# Patient Record
Sex: Female | Born: 1955 | Race: Black or African American | Hispanic: No | Smoking: Former smoker
Health system: Southern US, Community
[De-identification: ages and names within clinical notes are randomized; demographics above are authoritative.]

## PROBLEM LIST (undated history)

## (undated) DIAGNOSIS — R011 Cardiac murmur, unspecified: Secondary | ICD-10-CM

## (undated) DIAGNOSIS — E785 Hyperlipidemia, unspecified: Secondary | ICD-10-CM

## (undated) DIAGNOSIS — Z923 Personal history of irradiation: Secondary | ICD-10-CM

## (undated) DIAGNOSIS — Z86018 Personal history of other benign neoplasm: Secondary | ICD-10-CM

## (undated) DIAGNOSIS — Z9289 Personal history of other medical treatment: Secondary | ICD-10-CM

## (undated) DIAGNOSIS — N950 Postmenopausal bleeding: Secondary | ICD-10-CM

## (undated) DIAGNOSIS — R0602 Shortness of breath: Secondary | ICD-10-CM

## (undated) DIAGNOSIS — I10 Essential (primary) hypertension: Secondary | ICD-10-CM

## (undated) DIAGNOSIS — R5383 Other fatigue: Secondary | ICD-10-CM

## (undated) DIAGNOSIS — D649 Anemia, unspecified: Secondary | ICD-10-CM

## (undated) DIAGNOSIS — R002 Palpitations: Secondary | ICD-10-CM

## (undated) DIAGNOSIS — G4733 Obstructive sleep apnea (adult) (pediatric): Secondary | ICD-10-CM

## (undated) DIAGNOSIS — Z973 Presence of spectacles and contact lenses: Secondary | ICD-10-CM

## (undated) DIAGNOSIS — N840 Polyp of corpus uteri: Secondary | ICD-10-CM

## (undated) DIAGNOSIS — C801 Malignant (primary) neoplasm, unspecified: Secondary | ICD-10-CM

## (undated) DIAGNOSIS — M109 Gout, unspecified: Secondary | ICD-10-CM

## (undated) DIAGNOSIS — M2550 Pain in unspecified joint: Secondary | ICD-10-CM

## (undated) DIAGNOSIS — K589 Irritable bowel syndrome without diarrhea: Secondary | ICD-10-CM

## (undated) DIAGNOSIS — Z972 Presence of dental prosthetic device (complete) (partial): Secondary | ICD-10-CM

## (undated) DIAGNOSIS — C541 Malignant neoplasm of endometrium: Secondary | ICD-10-CM

## (undated) DIAGNOSIS — M1990 Unspecified osteoarthritis, unspecified site: Secondary | ICD-10-CM

## (undated) DIAGNOSIS — Z9221 Personal history of antineoplastic chemotherapy: Secondary | ICD-10-CM

## (undated) DIAGNOSIS — K219 Gastro-esophageal reflux disease without esophagitis: Secondary | ICD-10-CM

## (undated) DIAGNOSIS — E039 Hypothyroidism, unspecified: Secondary | ICD-10-CM

## (undated) DIAGNOSIS — E0789 Other specified disorders of thyroid: Secondary | ICD-10-CM

## (undated) DIAGNOSIS — M549 Dorsalgia, unspecified: Secondary | ICD-10-CM

## (undated) HISTORY — DX: Malignant neoplasm of endometrium: C54.1

## (undated) HISTORY — DX: Other specified disorders of thyroid: E07.89

## (undated) HISTORY — DX: Hyperlipidemia, unspecified: E78.5

## (undated) HISTORY — DX: Pain in unspecified joint: M25.50

## (undated) HISTORY — DX: Shortness of breath: R06.02

## (undated) HISTORY — PX: TUBAL LIGATION: SHX77

## (undated) HISTORY — DX: Anemia, unspecified: D64.9

## (undated) HISTORY — PX: DILATATION & CURRETTAGE/HYSTEROSCOPY WITH RESECTOCOPE: SHX5572

## (undated) HISTORY — PX: BREAST EXCISIONAL BIOPSY: SUR124

## (undated) HISTORY — DX: Essential (primary) hypertension: I10

## (undated) HISTORY — DX: Other fatigue: R53.83

## (undated) HISTORY — DX: Irritable bowel syndrome, unspecified: K58.9

## (undated) HISTORY — DX: Unspecified osteoarthritis, unspecified site: M19.90

## (undated) HISTORY — DX: Dorsalgia, unspecified: M54.9

## (undated) HISTORY — DX: Personal history of irradiation: Z92.3

## (undated) HISTORY — PX: ABDOMINAL HYSTERECTOMY: SHX81

## (undated) HISTORY — DX: Hypothyroidism, unspecified: E03.9

## (undated) HISTORY — PX: KNEE ARTHROSCOPY W/ MENISCECTOMY: SHX1879

## (undated) HISTORY — PX: CHOLECYSTECTOMY: SHX55

## (undated) HISTORY — PX: COLONOSCOPY: SHX174

## (undated) HISTORY — DX: Personal history of other medical treatment: Z92.89

## (undated) HISTORY — PX: OTHER SURGICAL HISTORY: SHX169

## (undated) HISTORY — PX: ESOPHAGOGASTRODUODENOSCOPY: SHX1529

---

## 1997-04-19 ENCOUNTER — Encounter: Admission: RE | Admit: 1997-04-19 | Discharge: 1997-04-19 | Payer: Self-pay | Admitting: Obstetrics

## 1997-05-01 ENCOUNTER — Encounter: Admission: RE | Admit: 1997-05-01 | Discharge: 1997-05-01 | Payer: Self-pay | Admitting: Obstetrics & Gynecology

## 1997-06-01 ENCOUNTER — Ambulatory Visit: Admission: RE | Admit: 1997-06-01 | Discharge: 1997-06-01 | Payer: Self-pay | Admitting: Obstetrics & Gynecology

## 1997-06-03 ENCOUNTER — Emergency Department (HOSPITAL_COMMUNITY): Admission: EM | Admit: 1997-06-03 | Discharge: 1997-06-03 | Payer: Self-pay | Admitting: Emergency Medicine

## 1998-05-17 ENCOUNTER — Emergency Department (HOSPITAL_COMMUNITY): Admission: EM | Admit: 1998-05-17 | Discharge: 1998-05-17 | Payer: Self-pay | Admitting: Emergency Medicine

## 1998-10-10 ENCOUNTER — Encounter: Admission: RE | Admit: 1998-10-10 | Discharge: 1998-10-10 | Payer: Self-pay | Admitting: Internal Medicine

## 2000-11-11 ENCOUNTER — Encounter: Payer: Self-pay | Admitting: *Deleted

## 2000-11-11 ENCOUNTER — Encounter: Admission: RE | Admit: 2000-11-11 | Discharge: 2000-11-11 | Payer: Self-pay | Admitting: *Deleted

## 2001-01-11 ENCOUNTER — Ambulatory Visit (HOSPITAL_COMMUNITY): Admission: RE | Admit: 2001-01-11 | Discharge: 2001-01-11 | Payer: Self-pay | Admitting: *Deleted

## 2001-06-14 ENCOUNTER — Emergency Department (HOSPITAL_COMMUNITY): Admission: EM | Admit: 2001-06-14 | Discharge: 2001-06-14 | Payer: Self-pay | Admitting: Emergency Medicine

## 2001-06-14 ENCOUNTER — Encounter: Payer: Self-pay | Admitting: Emergency Medicine

## 2002-04-06 ENCOUNTER — Encounter (HOSPITAL_COMMUNITY): Admission: RE | Admit: 2002-04-06 | Discharge: 2002-04-06 | Payer: Self-pay | Admitting: Orthopedic Surgery

## 2002-04-28 ENCOUNTER — Encounter: Payer: Self-pay | Admitting: Cardiology

## 2002-04-28 ENCOUNTER — Ambulatory Visit (HOSPITAL_COMMUNITY): Admission: RE | Admit: 2002-04-28 | Discharge: 2002-04-28 | Payer: Self-pay | Admitting: Family Medicine

## 2002-05-12 ENCOUNTER — Encounter: Payer: Self-pay | Admitting: Family Medicine

## 2002-05-12 ENCOUNTER — Ambulatory Visit (HOSPITAL_COMMUNITY): Admission: RE | Admit: 2002-05-12 | Discharge: 2002-05-12 | Payer: Self-pay | Admitting: Family Medicine

## 2002-12-13 ENCOUNTER — Ambulatory Visit (HOSPITAL_COMMUNITY): Admission: RE | Admit: 2002-12-13 | Discharge: 2002-12-13 | Payer: Self-pay | Admitting: Obstetrics and Gynecology

## 2002-12-13 ENCOUNTER — Encounter (INDEPENDENT_AMBULATORY_CARE_PROVIDER_SITE_OTHER): Payer: Self-pay

## 2003-03-02 ENCOUNTER — Encounter: Admission: RE | Admit: 2003-03-02 | Discharge: 2003-03-02 | Payer: Self-pay | Admitting: Internal Medicine

## 2003-08-10 ENCOUNTER — Encounter: Admission: RE | Admit: 2003-08-10 | Discharge: 2003-08-10 | Payer: Self-pay | Admitting: Internal Medicine

## 2003-10-26 ENCOUNTER — Ambulatory Visit: Payer: Self-pay | Admitting: Internal Medicine

## 2004-02-01 ENCOUNTER — Ambulatory Visit: Payer: Self-pay | Admitting: Internal Medicine

## 2004-02-27 ENCOUNTER — Emergency Department (HOSPITAL_COMMUNITY): Admission: EM | Admit: 2004-02-27 | Discharge: 2004-02-27 | Payer: Self-pay | Admitting: Emergency Medicine

## 2004-03-19 ENCOUNTER — Ambulatory Visit: Payer: Self-pay | Admitting: Internal Medicine

## 2004-06-11 ENCOUNTER — Other Ambulatory Visit: Admission: RE | Admit: 2004-06-11 | Discharge: 2004-06-11 | Payer: Self-pay | Admitting: Obstetrics and Gynecology

## 2004-09-26 ENCOUNTER — Encounter (INDEPENDENT_AMBULATORY_CARE_PROVIDER_SITE_OTHER): Payer: Self-pay | Admitting: Specialist

## 2004-09-26 ENCOUNTER — Ambulatory Visit (HOSPITAL_COMMUNITY): Admission: RE | Admit: 2004-09-26 | Discharge: 2004-09-26 | Payer: Self-pay | Admitting: Obstetrics and Gynecology

## 2004-10-17 ENCOUNTER — Emergency Department (HOSPITAL_COMMUNITY): Admission: EM | Admit: 2004-10-17 | Discharge: 2004-10-17 | Payer: Self-pay | Admitting: Emergency Medicine

## 2005-04-03 ENCOUNTER — Ambulatory Visit: Payer: Self-pay | Admitting: Internal Medicine

## 2005-04-07 ENCOUNTER — Ambulatory Visit: Payer: Self-pay | Admitting: Internal Medicine

## 2005-05-14 ENCOUNTER — Ambulatory Visit: Payer: Self-pay | Admitting: Internal Medicine

## 2005-05-19 ENCOUNTER — Ambulatory Visit: Payer: Self-pay | Admitting: Internal Medicine

## 2005-05-23 ENCOUNTER — Emergency Department (HOSPITAL_COMMUNITY): Admission: EM | Admit: 2005-05-23 | Discharge: 2005-05-24 | Payer: Self-pay | Admitting: Emergency Medicine

## 2005-05-26 ENCOUNTER — Ambulatory Visit: Payer: Self-pay | Admitting: Internal Medicine

## 2005-06-02 ENCOUNTER — Ambulatory Visit (HOSPITAL_COMMUNITY): Admission: RE | Admit: 2005-06-02 | Discharge: 2005-06-02 | Payer: Self-pay | Admitting: Internal Medicine

## 2005-06-20 ENCOUNTER — Ambulatory Visit (HOSPITAL_BASED_OUTPATIENT_CLINIC_OR_DEPARTMENT_OTHER): Admission: RE | Admit: 2005-06-20 | Discharge: 2005-06-20 | Payer: Self-pay | Admitting: Internal Medicine

## 2005-06-21 ENCOUNTER — Ambulatory Visit: Payer: Self-pay | Admitting: Internal Medicine

## 2005-06-21 DIAGNOSIS — G4733 Obstructive sleep apnea (adult) (pediatric): Secondary | ICD-10-CM | POA: Insufficient documentation

## 2005-07-09 ENCOUNTER — Ambulatory Visit: Payer: Self-pay | Admitting: Internal Medicine

## 2005-10-09 ENCOUNTER — Ambulatory Visit: Payer: Self-pay | Admitting: Internal Medicine

## 2005-10-23 ENCOUNTER — Ambulatory Visit: Payer: Self-pay | Admitting: Internal Medicine

## 2005-10-23 ENCOUNTER — Encounter (INDEPENDENT_AMBULATORY_CARE_PROVIDER_SITE_OTHER): Payer: Self-pay | Admitting: Internal Medicine

## 2005-10-23 ENCOUNTER — Ambulatory Visit (HOSPITAL_COMMUNITY): Admission: RE | Admit: 2005-10-23 | Discharge: 2005-10-23 | Payer: Self-pay | Admitting: Internal Medicine

## 2005-10-23 LAB — CONVERTED CEMR LAB: Hep B S Ab: NEGATIVE

## 2005-11-13 ENCOUNTER — Encounter: Admission: RE | Admit: 2005-11-13 | Discharge: 2006-02-11 | Payer: Self-pay | Admitting: Internal Medicine

## 2005-12-28 ENCOUNTER — Ambulatory Visit: Payer: Self-pay | Admitting: Internal Medicine

## 2006-01-01 ENCOUNTER — Ambulatory Visit: Payer: Self-pay | Admitting: Internal Medicine

## 2006-01-01 LAB — CONVERTED CEMR LAB
BUN: 18 mg/dL (ref 6–23)
CO2: 28 meq/L (ref 19–32)
Chloride: 100 meq/L (ref 96–112)
Creatinine, Ser: 0.9 mg/dL (ref 0.40–1.20)

## 2006-02-17 ENCOUNTER — Emergency Department (HOSPITAL_COMMUNITY): Admission: EM | Admit: 2006-02-17 | Discharge: 2006-02-17 | Payer: Self-pay | Admitting: Emergency Medicine

## 2006-02-17 DIAGNOSIS — I10 Essential (primary) hypertension: Secondary | ICD-10-CM | POA: Insufficient documentation

## 2006-02-17 DIAGNOSIS — E785 Hyperlipidemia, unspecified: Secondary | ICD-10-CM | POA: Insufficient documentation

## 2006-02-17 DIAGNOSIS — E039 Hypothyroidism, unspecified: Secondary | ICD-10-CM | POA: Insufficient documentation

## 2006-02-19 ENCOUNTER — Ambulatory Visit: Payer: Self-pay | Admitting: Internal Medicine

## 2006-02-19 DIAGNOSIS — K219 Gastro-esophageal reflux disease without esophagitis: Secondary | ICD-10-CM | POA: Insufficient documentation

## 2006-02-26 ENCOUNTER — Telehealth: Payer: Self-pay | Admitting: *Deleted

## 2006-03-05 ENCOUNTER — Ambulatory Visit: Payer: Self-pay | Admitting: Internal Medicine

## 2006-03-05 ENCOUNTER — Encounter (INDEPENDENT_AMBULATORY_CARE_PROVIDER_SITE_OTHER): Payer: Self-pay | Admitting: Unknown Physician Specialty

## 2006-03-05 LAB — CONVERTED CEMR LAB
BUN: 13 mg/dL (ref 6–23)
Creatinine, Ser: 0.75 mg/dL (ref 0.40–1.20)
Potassium: 3.2 meq/L — ABNORMAL LOW (ref 3.5–5.3)

## 2006-03-06 ENCOUNTER — Encounter (INDEPENDENT_AMBULATORY_CARE_PROVIDER_SITE_OTHER): Payer: Self-pay | Admitting: Unknown Physician Specialty

## 2006-03-06 ENCOUNTER — Ambulatory Visit (HOSPITAL_COMMUNITY): Admission: RE | Admit: 2006-03-06 | Discharge: 2006-03-06 | Payer: Self-pay | Admitting: Internal Medicine

## 2006-03-09 ENCOUNTER — Ambulatory Visit: Payer: Self-pay | Admitting: Internal Medicine

## 2006-03-09 DIAGNOSIS — J301 Allergic rhinitis due to pollen: Secondary | ICD-10-CM | POA: Insufficient documentation

## 2006-03-11 ENCOUNTER — Encounter (INDEPENDENT_AMBULATORY_CARE_PROVIDER_SITE_OTHER): Payer: Self-pay | Admitting: *Deleted

## 2006-04-12 ENCOUNTER — Ambulatory Visit (HOSPITAL_BASED_OUTPATIENT_CLINIC_OR_DEPARTMENT_OTHER): Admission: RE | Admit: 2006-04-12 | Discharge: 2006-04-12 | Payer: Self-pay | Admitting: Orthopedic Surgery

## 2006-04-14 ENCOUNTER — Telehealth: Payer: Self-pay | Admitting: *Deleted

## 2006-05-04 ENCOUNTER — Telehealth: Payer: Self-pay | Admitting: *Deleted

## 2006-05-12 ENCOUNTER — Telehealth (INDEPENDENT_AMBULATORY_CARE_PROVIDER_SITE_OTHER): Payer: Self-pay | Admitting: *Deleted

## 2006-05-12 ENCOUNTER — Encounter (INDEPENDENT_AMBULATORY_CARE_PROVIDER_SITE_OTHER): Payer: Self-pay | Admitting: *Deleted

## 2006-05-12 ENCOUNTER — Ambulatory Visit: Payer: Self-pay | Admitting: *Deleted

## 2006-05-13 ENCOUNTER — Telehealth (INDEPENDENT_AMBULATORY_CARE_PROVIDER_SITE_OTHER): Payer: Self-pay | Admitting: *Deleted

## 2006-06-08 LAB — CONVERTED CEMR LAB
AST: 21 units/L (ref 0–37)
Albumin: 4.3 g/dL (ref 3.5–5.2)
Alkaline Phosphatase: 94 units/L (ref 39–117)
LDL Cholesterol: 152 mg/dL — ABNORMAL HIGH (ref 0–99)
Potassium: 3.2 meq/L — ABNORMAL LOW (ref 3.5–5.3)
Sodium: 140 meq/L (ref 135–145)
TSH: 2.591 microintl units/mL (ref 0.350–5.50)
Total Protein: 7.7 g/dL (ref 6.0–8.3)

## 2006-06-14 ENCOUNTER — Telehealth: Payer: Self-pay | Admitting: *Deleted

## 2006-07-14 ENCOUNTER — Telehealth: Payer: Self-pay | Admitting: *Deleted

## 2006-07-26 ENCOUNTER — Telehealth (INDEPENDENT_AMBULATORY_CARE_PROVIDER_SITE_OTHER): Payer: Self-pay | Admitting: *Deleted

## 2006-08-06 ENCOUNTER — Ambulatory Visit: Payer: Self-pay | Admitting: Internal Medicine

## 2006-08-06 ENCOUNTER — Encounter (INDEPENDENT_AMBULATORY_CARE_PROVIDER_SITE_OTHER): Payer: Self-pay | Admitting: *Deleted

## 2006-08-06 LAB — CONVERTED CEMR LAB
Chloride: 98 meq/L (ref 96–112)
Potassium: 3.4 meq/L — ABNORMAL LOW (ref 3.5–5.3)

## 2006-08-13 ENCOUNTER — Ambulatory Visit: Payer: Self-pay | Admitting: *Deleted

## 2006-09-14 ENCOUNTER — Telehealth (INDEPENDENT_AMBULATORY_CARE_PROVIDER_SITE_OTHER): Payer: Self-pay | Admitting: *Deleted

## 2006-09-21 ENCOUNTER — Ambulatory Visit: Payer: Self-pay | Admitting: Internal Medicine

## 2006-09-21 ENCOUNTER — Encounter (INDEPENDENT_AMBULATORY_CARE_PROVIDER_SITE_OTHER): Payer: Self-pay | Admitting: Internal Medicine

## 2006-09-23 ENCOUNTER — Encounter: Payer: Self-pay | Admitting: *Deleted

## 2006-09-23 ENCOUNTER — Telehealth: Payer: Self-pay | Admitting: *Deleted

## 2006-09-23 LAB — CONVERTED CEMR LAB
BUN: 18 mg/dL (ref 6–23)
Glucose, Bld: 107 mg/dL — ABNORMAL HIGH (ref 70–99)
Hemoglobin: 13.4 g/dL (ref 12.0–15.0)
MCHC: 32.3 g/dL (ref 30.0–36.0)
MCV: 86.6 fL (ref 78.0–100.0)
Potassium: 3.3 meq/L — ABNORMAL LOW (ref 3.5–5.3)
RBC: 4.79 M/uL (ref 3.87–5.11)

## 2006-09-24 ENCOUNTER — Ambulatory Visit (HOSPITAL_COMMUNITY): Admission: RE | Admit: 2006-09-24 | Discharge: 2006-09-24 | Payer: Self-pay | Admitting: Internal Medicine

## 2006-09-24 ENCOUNTER — Telehealth: Payer: Self-pay | Admitting: *Deleted

## 2006-09-24 ENCOUNTER — Encounter (INDEPENDENT_AMBULATORY_CARE_PROVIDER_SITE_OTHER): Payer: Self-pay | Admitting: *Deleted

## 2006-10-01 ENCOUNTER — Encounter (INDEPENDENT_AMBULATORY_CARE_PROVIDER_SITE_OTHER): Payer: Self-pay | Admitting: Internal Medicine

## 2006-10-01 ENCOUNTER — Ambulatory Visit: Payer: Self-pay | Admitting: Hospitalist

## 2006-10-01 LAB — CONVERTED CEMR LAB
Calcium: 9.8 mg/dL (ref 8.4–10.5)
Glucose, Bld: 113 mg/dL — ABNORMAL HIGH (ref 70–99)
Sodium: 139 meq/L (ref 135–145)

## 2006-10-12 ENCOUNTER — Emergency Department (HOSPITAL_COMMUNITY): Admission: EM | Admit: 2006-10-12 | Discharge: 2006-10-13 | Payer: Self-pay | Admitting: *Deleted

## 2006-10-22 ENCOUNTER — Ambulatory Visit (HOSPITAL_COMMUNITY): Admission: RE | Admit: 2006-10-22 | Discharge: 2006-10-22 | Payer: Self-pay | Admitting: *Deleted

## 2006-10-22 ENCOUNTER — Encounter (INDEPENDENT_AMBULATORY_CARE_PROVIDER_SITE_OTHER): Payer: Self-pay | Admitting: *Deleted

## 2006-12-29 ENCOUNTER — Ambulatory Visit (HOSPITAL_BASED_OUTPATIENT_CLINIC_OR_DEPARTMENT_OTHER): Admission: RE | Admit: 2006-12-29 | Discharge: 2006-12-29 | Payer: Self-pay | Admitting: Orthopedic Surgery

## 2007-01-25 ENCOUNTER — Telehealth (INDEPENDENT_AMBULATORY_CARE_PROVIDER_SITE_OTHER): Payer: Self-pay | Admitting: *Deleted

## 2007-02-21 ENCOUNTER — Telehealth (INDEPENDENT_AMBULATORY_CARE_PROVIDER_SITE_OTHER): Payer: Self-pay | Admitting: *Deleted

## 2007-03-18 ENCOUNTER — Ambulatory Visit: Payer: Self-pay | Admitting: Hospitalist

## 2007-04-06 ENCOUNTER — Ambulatory Visit: Payer: Self-pay | Admitting: Hospitalist

## 2007-04-29 ENCOUNTER — Encounter: Admission: RE | Admit: 2007-04-29 | Discharge: 2007-04-29 | Payer: Self-pay | Admitting: *Deleted

## 2007-04-29 ENCOUNTER — Encounter (INDEPENDENT_AMBULATORY_CARE_PROVIDER_SITE_OTHER): Payer: Self-pay | Admitting: Family Medicine

## 2007-05-02 ENCOUNTER — Encounter (INDEPENDENT_AMBULATORY_CARE_PROVIDER_SITE_OTHER): Payer: Self-pay | Admitting: *Deleted

## 2007-05-02 ENCOUNTER — Ambulatory Visit: Payer: Self-pay | Admitting: Hospitalist

## 2007-05-22 LAB — CONVERTED CEMR LAB
ALT: 27 units/L (ref 0–35)
AST: 19 units/L (ref 0–37)
Alkaline Phosphatase: 109 units/L (ref 39–117)
CO2: 24 meq/L (ref 19–32)
Cholesterol: 268 mg/dL — ABNORMAL HIGH (ref 0–200)
LDL Cholesterol: 173 mg/dL — ABNORMAL HIGH (ref 0–99)
Sodium: 142 meq/L (ref 135–145)
Total Bilirubin: 0.6 mg/dL (ref 0.3–1.2)
Total Protein: 7.5 g/dL (ref 6.0–8.3)
VLDL: 19 mg/dL (ref 0–40)

## 2007-05-31 ENCOUNTER — Telehealth (INDEPENDENT_AMBULATORY_CARE_PROVIDER_SITE_OTHER): Payer: Self-pay | Admitting: *Deleted

## 2007-06-21 ENCOUNTER — Telehealth (INDEPENDENT_AMBULATORY_CARE_PROVIDER_SITE_OTHER): Payer: Self-pay | Admitting: *Deleted

## 2007-06-23 ENCOUNTER — Encounter (INDEPENDENT_AMBULATORY_CARE_PROVIDER_SITE_OTHER): Payer: Self-pay | Admitting: *Deleted

## 2007-06-24 ENCOUNTER — Encounter (INDEPENDENT_AMBULATORY_CARE_PROVIDER_SITE_OTHER): Payer: Self-pay | Admitting: *Deleted

## 2007-06-24 ENCOUNTER — Ambulatory Visit: Payer: Self-pay | Admitting: Internal Medicine

## 2007-06-29 ENCOUNTER — Telehealth: Payer: Self-pay | Admitting: *Deleted

## 2007-06-29 LAB — CONVERTED CEMR LAB
Ferritin: 52 ng/mL (ref 10–291)
Iron: 81 ug/dL (ref 42–145)
MCHC: 32 g/dL (ref 30.0–36.0)
Platelets: 249 10*3/uL (ref 150–400)
RDW: 14.6 % (ref 11.5–15.5)
TIBC: 362 ug/dL (ref 250–470)
UIBC: 281 ug/dL

## 2007-07-08 ENCOUNTER — Encounter: Admission: RE | Admit: 2007-07-08 | Discharge: 2007-10-04 | Payer: Self-pay | Admitting: Orthopedic Surgery

## 2007-07-12 ENCOUNTER — Telehealth (INDEPENDENT_AMBULATORY_CARE_PROVIDER_SITE_OTHER): Payer: Self-pay | Admitting: *Deleted

## 2007-08-08 ENCOUNTER — Encounter (INDEPENDENT_AMBULATORY_CARE_PROVIDER_SITE_OTHER): Payer: Self-pay | Admitting: *Deleted

## 2007-08-08 ENCOUNTER — Ambulatory Visit: Payer: Self-pay | Admitting: Internal Medicine

## 2007-08-09 LAB — CONVERTED CEMR LAB: TSH: 3.642 microintl units/mL (ref 0.350–4.50)

## 2007-11-03 ENCOUNTER — Encounter (INDEPENDENT_AMBULATORY_CARE_PROVIDER_SITE_OTHER): Payer: Self-pay | Admitting: *Deleted

## 2007-11-03 ENCOUNTER — Ambulatory Visit (HOSPITAL_COMMUNITY): Admission: RE | Admit: 2007-11-03 | Discharge: 2007-11-03 | Payer: Self-pay | Admitting: *Deleted

## 2007-11-24 ENCOUNTER — Encounter (INDEPENDENT_AMBULATORY_CARE_PROVIDER_SITE_OTHER): Payer: Self-pay | Admitting: *Deleted

## 2007-11-24 ENCOUNTER — Ambulatory Visit: Payer: Self-pay | Admitting: *Deleted

## 2007-11-24 DIAGNOSIS — N959 Unspecified menopausal and perimenopausal disorder: Secondary | ICD-10-CM | POA: Insufficient documentation

## 2007-11-24 DIAGNOSIS — M109 Gout, unspecified: Secondary | ICD-10-CM | POA: Insufficient documentation

## 2007-11-25 LAB — CONVERTED CEMR LAB
AST: 19 units/L (ref 0–37)
Alkaline Phosphatase: 111 units/L (ref 39–117)
BUN: 15 mg/dL (ref 6–23)
Creatinine, Ser: 0.71 mg/dL (ref 0.40–1.20)
Glucose, Bld: 110 mg/dL — ABNORMAL HIGH (ref 70–99)
HDL: 85 mg/dL (ref >39)
LDL Cholesterol: 170 mg/dL — ABNORMAL HIGH (ref 0–99)
TSH: 4.479 microintl units/mL (ref 0.350–4.50)
Total CHOL/HDL Ratio: 3.2
Triglycerides: 103 mg/dL (ref <150)

## 2008-01-21 ENCOUNTER — Encounter: Admission: RE | Admit: 2008-01-21 | Discharge: 2008-01-21 | Payer: Self-pay | Admitting: Orthopedic Surgery

## 2008-02-08 ENCOUNTER — Encounter (INDEPENDENT_AMBULATORY_CARE_PROVIDER_SITE_OTHER): Payer: Self-pay | Admitting: *Deleted

## 2008-02-08 ENCOUNTER — Encounter: Payer: Self-pay | Admitting: Internal Medicine

## 2008-02-08 ENCOUNTER — Ambulatory Visit: Payer: Self-pay | Admitting: Internal Medicine

## 2008-02-15 ENCOUNTER — Encounter: Payer: Self-pay | Admitting: Internal Medicine

## 2008-02-16 ENCOUNTER — Encounter (INDEPENDENT_AMBULATORY_CARE_PROVIDER_SITE_OTHER): Payer: Self-pay | Admitting: *Deleted

## 2008-02-16 LAB — CONVERTED CEMR LAB
ALT: 35 units/L (ref 0–35)
AST: 30 units/L (ref 0–37)
Albumin: 4.7 g/dL (ref 3.5–5.2)
Alkaline Phosphatase: 99 units/L (ref 39–117)
BUN: 17 mg/dL (ref 6–23)
CO2: 25 meq/L (ref 19–32)
Calcium: 9.8 mg/dL (ref 8.4–10.5)
Chloride: 103 meq/L (ref 96–112)
Cholesterol: 244 mg/dL — ABNORMAL HIGH (ref 0–200)
Creatinine, Ser: 0.64 mg/dL (ref 0.40–1.20)
Free T4: 1.14 ng/dL (ref 0.89–1.80)
Glucose, Bld: 118 mg/dL — ABNORMAL HIGH (ref 70–99)
HCT: 41.8 % (ref 36.0–46.0)
HDL: 72 mg/dL (ref >39)
Hemoglobin: 13.6 g/dL (ref 12.0–15.0)
LDL Cholesterol: 146 mg/dL — ABNORMAL HIGH (ref 0–99)
MCHC: 32.5 g/dL (ref 30.0–36.0)
MCV: 86.7 fL (ref 78.0–100.0)
Platelets: 247 10*3/uL (ref 150–400)
Potassium: 4 meq/L (ref 3.5–5.3)
RBC: 4.82 M/uL (ref 3.87–5.11)
RDW: 14.5 % (ref 11.5–15.5)
Sodium: 143 meq/L (ref 135–145)
TSH: 3.731 microintl units/mL (ref 0.350–4.50)
Total Bilirubin: 0.6 mg/dL (ref 0.3–1.2)
Total CHOL/HDL Ratio: 3.4
Total Protein: 7.8 g/dL (ref 6.0–8.3)
Triglycerides: 128 mg/dL (ref <150)
VLDL: 26 mg/dL (ref 0–40)
WBC: 6.8 10*3/uL (ref 4.0–10.5)

## 2008-02-27 DIAGNOSIS — M545 Low back pain, unspecified: Secondary | ICD-10-CM | POA: Insufficient documentation

## 2008-04-06 ENCOUNTER — Encounter (INDEPENDENT_AMBULATORY_CARE_PROVIDER_SITE_OTHER): Payer: Self-pay | Admitting: *Deleted

## 2008-04-13 ENCOUNTER — Ambulatory Visit (HOSPITAL_BASED_OUTPATIENT_CLINIC_OR_DEPARTMENT_OTHER): Admission: RE | Admit: 2008-04-13 | Discharge: 2008-04-13 | Payer: Self-pay | Admitting: *Deleted

## 2008-04-13 ENCOUNTER — Encounter (INDEPENDENT_AMBULATORY_CARE_PROVIDER_SITE_OTHER): Payer: Self-pay | Admitting: *Deleted

## 2008-04-19 ENCOUNTER — Telehealth (INDEPENDENT_AMBULATORY_CARE_PROVIDER_SITE_OTHER): Payer: Self-pay | Admitting: *Deleted

## 2008-04-21 ENCOUNTER — Ambulatory Visit: Payer: Self-pay | Admitting: Internal Medicine

## 2008-06-18 ENCOUNTER — Telehealth (INDEPENDENT_AMBULATORY_CARE_PROVIDER_SITE_OTHER): Payer: Self-pay | Admitting: *Deleted

## 2008-07-20 ENCOUNTER — Emergency Department (HOSPITAL_COMMUNITY): Admission: EM | Admit: 2008-07-20 | Discharge: 2008-07-20 | Payer: Self-pay | Admitting: Emergency Medicine

## 2008-08-06 ENCOUNTER — Encounter (INDEPENDENT_AMBULATORY_CARE_PROVIDER_SITE_OTHER): Payer: Self-pay | Admitting: Internal Medicine

## 2008-09-24 ENCOUNTER — Telehealth: Payer: Self-pay | Admitting: Infectious Disease

## 2009-02-11 ENCOUNTER — Telehealth (INDEPENDENT_AMBULATORY_CARE_PROVIDER_SITE_OTHER): Payer: Self-pay | Admitting: *Deleted

## 2009-02-19 ENCOUNTER — Ambulatory Visit: Payer: Self-pay | Admitting: Internal Medicine

## 2009-02-19 DIAGNOSIS — K047 Periapical abscess without sinus: Secondary | ICD-10-CM | POA: Insufficient documentation

## 2009-02-19 LAB — CONVERTED CEMR LAB
ALT: 22 units/L (ref 0–35)
AST: 19 units/L (ref 0–37)
Alkaline Phosphatase: 106 units/L (ref 39–117)
BUN: 14 mg/dL (ref 6–23)
Calcium: 10.4 mg/dL (ref 8.4–10.5)
Chloride: 103 meq/L (ref 96–112)
Creatinine, Ser: 0.65 mg/dL (ref 0.40–1.20)
HDL: 66 mg/dL (ref >39)
LDL Cholesterol: 151 mg/dL — ABNORMAL HIGH (ref 0–99)
TSH: 2.56 microintl units/mL (ref 0.350–4.5)
Total Bilirubin: 0.6 mg/dL (ref 0.3–1.2)
Total CHOL/HDL Ratio: 3.5
Uric Acid, Serum: 7 mg/dL (ref 2.4–7.0)
VLDL: 15 mg/dL (ref 0–40)

## 2009-03-14 ENCOUNTER — Telehealth: Payer: Self-pay | Admitting: Internal Medicine

## 2009-03-18 ENCOUNTER — Encounter (INDEPENDENT_AMBULATORY_CARE_PROVIDER_SITE_OTHER): Payer: Self-pay | Admitting: Internal Medicine

## 2009-03-18 ENCOUNTER — Encounter: Payer: Self-pay | Admitting: Internal Medicine

## 2009-03-25 ENCOUNTER — Ambulatory Visit: Payer: Self-pay | Admitting: Infectious Diseases

## 2009-03-25 LAB — CONVERTED CEMR LAB
BUN: 15 mg/dL (ref 6–23)
Calcium: 10.1 mg/dL (ref 8.4–10.5)
Chloride: 100 meq/L (ref 96–112)
Creatinine, Ser: 0.76 mg/dL (ref 0.40–1.20)

## 2009-03-26 ENCOUNTER — Telehealth: Payer: Self-pay | Admitting: *Deleted

## 2009-04-11 ENCOUNTER — Telehealth: Payer: Self-pay | Admitting: *Deleted

## 2009-04-25 ENCOUNTER — Ambulatory Visit: Payer: Self-pay | Admitting: Internal Medicine

## 2009-04-25 DIAGNOSIS — R42 Dizziness and giddiness: Secondary | ICD-10-CM | POA: Insufficient documentation

## 2009-04-26 ENCOUNTER — Encounter: Payer: Self-pay | Admitting: Internal Medicine

## 2009-04-26 LAB — CONVERTED CEMR LAB
ALT: 30 units/L (ref 0–35)
AST: 28 units/L (ref 0–37)
Albumin: 4.6 g/dL (ref 3.5–5.2)
Alkaline Phosphatase: 110 units/L (ref 39–117)
BUN: 14 mg/dL (ref 6–23)
Chloride: 101 meq/L (ref 96–112)
HDL: 64 mg/dL (ref >39)
LDL Cholesterol: 112 mg/dL — ABNORMAL HIGH (ref 0–99)
Potassium: 3.5 meq/L (ref 3.5–5.3)
Total CHOL/HDL Ratio: 3

## 2009-05-06 ENCOUNTER — Telehealth (INDEPENDENT_AMBULATORY_CARE_PROVIDER_SITE_OTHER): Payer: Self-pay | Admitting: Internal Medicine

## 2009-06-28 ENCOUNTER — Ambulatory Visit: Payer: Self-pay | Admitting: Internal Medicine

## 2009-07-12 ENCOUNTER — Ambulatory Visit: Payer: Self-pay | Admitting: Family Medicine

## 2009-11-28 ENCOUNTER — Telehealth: Payer: Self-pay | Admitting: Internal Medicine

## 2010-01-13 ENCOUNTER — Telehealth: Payer: Self-pay | Admitting: Internal Medicine

## 2010-02-06 ENCOUNTER — Encounter
Admission: RE | Admit: 2010-02-06 | Discharge: 2010-02-11 | Payer: Self-pay | Source: Home / Self Care | Attending: Orthopedic Surgery | Admitting: Orthopedic Surgery

## 2010-02-10 ENCOUNTER — Ambulatory Visit: Admit: 2010-02-10 | Payer: Self-pay

## 2010-02-12 ENCOUNTER — Ambulatory Visit: Payer: BC Managed Care – PPO | Attending: Orthopedic Surgery

## 2010-02-12 DIAGNOSIS — R262 Difficulty in walking, not elsewhere classified: Secondary | ICD-10-CM | POA: Insufficient documentation

## 2010-02-12 DIAGNOSIS — M25569 Pain in unspecified knee: Secondary | ICD-10-CM | POA: Insufficient documentation

## 2010-02-12 DIAGNOSIS — M25669 Stiffness of unspecified knee, not elsewhere classified: Secondary | ICD-10-CM | POA: Insufficient documentation

## 2010-02-12 DIAGNOSIS — IMO0001 Reserved for inherently not codable concepts without codable children: Secondary | ICD-10-CM | POA: Insufficient documentation

## 2010-02-13 NOTE — Progress Notes (Signed)
Summary: Refill/gh  Phone Note Refill Request Message from:  Fax from Pharmacy on January 13, 2010 2:14 PM  Refills Requested: Medication #1:  POTASSIUM CHLORIDE 20 MEQ PACK take 1 pack daily   Last Refilled: 12/02/2009 Last labs were 04/26/2009.   Last office visit was 06/28/2009.   Method Requested: Electronic Initial call taken by: Angelina Ok RN,  January 13, 2010 2:14 PM  Follow-up for Phone Call        Has 1/30 appt. Will refll Follow-up by: Blanch Media MD,  January 13, 2010 2:31 PM    Prescriptions: POTASSIUM CHLORIDE 20 MEQ PACK (POTASSIUM CHLORIDE) take 1 pack daily  #30 x 3   Entered and Authorized by:   Blanch Media MD   Signed by:   Blanch Media MD on 01/13/2010   Method used:   [facility] to        [id number]       [address]       [place], [place]  [address]       Ph: [phone] or [phone]       Fax: [phone]   RxID:   [id number]

## 2010-02-13 NOTE — Progress Notes (Signed)
Summary: Refill/gh  Phone Note Refill Request Message from:  Lydia Walsh  Refills Requested: Medication #1:  ATENOLOL 100 MG  TABS Take 1 tablet by mouth once a day  Medication #2:  AMLODIPINE BESYLATE 10 MG  TABS Take 1 tablet by mouth once a day Pt had last labs and visit on 04/25/2009.    Method Requested: Electronic Initial call taken by: Angelina Ok RN,  May 06, 2009 9:36 AM  Follow-up for Phone Call        send to PCP. Follow-up by: Ulyess Mort MD,  May 06, 2009 11:32 AM  Additional Follow-up for Phone Call Additional follow up Details #1::       Additional Follow-up by: Silvestre Gunner MD,  May 07, 2009 2:12 PM    Prescriptions: AMLODIPINE BESYLATE 10 MG  TABS (AMLODIPINE BESYLATE) Take 1 tablet by mouth once a day  #30 x 11   Entered and Authorized by:   Silvestre Gunner MD   Signed by:   Silvestre Gunner MD on 05/07/2009   Method used:   Electronically to        [facility] (retail)       [address]       [place], [place]  [address]       Ph: [phone]       Fax: [phone]   RxID:   [id number] ATENOLOL 100 MG  TABS (ATENOLOL) Take 1 tablet by mouth once a day  #30 x 11   Entered and Authorized by:   Silvestre Gunner MD   Signed by:   Silvestre Gunner MD on 05/07/2009   Method used:   Electronically to        [facility] (retail)       [address]       [place], [place]  [address]       Ph: [phone]       Fax: [phone]   RxID:   [id number]

## 2010-02-13 NOTE — Assessment & Plan Note (Signed)
Summary: NP,L KNEE PAIN ,MC   Vital Signs:  Patient profile:   55 year old female Height:      60 inches Weight:      180 pounds BP sitting:   120 / 77  Vitals Entered By: Lillia Pauls CMA (July 12, 2009 10:29 AM)  Primary Care Provider:  Olene Craven MD   History of Present Illness: 55 yo female with chronic left knee pain.  Pain present for multiple years.  Seen by Dr. Luiz Blare in 2005 and had arthroscopic knee surgery in the left knee.  Last seen by ortho in 2010 and recieved a shot in her knee.  Not returned to ortho since 2010.   Pain starts in the AM and gets better thruout the day.  Pain worsens when hse has been  sedentary and then starts moving.  Pain with going up stairs. pain 5-6/10 no locking or giving way denies swelling or warmth  Takes ibuprofen with some relief.  Allergies: 1)  ! Ace Inhibitors  Review of Systems  The patient denies weight gain.         Please see HPI for additional ROS.   Physical Exam  General:  alert and well-developed.   Msk:  FROM in extension and flexion B knees. Mild medial joint line tenderness. Negative McMurray. Ligamentously intact. no effusion, no warmth. calf is soft. distally neurovasculary intact   Impression & Recommendations:  Problem # 1:  KNEE PAIN (ICD-719.46)  Her updated medication list for this problem includes:    Ibuprofen 600 Mg Tabs (Ibuprofen) .Marland Kitchen... Take 600 mg every 8 hours for the next 5-7 days we discussd options. I reviewed her MRI of the other knee which was done in 2008--it showed significant tricompartmental arthritis--so I have to assume this knee is probably similar. She does not want to try another steroid injection as the last one (mant months ago) did not help significantly. She does want to try PT and we will set that up. For further options such as supartz, another arthroscope etc I woudl refer her back to orthopedics.  Complete Medication List: 1)  Crestor 20 Mg Tabs (Rosuvastatin calcium)  .... Take 1 tablet daily 2)  Atenolol 100 Mg Tabs (Atenolol) .... Take 1 tablet by mouth once a day 3)  Levothyroxine Sodium 100 Mcg Tabs (Levothyroxine sodium) .... Take 1 tablet by mouth once a day 4)  Amlodipine Besylate 10 Mg Tabs (Amlodipine besylate) .... Take 1 tablet by mouth once a day 5)  Voltaren 1 % Gel (Diclofenac sodium) .... Apply three times a day over your knees. 6)  Prevacid Solutab 30 Mg Tbdp (Lansoprazole) .... Take 1 tablet by mouth two times a day 7)  Furosemide 40 Mg Tabs (Furosemide) .... Take 1 tablet by mouth once a day 8)  Allopurinol 100 Mg Tabs (Allopurinol) .... Take 1 tablet by mouth once a day 9)  Potassium Chloride 20 Meq Pack (Potassium chloride) .... Take 1 pack daily 10)  Meclizine Hcl 25 Mg Tabs (Meclizine hcl) .... Take one tab up to 4 times per day as needed for dizziness 11)  Ibuprofen 600 Mg Tabs (Ibuprofen) .... Take 600 mg every 8 hours for the next 5-7 days  Patient Instructions: 1)  I will set you up for physical therapy. You should hear from them early next week 2)  If that does not make a significant difference in your knee pain, I would recommend you get back to see the orthopedist for a discussion of  supartz.

## 2010-02-13 NOTE — Letter (Signed)
Summary: [facility] referral form  [facility] referral form   Imported By: Marily Memos 07/23/2009 11:54:24  _____________________________________________________________________  External Attachment:    Type:   Image     Comment:   External Document

## 2010-02-13 NOTE — Medication Information (Signed)
Summary: [facility]: RX  [facility]: RX   Imported By: Florinda Marker 03/20/2009 11:00:38  _____________________________________________________________________  External Attachment:    Type:   Image     Comment:   External Document

## 2010-02-13 NOTE — Progress Notes (Signed)
Summary: refill/gg  Phone Note Refill Request  on November 28, 2009 11:29 AM  Refills Requested: Medication #1:  LEVOTHYROXINE SODIUM 100 MCG TABS Take 1 tablet by mouth once a day  Medication #2:  FUROSEMIDE 40 MG TABS Take 1 tablet by mouth once a day  Medication #3:  CRESTOR 20 MG TABS take 1 tablet daily last TSH 02/19/09   Method Requested: Electronic Initial call taken by: Merrie Roof RN,  November 28, 2009 11:30 AM  Follow-up for Phone Call        Was seen June and 3 month F/U requested but not sch. I sent a flag to Ms Lissa Hoard to schedule an appt. Will refill to get her by until appt Follow-up by: Blanch Media MD,  November 28, 2009 11:55 AM    Prescriptions: FUROSEMIDE 40 MG TABS (FUROSEMIDE) Take 1 tablet by mouth once a day  #31 x 2   Entered and Authorized by:   Blanch Media MD   Signed by:   Blanch Media MD on 11/28/2009   Method used:   Electronically to        [facility] (retail)       [address]       [place], [place]  [address]       Ph: [phone]       Fax: [phone]   RxID:   [id number] LEVOTHYROXINE SODIUM 100 MCG TABS (LEVOTHYROXINE SODIUM) Take 1 tablet by mouth once a day  #30 x 2   Entered and Authorized by:   Blanch Media MD   Signed by:   Blanch Media MD on 11/28/2009   Method used:   Electronically to        [facility] (retail)       [address]       [place], [place]  [address]       Ph: [phone]       Fax: [phone]   RxID:   [id number] CRESTOR 20 MG TABS (ROSUVASTATIN CALCIUM) take 1 tablet daily  #30 x 2   Entered and Authorized by:   Blanch Media MD   Signed by:   Blanch Media MD on 11/28/2009   Method used:   Electronically to        [facility] (retail)       [address]       [place], [place]  [address]       Ph: [phone]       Fax: [phone]   RxID:    [id number]

## 2010-02-13 NOTE — Assessment & Plan Note (Signed)
Summary: acute-per glayds antibiotic for abcess in mouth(riofrio)/cfb   Vital Signs:  Patient profile:   55 year old female Height:      60.75 inches (154.31 cm) Weight:      200.1 pounds (90.95 kg) BMI:     38.26 Temp:     97.3 degrees F (36.28 degrees C) oral Pulse rate:   78 / minute BP sitting:   159 / 85  (right arm)  Vitals Entered By: Stanton Kidney Ditzler RN (February 19, 2009 9:09 AM) Is Patient Diabetic? No Pain Assessment Patient in pain? yes     Location: both knees Intensity: 7 Type: aching Onset of pain  past year Nutritional Status BMI of > 30 = obese Nutritional Status Detail appetite good  Have you ever been in a relationship where you felt threatened, hurt or afraid?denies   Does patient need assistance? Functional Status Self care Ambulation Normal Comments Heartburn past 3-4 months. Discuss med for gout left big toe. Took antibiotics recently for dental - chest pains - stopped meds 02/18/09. Needs antibiotics for dental.   Primary Care Provider:  Olene Craven MD   History of Present Illness: Lydia Walsh is a 55 y/o woman with HTN, Gout and HLD who presents to the opc complaining of increased indigestion and burning sensation. Patient has been started on antibiotics for a tooth abscess and as soon she start taking the abx's her symptoms get even worse. She noticing more disconfort after big meals and also when lying down.   Patient has not been taking PPI as instructed anymore and is not following lifestyle modification instructions either. She reports to be compliant with the rest of her medications,but endorses not been compliant with her low sodium diet as indicated.  BP continue to be elevated, even is mildly better.  Patient will need another medication to help controlling her gout, since the insurance will no pay for colchicine anymore.  Pt denies CP, SOB, nausea, Vomiting, hematemesis, melena, Ha's, blurred vision, fever, chills or any other  complaints.  Depression History:      The patient denies a depressed mood most of the day and a diminished interest in her usual daily activities.        The patient denies that she feels like life is not worth living, denies that she wishes that she were dead, and denies that she has thought about ending her life.         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) 8)  Allergic Rhinitis, Seasonal  (ICD-477.0) 9)  G E R D  (ICD-530.81) 10)  Osteoarthritis  (ICD-715.90) 11)  Dyslipidemia  (ICD-272.4) 12)  Obstructive Sleep Apnea  (ICD-327.23) 13)  Hypothyroidism  (ICD-244.9) 14)  Hypertension  (ICD-401.9)  Current Problems (verified): 1)  Special Screening For Malignant Neoplasms Colon  (ICD-V76.51) 2)  Low Back Pain, Chronic  (ICD-724.2) 3)  Perimenopausal Syndrome  (ICD-627.9)  4)  Gout  (ICD-274.9) 5)  Urge Incontinence  (ICD-788.31) 6)  Loss, Sensorineural Hear, 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 Take 1 Tablet By Mouth Once A Day 2)  Atenolol 100 Mg  Tabs (Atenolol) .... Take 1 Tablet By Mouth Once A Day 3)  Levothyroxine Sodium 100  Mcg Tabs (Levothyroxine Sodium) .... Take 1 Tablet By Mouth Once A Day 4)  Amlodipine Besylate 10 Mg  Tabs (Amlodipine Besylate) .... Take 1 Tablet By Mouth Once A Day 5)  Colchicine 0.6 Mg Tabs (Colchicine) .... Take 1 [place]et By Mouth Every 4 H As Needed For Pain. 6)  Voltaren 1 % Gel (Diclofenac Sodium) .... Apply Three Times A Day Over Your Knees. 7)  Prevacid Solutab 30 Mg Tbdp (Lansoprazole) .... Take 1 Tablet By Mouth Two Times A Day  Current Medications (verified): 1)  Pravachol 40 Mg Tabs (Pravastatin Sodium) .... Take 1 Tablet By Mouth Once A Day 2)  Atenolol 100 Mg  Tabs (Atenolol) .... Take 1 Tablet By Mouth Once A Day 3)  Levothyroxine Sodium 100 Mcg Tabs (Levothyroxine Sodium) .... Take 1 Tablet By Mouth Once A Day 4)  Amlodipine Besylate 10 Mg  Tabs (Amlodipine Besylate) .... Take 1 Tablet By Mouth Once A Day 5)  Voltaren 1 % Gel (Diclofenac Sodium) .... Apply Three Times A Day Over Your Knees. 6)  Prevacid Solutab 30 Mg Tbdp (Lansoprazole) .... Take 1 Tablet By Mouth Two Times A Day  Allergies: 1)  ! Ace Inhibitors  Past History:  Past Medical History: Last updated: 11/24/2007 HYPERTENSION    - ECG:    - microalbuminuria: HYPOTHYROIDISM   - well controlled Anemia-iron deficiency, resolved Systolic murmur: 2D echo 02/2003: EF 55-65%, LA mildly dilated, mild MR Sleep Apnea, moderate, psg by Dr. 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 (per pt report)  Past Surgical History: Last updated: 02/17/2006 Uterine myomectomy  Social History: Last updated: 11/24/2007 Works at [employer] at [employer]. Lives with her son. Perimenopausal.  Risk Factors: Exercise: yes  (02/19/2009)  Risk Factors: Smoking Status: quit (02/19/2009) Passive Smoke Exposure: no (02/19/2009)  Review of Systems       As per HPI.  Physical Exam  General:  alert, well-developed, well-nourished, and well-hydrated.   Mouth:  poor dentition and teeth missing. Erythema in her gum around her teeth. Lungs:  normal respiratory effort, no intercostal retractions, no accessory muscle use, and normal breath sounds.   Heart:  normal rate and regular rhythm.   Abdomen:  soft, non-tender, normal bowel sounds, no distention, no masses, no guarding, and no rigidity.   Msk:  normal ROM, no joint swelling, no joint warmth, no joint deformities, no joint instability, and no crepitation.   Extremities:  1+ left pedal edema and 1+ right pedal edema.   Neurologic:  alert & oriented X3, cranial nerves  II-XII intact, strength normal in all extremities, and gait normal.     Impression & Recommendations:  Problem # 1:  ABSCESS, TOOTH (ICD-522.5) Patient already following with a dentist for this problem. She willcontinue the antibiotics as indicated by her dentist but was instructed to take it with food to avoid side effects and also to be compliant with GERD medications and lifestyle changes. Patient endorses understanding and will continue instructions as directed.  Problem # 2:  GOUT (ICD-274.9) Colchicine will no be pay by her insurance. Will start allopurinol and will check uric acid level. She received information regarding gout prevention and was advised to call the clinic if she experienced any flare while using allopurinol. Renal function was checked today, will follow on her results.   The following medications were removed from the medication list:    Colchicine 0.6 Mg Tabs (Colchicine) .Marland Kitchen... Take 1 talet by mouth every 4 h as needed for pain. Her updated medication list for this problem includes:    Allopurinol 100 Mg Tabs (Allopurinol) .Marland Kitchen... Take 1 tablet by mouth once a  day  Orders: T-Uric Acid (Blood) ([id number])  Problem # 3:  G E R D (ICD-530.81) Patient has not been compliant with her prevacid since last year, she reports responding well to that medication. Will restart prevacid 30mg  two times a day and will provide information regarding lifestyle modification changes.  Her updated medication list for this problem includes:    Prevacid Solutab 30 Mg Tbdp (Lansoprazole) .Marland Kitchen... Take 1 tablet by mouth two times a day  Problem # 4:  HYPOTHYROIDISM (ICD-244.9) Will check TSH levelsand will continue synthroid. Dose to be adjusted if needed upon results. No symptoms of hypothyroidism reported.  Her updated medication list for this problem includes:    Levothyroxine Sodium 100 Mcg Tabs (Levothyroxine sodium) .Marland Kitchen... Take 1 tablet by mouth once a day  Orders: T-TSH ([id number])  Problem # 5:  HYPERTENSION (ICD-401.9) Patient BP still not a goal. Will start lasix for better controlled and will encouraged patient to follow a low sodium diet. Will check renal function and electrolytes today.  Her updated medication list for this problem includes:    Atenolol 100 Mg Tabs (Atenolol) .Marland Kitchen... Take 1 tablet by mouth once a day    Amlodipine Besylate 10 Mg Tabs (Amlodipine besylate) .Marland Kitchen... Take 1 tablet by mouth once a day    Furosemide 40 Mg Tabs (Furosemide) .Marland Kitchen... Take 1 tablet by mouth once a day  Complete Medication List: 1)  Pravachol 40 Mg Tabs (Pravastatin sodium) .... Take 1 tablet by mouth once a day 2)  Atenolol 100 Mg Tabs (Atenolol) .... Take 1 tablet by mouth once a day 3)  Levothyroxine Sodium 100 Mcg Tabs (Levothyroxine sodium) .... Take 1 tablet by mouth once a day 4)  Amlodipine Besylate 10 Mg Tabs (Amlodipine besylate) .... Take 1 tablet by mouth once a day 5)  Voltaren 1 % Gel (Diclofenac sodium) .... Apply three times a day over your knees. 6)  Prevacid Solutab 30 Mg Tbdp (Lansoprazole) .... Take 1 tablet by mouth two times a day 7)  Furosemide  40 Mg Tabs (Furosemide) .... Take 1 tablet by mouth once a day 8)  Allopurinol 100 Mg Tabs (Allopurinol) .... Take 1 tablet by mouth once a day 9)  Amoxicillin 500 Mg Caps (Amoxicillin) .Marland Kitchen.. 1 tab by mouth every 8 hours.  Other Orders: Gastroenterology Referral (GI) T-Lipid Profile [phone]) T-Comprehensive Metabolic Panel [id number])  Patient Instructions: 1)  Please schedule a  follow-up appointment in 1 month for your BP. 2)  Take your medications as prescribed. 3)  Follow a low sodium diet. 4)  Take your antibiotics with food. 5)  Avoid foods high in acid (tomatoes, citrus juices, spicy foods). Avoid eating within two hours of lying down or before exercising. Do not over eat; try smaller more frequent meals. Elevate head of bed twelve inches when sleeping. 6)  To prevent gout attacks,avoid purine rich foods, such as beer, beans & peas, and meat gravies. 7)  You will be called with any abnormalities in the tests scheduled or performed today.  If you don't hear from [place] within a week from when the test was performed, you can assume that your test was normal. Prescriptions: PREVACID SOLUTAB 30 MG TBDP (LANSOPRAZOLE) Take 1 tablet by mouth two times a day  #62 x 6   Entered and Authorized by:   Vassie Loll MD   Signed by:   Vassie Loll MD on 02/19/2009   Method used:   [facility]        [id number]       [address]       [place], [place]  [address]       Ph: [phone] or [phone]       Fax: [phone]   RxID:   [id number] ALLOPURINOL 100 MG TABS (ALLOPURINOL) Take 1 tablet by mouth once a day  #31 x 3   Entered and Authorized by:   Vassie Loll MD   Signed by:   Vassie Loll MD on 02/19/2009   Method used:   [facility]        [id number]       [address]       [place], [place]  [address]       Ph: [phone] or [phone]       Fax: [phone]   RxID:    [id number] FUROSEMIDE 40 MG TABS (FUROSEMIDE) Take 1 tablet by mouth once a day  #31 x 3   Entered and Authorized by:   Vassie Loll MD   Signed by:   Vassie Loll MD on 02/19/2009   Method used:   [facility]        [id number]       [address]       [place], [place]  [address]       Ph: [phone] or [phone]       Fax: [phone]   RxID:   [id number]    Prevention & Chronic Care Immunizations   Influenza vaccine: Not documented   Influenza vaccine deferral: Deferred  (02/19/2009)    Tetanus booster: Not documented   Td booster deferral: Deferred  (02/19/2009)    Pneumococcal vaccine: Not documented   Pneumococcal vaccine deferral: Deferred  (02/19/2009)  Colorectal Screening   Hemoccult: Not documented   Hemoccult action/deferral: Deferred  (02/19/2009)    Colonoscopy: Not documented   Colonoscopy action/deferral: GI referral  (02/19/2009)  Other Screening   Pap smear: Not documented   Pap smear action/deferral: Deferred  (02/19/2009)    Mammogram: Not documented   Mammogram action/deferral: Deferred  (02/19/2009)   Smoking status: quit  (02/19/2009)  Lipids   Total Cholesterol: 244  (02/08/2008)   Lipid panel action/deferral: Lipid Panel ordered  LDL: 146  (02/08/2008)   LDL Direct: Not documented   HDL: 72  (02/08/2008)   Triglycerides: 128  (02/08/2008)    SGOT (AST): 30  (02/08/2008)   BMP action: Ordered   SGPT (ALT): 35  (02/08/2008) CMP ordered    Alkaline phosphatase: 99  (02/08/2008)   Total bilirubin: 0.6  (02/08/2008)    Lipid flowsheet reviewed?: Yes   Progress toward LDL goal: Improved  Hypertension   Last Blood Pressure: 159 / 85  (02/19/2009)   Serum creatinine: 0.64  (02/08/2008)   BMP action: Ordered   Serum potassium 4.0  (02/08/2008) CMP ordered     Hypertension flowsheet reviewed?: Yes   Progress toward BP goal: Improved  Self-Management Support :    Personal Goals (by the next clinic visit) :      Personal blood pressure goal: 140/90  (02/19/2009)     Personal LDL goal: 130  (02/19/2009)    Patient will work on the following items until the next clinic visit to reach self-care goals:     Medications and monitoring: take my medicines every day, bring all of my medications to every visit  (02/19/2009)     Eating: drink diet soda or water instead of juice or soda, eat more vegetables, use fresh or frozen vegetables, eat foods that are low in salt, eat baked foods instead of fried foods, eat fruit for snacks and desserts, limit or avoid alcohol  (02/19/2009)     Activity: take a 30 minute walk every day, take the stairs instead of the elevator, park at the far end of the parking lot  (02/19/2009)    Hypertension self-management support: Written self-care plan  (02/19/2009)   Hypertension self-care plan printed.    Lipid self-management support: Written self-care plan  (02/19/2009)   Lipid self-care plan printed.   Nursing Instructions: GI referral for screening colonoscopy (see order)   Process Orders Check Orders Results:     [facility]: ABN not required for this insurance Tests Sent for requisitioning (February 19, 2009 12:06 PM):     02/19/2009: [facility] -- T-Lipid Profile [phone] (signed)     02/19/2009: [facility] -- T-Comprehensive Metabolic Panel [[id number]] (signed)     02/19/2009: [facility] -- T-TSH [phone] (signed)     02/19/2009: [facility] -- T-Uric Acid (Blood) [[id number]] (signed)

## 2010-02-13 NOTE — Assessment & Plan Note (Signed)
Summary: RA/RECK BP/VS   Vital Signs:  Patient profile:   55 year old female Height:      60.75 inches (154.31 cm) Weight:      195.1 pounds (88.68 kg) BMI:     37.30 Temp:     98.5 degrees F (36.94 degrees C) oral Pulse rate:   66 / minute BP sitting:   141 / 75  (right arm)  Vitals Entered By: Chinita Pester RN (March 25, 2009 9:19 AM) CC: BP re-check. Is Patient Diabetic? No Pain Assessment Patient in pain? yes     Location: bilateral knees Intensity: 7 Type: aching Onset of pain  Intermittent Nutritional Status BMI of > 30 = obese  Have you ever been in a relationship where you felt threatened, hurt or afraid?No   Does patient need assistance? Functional Status Self care Ambulation Normal   Primary Care Provider:  Olene Craven MD  CC:  BP re-check..  History of Present Illness: Lydia Walsh is a 55 yo f with HTN and HLD who presents for 1 month follow-up of her blood pressure. Her BP was 159/85 last month and was started on Lasix 40 mg daily. Her BP is now 141/75. She has done well on this medication, reporting that "it makes me pee" but otherwise has no complaints other than occasional minor cramps. She would like a refill on the voltaren gel for her knee pain.   Depression History:      The patient denies a depressed mood most of the day and a diminished interest in her usual daily activities.         Preventive Screening-Counseling & Management  Alcohol-Tobacco     Alcohol drinks/day: 0     Smoking Status: quit     Year Quit: quit 42yrs ago     Passive Smoke Exposure: no  Caffeine-Diet-Exercise     Does Patient Exercise: yes     Type of exercise: WALKING     Times/week:   6-7  Current Medications (verified): 1)  Crestor 20 Mg Tabs (Rosuvastatin Calcium) .... Take 1 Tablet Daily 2)  Atenolol 100 Mg  Tabs (Atenolol) .... Take 1 Tablet By Mouth Once A Day 3)  Levothyroxine Sodium 100 Mcg Tabs (Levothyroxine Sodium) .... Take 1 Tablet By Mouth Once A  Day 4)  Amlodipine Besylate 10 Mg  Tabs (Amlodipine Besylate) .... Take 1 Tablet By Mouth Once A Day 5)  Voltaren 1 % Gel (Diclofenac Sodium) .... Apply Three Times A Day Over Your Knees. 6)  Prevacid Solutab 30 Mg Tbdp (Lansoprazole) .... Take 1 Tablet By Mouth Two Times A Day 7)  Furosemide 40 Mg Tabs (Furosemide) .... Take 1 Tablet By Mouth Once A Day 8)  Allopurinol 100 Mg Tabs (Allopurinol) .... Take 1 Tablet By Mouth Once A Day 9)  Potassium Chloride 20 Meq Pack (Potassium Chloride) .... Take 1 Pack Daily  Allergies (verified): 1)  ! Ace Inhibitors  Physical Exam  General:  Well-developed,well-nourished,in no acute distress; alert,appropriate and cooperative throughout examination Head:  Normocephalic and atraumatic without obvious abnormalities. No apparent alopecia or balding. Neurologic:  alert & oriented X3.   Psych:  Cognition and judgment appear intact. Alert and cooperative with normal attention span and concentration. No apparent delusions, illusions, hallucinations   Impression & Recommendations:  Problem # 1:  HYPERTENSION (ICD-401.9)  Her BP is essentially at goal given that she does not have diabetes. She has previously taken Maxzide but had a gout flare on it, so HCTZ is  not a good option for her. Will check Bmet and continue with Lasix and start KCl (pt did report mild cramps).   Her updated medication list for this problem includes:    Atenolol 100 Mg Tabs (Atenolol) .Marland Kitchen... Take 1 tablet by mouth once a day    Amlodipine Besylate 10 Mg Tabs (Amlodipine besylate) .Marland Kitchen... Take 1 tablet by mouth once a day    Furosemide 40 Mg Tabs (Furosemide) .Marland Kitchen... Take 1 tablet by mouth once a day  Orders: T-Basic Metabolic Panel [id number])  Problem # 2:  DYSLIPIDEMIA (ICD-272.4) LDL improving but still elevated at 151. Given that she has insurance, will switch to Crestor 20 mg daily as it is more powerful. Will have patient return in 1 month for LFTs.  Her updated medication  list for this problem includes:    Crestor 20 Mg Tabs (Rosuvastatin calcium) .Marland Kitchen... Take 1 tablet daily  Problem # 3:  ABSCESS, TOOTH (ICD-522.5) Had teeth removed last week. Pt said she is adjusting to the dentures and is doing much better.  Problem # 4:  OSTEOARTHRITIS (ICD-715.90) She would like a refill for voltaren gel, as this helped considerably. Will refill.  Complete Medication List: 1)  Crestor 20 Mg Tabs (Rosuvastatin calcium) .... Take 1 tablet daily 2)  Atenolol 100 Mg Tabs (Atenolol) .... Take 1 tablet by mouth once a day 3)  Levothyroxine Sodium 100 Mcg Tabs (Levothyroxine sodium) .... Take 1 tablet by mouth once a day 4)  Amlodipine Besylate 10 Mg Tabs (Amlodipine besylate) .... Take 1 tablet by mouth once a day 5)  Voltaren 1 % Gel (Diclofenac sodium) .... Apply three times a day over your knees. 6)  Prevacid Solutab 30 Mg Tbdp (Lansoprazole) .... Take 1 tablet by mouth two times a day 7)  Furosemide 40 Mg Tabs (Furosemide) .... Take 1 tablet by mouth once a day 8)  Allopurinol 100 Mg Tabs (Allopurinol) .... Take 1 tablet by mouth once a day 9)  Potassium Chloride 20 Meq Pack (Potassium chloride) .... Take 1 pack daily  Patient Instructions: 1)  Please schedule a follow-up appointment with Dr. Tobie Lords in 1 month. 2)  I have changed your cholesterol medication to Crestor. Please take this instead of the Pravachol (pravastatin). 3)  I have also written you for potassium supplements. Please take one daily. Prescriptions: POTASSIUM CHLORIDE 20 MEQ PACK (POTASSIUM CHLORIDE) take 1 pack daily  #30 x 6   Entered and Authorized by:   Silvestre Gunner MD   Signed by:   Silvestre Gunner MD on 03/25/2009   Method used:   Electronically to        [facility] (retail)       [address]       [place], [place]  [address]       Ph: [phone]       Fax: [phone]   RxID:   [id number] FUROSEMIDE 40 MG TABS (FUROSEMIDE) Take 1 tablet by mouth  once a day  #31 x 6   Entered and Authorized by:   Silvestre Gunner MD   Signed by:   Silvestre Gunner MD on 03/25/2009   Method used:   Electronically to        [facility] (retail)       [address]       [place], [place]  [address]  Ph: [phone]       Fax: [phone]   RxID:   [id number] VOLTAREN 1 % GEL (DICLOFENAC SODIUM) Apply three times a day over your knees.  #1 x 6   Entered and Authorized by:   Silvestre Gunner MD   Signed by:   Silvestre Gunner MD on 03/25/2009   Method used:   Electronically to        [facility] (retail)       [address]       [place], [place]  [address]       Ph: [phone]       Fax: [phone]   RxID:   [id number] CRESTOR 20 MG TABS (ROSUVASTATIN CALCIUM) take 1 tablet daily  #30 x 6   Entered and Authorized by:   Silvestre Gunner MD   Signed by:   Silvestre Gunner MD on 03/25/2009   Method used:   Electronically to        [facility] (retail)       [address]       [place], [place]  [address]       Ph: [phone]       Fax: [phone]   RxID:   [id number]  Process Orders Check Orders Results:     [facility]: ABN not required for this insurance Tests Sent for requisitioning (March 25, 2009 10:21 AM):     03/25/2009: [facility] -- T-Basic Metabolic Panel [id number] (signed)    Prevention & Chronic Care Immunizations   Influenza vaccine: Not documented   Influenza vaccine deferral: Deferred  (02/19/2009)    Tetanus booster: Not documented   Td booster deferral: Deferred  (02/19/2009)    Pneumococcal vaccine: Not documented   Pneumococcal vaccine deferral: Deferred  (02/19/2009)  Colorectal Screening   Hemoccult: Not documented   Hemoccult action/deferral: Deferred  (02/19/2009)    Colonoscopy: Not documented   Colonoscopy action/deferral: GI referral  (02/19/2009)  Other  Screening   Pap smear: Not documented   Pap smear action/deferral: Deferred  (02/19/2009)    Mammogram: Not documented   Mammogram action/deferral: Deferred  (02/19/2009)   Smoking status: quit  (03/25/2009)  Lipids   Total Cholesterol: 232  (02/19/2009)   Lipid panel action/deferral: Lipid Panel ordered   LDL: 151  (02/19/2009)   LDL Direct: Not documented   HDL: 66  (02/19/2009)   Triglycerides: 77  (02/19/2009)    SGOT (AST): 19  (02/19/2009)   BMP action: Ordered   SGPT (ALT): 22  (02/19/2009)   Alkaline phosphatase: 106  (02/19/2009)   Total bilirubin: 0.6  (02/19/2009)  Hypertension   Last Blood Pressure: 141 / 75  (03/25/2009)   Serum creatinine: 0.65  (02/19/2009)   BMP action: Ordered   Serum potassium 4.1  (02/19/2009)  Self-Management Support :   Personal Goals (by the next clinic visit) :      Personal blood pressure goal: 140/90  (02/19/2009)     Personal LDL goal: 100  (03/25/2009)    Patient will work on the following items until the next clinic visit to reach self-care goals:     Medications and monitoring: take my medicines every day, bring all of my medications to every visit  (03/25/2009)     Eating: eat more vegetables, use fresh or frozen vegetables, eat fruit for snacks and desserts  (03/25/2009)  Activity: take a 30 minute walk every day, take the stairs instead of the elevator, park at the far end of the parking lot  (02/19/2009)    Hypertension self-management support: Education handout, Resources for patients handout, Written self-care plan  (03/25/2009)   Hypertension self-care plan printed.   Hypertension education handout printed    Lipid self-management support: Education handout, Resources for patients handout, Written self-care plan  (03/25/2009)   Lipid self-care plan printed.   Lipid education handout printed      Resource handout printed.

## 2010-02-13 NOTE — Progress Notes (Signed)
Summary: medication/gp  Phone Note Call from Patient   Summary of Call: Voltaren gel has been denied by her insurance company.  Dr. Tobie Lords wants pt. to take Aleve  and to call if pain not relieved.  I talked to the pt. and informed her to try Aleve. And she said she would and to let us know if it does not help. Initial call taken by: Chinita Pester RN,  April 11, 2009 3:01 PM

## 2010-02-13 NOTE — Assessment & Plan Note (Signed)
Summary: F/U/RIOFRIO/VS   Vital Signs:  Patient profile:   55 year old female Height:      60.75 inches Weight:      194.5 pounds BMI:     37.19 Temp:     97.5 degrees F oral Pulse rate:   68 / minute Pulse (ortho):   70 / minute BP sitting:   140 / 73  (right arm)  Vitals Entered By: Filomena Jungling NT II (April 25, 2009 9:50 AM)  Serial Vital Signs/Assessments:  Time      Position  BP       Pulse  Resp  Temp     By 10:05 AM  Lying RA  110/60   70                    Mamie Hague NT II 10:05 AM  Sitting   120/70   70                    Mamie Hague NT II 10:05 AM  Standing  120/70   70                    Mamie Hague NT II  CC: DIZZINESS  X 1 WEEK, CRAMPS IN LEGS, ? ABOUT RHUEMOTOLOGIST Is Patient Diabetic? No Pain Assessment Patient in pain? yes     Location: knees Intensity: 8 Type: aching Onset of pain  Chronic Nutritional Status BMI of > 30 = obese  Does patient need assistance? Functional Status Self care Ambulation Normal   Primary Care Provider:  Olene Craven MD  CC:  DIZZINESS  X 1 WEEK, CRAMPS IN LEGS, and ? ABOUT RHUEMOTOLOGIST.  History of Present Illness: sports medicine antiinflam crestor meclizine cmet flp  Ms. Sheaffer is a 55 yo f with PMH outlined in the EMR who present for 1 month follow-up of her blood pressure.   1.HTN: Pt is doing well on current regimen and taking all medicines regularly.  2: Knee pain: pt complains of persisting knee pain for weeks-months. She denies any redness, swelling, trauma, difficulty walking and problem with range of motion.  SHe has not tried any OTC meds to help with pain.  She thinks the pain may be arthritis, however she is concerned because it continues to hurt without periods of significant relief.  Admits to joint stiffness after periods of inactivity.  3. Dizziness: pt reports episodes of dizziness that occur intermittently throughout the day. Denies any syncopal event and heart palpitations.   Sometimes, her symptoms occur when standing from a sitting position, other times she becomes dizzy while sitting.  Admits to occasional episode while having a bowel movement.  Does not feel symptoms are precipitated by changes in positioning/head rotation.  Admits to intermittent ringing in her ears present for "awhile."  Denies nausea,vomiting, visual changes, and H/A.    4. HLD: Pt is taking Crestor regularly and tolerating the medicine well.    Preventive Screening-Counseling & Management  Alcohol-Tobacco     Alcohol drinks/day: 0     Smoking Status: quit     Year Quit: quit 39yrs ago     Passive Smoke Exposure: no  Caffeine-Diet-Exercise     Does Patient Exercise: yes     Type of exercise: WALKING     Times/week:   6-7  Allergies: 1)  ! Ace Inhibitors   Impression & Recommendations:  Problem # 1:  HYPERTENSION (ICD-401.9) Stable.  WIll continue with current regimen  anc check CMET today.  Her updated medication list for this problem includes:    Atenolol 100 Mg Tabs (Atenolol) .Marland Kitchen... Take 1 tablet by mouth once a day    Amlodipine Besylate 10 Mg Tabs (Amlodipine besylate) .Marland Kitchen... Take 1 tablet by mouth once a day    Furosemide 40 Mg Tabs (Furosemide) .Marland Kitchen... Take 1 tablet by mouth once a day  Orders: T-Comprehensive Metabolic Panel ([id number])  BP today: 140/73 Prior BP: 141/75 (03/25/2009)  Labs Reviewed: K+: 3.9 (03/25/2009) Creat: : 0.76 (03/25/2009)   Chol: 232 (02/19/2009)   HDL: 66 (02/19/2009)   LDL: 151 (02/19/2009)   TG: 77 (02/19/2009)  Problem # 2:  DIZZINESS (ICD-780.4) Orthostatics checked and negative.  Her symptoms are not entirely consistent with orthostasis, nor vertigo; however she does report tinnitus.  I feel her symptoms are most consistent with mild vertigo or are possible vaso-vagal in nature.  Will try a course of meclizine and f/u at next visit.  Her updated medication list for this problem includes:    Meclizine Hcl 25 Mg Tabs (Meclizine hcl)  .Marland Kitchen... Take one tab up to 4 times per day as needed for dizziness  Problem # 3:  DYSLIPIDEMIA (ICD-272.4) Pt tolerating statin well.  WIll check LFTs and FLP today.  Her updated medication list for this problem includes:    Crestor 20 Mg Tabs (Rosuvastatin calcium) .Marland Kitchen... Take 1 tablet daily  Orders: T-Lipid Profile ([id number])  Problem # 4:  KNEE PAIN (ICD-719.46) Pt symptoms and hx are most consitent with OA.  Will start her on NSAIDs for pain relief.  Will refer her to sports medicine for further evaluation per plan reached after discussion with patient.  Her updated medication list for this problem includes:    Meloxicam 7.5 Mg Tabs (Meloxicam) .Marland Kitchen... Take one tab by mouth once daily with food  Complete Medication List: 1)  Crestor 20 Mg Tabs (Rosuvastatin calcium) .... Take 1 tablet daily 2)  Atenolol 100 Mg Tabs (Atenolol) .... Take 1 tablet by mouth once a day 3)  Levothyroxine Sodium 100 Mcg Tabs (Levothyroxine sodium) .... Take 1 tablet by mouth once a day 4)  Amlodipine Besylate 10 Mg Tabs (Amlodipine besylate) .... Take 1 tablet by mouth once a day 5)  Voltaren 1 % Gel (Diclofenac sodium) .... Apply three times a day over your knees. 6)  Prevacid Solutab 30 Mg Tbdp (Lansoprazole) .... Take 1 tablet by mouth two times a day 7)  Furosemide 40 Mg Tabs (Furosemide) .... Take 1 tablet by mouth once a day 8)  Allopurinol 100 Mg Tabs (Allopurinol) .... Take 1 tablet by mouth once a day 9)  Potassium Chloride 20 Meq Pack (Potassium chloride) .... Take 1 pack daily 10)  Meclizine Hcl 25 Mg Tabs (Meclizine hcl) .... Take one tab up to 4 times per day as needed for dizziness 11)  Meloxicam 7.5 Mg Tabs (Meloxicam) .... Take one tab by mouth once daily with food  Patient Instructions: 1)  Please schedule a follow-up appointment in 2 months or sooner if needed. 2)  Well will refer you to a sports medicine doctor to evaluate your knee. 3)  YOu have been given two new prescriptions 4)   Meloxicam is an anti-inflammatory drug to help your knee.  Take it with food as directed. 5)  Meclizine is to help with dizziness. 6)  We will call you if you lab work is abnormal. Prescriptions: MELOXICAM 7.5 MG TABS (MELOXICAM) Take one tab by mouth once daily with  food  #31 x 3   Entered and Authorized by:   Nelda Bucks DO   Signed by:   Nelda Bucks DO on 04/25/2009   Method used:   Electronically to        [facility] (retail)       [address]       [place], [place]  [address]       Ph: [phone]       Fax: [phone]   RxID:   [id number] MECLIZINE HCL 25 MG TABS (MECLIZINE HCL) Take one tab up to 4 times per day as needed for dizziness  #60 x 1   Entered and Authorized by:   Nelda Bucks DO   Signed by:   Nelda Bucks DO on 04/25/2009   Method used:   Electronically to        [facility] (retail)       [address]       [place], [place]  [address]       Ph: [phone]       Fax: [phone]   RxID:   [id number]  Process Orders Check Orders Results:     [facility]: ABN not required for this insurance Tests Sent for requisitioning (May 01, 2009 10:30 AM):     04/25/2009: [facility] -- T-Comprehensive Metabolic Panel [[id number]] (signed)     04/25/2009: [facility] -- T-Lipid Profile [phone] (signed)

## 2010-02-13 NOTE — Progress Notes (Signed)
Summary: REfill/gh  Phone Note Refill Request Message from:  Patient on February 11, 2009 4:42 PM  Refills Requested: Medication #1:  COLCHICINE 0.6 MG TABS Take 1 talet by mouth every 4 h as needed for pain. Wants something as Colchicine is not available anymore.  Last visit and labs 02/07/2009.   Method Requested: Electronic Initial call taken by: Angelina Ok RN,  February 11, 2009 4:43 PM  Follow-up for Phone Call        Diagnosis of gout not well established.  Ms. Cragg has not been seen in clinic in over 1 year.  Please schedule her for the next available non-overbook clinic appointment with Dr. Tobie Lords to review.  Fortunately it does not look like she has needed it much as she was only given 2 months at the last visit and this has lasted greater than a year.  In the interim she can take as needed NSAIDs over the counter rather than the colchicine which can have significant side effects.  Please call her with this advice and follow-up appointment time.  Thank You. Follow-up by: Doneen Poisson MD,  February 11, 2009 5:41 PM  Additional Follow-up for Phone Call Additional follow up Details #1::        Spoke with pt said that she is awarethat she has not been in for an appointment.  Pt now needs an antibiotic for upcoming Dental Surgery.  Pt was given an appointment for 02/19/2009 to discuss theantibiotic and was informed thta she will need to have an appointment t discuss her need for the Gout medication.  Suggestion to use over the counter Nsaids if needed.  Ex. Naprosyn OTC.   Additional Follow-up by: Angelina Ok RN,  February 18, 2009 11:08 AM

## 2010-02-13 NOTE — Progress Notes (Signed)
Summary: Prior Authorization Process for Voltaren  Phone Note Outgoing Call   Call placed by: Angelina Ok RN,  March 26, 2009 11:41 AM Call placed to: Insurer Summary of Call: Call to [employer] for Prior Authorization on Voltaren 1% Gel.  Form to be faxed and completed for authorization. Angelina Ok RN  March 26, 2009 11:42 AM  Initial call taken by: Angelina Ok RN,  March 26, 2009 11:42 AM  Follow-up for Phone Call        PA form completed by Dr.Riofrio and faxed. Follow-up by: Chinita Pester RN,  April 08, 2009 4:46 PM     Appended Document: Prior Authorization Process for Voltaren [place] for Voltren Gel has been denied b/c  pt.has to try  at least 3 oral NASAIDs  and when the pt. is not using this med. in combination w/oral NSAIDs or a COX II inhibitor. MD  made awared.

## 2010-02-13 NOTE — Assessment & Plan Note (Signed)
Summary: EST-2 MONTH CHECKUP/CH   Vital Signs:  Patient profile:   55 year old female Height:      59.75 inches (151.77 cm) Weight:      189 pounds (85.91 kg) BMI:     37.36 Temp:     98.6 degrees F Pulse rate:   63 / minute BP sitting:   128 / 78  (right arm) Cuff size:   regular  Vitals Entered By: Dorie Rank RN (June 28, 2009 9:50 AM) CC: check up Is Patient Diabetic? No Pain Assessment Patient in pain? yes     Location: both legs Intensity: 8 Type: aching Onset of pain  Chronic Nutritional Status BMI of > 30 = obese  Have you ever been in a relationship where you felt threatened, hurt or afraid?No   Does patient need assistance? Functional Status Self care Ambulation Normal   Primary Care Provider:  Olene Craven MD  CC:  check up.  History of Present Illness: Lydia Walsh is a 55 yo F with PMH of HTN and knee pain who presents for 2 mo follow-up. Her BP has been well-controlled and the dizziness she c/o at her last visit has resolved with meclezine; this was likely vertigo. She still c/o knee pain bilaterally, L>R, though she was unable to keep her sports medicine appt and did not take a course of NSAIDs as suggested by Dr. Arvilla Market in 4/11. She has no other concerns.  Preventive Screening-Counseling & Management  Alcohol-Tobacco     Alcohol drinks/day: 0     Smoking Status: quit     Year Quit: quit 9yrs ago     Passive Smoke Exposure: no  Caffeine-Diet-Exercise     Does Patient Exercise: yes     Type of exercise: WALKING     Times/week:   6-7  Current Medications (verified): 1)  Crestor 20 Mg Tabs (Rosuvastatin Calcium) .... Take 1 Tablet Daily 2)  Atenolol 100 Mg  Tabs (Atenolol) .... Take 1 Tablet By Mouth Once A Day 3)  Levothyroxine Sodium 100 Mcg Tabs (Levothyroxine Sodium) .... Take 1 Tablet By Mouth Once A Day 4)  Amlodipine Besylate 10 Mg  Tabs (Amlodipine Besylate) .... Take 1 Tablet By Mouth Once A Day 5)  Voltaren 1 % Gel (Diclofenac Sodium)  .... Apply Three Times A Day Over Your Knees. 6)  Prevacid Solutab 30 Mg Tbdp (Lansoprazole) .... Take 1 Tablet By Mouth Two Times A Day 7)  Furosemide 40 Mg Tabs (Furosemide) .... Take 1 Tablet By Mouth Once A Day 8)  Allopurinol 100 Mg Tabs (Allopurinol) .... Take 1 Tablet By Mouth Once A Day 9)  Potassium Chloride 20 Meq Pack (Potassium Chloride) .... Take 1 Pack Daily 10)  Meclizine Hcl 25 Mg Tabs (Meclizine Hcl) .... Take One Tab Up To 4 Times Per Day As Needed For Dizziness 11)  Meloxicam 7.5 Mg Tabs (Meloxicam) .... Take One Tab By Mouth Once Daily With Food  Allergies (verified): 1)  ! Ace Inhibitors  Past History:  Past Medical History: Last updated: 11/24/2007 HYPERTENSION    - ECG:    - microalbuminuria: HYPOTHYROIDISM   - well controlled Anemia-iron deficiency, resolved Systolic murmur: 2D echo 02/2003: EF 55-65%, LA mildly dilated, mild MR Sleep Apnea, moderate, psg by Dr. 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 (per pt report)  Past Surgical History: Last updated: 02/17/2006 Uterine myomectomy  Social History: Last updated: 11/24/2007 Works at [employer] at [employer]. Lives with her son. Perimenopausal.  Risk Factors: Alcohol Use: 0 (06/28/2009) Exercise: yes (06/28/2009)  Risk Factors: Smoking Status: quit (06/28/2009) Passive Smoke Exposure: no (06/28/2009)  Physical Exam  General:  Well-developed,well-nourished,in no acute distress; alert,appropriate and cooperative throughout examination Head:  Normocephalic and atraumatic without obvious abnormalities. No apparent alopecia or balding. Msk:  swelling of both knees and below knees (L>R), mild  tenderness of the knee joint, no pain on palpation of posterior aspect of joint   Impression & Recommendations:  Problem # 1:  KNEE PAIN (ICD-719.46) Her pain continues and her knees and the anterior aspect below the knees are still swollen (but both are stable per patient). She has not triend the course of NSAIDs as suggested by Dr. Arvilla Market in 4/11. I told her to try a short 5-7 course of scheduled NSAIDs (along with her lansoprazole) and will re-refer to sports medicine clinic as she was unable to make her last 2 appts with them. Etiology is likely OA.  The following medications were removed from the medication list:    Meloxicam 7.5 Mg Tabs (Meloxicam) .Marland Kitchen... Take one tab by mouth once daily with food Her updated medication list for this problem includes:    Ibuprofen 600 Mg Tabs (Ibuprofen) .Marland Kitchen... Take 600 mg every 8 hours for the next 5-7 days  Orders: Sports Medicine (Sports Med)  Problem # 2:  DIZZINESS (ICD-780.4) Resolved. Continue meclicine as needed.  Her updated medication list for this problem includes:    Meclizine Hcl 25 Mg Tabs (Meclizine hcl) .Marland Kitchen... Take one tab up to 4 times per day as needed for dizziness  Problem # 3:  HYPERTENSION (ICD-401.9) Well-controlled. Will continue current med regimen.  Her updated medication list for this problem includes:    Atenolol 100 Mg Tabs (Atenolol) .Marland Kitchen... Take 1 tablet by mouth once a day    Amlodipine Besylate 10 Mg Tabs (Amlodipine besylate) .Marland Kitchen... Take 1 tablet by mouth once a day    Furosemide 40 Mg Tabs (Furosemide) .Marland Kitchen... Take 1 tablet by mouth once a day  Complete Medication List: 1)  Crestor 20 Mg Tabs (Rosuvastatin calcium) .... Take 1 tablet daily 2)  Atenolol 100 Mg Tabs (Atenolol) .... Take 1 tablet by mouth once a day 3)  Levothyroxine Sodium 100 Mcg Tabs (Levothyroxine sodium) .... Take 1 tablet by mouth once a day 4)  Amlodipine Besylate 10 Mg Tabs (Amlodipine besylate) .... Take 1 tablet by mouth once a day 5)   Voltaren 1 % Gel (Diclofenac sodium) .... Apply three times a day over your knees. 6)  Prevacid Solutab 30 Mg Tbdp (Lansoprazole) .... Take 1 tablet by mouth two times a day 7)  Furosemide 40 Mg Tabs (Furosemide) .... Take 1 tablet by mouth once a day 8)  Allopurinol 100 Mg Tabs (Allopurinol) .... Take 1 tablet by mouth once a day 9)  Potassium Chloride 20 Meq Pack (Potassium chloride) .... Take 1 pack daily 10)  Meclizine Hcl 25 Mg Tabs (Meclizine hcl) .... Take one tab up to 4 times per day as needed for dizziness 11)  Ibuprofen 600 Mg Tabs (Ibuprofen) .... Take 600 mg every 8 hours for the next 5-7 days  Patient Instructions: 1)  You have an appointment with Dr. Jennette Kettle at the Sports Medicine clinic on July 1 @ 10:45 am. Please arrive by 10:30. 2)  Please schedule an appointment with the Outpatient Clinic in 3 months or sooner if needed. 3)  For the next 5-7 days, take Ibuprofen 600 mg every 8 hours. Continue to take your lansoprazole along with it. If you feel sick to your stomach, you can stop taking the Ibuprofen.   Prevention & Chronic Care Immunizations   Influenza vaccine: Not documented   Influenza vaccine deferral: Deferred  (02/19/2009)    Tetanus booster: Not documented   Td booster deferral: Deferred  (02/19/2009)    Pneumococcal vaccine: Not documented   Pneumococcal vaccine deferral: Deferred  (02/19/2009)  Colorectal Screening   Hemoccult: Not documented   Hemoccult action/deferral: Deferred  (02/19/2009)    Colonoscopy: Not documented   Colonoscopy action/deferral: GI referral  (02/19/2009)  Other Screening   Pap smear: Not documented   Pap smear action/deferral: Deferred  (02/19/2009)    Mammogram: Not documented   Mammogram action/deferral: Deferred  (02/19/2009)   Smoking status: quit  (06/28/2009)  Lipids   Total Cholesterol: 194  (04/26/2009)   Lipid panel action/deferral: Lipid Panel ordered   LDL: 112  (04/26/2009)   LDL Direct: Not documented    HDL: 64  (04/26/2009)   Triglycerides: 90  (04/26/2009)    SGOT (AST): 28  (04/26/2009)   BMP action: Ordered   SGPT (ALT): 30  (04/26/2009)   Alkaline phosphatase: 110  (04/26/2009)   Total bilirubin: 0.6  (04/26/2009)  Hypertension   Last Blood Pressure: 128 / 78  (06/28/2009)   Serum creatinine: 0.81  (04/26/2009)   BMP action: Ordered   Serum potassium 3.5  (04/26/2009)  Self-Management Support :   Personal Goals (by the next clinic visit) :      Personal blood pressure goal: 140/90  (02/19/2009)     Personal LDL goal: 100  (03/25/2009)    Patient will work on the following items until the next clinic visit to reach self-care goals:     Medications and monitoring: take my medicines every day, bring all of my medications to every visit  (06/28/2009)     Eating: drink diet soda or water instead of juice or soda, eat more vegetables, eat foods that are low in salt, eat fruit for snacks and desserts  (06/28/2009)     Activity: take a 30 minute walk every day  (06/28/2009)     Other: walks the dog  (06/28/2009)    Hypertension self-management support: Education handout, Written self-care plan, Resources for patients handout  (06/28/2009)   Hypertension self-care plan printed.   Hypertension education handout printed    Lipid self-management support: Education handout, Written self-care plan, Resources for patients handout  (06/28/2009)   Lipid self-care plan printed.   Lipid education handout printed      Resource handout printed.   Appended Document: EST-2 MONTH CHECKUP/CH    Clinical Lists Changes  Problems: Assessed HYPOTHYROIDISM as comment only - Pt's TSH has been stable since 1/11. No new symptoms. Will refill her meds. She will need repeat TSH in 1/12.  Her updated medication list for this problem includes:    Levothyroxine Sodium 100 Mcg Tabs (Levothyroxine sodium) .Marland Kitchen... Take 1 tablet by mouth once a day  Medications: Rx of LEVOTHYROXINE SODIUM 100 MCG TABS  (LEVOTHYROXINE SODIUM) Take 1 tablet by mouth once a day;  #30 x 3;  Signed;  Entered by: Silvestre Gunner MD;  Authorized by: Silvestre Gunner MD;  Method used: Electronically to [facility] [address], [place], [place], [place]  [address], Ph: [phone], Fax: [phone]    Prescriptions: LEVOTHYROXINE SODIUM 100 MCG TABS (LEVOTHYROXINE SODIUM) Take 1 tablet by mouth once a day  #30 x 3   Entered and Authorized by:   Silvestre Gunner MD   Signed by:   Silvestre Gunner MD on 06/28/2009   Method used:   Electronically to        [facility] (retail)       [address]       [place], [place]  [address]       Ph: [phone]       Fax: [phone]   RxID:   [id number]     Impression & Recommendations:  Problem # 1:  HYPOTHYROIDISM (ICD-244.9) Pt's TSH has been stable since 1/11. No new symptoms. Will refill her meds. She will need repeat TSH in 1/12.  Her updated medication list for this problem includes:    Levothyroxine Sodium 100 Mcg Tabs (Levothyroxine sodium) .Marland Kitchen... Take 1 tablet by mouth once a day  Complete Medication List: 1)  Crestor 20 Mg Tabs (Rosuvastatin calcium) .... Take 1 tablet daily 2)  Atenolol 100 Mg Tabs (Atenolol) .... Take 1 tablet by mouth once a day 3)  Levothyroxine Sodium 100 Mcg Tabs (Levothyroxine sodium) .... Take 1 tablet by mouth once a day 4)  Amlodipine Besylate 10 Mg Tabs (Amlodipine besylate) .... Take 1 tablet by mouth once a day 5)  Voltaren 1 % Gel (Diclofenac sodium) .... Apply three times a day over your knees. 6)  Prevacid Solutab 30 Mg Tbdp (Lansoprazole) .... Take 1 tablet by mouth two times a day 7)  Furosemide 40 Mg Tabs (Furosemide) .... Take 1 tablet by mouth once a day 8)  Allopurinol 100 Mg Tabs (Allopurinol) .... Take 1 tablet by mouth once a day 9)  Potassium Chloride 20 Meq Pack (Potassium chloride) .... Take 1 pack daily 10)  Meclizine Hcl 25 Mg Tabs (Meclizine hcl) .... Take one  tab up to 4 times per day as needed for dizziness 11)  Ibuprofen 600 Mg Tabs (Ibuprofen) .... Take 600 mg every 8 hours for the next 5-7 days

## 2010-02-13 NOTE — Letter (Signed)
Summary: [facility]  [facility]   Imported By: Florinda Marker 03/20/2009 14:13:48  _____________________________________________________________________  External Attachment:    Type:   Image     Comment:   External Document

## 2010-02-13 NOTE — Progress Notes (Signed)
Summary: Prior Authorization  Phone Note Outgoing Call   Call placed by: Angelina Ok RN,  March 14, 2009 11:15 AM Call placed to: Specialist Summary of Call: Call to [employer] for Prior Approval of Prevacid.  Prevacid generic form is on preferred list.  Pt will need authorization for dosing intervals.  Form to be faxed with 10 day period for return. Angelina Ok RN  March 14, 2009 11:17 AM  Initial call taken by: Angelina Ok RN,  March 14, 2009 11:17 AM  Follow-up for Phone Call        Just let me know when the form arrived for me to sign it, or let me know which want they will like to pay for....?????

## 2010-02-18 ENCOUNTER — Ambulatory Visit: Payer: BC Managed Care – PPO

## 2010-02-21 ENCOUNTER — Telehealth: Payer: Self-pay | Admitting: *Deleted

## 2010-02-21 ENCOUNTER — Ambulatory Visit: Payer: BC Managed Care – PPO

## 2010-02-21 NOTE — Telephone Encounter (Signed)
Need something to replace prevacid 30 solutabs, it is no longer available

## 2010-02-24 ENCOUNTER — Other Ambulatory Visit: Payer: Self-pay | Admitting: *Deleted

## 2010-02-24 MED ORDER — ESOMEPRAZOLE MAGNESIUM 20 MG PO CPDR: 20.0000 mg | DELAYED_RELEASE_CAPSULE | Freq: Every day | ORAL | Status: DC

## 2010-02-24 NOTE — Telephone Encounter (Signed)
Last refill 02/12/10 Are you sure you want the dissolvable type?

## 2010-02-25 ENCOUNTER — Ambulatory Visit: Payer: BC Managed Care – PPO

## 2010-02-25 MED ORDER — LANSOPRAZOLE 30 MG PO TBDP: 30.0000 mg | ORAL_TABLET | Freq: Two times a day (BID) | ORAL | Status: DC

## 2010-02-25 NOTE — Telephone Encounter (Signed)
I called pt and she does use the dissolvable tabs. And she uses [facility]. Pharmacy changed.

## 2010-02-27 ENCOUNTER — Ambulatory Visit: Payer: BC Managed Care – PPO | Admitting: Physical Therapy

## 2010-03-04 ENCOUNTER — Ambulatory Visit: Payer: BC Managed Care – PPO | Admitting: Physical Therapy

## 2010-03-06 ENCOUNTER — Ambulatory Visit: Payer: BC Managed Care – PPO | Admitting: Physical Therapy

## 2010-03-12 ENCOUNTER — Encounter: Payer: BC Managed Care – PPO | Admitting: Physical Therapy

## 2010-03-17 ENCOUNTER — Ambulatory Visit
Payer: BC Managed Care – PPO | Attending: Orthopedic Surgery | Admitting: Rehabilitative and Restorative Service Providers"

## 2010-03-17 DIAGNOSIS — M25669 Stiffness of unspecified knee, not elsewhere classified: Secondary | ICD-10-CM | POA: Insufficient documentation

## 2010-03-17 DIAGNOSIS — M25569 Pain in unspecified knee: Secondary | ICD-10-CM | POA: Insufficient documentation

## 2010-03-17 DIAGNOSIS — R262 Difficulty in walking, not elsewhere classified: Secondary | ICD-10-CM | POA: Insufficient documentation

## 2010-03-17 DIAGNOSIS — IMO0001 Reserved for inherently not codable concepts without codable children: Secondary | ICD-10-CM | POA: Insufficient documentation

## 2010-03-18 ENCOUNTER — Ambulatory Visit (INDEPENDENT_AMBULATORY_CARE_PROVIDER_SITE_OTHER): Payer: BC Managed Care – PPO | Admitting: Internal Medicine

## 2010-03-18 VITALS — BP 156/79 | HR 82 | Temp 97.2°F | Ht 60.0 in | Wt 194.0 lb

## 2010-03-18 DIAGNOSIS — R5383 Other fatigue: Secondary | ICD-10-CM

## 2010-03-18 DIAGNOSIS — E785 Hyperlipidemia, unspecified: Secondary | ICD-10-CM

## 2010-03-18 DIAGNOSIS — R5381 Other malaise: Secondary | ICD-10-CM

## 2010-03-18 DIAGNOSIS — R011 Cardiac murmur, unspecified: Secondary | ICD-10-CM

## 2010-03-18 DIAGNOSIS — I10 Essential (primary) hypertension: Secondary | ICD-10-CM

## 2010-03-18 DIAGNOSIS — R002 Palpitations: Secondary | ICD-10-CM

## 2010-03-18 DIAGNOSIS — E039 Hypothyroidism, unspecified: Secondary | ICD-10-CM

## 2010-03-18 DIAGNOSIS — I38 Endocarditis, valve unspecified: Secondary | ICD-10-CM

## 2010-03-18 LAB — CBC WITH DIFFERENTIAL/PLATELET
Basophils Absolute: 0 10*3/uL (ref 0.0–0.1)
Basophils Relative: 0 % (ref 0–1)
Eosinophils Absolute: 0 10*3/uL (ref 0.0–0.7)
Eosinophils Relative: 1 % (ref 0–5)
HCT: 42.6 % (ref 36.0–46.0)
MCH: 27.7 pg (ref 26.0–34.0)
MCHC: 32.6 g/dL (ref 30.0–36.0)
MCV: 84.9 fL (ref 78.0–100.0)
Monocytes Absolute: 0.2 10*3/uL (ref 0.1–1.0)
RDW: 14.1 % (ref 11.5–15.5)

## 2010-03-18 LAB — COMPREHENSIVE METABOLIC PANEL
ALT: 26 U/L (ref 0–35)
AST: 23 U/L (ref 0–37)
Chloride: 102 mEq/L (ref 96–112)
Creat: 0.68 mg/dL (ref 0.40–1.20)
Sodium: 140 mEq/L (ref 135–145)
Total Bilirubin: 0.5 mg/dL (ref 0.3–1.2)
Total Protein: 7.8 g/dL (ref 6.0–8.3)

## 2010-03-18 LAB — TSH: TSH: 2.295 u[IU]/mL (ref 0.350–4.500)

## 2010-03-18 NOTE — Progress Notes (Signed)
  Subjective:    Patient ID: Lydia Walsh, female    DOB: [birth year], 55 y.o.   MRN: [id number]  HPI Ms Pullium is here with complaint of fatigue and malaise of 3 week duration.  Her chart and problem list reviewed. 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 She does note occasional palpitations. No orthastasis, presyncope    Review of Systems  As above.    Objective:   Physical Exam    Pleasant woman NAD HEENT normal except for murmer radiating to carotids bilateraly She has a cresch-decrechendo murmer at R/LUSB with ? Trace diastolic decrechendo murmer. PMI not siplaced, no gallop Abd NE Ext no edema    Assessment & Plan:   Problems:  1. Murmur with concerns for possible AS, mild AI 2. Fatigue and Malaise (with prior history of the same)             3. Obesity and Deconditioning (history of knee pain and osteoarthritis of knees) 4. Overdue Prevention Measures (mammography, ?colonoscopy) 5. HTN 6. Dyslipidemia 7. Hypothyroidism 8. Borderline potassium values, most recently 4/11 9. History of Sleep Study (April 2010) with CPAP titration (see scanned report) although patient reports she is not on CPAP  Plan: 2-D echo Lab panel Refill Meds only after labs back Have patient RTC after echo and she will need review of her CPAP needs.   Will need routine health surveillance addressed by her primary MD

## 2010-03-21 ENCOUNTER — Ambulatory Visit: Payer: BC Managed Care – PPO

## 2010-03-24 ENCOUNTER — Ambulatory Visit: Payer: BC Managed Care – PPO

## 2010-03-25 ENCOUNTER — Ambulatory Visit (HOSPITAL_COMMUNITY): Payer: BC Managed Care – PPO

## 2010-03-25 ENCOUNTER — Other Ambulatory Visit: Payer: Self-pay | Admitting: Internal Medicine

## 2010-03-27 ENCOUNTER — Encounter: Payer: BC Managed Care – PPO | Admitting: Physical Therapy

## 2010-04-02 ENCOUNTER — Ambulatory Visit (HOSPITAL_COMMUNITY)
Admission: RE | Admit: 2010-04-02 | Discharge: 2010-04-02 | Disposition: A | Discharge status: Home / Self Care | Payer: BC Managed Care – PPO | Source: Ambulatory Visit | Attending: Internal Medicine | Admitting: Internal Medicine

## 2010-04-02 ENCOUNTER — Other Ambulatory Visit: Payer: Self-pay | Admitting: Internal Medicine

## 2010-04-02 DIAGNOSIS — E785 Hyperlipidemia, unspecified: Secondary | ICD-10-CM | POA: Insufficient documentation

## 2010-04-02 DIAGNOSIS — R011 Cardiac murmur, unspecified: Secondary | ICD-10-CM | POA: Insufficient documentation

## 2010-04-02 DIAGNOSIS — I10 Essential (primary) hypertension: Secondary | ICD-10-CM | POA: Insufficient documentation

## 2010-04-02 HISTORY — PX: TRANSTHORACIC ECHOCARDIOGRAM: SHX275

## 2010-04-03 ENCOUNTER — Ambulatory Visit: Payer: BC Managed Care – PPO

## 2010-04-04 ENCOUNTER — Telehealth: Payer: Self-pay | Admitting: *Deleted

## 2010-04-04 NOTE — Telephone Encounter (Signed)
Solutabs currently not available.  Pharmacy would like to fill Lansoprazole delayed release capsules 30 mg.

## 2010-04-06 MED ORDER — LANSOPRAZOLE 30 MG PO TBDP: 30.0000 mg | ORAL_TABLET | Freq: Two times a day (BID) | ORAL | Status: DC

## 2010-04-08 ENCOUNTER — Telehealth: Payer: Self-pay | Admitting: *Deleted

## 2010-04-08 NOTE — Progress Notes (Signed)
Pt notified of echo results

## 2010-04-08 NOTE — Telephone Encounter (Signed)
Message copied by Kingsley Spittle on Tue Apr 08, 2010  8:56 AM ------      Message from: Reche Dixon, DAVID      Created: Fri Apr 04, 2010  2:58 PM       Please notify patient: Echo is completely normal

## 2010-04-08 NOTE — Telephone Encounter (Signed)
Both prevacid and nexuim on med list. Nexium just filled 02/24/10. Why can't pt just use nexium?

## 2010-04-08 NOTE — Telephone Encounter (Signed)
Pt contacted with echo results. Phone call complete.

## 2010-04-09 ENCOUNTER — Ambulatory Visit: Payer: BC Managed Care – PPO

## 2010-04-20 LAB — BASIC METABOLIC PANEL WITH GFR
BUN: 15 mg/dL (ref 6–23)
CO2: 25 meq/L (ref 19–32)
Calcium: 9.5 mg/dL (ref 8.4–10.5)
Chloride: 106 meq/L (ref 96–112)
Creatinine, Ser: 0.85 mg/dL (ref 0.4–1.2)
GFR calc Af Amer: >60 mL/min (ref >60)
GFR calc non Af Amer: >60 mL/min (ref >60)
Glucose, Bld: 113 mg/dL — ABNORMAL HIGH (ref 70–99)
Potassium: 3.7 meq/L (ref 3.5–5.1)
Sodium: 140 meq/L (ref 135–145)

## 2010-04-20 LAB — DIFFERENTIAL
Basophils Absolute: 0 10*3/uL (ref 0.0–0.1)
Eosinophils Relative: 2 % (ref 0–5)
Lymphocytes Relative: 24 % (ref 12–46)
Neutrophils Relative %: 72 % (ref 43–77)

## 2010-04-20 LAB — CBC
HCT: 41 % (ref 36.0–46.0)
Platelets: 187 10*3/uL (ref 150–400)
RDW: 15.1 % (ref 11.5–15.5)

## 2010-04-20 LAB — POCT CARDIAC MARKERS
CKMB, poc: <1 ng/mL — ABNORMAL LOW (ref 1.0–8.0)
Myoglobin, poc: 45.7 ng/mL (ref 12–200)
Troponin i, poc: <0.05 ng/mL (ref 0.00–0.09)

## 2010-05-09 ENCOUNTER — Encounter: Payer: Self-pay | Admitting: Ophthalmology

## 2010-05-09 ENCOUNTER — Ambulatory Visit (INDEPENDENT_AMBULATORY_CARE_PROVIDER_SITE_OTHER): Payer: BC Managed Care – PPO | Admitting: Ophthalmology

## 2010-05-09 ENCOUNTER — Ambulatory Visit: Payer: BC Managed Care – PPO | Admitting: Ophthalmology

## 2010-05-09 DIAGNOSIS — I10 Essential (primary) hypertension: Secondary | ICD-10-CM

## 2010-05-09 DIAGNOSIS — G4733 Obstructive sleep apnea (adult) (pediatric): Secondary | ICD-10-CM

## 2010-05-09 DIAGNOSIS — E669 Obesity, unspecified: Secondary | ICD-10-CM

## 2010-05-09 DIAGNOSIS — K219 Gastro-esophageal reflux disease without esophagitis: Secondary | ICD-10-CM

## 2010-05-09 MED ORDER — LANSOPRAZOLE 30 MG PO TBDP: 30.0000 mg | ORAL_TABLET | Freq: Two times a day (BID) | ORAL | Status: DC

## 2010-05-09 MED ORDER — AMLODIPINE BESYLATE 10 MG PO TABS: 10.0000 mg | ORAL_TABLET | Freq: Every day | ORAL | Status: DC

## 2010-05-09 MED ORDER — FUROSEMIDE 40 MG PO TABS: 40.0000 mg | ORAL_TABLET | Freq: Every day | ORAL | Status: DC

## 2010-05-09 MED ORDER — ATENOLOL 100 MG PO TABS: 100.0000 mg | ORAL_TABLET | Freq: Every day | ORAL | Status: DC

## 2010-05-09 MED ORDER — LEVOTHYROXINE SODIUM 100 MCG PO TABS: 100.0000 ug | ORAL_TABLET | Freq: Every day | ORAL | Status: DC

## 2010-05-09 MED ORDER — ROSUVASTATIN CALCIUM 20 MG PO TABS: 20.0000 mg | ORAL_TABLET | Freq: Every day | ORAL | Status: DC

## 2010-05-09 NOTE — Assessment & Plan Note (Signed)
The patient is very interested in weight loss, and I spent approximately 10 minutes counseling the patient regarding this today. The patient states that she plans to initiate a diet, as well as increase her exercise beginning today. I recommended the patient followup in one month to evaluate how things are going. I will refer the patient for nutritional counseling at this time, as the patient was interested in this, however is unclear whether or not the patient's insurance will cover this, and she plans to call the company and then set up an appointment if they're willing to pay.

## 2010-05-09 NOTE — Progress Notes (Signed)
Subjective:    Patient ID: Lydia Walsh, female    DOB: 02-Nov-1955, 55 y.o.   MRN: [id number]  HPI  This is a 55 year old female with past medical history significant for hypertension who presents for followup for her test results. The patient was last seen by Dr. Reche Dixon on 3/6 because of increased fatigue. Basic labs as well as a TSH were checked and were all normal. Because of a systolic murmur, the patient also had evaluation with 2-D echo, which was also normal. The patient continues to complain of fatigue, but her primary concern today was a desire to lose weight. The patient is not currently on a diet, and does not get any significant exercise during the day.  With regards to her fatigue, and the differential diagnosis given the patient's daytime somnolence and, also includes OSA, the patient was previously evaluated for sleep apnea, and had an apnea-hypopnea index of 18 and CPAP was titrated to 13. The patient has not ever use CPAP as it was never initiated by her provider.  Review of Systems  Constitutional: Negative for fever and chills.  Respiratory: Negative for cough and shortness of breath.   Cardiovascular: Negative for chest pain.  Gastrointestinal: Negative for vomiting, diarrhea and constipation.       Objective:   Physical Exam  Constitutional: She appears well-developed and well-nourished.  HENT:  Head: Normocephalic and atraumatic.  Eyes: Pupils are equal, round, and reactive to light.  Cardiovascular: Normal rate, regular rhythm and intact distal pulses.  Exam reveals no gallop and no friction rub.   Murmur heard.      There is a 2/6 systolic murmur  Pulmonary/Chest: Effort normal and breath sounds normal. She has no wheezes. She has no rales.  Abdominal: Soft. Bowel sounds are normal. She exhibits no distension. There is no tenderness.  Musculoskeletal: Normal range of motion.  Neurological: She is alert. No cranial nerve deficit.  Skin: No rash noted.          Current Outpatient Prescriptions on File Prior to Visit  Medication Sig Dispense Refill  . allopurinol (ZYLOPRIM) 100 MG tablet Take 100 mg by mouth daily.        . diclofenac sodium (VOLTAREN) 1 % GEL Apply topically 3 (three) times daily. Over your knees       . EPIKLOR 20 MEQ packet Dissolve one package in 4 ounces of water or other beverage every day      . esomeprazole (NEXIUM) 20 MG capsule Take 1 capsule (20 mg total) by mouth every morning before breakfast.  30 capsule  11  . meclizine (ANTIVERT) 25 MG tablet Take 25 mg by mouth. Up to 4 times per day as needed for dizziness       . DISCONTD: amLODipine (NORVASC) 10 MG tablet       . DISCONTD: atenolol (TENORMIN) 100 MG tablet       . DISCONTD: CRESTOR 20 MG tablet TAKE ONE TABLET BY MOUTH EVERY DAY  30 each  6  . DISCONTD: furosemide (LASIX) 40 MG tablet       . DISCONTD: lansoprazole (PREVACID SOLUTAB) 30 MG disintegrating tablet Take 1 tablet (30 mg total) by mouth 2 (two) times daily.  60 tablet  11  . DISCONTD: levothyroxine (SYNTHROID, LEVOTHROID) 100 MCG tablet TAKE ONE TABLET BY MOUTH EVERY DAY  30 tablet  6    Patient Active Problem List  Diagnoses  . HYPOTHYROIDISM  . DYSLIPIDEMIA  . GOUT  . OBSTRUCTIVE SLEEP APNEA  .  LOSS, SENSORINEURAL HEAR, ASYMMETRICAL  . HYPERTENSION  . ALLERGIC RHINITIS, SEASONAL  . G E R D  . OSTEOARTHRITIS  . KNEE PAIN  . LOW BACK PAIN, CHRONIC  . Urge incontinence    Assessment & Plan:

## 2010-05-09 NOTE — Assessment & Plan Note (Signed)
The patients blood pressure was within reasonable control today (BP: 128/80 mmHg ) and I will not make any adjustments to the patients anti-hypertensive regimen. I will continue to monitor and titrate the patients medications as needed at future visits.

## 2010-05-09 NOTE — Patient Instructions (Signed)
Please contact her insurance company and call the clinic at [phone], if they cover a nutrition visit. Please continue to try to diet and exercise as we discussed. I would like you to followup in one month to discuss your progress. Home health will come set up your CPAP for sleep apnea.

## 2010-05-09 NOTE — Assessment & Plan Note (Signed)
The patient feels that this is deteriorating, the patient states that she was previously on Prevacid Solutab, but [facility] no longer carries it. The patient felt that this worked much better than her Nexium. I have refilled the patient's prescription today in printed form so that she can locate a pharmacy that carries the medication.

## 2010-05-09 NOTE — Assessment & Plan Note (Signed)
The patient has moderate sleep apnea, and she had titration of her CPAP in March of 2010. The patient was titrated to a pressure of 13. Given the patient's increased fatigue, and daytime somnolence, I feel is appropriate to initiate treatment with CPAP. I have referred the patient for home health care to set up the service. This is one likely possibility for the patient's increased fatigue, and I recommended that she return in one month after initiation of her treatment for further evaluation.

## 2010-05-21 ENCOUNTER — Telehealth: Payer: Self-pay | Admitting: Dietician

## 2010-05-21 NOTE — Telephone Encounter (Signed)
Patient called to tell us she verified that her insurance does cover 4 visits a year for Medical Nutrition Therapy. If outpatient, then a certain percentage will be covered vs if office visit them 35.00 copay. Her year goes from 07/12/09 to 07/12/10. Will route this to front office to schedule visit per patient request.

## 2010-05-27 NOTE — Op Note (Signed)
Lydia Walsh, Lydia Walsh                ACCOUNT NO.:  [id number]   MEDICAL RECORD NO.:  [id number]          PATIENT TYPE:  AMB   LOCATION:  DSC                          FACILITY:  [facility]   PHYSICIAN:  Harvie Junior, M.D.   DATE OF BIRTH:  Aug 05, 1955   DATE OF PROCEDURE:  12/29/2006  DATE OF DISCHARGE:  12/29/2006                               OPERATIVE REPORT   PREOPERATIVE DIAGNOSIS:  Medial meniscal tear.   POSTOPERATIVE DIAGNOSES:  1. Medial meniscal tear.  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  3. Chondromalacia patella.   OPERATION PERFORMED:  1. Partial posterior horn medial meniscectomy with corresponding      debridement of medial femoral compartment.  2. Debridement of anterior horn lateral meniscoid tear with      debridement of meniscoid remnant back to a C shape and      corresponding debridement in lateral compartment.  3. Chondroplasty of the patellofemoral compartment.   SURGEON:  Harvie Junior, M.D.   ASSISTANT:  Marshia Ly, P.A.   ANESTHESIA:  General.   INDICATIONS FOR PROCEDURE:  [name] is a 55 year old female with a  long history of having had significant left knee problems.  She had a  right knee arthroscopy which showed that she had a posterior horn medial  meniscal tear and a Discoid lateral meniscus.  We debrided those  structures and she did wonderfully.  Because of continue complaints of  pain with the left knee talked about treatment options, ultimately felt  that arthroscopic intervention is most appropriate course of action.  She is brought to the operating room for this procedure.   DESCRIPTION OF PROCEDURE:  The patient was brought to the operating room  and after adequate anesthesia obtained with general anesthetic, patient  is placed supine on the operating table.  The right leg was prepped and  draped in the usual sterile fashion.  Following this, routine  arthroscopic examination revealed that there was an obvious  Discoid  lateral meniscus.  This was debrided from its anterior horn tear back to  its smooth and stable rim.  Once that was completed, attention was then  turned towards the medial side where there was a classic posterior horn  medial meniscus with significant chondromalacia on the medial side.  This was debrided back to a smooth and stable rim.  There was  significant chondromalacia on the medial femoral condyle which was  debrided.  Patellofemoral compartment showed some grade 2  chondromalacia.  This was debrided back to stable rim in the  patellofemoral joint by way of chondroplasty.  At this point the knee  was copiously and thoroughly irrigated and suctioned dry.  The arthroscopic portals were then closed with a bandage.  A sterile  compressive dressing was applied and she was taken to recovery where she  was noted to be in satisfactory condition.   ESTIMATED BLOOD LOSS:  None.      Harvie Junior, M.D.  Electronically Signed     JLG/MEDQ  D:  12/29/2006  T:  12/29/2006  Job:  [id number]

## 2010-05-27 NOTE — Procedures (Signed)
[name], [name]                ACCOUNT NO.:  [id number]   MEDICAL RECORD NO.:  [id number]          PATIENT TYPE:  OUT   LOCATION:  SLEEP CENTER                 FACILITY:  [facility]   PHYSICIAN:  Clinton D. Maple Hudson, MD, FCCP, FACPDATE OF BIRTH:  January 28, 1955   DATE OF STUDY:  04/13/2008                            NOCTURNAL POLYSOMNOGRAM   REFERRING PHYSICIAN:  Olene Craven, M.D.   INDICATIONS FOR STUDY:  Hypersomnia with sleep apnea.   EPWORTH SLEEPINESS SCORE:  Epworth sleepiness score 4/24.  BMI 35.3.  Weight 181 pounds.  Height 60 inches.  Neck 14 inches.   HOME MEDICATIONS:  Not listed.   A previous diagnostic NPSG on June 20, 2005, had recorded an a AHI of  26.6 per hour.  CPAP titration is now requested.   SLEEP ARCHITECTURE:  Total sleep time 208 minutes with sleep efficiency  55.4%.  Stage I was 17.8%, stage II 55.3%, stage III 10.8%, and REM  16.1% of total sleep time.  Sleep latency 14 minutes, REM latency 297  minutes, awake after sleep onset 152 minutes, and arousal index sleep  architecture was marked by very frequent brief waking throughout the  night.  Sustained sleep was not obtained until 3 a.m.  No bedtime  medication was reported.   RESPIRATORY DATA:  CPAP titration protocol.  CPAP was titrated to 13  CWP, AHI 1.8 per hour.  She wore a medium ResMed full-face Quattro mask  with heated humidifier.   OXYGEN DATA:  Moderate snoring before CPAP with oxygen desaturation to a  nadir of 90%.  With CPAP control, snoring was prevented, and mean oxygen  saturation held 95.3% on room air.   CARDIAC DATA:  Sinus rhythm.   MOVEMENT/PARASOMNIA:  No significant movement disturbance.  Bathroom x1.   IMPRESSION/RECOMMENDATIONS:  1. Successful CPAP titration to 13 CWP, AHI 1.8 per hour.  She wore a      medium ResMed full-face Quattro mask      with heated humidifier.  2. Baseline diagnostic nocturnal polysomnogram on June 20, 2005, had      recorded an AHI of 26.6 per  hour.      Clinton D. Maple Hudson, MD, [facility], FACP  Diplomate, Biomedical engineer of Sleep Medicine  Electronically Signed     CDY/MEDQ  D:  04/21/2008 10:24:55  T:  04/21/2008 22:21:57  Job:  [id number]

## 2010-05-28 NOTE — Telephone Encounter (Signed)
Appointment made

## 2010-05-30 NOTE — Procedures (Signed)
NAMEVINCENZA, DAIL                ACCOUNT NO.:  [id number]   MEDICAL RECORD NO.:  [id number]            PATIENT TYPE:   LOCATION:  SLEEP CENTER                   FACILITY:   PHYSICIAN:  Clinton D. Young, M.D.      DATE OF BIRTH:   DATE OF STUDY:                              NOCTURNAL POLYSOMNOGRAM   REFERRING PHYSICIAN:  Dr. Mont Dutton.   INDICATIONS FOR STUDY:  Hypersomnia with sleep apnea.  Epworth sleepiness  score 6/24, BMI 37.8.  Weight 193 pounds.   HOME MEDICATIONS:  Atenolol, levothyroxine, triamterene/HCTZ, Ferrex,  Lipitor.  A diagnostic NPSG protocol was ordered.   SLEEP ARCHITECTURE:  Short total sleep time 182 minutes with sleep  efficiency 44%.  Stage I was 27%, stage II 73%, stages III, IV and REM were  absent.  Sleep latency 48 minutes, awake after sleep onset 185 minutes with  very frequent awakenings throughout the night.  Arousal index 75.9,  indicating increased fragmentation.  Technician indicates the patient had  very hard time sleeping due to back pain and the patient indicated sleep  quality was worse than usual.  No bedtime medication was reported.   RESPIRATORY DATA:  Apnea/hypopnea index (AHI, RDI) 26.6 obstructive events  per hour indicating moderate obstructive sleep apnea/hypopnea syndrome.  There were 6 obstructive apneas and 75 hypopneas.  Most sleep and most  events were recorded while supine.  NPSG diagnostic protocol was followed.   OXYGEN DATA:  Mild snoring with oxygen desaturation to a nadir of 87%.  Mean  oxygen saturation through the study was 93% on room air.   CARDIAC DATA:  Normal sinus rhythm.   MOVEMENT/PARASOMNIA:  A total of 38 limb jerks were recorded of which 12  were associated with arousal or awakening for periodic limb movement with  arousal index of 3.9, which is mildly increased.  Bathroom x1.   IMPRESSION/RECOMMENDATIONS:  1.  Moderate obstructive sleep apnea/hypopnea syndrome, API 26.6 per hour      with most events  and most sleep recorded while supine.  Mild snoring      with oxygen desaturation to a nadir of 87%.  2.  Consider return for continuous positive airway pressure titration or      evaluate for alternative therapies as appropriate.  3.  Significantly fragmented and shorten sleep time with many awakenings.      Much of this may be associated with respiratory events but there was      apparently substantial discomfort related to back pain and this should      be      considered in her sleep management.  If she returns for continuous      positive airway pressure titration, suggest bringing a sleep medication      and possibly also an appropriate analgesic.      Clinton D. Maple Hudson, M.D.  Diplomate, Biomedical engineer of Sleep Medicine  Electronically Signed     CDY/MEDQ  D:  06/21/2005 10:34:19  T:  06/22/2005 08:28:51  Job:  [id number]   cc:   Mont Dutton, MD  Fax: [phone]

## 2010-05-30 NOTE — Op Note (Signed)
NAMEHUGH, Lydia Walsh                          ACCOUNT NO.:  [id number]   MEDICAL RECORD NO.:  [id number]                   PATIENT TYPE:  AMB   LOCATION:  SDC                                  FACILITY:  [facility]   PHYSICIAN:  Juluis Mire, M.D.                DATE OF BIRTH:  1955-09-23   DATE OF PROCEDURE:  12/13/2002  DATE OF DISCHARGE:                                 OPERATIVE REPORT   PREOPERATIVE DIAGNOSIS:  Abnormal uterine bleeding with uterine fibroids,  possible endometrial polyp versus submucosal fibroid.   POSTOPERATIVE DIAGNOSIS:  Abnormal uterine bleeding with uterine fibroids,  possible endometrial polyp versus submucosal fibroid, with pathology  pending.   OPERATIVE PROCEDURE:  1. Hysteroscopy, resection of intrauterine polyps and/or pedunculated     intrauterine fibroids.  2. Endometrial curettings.   SURGEON:  Juluis Mire, M.D.   ANESTHESIA:  General.   ESTIMATED BLOOD LOSS:  Minimal.   PACKS AND DRAINS:  None.   INTRAOPERATIVE BLOOD REPLACED:  None.   COMPLICATIONS:  None.   INDICATIONS:  As dictated in the history and physical.   DESCRIPTION OF PROCEDURE:  The patient was taken to the OR and placed in the  supine position.  After a satisfactory level of general anesthesia, the  patient was placed in the dorsal lithotomy position using the Allen  stirrups.  The perineum and vagina were prepped out with Betadine.  A  speculum was placed in the vaginal vault.  The cervix was grasped with a  single-tooth tenaculum.  The uterus sounded to approximately 10 cm.  The  cervix was serially dilated to a size 35 Pratt dilator.  Operative  hysteroscope was then introduced and the intrauterine cavity was distended  using sorbitol.  Several large clots were noted in the intrauterine cavity  and removed.  On the posterior wall there were two apparent submucosal  fibroids extending into the intrauterine cavity.  Both of these were  resected in total and sent for  pathologic review.  No other intrauterine  pathology was noted.  The endometrium was very atrophic and thin.  Endometrial curettings were obtained.  There was no sign of complications or  perforations.  At this point in time the hysteroscope and single-tooth  tenaculum and speculum were removed.  The patient was taken out of the  dorsal lithotomy position, once alert and extubated transferred to the  recovery room in good condition.  The sponge, instrument, and needle count  were reported as correct by the circulating nurse.                                               Juluis Mire, M.D.    JSM/MEDQ  D:  12/13/2002  T:  12/13/2002  Job:  [id number]

## 2010-05-30 NOTE — H&P (Signed)
NAME:  Lydia Walsh, Lydia Walsh                          ACCOUNT NO.:  [id number]   MEDICAL RECORD NO.:  [id number]                   PATIENT TYPE:  AMB   LOCATION:  SDC                                  FACILITY:  [facility]   PHYSICIAN:  Juluis Mire, M.D.                DATE OF BIRTH:  1955-09-02   DATE OF ADMISSION:  DATE OF DISCHARGE:                                HISTORY & PHYSICAL   HISTORY OF PRESENT ILLNESS:  A 55 year old gravida 3 para 3 female referred  to [facility] for evaluation of abnormal uterine bleeding.   The patient has had a history of abnormal uterine bleeding.  She is also  known to have uterine fibroids.  She did have a saline infusion ultrasound.  Multiple fibroids were noted.  She did have two large masses within the  endometrial cavity consistent either with submucosal fibroids or endometrial  polyp.  Ovaries appeared to be normal except for some small functional  cysts.  She now presents for hysteroscopic resection of endometrial polyps  and/or fibroids.   ALLERGIES:  No known drug allergies.   MEDICATIONS:  Atenolol.  She is on triamterene combined with  hydrochlorothiazide, amitriptyline.   PAST MEDICAL HISTORY:  She does have a history of hypertension under active  management at [facility].  She has no other specific medical issues.   SURGICAL HISTORY:  She has had a benign breast mass removed from the right  breast and had a previous bilateral tubal ligation.   OBSTETRICAL HISTORY:  Three vaginal deliveries.   SOCIAL HISTORY:  Does reveal past history of tobacco use - she has quit.  No  alcohol use.   FAMILY HISTORY:  Mother with a history of hypertension as well as congestive  heart failure.   REVIEW OF SYSTEMS:  Noncontributory.   PHYSICAL EXAMINATION:  VITAL SIGNS:  The patient is afebrile with stable  vital signs.  HEENT:  Normocephalic.  Pupils equal, round, and reactive to light and  accommodation.  Extraocular movements were intact.  Sclerae  and conjunctivae  clear, oropharynx clear.  NECK:  Without thyromegaly.  BREASTS:  Not examined.  LUNGS:  Clear.  CARDIOVASCULAR:  Regular rhythm and rate with a grade 2/6 systolic ejection  murmur.  No clicks or gallops.  ABDOMEN:  Benign.  No mass, organomegaly, or tenderness.  PELVIC:  Normal external genitalia, vaginal mucosa clear.  Cervix  unremarkable.  Uterus is upper limits of normal size, irregular, consistent  with known uterine fibroids.  Adnexa unremarkable.  EXTREMITIES:  Trace edema.  NEUROLOGIC:  Grossly within normal limits.   IMPRESSION:  1. Abnormal uterine bleeding with endometrial polyps and/or fibroids.  2. Hypertension.  3. Systolic heart murmur.   PLAN:  1. SBE prophylaxis.  2. Proceed with hysteroscopic evaluation and resection of polyps.   Risks of surgery have been discussed including anesthetic concerns, the risk  of infection, the risk of  hemorrhage that could necessitate transfusion with  the risk of AIDS or hepatitis.  Also, the potential risk for hysterectomy  should bleeding be excessive, the risk of injury to adjacent organs through  uterine perforation that could require laparoscopy with possible exploratory  laparotomy, risk of deep venous thrombosis and pulmonary embolus.  The  patient professed an understanding of the indications and risks.                                               Juluis Mire, M.D.    JSM/MEDQ  D:  12/13/2002  T:  12/13/2002  Job:  [id number]

## 2010-05-30 NOTE — H&P (Signed)
NAMEHONESTI, Lydia Walsh                ACCOUNT NO.:  [id number]   MEDICAL RECORD NO.:  [id number]          PATIENT TYPE:  AMB   LOCATION:  SDC                           FACILITY:  [facility]   PHYSICIAN:  Juluis Mire, M.D.   DATE OF BIRTH:  May 25, 1955   DATE OF ADMISSION:  DATE OF DISCHARGE:                                HISTORY & PHYSICAL   REASON FOR ADMISSION:  The patient is a 54 year old gravida 2, para 2,  female presents for hysteroscopy.   HISTORY OF PRESENT ILLNESS:  The patient was seen in the office for abnormal  bleeding.  She has a history of endometrial polyps.  She had a saline  infusion ultrasound which revealed multiple fibroids, the largest measuring  7.5 cm.  There was also a lot of intracavity masses with multiple polypoid  shapes.  This was consistent with recurrent polyps and the patient now  presents for hysteroscopic evaluation.   ALLERGIES:  In terms of allergies, the patient has no known drug allergies.   MEDICATIONS:  1.  Atenolol.  2.  Amitriptyline.  3.  She is also on a diuretic.   PAST MEDICAL HISTORY:  Significant for history of hypertension and past  history of anemia.   PAST SURGICAL HISTORY:  She has had a breast mass removed from the right  side. Had a previous bilateral tubal ligation.  She has had a previous  hysteroscopy in 2004.   OBSTETRICAL HISTORY:  She has had __________ vaginal deliveries.   FAMILY HISTORY:  Mother with history of congestive heart failure and  hypertension.   SOCIAL HISTORY:  Past history of tobacco use.  No alcohol use.   REVIEW OF SYSTEMS:  Noncontributory.   PHYSICAL EXAMINATION:  VITAL SIGNS:  Patient is afebrile and vital signs are  stable.  HEENT:  Patient is normocephalic.  Pupils equal, round, reactive to light  and accommodation. Extraocular movements were intact. Sclerae and  conjunctivae are clear. Oropharynx clear.  NECK:  Without thyromegaly.  BREAST:  No discrete masses.  LUNGS:  Clear.  CARDIOVASCULAR:  Regular rate and rhythm without murmurs or gallops.  ABDOMEN: Examination is benign.  No mass, organomegaly or tenderness.  PELVIC:  Normal external genitalia.  Vaginal cuff clear.  Cervix  unremarkable.  Uterus upper limits normal in size, irregular, consistent  with multiple fibroids.  Adnexa unremarkable.  EXTREMITIES:  Trace edema.  NEUROLOGICAL:  Exam grossly within normal limits.   IMPRESSION:  1.  Menorrhagia.  Abnormal bleeding with associated endometrial polyps.  2.  Uterine fibroids.  3.  Hypertension.   PLAN:  The patient will undergo hysteroscopic evaluation with resection of  the endometrial excrescences.  The risks of surgery have been discussed  including the risk of infection, the risk of hemorrhage, could require  transfusion, risk of AIDS or hepatitis.  Risks of injury to adjacent organs  could require further exploratory surgery.  Risk of deep venous thrombosis  and pulmonary embolus.  Patient expressed understanding of indications and  risks.      Juluis Mire, M.D.  Electronically Signed  JSM/MEDQ  D:  09/26/2004  T:  09/26/2004  Job:  [id number]

## 2010-05-30 NOTE — Op Note (Signed)
[name], Lydia Walsh                ACCOUNT NO.:  [id number]   MEDICAL RECORD NO.:  [id number]          PATIENT TYPE:  AMB   LOCATION:  SDC                           FACILITY:  [facility]   PHYSICIAN:  Juluis Mire, M.D.   DATE OF BIRTH:  1956-01-06   DATE OF PROCEDURE:  09/26/2004  DATE OF DISCHARGE:                                 OPERATIVE REPORT   PREOPERATIVE DIAGNOSES:  Endometrial polyps.   POSTOPERATIVE DIAGNOSES:  Probable intrauterine submucosal fibroids.   OPERATIVE PROCEDURE:  1.  Cervical dilation.  2.  Hysteroscopy resection of either endometrial polyps or fibroids.  3.  Endometrial curettings.   SURGEON:  Dr. Arelia Sneddon   ANESTHESIA:  General.   ESTIMATED BLOOD LOSS:  Minimal.   PACKS AND DRAINS:  None.   INTRAOPERATIVE BLOOD PLACED:  None.   COMPLICATIONS:  None.   INDICATIONS:  Dictated in the history and physical.   PROCEDURE:  Patient taken to the OR and placed in supine position.  After a  satisfactory level of general anesthesia was obtained patient was placed in  the dorsal lithotomy position using the Allen stirrups.  At this point in  time perineum and vagina were prepped out with Betadine and draped in a  sterile field.  A speculum was placed in the vaginal vault, the cervix  grasped with a single tooth tenaculum.  The uterus sounded to approximately  9-10 cm.  Cervix was serially dilated to size 35 Pratt dilator.  Operative  hysteroscope was introduced into intrauterine cavity and distended using  sorbitol.  Visualization revealed multiple outgrowths from the uterine wall.  On the posterior wall to the left side she had a definite intramural  submucosal fibroid.  The rest of these were more superficial and small.  We  went ahead and resected all these areas and sent for pathologic review.  We  also resected into the larger fibroid and sent that for pathologic review.  At the end of the procedure we felt like we got all the abnormalities out of  the  endometrial cavity and again I think these are fibroids, not true  polyps.  Endometrial curettings were then  obtained.  The single tooth tenaculum and speculum were then removed,  patient taken out of the dorsal lithotomy position.  Once alert and  extubated, transferred to recovery room in good condition.  Sponge,  instrument, needle count reported as correct by circulating nurse.      Juluis Mire, M.D.  Electronically Signed     JSM/MEDQ  D:  09/26/2004  T:  09/26/2004  Job:  [id number]

## 2010-05-30 NOTE — Op Note (Signed)
[name], Lydia Walsh                ACCOUNT NO.:  [id number]   MEDICAL RECORD NO.:  [id number]          PATIENT TYPE:  AMB   LOCATION:  DSC                          FACILITY:  [facility]   PHYSICIAN:  Harvie Junior, M.D.   DATE OF BIRTH:  06-08-1955   DATE OF PROCEDURE:  04/12/2006  DATE OF DISCHARGE:                               OPERATIVE REPORT   PREOPERATIVE DIAGNOSES:  1. Medial meniscal tear.  2. Discoid lateral meniscus.   POSTOPERATIVE DIAGNOSES:  1. Medial meniscal tear.  2. Discoid lateral meniscus.  3. Chondromalacia of patella.   PRINCIPAL PROCEDURE:  1. Partial posterior horn medial meniscectomy with corresponding      debridement of medial compartment.  2. Partial lateral meniscectomy with debridement of discoid lateral      meniscus.  3. Debridement of chondromalacia of patella.   SURGEON:  Harvie Junior, M.D.   ASSISTANT:  Marshia Ly, P.A.   ANESTHESIA:  General.   BRIEF HISTORY:  The patient is a 55 year old female with a long history  of having had significant medial side pain.  She was ultimately treated  conservatively.  MRI was obtained which showed a questionable medial  meniscal tear.  In my mind it was not questionable.  At any rate, she  failed conservative care and because of continued complaints of pain and  catching and locking, she was taken to the operating room for operative  intervention.  She was brought to the operating room for fixation of  this problem.   DESCRIPTION OF PROCEDURE:  The patient was taken to the operating room  and after adequate anesthesia was obtained with general anesthetic, the  patient was placed on the operating room table.  The right leg was  prepped and draped in the usual sterile fashion.  Following this routine  arthroscopic examination of the knee revealed that there was an obvious  posterior horn medial meniscal tear.  This was debrided back to a smooth  and stable rim and contoured down with a suction  shaver.  Attention was  then turned to the medial femoral condyle which had some grade 2 changes  which were debrided.  ACL was normal.  Attention was turned laterally  with an obvious discoid lateral meniscus.  There was a complex portion  of a tear in this in the mid body and we began dissecting the D into a C  shape.  This gave way to the complex portion of the meniscus tear.  At  this point, once this was completed, the meniscus was in a nice C shape,  the complex portion had been debrided and the lateral femoral condyle  had no significant chondromalacia.  A probe was used to make sure the  meniscus was good and stable peripherally.   Attention was turned back to the patellofemoral joint where some grade 2  changes were debrided with the suction shaver.  At this point the knee  was copiously and thoroughly irrigated and suctioned dry.  The  arthroscopic portals  were closed and a sterile compressive dressing was applied.  The patient  was taken to the recovery room.  She was noted to be in satisfactory  condition.   ESTIMATED BLOOD LOSS:  __________      Harvie Junior, M.D.  Electronically Signed     JLG/MEDQ  D:  04/12/2006  T:  04/13/2006  Job:  [id number]

## 2010-06-03 ENCOUNTER — Ambulatory Visit (INDEPENDENT_AMBULATORY_CARE_PROVIDER_SITE_OTHER): Payer: BC Managed Care – PPO | Admitting: Dietician

## 2010-06-03 ENCOUNTER — Ambulatory Visit: Payer: BC Managed Care – PPO | Admitting: Dietician

## 2010-06-03 DIAGNOSIS — E785 Hyperlipidemia, unspecified: Secondary | ICD-10-CM

## 2010-06-03 DIAGNOSIS — E669 Obesity, unspecified: Secondary | ICD-10-CM

## 2010-06-03 NOTE — Progress Notes (Signed)
Medical Nutrition Therapy:  Appt start time: 0830 end time:  0930.  Assessment:  Primary concerns today: Weight management and Meal planning Usual eating pattern includes Meal 2 and 2+ snacks per day. Estimated calorie intake 2000 + calories a day mostly from liquids and refined carbohydrates.  Fat from cream in coffee and sweets. Avoided foods include: fried foods, eats few fruist and vegetables, little whole grains and dairy.   Usual physical activity includes walking dog 20 minutes a day 5 days per week. 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   Diagnosis and Intervention:  Progress Towards Goal(s):  In progress   Nutritional Diagnosis:   NB 1.1 Food and nutrition related knowledge deficit as related to lack of prior exposure to information about how to read labels and  lack of self efficacy to make changes as evidenced by her statements of needing guidance in regards to weight loss knowledge.   Interventions:  1- Education about small steps, big rewards program for weight loss 2- Education about label reading and goals for calorie and fat gram  3- Education about risks and benefits of weight loss, healthy lifestyle. 4- Provided with Small steps,big rewards prevention of type 2 diabetes booklets and trained on keeping daily weights and food records.   Monitoring/Evaluation:  Dietary intake in 2 week(s) in June. Plan is for 3rd visit in 8-10 weeks in August/sepetmeber and 4th visit November 2012.

## 2010-06-03 NOTE — Patient Instructions (Signed)
Make a follow up in 2 weeks.  Bring your food record books.  Weigh your self daily and record in books also.  starting weight today 196 here in office.  We talked about eating fruit instead of sweets- especially at bedtime- must put on list to buy it before you can eat it.  Also getting a smaller coffee and asking for cream and sugar and adding it yourself.

## 2010-06-05 ENCOUNTER — Encounter: Payer: Self-pay | Admitting: Internal Medicine

## 2010-06-20 ENCOUNTER — Ambulatory Visit: Payer: BC Managed Care – PPO | Admitting: Dietician

## 2010-06-27 ENCOUNTER — Ambulatory Visit (INDEPENDENT_AMBULATORY_CARE_PROVIDER_SITE_OTHER): Payer: BC Managed Care – PPO | Admitting: Dietician

## 2010-06-27 VITALS — Ht 60.0 in | Wt 192.2 lb

## 2010-06-27 DIAGNOSIS — E669 Obesity, unspecified: Secondary | ICD-10-CM

## 2010-06-27 NOTE — Progress Notes (Signed)
Medical Nutrition Therapy:  Appt start time: 1030 end time:  1115.  Assessment:  Primary concerns today: Weight management and Blood sugar control Usual eating pattern includes Meal 2 and 2+ snacks per day.   Rood recall is eating morning meal, but doesn;t count this as she eats to take meds. Has decreased her coffee size to a small and stopped using cream and sugar. Using artifical sugar in her unsweet tea.  Avoided foods include: cookies and candy, soda and ice cream and desserts.   Usual physical activity includes walking 20 minutes a day 7 days per week  Diagnosis and Intervention:  Progress Towards Goal(s):  In progress   Nutritional Diagnosis:   NB-1.1 Food and nutrition-related knowledge deficit improving as evidenced by patient making healthier food choices, interest in changing her behavior .    Monitoring/Evaluation:  Dietary intake in 6 week(s)- 8 weeks

## 2010-06-27 NOTE — Patient Instructions (Signed)
Make a follow- up appointment for 6-8 weeks from today.  Continue to write down what you eat and drink, AND how many minutes each day you walk  Could walk dog 3 times a day for 20 minute walks or 2 times a day for 30 minutes a day.   Good Job with the WEIGHT LOSS of 3.8 pounds and eating breakfast!!!!  To lower your blood pressure:  Drain fluid of of cans of corn you have now if they are not LOW SODIUM Next time you buy them can buy either NO SALT ADDED corn or frozen  Garlic powder and onion powder are better than garlic salt and onion salt.

## 2010-07-07 ENCOUNTER — Inpatient Hospital Stay (INDEPENDENT_AMBULATORY_CARE_PROVIDER_SITE_OTHER)
Admission: RE | Admit: 2010-07-07 | Discharge: 2010-07-07 | Disposition: A | Discharge status: Home / Self Care | Payer: BC Managed Care – PPO | Source: Ambulatory Visit | Attending: Emergency Medicine | Admitting: Emergency Medicine

## 2010-07-07 DIAGNOSIS — IMO0002 Reserved for concepts with insufficient information to code with codable children: Secondary | ICD-10-CM

## 2010-07-26 ENCOUNTER — Encounter: Payer: Self-pay | Admitting: Internal Medicine

## 2010-07-26 DIAGNOSIS — M1990 Unspecified osteoarthritis, unspecified site: Secondary | ICD-10-CM | POA: Insufficient documentation

## 2010-08-29 ENCOUNTER — Other Ambulatory Visit: Payer: Self-pay | Admitting: Internal Medicine

## 2010-09-02 ENCOUNTER — Telehealth: Payer: Self-pay | Admitting: Dietician

## 2010-09-17 NOTE — Telephone Encounter (Signed)
Have not heard back from patient. Will provide support as appropriate to encourage patient readiness for behavior change

## 2010-10-10 ENCOUNTER — Telehealth: Payer: Self-pay | Admitting: *Deleted

## 2010-10-10 NOTE — Telephone Encounter (Signed)
Pt calls c/o cramping of feet, ankles and legs x 2-3 weeks, she states she takes K+ as prescribed, eats bananas and takes a spoon of mustard when she has cramps and the mustard seems to help a little. Desires an appt, given 10/3 at 0845, instructed to go to ED or urg care if becomes worse, she is agreeable

## 2010-10-15 ENCOUNTER — Ambulatory Visit (INDEPENDENT_AMBULATORY_CARE_PROVIDER_SITE_OTHER): Payer: BC Managed Care – PPO | Admitting: Internal Medicine

## 2010-10-15 ENCOUNTER — Encounter: Payer: BC Managed Care – PPO | Admitting: Internal Medicine

## 2010-10-15 DIAGNOSIS — M545 Low back pain, unspecified: Secondary | ICD-10-CM

## 2010-10-15 DIAGNOSIS — E785 Hyperlipidemia, unspecified: Secondary | ICD-10-CM

## 2010-10-15 DIAGNOSIS — G4733 Obstructive sleep apnea (adult) (pediatric): Secondary | ICD-10-CM

## 2010-10-15 DIAGNOSIS — M109 Gout, unspecified: Secondary | ICD-10-CM

## 2010-10-15 DIAGNOSIS — R252 Cramp and spasm: Secondary | ICD-10-CM

## 2010-10-15 DIAGNOSIS — I10 Essential (primary) hypertension: Secondary | ICD-10-CM

## 2010-10-15 DIAGNOSIS — K219 Gastro-esophageal reflux disease without esophagitis: Secondary | ICD-10-CM

## 2010-10-15 LAB — BASIC METABOLIC PANEL
CO2: 26 mEq/L (ref 19–32)
Chloride: 105 mEq/L (ref 96–112)
Glucose, Bld: 107 mg/dL — ABNORMAL HIGH (ref 70–99)
Potassium: 3.5 mEq/L (ref 3.5–5.3)
Sodium: 141 mEq/L (ref 135–145)

## 2010-10-15 MED ORDER — POTASSIUM CHLORIDE 20 MEQ PO PACK: 20.0000 meq | PACK | Freq: Every day | ORAL | Status: DC

## 2010-10-15 MED ORDER — ESOMEPRAZOLE MAGNESIUM 20 MG PO CPDR: 40.0000 mg | DELAYED_RELEASE_CAPSULE | Freq: Every day | ORAL | Status: DC

## 2010-10-15 MED ORDER — DICLOFENAC SODIUM 1 % TD GEL: 1.0000 "application " | Freq: Three times a day (TID) | TRANSDERMAL | Status: DC

## 2010-10-15 NOTE — Assessment & Plan Note (Signed)
She has tried Nexium 20 mg in the past but did not like it. It seems the insurance company does not pay for prevacid solu tab. She does not have a history of Peptic ulcer disease or H.pylori infection. I will try her again Nexium but 40 mg daily and see if it is working. If not we have to switch to something else.

## 2010-10-15 NOTE — Assessment & Plan Note (Addendum)
Will continue current regimen. She likes the medication although it is expensive.  Will check liver function test today.

## 2010-10-15 NOTE — Assessment & Plan Note (Signed)
Will check electrolytes and change management accordingly.

## 2010-10-15 NOTE — Progress Notes (Signed)
Subjective:   Patient ID: Lydia Walsh female   DOB: June 23, 1955 55 y.o.   MRN: [id number]  HPI: Lydia Walsh is a 55 y.o. female with PMH as outlined below who presented to the clinic with multiple complains. 1. Heart racing on multiple occasion but only while awaking up from sleep. She noted that sometimes she had the feeling that she was gasping for breath. At the same time she would also note headache, mostly in the back of her head. 3. Patient was noted to have sleep apnea in 2007. A repeat study was performed in 04/2008. Never used a CPAP. Patient noted that she thought it was not important.  Then during the office visit in 04/2010 patient was referred to Home health for CPAP machine but patient noted that no one contacted her.  2. Cramps in her legs: she noted that it is happening on a regular basis but when she drinks water the cramps improve. No swelling noted. 4. GERD: has been using prevacid solutab and she noted that it was working well but she was not able to get it .   Past Medical History  Diagnosis Date  . Hyperlipidemia   . Hypertension   . Thyroid disease     hypothyroidism, well controlled  . Anemia     iron deficiency anemia  . Osteoarthritis   . Sleep apnea    Current Outpatient Prescriptions  Medication Sig Dispense Refill  . allopurinol (ZYLOPRIM) 100 MG tablet Take 100 mg by mouth daily.        Marland Kitchen amLODipine (NORVASC) 10 MG tablet Take 1 tablet (10 mg total) by mouth daily.  30 tablet  11  . atenolol (TENORMIN) 100 MG tablet Take 1 tablet (100 mg total) by mouth daily.  30 tablet  11  . diclofenac sodium (VOLTAREN) 1 % GEL Apply topically 3 (three) times daily. Over your knees       . EPIKLOR 20 MEQ packet DISSOLVE ONE PACKET IN 4 OUNCES OF WATER OR OTHER BEVERAGE EVERY DAY  30 each  3  . esomeprazole (NEXIUM) 20 MG capsule Take 1 capsule (20 mg total) by mouth every morning before breakfast.  30 capsule  11  . furosemide (LASIX) 40 MG tablet Take 1 tablet (40  mg total) by mouth daily.  30 tablet  11  . HYDROcodone-acetaminophen (VICODIN) 5-500 MG per tablet       . lansoprazole (PREVACID SOLUTAB) 30 MG disintegrating tablet Take 1 tablet (30 mg total) by mouth 2 (two) times daily.  60 tablet  11  . levothyroxine (SYNTHROID, LEVOTHROID) 100 MCG tablet Take 1 tablet (100 mcg total) by mouth daily.  30 tablet  6  . meclizine (ANTIVERT) 25 MG tablet Take 25 mg by mouth. Up to 4 times per day as needed for dizziness       . rosuvastatin (CRESTOR) 20 MG tablet Take 1 tablet (20 mg total) by mouth daily.  30 tablet  11   No family history on file. History   Social History  . Marital Status: Divorced    Spouse Name: N/A    Number of Children: N/A  . Years of Education: N/A   Social History Main Topics  . Smoking status: Former Games developer  . Smokeless tobacco: Not on file   Comment: suit x 40yrs.  . Alcohol Use: No  . Drug Use: No  . Sexually Active: Not on file   Other Topics Concern  . Not on file   Social  History Narrative   Lives with her son.  Perimenopausal.   Review of Systems: Constitutional: Denies fever, chills, diaphoresis, appetite change and fatigue.  HEENT: Denies photophobia, eye pain, redness, hearing loss, ear pain, congestion, sore throat, rhinorrhea, sneezing, mouth sores, trouble swallowing, neck pain, neck stiffness and tinnitus.   Respiratory: Denies SOB, DOE, cough, chest tightness,  and wheezing.   Cardiovascular: Noted occasionally chest pain, palpitations and leg swelling.  Gastrointestinal: Denies nausea, vomiting, abdominal pain, diarrhea, constipation, blood in stool and abdominal distention.  Genitourinary: Denies dysuria, urgency, frequency, hematuria, flank pain and difficulty urinating.  Skin: Denies pallor, rash and wound.  Neurological: Denies dizziness, seizures, syncope, weakness, light-headedness, numbness but  headaches.    Objective:  Physical Exam: Filed Vitals:   10/15/10 0823  BP: 162/78  Pulse:  74  Temp: 97.5 F (36.4 C)  TempSrc: Oral  Height: 5' (1.524 m)  Weight: 195 lb (88.451 kg)   Constitutional: Vital signs reviewed.  Patient is a well-developed and well-nourished  in no acute distress and cooperative with exam. Alert and oriented x3.  Head: Normocephalic and atraumatic Ear: TM normal bilaterally Mouth: no erythema or exudates, MMM Eyes: PERRL, EOMI, conjunctivae normal, No scleral icterus.  Neck: Supple, Trachea midline normal ROM, No JVD, mass Cardiovascular: RRR, S1 normal, S2 normal, 2/6 systolic murmur, pulses symmetric and intact bilaterally Pulmonary/Chest: CTAB, no wheezes, rales, or rhonchi Abdominal: Soft. Non-tender, non-distended, bowel sounds are normal, no masses, organomegaly, or guarding present.  GU: no CVA tenderness Musculoskeletal: No joint deformities, erythema, or stiffness, ROM full and no nontender Hematology: no cervical, inginal, or axillary adenopathy.  Neurological: A&O x3, Strenght is normal and symmetric bilaterally, cranial nerve II-XII are grossly intact, no focal motor deficit, sensory intact to light touch bilaterally.  Skin: Warm, dry and intact. No rash, cyanosis, or clubbing.

## 2010-10-15 NOTE — Assessment & Plan Note (Addendum)
Patient was noted to have Moderate OSA/hypopnea eval. by psg in 10/2005 (Dr. Maple Hudson).Then another sleep study done in 04/2008. Patient never used a CPAP . April 2012 she was seen back in the clinic and was Home Health referral was placed for CPAP. Patient informed me that she was never contact. After calling the agency we were informed that they contacted her once and she never  Returned a call back therefore no CPAP was provided.  I think the palpitation, headache and the gasping for breath is related to OSA which may be worsen compared to 2007. Therefore I will refer the patient for a sleep study since it is too long ago. I informed her about the importance of the CPAP and she seems to be understanding.

## 2010-10-15 NOTE — Progress Notes (Deleted)
  Subjective:    Patient ID: Lydia Walsh, female    DOB: 1955/01/20, 55 y.o.   MRN: [id number]  HPI    Review of Systems     Objective:   Physical Exam        Assessment & Plan:

## 2010-10-15 NOTE — Assessment & Plan Note (Signed)
Patient noted that she has not been taking Allopurinol but she noted occasional pain in the joint. Will check uric acid today. Patient may need to be placed back on Allopurinol.

## 2010-10-15 NOTE — Assessment & Plan Note (Addendum)
BP not well controlled. Patient noted that she is did not take her medication today since she gets dizzy if she has to drive if she did take the medication. I am not sure what too make out of it. She also significant risk that the BP is not controlled due to untreated OSA. I informed the patient to check her blood pressure on a regular basis and especially when she is feeling dizzy. I will have her come back in 2 weeks to reevaluate her. She may benefit from a change in management. We can not use HCTZ due to history of gout.

## 2010-10-17 ENCOUNTER — Other Ambulatory Visit: Payer: Self-pay | Admitting: Internal Medicine

## 2010-10-17 LAB — BASIC METABOLIC PANEL
BUN: 10
Chloride: 99
Creatinine, Ser: 0.87
Glucose, Bld: 109 — ABNORMAL HIGH
Potassium: 3.7

## 2010-10-17 LAB — MAGNESIUM: Magnesium: 2.1 mg/dL (ref 1.5–2.5)

## 2010-10-21 NOTE — Progress Notes (Signed)
Addended by: Kingsley Spittle C on: 10/21/2010 09:00 AM   Modules accepted: Orders

## 2010-10-23 LAB — URINALYSIS, ROUTINE W REFLEX MICROSCOPIC
Bilirubin Urine: NEGATIVE
Glucose, UA: NEGATIVE
Hgb urine dipstick: NEGATIVE
Specific Gravity, Urine: 1.007

## 2010-10-23 LAB — URINE MICROSCOPIC-ADD ON

## 2010-10-23 LAB — URINE CULTURE: Colony Count: 50000

## 2010-10-29 ENCOUNTER — Encounter: Payer: BC Managed Care – PPO | Admitting: Internal Medicine

## 2010-11-03 ENCOUNTER — Encounter (HOSPITAL_BASED_OUTPATIENT_CLINIC_OR_DEPARTMENT_OTHER): Payer: BC Managed Care – PPO

## 2010-11-05 ENCOUNTER — Telehealth: Payer: Self-pay | Admitting: *Deleted

## 2010-11-05 NOTE — Telephone Encounter (Signed)
Prior authorization signed by Dr Loistine Chance - Pt is currently only on Nexium. Faxed to [phone]. Stanton Kidney Manasvini Whatley RN 11/05/10 1:15PM

## 2010-11-11 ENCOUNTER — Encounter: Payer: BC Managed Care – PPO | Admitting: Internal Medicine

## 2010-11-12 ENCOUNTER — Encounter: Payer: BC Managed Care – PPO | Admitting: Internal Medicine

## 2010-11-14 ENCOUNTER — Ambulatory Visit (HOSPITAL_BASED_OUTPATIENT_CLINIC_OR_DEPARTMENT_OTHER): Payer: BC Managed Care – PPO | Attending: Internal Medicine

## 2010-11-14 ENCOUNTER — Encounter: Payer: BC Managed Care – PPO | Admitting: Internal Medicine

## 2010-11-14 DIAGNOSIS — G4733 Obstructive sleep apnea (adult) (pediatric): Secondary | ICD-10-CM

## 2010-11-14 DIAGNOSIS — G4710 Hypersomnia, unspecified: Secondary | ICD-10-CM | POA: Insufficient documentation

## 2010-11-15 DIAGNOSIS — G4730 Sleep apnea, unspecified: Secondary | ICD-10-CM

## 2010-11-15 DIAGNOSIS — G4710 Hypersomnia, unspecified: Secondary | ICD-10-CM

## 2010-11-15 DIAGNOSIS — G4761 Periodic limb movement disorder: Secondary | ICD-10-CM

## 2010-11-17 NOTE — Procedures (Signed)
Lydia Walsh, Lydia Walsh                ACCOUNT NO.:  [id number]  MEDICAL RECORD NO.:  [id number]          PATIENT TYPE:  OUT  LOCATION:  SLEEP CENTER                 FACILITY:  [facility]  PHYSICIAN:  Clinton D. Maple Hudson, MD, FCCP, FACPDATE OF BIRTH:  09-30-1955  DATE OF STUDY:  11/15/2010                           NOCTURNAL POLYSOMNOGRAM  REFERRING PHYSICIAN:  Almyra Deforest, MD  REFERRING DOCTOR:  Almyra Deforest, MD  INDICATION FOR STUDY:  Hypersomnia with sleep apnea.  EPWORTH SLEEPINESS SCORE:  8/24.  BMI 38.1, weight 195 pounds, height 60 inches, neck 15 inches.  MEDICATIONS:  Home medications are charted and reviewed.  A baseline diagnostic NPSG on June 20, 2005, had recorded an AHI of 26.6 per hour.  Weight was 193 pounds.  The CPAP titration study on April 13, 2008, titrated her to CPAP 18 CWP, body weight was 193 pounds.  CPAP titration is now requested.  SLEEP ARCHITECTURE:  Total sleep time 344 minutes with sleep efficiency 83.6%.  Stage I was 7%, stage II 75.4%, stage III 4.5%, REM 13.1% of total sleep time.  Sleep latency 36.5 minutes, REM latency 239 minutes, awake after sleep onset 32.5 minutes.  Arousal index 28.4.  BEDTIME MEDICATION:  None.  RESPIRATORY DATA:  CPAP titration protocol.  CPAP was titrated to 9 CWP, AHI 0 per hour.  She wore a small ResMed Quattro FX fullface mask with heated humidifier.  OXYGEN DATA:  Snoring was prevented by CPAP and mean oxygen saturation held 94.8% on room air.  CARDIAC DATA:  Normal sinus rhythm.  MOVEMENT-PARASOMNIA:  A total of 36 limb jerks were counted, of which 13 were associated with arousal or awakening for periodic limb movement with arousal index of 2.3 per hour.  This was during the CPAP titration study.  No bathroom trips.  IMPRESSIONS-RECOMMENDATIONS: 1. Successful continuous positive airway pressure titration to 9 CWP,     AHI 0 per hour.  She wore a small ResMed Quattro FX fullface mask     with heated humidifier.   Snoring was prevented and mean oxygen     saturation held 94.8% on room air. 2. An original diagnostic NPSG on July 10, 2005 recorded an AHI of     26.6 per hour.  Body weight was 193 pounds.  A continuous positive     airway pressure titration study on April 13, 2008, titrated to 18     CWP, body weight was 193 pounds. 3. She commented that she was able to sleep much better at the Sleep     Center.  She states that she works from 3:30 p.m. until midnight     and then sleeps from 1 a.m. until 5 a.m.  On days off, she sleeps     from 10 p.m., waking up at 2 a.m.  This suggests significant     disturbance of sleep schedule may contribute to sleep complaint in     the home environment.     Clinton D. Maple Hudson, MD, [place], FACP Diplomate, Biomedical engineer of Sleep Medicine Electronically Signed    CDY/MEDQ  D:  11/15/2010 10:32:27  T:  11/15/2010 23:23:58  Job:  [id number]

## 2010-11-19 ENCOUNTER — Encounter: Payer: BC Managed Care – PPO | Admitting: Internal Medicine

## 2010-12-01 ENCOUNTER — Telehealth: Payer: Self-pay | Admitting: *Deleted

## 2010-12-01 NOTE — Telephone Encounter (Signed)
Called [phone] - denied Prevacid - approved Omeprazole and Nexium till 11/25/11. Would be approved if pt was intolence to all meds of that classifacation. Flag sent to Dr Loistine Chance. Left message on pt phone ID recording. Stanton Kidney Arnecia Ector RN 12/01/10 9:30AM

## 2011-03-05 ENCOUNTER — Encounter (HOSPITAL_COMMUNITY): Payer: Self-pay | Admitting: Emergency Medicine

## 2011-03-05 ENCOUNTER — Emergency Department (HOSPITAL_COMMUNITY)
Admission: EM | Admit: 2011-03-05 | Discharge: 2011-03-05 | Disposition: A | Discharge status: Home / Self Care | Payer: BC Managed Care – PPO | Source: Home / Self Care | Attending: Family Medicine | Admitting: Family Medicine

## 2011-03-05 DIAGNOSIS — B86 Scabies: Secondary | ICD-10-CM

## 2011-03-05 MED ORDER — PERMETHRIN 5 % EX CREA: TOPICAL_CREAM | CUTANEOUS | Status: AC

## 2011-03-05 NOTE — ED Notes (Signed)
Patient has red bumps on thighs, back.  Onset 1 1/2 weeks ago.  Reports someone else did laundry, not sure if this is related.  Rash itches very badly.  Denies any recent uri, or fever, or unusual aches.

## 2011-03-05 NOTE — ED Notes (Signed)
Called [facility] pharmacy [phone].  Notified [facility] staff of script for patient

## 2011-03-05 NOTE — ED Provider Notes (Signed)
History     CSN: [id number]  Arrival date & time 03/05/11  0818   First MD Initiated Contact with Patient 03/05/11 [phone]      Chief Complaint  Patient presents with  . Rash    (Consider location/radiation/quality/duration/timing/severity/associated sxs/prior treatment) HPI Comments: Patient reports having an itchy rash on hand,arm, abd,back, and thighs x 1.5 wks. Denies other contacts with rash. Thought it possibly form a different detergent. No other symptoms.    The history is provided by the patient.    Past Medical History  Diagnosis Date  . Hyperlipidemia   . Hypertension   . Thyroid disease     hypothyroidism, well controlled  . Anemia     iron deficiency anemia  . Osteoarthritis   . Sleep apnea     Past Surgical History  Procedure Date  . Myomectomy     History reviewed. No pertinent family history.  History  Substance Use Topics  . Smoking status: Former Games developer  . Smokeless tobacco: Not on file   Comment: suit x 53yrs.  . Alcohol Use: No    OB History    Grav Para Term Preterm Abortions TAB SAB Ect Mult Living                  Review of Systems  Constitutional: Negative.   HENT: Negative.   Respiratory: Negative.   Cardiovascular: Negative.   Genitourinary: Negative.   Musculoskeletal: Negative.     Allergies  Ace inhibitors  Home Medications   Current Outpatient Rx  Name Route Sig Dispense Refill  . AMLODIPINE BESYLATE 10 MG PO TABS Oral Take 1 tablet (10 mg total) by mouth daily. 30 tablet 11  . ATENOLOL 100 MG PO TABS Oral Take 1 tablet (100 mg total) by mouth daily. 30 tablet 11  . DICLOFENAC SODIUM 1 % TD GEL Topical Apply 1 application topically 3 (three) times daily. Over your knees 1 Tube 2  . ESOMEPRAZOLE MAGNESIUM 20 MG PO CPDR Oral Take 2 capsules (40 mg total) by mouth daily before breakfast. 30 capsule 3  . FUROSEMIDE 40 MG PO TABS Oral Take 1 tablet (40 mg total) by mouth daily. 30 tablet 11  . HYDROCODONE-ACETAMINOPHEN  5-500 MG PO TABS        Dispenced by Dr. Luiz Blare.   Marland Kitchen KLOR-CON M20 20 MEQ PO TBCR      . LEVOTHYROXINE SODIUM 100 MCG PO TABS Oral Take 1 tablet (100 mcg total) by mouth daily. 30 tablet 6  . PERMETHRIN 5 % EX CREA  Apply to affected area once 60 g 0  . POTASSIUM CHLORIDE 20 MEQ PO PACK Oral Take 20 mEq by mouth daily. 30 packet 3  . ROSUVASTATIN CALCIUM 20 MG PO TABS Oral Take 1 tablet (20 mg total) by mouth daily. 30 tablet 11    BP 134/80  Pulse 66  Temp(Src) 98 F (36.7 C) (Oral)  Resp 24  SpO2 99%  Physical Exam  Nursing note and vitals reviewed. Constitutional: She appears well-developed and well-nourished. No distress.  HENT:  Head: Normocephalic.  Mouth/Throat: Oropharynx is clear and moist. No oropharyngeal exudate.  Neck: Normal range of motion. Neck supple. No thyromegaly present.  Cardiovascular: Normal rate and regular rhythm.   Pulmonary/Chest: Effort normal and breath sounds normal.  Lymphadenopathy:    She has no cervical adenopathy.  Skin: Skin is warm and dry. Rash noted.       Red, discrete, raised, on hands , web spaced, forearms, abd, back  and proximal thighs    ED Course  Procedures (including critical care time)  Labs Reviewed - No data to display No results found.   1. Scabies       MDM          Randa Spike, MD 03/05/11 [phone]

## 2011-03-05 NOTE — Discharge Instructions (Signed)
Apply cream as directed. Wash bedding and any worn clothing as directedScabies Scabies are small bugs (mites) that burrow under the skin and cause red bumps and severe itching. These bugs can only be seen with a microscope. Scabies are highly contagious. They can spread easily from person to person by direct contact. They are also spread through sharing clothing or linens that have the scabies mites living in them. It is not unusual for an entire family to become infected through shared towels, clothing, or bedding.  HOME CARE INSTRUCTIONS   Your caregiver may prescribe a cream or lotion to kill the mites. If this cream is prescribed; massage the cream into the entire area of the body from the neck to the bottom of both feet. Also massage the cream into the scalp and face if your child is less than 67 year old. Avoid the eyes and mouth.   Leave the cream on for 8 to12 hours. Do not wash your hands after application. Your child should bathe or shower after the 8 to 12 hour application period. Sometimes it is helpful to apply the cream to your child at right before bedtime.   One treatment is usually effective and will eliminate approximately 95% of infestations. For severe cases, your caregiver may decide to repeat the treatment in 1 week. Everyone in your household should be treated with one application of the cream.   New rashes or burrows should not appear after successful treatment within 24 to 48 hours; however the itching and rash may last for 2 to 4 weeks after successful treatment. If your symptoms persist longer than this, see your caregiver.   Your caregiver also may prescribe a medication to help with the itching or to help the rash go away more quickly.   Scabies can live on clothing or linens for up to 3 days. Your entire child's recently used clothing, towels, stuffed toys, and bed linens should be washed in hot water and then dried in a dryer for at least 20 minutes on high heat. Items  that cannot be washed should be enclosed in a plastic bag for at least 3 days.   To help relieve itching, bathe your child in a cool bath or apply cool washcloths to the affected areas.   Your child may return to school after treatment with the prescribed cream.  SEEK MEDICAL CARE IF:   The itching persists longer than 4 weeks after treatment.   The rash spreads or becomes infected (the area has red blisters or yellow-tan crust).  Document Released: 12/29/2004 Document Revised: 09/10/2010 Document Reviewed: 05/09/2008 [facility] Patient Information 2012 [place], [place].

## 2011-03-18 ENCOUNTER — Other Ambulatory Visit: Payer: Self-pay | Admitting: Internal Medicine

## 2011-03-19 NOTE — Telephone Encounter (Signed)
Patient has 2 forms of potassium chloride on her med list; please contact her and find out which she is taking.

## 2011-03-20 ENCOUNTER — Other Ambulatory Visit: Payer: Self-pay | Admitting: *Deleted

## 2011-03-20 DIAGNOSIS — I10 Essential (primary) hypertension: Secondary | ICD-10-CM

## 2011-03-20 MED ORDER — POTASSIUM CHLORIDE 20 MEQ PO PACK: PACK | ORAL | Status: DC

## 2011-03-20 NOTE — Telephone Encounter (Signed)
Please schedule a follow-up appointment with patient's PCP. 

## 2011-03-20 NOTE — Telephone Encounter (Signed)
Talked with pt - prefers only to take potassium packet and not Klor-con tab. Needs Rx refill on potassium packet [facility].

## 2011-03-20 NOTE — Telephone Encounter (Signed)
Talked with pt - pt prefers to use potassium packets and [facility] has packets. Pt prefers not to take tablet in potassium.

## 2011-03-24 NOTE — Telephone Encounter (Signed)
Left message on home ID phone recording - needs FU with PCP- to call clinic and sch appt per Dr Meredith Pel.

## 2011-03-31 ENCOUNTER — Emergency Department (HOSPITAL_COMMUNITY)
Admission: EM | Admit: 2011-03-31 | Discharge: 2011-04-01 | Disposition: A | Discharge status: Home / Self Care | Payer: BC Managed Care – PPO | Attending: Emergency Medicine | Admitting: Emergency Medicine

## 2011-03-31 ENCOUNTER — Encounter (HOSPITAL_COMMUNITY): Payer: Self-pay | Admitting: *Deleted

## 2011-03-31 DIAGNOSIS — T7840XA Allergy, unspecified, initial encounter: Secondary | ICD-10-CM | POA: Insufficient documentation

## 2011-03-31 DIAGNOSIS — E039 Hypothyroidism, unspecified: Secondary | ICD-10-CM | POA: Insufficient documentation

## 2011-03-31 DIAGNOSIS — M1990 Unspecified osteoarthritis, unspecified site: Secondary | ICD-10-CM | POA: Insufficient documentation

## 2011-03-31 DIAGNOSIS — Z79899 Other long term (current) drug therapy: Secondary | ICD-10-CM | POA: Insufficient documentation

## 2011-03-31 DIAGNOSIS — R220 Localized swelling, mass and lump, head: Secondary | ICD-10-CM | POA: Insufficient documentation

## 2011-03-31 DIAGNOSIS — E785 Hyperlipidemia, unspecified: Secondary | ICD-10-CM | POA: Insufficient documentation

## 2011-03-31 DIAGNOSIS — I10 Essential (primary) hypertension: Secondary | ICD-10-CM | POA: Insufficient documentation

## 2011-03-31 MED ORDER — METHYLPREDNISOLONE SODIUM SUCC 125 MG IJ SOLR
125.0000 mg | Freq: Once | INTRAMUSCULAR | Status: AC
  Administered 2011-03-31: 125 mg via INTRAVENOUS
  Filled 2011-03-31: qty 2

## 2011-03-31 NOTE — ED Provider Notes (Signed)
History     CSN: [id number]  Arrival date & time 03/31/11  2217   First MD Initiated Contact with Patient 03/31/11 2224      Chief Complaint  Patient presents with  . Allergic Reaction    (Consider location/radiation/quality/duration/timing/severity/associated sxs/prior treatment) The history is provided by the patient and the EMS personnel.   patient reports she ate a week crack her and developed significant swelling of her tongue.  She is not on lisinopril.  EMS was called and the patient received Benadryl and Zantac in route.  Patient and the EMS reports significant improvement in her tongue swelling by the time they arrived the ER.  This time the patient reports mild swelling of her tongue but reports that significantly improved.  She has no difficulty breathing or swallowing.  She has no history of asthma or eczema.  She's never had allergic reactions before in the past.  She reports she feels good at this time.  Nothing worsens her symptoms.  Her symptoms have been improved thus far with Zantac and Benadryl.  She did not receive steroids in route.  Her vital signs been stable  Past Medical History  Diagnosis Date  . Hyperlipidemia   . Hypertension   . Thyroid disease     hypothyroidism, well controlled  . Anemia     iron deficiency anemia  . Osteoarthritis   . Sleep apnea     Past Surgical History  Procedure Date  . Myomectomy     History reviewed. No pertinent family history.  History  Substance Use Topics  . Smoking status: Former Games developer  . Smokeless tobacco: Not on file   Comment: suit x 55yrs.  . Alcohol Use: No    OB History    Grav Para Term Preterm Abortions TAB SAB Ect Mult Living                  Review of Systems  All other systems reviewed and are negative.    Allergies  Ace inhibitors  Home Medications   Current Outpatient Rx  Name Route Sig Dispense Refill  . AMLODIPINE BESYLATE 10 MG PO TABS Oral Take 1 tablet (10 mg total) by mouth  daily. 30 tablet 11  . ATENOLOL 100 MG PO TABS Oral Take 1 tablet (100 mg total) by mouth daily. 30 tablet 11  . DICLOFENAC SODIUM 1 % TD GEL Topical Apply 1 application topically 3 (three) times daily. Over your knees 1 Tube 2  . ESOMEPRAZOLE MAGNESIUM 20 MG PO CPDR Oral Take 2 capsules (40 mg total) by mouth daily before breakfast. 30 capsule 3  . FUROSEMIDE 40 MG PO TABS Oral Take 1 tablet (40 mg total) by mouth daily. 30 tablet 11  . HYDROCODONE-ACETAMINOPHEN 5-500 MG PO TABS        Dispenced by Dr. Luiz Blare.   Marland Kitchen KLOR-CON M20 20 MEQ PO TBCR      . LEVOTHYROXINE SODIUM 100 MCG PO TABS Oral Take 1 tablet (100 mcg total) by mouth daily. 30 tablet 6  . POTASSIUM CHLORIDE 20 MEQ PO PACK  Dissolve one packet in 4 ounces of water or other beverage and take once a day. 30 packet 0  . ROSUVASTATIN CALCIUM 20 MG PO TABS Oral Take 1 tablet (20 mg total) by mouth daily. 30 tablet 11    BP 169/75  Pulse 97  Temp(Src) 97.7 F (36.5 C) (Oral)  Resp 20  SpO2 99%  Physical Exam  Nursing note and vitals reviewed. Constitutional:  She is oriented to person, place, and time. She appears well-developed and well-nourished. No distress.  HENT:  Head: Normocephalic and atraumatic.       Mild swelling of tongue.  Her airway is patent.  She's tolerating secretions.  She's speaking in full sentences.  She has no swelling of her labs.  His no swelling of her anterior neck.  Eyes: EOM are normal.  Neck: Normal range of motion.  Cardiovascular: Normal rate, regular rhythm and normal heart sounds.   Pulmonary/Chest: Effort normal and breath sounds normal.  Abdominal: Soft. She exhibits no distension. There is no tenderness.  Musculoskeletal: Normal range of motion.  Neurological: She is alert and oriented to person, place, and time.  Skin: Skin is warm and dry.  Psychiatric: She has a normal mood and affect. Judgment normal.    ED Course  Procedures (including critical care time)  Labs Reviewed - No data  to display No results found.   1. Allergic reaction       MDM  Both patient arrived the ER her tongue was artery significantly reduced per EMS.  She was monitored in the ER for approximately 2 hours.  She's continued to have significant improvement and reports no swelling of her tongue at this time.  She's given a dose of steroids in the ER.  She is sent home on steroids Pepcid and Benadryl.  She understands to return to the ER for new or worsening symptoms        Lyanne Co, MD 04/01/11 [phone]

## 2011-03-31 NOTE — ED Notes (Signed)
ZOX:WRUE<AV> Expected date:<BR> Expected time:10:22 PM<BR> Means of arrival:Ambulance<BR> Comments:<BR> M50 -- Allergic Reaction

## 2011-03-31 NOTE — ED Notes (Signed)
Patient arrives by EMS from work with c/o enlarge tongue after ingesting a piece of a wheat cracker approx. 30-45 minutes ago.  Patient denies ShOB; maintaining secretions; respirations even and unlabored on room air. Ambulating to restroom with minimal assistance. Denies pain.

## 2011-03-31 NOTE — ED Notes (Signed)
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 Patient 97% on RA. Given Benadryl 50mg  IVP & Zantac 50mg  IVP en route. Arrives respirations even and unlabored. Per EMS, tongue is less swollen than arrival to scene. Denies ShOB at this time.

## 2011-03-31 NOTE — ED Notes (Signed)
Dr. Patria Mane to bedside

## 2011-04-01 MED ORDER — PREDNISONE 10 MG PO TABS: 60.0000 mg | ORAL_TABLET | Freq: Every day | ORAL | Status: DC

## 2011-04-01 MED ORDER — DIPHENHYDRAMINE HCL 25 MG PO CAPS: 25.0000 mg | ORAL_CAPSULE | Freq: Four times a day (QID) | ORAL | Status: DC

## 2011-04-01 MED ORDER — FAMOTIDINE 20 MG PO TABS: 20.0000 mg | ORAL_TABLET | Freq: Two times a day (BID) | ORAL | Status: DC

## 2011-04-01 NOTE — Discharge Instructions (Signed)

## 2011-04-08 ENCOUNTER — Encounter (HOSPITAL_COMMUNITY): Payer: Self-pay | Admitting: *Deleted

## 2011-04-08 ENCOUNTER — Encounter: Payer: Self-pay | Admitting: *Deleted

## 2011-04-08 ENCOUNTER — Ambulatory Visit (INDEPENDENT_AMBULATORY_CARE_PROVIDER_SITE_OTHER): Payer: BC Managed Care – PPO | Admitting: Internal Medicine

## 2011-04-08 ENCOUNTER — Emergency Department (HOSPITAL_COMMUNITY)
Admission: EM | Admit: 2011-04-08 | Discharge: 2011-04-08 | Disposition: A | Discharge status: Home / Self Care | Payer: BC Managed Care – PPO | Attending: Emergency Medicine | Admitting: Emergency Medicine

## 2011-04-08 ENCOUNTER — Encounter: Payer: Self-pay | Admitting: Internal Medicine

## 2011-04-08 VITALS — BP 132/75 | HR 75 | Temp 97.5°F | Ht 60.0 in | Wt 195.6 lb

## 2011-04-08 DIAGNOSIS — Z87891 Personal history of nicotine dependence: Secondary | ICD-10-CM | POA: Insufficient documentation

## 2011-04-08 DIAGNOSIS — E039 Hypothyroidism, unspecified: Secondary | ICD-10-CM | POA: Insufficient documentation

## 2011-04-08 DIAGNOSIS — T7840XA Allergy, unspecified, initial encounter: Secondary | ICD-10-CM

## 2011-04-08 DIAGNOSIS — I10 Essential (primary) hypertension: Secondary | ICD-10-CM

## 2011-04-08 DIAGNOSIS — R609 Edema, unspecified: Secondary | ICD-10-CM

## 2011-04-08 DIAGNOSIS — G4730 Sleep apnea, unspecified: Secondary | ICD-10-CM | POA: Insufficient documentation

## 2011-04-08 DIAGNOSIS — D509 Iron deficiency anemia, unspecified: Secondary | ICD-10-CM | POA: Insufficient documentation

## 2011-04-08 DIAGNOSIS — E785 Hyperlipidemia, unspecified: Secondary | ICD-10-CM | POA: Insufficient documentation

## 2011-04-08 DIAGNOSIS — M1990 Unspecified osteoarthritis, unspecified site: Secondary | ICD-10-CM | POA: Insufficient documentation

## 2011-04-08 DIAGNOSIS — T781XXA Other adverse food reactions, not elsewhere classified, initial encounter: Secondary | ICD-10-CM | POA: Insufficient documentation

## 2011-04-08 MED ORDER — FAMOTIDINE 20 MG PO TABS: 20.0000 mg | ORAL_TABLET | Freq: Two times a day (BID) | ORAL | Status: DC

## 2011-04-08 MED ORDER — PREDNISONE 20 MG PO TABS
60.0000 mg | ORAL_TABLET | Freq: Once | ORAL | Status: AC
  Administered 2011-04-08: 60 mg via ORAL
  Filled 2011-04-08: qty 3

## 2011-04-08 MED ORDER — PREDNISONE 10 MG PO TABS: ORAL_TABLET | ORAL | Status: DC

## 2011-04-08 MED ORDER — DIPHENHYDRAMINE HCL 25 MG PO TABS: 50.0000 mg | ORAL_TABLET | ORAL | Status: DC | PRN

## 2011-04-08 MED ORDER — DIPHENHYDRAMINE HCL 25 MG PO CAPS
25.0000 mg | ORAL_CAPSULE | Freq: Once | ORAL | Status: AC
  Administered 2011-04-08: 25 mg via ORAL
  Filled 2011-04-08: qty 1

## 2011-04-08 MED ORDER — METHYLPREDNISOLONE SODIUM SUCC 40 MG IJ SOLR
40.0000 mg | Freq: Once | INTRAMUSCULAR | Status: AC
  Administered 2011-04-08: 40 mg via INTRAMUSCULAR

## 2011-04-08 NOTE — ED Notes (Addendum)
Here for facial swelling, "not getting better", has been seen for same on 3/19 & today in [facility] clinic, r/t "crackers that he ate", was given 3 Rx on 3/19 that she did not fill or take. Seen in [facility] today and given injection, "was waiting to take the benadryl tonight". Did not understand medications. meds explained. Wanting meds called into pharmacy. Lip swelling, jaw and face swelling noted.  Alert, NAD, calm, interactive, LS CTA, throat unremarkable. Has Rx for prednisone, pepcid & benadryl in hand.

## 2011-04-08 NOTE — Progress Notes (Unsigned)
Pt presents c/o allergic reaction, her top L lip is very swollen, denies resp distress, none noted, no other complaints. She states she ate multigrain crackers on 3/19 at work and her tongue began to swell, EMS was notified and she was taken to ED, diag allergic reaction, she was given a script to be filled but did not have the money to have it filled. Today she was having lunch, had spaghetti and a cracker, afterwards her top lip began to swell. She is scheduled w/ dr Anselm Jungling at 1515 today and returns to lobby to wait, she is agreeable with this.

## 2011-04-08 NOTE — ED Provider Notes (Signed)
History     CSN: [id number]  Arrival date & time 04/08/11  1925   First MD Initiated Contact with Patient 04/08/11 2051      Chief Complaint  Patient presents with  . Facial Swelling     HPI  History provided by the patient and family. Patient is 56 yo female who presents with concerns for persistent right facial swelling that began earlier today. Patient states the she has had similar episodes of swelling to the face and lips but is unsure of the cause. Patient was seen and initially for these kinds of symptoms on March 19. Patient states that she was eating some crackers at that time. She was given prescriptions for medications to help with allergic reaction but she never filled the prescriptions. Today patient was eating crackers again and had similar reaction of swelling to the lip right face. Patient went to urgent care clinic and was given a shot in arm. Patient states swelling of lips and seemed improved but she continues to have swelling of the cheek. Patient presents with concerns for the swelling is not getting better. She denies any swallowing or breathing difficulty. She denies any rash of the skin or pruritus. She denies any other aggravating or alleviating factors.    Past Medical History  Diagnosis Date  . Hyperlipidemia   . Hypertension   . Thyroid disease     hypothyroidism, well controlled  . Anemia     iron deficiency anemia  . Osteoarthritis   . Sleep apnea     Past Surgical History  Procedure Date  . Myomectomy     No family history on file.  History  Substance Use Topics  . Smoking status: Former Games developer  . Smokeless tobacco: Not on file   Comment: suit x 41yrs.  . Alcohol Use: No    OB History    Grav Para Term Preterm Abortions TAB SAB Ect Mult Living                  Review of Systems  HENT: Negative for congestion, sore throat, rhinorrhea, drooling and trouble swallowing.   Eyes: Negative for itching.  Respiratory: Negative for cough and  shortness of breath.   Gastrointestinal: Negative for nausea and vomiting.  Skin: Negative for rash.    Allergies  Ace inhibitors  Home Medications   Current Outpatient Rx  Name Route Sig Dispense Refill  . AMLODIPINE BESYLATE 10 MG PO TABS Oral Take 1 tablet (10 mg total) by mouth daily. 30 tablet 11  . ATENOLOL 100 MG PO TABS Oral Take 1 tablet (100 mg total) by mouth daily. 30 tablet 11  . FUROSEMIDE 40 MG PO TABS Oral Take 1 tablet (40 mg total) by mouth daily. 30 tablet 11  . LEVOTHYROXINE SODIUM 100 MCG PO TABS Oral Take 1 tablet (100 mcg total) by mouth daily. 30 tablet 6  . POTASSIUM CHLORIDE 20 MEQ PO PACK  Dissolve one packet in 4 ounces of water or other beverage and take once a day. 30 packet 0  . ROSUVASTATIN CALCIUM 20 MG PO TABS Oral Take 1 tablet (20 mg total) by mouth daily. 30 tablet 11    BP 186/90  Temp(Src) 98.3 F (36.8 C) (Oral)  Resp 20  SpO2 97%  Physical Exam  Nursing note and vitals reviewed. Constitutional: She is oriented to person, place, and time. She appears well-developed and well-nourished. No distress.  HENT:  Head: Normocephalic and atraumatic.  Mouth/Throat: Oropharynx is clear and  moist.       Mild swelling to right cheek and normal mucosa. No nodule or mass. Normal gums, lips and tongue. Normal oropharynx.  Cardiovascular: Normal rate and regular rhythm.   Pulmonary/Chest: Effort normal and breath sounds normal. No stridor. No respiratory distress. She has no wheezes. She has no rales.  Abdominal: Soft.  Neurological: She is alert and oriented to person, place, and time.  Skin: Skin is warm and dry. No rash noted.  Psychiatric: She has a normal mood and affect. Her behavior is normal.    ED Course  Procedures      1. Allergic reaction       MDM  9:38 PM patient seen and evaluated. Patient no acute distress. Patient with normal respirations. No stridor.        Angus Seller, [place] 04/09/11 [phone]

## 2011-04-08 NOTE — Progress Notes (Signed)
History of present illness: Lydia Walsh is a 56 year old woman with past medical history of hypertension, hyperlipidemia, hypothyroidism anemia, osteoarthritis, gout, GERD, chronic low back pain presents today for hospital followup. She was recently seen in the emergency room for allergic reaction after eating wheat cracker.  She was sent home with 5 day course of steroids, Benadryl and Pepcid; however, she has not filled her medications because she will not get paid until tomorrow.  She states that her swelling decreased but then about 2 hours, she ate a different kind of cracker which made her lip swell.  She denies any difficulty breathing today, SOB, or chest pain.  Review of system: As per history of present illness  Physical examination: General: alert, well-developed, and cooperative to examination.  Mouth:upper lip swelling but no oral/pharyngeal/tongue swelling/erythema, airway patent.  Can speak in complete sentences. Lungs: normal respiratory effort, no accessory muscle use, normal breath sounds, no crackles, and no wheezes. Heart: normal rate, regular rhythm, +2/6 systolic murmur, no gallop, and no rub.  Abdomen: soft, non-tender, normal bowel sounds, no distention, no guarding, no rebound tenderness Neurologic: alert & oriented X3, cranial nerves II-XII intact, strength normal in all extremities, sensation intact to light touch, and gait normal.

## 2011-04-08 NOTE — Patient Instructions (Signed)
Please take the Prednisone taper course as prescribed Take Benadryl as prescribed Take Pepcid as prescribed Will make a referral to Allergist for further testing Follow up with your primary care physician as needed

## 2011-04-08 NOTE — Discharge Instructions (Signed)
Please followup with the primary care provider. Return to emergency room if you have any worsening swelling, difficulty swallowing or difficulty breathing.   Allergic Reaction, Mild to Moderate Allergies may happen from anything your body is sensitive to. This may be food, medications, pollens, chemicals, and nearly anything around you in everyday life that produces allergens. An allergen is anything that causes an allergy producing substance. Allergens cause your body to release allergic antibodies. Through a chain of events, they cause a release of histamine into the blood stream. Histamines are meant to protect you, but they also cause your discomfort. This is why antihistamines are often used for allergies. Heredity is often a factor in causing allergic reactions. This means you may have some of the same allergies as your parents. Allergies happen in all age groups. You may have some idea of what caused your reaction. There are many allergens around [place]. It may be difficult to know what caused your reaction. If this is a first time event, it may never happen again. Allergies cannot be cured but can be controlled with medications. SYMPTOMS  You may get some or all of the following problems from allergies.  Swelling and itching in and around the mouth.   Tearing, itchy eyes.   Nasal congestion and runny nose.   Sneezing and coughing.   An itchy red rash or hives.   Vomiting or diarrhea.   Difficulty breathing.  Seasonal allergies occur in all age groups. They are seasonal because they usually occur during the same season every year. They may be a reaction to molds, grass pollens, or tree pollens. Other causes of allergies are house dust mite allergens, pet dander and mold spores. These are just a common few of the thousands of allergens around [place]. All of the symptoms listed above happen when you come in contact with pollens and other allergens. Seasonal allergies are usually not life  threatening. They are generally more of a nuisance that can often be handled using medications. Hay fever is a combination of all or some of the above listed allergy problems. It may often be treated with simple over-the-counter medications such as diphenhydramine. Take medication as directed. Check with your caregiver or package insert for child dosages. TREATMENT AND HOME CARE INSTRUCTIONS If hives or rash are present:  Take medications as directed.   You may use an over-the-counter antihistamine (diphenhydramine) for hives and itching as needed. Do not drive or drink alcohol until medications used to treat the reaction have worn off. Antihistamines tend to make people sleepy.   Apply cold cloths (compresses) to the skin or take baths in cool water. This will help itching. Avoid hot baths or showers. Heat will make a rash and itching worse.   If your allergies persist and become more severe, and over the counter medications are not effective, there are many new medications your caretaker can prescribe. Immunotherapy or desensitizing injections can be used if all else fails. Follow up with your caregiver if problems continue.  SEEK MEDICAL CARE IF:   Your allergies are becoming progressively more troublesome.   You suspect a food allergy. Symptoms generally happen within 30 minutes of eating a food.   Your symptoms have not gone away within 2 days or are getting worse.   You develop new symptoms.   You want to retest yourself or your child with a food or drink you think causes an allergic reaction. Never test yourself or your child of a suspected allergy without being  under the watchful eye of your caregivers. A second exposure to an allergen may be life-threatening.  SEEK IMMEDIATE MEDICAL CARE IF:  You develop difficulty breathing or wheezing, or have a tight feeling in your chest or throat.   You develop a swollen mouth, hives, swelling, or itching all over your body.  A severe  reaction with any of the above problems should be considered life-threatening. If you suddenly develop difficulty breathing call for local emergency medical help. THIS IS AN EMERGENCY. MAKE SURE YOU:   Understand these instructions.   Will watch your condition.   Will get help right away if you are not doing well or get worse.  Document Released: 10/26/2006 Document Revised: 12/18/2010 Document Reviewed: 10/26/2006 [facility] Patient Information 2012 [place], [place].   RESOURCE GUIDE  Dental Problems  Patients with Medicaid: [facility] [phone]                                           [phone] [address] [employer] Phone:  [phone]                                                  Phone:  [phone]  If unable to pay or uninsured, contact:  [facility] or [facility]. to become qualified for the adult dental clinic.  Chronic Pain Problems Contact [facility]  [phone] Patients need to be referred by their primary care doctor.  Insufficient Money for Medicine Contact [employer] Way:  call "211" or [facility] 919-640-2865.  No Primary Care Doctor Call [facility]  531-812-1883 Other agencies that provide inexpensive medical care    [facility]  [phone]    [facility]  [phone]    [facility]  708-734-6068    [facility]  [phone]    [facility]anned Parenthood  3056937388    [facility]  [phone]  Psychological Services [facility]  [phone] [facility] Services  [phone] [facility]   7738826204 (emergency services 619-039-2863)  Substance Abuse Resources [facility]  ([phone] [facility] 604-732-2647 [facility] [place] [phone] Floydene Flock ([phone] Residential & Outpatient Substance Abuse Program  678-533-9083  Abuse/Neglect [facility] Child Abuse Hotline 726-316-3517 [facility] Child Abuse Hotline 929-265-7437 (After Hours)  Emergency [facility]ter [facility] (407) [phone]  Maternity Homes Room at the [place] [facility] [place] 607 003 4351 [place] Alert Services ([phone]  MRSA Hotline #:   507 408 6681    [facility] Resources  [facility] [place]     United [employer]                          [facility] Dept. [address] [place]                       [address]      [address]  [address]  [place]ela Hua Phone:  [phone]                                   Phone:  [phone]                 Phone:  [place] Phone:  [place] [phone] (After Hours)

## 2011-04-08 NOTE — Assessment & Plan Note (Addendum)
Allergic rx to wheat crackers or some kind of ingredients in crackers.  This is the second time she gets allergic reaction to crackers since 04/01/11.  Last time she went to the ED for swollen tongue and lip and throat and was sent home with steroid, pepcid, and benadryl.  She states that the swelling resolved until she ate another cracker today about 2 hours ago.  On exam, upper lip swelling but no tongue, oral pharynx swellling or erythema.  Airway patent and can speak in complete sentences. She states that she will be able to get her Rx  medications tomorrow. -Gave 1 dose of Solumedrol 40mg  IM in the office.  Gave 1 tablet of Benadryl 25mg  so she can take it once she gets home since she cannot get her meds filled until tomorrow. -Will treat with Benadryl & Pepcid -Steroid 60mg  po qd x 5days -Will refer to allergist for further allergy testing -Patient was instructed to go to the ED should she experience SOB or increase swelling

## 2011-04-08 NOTE — Assessment & Plan Note (Signed)
Well controlled, will continue current regimen.

## 2011-04-14 ENCOUNTER — Other Ambulatory Visit: Payer: Self-pay | Admitting: *Deleted

## 2011-04-14 DIAGNOSIS — K219 Gastro-esophageal reflux disease without esophagitis: Secondary | ICD-10-CM

## 2011-04-14 MED ORDER — FAMOTIDINE 20 MG PO TABS: 20.0000 mg | ORAL_TABLET | Freq: Two times a day (BID) | ORAL | Status: DC

## 2011-04-15 NOTE — ED Provider Notes (Signed)
Medical screening examination/treatment/procedure(s) were performed by non-physician practitioner and as supervising physician I was immediately available for consultation/collaboration.  Raeford Razor, MD 04/15/11 2245

## 2011-05-11 ENCOUNTER — Other Ambulatory Visit: Payer: Self-pay | Admitting: Ophthalmology

## 2011-05-11 DIAGNOSIS — E785 Hyperlipidemia, unspecified: Secondary | ICD-10-CM

## 2011-05-11 DIAGNOSIS — I10 Essential (primary) hypertension: Secondary | ICD-10-CM

## 2011-05-11 NOTE — Telephone Encounter (Signed)
Refill request from [facility] for Potassium Chloride ER tab (not packet) - Dissolve 1 tab in 4 oz of water or other beverage and take Once a day   Qty#30   Last filled - 03/20/11.

## 2011-05-12 ENCOUNTER — Other Ambulatory Visit: Payer: Self-pay | Admitting: *Deleted

## 2011-05-12 DIAGNOSIS — I10 Essential (primary) hypertension: Secondary | ICD-10-CM

## 2011-05-12 MED ORDER — POTASSIUM CHLORIDE 20 MEQ PO PACK: PACK | ORAL | Status: DC

## 2011-05-12 MED ORDER — LEVOTHYROXINE SODIUM 100 MCG PO TABS: 100.0000 ug | ORAL_TABLET | Freq: Every day | ORAL | Status: DC

## 2011-05-12 NOTE — Telephone Encounter (Signed)
Pt is out of meds, will you refill?

## 2011-05-21 ENCOUNTER — Other Ambulatory Visit: Payer: Self-pay | Admitting: *Deleted

## 2011-05-21 DIAGNOSIS — K219 Gastro-esophageal reflux disease without esophagitis: Secondary | ICD-10-CM

## 2011-05-21 MED ORDER — FAMOTIDINE 20 MG PO TABS: 20.0000 mg | ORAL_TABLET | Freq: Two times a day (BID) | ORAL | Status: DC

## 2011-06-01 ENCOUNTER — Encounter: Payer: Self-pay | Admitting: Internal Medicine

## 2011-06-03 ENCOUNTER — Other Ambulatory Visit: Payer: Self-pay | Admitting: Ophthalmology

## 2011-06-03 DIAGNOSIS — I10 Essential (primary) hypertension: Secondary | ICD-10-CM

## 2011-06-22 ENCOUNTER — Encounter: Payer: BC Managed Care – PPO | Admitting: Internal Medicine

## 2011-07-20 ENCOUNTER — Other Ambulatory Visit: Payer: Self-pay | Admitting: *Deleted

## 2011-07-20 DIAGNOSIS — I10 Essential (primary) hypertension: Secondary | ICD-10-CM

## 2011-07-20 MED ORDER — FUROSEMIDE 40 MG PO TABS: 40.0000 mg | ORAL_TABLET | Freq: Every day | ORAL | Status: DC

## 2011-08-21 ENCOUNTER — Other Ambulatory Visit: Payer: Self-pay | Admitting: *Deleted

## 2011-08-21 DIAGNOSIS — I10 Essential (primary) hypertension: Secondary | ICD-10-CM

## 2011-08-21 MED ORDER — ATENOLOL 100 MG PO TABS: 100.0000 mg | ORAL_TABLET | Freq: Every day | ORAL | Status: DC

## 2011-09-07 ENCOUNTER — Encounter: Payer: BC Managed Care – PPO | Admitting: Internal Medicine

## 2011-09-21 ENCOUNTER — Other Ambulatory Visit: Payer: Self-pay | Admitting: *Deleted

## 2011-09-21 DIAGNOSIS — K219 Gastro-esophageal reflux disease without esophagitis: Secondary | ICD-10-CM

## 2011-09-22 MED ORDER — FAMOTIDINE 20 MG PO TABS: 20.0000 mg | ORAL_TABLET | Freq: Two times a day (BID) | ORAL | Status: DC

## 2011-09-29 ENCOUNTER — Other Ambulatory Visit: Payer: Self-pay | Admitting: *Deleted

## 2011-09-29 DIAGNOSIS — I10 Essential (primary) hypertension: Secondary | ICD-10-CM

## 2011-09-29 MED ORDER — AMLODIPINE BESYLATE 10 MG PO TABS: 10.0000 mg | ORAL_TABLET | Freq: Every day | ORAL | Status: DC

## 2011-09-29 MED ORDER — FUROSEMIDE 40 MG PO TABS: 40.0000 mg | ORAL_TABLET | Freq: Every day | ORAL | Status: DC

## 2011-10-27 ENCOUNTER — Other Ambulatory Visit: Payer: Self-pay | Admitting: *Deleted

## 2011-10-27 DIAGNOSIS — E039 Hypothyroidism, unspecified: Secondary | ICD-10-CM

## 2011-10-27 MED ORDER — LEVOTHYROXINE SODIUM 100 MCG PO TABS: 100.0000 ug | ORAL_TABLET | Freq: Every day | ORAL | Status: DC

## 2011-11-02 ENCOUNTER — Ambulatory Visit (INDEPENDENT_AMBULATORY_CARE_PROVIDER_SITE_OTHER): Payer: Self-pay | Admitting: Internal Medicine

## 2011-11-02 ENCOUNTER — Encounter: Payer: Self-pay | Admitting: Internal Medicine

## 2011-11-02 ENCOUNTER — Ambulatory Visit (HOSPITAL_COMMUNITY)
Admission: RE | Admit: 2011-11-02 | Discharge: 2011-11-02 | Disposition: A | Discharge status: Home / Self Care | Payer: Self-pay | Source: Ambulatory Visit | Attending: Internal Medicine | Admitting: Internal Medicine

## 2011-11-02 VITALS — BP 142/75 | HR 73 | Temp 97.3°F | Ht 60.0 in | Wt 189.8 lb

## 2011-11-02 DIAGNOSIS — M25569 Pain in unspecified knee: Secondary | ICD-10-CM | POA: Insufficient documentation

## 2011-11-02 DIAGNOSIS — M1990 Unspecified osteoarthritis, unspecified site: Secondary | ICD-10-CM

## 2011-11-02 DIAGNOSIS — M109 Gout, unspecified: Secondary | ICD-10-CM

## 2011-11-02 DIAGNOSIS — E876 Hypokalemia: Secondary | ICD-10-CM

## 2011-11-02 DIAGNOSIS — IMO0002 Reserved for concepts with insufficient information to code with codable children: Secondary | ICD-10-CM | POA: Insufficient documentation

## 2011-11-02 DIAGNOSIS — G4733 Obstructive sleep apnea (adult) (pediatric): Secondary | ICD-10-CM

## 2011-11-02 DIAGNOSIS — M1710 Unilateral primary osteoarthritis, unspecified knee: Secondary | ICD-10-CM | POA: Insufficient documentation

## 2011-11-02 DIAGNOSIS — I10 Essential (primary) hypertension: Secondary | ICD-10-CM

## 2011-11-02 DIAGNOSIS — Z79899 Other long term (current) drug therapy: Secondary | ICD-10-CM

## 2011-11-02 DIAGNOSIS — E039 Hypothyroidism, unspecified: Secondary | ICD-10-CM

## 2011-11-02 DIAGNOSIS — E785 Hyperlipidemia, unspecified: Secondary | ICD-10-CM

## 2011-11-02 DIAGNOSIS — B372 Candidiasis of skin and nail: Secondary | ICD-10-CM

## 2011-11-02 DIAGNOSIS — R21 Rash and other nonspecific skin eruption: Secondary | ICD-10-CM

## 2011-11-02 LAB — CBC
MCH: 27.1 pg (ref 26.0–34.0)
MCV: 79.9 fL (ref 78.0–100.0)
Platelets: 270 10*3/uL (ref 150–400)
RBC: 5.17 MIL/uL — ABNORMAL HIGH (ref 3.87–5.11)

## 2011-11-02 MED ORDER — NYSTATIN 100000 UNIT/GM EX CREA: TOPICAL_CREAM | Freq: Two times a day (BID) | CUTANEOUS | Status: DC

## 2011-11-02 MED ORDER — ACETAMINOPHEN 325 MG PO TABS: 650.0000 mg | ORAL_TABLET | Freq: Four times a day (QID) | ORAL | Status: DC | PRN

## 2011-11-02 NOTE — Progress Notes (Signed)
Subjective:   Patient ID: Lydia Walsh female   DOB: 01-03-1956 56 y.o.   MRN: [id number]  HPI: [name] is a 56 y.o.  female with past medical history significant as outlined below who presented to the clinic for medication refill. Patient was last seen in October of 2012. Since couple last clinic visit with me patient was evaluated in the emergency room for acute allergic reaction to some kind of ingredients and crackers. She had a swollen tongue and lip as well as throat and was evaluated in the emergency room.  She noted she did not had any other surgeries or emergency visit.  The patient reports about severe bilateral knee pain. She had episodes when her knees would give out on her and the feeling of locked in. She denies any rashes but occasionally swelling of the joints.  Patient reports that about area of redness and itching under her knees the breast bilaterally.  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  Past Medical History  Diagnosis Date  . Hyperlipidemia   . Hypertension   . Thyroid disease     hypothyroidism, well controlled  . Anemia     iron deficiency anemia  . Osteoarthritis   . Sleep apnea    Current Outpatient Prescriptions  Medication Sig Dispense Refill  . amLODipine (NORVASC) 10 MG tablet Take 1 tablet (10 mg total) by mouth daily.  90 tablet  0  . atenolol (TENORMIN) 100 MG tablet Take 1 tablet (100 mg total) by mouth daily.  30 tablet  2  . CRESTOR 20 MG tablet TAKE ONE TABLET BY MOUTH EVERY DAY  30 each  2  . famotidine (PEPCID) 20 MG tablet Take 1 tablet (20 mg total) by mouth 2 (two) times daily.  60 tablet  1  . furosemide (LASIX) 40 MG tablet Take 1 tablet (40 mg total) by mouth daily.  90 tablet  0  . levothyroxine (SYNTHROID, LEVOTHROID) 100 MCG tablet Take 1 tablet (100 mcg total) by mouth daily.  30 tablet  0  . potassium chloride (EPIKLOR) 20 MEQ packet Dissolve one packet in 4 ounces of  water or other beverage and take once a day.  30 packet  3  . predniSONE (DELTASONE) 10 MG tablet Take 6 tabs on day one, take 5 tablets on day 2, take 4 tabs on day 3, take 3 tabs on day 4, take 2 tabs on day 5, take 1 tablet on day 6.  21 tablet  0  . DISCONTD: esomeprazole (NEXIUM) 20 MG capsule Take 2 capsules (40 mg total) by mouth daily before breakfast.  30 capsule  3  . DISCONTD: KLOR-CON M20 20 MEQ tablet        No family history on file. History   Social History  . Marital Status: Divorced    Spouse Name: N/A    Number of Children: N/A  . Years of Education: N/A   Social History Main Topics  . Smoking status: Former Games developer  . Smokeless tobacco: None   Comment: suit x 59yrs.  . Alcohol Use: No  . Drug Use: No  . Sexually Active: None   Other Topics Concern  . None   Social History Narrative   Lives with her son.  Perimenopausal.   Review of Systems: Bold if positive  Constitutional:  fever, chills, diaphoresis, appetite change and fatigue.  Respiratory:  SOB, DOE, cough, chest tightness,  and wheezing.  Cardiovascular:  chest pain, palpitations and leg swelling.  Gastrointestinal:nausea, vomiting, abdominal pain, diarrhea, constipation, blood in stool and abdominal distention.  Genitourinary:dysuria, urgency, frequency, hematuria, flank pain and difficulty urinating. .  Neurological:dizziness, syncope, weakness, light-headedness, numbness and headaches.    Objective:  Physical Exam: Filed Vitals:   11/02/11 1514  BP: 142/75  Pulse: 73  Temp: 97.3 F (36.3 C)  TempSrc: Oral  Height: 5' (1.524 m)  Weight: 189 lb 12.8 oz (86.093 kg)  SpO2: 97%   Constitutional: Vital signs reviewed.  Patient is a well-developed and well-nourished female in no acute distress and cooperative with exam. Alert and oriented x3.   Neck: Supple,  Cardiovascular: RRR, S1 normal, S2 normal, no MRG, pulses symmetric and intact bilaterally Breast: Erythematous changes noted in the  knees the breasts bilaterally Pulmonary/Chest: CTAB, no wheezes, rales, or rhonchi Abdominal: Soft. Non-tender, non-distended, bowel sounds are normal, GU: no CVA tenderness MSK: Tenderness to palpation of both knees. Decreased range of motion due to pain. Mild swelling noted. No erythema.  Neurological: A&O x3,  no focal motor deficit, sensory intact to light touch bilaterally.  Skin: Warm, dry and intact. No rash, cyanosis, or clubbing.

## 2011-11-03 LAB — LIPID PANEL
Cholesterol: 338 mg/dL — ABNORMAL HIGH (ref 0–200)
Total CHOL/HDL Ratio: 5.6 Ratio
Triglycerides: 98 mg/dL (ref <150)
VLDL: 20 mg/dL (ref 0–40)

## 2011-11-03 LAB — COMPREHENSIVE METABOLIC PANEL
Albumin: 4.3 g/dL (ref 3.5–5.2)
Alkaline Phosphatase: 101 U/L (ref 39–117)
BUN: 8 mg/dL (ref 6–23)
Glucose, Bld: 92 mg/dL (ref 70–99)
Total Bilirubin: 0.9 mg/dL (ref 0.3–1.2)

## 2011-11-09 DIAGNOSIS — E876 Hypokalemia: Secondary | ICD-10-CM | POA: Insufficient documentation

## 2011-11-09 DIAGNOSIS — B372 Candidiasis of skin and nail: Secondary | ICD-10-CM | POA: Insufficient documentation

## 2011-11-09 NOTE — Assessment & Plan Note (Signed)
Patient is currently not using any sleep apnea. She was supposed to followup with Dr. Maple Hudson which she did not. Patient currently has no insurance but will try to obtain orange card.

## 2011-11-09 NOTE — Assessment & Plan Note (Signed)
Patient had history of gout in the past but is currently not taking any medication. I'll obtain uric acid.  Update: Uric acid level is 9.3. The patient should be started on colchicine and allopurinol.

## 2011-11-09 NOTE — Assessment & Plan Note (Signed)
Patient reports that she has been taking on and not her Synthroid. I will obtain TSH today  Update; TSH is 24.07. I have been trying to contact the patient to discuss the lab results and obtain a more detailed information about if patient has been taking her Synthroid on a daily basis. Unfortunately I have been unsuccessful. Will send a letter to the patient to inform her that she needs to call the clinic and provide a phone number.

## 2011-11-09 NOTE — Assessment & Plan Note (Signed)
Patient was found to have hypokalemia with a K of 3. Again I was not able to contact the patient to inform her about the lab results.

## 2011-11-09 NOTE — Assessment & Plan Note (Addendum)
Blood pressure elevated. Will continue Norvasc 10 mg daily for now. Will obtain basic metabolic function for possible changes in management. Atenolol had to be continued since patient noted swelling of her joints whenever she would take atenolol.  BP: 142/75 mmHg   Update; renal function is within normal limits. Consider hydrochlorothiazide for blood pressure control along with potassium

## 2011-11-09 NOTE — Assessment & Plan Note (Addendum)
Bilateral sever knee pain. Will obtain x-rays.  Update:  Knee xray 11/02/11: Left Moderate tricompartmental degenerative changes, progressed from 2007. Right: Moderate tricompartmental degenerative changes. Suspected small suprapatellar knee joint effusion. Patient should be referred to orthopedic or sports medicine but unfortunate patient has currently no insurance. She will try to obtain orange card. Until then we have to treat patient symptomatically. Recommend Tylenol.

## 2011-11-09 NOTE — Assessment & Plan Note (Signed)
Nystatin cream when necessary

## 2011-11-09 NOTE — Assessment & Plan Note (Signed)
The patient reports she has not been able to take Crestor due to financial restraints. I will obtain lipid panel today  Update;  Lab Results  Component Value Date   CHOL 338* 11/02/2011   HDL 60 11/02/2011   LDLCALC 258* 11/02/2011   TRIG 98 11/02/2011   CHOLHDL 5.6 11/02/2011    Patient should be started on a statin. Patient does not have diabetes before her LDL goal would be less than 130.

## 2011-11-27 ENCOUNTER — Ambulatory Visit (INDEPENDENT_AMBULATORY_CARE_PROVIDER_SITE_OTHER): Payer: Self-pay | Admitting: Internal Medicine

## 2011-11-27 VITALS — BP 149/93 | HR 100 | Temp 98.2°F | Wt 186.7 lb

## 2011-11-27 DIAGNOSIS — E785 Hyperlipidemia, unspecified: Secondary | ICD-10-CM

## 2011-11-27 DIAGNOSIS — E039 Hypothyroidism, unspecified: Secondary | ICD-10-CM

## 2011-11-27 DIAGNOSIS — I10 Essential (primary) hypertension: Secondary | ICD-10-CM

## 2011-11-27 DIAGNOSIS — M109 Gout, unspecified: Secondary | ICD-10-CM

## 2011-11-27 DIAGNOSIS — E876 Hypokalemia: Secondary | ICD-10-CM

## 2011-11-27 MED ORDER — POTASSIUM CHLORIDE 20 MEQ PO PACK: 20.0000 meq | PACK | Freq: Two times a day (BID) | ORAL | Status: DC

## 2011-11-27 MED ORDER — ALLOPURINOL 100 MG PO TABS: 100.0000 mg | ORAL_TABLET | Freq: Every day | ORAL | Status: DC

## 2011-11-27 MED ORDER — FUROSEMIDE 40 MG PO TABS: 40.0000 mg | ORAL_TABLET | Freq: Every day | ORAL | Status: DC

## 2011-11-27 MED ORDER — COLCHICINE 0.6 MG PO TABS: 0.6000 mg | ORAL_TABLET | Freq: Every day | ORAL | Status: DC

## 2011-11-27 MED ORDER — LEVOTHYROXINE SODIUM 125 MCG PO TABS: 125.0000 ug | ORAL_TABLET | Freq: Every day | ORAL | Status: DC

## 2011-11-27 MED ORDER — PRAVASTATIN SODIUM 80 MG PO TABS: 80.0000 mg | ORAL_TABLET | Freq: Every evening | ORAL | Status: DC

## 2011-11-27 NOTE — Assessment & Plan Note (Signed)
Her elevated total cholesterol and LDL are likely related to her uncontrolled hypothyroidism. Acc to ATP III risk calculator, her score is 7% and according to ATP 3 guidelines the goal LDL to start a statin for her would be 160 mg/dl. -Would start her on pravastatin 80 mg a day. -Recheck her lipid panel in 3 months. Anticipate that her total cholesterol and LDL would be lower if she would be compliant with her Synthroid. At that time , would consider decreasing the dose of her pravastatin.

## 2011-11-27 NOTE — Assessment & Plan Note (Signed)
Did not check her potassium as she was not on any repletion since her last lab draw and is symptomatic- complaining of leg cramps. -Refill her Klor-Con. -Recheck BMET with next visit in a month

## 2011-11-27 NOTE — Assessment & Plan Note (Signed)
Lab Results  Component Value Date   NA 140 11/02/2011   K 3.0* 11/02/2011   CL 97 11/02/2011   CO2 32 11/02/2011   BUN 8 11/02/2011   CREATININE 0.56 11/02/2011   CREATININE 0.81 04/26/2009    BP Readings from Last 3 Encounters:  11/27/11 149/93  11/02/11 142/75  04/08/11 186/90    Assessment: Hypertension control:  mildly elevated  Progress toward goals:  deteriorated Barriers to meeting goals:  no barriers identified  Plan: Hypertension treatment:  She has been on amlodipine. She also had a bottle of Lasix that she voiced that she was taking for leg swelling. We'll continue her on amlodipine and Lasix for now. Would avoid HCTZ given her history of gout. Reschedule a followup appointment in a month. If her blood pressure continues to be elevated we may consider increasing the dose of Lasix. Of note atenolol was discontinued in the setting of itching ( reported by the patient )and ? joint swelling

## 2011-11-27 NOTE — Assessment & Plan Note (Signed)
Her TSH was elevated to 24.07 likely in the setting of medication noncompliance. But given modest elevation in her TSH, would increase the dose of Synthroid from 100 to 125 mcg per day. She still  had some leftover pills from 100 mcg and would like to finish them first before taking the new dose. -Would recheck her TSH in 6-8 weeks. -She was advised to take her thyroid medication empty stomach every morning.

## 2011-11-27 NOTE — Progress Notes (Signed)
Subjective:   Patient ID: Lydia Walsh female   DOB: Feb 16, 1955 56 y.o.   MRN: [id number]  HPI: 56 year old woman with past medical history significant for hyperlipidemia, hypertension, hypothyroidism presents to the clinic for a followup on her labs.  She states that she has been feeling somewhat stressed lately because she got invicted and had to move with her son.  Hypothyroidism: She states that she missed her medicine for 1 week but now she has been taking them regularly for last few days. She takes the medicine any time during the day but not empty stomach in the morning.  Hypokalemia: She was noted to have potassium of 3.0 with her last lab draw on 11/02/2011. + leg cramps  Gout -She complains of some pain in her great toe off and on but not like when she had her flare. Her last flare was 2 years ago.   Past Medical History  Diagnosis Date  . Hyperlipidemia   . Hypertension   . Thyroid disease     hypothyroidism, well controlled  . Anemia     iron deficiency anemia  . Osteoarthritis   . Sleep apnea    No family history on file. History   Social History  . Marital Status: Divorced    Spouse Name: N/A    Number of Children: N/A  . Years of Education: N/A   Occupational History  . Not on file.   Social History Main Topics  . Smoking status: Former Games developer  . Smokeless tobacco: Not on file     Comment: suit x 88yrs.  . Alcohol Use: No  . Drug Use: No  . Sexually Active: Not on file   Other Topics Concern  . Not on file   Social History Narrative   Lives with her son.  Perimenopausal.   Review of Systems: General: Denies fever, chills, diaphoresis, appetite change and fatigue. HEENT: Denies photophobia, eye pain, redness, hearing loss, ear pain, congestion, sore throat, rhinorrhea, sneezing, mouth sores, trouble swallowing, neck pain, neck stiffness and tinnitus. Respiratory: Denies SOB, DOE, cough, chest tightness, and wheezing. Cardiovascular: Denies to chest  pain, palpitations and leg swelling. Gastrointestinal: Denies nausea, vomiting, abdominal pain, diarrhea, constipation, blood in stool and abdominal distention. Genitourinary: Denies dysuria, urgency, frequency, hematuria, flank pain and difficulty urinating. Musculoskeletal: Denies myalgias, back pain, joint swelling, arthralgias and gait problem.  Skin: Denies pallor, rash and wound. Neurological: Denies dizziness, seizures, syncope, weakness, light-headedness, numbness and headaches. Hematological: Denies adenopathy, easy bruising, personal or family bleeding history. Psychiatric/Behavioral: Denies suicidal ideation, mood changes, confusion, nervousness, sleep disturbance and agitation.    Current Outpatient Medications: Current Outpatient Prescriptions  Medication Sig Dispense Refill  . acetaminophen (TYLENOL) 325 MG tablet Take 2 tablets (650 mg total) by mouth every 6 (six) hours as needed for pain.      Marland Kitchen amLODipine (NORVASC) 10 MG tablet Take 1 tablet (10 mg total) by mouth daily.  90 tablet  0  . levothyroxine (SYNTHROID, LEVOTHROID) 100 MCG tablet Take 1 tablet (100 mcg total) by mouth daily.  30 tablet  0  . nystatin cream (MYCOSTATIN) Apply topically 2 (two) times daily. Under the breast  30 g  0  . [DISCONTINUED] esomeprazole (NEXIUM) 20 MG capsule Take 2 capsules (40 mg total) by mouth daily before breakfast.  30 capsule  3  . [DISCONTINUED] KLOR-CON M20 20 MEQ tablet         Allergies: Allergies  Allergen Reactions  . Ace Inhibitors  REACTION: cough      Objective:   Physical Exam: Filed Vitals:   11/27/11 1317  BP: 167/97  Pulse: 107  Temp: 98.2 F (36.8 C)    General: Vital signs reviewed and noted. Well-developed, well-nourished, in no acute distress; alert, appropriate and cooperative throughout examination. Head: Normocephalic, atraumatic Lungs: Normal respiratory effort. Clear to auscultation BL without crackles or wheezes. Heart: RRR. S1 and S2  normal without gallop, murmur, or rubs. Abdomen:BS normoactive. Soft, Nondistended, non-tender.  No masses or organomegaly. Extremities: No pretibial edema.     Assessment & Plan:

## 2011-11-27 NOTE — Assessment & Plan Note (Signed)
Last gout flare was 2 years ago. To prevent recurrent gouty attacks would start her on colchicine and allopurinol. She would just need colchicine for 6 months to prevent the precipitation of new gouty attacks with the uricosuric agent.

## 2011-11-27 NOTE — Patient Instructions (Addendum)
Please schedule a follow up appointment in 1 month with your PCP. Please bring your medication bottles with your next appointment. Please take your medicines as prescribed.  

## 2011-12-28 ENCOUNTER — Telehealth: Payer: Self-pay | Admitting: *Deleted

## 2011-12-28 NOTE — Telephone Encounter (Signed)
Pt called - has had pain in left knee past 2 weeks. Has no insurance. In past had surgery on both knees by Dr Luiz Blare. Pt wants pain med for left knee. Appt given 12/30/11 2:45PM Dr Saralyn Pilar - Had to wait till son could bring pt. Stanton Kidney Masaki Rothbauer RN 12/28/11 10:30AM

## 2011-12-30 ENCOUNTER — Ambulatory Visit (INDEPENDENT_AMBULATORY_CARE_PROVIDER_SITE_OTHER): Payer: Self-pay | Admitting: Internal Medicine

## 2011-12-30 ENCOUNTER — Encounter: Payer: Self-pay | Admitting: Internal Medicine

## 2011-12-30 VITALS — BP 158/93 | HR 104 | Temp 97.5°F | Ht 60.0 in | Wt 183.7 lb

## 2011-12-30 DIAGNOSIS — M1990 Unspecified osteoarthritis, unspecified site: Secondary | ICD-10-CM

## 2011-12-30 DIAGNOSIS — E876 Hypokalemia: Secondary | ICD-10-CM

## 2011-12-30 MED ORDER — TRAMADOL HCL 50 MG PO TABS: 50.0000 mg | ORAL_TABLET | Freq: Four times a day (QID) | ORAL | Status: DC | PRN

## 2011-12-30 NOTE — Progress Notes (Signed)
Patient: Lydia Walsh   MRN: [id number]  DOB: 1955/05/26  PCP: Almyra Deforest, MD   Subjective:    HPI: Ms. JESSI PITSTICK is a 56 y.o. female with a PMHx of hypothyroidism, HTN, HLD and known BL tricompartmental osteoarthritis, who presented to clinic today for the following:  1) Knee pain -  Patient describes a several year history of bilateral knee pain with prior knee arthroscopic surgery for meniscal tear BL in 1996 and 1997). Today, she complains of  2 week history of worsening knee pain. It is a constant, aching bilateral knee pain with Left > right. Currently, the pain is rated a 10/10 in severity. At its worst, the pain is rated a 10/10 in severity. Has previously gotten knee steroid injections, which were very helpful (last in 03/2011). She has previously followed by Dr. Luiz Blare (but can no longer afford to go 2/2 loss of insurance). Pain is worse with activity she denies inciting injury. Prior history of related problems: no prior problems with this area in the past, previous surgery of meniscus. Admits to associated crepitus sensation, popping sensation and stiffness and occasional swelling Denies associated fevers, chills, redness, warmth.   Review of Systems: Per HPI.   Current Outpatient Medications: Medication Sig  . allopurinol (ZYLOPRIM) 100 MG tablet Take 1 tablet (100 mg total) by mouth daily.  Marland Kitchen amLODipine (NORVASC) 10 MG tablet Take 1 tablet (10 mg total) by mouth daily.  . furosemide (LASIX) 40 MG tablet Take 1 tablet (40 mg total) by mouth daily.  Marland Kitchen levothyroxine (SYNTHROID) 125 MCG tablet Take 1 tablet (125 mcg total) by mouth daily.  Marland Kitchen acetaminophen (TYLENOL) 325 MG tablet Take 2 tablets (650 mg total) by mouth every 6 (six) hours as needed for pain.  Marland Kitchen colchicine 0.6 MG tablet Take 1 tablet (0.6 mg total) by mouth daily.  . pravastatin (PRAVACHOL) 80 MG tablet Take 1 tablet (80 mg total) by mouth every evening.     Allergies  Allergen Reactions  . Ace  Inhibitors     REACTION: itching    Past Medical History  Diagnosis Date  . Hyperlipidemia   . Hypertension   . Hypothyroidism   . Anemia     iron deficiency anemia  . Osteoarthritis   . Sleep apnea     Past Surgical History  Procedure Date  . Myomectomy      Objective:    Physical Exam: Filed Vitals:   12/30/11 1441  BP: 158/93  Pulse: 104  Temp: 97.5 F (36.4 C)      General: Vital signs reviewed and noted. Well-developed, well-nourished, in no acute distress; alert, appropriate and cooperative throughout examination.  Head: Normocephalic, atraumatic.  Lungs:  Normal respiratory effort. Clear to auscultation BL without crackles or wheezes.  Heart: RRR. S1 and S2 normal without gallop, rubs. No murmur.  Abdomen:  BS normoactive. Soft, Nondistended, non-tender.  No masses or organomegaly.  Extremities: No pretibial edema. Bilateral knees - crepitation noted with some patellofemoral grind. Fullness and possibly very mild effusion noted. No apparent erythema, warmth. No ligamental laxity.    Assessment/ Plan:    Case and plan of care discussed with attending physician, Dr. Jonah Blue.     PROCEDURE NOTE  PROCEDURE: left knee joint steroid injection.  PREOPERATIVE DIAGNOSIS: Osteoarthritis of the left knee.  POSTOPERATIVE DIAGNOSIS: Osteoarthritis of the left knee.  PROCEDURE: The patient was apprised of the risks and the benefits of the procedure and informed consent was obtained, as witnessed  by Theotis Barrio and Dr. Kem Kays. Time-out procedure was performed, with confirmation of the patient's name, date of birth, and correct identification of the knee to be injected. The patient's knee was then marked at the appropriate site for injection placement. The patient's left knee was sterilely prepped with Betadine. A 80 mg (2 milliliter) solution of Kenalog was drawn up into a 5 mL syringe with a 2 mL of 1% lidocaine. The patient was injected with a 22-gauze needle  at the lateral aspect of her left flexed knee. There were no complications. The patient tolerated the procedure well. There was minimal bleeding. The patient was instructed to ice her knee upon leaving clinic and refrain from overuse over the next 3 days. The patient was instructed to go to the emergency room with any usual pain, swelling, or redness occurred in the injected area. The patient was given a followup appointment to evaluate response to the injection to his increased range of motion and reduction of pain.  The procedure was supervised by the attending physician, Jonah Blue.

## 2011-12-30 NOTE — Patient Instructions (Signed)
General Instructions:  Please follow-up at the clinic in 1 month WITH YOUR PCP, at which time we will reevaluate YOUR KNEE PAIN - OR, please follow-up in the clinic sooner if needed.  There have been changes in your medications:  START TRAMADOL for your knee pain - You have been started on a new medication that can cause drowsiness, do not drive or operate heavy machinery . Do not take this medication with alcohol.     You are getting labs today, if they are abnormal I will give you a call.   If you have been started on new medication(s), and you develop symptoms concerning for allergic reaction, including, but not limited to, throat closing, tongue swelling, rash, please stop the medication immediately and call the clinic at [phone], and go to the ER.  If you are diabetic, please bring your meter to your next visit.  If symptoms worsen, or new symptoms arise, please call the clinic or go to the ER.  PLEASE BRING ALL OF YOUR MEDICATIONS  IN A BAG TO YOUR NEXT APPOINTMENT   Treatment Goals:  Goals (1 Years of Data) as of 12/30/2011          11/02/11 04/26/09     Diet    . Reduce calorie intake to 1500  calories per day       . Reduce fat intake to 42 grams per day        Lifestyle    . Increase physical activity to 30-60 minutes a day        Result Component    . LDL CALC < 130  258 112      Progress Toward Treatment Goals:    Self Care Goals & Plans:  Self Care Goal 12/30/2011  Eat healthy foods eat foods that are low in salt      Joint Injection Care After Refer to this sheet in the next few days. These instructions provide you with information on caring for yourself after you have had a joint injection. Your caregiver also may give you more specific instructions. Your treatment has been planned according to current medical practices, but problems sometimes occur. Call your caregiver if you have any problems or questions after your procedure. After any type of  joint injection, it is not uncommon to experience:  Soreness, swelling, or bruising around the injection site.   Mild numbness, tingling, or weakness around the injection site caused by the numbing medicine used before or with the injection.  It also is possible to experience the following effects associated with the specific agent after injection:  Iodine-based contrast agents:   Allergic reaction (itching, hives, widespread redness, and swelling beyond the injection site).   Corticosteroids (These effects are rare.):   Allergic reaction.   Increased blood sugar levels (If you have diabetes and you notice that your blood sugar levels have increased, notify your caregiver).   Increased blood pressure levels.   Mood swings.   Hyaluronic acid in the use of viscosupplementation.   Temporary heat or redness.   Temporary rash and itching.   Increased fluid accumulation in the injected joint.  These effects all should resolve within a day after your procedure.   HOME CARE INSTRUCTIONS  Limit yourself to light activity the day of your procedure. Avoid lifting heavy objects, bending, stooping, or twisting.   Take prescription or over-the-counter pain medication as directed by your caregiver.   You may apply ice to your injection site to reduce pain  and swelling the day of your procedure. Ice may be applied 3 to 4 times:   Put ice in a plastic bag.   Place a towel between your skin and the bag.   Leave the ice on for no longer than 15 to 20 minutes each time.  SEEK IMMEDIATE MEDICAL CARE IF:    Pain and swelling get worse rather than better or extend beyond the injection site.   Numbness does not go away.   Blood or fluid continues to leak from the injection site.   You have chest pain.   You have swelling of your face or tongue.   You have trouble breathing or you become dizzy.   You develop a fever, chills, or severe tenderness at the injection site that last longer  than 1 day.  MAKE SURE YOU:  Understand these instructions.   Watch your condition.   Get help right away if you are not doing well or if you get worse.  Document Released: 09/11/2010 Document Revised: 03/23/2011 Document Reviewed: 09/11/2010 [facility] Patient Information 2013 [place], [place].

## 2011-12-31 LAB — BASIC METABOLIC PANEL
CO2: 27 mEq/L (ref 19–32)
Chloride: 102 mEq/L (ref 96–112)
Creat: 0.6 mg/dL (ref 0.50–1.10)
Potassium: 3.6 mEq/L (ref 3.5–5.3)

## 2012-01-01 ENCOUNTER — Other Ambulatory Visit: Payer: Self-pay | Admitting: Internal Medicine

## 2012-01-01 DIAGNOSIS — E785 Hyperlipidemia, unspecified: Secondary | ICD-10-CM

## 2012-01-01 MED ORDER — ROSUVASTATIN CALCIUM 5 MG PO TABS: 5.0000 mg | ORAL_TABLET | Freq: Every day | ORAL | Status: DC

## 2012-01-02 NOTE — Assessment & Plan Note (Signed)
Pertinent Data:  Ref. Range 10/15/2010  11/02/2011 12/30/2011   Potassium Latest Range: 3.5-5.3 mEq/L 3.5 3.0 (L) 3.6    Assessment: Patient was noted to be mildly hypokalemic during last visit, likely in setting of her lasix usage.  Plan:      Rechecked BMET today, indicating appropriate K repletion.  Continue current.

## 2012-01-02 NOTE — Assessment & Plan Note (Addendum)
Pertinent Data: Knee xray 11/02/11:  Left Moderate tricompartmental degenerative changes, progressed from 2007.  Right: Moderate tricompartmental degenerative changes. Suspected small suprapatellar knee joint effusion.   Assessment: Patient's exam and history is consistent with the osteoarthritis noted on XR. She was previously followed by Dr. Luiz Blare (ortho) who she says wanted to attempt conservative measures first before proceeding with knee replacement. Steroid injections historically provided great relief of the pain. Patient denied potential contraindications to steroid injection such as infection of a joint, skin infection at the site, allergic reaction to previous cortisone injections, usage of blood thinners, acute injury (head trauma, broken bones)  Plan:      Steroid injection provided (see procedure note)  Post-procedural instructions were provided.  Tramadol was prescribed for pain - cautions regarding sedative effects and avoidance of alcohol was provided. Patient informed of the red flag symptoms that should prompt her immediate return for reevaluation - such as, but not limited to the following: worsening pain, warmth/redness/swelling of the knee, overall feeling of malaise. She expresses understanding of the information provided.

## 2012-01-18 ENCOUNTER — Other Ambulatory Visit: Payer: Self-pay | Admitting: *Deleted

## 2012-01-18 DIAGNOSIS — E039 Hypothyroidism, unspecified: Secondary | ICD-10-CM

## 2012-01-20 ENCOUNTER — Other Ambulatory Visit: Payer: Self-pay | Admitting: Internal Medicine

## 2012-01-20 DIAGNOSIS — E039 Hypothyroidism, unspecified: Secondary | ICD-10-CM

## 2012-01-22 ENCOUNTER — Ambulatory Visit (INDEPENDENT_AMBULATORY_CARE_PROVIDER_SITE_OTHER): Payer: Self-pay | Admitting: Internal Medicine

## 2012-01-22 DIAGNOSIS — E039 Hypothyroidism, unspecified: Secondary | ICD-10-CM

## 2012-01-22 LAB — BASIC METABOLIC PANEL
BUN: 18 mg/dL (ref 6–23)
CO2: 30 mEq/L (ref 19–32)
Calcium: 10.5 mg/dL (ref 8.4–10.5)
Chloride: 101 mEq/L (ref 96–112)
Creat: 0.73 mg/dL (ref 0.50–1.10)
Glucose, Bld: 117 mg/dL — ABNORMAL HIGH (ref 70–99)
Potassium: 3.7 mEq/L (ref 3.5–5.3)

## 2012-01-22 NOTE — Progress Notes (Signed)
Pt here for lab visit only, unable to close encounter due to incorrect encounter type.  Will have ordering MD close encounter.Lydia Spittle Cassady1/10/201410:45 AM

## 2012-01-25 MED ORDER — LEVOTHYROXINE SODIUM 125 MCG PO TABS: 125.0000 ug | ORAL_TABLET | Freq: Every day | ORAL | Status: DC

## 2012-01-28 NOTE — Telephone Encounter (Signed)
Rx called in to pharmacy. 

## 2012-02-08 ENCOUNTER — Other Ambulatory Visit: Payer: Self-pay | Admitting: Internal Medicine

## 2012-02-29 ENCOUNTER — Ambulatory Visit (HOSPITAL_COMMUNITY)
Admission: RE | Admit: 2012-02-29 | Discharge: 2012-02-29 | Disposition: A | Discharge status: Home / Self Care | Payer: Self-pay | Source: Ambulatory Visit | Attending: Internal Medicine | Admitting: Internal Medicine

## 2012-02-29 ENCOUNTER — Ambulatory Visit (INDEPENDENT_AMBULATORY_CARE_PROVIDER_SITE_OTHER): Payer: Self-pay | Admitting: Internal Medicine

## 2012-02-29 ENCOUNTER — Encounter: Payer: Self-pay | Admitting: Internal Medicine

## 2012-02-29 VITALS — BP 144/89 | HR 113 | Temp 97.4°F | Ht 60.0 in | Wt 179.0 lb

## 2012-02-29 DIAGNOSIS — E785 Hyperlipidemia, unspecified: Secondary | ICD-10-CM

## 2012-02-29 DIAGNOSIS — R002 Palpitations: Secondary | ICD-10-CM | POA: Insufficient documentation

## 2012-02-29 DIAGNOSIS — E876 Hypokalemia: Secondary | ICD-10-CM

## 2012-02-29 DIAGNOSIS — R21 Rash and other nonspecific skin eruption: Secondary | ICD-10-CM

## 2012-02-29 DIAGNOSIS — I10 Essential (primary) hypertension: Secondary | ICD-10-CM

## 2012-02-29 DIAGNOSIS — E039 Hypothyroidism, unspecified: Secondary | ICD-10-CM

## 2012-02-29 DIAGNOSIS — M109 Gout, unspecified: Secondary | ICD-10-CM

## 2012-02-29 NOTE — Assessment & Plan Note (Signed)
Differential diagnoses include overdose of Synthroid. Patient EKG was notable for sinus tachycardia without any significant changes. Patient is afebrile. No signs of infection. No history of blood loss. I will obtain TSH today for possible changes in management. If that is within normal limits the have to reevaluate patient again. Consider CBC and orthostatic blood pressure at that time although patient denies any dizziness.

## 2012-02-29 NOTE — Patient Instructions (Addendum)
Please call the pharmacy early enough to get refills on the medications. ( 1 week before at least)  If you have any questions please call the clinic .

## 2012-02-29 NOTE — Assessment & Plan Note (Signed)
Blood pressure is elevated during this office visit. Patient is currently on Norvasc 10 mg daily and Lasix 40 mg daily.

## 2012-02-29 NOTE — Assessment & Plan Note (Signed)
Obtain basic metabolic panel today.

## 2012-02-29 NOTE — Assessment & Plan Note (Signed)
Likely allergic reaction to do to shin.

## 2012-02-29 NOTE — Assessment & Plan Note (Signed)
I would obtain TSH today for possible changes in management especially in the setting of palpitation. I discussed with patient about the importance of complying with this medication on a daily basis. She should call the pharmacy one to 2 weeks prior running out of her medication.

## 2012-02-29 NOTE — Assessment & Plan Note (Signed)
Patient was started on Crestor 5 mg daily during the last office visit. I will obtain lipid panel today for possible changes in management.

## 2012-02-29 NOTE — Progress Notes (Signed)
Subjective:   Patient ID: Lydia Walsh female   DOB: [birth year] 57 y.o.   MRN: [id number]  HPI: [name] is a 57 y.o.   Rash: itchy in nature , notable on legs and arms. It  started 5 days ago . Son changed detergent recently. Otherwise no new food, new medication, new clothing, parfums. She has been using Zyrtec which has improved her symptoms.  Heart fluttering: since 2 month experience that her heart is racing when she lies down. Denies any palpitation with walking or at sitting down. Patient noted she has been having a lot of stress.   Hypothyroidism: Patient reports that she has been not taking Synthroid since last 3 weeks Center was not able to get it from the [facility]. She has currently no income and is not able to pay for her medication.    Past Medical History  Diagnosis Date  . Hyperlipidemia   . Hypertension   . Hypothyroidism   . Anemia     iron deficiency anemia  . Osteoarthritis   . Sleep apnea    Current Outpatient Prescriptions  Medication Sig Dispense Refill  . acetaminophen (TYLENOL) 325 MG tablet Take 2 tablets (650 mg total) by mouth every 6 (six) hours as needed for pain.      Marland Kitchen allopurinol (ZYLOPRIM) 100 MG tablet Take 1 tablet (100 mg total) by mouth daily.  30 tablet  3  . amLODipine (NORVASC) 10 MG tablet TAKE ONE TABLET BY MOUTH DAILY  30 tablet  2  . colchicine 0.6 MG tablet Take 1 tablet (0.6 mg total) by mouth daily.  30 tablet  2  . furosemide (LASIX) 40 MG tablet Take 1 tablet (40 mg total) by mouth daily.  30 tablet  3  . levothyroxine (SYNTHROID) 125 MCG tablet Take 1 tablet (125 mcg total) by mouth daily.  30 tablet  2  . nystatin cream (MYCOSTATIN) Apply topically 2 (two) times daily. Under the breast  30 g  0  . potassium chloride (KLOR-CON) 20 MEQ packet Take 20 mEq by mouth 2 (two) times daily.  30 packet  1  . rosuvastatin (CRESTOR) 5 MG tablet Take 1 tablet (5 mg total) by mouth at bedtime.  30 tablet  3  .  traMADol (ULTRAM) 50 MG tablet Take 1 tablet (50 mg total) by mouth every 6 (six) hours as needed for pain (DO NOT TAKE WITH ALCOHOL, NO DRIVING, HEAVY MACHINERY).  40 tablet  0  . [DISCONTINUED] esomeprazole (NEXIUM) 20 MG capsule Take 2 capsules (40 mg total) by mouth daily before breakfast.  30 capsule  3  . [DISCONTINUED] KLOR-CON M20 20 MEQ tablet        No current facility-administered medications for this visit.   No family history on file. History   Social History  . Marital Status: Divorced    Spouse Name: N/A    Number of Children: N/A  . Years of Education: N/A   Social History Main Topics  . Smoking status: Former Games developer  . Smokeless tobacco: None     Comment: quit x 87yrs.  . Alcohol Use: No  . Drug Use: No  . Sexually Active: None   Other Topics Concern  . None   Social History Narrative   Lives with her son.  Perimenopausal.   Review of Systems: Constitutional: Denies fever, chills, diaphoresis, appetite change  HEENT: Denies sore throat, trouble swallowing, neck pain, neck stiffness and tinnitus.   Respiratory: Denies SOB, DOE,  cough, chest tightness,  and wheezing.   Cardiovascular: Noted chest pain and palpitations but denies leg swelling.  Gastrointestinal: Denies nausea, vomiting, abdominal pain, diarrhea, constipation, blood in stool and abdominal distention.  Genitourinary: Denies dysuria, urgency, frequency, hematuria, flank pain and difficulty urinating.  Neurological: Denies dizziness,  weakness,  numbness and headaches.    Objective:  Physical Exam: Filed Vitals:   02/29/12 1359 02/29/12 1416  BP: 161/95 144/89  Pulse: 114 113  Temp: 97.4 F (36.3 C)   TempSrc: Oral   Height: 5' (1.524 m)   Weight: 179 lb (81.194 kg)   SpO2: 98%    Constitutional: Vital signs reviewed.  Patient is a well-developed and well-nourished female in no acute distress and cooperative with exam. Alert and oriented x3.  Eyes: PERRL, EOMI, conjunctivae normal, No  scleral icterus.  Neck: Supple,  Cardiovascular: Tachycardic RR, S1 normal, S2 normal, no MRG, pulses symmetric and intact bilaterally Pulmonary/Chest: CTAB, no wheezes, rales, or rhonchi Abdominal: Soft. Non-tender, non-distended, bowel sounds are normal,  Hematology: no cervical adenopathy.  Neurological: A&O x3, Strength is normal and symmetric bilaterally,  no focal motor deficit, Skin: few excoriation noted on arms. No rashes otherwise present today.

## 2012-02-29 NOTE — Assessment & Plan Note (Signed)
I will obtain uric acid level today for possible changes in dosage. Patient reports since being on allopurinol she had not experience any current flares.

## 2012-03-01 ENCOUNTER — Encounter: Payer: Self-pay | Admitting: Internal Medicine

## 2012-03-01 LAB — BASIC METABOLIC PANEL WITH GFR
BUN: 12 mg/dL (ref 6–23)
CO2: 29 mEq/L (ref 19–32)
Calcium: 10.3 mg/dL (ref 8.4–10.5)
Creat: 0.63 mg/dL (ref 0.50–1.10)
GFR, Est African American: >89 mL/min
Glucose, Bld: 111 mg/dL — ABNORMAL HIGH (ref 70–99)
Sodium: 140 mEq/L (ref 135–145)

## 2012-03-01 LAB — LIPID PANEL
Cholesterol: 232 mg/dL — ABNORMAL HIGH (ref 0–200)
HDL: 75 mg/dL (ref >39)
LDL Cholesterol: 140 mg/dL — ABNORMAL HIGH (ref 0–99)
Total CHOL/HDL Ratio: 3.1 Ratio
Triglycerides: 83 mg/dL (ref <150)

## 2012-03-14 ENCOUNTER — Encounter: Payer: Self-pay | Admitting: Internal Medicine

## 2012-03-28 ENCOUNTER — Encounter: Payer: Self-pay | Admitting: Internal Medicine

## 2012-03-28 ENCOUNTER — Ambulatory Visit (INDEPENDENT_AMBULATORY_CARE_PROVIDER_SITE_OTHER): Payer: Self-pay | Admitting: Internal Medicine

## 2012-03-28 VITALS — BP 142/79 | HR 118 | Temp 97.2°F | Ht 60.0 in | Wt 178.1 lb

## 2012-03-28 DIAGNOSIS — R002 Palpitations: Secondary | ICD-10-CM

## 2012-03-28 DIAGNOSIS — I10 Essential (primary) hypertension: Secondary | ICD-10-CM

## 2012-03-28 DIAGNOSIS — R059 Cough, unspecified: Secondary | ICD-10-CM

## 2012-03-28 DIAGNOSIS — Z1231 Encounter for screening mammogram for malignant neoplasm of breast: Secondary | ICD-10-CM

## 2012-03-28 DIAGNOSIS — E039 Hypothyroidism, unspecified: Secondary | ICD-10-CM

## 2012-03-28 DIAGNOSIS — R05 Cough: Secondary | ICD-10-CM | POA: Insufficient documentation

## 2012-03-28 MED ORDER — AZITHROMYCIN 250 MG PO TABS: ORAL_TABLET | ORAL | Status: DC

## 2012-03-28 MED ORDER — HYDROCHLOROTHIAZIDE 25 MG PO TABS
25.0000 mg | ORAL_TABLET | Freq: Every day | ORAL | Status: DC
Start: 2012-03-28 — End: 2012-07-07

## 2012-03-28 NOTE — Progress Notes (Signed)
Subjective:   Patient ID: Lydia Walsh female   DOB: [birth year] 57 y.o.   MRN: [id number]  HPI: [name] is a 57 y.o. female with PMH significant as outliend below who presented to the clinic with productive cough with yellow sputum, congestion, fevers and chills but never took her temperature. She had been taking Alka selter plus without any significant improvement. Patient reports it started on Thursday and has been present sig getting worse. Denies any sick contact or recent travel.   Lydia Walsh: not orange card eligible     Past Medical History  Diagnosis Date  . Hyperlipidemia   . Hypertension   . Hypothyroidism   . Anemia     iron deficiency anemia  . Osteoarthritis   . Sleep apnea    Current Outpatient Prescriptions  Medication Sig Dispense Refill  . acetaminophen (TYLENOL) 325 MG tablet Take 2 tablets (650 mg total) by mouth every 6 (six) hours as needed for pain.      Marland Kitchen allopurinol (ZYLOPRIM) 100 MG tablet Take 1 tablet (100 mg total) by mouth daily.  30 tablet  3  . amLODipine (NORVASC) 10 MG tablet TAKE ONE TABLET BY MOUTH DAILY  30 tablet  2  . furosemide (LASIX) 40 MG tablet Take 1 tablet (40 mg total) by mouth daily.  30 tablet  3  . levothyroxine (SYNTHROID) 125 MCG tablet Take 1 tablet (125 mcg total) by mouth daily.  30 tablet  2  . potassium chloride (KLOR-CON) 20 MEQ packet Take 20 mEq by mouth 2 (two) times daily.  30 packet  1  . rosuvastatin (CRESTOR) 5 MG tablet Take 1 tablet (5 mg total) by mouth at bedtime.  30 tablet  3  . [DISCONTINUED] esomeprazole (NEXIUM) 20 MG capsule Take 2 capsules (40 mg total) by mouth daily before breakfast.  30 capsule  3  . [DISCONTINUED] KLOR-CON M20 20 MEQ tablet        No current facility-administered medications for this visit.   No family history on file. History   Social History  . Marital Status: Divorced    Spouse Name: N/A    Number of Children: N/A  . Years of Education: N/A   Social History Main  Topics  . Smoking status: Former Games developer  . Smokeless tobacco: None     Comment: quit x 31yrs.  . Alcohol Use: No  . Drug Use: No  . Sexually Active: None   Other Topics Concern  . None   Social History Narrative   Lives with her son.  Perimenopausal.   Review of Systems: Constitutional: Noted fever, chills, diaphoresis, appetite change and fatigue.  HEENT: Noted   congestion, sore throat, rhinorrhea, Respiratory: Noted SOB, DOE, cough, chest tightness  Cardiovascular: Denies chest pain, palpitations and leg swelling.  Gastrointestinal: Denies nausea, vomiting, abdominal pain, diarrhea, constipation, blood in stool and abdominal distention.   Skin: Denies pallor, rash and wound.  Neurological: Denies dizziness, Hematological: Denies adenopathy.   Objective:  Physical Exam: Filed Vitals:   03/28/12 1326 03/28/12 1350  BP: 150/91 142/79  Pulse: 117 118  Temp: 97.2 F (36.2 C)   TempSrc: Oral   Height: 5' (1.524 m)   Weight: 178 lb 1.6 oz (80.786 kg)   SpO2: 97%    Constitutional: Vital signs reviewed.  Patient is a well-developed and well-nourished female  in no acute distress and cooperative with exam. Alert and oriented x3.  Ear: TM normal bilaterally Mouth: no erythema or exudates, MMM  Eyes: PERRL, EOMI, conjunctivae normal, No scleral icterus.  Neck: Supple,  Cardiovascular: RRR, S1 normal, S2 normal, no MRG, pulses symmetric and intact bilaterally Pulmonary/Chest: Few rhonchi present, good airmovement no wheezing Abdominal: Soft. Non-tender, non-distended, bowel sounds are normal  Hematology: no cervical adenopathy.  Neurological: A&O x3,

## 2012-03-28 NOTE — Patient Instructions (Addendum)
Please take the fluid pill ( Furosemide ) until you get the new blood pressure medication. If your do not feel better within the next few days including worsening shortness of breath, fevers, cough  please call the clinic or go to the emergency room .

## 2012-04-06 ENCOUNTER — Other Ambulatory Visit: Payer: Self-pay | Admitting: Internal Medicine

## 2012-04-06 DIAGNOSIS — Z1231 Encounter for screening mammogram for malignant neoplasm of breast: Secondary | ICD-10-CM

## 2012-04-06 NOTE — Assessment & Plan Note (Signed)
I will obtain TSH today for possible changes in dosage.  Update: TSH is 0.5 at this point I will not make any changes in dosage.

## 2012-04-06 NOTE — Assessment & Plan Note (Signed)
I would like to obtain a chest x-ray but patient has currently no insurance and she does not want to pay for it due to financial restraints. I will empirically treat with azithromycin for possible bronchitis/pneumonia.

## 2012-04-06 NOTE — Assessment & Plan Note (Addendum)
Patient reports palpitation has resolved at this point. We'll continue to monitor appear

## 2012-04-06 NOTE — Assessment & Plan Note (Signed)
I will continue current regimen with hydrochlorothiazide 25 mg and Norvasc 10 mg. Patient is currently not taking any Lasix or potassium. I will discontinue therefore both of them.

## 2012-04-12 ENCOUNTER — Other Ambulatory Visit: Payer: Self-pay | Admitting: *Deleted

## 2012-04-12 DIAGNOSIS — E785 Hyperlipidemia, unspecified: Secondary | ICD-10-CM

## 2012-04-13 MED ORDER — ROSUVASTATIN CALCIUM 5 MG PO TABS: 5.0000 mg | ORAL_TABLET | Freq: Every day | ORAL | Status: DC

## 2012-04-13 NOTE — Telephone Encounter (Signed)
Rx called in to pharmacy. 

## 2012-04-20 ENCOUNTER — Ambulatory Visit (HOSPITAL_COMMUNITY)
Admission: RE | Admit: 2012-04-20 | Discharge: 2012-04-20 | Disposition: A | Discharge status: Home / Self Care | Payer: Self-pay | Source: Ambulatory Visit | Attending: Internal Medicine | Admitting: Internal Medicine

## 2012-04-20 ENCOUNTER — Other Ambulatory Visit: Payer: Self-pay | Admitting: Internal Medicine

## 2012-04-20 DIAGNOSIS — Z1231 Encounter for screening mammogram for malignant neoplasm of breast: Secondary | ICD-10-CM

## 2012-04-20 DIAGNOSIS — M109 Gout, unspecified: Secondary | ICD-10-CM

## 2012-05-09 ENCOUNTER — Other Ambulatory Visit: Payer: Self-pay | Admitting: *Deleted

## 2012-05-09 DIAGNOSIS — E039 Hypothyroidism, unspecified: Secondary | ICD-10-CM

## 2012-05-10 MED ORDER — LEVOTHYROXINE SODIUM 125 MCG PO TABS: 125.0000 ug | ORAL_TABLET | Freq: Every day | ORAL | Status: DC

## 2012-05-11 NOTE — Telephone Encounter (Signed)
Rx faxed in.

## 2012-05-12 ENCOUNTER — Other Ambulatory Visit: Payer: Self-pay | Admitting: Internal Medicine

## 2012-05-12 ENCOUNTER — Ambulatory Visit: Payer: Self-pay | Admitting: Internal Medicine

## 2012-05-17 ENCOUNTER — Encounter: Payer: Self-pay | Admitting: Internal Medicine

## 2012-05-23 ENCOUNTER — Encounter: Payer: Self-pay | Admitting: Internal Medicine

## 2012-05-23 ENCOUNTER — Ambulatory Visit (INDEPENDENT_AMBULATORY_CARE_PROVIDER_SITE_OTHER): Payer: Self-pay | Admitting: Internal Medicine

## 2012-05-23 VITALS — BP 140/83 | HR 109 | Temp 97.1°F | Ht 60.0 in | Wt 169.7 lb

## 2012-05-23 DIAGNOSIS — E039 Hypothyroidism, unspecified: Secondary | ICD-10-CM

## 2012-05-23 DIAGNOSIS — R002 Palpitations: Secondary | ICD-10-CM

## 2012-05-23 DIAGNOSIS — K5900 Constipation, unspecified: Secondary | ICD-10-CM | POA: Insufficient documentation

## 2012-05-23 DIAGNOSIS — I10 Essential (primary) hypertension: Secondary | ICD-10-CM

## 2012-05-23 LAB — BASIC METABOLIC PANEL WITH GFR
Chloride: 97 mEq/L (ref 96–112)
Creat: 0.54 mg/dL (ref 0.50–1.10)
GFR, Est Non African American: >89 mL/min
Potassium: 3.6 mEq/L (ref 3.5–5.3)

## 2012-05-23 LAB — TSH: TSH: 0.067 u[IU]/mL — ABNORMAL LOW (ref 0.350–4.500)

## 2012-05-23 MED ORDER — DOCUSATE SODIUM 100 MG PO CAPS: 100.0000 mg | ORAL_CAPSULE | Freq: Two times a day (BID) | ORAL | Status: DC

## 2012-05-23 NOTE — Assessment & Plan Note (Signed)
Blood pressure today is significant better controlled. We'll continue current regimen with hydrochlorothiazide 25 mg and amlodipine 10 mg daily at this point. If further workup of sinus tachycardia is negative will consider to start a beta blocker. Since patient is complaining about leg cramps I will obtain basic metabolic panel today to monitor her potassium.

## 2012-05-23 NOTE — Progress Notes (Signed)
Case discussed with Dr. Illath at the time of the visit. We reviewed the resident's history and exam and pertinent patient test results. I agree with the assessment, diagnosis and plan of care documented in the resident's note. 

## 2012-05-23 NOTE — Assessment & Plan Note (Signed)
Obtain TSH today for possible decrease of Synthroid. If this is within normal limits patient may have inappropriate sinus tachycardia which we need to treat read to be a blocker due to high risk of cardiomyopathy.

## 2012-05-23 NOTE — Assessment & Plan Note (Signed)
Will obtain TSH level today for possible changes in dosage.

## 2012-05-23 NOTE — Progress Notes (Signed)
Subjective:   Patient ID: Lydia Walsh female   DOB: 1955-08-31 57 y.o.   MRN: [id number]  HPI: [name] is a 57 y.o. female with past medical history significant as outlined below who presented to the clinic with multiple complains  1. Patient complains about left groin pain which started on Saturday. She noted constipation. Whenever she tries to have a bowel movements the pain increases. Patient denies any weakness or numbness in her legs. Denies any falls or heavy lifting.  2. Palpitation: This has been present for some time. Patient reports it occurs with walking. Just walking her dog would cause the palpitation. Denies any shortness of breath , dizziness, weakness.   3. Patient complains about leg cramps: Patient thinks it started after taking hydrochlorothiazide.    Past Medical History  Diagnosis Date  . Hyperlipidemia   . Hypertension   . Hypothyroidism   . Anemia     iron deficiency anemia  . Osteoarthritis   . Sleep apnea    Current Outpatient Prescriptions  Medication Sig Dispense Refill  . acetaminophen (TYLENOL) 325 MG tablet Take 2 tablets (650 mg total) by mouth every 6 (six) hours as needed for pain.      Marland Kitchen allopurinol (ZYLOPRIM) 100 MG tablet TAKE ONE (1) TABLET EACH DAY  30 tablet  3  . amLODipine (NORVASC) 10 MG tablet TAKE ONE TABLET BY MOUTH DAILY  30 tablet  1  . azithromycin (ZITHROMAX) 250 MG tablet Take 2 tablets today and then take one tablet for 4 more days.  6 each  0  . famotidine (PEPCID) 20 MG tablet Take 20 mg by mouth 2 (two) times daily.      . hydrochlorothiazide (HYDRODIURIL) 25 MG tablet Take 1 tablet (25 mg total) by mouth daily.  30 tablet  2  . levothyroxine (SYNTHROID) 125 MCG tablet Take 1 tablet (125 mcg total) by mouth daily.  30 tablet  2  . rosuvastatin (CRESTOR) 5 MG tablet Take 1 tablet (5 mg total) by mouth at bedtime.  30 tablet  3  . [DISCONTINUED] esomeprazole (NEXIUM) 20 MG capsule Take 2 capsules (40 mg total) by mouth  daily before breakfast.  30 capsule  3  . [DISCONTINUED] KLOR-CON M20 20 MEQ tablet        No current facility-administered medications for this visit.   No family history on file. History   Social History  . Marital Status: Divorced    Spouse Name: N/A    Number of Children: N/A  . Years of Education: N/A   Social History Main Topics  . Smoking status: Former Games developer  . Smokeless tobacco: None     Comment: quit x 15yrs.  . Alcohol Use: No  . Drug Use: No  . Sexually Active: None   Other Topics Concern  . None   Social History Narrative   Lives with her son.  Perimenopausal.   Review of Systems: Constitutional: Denies fever, chills, diaphoresis, appetite change and fatigue.   Respiratory: Denies SOB, DOE, cough, chest tightness,  and wheezing.   Cardiovascular: Denies chest pain and leg swelling.  Gastrointestinal: Denies nausea, vomiting, abdominal pain, diarrhea, blood in stool and abdominal distention.  Genitourinary: Denies dysuria, urgency, frequency, hematuria, flank pain and difficulty urinating.  Endocrine: Denies: hot or cold intolerance, sweats, changes in hair or nails, polyuria, polydipsia. Skin: Denies pallor, rash and wound.  Neurological: Denies dizziness,  weakness, light-headedness, numbness and headaches.   Objective:  Physical Exam: Filed Vitals:  05/23/12 1034 05/23/12 1051  BP: 161/92 140/83  Pulse: 108 109  Temp: 97.1 F (36.2 C)   TempSrc: Oral   Height: 5' (1.524 m)   Weight: 169 lb 11.2 oz (76.975 kg)   SpO2: 97%    Constitutional: Vital signs reviewed.  Patient is a well-developed and well-nourished  in no acute distress and cooperative with exam. Alert and oriented x3.  Eyes: PERRL, EOMI, conjunctivae normal, No scleral icterus.  Neck: Supple,  Cardiovascular: RRR, S1 normal, S2 normal, no MRG, pulses symmetric and intact bilaterally Pulmonary/Chest: CTAB, no wheezes, rales, or rhonchi Abdominal: Soft. Mild tenderness on palpation in  the left lower quadrant area without rebound,  non-distended, bowel sounds are normal, no masses.  Musculoskeletal: No joint deformities, erythema, or stiffness, ROM full and no nontender Hematology: no cervical, inginal  adenopathy.  Neurological: A&O x3, Strength is normal and symmetric bilaterally, cranial nerve II-XII are grossly intact, no focal motor deficit, sensory intact to light touch bilaterally.  Skin: Warm, dry and intact. No rash, cyanosis, or clubbing.

## 2012-05-23 NOTE — Assessment & Plan Note (Signed)
Left lower quadrant/groin pain most likely due to constipation. I will start patient on stool softener. Instructed to increase fiber intake as well as water intake. If patient continues to have pain consider further evaluation.

## 2012-05-24 ENCOUNTER — Other Ambulatory Visit: Payer: Self-pay | Admitting: Internal Medicine

## 2012-05-24 DIAGNOSIS — E039 Hypothyroidism, unspecified: Secondary | ICD-10-CM

## 2012-05-24 MED ORDER — LEVOTHYROXINE SODIUM 112 MCG PO TABS: 112.0000 ug | ORAL_TABLET | Freq: Every day | ORAL | Status: DC

## 2012-05-25 ENCOUNTER — Telehealth: Payer: Self-pay | Admitting: *Deleted

## 2012-05-25 NOTE — Telephone Encounter (Signed)
Call from pt - unable to get Synthroid from [facility]. I called [facility], stated pt needs to complete their paperwork.  Pt called and informed; stated she will give them a call b/c she thought she had completed all the paperwork.

## 2012-06-16 ENCOUNTER — Telehealth: Payer: Self-pay | Admitting: *Deleted

## 2012-06-16 ENCOUNTER — Encounter: Payer: Self-pay | Admitting: Internal Medicine

## 2012-06-16 NOTE — Telephone Encounter (Signed)
Pt called in with c/o heart fluttering since Saturday.  She states her thyroid meds are being adjusted, 5/13  Pt given appointment for tomorrow at 1100 but advised to go to ED for any SOB, Chest pain or other changes. I talked with Dr Kem Kays and he agrees. Dr Kem Kays asked that pt hold morning synthroid.

## 2012-06-17 ENCOUNTER — Encounter: Payer: Self-pay | Admitting: Internal Medicine

## 2012-06-17 ENCOUNTER — Ambulatory Visit (HOSPITAL_COMMUNITY)
Admission: RE | Admit: 2012-06-17 | Discharge: 2012-06-17 | Disposition: A | Discharge status: Home / Self Care | Payer: Self-pay | Source: Ambulatory Visit | Attending: Internal Medicine | Admitting: Internal Medicine

## 2012-06-17 ENCOUNTER — Ambulatory Visit (INDEPENDENT_AMBULATORY_CARE_PROVIDER_SITE_OTHER): Payer: Self-pay | Admitting: Internal Medicine

## 2012-06-17 VITALS — BP 134/82 | HR 95 | Temp 97.1°F | Ht 60.0 in | Wt 170.3 lb

## 2012-06-17 DIAGNOSIS — R002 Palpitations: Secondary | ICD-10-CM

## 2012-06-17 DIAGNOSIS — E039 Hypothyroidism, unspecified: Secondary | ICD-10-CM

## 2012-06-17 DIAGNOSIS — I10 Essential (primary) hypertension: Secondary | ICD-10-CM

## 2012-06-17 NOTE — Assessment & Plan Note (Signed)
BP Readings from Last 3 Encounters:  06/17/12 134/82  05/23/12 140/83  03/28/12 142/79    Lab Results  Component Value Date   NA 141 05/23/2012   K 3.6 05/23/2012   CREATININE 0.54 05/23/2012    Assessment: Blood pressure control: controlled Progress toward BP goal:  at goal Comments: The patient's BP was initially somewhat elevated, but upon recheck had normalized  Plan: Medications:  continue current medications Educational resources provided: brochure Self management tools provided: home blood pressure logbook Other plans: Recheck at next visit.

## 2012-06-17 NOTE — Progress Notes (Signed)
HPI The patient is a 57 y.o. female with a history of hypothyroidism, gout, HTN, presenting for a follow-up visit.  The patient notes a 6-day history of constead palpitations, described as a rapid heart beat.  The patient has had this problem before, including at her last visit, and this has been documented as far back as Feb 2014.  We obtained an EKG today, while the patient was feeling palpitations.  The patient notes no chest pain, SOB, LH, dizziness.  The patient has a history of hypothyroidism.  Synthroid was increased from 100 mcg/day to 125 mcg/day in Nov 2013, due to a TSH of 24.  It was then decreased to 112 mcg/day May 2014 due to palpitations, with a TSH of 0.067.  The patient has just been taking the lower dose only for the last 2 days.  The patient did not take her thyroid medication today, as instructed.  The patient notes feeling cold all the time, unintentional weight loss, constipation, and decreased appetite (which she attributes to stress).  No skin/hair/nail changes, dysphagia, odynophagia.   ROS: General: no fevers, chills Skin: no rash HEENT: no blurry vision, hearing changes, sore throat Pulm: no dyspnea, coughing, wheezing CV: no chest pain, shortness of breath Abd: no abdominal pain, nausea/vomiting, diarrhea/constipation GU: no dysuria, hematuria, polyuria Ext: no arthralgias, myalgias Neuro: no weakness, numbness, or tingling  Filed Vitals:   06/17/12 1141  BP: 134/82  Pulse:   Temp:     PEX General: alert, cooperative, and in no apparent distress HEENT: pupils equal round and reactive to light, vision grossly intact, oropharynx clear and non-erythematous  Neck: supple, no lymphadenopathy Lungs: clear to ascultation bilaterally, normal work of respiration, no wheezes, rales, ronchi Heart: regular rate and rhythm, no murmurs, gallops, or rubs Abdomen: soft, non-tender, non-distended, normal bowel sounds Extremities: no cyanosis, clubbing, or  edema Neurologic: alert & oriented X3, cranial nerves II-XII intact, strength grossly intact, sensation intact to light touch  Current Outpatient Prescriptions on File Prior to Visit  Medication Sig Dispense Refill  . acetaminophen (TYLENOL) 325 MG tablet Take 2 tablets (650 mg total) by mouth every 6 (six) hours as needed for pain.      Marland Kitchen allopurinol (ZYLOPRIM) 100 MG tablet TAKE ONE (1) TABLET EACH DAY  30 tablet  3  . amLODipine (NORVASC) 10 MG tablet TAKE ONE TABLET BY MOUTH DAILY  30 tablet  1  . docusate sodium (COLACE) 100 MG capsule Take 1 capsule (100 mg total) by mouth 2 (two) times daily.  60 capsule  1  . hydrochlorothiazide (HYDRODIURIL) 25 MG tablet Take 1 tablet (25 mg total) by mouth daily.  30 tablet  2  . levothyroxine (SYNTHROID) 112 MCG tablet Take 1 tablet (112 mcg total) by mouth daily.  30 tablet  2  . rosuvastatin (CRESTOR) 5 MG tablet Take 1 tablet (5 mg total) by mouth at bedtime.  30 tablet  3  . [DISCONTINUED] esomeprazole (NEXIUM) 20 MG capsule Take 2 capsules (40 mg total) by mouth daily before breakfast.  30 capsule  3  . [DISCONTINUED] KLOR-CON M20 20 MEQ tablet        No current facility-administered medications on file prior to visit.    EKG: predominantly NSR, with 1 PAC noted.  Borderline prolonged PR interval (204 ms).  Assessment/Plan

## 2012-06-17 NOTE — Assessment & Plan Note (Signed)
The patient has a history of hypothyroidism.  See "palpitations" above.  Of note, when the patient changed from 100 to 125 mcg, she also changed from generic levothyroxine to brand name synthroid, so the increase may have been even higher than it seemed. -continue synthroid 112 mcg -repeat TSH in 6 weeks

## 2012-06-17 NOTE — Assessment & Plan Note (Signed)
The patient notes a history of palpitations, documented as present since around the time that the patient's dose of synthroid was increased in Nov 2013.  EKG shows NSR, with 1 PAC.  Echo from 2012 shows EF 65%, normal valvular function.  I believe the etiology of the patient's palpitations is hyperthyroidism, due to an increased dose of synthroid. -continue with synthroid 112 mcg, since she has only been on this for 2 days -patient instructed to call in 2 weeks.  If still experiencing palpitations, we should likely decrease synthroid further to 100 mcg -patient to return for follow-up visit and TSH in 6 weeks

## 2012-06-17 NOTE — Patient Instructions (Signed)
General Instructions: Your heart palpitations are likely a result of your thyroid medication.  Now that your dose has been decreased to 112 mcg, I expect your symptoms to improve.  If your symptoms have not improved in 1-2 weeks, please call our office, and we may instruct you to decrease your synthroid to an even lower dose.  Please return for a follow-up visit in 6 weeks to recheck a thyroid level.   Treatment Goals:  Goals (1 Years of Data) as of 06/17/12         As of Today 05/23/12 05/23/12 03/28/12 03/28/12     Blood Pressure    . Blood Pressure < 140/90  158/90 140/83 161/92 142/79 150/91     Diet    . Reduce calorie intake to 1500  calories per day   No       . Reduce fat intake to 42 grams per day   No        Lifestyle    . Increase physical activity to 30-60 minutes a day   No  No      Result Component    . LDL CALC < 130            Progress Toward Treatment Goals:  Treatment Goal 06/17/2012  Blood pressure unchanged    Self Care Goals & Plans:  Self Care Goal 06/17/2012  Manage my medications take my medicines as prescribed; refill my medications on time  Eat healthy foods eat foods that are low in salt       Care Management & Community Referrals:  Referral 06/17/2012  Referrals made for care management support none needed

## 2012-06-20 NOTE — Progress Notes (Signed)
TEACHING ATTENDING ADDENDUM: I discussed this case with Dr. Brown at the time of the patient visit. I agree with the HPI, exam findings and have read the documentation provided by the resident,  and I concur with the plan of care. Please see the resident note for details of management.   

## 2012-06-27 ENCOUNTER — Encounter: Payer: Self-pay | Admitting: Internal Medicine

## 2012-06-28 ENCOUNTER — Encounter: Payer: Self-pay | Admitting: Internal Medicine

## 2012-06-28 ENCOUNTER — Ambulatory Visit (INDEPENDENT_AMBULATORY_CARE_PROVIDER_SITE_OTHER): Payer: Self-pay | Admitting: Internal Medicine

## 2012-06-28 VITALS — BP 154/93 | HR 115 | Temp 98.7°F | Ht 59.0 in | Wt 168.6 lb

## 2012-06-28 DIAGNOSIS — R21 Rash and other nonspecific skin eruption: Secondary | ICD-10-CM

## 2012-06-28 DIAGNOSIS — B888 Other specified infestations: Secondary | ICD-10-CM | POA: Insufficient documentation

## 2012-06-28 MED ORDER — HYDROXYZINE HCL 25 MG PO TABS: 25.0000 mg | ORAL_TABLET | Freq: Three times a day (TID) | ORAL | Status: DC | PRN

## 2012-06-28 MED ORDER — DIPHENHYDRAMINE-ZINC ACETATE 2-0.1 % EX CREA: TOPICAL_CREAM | Freq: Three times a day (TID) | CUTANEOUS | Status: DC | PRN

## 2012-06-28 NOTE — Patient Instructions (Addendum)
General Instructions: Your rash is likely due to bedbugs (see attached information).  You will need to dispose of your bed and box spring, and inform your landlord.  You will also likely need to have your apartment treated with pesticide by an exterminator. -we are prescribing hydoxyzine tablets, or benadryl cream (whichever is more affordable for you).  Use three times per day as needed for itching.  Please return for your regularly scheduled appointment.   Treatment Goals:  Goals (1 Years of Data) as of 06/28/12         As of Today 06/17/12 06/17/12 05/23/12 05/23/12     Blood Pressure    . Blood Pressure < 140/90  154/93 134/82 158/90 140/83 161/92     Diet    . Reduce calorie intake to 1500  calories per day     No     . Reduce fat intake to 42 grams per day     No      Lifestyle    . Increase physical activity to 30-60 minutes a day     No      Result Component    . LDL CALC < 130            Progress Toward Treatment Goals:  Treatment Goal 06/28/2012  Blood pressure deteriorated    Self Care Goals & Plans:  Self Care Goal 06/28/2012  Manage my medications take my medicines as prescribed; bring my medications to every visit  Monitor my health keep track of my blood glucose  Eat healthy foods eat foods that are low in salt; eat baked foods instead of fried foods       Care Management & Community Referrals:  Referral 06/28/2012  Referrals made for care management support none needed

## 2012-06-28 NOTE — Progress Notes (Signed)
HPI The patient is a 57 y.o. female with a history of hypothyroidism, gout, OSA, HTN, presenting for an acute visit for a rash.  The patient notes a 4-day history of a rash.  She notes that 4 days ago, she awoke with several erythematous, intensely pruritic papules.  Since then, every morning she awakens with several more similar papules.  She notes 1 dog in the house, which is an indoor-outdoor pet.  No one else in the house has similar symptoms.  No one else shares her bed.  She has never had bedbugs.  She notes no fevers, chills, change in appetite, arthralgias.  ROS: General: no fevers, chills, changes in weight, changes in appetite Skin: see HPI HEENT: no blurry vision, hearing changes, sore throat Pulm: no dyspnea, coughing, wheezing CV: no chest pain, palpitations, shortness of breath Abd: no abdominal pain, nausea/vomiting, diarrhea/constipation GU: no dysuria, hematuria, polyuria Ext: no arthralgias, myalgias Neuro: no weakness, numbness, or tingling  Filed Vitals:   06/28/12 1520  BP: 154/93  Pulse: 115  Temp: 98.7 F (37.1 C)    PEX General: alert, cooperative, and in no apparent distress Skin: Multiple large, erythematous papules, widely spaced on her arms, legs, back, and face, approximately 20 lesions in total HEENT: pupils equal round and reactive to light, vision grossly intact, oropharynx clear and non-erythematous  Neck: supple, no lymphadenopathy Lungs: clear to ascultation bilaterally, normal work of respiration, no wheezes, rales, ronchi Heart: regular rate and rhythm, no murmurs, gallops, or rubs Abdomen: soft, non-tender, non-distended, normal bowel sounds Extremities: no cyanosis, clubbing, or edema Neurologic: alert & oriented X3, cranial nerves II-XII intact, strength grossly intact, sensation intact to light touch  Current Outpatient Prescriptions on File Prior to Visit  Medication Sig Dispense Refill  . acetaminophen (TYLENOL) 325 MG tablet Take 2  tablets (650 mg total) by mouth every 6 (six) hours as needed for pain.      Marland Kitchen allopurinol (ZYLOPRIM) 100 MG tablet TAKE ONE (1) TABLET EACH DAY  30 tablet  3  . amLODipine (NORVASC) 10 MG tablet TAKE ONE TABLET BY MOUTH DAILY  30 tablet  1  . docusate sodium (COLACE) 100 MG capsule Take 1 capsule (100 mg total) by mouth 2 (two) times daily.  60 capsule  1  . hydrochlorothiazide (HYDRODIURIL) 25 MG tablet Take 1 tablet (25 mg total) by mouth daily.  30 tablet  2  . levothyroxine (SYNTHROID) 112 MCG tablet Take 1 tablet (112 mcg total) by mouth daily.  30 tablet  2  . rosuvastatin (CRESTOR) 5 MG tablet Take 1 tablet (5 mg total) by mouth at bedtime.  30 tablet  3  . [DISCONTINUED] esomeprazole (NEXIUM) 20 MG capsule Take 2 capsules (40 mg total) by mouth daily before breakfast.  30 capsule  3  . [DISCONTINUED] KLOR-CON M20 20 MEQ tablet        No current facility-administered medications on file prior to visit.    Assessment/Plan

## 2012-06-28 NOTE — Assessment & Plan Note (Signed)
The patient presents with multiple pruritic papules, consistent with bed bug bites.  The source is likely the patient's mattress.  Lack of symptoms in her sons who share her house (but not her bed) indicates that the infection may not have spread from her bed.  No symptoms of fever or joint aches to support a systemic disease causing her rash. -throw away bed, inform landlord, apartment will likely need chemical insecticide -use PO hydroxyzine or benadryl cream for symptoms of itching -follow-up in 1 month, to ensure resolution

## 2012-06-29 NOTE — Progress Notes (Signed)
Case discussed with Dr. Manson Passey; we also saw the patient together and I confirmed the physical exam.  We reviewed the resident's history and exam and pertinent patient test results.  I agree with the assessment, diagnosis and plan of care documented in the resident's note.

## 2012-07-05 ENCOUNTER — Other Ambulatory Visit: Payer: Self-pay | Admitting: *Deleted

## 2012-07-05 DIAGNOSIS — E039 Hypothyroidism, unspecified: Secondary | ICD-10-CM

## 2012-07-06 ENCOUNTER — Encounter: Payer: Self-pay | Admitting: Internal Medicine

## 2012-07-06 ENCOUNTER — Other Ambulatory Visit: Payer: Self-pay | Admitting: Internal Medicine

## 2012-07-06 DIAGNOSIS — E039 Hypothyroidism, unspecified: Secondary | ICD-10-CM

## 2012-07-06 MED ORDER — LEVOTHYROXINE SODIUM 100 MCG PO TABS: 100.0000 ug | ORAL_TABLET | Freq: Every day | ORAL | Status: DC

## 2012-07-06 MED ORDER — LEVOTHYROXINE SODIUM 112 MCG PO TABS: 112.0000 ug | ORAL_TABLET | Freq: Every day | ORAL | Status: DC

## 2012-07-07 ENCOUNTER — Telehealth: Payer: Self-pay | Admitting: *Deleted

## 2012-07-07 NOTE — Telephone Encounter (Signed)
Pt called clinic with c/o allergic reaction from HCTZ.  She states she started on this med 2 - 3 weeks ago and she has rash on mouth and face, red and raised. She stopped hctz and rash is gone.  Now she is back to normal and feels good.   She does not want to restart the HCTZ. Also she states [employer] came out to the house and said she did not have bed bugs, only a few flees.  She wants the complaint removed from her med list. Pt # [phone] Please advise

## 2012-07-07 NOTE — Telephone Encounter (Signed)
Called to pharm 

## 2012-07-07 NOTE — Telephone Encounter (Signed)
I'm glad she was able to recognize the signs of a reaction, and stopped the HCTZ.  I've added this to her allergy list.  I still believe her lesions were due to bed bugs, but I'm happy to remove the problem from her list if it's distressing to the patient.  Thank you.

## 2012-07-19 ENCOUNTER — Other Ambulatory Visit: Payer: Self-pay | Admitting: *Deleted

## 2012-07-19 DIAGNOSIS — E785 Hyperlipidemia, unspecified: Secondary | ICD-10-CM

## 2012-07-19 MED ORDER — ROSUVASTATIN CALCIUM 5 MG PO TABS
5.0000 mg | ORAL_TABLET | Freq: Every day | ORAL | Status: DC
Start: 2012-07-19 — End: 2012-08-19

## 2012-07-19 MED ORDER — ROSUVASTATIN CALCIUM 5 MG PO TABS: 5.0000 mg | ORAL_TABLET | Freq: Every day | ORAL | Status: DC

## 2012-07-19 NOTE — Telephone Encounter (Signed)
Rx called in to pharmacy. 

## 2012-07-19 NOTE — Telephone Encounter (Signed)
I sent in 5 mg of Crestor instead of 20 bc that was what she had been getting since about 2013. No explanation why went from 20 to 5 in 2013. Has appt on 18th with PCP.

## 2012-07-20 ENCOUNTER — Other Ambulatory Visit: Payer: Self-pay | Admitting: Internal Medicine

## 2012-07-26 ENCOUNTER — Ambulatory Visit: Payer: Self-pay

## 2012-07-29 ENCOUNTER — Ambulatory Visit (INDEPENDENT_AMBULATORY_CARE_PROVIDER_SITE_OTHER): Payer: No Typology Code available for payment source | Admitting: Internal Medicine

## 2012-07-29 ENCOUNTER — Encounter: Payer: Self-pay | Admitting: Internal Medicine

## 2012-07-29 VITALS — BP 146/93 | HR 83 | Temp 97.0°F | Ht 59.0 in | Wt 166.5 lb

## 2012-07-29 DIAGNOSIS — I10 Essential (primary) hypertension: Secondary | ICD-10-CM

## 2012-07-29 DIAGNOSIS — E039 Hypothyroidism, unspecified: Secondary | ICD-10-CM

## 2012-07-29 DIAGNOSIS — M1990 Unspecified osteoarthritis, unspecified site: Secondary | ICD-10-CM

## 2012-07-29 LAB — BASIC METABOLIC PANEL WITH GFR
CO2: 26 mEq/L (ref 19–32)
Calcium: 9.9 mg/dL (ref 8.4–10.5)
GFR, Est African American: >89 mL/min
Sodium: 142 mEq/L (ref 135–145)

## 2012-07-29 LAB — TSH: TSH: 1.455 u[IU]/mL (ref 0.350–4.500)

## 2012-07-29 MED ORDER — FUROSEMIDE 20 MG PO TABS: 20.0000 mg | ORAL_TABLET | Freq: Every day | ORAL | Status: DC

## 2012-07-29 NOTE — Assessment & Plan Note (Addendum)
Patient's history and exam are consistent with chronic OA pain. Prior x-rays have confirmed arthritic changes in her knees. Her last steroid injection was in December 2013.  - Referral to sports medicine for steroid injections and physical therapy - Pain management with NSAIDs

## 2012-07-29 NOTE — Assessment & Plan Note (Addendum)
BP Readings from Last 3 Encounters:  07/29/12 146/93  06/28/12 154/93  06/17/12 134/82    Lab Results  Component Value Date   NA 141 05/23/2012   K 3.6 05/23/2012   CREATININE 0.54 05/23/2012    Assessment: Blood pressure control: mildly elevated Progress toward BP goal:  deteriorated  Plan: Medications:  Add Lasix 20mg  daily Educational resources provided: brochure Self management tools provided:  (States she had already received info.) Other plans: Continue Amlodipine 10mg  daily and add Lasix 20mg  daily. Patient is allergic to HCTZ and ACE-I's. She also has some mild hypercalcemia (10.7) on prior BMP, so I think a low dose of Lasix is a good choice. Checking BMP today. Follow up in 2-3 weeks.  ADDENDUM: BMET WNL    Component Value Date/Time   NA 142 07/29/2012 1157   K 3.7 07/29/2012 1157   CL 103 07/29/2012 1157   CO2 26 07/29/2012 1157   GLUCOSE 94 07/29/2012 1157   BUN 11 07/29/2012 1157   CREATININE 0.58 07/29/2012 1157   CREATININE 0.81 04/26/2009 0225   CALCIUM 9.9 07/29/2012 1157   GFRNONAA >60 07/20/2008 1214   GFRAA  Value: >60        The eGFR has been calculated using the MDRD equation. This calculation has not been validated in all clinical situations. eGFR's persistently <60 mL/min signify possible Chronic Kidney Disease. 07/20/2008 1214

## 2012-07-29 NOTE — Progress Notes (Signed)
  Subjective:    Patient ID: Lydia Walsh, female    DOB: January 30, 1955, 57 y.o.   MRN: [id number]  HPI Lydia Walsh is a 57 y.o. female with a history of hypothyroidism, gout, OSA, HTN, HLD, presenting for follow up.  She was last seen by Dr. Manson Passey in 06/28/12 for a rash believed to represent bed bug bites. She since noted a relationship between the bumps and taking her HCTZ, so she discontinued the medication and the rash has resolved. She is also allergic to Ace-I and notes environmental allergies to shellfish, cats, dust mites, and a certain brand of wheat cracker.  Today she complains of left knee pain. She describes it as a constant, achy pain worse with pressure and activity. It is worse at the anterior joint lines. The pain feels similar to OA pain she has had in the past. Some mild swelling but no associated redness, warmth, fevers, chills, numbness. She takes Aleve at home for the pain. She has previously gotten steroid injections in her knees which helped with her arthritis pain tremendously. Last injections were in our office on 12/30/2011. She is interested in a Sport Medicine referral for both injections and PT.  She has a history of hypothyroidism. She denies any heart palpitations, skin or hair changes, rash. She is due for a recheck of her TSH.  No recent gout flares, on Allopurinol 100mg  daily. She previously got gout in her left big toe. No history of gout in her knees.  BP today is 150/89 with repeat 146/93.  Her last colonoscopy was 7 years ago per patient report.    Review of Systems  Constitutional: Negative for fever and chills.  HENT: Negative for congestion, sore throat and rhinorrhea.   Eyes: Negative for visual disturbance.  Respiratory: Negative for cough and shortness of breath.   Cardiovascular: Negative for chest pain and palpitations.  Gastrointestinal: Negative for nausea, vomiting, abdominal pain and diarrhea.  Endocrine: Negative for polyuria.   Genitourinary: Negative for dysuria.  Musculoskeletal: Negative for myalgias and joint swelling.  Skin: Negative for rash.  Neurological: Negative for dizziness, light-headedness, numbness and headaches.       Objective:   Physical Exam  Constitutional: She is oriented to person, place, and time. She appears well-developed and well-nourished.  Cardiovascular: Normal rate, regular rhythm, normal heart sounds and intact distal pulses.  Exam reveals no gallop and no friction rub.   No murmur heard. Pulmonary/Chest: Effort normal and breath sounds normal. No respiratory distress. She has no wheezes. She has no rales. She exhibits no tenderness.  Musculoskeletal: Normal range of motion. She exhibits no edema.       Left knee: She exhibits normal range of motion, no swelling, no effusion, no ecchymosis, no deformity and no erythema. Tenderness (Joint space tenderness, esp. anterior) found.  Crepitus in BL knees.  Neurological: She is alert and oriented to person, place, and time. She has normal reflexes. She displays no Babinski's sign on the right side. She displays no Babinski's sign on the left side.  Skin: Skin is warm and dry. No rash noted.          Assessment & Plan:

## 2012-07-29 NOTE — Patient Instructions (Addendum)
Thanks for your visit. - We have referred you to Sports Medicine for management of your knee pain including steroid injections and physical therapy. There is no co-pay. - Since your blood pressure is high, we are adding a small dose of Lasix 20mg  daily. Please call the office if you have an allergic reaction to this medicine or cannot tolerate it for any reason. It is a diuretic/water pill and may make you urinate more. - Return visit in 2-3 weeks for recheck of your blood pressure and review of your lab work.

## 2012-07-29 NOTE — Assessment & Plan Note (Addendum)
Stable on Synthroid . - Checking TSH.  ADDENDUM: TSH WNL (1.455)

## 2012-08-02 NOTE — Progress Notes (Signed)
I saw and evaluated the patient.  I personally confirmed the key portions of the history and exam documented by Dr. Cater and I reviewed pertinent patient test results.  The assessment, diagnosis, and plan were formulated together and I agree with the documentation in the resident's note. 

## 2012-08-09 ENCOUNTER — Ambulatory Visit (INDEPENDENT_AMBULATORY_CARE_PROVIDER_SITE_OTHER): Payer: No Typology Code available for payment source | Admitting: Family Medicine

## 2012-08-09 ENCOUNTER — Encounter: Payer: Self-pay | Admitting: Family Medicine

## 2012-08-09 VITALS — BP 134/83 | Ht 60.0 in | Wt 160.0 lb

## 2012-08-09 DIAGNOSIS — G8929 Other chronic pain: Secondary | ICD-10-CM

## 2012-08-09 DIAGNOSIS — M1712 Unilateral primary osteoarthritis, left knee: Secondary | ICD-10-CM

## 2012-08-09 DIAGNOSIS — IMO0002 Reserved for concepts with insufficient information to code with codable children: Secondary | ICD-10-CM

## 2012-08-09 DIAGNOSIS — M1710 Unilateral primary osteoarthritis, unspecified knee: Secondary | ICD-10-CM

## 2012-08-09 DIAGNOSIS — M25562 Pain in left knee: Secondary | ICD-10-CM

## 2012-08-09 DIAGNOSIS — M25569 Pain in unspecified knee: Secondary | ICD-10-CM

## 2012-08-09 MED ORDER — METHYLPREDNISOLONE ACETATE 40 MG/ML IJ SUSP
40.0000 mg | Freq: Once | INTRAMUSCULAR | Status: AC
  Administered 2012-08-09: 40 mg via INTRA_ARTICULAR

## 2012-08-09 NOTE — Patient Instructions (Signed)
You have knee pain from osteoarthritis. 1. We will do a steroid injection today 2. Start physical therapy 3. Take 2 Aleve 2 x per day with food 4. Consider getting an OTC compression sleeve 5. Continue ice, icy hot, aspercreme

## 2012-08-09 NOTE — Progress Notes (Signed)
CC: Left knee pain HPI: Patient is a very pleasant 57 year old female who presents for left knee pain that is chronic in nature. She has had left knee pain for multiple years and had a left knee arthroscopy in 2007 by Dr. Luiz Blare. She states that he found bone-on-bone arthritis at that time and also had meniscal debridement. She states that she feels that her knee pain has been worsening since that surgery. It has gradually over time gotten more severe. Pain is in the anterior aspect of the knee and is a stabbing pain. It comes and goes and is worse when walking downstairs or walking her dog. She also has significant pain after she has been sitting for while and tries to get out. She does occasionally have swelling and warmth of the knee. She is using icy hot, heat, and Vicodin for pain. She completed physical therapy about 5 years ago which did help her with her pain. She endorses some giving out of the knee but has not fallen and has a lot of popping of the knee. No locking or catching. She has had cortisone injections into the knee in the past which has given her about a month of relief each time. Last injection was over a year ago. She is not currently taking any supplements for her arthritis pain.  ROS: As above in the HPI. All other systems are stable or negative.  PMH: Includes hypothyroidism, dyslipidemia, gout, obstructive sleep apnea, arthritis, high blood pressure  PSH: Bilateral knee arthroscopy by Dr. Luiz Blare in 2007  Social: Patient does not smoke. She quit about a decade ago. She does not drink alcohol. She is currently unemployed and looking for a new job. Family: Family history is positive for high blood pressure her mother. Negative for diabetes and heart disease.  Allergies: No known drug allergies    OBJECTIVE: APPEARANCE:  Patient in no acute distress.The patient appeared well nourished and normally developed. HEENT: No scleral icterus. Conjunctiva non-injected Resp: Non  labored Skin: No rash MSK:  Left Knee - Inspection normal with no erythema or effusion or obvious bony abnormalities.  - Palpation shows both medial and lateral joint line tenderness. No TTP at patellar or quad tendon.  - Patient has loss of complete extension by about 10. Flexion is limited to about 100 due to pain. - Strength 5/5 in flexion and extension. - Ligaments with solid consistent endpoints including ACL, PCL, LCL, MCL.  - Positive Mcmurray's.  - Neurovascularly intact  MSK [place]: Ultrasound of the left knee was performed with both transverse and longitudinal views. Patellar tendon and quad tendon visualized and are intact without any hypoechoic areas. There is a mild hypoechoic fluid collection in the suprapatellar pouch suggestive of small knee effusion. Medial and lateral joint lines show joint space narrowing and jagged hyperechoic bone suggestive of osteophytes. Meniscus unable to be visualized   ASSESSMENT: #1. Left knee pain due to severe osteoarthritis. Patient's x-rays from 2013 were reviewed and show severe arthritis. She has physical exam and ultrasound findings also suggestive of this diagnosis.   PLAN: Ms. Houser has had improvement with corticosteroid injection and physical therapy in the past. Therefore we will start with these modalities. She is referred to physical therapy today. I've also asked her to take 2 Aleve twice a day with food. She can try compression, ice, topical medications like icy hot or Aspercreme for her symptoms. She has received a interarticular knee injection at clinic today. She will followup with me in one month.  If she had only temporary improvement at that time may consider Viscosupplementation. I strongly suspect that patient is headed towards total knee replacement in the future.  Left knee injection: Consent obtained and verified.  Time-out conducted.  Noted no overlying erythema, induration, or other signs of local infection.  Skin  prepped in a sterile fashion.  Topical analgesic spray: Ethyl chloride.  Joint: Left Knee with anteromedial approach Needle: 25 gauge 1 1/2 inch Completed without difficulty.  Meds: 3 cc 1% lidocaine, 1 cc depomedrol Advised to call if fevers/chills, erythema, induration, drainage, or persistent bleeding.

## 2012-08-17 ENCOUNTER — Ambulatory Visit: Payer: No Typology Code available for payment source | Attending: Orthopedic Surgery | Admitting: Physical Therapy

## 2012-08-17 DIAGNOSIS — M6281 Muscle weakness (generalized): Secondary | ICD-10-CM | POA: Insufficient documentation

## 2012-08-17 DIAGNOSIS — M25569 Pain in unspecified knee: Secondary | ICD-10-CM | POA: Insufficient documentation

## 2012-08-17 DIAGNOSIS — M25469 Effusion, unspecified knee: Secondary | ICD-10-CM | POA: Insufficient documentation

## 2012-08-17 DIAGNOSIS — IMO0001 Reserved for inherently not codable concepts without codable children: Secondary | ICD-10-CM | POA: Insufficient documentation

## 2012-08-19 ENCOUNTER — Ambulatory Visit (INDEPENDENT_AMBULATORY_CARE_PROVIDER_SITE_OTHER): Payer: No Typology Code available for payment source | Admitting: Internal Medicine

## 2012-08-19 ENCOUNTER — Encounter: Payer: Self-pay | Admitting: Internal Medicine

## 2012-08-19 VITALS — BP 140/72 | HR 92 | Temp 97.0°F | Ht 60.5 in | Wt 167.7 lb

## 2012-08-19 DIAGNOSIS — Z0000 Encounter for general adult medical examination without abnormal findings: Secondary | ICD-10-CM | POA: Insufficient documentation

## 2012-08-19 DIAGNOSIS — E785 Hyperlipidemia, unspecified: Secondary | ICD-10-CM

## 2012-08-19 DIAGNOSIS — Z1231 Encounter for screening mammogram for malignant neoplasm of breast: Secondary | ICD-10-CM | POA: Insufficient documentation

## 2012-08-19 DIAGNOSIS — M1990 Unspecified osteoarthritis, unspecified site: Secondary | ICD-10-CM

## 2012-08-19 DIAGNOSIS — I10 Essential (primary) hypertension: Secondary | ICD-10-CM

## 2012-08-19 DIAGNOSIS — M109 Gout, unspecified: Secondary | ICD-10-CM

## 2012-08-19 MED ORDER — ROSUVASTATIN CALCIUM 5 MG PO TABS: 5.0000 mg | ORAL_TABLET | Freq: Every day | ORAL | Status: DC

## 2012-08-19 MED ORDER — AMLODIPINE BESYLATE 10 MG PO TABS: ORAL_TABLET | ORAL | Status: DC

## 2012-08-19 MED ORDER — ALLOPURINOL 100 MG PO TABS: ORAL_TABLET | ORAL | Status: DC

## 2012-08-19 MED ORDER — FUROSEMIDE 20 MG PO TABS: 20.0000 mg | ORAL_TABLET | Freq: Every day | ORAL | Status: DC

## 2012-08-19 NOTE — Progress Notes (Signed)
Patient ID: Lydia Walsh, female   DOB: Jul 05, 1955, 57 y.o.   MRN: [id number]            HPI: Lydia Walsh is a 57 y.o. AAF here for a f/u BP recheck since she was started on lasix 20mg  on her last visit of 7/18. Her BP when she came into the clinic was 143/92, upon resting a few minutes I rechecked it and it came down to 140/72.  She admits to taking the furosemide at night and has difficulty sleeping due to waking up having to urinate frequently.  I suggested that she take the lasix in the morning.  She admits to feeling more fatigue in the past few months.  She does not check her BP at home but says she recently got a BP cuff and will begin checking this twice daily.  Of note, she had an appointment with sports medicine on 7/29 for osteoarthritis of the left knee.  She was advised during that visit to take 2 aleve tablets twice daily and try ice/Aspercreme for pain relief.  She had a steroid injection in the left knee during this visit based on improvement with this modality in the past.  She was also set up to begin physical therapy which she will start next week.     Past Medical History  Diagnosis Date  . Hyperlipidemia   . Hypertension   . Hypothyroidism   . Anemia     iron deficiency anemia  . Osteoarthritis   . Sleep apnea    Current Outpatient Prescriptions  Medication Sig Dispense Refill  . allopurinol (ZYLOPRIM) 100 MG tablet TAKE ONE (1) TABLET EACH DAY  30 tablet  3  . amLODipine (NORVASC) 10 MG tablet TAKE 1 TABLET BY MOUTH DAILY  30 tablet  2  . docusate sodium (COLACE) 100 MG capsule Take 1 capsule (100 mg total) by mouth 2 (two) times daily.  60 capsule  1  . furosemide (LASIX) 20 MG tablet Take 1 tablet (20 mg total) by mouth daily.  30 tablet  3  . levothyroxine (SYNTHROID, LEVOTHROID) 112 MCG tablet Take 1 tablet (112 mcg total) by mouth daily.  30 tablet  1  . rosuvastatin (CRESTOR) 5 MG tablet Take 1 tablet (5 mg total) by mouth at bedtime.  30 tablet  3  .  acetaminophen (TYLENOL) 325 MG tablet Take 2 tablets (650 mg total) by mouth every 6 (six) hours as needed for pain.      . diphenhydrAMINE-zinc acetate (BENADRYL EXTRA STRENGTH) cream Apply topically 3 (three) times daily as needed for itching.  30 g  1  . [DISCONTINUED] esomeprazole (NEXIUM) 20 MG capsule Take 2 capsules (40 mg total) by mouth daily before breakfast.  30 capsule  3  . [DISCONTINUED] KLOR-CON M20 20 MEQ tablet        No current facility-administered medications for this visit.   No family history on file. History   Social History  . Marital Status: Divorced    Spouse Name: N/A    Number of Children: N/A  . Years of Education: N/A   Social History Main Topics  . Smoking status: Former Games developer  . Smokeless tobacco: None     Comment: quit x 48yrs.  . Alcohol Use: No  . Drug Use: No  . Sexually Active: None   Other Topics Concern  . None   Social History Narrative   Lives with her son.  Perimenopausal.    Review of Systems:  Constitutional: Denies fever, chills, diaphoresis, appetite change.   Respiratory: Denies SOB, DOE, cough, chest tightness, and wheezing.  Cardiovascular: No chest pain, palpitations and leg swelling.  Gastrointestinal: No abdominal pain, nausea, vomiting, bloody stools Genitourinary: No dysuria, frequency, hematuria, or flank pain.    Objective:  Physical Exam: Filed Vitals:   08/19/12 0954 08/19/12 1039  BP: 143/92 140/72  Pulse: 92   Temp: 97 F (36.1 C)   TempSrc: Oral   Height: 5' 0.5" (1.537 m)   Weight: 167 lb 11.2 oz (76.068 kg)   SpO2: 100%    General: Well nourished. No acute distress.  Lungs: CTA bilaterally. Heart: RRR; no extra sounds or murmurs  Abdomen: Non-distended, normal BS, soft, nontender; no hepatosplenomegaly  Extremities: No pedal edema. No joint swelling or tenderness. Neurologic: Alert and oriented x3. No obvious neurologic deficits.  Assessment & Plan:  I have discussed my assessment and plan with  Dr. Meredith Pel  as detailed under problem based charting.

## 2012-08-19 NOTE — Assessment & Plan Note (Addendum)
Pt states she had a normal pap smear a couple of years ago and denied ever having any abnormal pap results.  However, she could not remember where she had this done.  She also reports having a normal colonoscopy 5 years ago at [facility] but I am not able to find any record of this.    -requested pt locate records of her previous pap smear and colonoscopy and provide [place] with this additional information

## 2012-08-19 NOTE — Patient Instructions (Addendum)
Please return to clinic in 2-4 weeks for a f/u with your PCP Dr. Claudell Kyle  1.  BP today: 140/72; continue furosemide-it may be helpful to take this in the morning as to not interrupt your sleep 2. We are rechecking your electrolytes to make sure your potassium level is normal 3. Please take your BP at home twice daily; we will give you a log to record your values 4. I will reorder your allopurinol, norvasc, lasix, and crestor 5. Please find your records for your most recent pap smear and colonoscopy and provide [place] with this information

## 2012-08-19 NOTE — Assessment & Plan Note (Signed)
Pt had an appt with sports medicine on 7/29 for osteoarthritis of the left knee.  She was advised during that visit to take 2 aleve tablets twice daily and try ice/Aspercreme for pain relief.  She had a steroid injection in the left knee during this visit based on improvement with this modality in the past.  She was also set up to begin physical therapy which she will start next week.  It is thought that she will need left knee replacement in the near future.  -continue PT and recommendations from sports medicine -possible left knee replacement

## 2012-08-19 NOTE — Progress Notes (Signed)
I saw and evaluated the patient.  I personally confirmed the key portions of the history and exam documented by Dr. Gill and I reviewed pertinent patient test results.  The assessment, diagnosis, and plan were formulated together and I agree with the documentation in the resident's note. 

## 2012-08-19 NOTE — Assessment & Plan Note (Signed)
BP Readings from Last 3 Encounters:  08/19/12 140/72  08/09/12 134/83  07/29/12 146/93    Lab Results  Component Value Date   NA 142 07/29/2012   K 3.7 07/29/2012   CREATININE 0.58 07/29/2012    Assessment: Blood pressure control:  not controlled Progress toward BP goal:   not at goal Comments: BP today: 140/72; no side effects noted from lasix  Plan: Medications:  continue current medications Educational resources provided: brochure Self management tools provided: home blood pressure logbook Other plans: Requested pt check her BP twice daily and keep a log of her BP values to bring on next visit; will recheck BMET today and if potassium is low, she will need replacement

## 2012-08-20 LAB — BASIC METABOLIC PANEL WITH GFR
CO2: 27 mEq/L (ref 19–32)
Calcium: 9.8 mg/dL (ref 8.4–10.5)
GFR, Est African American: >89 mL/min
GFR, Est Non African American: >89 mL/min
Sodium: 139 mEq/L (ref 135–145)

## 2012-08-21 ENCOUNTER — Encounter: Payer: Self-pay | Admitting: Internal Medicine

## 2012-08-22 ENCOUNTER — Telehealth: Payer: Self-pay | Admitting: *Deleted

## 2012-08-22 ENCOUNTER — Ambulatory Visit: Payer: No Typology Code available for payment source | Admitting: Physical Therapy

## 2012-08-22 NOTE — Telephone Encounter (Signed)
Pt calls and states that she needs some hydrocodone for her knee pain, she states she has taken in the past and it helped

## 2012-08-24 ENCOUNTER — Ambulatory Visit: Payer: No Typology Code available for payment source | Admitting: Physical Therapy

## 2012-08-24 ENCOUNTER — Telehealth: Payer: Self-pay | Admitting: Internal Medicine

## 2012-08-24 NOTE — Telephone Encounter (Signed)
  Reason for call:   I placed an outgoing call to [name] at 7:40  PM regarding pain control for her knee. She called the clinic earlier this week requesting hydrocodone, and I wanted to discuss her problems with her before prescribing this controlled substance.   Assessment/ Plan:   Left message that I will try calling her again tomorrow night.  As always, pt is advised that if symptoms worsen or new symptoms arise, they should go to an urgent care facility or to to ER for further evaluation.   Lydia Barrack, MD   08/24/2012, 7:40 PM

## 2012-08-29 ENCOUNTER — Encounter: Payer: No Typology Code available for payment source | Admitting: Physical Therapy

## 2012-08-31 ENCOUNTER — Encounter: Payer: No Typology Code available for payment source | Admitting: Physical Therapy

## 2012-08-31 ENCOUNTER — Ambulatory Visit: Payer: No Typology Code available for payment source | Admitting: Physical Therapy

## 2012-09-01 ENCOUNTER — Ambulatory Visit: Payer: No Typology Code available for payment source | Admitting: Physical Therapy

## 2012-09-05 ENCOUNTER — Ambulatory Visit: Payer: No Typology Code available for payment source | Admitting: Internal Medicine

## 2012-09-05 ENCOUNTER — Ambulatory Visit: Payer: No Typology Code available for payment source | Admitting: Physical Therapy

## 2012-09-07 ENCOUNTER — Ambulatory Visit (INDEPENDENT_AMBULATORY_CARE_PROVIDER_SITE_OTHER): Payer: No Typology Code available for payment source | Admitting: Internal Medicine

## 2012-09-07 ENCOUNTER — Encounter: Payer: Self-pay | Admitting: Internal Medicine

## 2012-09-07 ENCOUNTER — Ambulatory Visit: Payer: No Typology Code available for payment source | Admitting: Physical Therapy

## 2012-09-07 VITALS — BP 132/87 | HR 89 | Temp 96.9°F | Ht 60.0 in | Wt 169.8 lb

## 2012-09-07 DIAGNOSIS — R05 Cough: Secondary | ICD-10-CM

## 2012-09-07 DIAGNOSIS — N951 Menopausal and female climacteric states: Secondary | ICD-10-CM

## 2012-09-07 DIAGNOSIS — R059 Cough, unspecified: Secondary | ICD-10-CM

## 2012-09-07 DIAGNOSIS — Z0000 Encounter for general adult medical examination without abnormal findings: Secondary | ICD-10-CM

## 2012-09-07 DIAGNOSIS — I10 Essential (primary) hypertension: Secondary | ICD-10-CM

## 2012-09-07 DIAGNOSIS — E039 Hypothyroidism, unspecified: Secondary | ICD-10-CM

## 2012-09-07 DIAGNOSIS — M1990 Unspecified osteoarthritis, unspecified site: Secondary | ICD-10-CM

## 2012-09-07 NOTE — Progress Notes (Signed)
Subjective:     Patient ID: Lydia Walsh, female   DOB: Jun 15, 1955, 57 y.o.   MRN: [id number]  HPI  57 year old african [place] female here for follow up of hypertension and knee pain. Her past blood pressures have been mildly elevated, however she has been doing well on Amlodipine 10 mg and furosemide 20 mg. She has started taking lasix in the morning as suggested to her during the last visit, and feels improvement in her nocturia. She complains of knee pain, secondary to osteoarthritis, and she had to stop her PT sessions because she was in tremendous pain, and she will revisit sports medicine for this. She also complains of hot flashes, which have increased over the past years. Her other complaint is cough, which has lingered on since spring. She endorses having seasonal allergies, however she attributes the cough to allopurinol.  Review of Systems  Constitutional: Negative for fever, chills, activity change and fatigue.  HENT: Negative for ear pain, nosebleeds, sore throat, facial swelling, rhinorrhea, sneezing, neck pain, neck stiffness, dental problem, voice change, postnasal drip, sinus pressure and ear discharge.   Respiratory: Positive for cough. Negative for choking, chest tightness, shortness of breath, wheezing and stridor.   Cardiovascular: Negative for chest pain, palpitations and leg swelling.  Endocrine: Positive for polyuria.  Genitourinary: Negative for dysuria and flank pain.  Musculoskeletal: Positive for arthralgias (chronic bilateral knee pain - osteoarthritis). Negative for myalgias and joint swelling.  Skin: Negative for rash.  Neurological: Negative for dizziness and weakness.  Psychiatric/Behavioral: Negative for sleep disturbance.     Medications:   Acetaminophen 325 mg, 650 mg Q6H PRN  Allopurinol 100 mg, 1 tablet daily  Amlodipine 10 mg, 1 tablet daily  Docusates 100 mg, 1 tablet Q12  Furosemide 20 mg, 1 tablet daily  Levothyroxine 112 mcg, 1 tablet  daily  Rosuvastatin 5 mg, 1 tablet daily     Objective:   Physical Exam General: No acute distress.  HEENT: PERRL, EOMI.   Pharynx: Mild hyperemia of the posterior pharyngeal wall. No tonsillar enlargement, no lymphadenopathy, no exudates.  CV: S1S2 RRR, systolic murmur 2/6.  Lungs: Bilateral Vesicular breath sounds.  Pedal Edema: None Pedal pulses present.  Neuro: Alert and oriented times 3. No gross deficits seen.      Assessment & Plan:      Hypertension - Well controlled on Amlodipine and Lasix. Continue current regimen.  Osteoarthritis knee - The patient will follow up with sports medicine about this. Currently under control with acetaminophen and NSAIDs.   Hot flashes, post menopausal - Referral given to women's clinic for further evaluation.  Hypothyroidism - Last TSH check in July WNL. Will continue with 112 mcg of synthroid. Does not complain of symptoms related to uncontrolled hypothyroidism.  Cough - Seems secondary to seasonal allergies, which probably started in spring/summer and has lasted till now. Explained to patient that cough is not a typical side effect of allopurinol. She expressed understanding. She has been advised to take OTC benadryl for cough and see if that helps.   Preventive - We will obtain colonoscopy records from [place]. The patient will follow up for PAP smear with the women's clinic. She declined the flu vaccine today.   RTC in 3 months.

## 2012-09-07 NOTE — Patient Instructions (Addendum)
It was a pleasure to meet you today! - Your blood pressure is well controlled. Continue your Lasix as prescribed. - We will refer you to the [facility] at [facility] for evaluation and treatment of your hot flashes and for future pap smears.   - You may use benadryl over the counter to improve your dry throat and cough. It may make you drowsy so you should take these at night before bed.  - Follow up with your sports medicine doctor at your scheduled appointment for evaluation and continued treatment of your knee pain.

## 2012-09-07 NOTE — Progress Notes (Signed)
Subjective:     Patient ID: Lydia Walsh, female   DOB: 10-26-1955, 57 y.o.   MRN: [id number]  HPI: Lydia Walsh is 57 yo female with PMH significant for HTN, OSA, OA, GERD, hypothyroidism and dyslipidemia who presents for follow up regarding her hypertension. Upon presentation today her BP was 141/87, p= 83. She reports having walked from the parking lot immediately prior. A repeat BP was obtained and was 132/87. She reports that has started taking her furosemide in the AM and it has helped her get a better night sleep without having to get up multiple times to void. She did not take her medication today because she didn't want to stop while driving to her appointment.   Lydia. Walsh also inquires about her labs drawn on 8/8. She is specifically concerned with her potassium level. K was found to 4.1 and WNL.   Lydia. Walsh also reports that her left knee has continued to bother her. She reports that her right knee is now somewhat tender that she believes to be due to her putting her weight on the right knee mostly. She was seen by sports medicine who administered steroid injections, advised to use aspecreme/Aleive and hot/cold compresses and referred her to physical therapy with plan for likely knee replacement in the near future. She reports going to sports medicine for 4 sessions but stopped because the pain was to intense. She rates her pain today 8/10 with mild resolution from aleve and APAP that brings her pain to 6/10. She is planned to see planned to see Dr. Neomia Dear on 9/9 with sports medicine for repeat injections and evaluation of physical therapy.    Review of Systems  Constitutional: Negative.   HENT: Negative.   Eyes: Negative.   Respiratory: Positive for cough.        Dry cough thought to be related to dry throat. Patient reports improvements with water.   Cardiovascular: Negative.   Gastrointestinal: Positive for constipation.       Patient is taking Colace that has improved her symptoms.     Endocrine: Positive for heat intolerance.       Patient reports worsening hot flashes sense start of menopause roughly 5 years ago.    Genitourinary: Positive for frequency.       Increased following Lasix patient has started taking in AM that has reduced her nocturia and she reports improvement in her sleep.    Musculoskeletal: Negative.   Skin: Negative.        Bruising and soreness of left arm s/p hitting arm in closed 3 days ago. Patient reports the area is continuing to improve.   Allergic/Immunologic: Negative.   Neurological: Negative.   Hematological: Bruises/bleeds easily.       Bruising and soreness of left arm s/p hitting arm in closed 3 days ago. Patient reports the area is continuing to improve.   Psychiatric/Behavioral: Negative.        Objective:   Physical Exam  Constitutional: She is oriented to person, place, and time. She appears well-developed and well-nourished.  HENT:  Head: Normocephalic and atraumatic.  Eyes: Conjunctivae and EOM are normal. Pupils are equal, round, and reactive to light.  Neck: Normal range of motion. Neck supple.  Cardiovascular: Normal rate and regular rhythm.   Murmur heard. Pulmonary/Chest: Effort normal and breath sounds normal.  Abdominal: Soft. Bowel sounds are normal.  Musculoskeletal: She exhibits tenderness.  Tenderness to palpation in left patellar area.    Neurological: She is alert and  oriented to person, place, and time. She has normal reflexes.       Assessment/Plan:     1. Hypertension: Patient blood pressure is below target goal and well controlled. No changes at this time. Patient advised to continue taking Lasix in the morning to avoid nocturia.   2. Cough: Patient has developed cough that appears to be associated with seasonal allergies. Recommend OTC diphenhydramine prn for relief. Patient warned of side effects including drowsiness and advised to take before bed, which is when she reports the cough to be  worse.  3. Hot Flashes: Patient hot flashes have continue to worsen since starting menopause. She will be referred to [facility] for evaluation and treatment. She was advised to to speak with them regarding routine pap smears in the future due to her last reported pap smear 1-2 years ago.  4. Knee pain- Patient is scheduled to see her sports medicine physician on 9/9. We will defer treatment at this time as she has been followed by their clinic over the last month. Knee replacement is likely to be required in the future per sports medicine.   5. Health Maintenance: Influenza vaccination offered today, but patient declines. She reports that she may elect to have it in the future but not today. Attempted to located records of past colonoscopy as he reports having one completed 5 years ago with unremarkable findings, but I could not find the record. Plan to contact [facility] to enquire and obtain records.

## 2012-09-15 ENCOUNTER — Other Ambulatory Visit: Payer: Self-pay | Admitting: *Deleted

## 2012-09-15 DIAGNOSIS — E039 Hypothyroidism, unspecified: Secondary | ICD-10-CM

## 2012-09-15 MED ORDER — LEVOTHYROXINE SODIUM 112 MCG PO TABS: 112.0000 ug | ORAL_TABLET | Freq: Every day | ORAL | Status: DC

## 2012-09-15 NOTE — Telephone Encounter (Signed)
Rx called in for # 90 per dr Montez Morita

## 2012-09-19 ENCOUNTER — Encounter: Payer: Self-pay | Admitting: Obstetrics and Gynecology

## 2012-09-20 ENCOUNTER — Ambulatory Visit (INDEPENDENT_AMBULATORY_CARE_PROVIDER_SITE_OTHER): Payer: No Typology Code available for payment source | Admitting: Family Medicine

## 2012-09-20 VITALS — BP 153/89 | Ht 60.0 in | Wt 160.0 lb

## 2012-09-20 DIAGNOSIS — M25561 Pain in right knee: Secondary | ICD-10-CM

## 2012-09-20 DIAGNOSIS — M25569 Pain in unspecified knee: Secondary | ICD-10-CM

## 2012-09-20 DIAGNOSIS — M1712 Unilateral primary osteoarthritis, left knee: Secondary | ICD-10-CM

## 2012-09-20 DIAGNOSIS — IMO0002 Reserved for concepts with insufficient information to code with codable children: Secondary | ICD-10-CM

## 2012-09-20 DIAGNOSIS — M1710 Unilateral primary osteoarthritis, unspecified knee: Secondary | ICD-10-CM

## 2012-09-20 DIAGNOSIS — M1711 Unilateral primary osteoarthritis, right knee: Secondary | ICD-10-CM

## 2012-09-20 MED ORDER — TRAMADOL HCL 50 MG PO TABS: 50.0000 mg | ORAL_TABLET | Freq: Three times a day (TID) | ORAL | Status: DC | PRN

## 2012-09-20 MED ORDER — MELOXICAM 15 MG PO TABS: 15.0000 mg | ORAL_TABLET | Freq: Every day | ORAL | Status: DC

## 2012-09-20 MED ORDER — METHYLPREDNISOLONE ACETATE 40 MG/ML IJ SUSP
40.0000 mg | Freq: Once | INTRAMUSCULAR | Status: AC
  Administered 2012-09-20: 40 mg via INTRA_ARTICULAR

## 2012-09-20 NOTE — Progress Notes (Signed)
CC: Followup left knee pain, new right knee pain HPI: Patient is a very pleasant 57 year old female who presents for followup of left knee pain with new complaint of right knee pain. Patient has known bilateral osteoarthritis of the knees, left greater than right.  She has previously had bilateral knee arthroscopies with Dr. Luiz Blare. At her last visit patient had symptoms consistent with flare of her osteoarthritis and she received a corticosteroid injection into her left knee. She says that this helped her tremendously for about one week but then her symptoms returned. She did go to physical therapy sessions but this made her pain is significantly worse so she stopped going. She continues to complain of some catching and popping in her left knee. She has a lot of difficulty walking up and down stairs as well as walking her dog.  She states that her right knee started bothering her when she was in therapy. She describes pain in the anterior aspect of the knee that is similar to her left knee pain. She has a lot of pain in her bilateral knees at night. No mechanical symptoms in the right knee. She is currently taking Aleve for her pain. Unfortunately she does not have any insurance at this time.  ROS: As above in the HPI. All other systems are stable or negative.  OBJECTIVE: APPEARANCE:  Patient in no acute distress.The patient appeared well nourished and normally developed. HEENT: No scleral icterus. Conjunctiva non-injected Resp: Non labored Skin: No rash MSK:  Right Knee - Inspection normal with no erythema or effusion or obvious bony abnormalities.  - Severe tenderness to palpation over the right medial joint line. No TTP at patellar or quad tendon.  - ROM normal in flexion and extension. Pain with endrange flexion - Strength 5/5 in flexion and extension. - Ligaments with solid consistent endpoints including ACL, LCL, MCL.  - McMurray's creates pain.  - Neurovascularly intact    ASSESSMENT: #1. Bilateral osteoarthritis of the knee, left greater than right   PLAN: Unfortunately, patient only had one week of pain relief from the cortisone injection into her left knee. I do think that she would be a good candidate for a trial of Supartz injection. Unfortunately, she only has the orange part at this time so we will have to check on whether we can get a supply of Supartz for her. In the meantime, we will treat her with meloxicam 15 mg daily as well as tramadol as needed. She was cautioned not to take other NSAIDs while on the meloxicam. For her right knee, it seems she has a flare of osteoarthritis as a result of increased demand while performing physical therapy. We will go ahead with a corticosteroid injection and her right knee today. She was encouraged to engage in nonweightbearing exercise such as stationary bicycle or swimming. She was also advised to try topical capsaicin. She will followup in one month. We will work on [employer] approval for her.  Right knee injection: Consent obtained and verified.  Time-out conducted.  Noted no overlying erythema, induration, or other signs of local infection.  Skin prepped in a sterile fashion.  Topical analgesic spray: Ethyl chloride.  Joint: Right knee Needle: 22-gauge 1-1/2 inch Completed without difficulty.  Meds: 4 cc 1% lidocaine, 1 cc depomedrol Advised to call if fevers/chills, erythema, induration, drainage, or persistent bleeding.

## 2012-09-20 NOTE — Patient Instructions (Signed)
Thank you for coming in today  For both knees, please work on staying active. I recommend stationary bicycle or water aerobics. Ice your knees 2-3 x per day Take meloxicam 15 mg daily for 1 week then as needed Use tramadol as needed for pain Try over the counter topical capsaicin cream (Capsin) We will call you and let you know if the [employer] is approved for your left knee.

## 2012-10-04 ENCOUNTER — Ambulatory Visit (INDEPENDENT_AMBULATORY_CARE_PROVIDER_SITE_OTHER): Payer: No Typology Code available for payment source | Admitting: Family Medicine

## 2012-10-04 VITALS — BP 148/90 | Ht 60.0 in | Wt 160.0 lb

## 2012-10-04 DIAGNOSIS — M25569 Pain in unspecified knee: Secondary | ICD-10-CM

## 2012-10-04 DIAGNOSIS — M25561 Pain in right knee: Secondary | ICD-10-CM

## 2012-10-04 DIAGNOSIS — G8929 Other chronic pain: Secondary | ICD-10-CM

## 2012-10-04 MED ORDER — SODIUM HYALURONATE (VISCOSUP) 25 MG/2.5ML IX SOLN
25.0000 mg | Freq: Once | INTRA_ARTICULAR | Status: AC
  Administered 2012-10-04: 25 mg via INTRA_ARTICULAR

## 2012-10-04 NOTE — Progress Notes (Signed)
CC: Followup bilateral knee pain due to osteoarthritis HPI: Patient presents for followup today. She states that her left knee continues to give her a lot of pain. She notices that acute stroke at night and she will get sharp pains during the night. She states that her right knee is feeling better since we injected it with cortisone last visit. She still does have some pain in the right knee. She is here for Supartz today. She would like to try in her left knee first.  ROS: As above in the HPI. All other systems are stable or negative.  OBJECTIVE: APPEARANCE:  Patient in no acute distress.The patient appeared well nourished and normally developed. HEENT: No scleral icterus. Conjunctiva non-injected Resp: Non labored Skin: No rash MSK:  Left Knee - Inspection normal with no erythema or effusion or obvious bony abnormalities.  - Palpation with medial joint line tenderness. No TTP at patellar or quad tendon.  - ROM normal in flexion and extension. - Strength 5/5 in flexion and extension. - Neurovascularly intact   ASSESSMENT: #1. Left knee pain due to osteoarthritis, Supartz injection #1 today. Recommended continue meloxicam. Also may try Tylenol Extra Strength 1000 mg 3x per day and topical capsaicin cream.  PLAN: Supartz #1 injection into left knee today. Followup in one week for second injection. Consent obtained and verified.  Time-out conducted.  Noted no overlying erythema, induration, or other signs of local infection.  Skin prepped in a sterile fashion.  Topical analgesic spray: Ethyl chloride.  Joint: Left knee Needle: 22-gauge 1-1/2 inch Completed without difficulty.  Meds: Supartz Advised to call if fevers/chills, erythema, induration, drainage, or persistent bleeding.

## 2012-10-04 NOTE — Patient Instructions (Addendum)
We injected Supartz into your left knee today  1. Take it easy for the next 2-3 days 2. Ice 2x per day 3. Try extra strength tylenol 2 tablets 3x per day 4. Try topical capsaicin cream (Capsin)

## 2012-10-10 ENCOUNTER — Other Ambulatory Visit: Payer: Self-pay | Admitting: *Deleted

## 2012-10-10 DIAGNOSIS — E039 Hypothyroidism, unspecified: Secondary | ICD-10-CM

## 2012-10-10 NOTE — Telephone Encounter (Signed)
Fax from [facility] - requesting 90 day supply  THANKS

## 2012-10-11 ENCOUNTER — Ambulatory Visit: Payer: No Typology Code available for payment source | Admitting: Family Medicine

## 2012-10-11 MED ORDER — LEVOTHYROXINE SODIUM 112 MCG PO TABS: 112.0000 ug | ORAL_TABLET | Freq: Every day | ORAL | Status: DC

## 2012-10-11 NOTE — Telephone Encounter (Signed)
Synthroid rx called to [facility].

## 2012-10-18 ENCOUNTER — Ambulatory Visit (INDEPENDENT_AMBULATORY_CARE_PROVIDER_SITE_OTHER): Payer: No Typology Code available for payment source | Admitting: Family Medicine

## 2012-10-18 ENCOUNTER — Encounter: Payer: Self-pay | Admitting: Family Medicine

## 2012-10-18 VITALS — BP 142/80 | Ht 60.0 in | Wt 160.0 lb

## 2012-10-18 DIAGNOSIS — M1710 Unilateral primary osteoarthritis, unspecified knee: Secondary | ICD-10-CM

## 2012-10-18 DIAGNOSIS — M1712 Unilateral primary osteoarthritis, left knee: Secondary | ICD-10-CM

## 2012-10-18 DIAGNOSIS — IMO0002 Reserved for concepts with insufficient information to code with codable children: Secondary | ICD-10-CM

## 2012-10-18 DIAGNOSIS — M1711 Unilateral primary osteoarthritis, right knee: Secondary | ICD-10-CM

## 2012-10-18 MED ORDER — SODIUM HYALURONATE (VISCOSUP) 25 MG/2.5ML IX SOLN
2.5000 mL | Freq: Once | INTRA_ARTICULAR | Status: AC
  Administered 2012-10-18: 2.5 mL via INTRA_ARTICULAR

## 2012-10-18 MED ORDER — NAPROXEN 500 MG PO TABS: 500.0000 mg | ORAL_TABLET | Freq: Two times a day (BID) | ORAL | Status: DC | PRN

## 2012-10-18 NOTE — Patient Instructions (Signed)
Thank you for coming in today  Call if fever, knee swelling or redness or warmth, or increased pain  Ice 2 x per day   Take aleve 2 tablets 2 x per day  Followup 1 week

## 2012-10-18 NOTE — Progress Notes (Signed)
CC: Supartz #2 left knee, Supartz #1 right knee HPI: Patient presents for Supartz injections today. Her right knee is bothering her more and she desires injection today. Her left knee temporally felt better but is really bothering her again.  ROS: As above in the HPI. All other systems are stable or negative.  OBJECTIVE: APPEARANCE:  Patient in no acute distress.The patient appeared well nourished and normally developed. HEENT: No scleral icterus. Conjunctiva non-injected Resp: Non labored Skin: No rash MSK:  Right knee: No effusion. No sign of infection. Full range of motion. Tenderness over joint line Left knee: No effusion. No sign of infection. Full range of motion. Tenderness over joint line.   ASSESSMENT: #1. Bilateral osteoarthritis of the knees   PLAN: #1. Supartz #2 in left knee #2. Supartz #1 in right knee   Supartz #2 injection into left knee today. Followup in one week for next injection.  Consent obtained and verified.  Time-out conducted.  Noted no overlying erythema, induration, or other signs of local infection.  Skin prepped in a sterile fashion.  Topical analgesic spray: Ethyl chloride.  Joint: Left knee  Needle: 22-gauge 1-1/2 inch  Completed without difficulty.  Meds: Supartz  Advised to call if fevers/chills, erythema, induration, drainage, or persistent bleeding.  Supartz #1 injection into right knee today. Followup in one week for second injection.  Consent obtained and verified.  Time-out conducted.  Noted no overlying erythema, induration, or other signs of local infection.  Skin prepped in a sterile fashion.  Topical analgesic spray: Ethyl chloride.  Joint: Left knee  Needle: 22-gauge 1-1/2 inch  Completed without difficulty.  Meds: Supartz  Advised to call if fevers/chills, erythema, induration, drainage, or persistent bleeding.

## 2012-10-25 ENCOUNTER — Ambulatory Visit (INDEPENDENT_AMBULATORY_CARE_PROVIDER_SITE_OTHER): Payer: No Typology Code available for payment source | Admitting: Family Medicine

## 2012-10-25 ENCOUNTER — Encounter: Payer: Self-pay | Admitting: Family Medicine

## 2012-10-25 VITALS — BP 130/84 | HR 90 | Ht 60.0 in | Wt 160.0 lb

## 2012-10-25 DIAGNOSIS — M1990 Unspecified osteoarthritis, unspecified site: Secondary | ICD-10-CM

## 2012-10-25 MED ORDER — SODIUM HYALURONATE (VISCOSUP) 25 MG/2.5ML IX SOLN
25.0000 mg | Freq: Once | INTRA_ARTICULAR | Status: AC
  Administered 2012-10-25: 25 mg via INTRA_ARTICULAR

## 2012-10-25 NOTE — Progress Notes (Signed)
CC: Supartz #3 left knee HPI: Patient presents for Supartz #3 injection of her left knee today. She states her right knee is feeling excellent and she does not desire any injections into that knee today. She does feel like her left knee has improved some. Her son has even mentioned to her that she is walking more normally.  ROS: As above in the HPI. All other systems are stable or negative.  OBJECTIVE: APPEARANCE:  Patient in no acute distress.The patient appeared well nourished and normally developed. HEENT: No scleral icterus. Conjunctiva non-injected Resp: Non labored Skin: No rash MSK: Full range of motion. No effusion.   ASSESSMENT: #1. Bilateral osteoarthritis of the knees #2. Left knee Supartz #3   PLAN:  #1. Supartz #3 in left knee   Supartz #3 injection into left knee today. Followup in one week for next injection.  Consent obtained and verified.  Time-out conducted.  Noted no overlying erythema, induration, or other signs of local infection.  Skin prepped in a sterile fashion.  Topical analgesic spray: Ethyl chloride.  Joint: Left knee  Needle: 22-gauge 1-1/2 inch  Completed without difficulty.  Meds: Supartz  Advised to call if fevers/chills, erythema, induration, drainage, or persistent bleeding.

## 2012-11-01 ENCOUNTER — Ambulatory Visit (INDEPENDENT_AMBULATORY_CARE_PROVIDER_SITE_OTHER): Payer: No Typology Code available for payment source | Admitting: Family Medicine

## 2012-11-01 ENCOUNTER — Encounter: Payer: Self-pay | Admitting: Family Medicine

## 2012-11-01 VITALS — BP 128/81 | Ht 60.0 in | Wt 160.0 lb

## 2012-11-01 DIAGNOSIS — IMO0002 Reserved for concepts with insufficient information to code with codable children: Secondary | ICD-10-CM

## 2012-11-01 DIAGNOSIS — M1712 Unilateral primary osteoarthritis, left knee: Secondary | ICD-10-CM

## 2012-11-01 DIAGNOSIS — M1710 Unilateral primary osteoarthritis, unspecified knee: Secondary | ICD-10-CM

## 2012-11-01 MED ORDER — SODIUM HYALURONATE (VISCOSUP) 25 MG/2.5ML IX SOSY
2.5000 mL | PREFILLED_SYRINGE | Freq: Once | INTRA_ARTICULAR | Status: AC
  Administered 2012-11-01: 2.5 mL via INTRA_ARTICULAR

## 2012-11-01 NOTE — Progress Notes (Signed)
CC: Supartz #4 left knee   HPI:  Patient presents for Supartz #4 injection of her left knee today. Her left knee continues to improve. She can do more with less pain  ROS:  As above in the HPI.  All other systems are stable or negative.  OBJECTIVE:  APPEARANCE:  Patient in no acute distress.The patient appeared well nourished and normally developed.  HEENT: No scleral icterus. Conjunctiva non-injected  Resp: Non labored  Skin: No rash  MSK: Full range of motion. No effusion.   ASSESSMENT:  #1. Bilateral osteoarthritis of the knees  #2. Left knee Supartz #4   PLAN:  #1. Supartz #4 in left knee   Supartz #4 injection into left knee today. Followup in one week for next injection.  Consent obtained and verified.  Time-out conducted.  Noted no overlying erythema, induration, or other signs of local infection.  Skin prepped in a sterile fashion.  Topical analgesic spray: Ethyl chloride.  Joint: Left knee  Needle: 22-gauge 1-1/2 inch  Completed without difficulty.  Meds: Supartz  Advised to call if fevers/chills, erythema, induration, drainage, or persistent bleeding.

## 2012-11-07 ENCOUNTER — Encounter: Payer: No Typology Code available for payment source | Admitting: Obstetrics and Gynecology

## 2012-11-08 ENCOUNTER — Ambulatory Visit: Payer: No Typology Code available for payment source | Admitting: Family Medicine

## 2012-11-11 NOTE — Addendum Note (Signed)
Addended by: Neomia Dear on: 11/11/2012 04:46 PM   Modules accepted: Orders

## 2012-11-15 ENCOUNTER — Encounter: Payer: Self-pay | Admitting: Family Medicine

## 2012-11-15 ENCOUNTER — Ambulatory Visit (INDEPENDENT_AMBULATORY_CARE_PROVIDER_SITE_OTHER): Payer: No Typology Code available for payment source | Admitting: Family Medicine

## 2012-11-15 VITALS — BP 143/85 | Ht 60.0 in | Wt 160.0 lb

## 2012-11-15 DIAGNOSIS — M1710 Unilateral primary osteoarthritis, unspecified knee: Secondary | ICD-10-CM

## 2012-11-15 DIAGNOSIS — M1712 Unilateral primary osteoarthritis, left knee: Secondary | ICD-10-CM

## 2012-11-15 DIAGNOSIS — IMO0002 Reserved for concepts with insufficient information to code with codable children: Secondary | ICD-10-CM

## 2012-11-15 MED ORDER — SODIUM HYALURONATE (VISCOSUP) 25 MG/2.5ML IX SOSY
2.5000 mL | PREFILLED_SYRINGE | Freq: Once | INTRA_ARTICULAR | Status: AC
  Administered 2012-11-15: 2.5 mL via INTRA_ARTICULAR

## 2012-11-15 NOTE — Progress Notes (Signed)
CC: Supartz #5 left knee   HPI:  Patient presents for Supartz #5 injection of her left knee today. She continues to improve. She is looking for a new job. Her knee is much less painful. It still does catch at some times.  ROS:  As above in the HPI.  All other systems are stable or negative.   OBJECTIVE:  APPEARANCE:  Patient in no acute distress.The patient appeared well nourished and normally developed.  HEENT: No scleral icterus. Conjunctiva non-injected  Resp: Non labored  Skin: No rash  MSK: Full range of motion. No effusion.   ASSESSMENT:  #1. Bilateral osteoarthritis of the knees  #2. Left knee Supartz #5   PLAN:  #1. Supartz #5 in left knee . Followup as needed.  Supartz #5 injection into left knee today.   Consent obtained and verified.  Time-out conducted.  Noted no overlying erythema, induration, or other signs of local infection.  Skin prepped in a sterile fashion.  Topical analgesic spray: Ethyl chloride.  Joint: Left knee  Needle: 22-gauge 1-1/2 inch  Completed without difficulty.  Meds: Supartz  Advised to call if fevers/chills, erythema, induration, drainage, or persistent bleeding.

## 2012-12-02 ENCOUNTER — Other Ambulatory Visit: Payer: Self-pay | Admitting: Internal Medicine

## 2012-12-21 ENCOUNTER — Encounter: Payer: No Typology Code available for payment source | Admitting: Internal Medicine

## 2012-12-26 ENCOUNTER — Encounter: Payer: Self-pay | Admitting: Internal Medicine

## 2012-12-26 ENCOUNTER — Ambulatory Visit (INDEPENDENT_AMBULATORY_CARE_PROVIDER_SITE_OTHER): Payer: No Typology Code available for payment source | Admitting: Internal Medicine

## 2012-12-26 ENCOUNTER — Other Ambulatory Visit: Payer: Self-pay | Admitting: Internal Medicine

## 2012-12-26 VITALS — BP 159/87 | HR 71 | Temp 97.0°F | Ht 60.0 in | Wt 177.1 lb

## 2012-12-26 DIAGNOSIS — I10 Essential (primary) hypertension: Secondary | ICD-10-CM

## 2012-12-26 DIAGNOSIS — E039 Hypothyroidism, unspecified: Secondary | ICD-10-CM

## 2012-12-26 DIAGNOSIS — M109 Gout, unspecified: Secondary | ICD-10-CM

## 2012-12-26 DIAGNOSIS — M1990 Unspecified osteoarthritis, unspecified site: Secondary | ICD-10-CM

## 2012-12-26 LAB — TSH: TSH: 2.839 u[IU]/mL (ref 0.350–4.500)

## 2012-12-26 MED ORDER — TRAMADOL HCL 50 MG PO TABS: 50.0000 mg | ORAL_TABLET | Freq: Three times a day (TID) | ORAL | Status: DC | PRN

## 2012-12-26 MED ORDER — IBUPROFEN 600 MG PO TABS: 600.0000 mg | ORAL_TABLET | Freq: Three times a day (TID) | ORAL | Status: DC | PRN

## 2012-12-26 NOTE — Patient Instructions (Signed)
-  Please take ibuprofen and tramadol as needed for knee pain -Will refer you to Sports medicine for f/u appt with Dr. Neomia Dear -Will check your TSH today

## 2012-12-28 NOTE — Assessment & Plan Note (Addendum)
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    Plan:  -Continue allopurinol 100 mg daily  -Continue to monitor

## 2012-12-28 NOTE — Progress Notes (Signed)
Patient ID: Lydia Walsh, female   DOB: 07-26-1955, 57 y.o.   MRN: [id number]   Subjective:   Patient ID: Lydia Walsh female   DOB: January 20, 1955 57 y.o.   MRN: [id number]  HPI: Lydia Walsh is a 57 y.o. very pleasant woman with past medical history of hypertension, hyperlipidemia, hypothyroidism (on replacement),  gout, osteoarthritis, iron-deficiency anemia, and OSA who presents with chief complaint of left knee pain.  Pt reports that she has chronic bilateral OA with left knee pain worse than right knee pain. The pain is 10/10 and worse with walking after about 5-10 minutes and climbing stairs and relieved with rest and sitting. She denies significant swelling or overlying erythema or warmth . She denies fever, chills, weakness, or paraesthesias. She was recently referred to Sports Medicine where she saw Dr. Neomia Dear and had 5 corticosteroid injections into her left knee beginning 09/20/12 with last injection on 11/15/12. Her last XR of left knee in October 2014 revealed moderate tricompartmental degenerative changes, most prominent in the medial and patellofemoral compartments. She reports significant relief after the injections but return of the pain the end of November that is worse than her baseline pain. She has used naproxen which seems to have made it worse. She also has been using multiple OTC medications including alieve, ibuprofen, and tylenol. Hot/cold compresses and aspercreme have also not provided relief. She repots that she used a short course of  vicodin some time ago with significant relief.  She has not been using a cane to ambulate or attended PT therapy recently. She denies any recent falls or injuries but does report her left knee giving out a few times. She is hoping to have bilateral knee replacement in the upcoming year once she is able to obtain insurance.   She has been compliant with urate acid lowering therapy with allopurinol (100 mg daily) and denies any recent flare-ups.    She has history of hypothroidism and is complaint with levothyroxine 112 mcg daily. She feels cold at baseline with dry skin, with recent weight gain, and constipation at times.    Past Medical History  Diagnosis Date  . Hyperlipidemia   . Hypertension   . Hypothyroidism   . Anemia     iron deficiency anemia  . Osteoarthritis   . Sleep apnea    Current Outpatient Prescriptions  Medication Sig Dispense Refill  . acetaminophen (TYLENOL) 325 MG tablet Take 2 tablets (650 mg total) by mouth every 6 (six) hours as needed for pain.      Marland Kitchen allopurinol (ZYLOPRIM) 100 MG tablet TAKE ONE TABLET BY MOUTH ONCE DAILY  30 tablet  11  . amLODipine (NORVASC) 10 MG tablet TAKE 1 TABLET BY MOUTH DAILY  30 tablet  11  . diphenhydrAMINE-zinc acetate (BENADRYL EXTRA STRENGTH) cream Apply topically 3 (three) times daily as needed for itching.  30 g  1  . docusate sodium (COLACE) 100 MG capsule Take 1 capsule (100 mg total) by mouth 2 (two) times daily.  60 capsule  1  . furosemide (LASIX) 20 MG tablet Take 1 tablet (20 mg total) by mouth daily.  30 tablet  3  . ibuprofen (ADVIL,MOTRIN) 600 MG tablet Take 1 tablet (600 mg total) by mouth every 8 (eight) hours as needed.  90 tablet  0  . levothyroxine (SYNTHROID, LEVOTHROID) 112 MCG tablet Take 1 tablet (112 mcg total) by mouth daily.  90 tablet  3  . meloxicam (MOBIC) 15 MG tablet Take 1 tablet (  15 mg total) by mouth daily.  30 tablet  0  . naproxen (NAPROSYN) 500 MG tablet Take 1 tablet (500 mg total) by mouth 2 (two) times daily as needed.  60 tablet  2  . rosuvastatin (CRESTOR) 5 MG tablet Take 1 tablet (5 mg total) by mouth at bedtime.  30 tablet  3  . traMADol (ULTRAM) 50 MG tablet Take 1 tablet (50 mg total) by mouth every 8 (eight) hours as needed for pain.  50 tablet  0  . traMADol (ULTRAM) 50 MG tablet Take 1 tablet (50 mg total) by mouth every 8 (eight) hours as needed.  90 tablet  0  . [DISCONTINUED] esomeprazole (NEXIUM) 20 MG capsule Take 2  capsules (40 mg total) by mouth daily before breakfast.  30 capsule  3  . [DISCONTINUED] KLOR-CON M20 20 MEQ tablet        No current facility-administered medications for this visit.   No family history on file. History   Social History  . Marital Status: Divorced    Spouse Name: N/A    Number of Children: N/A  . Years of Education: N/A   Social History Main Topics  . Smoking status: Former Games developer  . Smokeless tobacco: None     Comment: quit x 38yrs.  . Alcohol Use: No  . Drug Use: No  . Sexual Activity: None   Other Topics Concern  . None   Social History Narrative   Lives with her son.  Perimenopausal.   Review of Systems: Review of Systems  Constitutional: Negative for fever, chills, weight loss, malaise/fatigue and diaphoresis.  HENT: Positive for congestion (due to allergic rhinitis). Negative for sore throat.   Eyes: Negative for blurred vision.  Respiratory: Negative for cough and shortness of breath.   Cardiovascular: Positive for leg swelling (L>R). Negative for chest pain and palpitations.  Gastrointestinal: Positive for constipation. Negative for nausea, vomiting, abdominal pain, diarrhea and blood in stool.  Genitourinary: Negative for dysuria, urgency and hematuria.  Musculoskeletal: Positive for joint pain. Negative for back pain, falls, myalgias and neck pain.  Skin: Negative for rash.       Chronically dry  Neurological: Negative for dizziness, sensory change, weakness and headaches.  Endo/Heme/Allergies:       Chronic cold extremities    Objective:  Physical Exam: Filed Vitals:   12/26/12 1325 12/26/12 1636  BP: 150/81 159/87  Pulse: 99 71  Temp: 97 F (36.1 C)   TempSrc: Oral   Height: 5' (1.524 m)   Weight: 177 lb 1.6 oz (80.332 kg)   SpO2: 100%     Physical Exam  Constitutional: She is oriented to person, place, and time. She appears well-developed and well-nourished. No distress.  HENT:  Head: Normocephalic and atraumatic.  Right  Ear: External ear normal.  Left Ear: External ear normal.  Nose: Nose normal.  Mouth/Throat: Oropharynx is clear and moist. No oropharyngeal exudate.  Eyes: Conjunctivae and EOM are normal. Pupils are equal, round, and reactive to light. Right eye exhibits no discharge. Left eye exhibits no discharge.  Neck: Normal range of motion. Neck supple.  Cardiovascular: Normal rate, regular rhythm and normal heart sounds.   Pulmonary/Chest: Effort normal and breath sounds normal. No respiratory distress. She has no wheezes. She has no rales.  Abdominal: Soft. Bowel sounds are normal. She exhibits no distension. There is no tenderness. There is no rebound and no guarding.  obese  Musculoskeletal:  Tenderness to palpation of left patellar medial >lateral with  bony crepitus of both kness and decreased ROM with no overlying erythema, swelling, or warmth   Neurological: She is alert and oriented to person, place, and time.  Skin: Skin is warm and dry. No rash noted. She is not diaphoretic. No erythema. No pallor.  Lipoma(?) on left LE   Psychiatric: She has a normal mood and affect. Her behavior is normal. Judgment and thought content normal.    Assessment & Plan:  Please see problem list for problem-based assessment and plan

## 2012-12-28 NOTE — Assessment & Plan Note (Addendum)
Assessment: Pt with controlled hypothyroidism with last TSH wnl on 07/29/12 who is compliant with replacement therapy and has baseline coldness, dry skin, constipation, and recent weight gain (per flow sheet 17 lb wt gain since 11/15/12).    Plan:  -Obtain TSH --> WNL (2.839) -Continue levothyroxine 112 mcg daily -Continue to monitor thyroid function and TSH

## 2012-12-28 NOTE — Assessment & Plan Note (Addendum)
Assessment:  Pt with bilateral OA of both knees with worsening left knee pain 1 month after finishing corticosteroid injections with last XR in October 2014 with moderate tricompartmental degenerative changes, most prominent in the medial and patellofemoral compartment with no recent falls or injuries but continued difficulty ambulating with locking of left knee and pain that is unrelieved with OTC pain medications who presents without inflammatory left knee changes and is hopeful to receive knee repalcement in upcoming year.    Plan:  -Ibuprofen  600 mg TID PRN pain   -Tramadol 50 mg TID PRN pain  -Pt instructed to use a cane to ambulate -Refer back to Dr. Neomia Dear for further management -Pt hopeful to receive b/l knee replacement in upcoming year once obtains insurance

## 2012-12-28 NOTE — Assessment & Plan Note (Signed)
Assessment: Pt with moderately well-controlled hypertension who is compliant with two-class anti-hypertensive therapy (CCB & diuretic) who presents with asymptomatic blood pressure of 159/87 later improved to 150/81.   Plan:  -BP 150/81 not at goal <140/80 -Continue amlodipine 10 mg daily -Continue furosemide 20 mg daily -Obtain BMP at next visit -Continue to monitor blood pressure

## 2012-12-29 NOTE — Progress Notes (Signed)
I saw and evaluated the patient.  I personally confirmed the key portions of the history and exam documented by Dr. Wilson and I reviewed pertinent patient test results.  The assessment, diagnosis, and plan were formulated together and I agree with the documentation in the resident's note. 

## 2013-01-02 ENCOUNTER — Other Ambulatory Visit: Payer: Self-pay | Admitting: *Deleted

## 2013-01-02 DIAGNOSIS — E785 Hyperlipidemia, unspecified: Secondary | ICD-10-CM

## 2013-01-03 MED ORDER — ROSUVASTATIN CALCIUM 5 MG PO TABS: 5.0000 mg | ORAL_TABLET | Freq: Every day | ORAL | Status: DC

## 2013-02-20 ENCOUNTER — Other Ambulatory Visit: Payer: Self-pay | Admitting: Internal Medicine

## 2013-03-22 ENCOUNTER — Encounter: Payer: Self-pay | Admitting: Internal Medicine

## 2013-03-22 ENCOUNTER — Ambulatory Visit (INDEPENDENT_AMBULATORY_CARE_PROVIDER_SITE_OTHER): Payer: BC Managed Care – PPO | Admitting: Internal Medicine

## 2013-03-22 ENCOUNTER — Telehealth: Payer: Self-pay | Admitting: *Deleted

## 2013-03-22 VITALS — BP 138/80 | HR 88 | Temp 97.8°F | Ht 60.0 in | Wt 177.4 lb

## 2013-03-22 DIAGNOSIS — M1990 Unspecified osteoarthritis, unspecified site: Secondary | ICD-10-CM

## 2013-03-22 DIAGNOSIS — E669 Obesity, unspecified: Secondary | ICD-10-CM

## 2013-03-22 DIAGNOSIS — J301 Allergic rhinitis due to pollen: Secondary | ICD-10-CM

## 2013-03-22 MED ORDER — LORATADINE 10 MG PO TABS: 10.0000 mg | ORAL_TABLET | Freq: Every day | ORAL | Status: DC

## 2013-03-22 MED ORDER — PHENOL 1.4 % MT LIQD: 1.0000 | OROMUCOSAL | Status: DC | PRN

## 2013-03-22 MED ORDER — AMLODIPINE BESYLATE 10 MG PO TABS: 10.0000 mg | ORAL_TABLET | Freq: Every day | ORAL | Status: DC

## 2013-03-22 NOTE — Patient Instructions (Signed)
We will give you a pill to help with the congestion called claritin (loratadine) that you take 1 pill a day. We have given you some throat spray to help with the pain of your throat to use as you need it.   We will send you back to [facility] to work on weight loss.   Come back as needed or as previously scheduled.

## 2013-03-22 NOTE — Telephone Encounter (Signed)
Pt called - past 2 weeks having h/a only when laying down and sore throat started 03/21/13. Also has inc sneezing and runny nose. Appt made today 1:15PM Dr Doug Sou. Hilda Blades Caran Storck RN 03/22/13 9:30AM

## 2013-03-22 NOTE — Progress Notes (Signed)
Subjective:     Patient ID: Lydia Walsh, female   DOB: 10-21-1955, 58 y.o.   MRN: [id number]  HPI The patient is a 58 YO female who is coming in today for a sorethroat and headache going on for about 2 weeks. She states she has had more congestion with the weather change. She has noticed congestion in her nose draining down the back of her throat causing mild irritation and pain. She took an aspirin which relieved the pain however she is concerned it may come back. She does have allergies however she's not been taking her allergy medicine. She states that she had one headache which lasted for one day which is not unusual for her. She is not having sinus pressure or pain in her for head. She's not having fevers or chills at home. She's not having shortness of breath or chest pains. She has other PMH of GERD, HTN, OSA, osteoarthritis.   Review of Systems  Constitutional: Negative for fever, chills, diaphoresis, activity change, appetite change, fatigue and unexpected weight change.  HENT: Positive for congestion, postnasal drip, rhinorrhea and sore throat. Negative for dental problem, drooling, ear discharge, ear pain, facial swelling, hearing loss, mouth sores, nosebleeds, sinus pressure, sneezing, tinnitus, trouble swallowing and voice change.   Respiratory: Negative for cough, chest tightness, shortness of breath and wheezing.   Cardiovascular: Negative for chest pain, palpitations and leg swelling.  Gastrointestinal: Negative for nausea and vomiting.  Neurological: Positive for headaches. Negative for dizziness, tremors, seizures, syncope, facial asymmetry, speech difficulty, weakness, light-headedness and numbness.       Objective:   Physical Exam  Constitutional: She is oriented to person, place, and time. She appears well-developed and well-nourished. No distress.  HENT:  Head: Normocephalic and atraumatic.  Right Ear: External ear normal.  Left Ear: External ear normal.  Nose: Nose  normal.  Mouth/Throat: Oropharynx is clear and moist. No oropharyngeal exudate.  Mild erythema of the throat, no purulent drainage or tonsillar swelling.  Cardiovascular: Normal rate and regular rhythm.   Pulmonary/Chest: Effort normal and breath sounds normal. No respiratory distress. She has no wheezes. She has no rales. She exhibits no tenderness.  Abdominal: Soft. Bowel sounds are normal.  Neurological: She is alert and oriented to person, place, and time. No cranial nerve deficit.  Skin: She is not diaphoretic.       Assessment/Plan:   1. Please see problem oriented charting.   2. Disposition - Patient will trial loratadine to help with allergies and given her Chloraseptic spray for pain if she needs it Clarified medication list as there were 3 different NSAIDs. She was advised not to take the following medications: Mobic, naproxen. She would like to go back to the diabetic educator for weight loss nutrition counseling. I did order this. She will come back as previously scheduled.

## 2013-03-23 NOTE — Assessment & Plan Note (Signed)
Patient not taking any allergy medicine. Loratadine sent in to her pharmacy. Advised the patient it may take 1-2 weeks to fully resolve her symptoms.

## 2013-03-23 NOTE — Assessment & Plan Note (Signed)
Due to multiple different pain medications on her list I did clarify with the patient, she is not taking Mobic or naproxen. These were removed from her medication list.

## 2013-03-24 NOTE — Progress Notes (Signed)
Case discussed with Dr. Kollar at the time of the visit.  We reviewed the resident's history and exam and pertinent patient test results.  I agree with the assessment, diagnosis, and plan of care documented in the resident's note.     

## 2013-04-05 ENCOUNTER — Encounter: Payer: Self-pay | Admitting: Internal Medicine

## 2013-04-27 ENCOUNTER — Encounter: Payer: Self-pay | Admitting: Dietician

## 2013-04-27 ENCOUNTER — Ambulatory Visit (INDEPENDENT_AMBULATORY_CARE_PROVIDER_SITE_OTHER): Payer: BC Managed Care – PPO | Admitting: Dietician

## 2013-04-27 VITALS — Ht 60.0 in | Wt 179.5 lb

## 2013-04-27 DIAGNOSIS — E669 Obesity, unspecified: Secondary | ICD-10-CM

## 2013-04-27 NOTE — Patient Instructions (Addendum)
Recommendations:  Call [facility] about exercise benefits  Do resistance exercise 2-3 times a week and continue to walk (~30 minutes) everyday  Buy yogurt instead of sweets/ice cream at the grocery store  Eat fried chicken once a week and bake other meats on the other days of the week  Pick healthy choices when eating out  Come back for an appointment in 2-3 weeks  Goals:  Weight loss of 9-18 pounds over a time period of 6 months  Work on eating yogurt instead of ice cream  [facility] Loda Bialas [phone]

## 2013-04-27 NOTE — Progress Notes (Signed)
  Medical Nutrition Therapy:  Appt start time: 1030 end time:  1125. Last seen May 2012 for weight loss. Assessment:  Primary concerns today: Weight management.  Pt reports she has lost weight (~20 lbs with body weight of ~140 lbs) about 2 years ago after seeking help about losing weight/weight management from previous pt appointment in May of 2012. Pt reports she had lost her weight from drinking water instead of soda, eating yogurt instead of sweets, baking her chicken instead of frying it, staying away from white bread/sweets, and by walking her dog frequently (3-4 times a day for ~15 minutes each time), however pt was only able to follow this lifestyle for 6 months and afterwards started to go back to her old ways of eating fried foods, ice cream, drinking soda, and lower physical activity. Pt since then has re-gained her weight. Pt reports while she lost her weight that she felt better, had more confidence, and had less knee pain.  Current usual eating pattern includes 3 meals and 0-1 snacks per day. Frequent foods include chinese take out of chicken and broccoli with rice, fried chicken, and ice cream. Pt reports eating fruits about 3 times a month.   24-hr recall: (Up at ~8AM) B (9 AM)- 1 Boiled egg or 2 slices of [place] lunch meat with 1 cup of coffee and sweetener   L ( 12 PM)- [place] take out- chicken and broccoli with yellow rice (lunch special in plastic container), water (Pt usually eats this 5 out of 7 days a week)  D ( 9 PM)- [employer] fried chicken sandwich with lettuce, tomato, and mayo with water   Usual physical activity includes walking her dog ~2 times a day for ~15 minutes each time.  Body mass index is 35.06 kg/(m^2).  Progress Towards Goal(s):  Modified goal(s). Initiation of progress   Nutritional Diagnosis: (Modified from last visit) NB 1.1 Food and nutrition related knowledge deficit as related to lack of prior exposure to information about how to maintain weight loss  and lack of self efficacy to make changes as evidenced by her statements of needing guidance in regards to weight loss knowledge. Ongoing [place] 3.3 Overweight/ Obesity as related to excess positive calorie balance as evidenced by weight of 179# and BMI of 35.      Intervention:  Pt was educated on starting to bring back her old habits as it was successful for helping her lose weight. Pt was educated on limiting her fried chicken to once a week and eating more yogurt instead of sweets or ice cream. Pt was encouraged on doing some resistance exercise 2-3 times a week and to continue to walk her dog. Pt was educated to call her insurance to ask about exercise benefits. Pt weight loss goal was set. Pt verbalized understanding. Goals: Weight loss of 9-18 lbs in a 6 month time frame Limit fried foods and ice cream/sweets Eat yogurt instead of ice cream Pick healthier choices when eating out Do resistance exercise 2-3 times a week and continue to walk her dog ~2 times a day for 15 minutes each time   Monitoring/Evaluation:  Dietary intake, exercise, and body weight in 2-3 week(s).

## 2013-05-01 ENCOUNTER — Encounter: Payer: Self-pay | Admitting: Internal Medicine

## 2013-05-02 ENCOUNTER — Ambulatory Visit (INDEPENDENT_AMBULATORY_CARE_PROVIDER_SITE_OTHER): Payer: BC Managed Care – PPO | Admitting: Internal Medicine

## 2013-05-02 ENCOUNTER — Encounter: Payer: Self-pay | Admitting: Internal Medicine

## 2013-05-02 VITALS — BP 155/87 | HR 97 | Temp 97.2°F | Wt 181.7 lb

## 2013-05-02 DIAGNOSIS — R5381 Other malaise: Secondary | ICD-10-CM

## 2013-05-02 DIAGNOSIS — R5383 Other fatigue: Secondary | ICD-10-CM | POA: Insufficient documentation

## 2013-05-02 DIAGNOSIS — M1990 Unspecified osteoarthritis, unspecified site: Secondary | ICD-10-CM

## 2013-05-02 DIAGNOSIS — I10 Essential (primary) hypertension: Secondary | ICD-10-CM

## 2013-05-02 LAB — CBC
HEMATOCRIT: 39 % (ref 36.0–46.0)
HEMOGLOBIN: 13.4 g/dL (ref 12.0–15.0)
MCH: 27.4 pg (ref 26.0–34.0)
MCHC: 34.4 g/dL (ref 30.0–36.0)
MCV: 79.8 fL (ref 78.0–100.0)
Platelets: 251 10*3/uL (ref 150–400)
RBC: 4.89 MIL/uL (ref 3.87–5.11)
RDW: 14.9 % (ref 11.5–15.5)
WBC: 6.8 10*3/uL (ref 4.0–10.5)

## 2013-05-02 MED ORDER — AMLODIPINE BESYLATE 10 MG PO TABS: 10.0000 mg | ORAL_TABLET | Freq: Every day | ORAL | Status: DC

## 2013-05-02 NOTE — Assessment & Plan Note (Signed)
Handicap placard form has been filled out, signed, and returned to the patient.

## 2013-05-02 NOTE — Patient Instructions (Addendum)
Thank you for your visit. - I have refilled your amlodipine. - Today we are checking your blood counts and your thyroid function because of your fatigue. If either of these tests are abnormal, I will call you. If you do not hear from me, it means they are normal. - I signed your form for a handicap placard. Please let us know if you need anything else. - Please return in 3 months for routine follow up.

## 2013-05-02 NOTE — Assessment & Plan Note (Addendum)
BP Readings from Last 3 Encounters:  05/02/13 155/87  03/22/13 138/80  12/26/12 159/87    Lab Results  Component Value Date   NA 139 08/19/2012   K 4.1 08/19/2012   CREATININE 0.55 08/19/2012    Assessment: Blood pressure control: mildly elevated Progress toward BP goal:  deteriorated  Plan: Medications:  continue current medications Educational resources provided: brochure Self management tools provided: home blood pressure logbook Other plans: Patient reports medication compliance, but does need a refill of her amlodipine. - Amlodipine refilled, prescription sent to [facility] as it is more affordable for here there - Continue to monitor, consider uptitrating medications if mildly elevated again at next follow up visit

## 2013-05-02 NOTE — Progress Notes (Signed)
Case discussed with Dr. Cater at the time of the visit.  We reviewed the resident's history and exam and pertinent patient test results.  I agree with the assessment, diagnosis and plan of care documented in the resident's note. 

## 2013-05-02 NOTE — Assessment & Plan Note (Addendum)
Patient reports fatigue for 2 weeks. She reports feeling similar many years ago when she was diagnosed with iron deficiency anemia. She reports she had to have a blood transfusion at one time (I do not see records of this, it may have happened before we were her PCP). Hemoglobin has been normal here since 2009. No blood per rectum. Patient has a history of hypothyroidism and takes Synthroid. She denies any symptoms of hypo-or hyperthyroidism at this time, no weight change, no hair or skin changes, no diarrhea or constipation. Last TSH wnl in 12/14. She admits her work schedule may have something to do with her fatigue. - Obtain CBC, TSH  ADDENDUM - CBC and TSH are wnl.  CBC    Component Value Date/Time   WBC 6.8 05/02/2013 1108   RBC 4.89 05/02/2013 1108   HGB 13.4 05/02/2013 1108   HCT 39.0 05/02/2013 1108   PLT 251 05/02/2013 1108   MCV 79.8 05/02/2013 1108   MCH 27.4 05/02/2013 1108   MCHC 34.4 05/02/2013 1108   RDW 14.9 05/02/2013 1108   LYMPHSABS 1.2 03/18/2010 0950   MONOABS 0.2 03/18/2010 0950   EOSABS 0.0 03/18/2010 0950   BASOSABS 0.0 03/18/2010 0950    TSH 2.625

## 2013-05-02 NOTE — Progress Notes (Signed)
Subjective:    Patient ID: Lydia Walsh, female    DOB: [birth year], 58 y.o.   MRN: [id number]  HPI  [name] is a 58 y.o. woman with past medical history of hypertension, hyperlipidemia, hypothyroidism (on replacement), gout, osteoarthritis, iron-deficiency anemia, and OSA who presents to obtain a handicap placard.   [place] and has to walk long distances to and from work because of the parking situation, which causes significant pain in her knees. I signed the form and gave to her today.  The patient also reports fatigue for 2 weeks. She says it "feels like it did when my hemoglobin was low man years ago, and I had to take iron supplements." Patient denies gross bleeding or blood in her stool. Patient denies hair or skin changes. Denies diarrhea or constipation. Denies shortness of breath or chest pain. She admits her symptoms may be due to her work schedule. 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 The weather has been bad the past week, so she has been staying later than usual.   Current Outpatient Prescriptions on File Prior to Visit  Medication Sig Dispense Refill  . acetaminophen (TYLENOL) 325 MG tablet Take 2 tablets (650 mg total) by mouth every 6 (six) hours as needed for pain.      Marland Kitchen allopurinol (ZYLOPRIM) 100 MG tablet TAKE ONE TABLET BY MOUTH ONCE DAILY  30 tablet  11  . amLODipine (NORVASC) 10 MG tablet Take 1 tablet (10 mg total) by mouth daily.  30 tablet  11  . furosemide (LASIX) 20 MG tablet TAKE ONE TABLET BY MOUTH ONCE DAILY  30 tablet  11  . ibuprofen (ADVIL,MOTRIN) 600 MG tablet Take 1 tablet (600 mg total) by mouth every 8 (eight) hours as needed.  90 tablet  0  . levothyroxine (SYNTHROID, LEVOTHROID) 112 MCG tablet Take 1 tablet (112 mcg total) by mouth daily.  90 tablet  3  . loratadine (CLARITIN) 10 MG tablet Take 1 tablet (10 mg total) by mouth daily.  30 tablet  2  . phenol (CHLORASEPTIC) 1.4 % LIQD Use  as directed 1-2 sprays in the mouth or throat as needed for throat irritation / pain.  20 mL  0  . rosuvastatin (CRESTOR) 5 MG tablet Take 1 tablet (5 mg total) by mouth at bedtime.  30 tablet  11  . traMADol (ULTRAM) 50 MG tablet Take 1 tablet (50 mg total) by mouth every 8 (eight) hours as needed.  90 tablet  0  . [DISCONTINUED] esomeprazole (NEXIUM) 20 MG capsule Take 2 capsules (40 mg total) by mouth daily before breakfast.  30 capsule  3  . [DISCONTINUED] KLOR-CON M20 20 MEQ tablet         Review of Systems  Constitutional: Positive for fatigue. Negative for fever, chills and unexpected weight change.  Respiratory: Negative for cough and shortness of breath.   Cardiovascular: Negative for chest pain.  Gastrointestinal: Negative for diarrhea and constipation.  Endocrine: Negative for cold intolerance and heat intolerance.  Musculoskeletal: Positive for arthralgias (Knees from OA, chronic).  Skin: Negative for color change, pallor and rash.  Neurological: Negative for weakness and numbness.      Objective:   Physical Exam  Constitutional: She is oriented to person, place, and time. She appears well-developed and well-nourished. No distress.  HENT:  Head: Normocephalic and atraumatic.  Eyes: Conjunctivae and EOM are normal. Pupils are equal, round, and reactive  to light.  Normal sclerae.  Neck: Normal range of motion. Neck supple.  Cardiovascular: Normal rate, regular rhythm and normal heart sounds.  Exam reveals no gallop and no friction rub.   No murmur heard. Pulmonary/Chest: Breath sounds normal. No respiratory distress. She has no wheezes. She has no rales. She exhibits no tenderness.  Musculoskeletal: Normal range of motion. She exhibits no edema and no tenderness.  Neurological: She is alert and oriented to person, place, and time. No cranial nerve deficit.  Skin: Skin is warm and dry.  Psychiatric: She has a normal mood and affect. Her behavior is normal.    Filed Vitals:    05/02/13 1040  BP: 155/87  Pulse: 97  Temp: 97.2 F (36.2 C)      Assessment & Plan:   Please see problem based charting.

## 2013-05-03 LAB — TSH: TSH: 2.625 u[IU]/mL (ref 0.350–4.500)

## 2013-05-19 ENCOUNTER — Other Ambulatory Visit: Payer: Self-pay | Admitting: *Deleted

## 2013-05-19 DIAGNOSIS — M1990 Unspecified osteoarthritis, unspecified site: Secondary | ICD-10-CM

## 2013-05-19 MED ORDER — IBUPROFEN 600 MG PO TABS: 600.0000 mg | ORAL_TABLET | Freq: Three times a day (TID) | ORAL | Status: DC | PRN

## 2013-05-19 NOTE — Telephone Encounter (Signed)
Rx called in to pharmacy. 

## 2013-06-02 ENCOUNTER — Encounter: Payer: Self-pay | Admitting: Internal Medicine

## 2013-06-02 ENCOUNTER — Ambulatory Visit: Payer: BC Managed Care – PPO | Admitting: Internal Medicine

## 2013-06-02 ENCOUNTER — Ambulatory Visit (INDEPENDENT_AMBULATORY_CARE_PROVIDER_SITE_OTHER): Payer: BC Managed Care – PPO | Admitting: Internal Medicine

## 2013-06-02 VITALS — BP 134/76 | HR 85 | Temp 97.7°F | Ht 60.0 in | Wt 177.6 lb

## 2013-06-02 DIAGNOSIS — F329 Major depressive disorder, single episode, unspecified: Secondary | ICD-10-CM

## 2013-06-02 DIAGNOSIS — E785 Hyperlipidemia, unspecified: Secondary | ICD-10-CM

## 2013-06-02 DIAGNOSIS — R5383 Other fatigue: Secondary | ICD-10-CM

## 2013-06-02 DIAGNOSIS — M109 Gout, unspecified: Secondary | ICD-10-CM

## 2013-06-02 DIAGNOSIS — E039 Hypothyroidism, unspecified: Secondary | ICD-10-CM

## 2013-06-02 HISTORY — DX: Major depressive disorder, single episode, unspecified: F32.9

## 2013-06-02 MED ORDER — ALLOPURINOL 100 MG PO TABS: 100.0000 mg | ORAL_TABLET | Freq: Every day | ORAL | Status: DC

## 2013-06-02 MED ORDER — LEVOTHYROXINE SODIUM 112 MCG PO TABS: 112.0000 ug | ORAL_TABLET | Freq: Every day | ORAL | Status: DC

## 2013-06-02 MED ORDER — ROSUVASTATIN CALCIUM 5 MG PO TABS: 5.0000 mg | ORAL_TABLET | Freq: Every day | ORAL | Status: DC

## 2013-06-02 MED ORDER — CITALOPRAM HYDROBROMIDE 20 MG PO TABS: 20.0000 mg | ORAL_TABLET | Freq: Every day | ORAL | Status: DC

## 2013-06-02 NOTE — Patient Instructions (Signed)

## 2013-06-02 NOTE — Assessment & Plan Note (Signed)
Patient symptoms of lack of interest, pleasure in doing things, feeling down/depressed, trouble falling asleep, feeling tired, over eating, feeling bad about herself are suggestive of major depression. On PHQ -9, patient scored 15 points and is suggestive of moderately severe major depression. Discussed with the attending regarding further management and plan.  Plans: Start SSRI. Start Citalopram at 20 mg qd. Educated patient that she will feel more depressed or even have suicidal ideations before she would start feeling better and to call the clinic if she has any issues with the medication. Patient is aware of the plan and is willing to give it a try. Recommended patient to maintain a good sleep pattern and at least 8 hours of sleep every day. Patient is to follow up in the office in a month for evaluation of her symptoms. Consider adding a low dose BZD's for a sleeping aid if she has difficulty falling asleep at the next office visit.

## 2013-06-02 NOTE — Progress Notes (Signed)
Case discussed with Dr. Boggala soon after the resident saw the patient.  We reviewed the resident's history and exam and pertinent patient test results.  I agree with the assessment, diagnosis, and plan of care documented in the resident's note. 

## 2013-06-02 NOTE — Assessment & Plan Note (Signed)
TSH within normal limits last month.  Plans: Continue current strength of synthroid.

## 2013-06-02 NOTE — Progress Notes (Signed)
Subjective:   Patient ID: Lydia Walsh female   DOB: 1955/10/13 58 y.o.   MRN: [id number]  HPI: Lydia Walsh is a 58 y.o. woman with PMH significant for HTN, Hypothyroidism, HLD, Gout comes to the office with CC of "fatigue" for the last several weeks.  Patient was seen in the office on 05/02/13 for symptoms of fatigue and she is here for a follow up of her previous visit. Patients TSH, CBC from her previous office visit are within normal limits.  Patient states that she feels tired all the time and does not energy to do any activities. She states that her symptoms started about 2 months ago.  She reports that she started a new job as Mudlogger and her hours at work are from 3pm-69midnight. She gets home around 1am every day. She has difficulty falling asleep after that and difficulty sleeping and reports that she usually goes to bed around 3-4AM and gets up at around 7am. Patient states that she only sleeps about 4 hours a day. She works 5 days a week and has similar schedules on her working days.   She states that she has financial issues and wanting to move to her own apartment for a long time now. Upon questioning further, patient states that she was jobless for almost one year and that she has been feeling like this for the similar duration of time.   She denies any SOB, chest pain, head aches, dizziness, weakness of the extremities, abdominal pain, nausea, vomiting.   She denies any other complaints.  Past Medical History  Diagnosis Date  . Hyperlipidemia   . Hypertension   . Hypothyroidism   . Anemia     iron deficiency anemia  . Osteoarthritis   . Sleep apnea    Current Outpatient Prescriptions  Medication Sig Dispense Refill  . acetaminophen (TYLENOL) 325 MG tablet Take 2 tablets (650 mg total) by mouth every 6 (six) hours as needed for pain.      Marland Kitchen allopurinol (ZYLOPRIM) 100 MG tablet Take 1 tablet (100 mg total) by mouth daily.  30 tablet  11  .  amLODipine (NORVASC) 10 MG tablet Take 1 tablet (10 mg total) by mouth daily.  30 tablet  11  . citalopram (CELEXA) 20 MG tablet Take 1 tablet (20 mg total) by mouth daily.  30 tablet  3  . furosemide (LASIX) 20 MG tablet TAKE ONE TABLET BY MOUTH ONCE DAILY  30 tablet  11  . ibuprofen (ADVIL,MOTRIN) 600 MG tablet Take 1 tablet (600 mg total) by mouth every 8 (eight) hours as needed.  90 tablet  0  . levothyroxine (SYNTHROID, LEVOTHROID) 112 MCG tablet Take 1 tablet (112 mcg total) by mouth daily.  90 tablet  3  . loratadine (CLARITIN) 10 MG tablet Take 1 tablet (10 mg total) by mouth daily.  30 tablet  2  . phenol (CHLORASEPTIC) 1.4 % LIQD Use as directed 1-2 sprays in the mouth or throat as needed for throat irritation / pain.  20 mL  0  . rosuvastatin (CRESTOR) 5 MG tablet Take 1 tablet (5 mg total) by mouth at bedtime.  30 tablet  11  . traMADol (ULTRAM) 50 MG tablet Take 1 tablet (50 mg total) by mouth every 8 (eight) hours as needed.  90 tablet  0  . [DISCONTINUED] esomeprazole (NEXIUM) 20 MG capsule Take 2 capsules (40 mg total) by mouth daily before breakfast.  30 capsule  3  . [  DISCONTINUED] KLOR-CON M20 20 MEQ tablet        No current facility-administered medications for this visit.   No family history on file. History   Social History  . Marital Status: Divorced    Spouse Name: N/A    Number of Children: N/A  . Years of Education: N/A   Social History Main Topics  . Smoking status: Former Research scientist (life sciences)  . Smokeless tobacco: None     Comment: quit x 32yrs.  . Alcohol Use: No  . Drug Use: No  . Sexual Activity: None   Other Topics Concern  . None   Social History Narrative   Lives with her son.  Perimenopausal.   Review of Systems: Pertinent items are noted in HPI. Objective:  Physical Exam: Filed Vitals:   06/02/13 0950  BP: 134/76  Pulse: 85  Temp: 97.7 F (36.5 C)  TempSrc: Oral  Height: 5' (1.524 m)  Weight: 177 lb 9.6 oz (80.559 kg)  SpO2: 98%    Constitutional: Vital signs reviewed.  Patient is a well-developed and well-nourished and is in no acute distress and cooperative with exam. Alert and oriented x3.  Head: Normocephalic and atraumatic Neck: Supple. No palpable thyroid. Cardiovascular: RRR, S1 normal, S2 normal, no MRG Pulmonary/Chest: normal respiratory effort, CTAB, no wheezes, rales, or rhonchi Musculoskeletal: No joint deformities, erythema, or stiffness, ROM full and no nontender Neurological: A&O x3, Strength is normal and symmetric bilaterally, cranial nerve II-XII are grossly intact, no focal motor deficit, sensory intact to light touch bilaterally.  Skin: Warm, dry and intact Psychiatric: Patient appears depressed.   Assessment & Plan:

## 2013-06-13 ENCOUNTER — Telehealth: Payer: Self-pay | Admitting: Internal Medicine

## 2013-06-13 DIAGNOSIS — E785 Hyperlipidemia, unspecified: Secondary | ICD-10-CM

## 2013-06-13 DIAGNOSIS — F329 Major depressive disorder, single episode, unspecified: Secondary | ICD-10-CM

## 2013-06-13 MED ORDER — PRAVASTATIN SODIUM 40 MG PO TABS: 40.0000 mg | ORAL_TABLET | Freq: Every evening | ORAL | Status: DC

## 2013-06-13 NOTE — Assessment & Plan Note (Signed)
Patient said that she cannot afford crestor as it costs 160 dollars a month(See my telephone note from 06/13/13 for further details). Requesting to change the medicine to something cheaper. Will change to pravastatin.

## 2013-06-13 NOTE — Telephone Encounter (Signed)
Called patient to check on how she is doing after starting her on Celexa for depressison. Patient reports "feeling better". Patient states that she is sleeping better. Recommended to continue celexa and to follow up with me as was advised in 4 weeks from the date i last saw her. Patient said that the crestor is very expensive and that the pharmacy is asking her to pay 160 dollars and is asking me to change it to something different. I said we will change it to Pravastatin instead of Crestor.

## 2013-06-13 NOTE — Assessment & Plan Note (Signed)
I called patient on 06/13/13 to see how she is doing after starting her on SSRI for her depression. Patient reports feeling better. Patient states that she is sleeping better. Recommended to follow up with the clinic in 4 weeks as was planned before.

## 2013-07-23 ENCOUNTER — Encounter (HOSPITAL_COMMUNITY): Payer: Self-pay | Admitting: Emergency Medicine

## 2013-07-23 ENCOUNTER — Emergency Department (INDEPENDENT_AMBULATORY_CARE_PROVIDER_SITE_OTHER)
Admission: EM | Admit: 2013-07-23 | Discharge: 2013-07-23 | Disposition: A | Discharge status: Home / Self Care | Payer: BC Managed Care – PPO | Source: Home / Self Care

## 2013-07-23 DIAGNOSIS — W57XXXA Bitten or stung by nonvenomous insect and other nonvenomous arthropods, initial encounter: Secondary | ICD-10-CM

## 2013-07-23 DIAGNOSIS — T148 Other injury of unspecified body region: Secondary | ICD-10-CM

## 2013-07-23 MED ORDER — BETAMETHASONE DIPROPIONATE AUG 0.05 % EX CREA: TOPICAL_CREAM | Freq: Two times a day (BID) | CUTANEOUS | Status: DC

## 2013-07-23 NOTE — Discharge Instructions (Signed)

## 2013-07-23 NOTE — ED Provider Notes (Signed)
Medical screening examination/treatment/procedure(s) were performed by resident physician or non-physician practitioner and as supervising physician I was immediately available for consultation/collaboration.   Pauline Good MD.   Billy Fischer, MD 07/23/13 [phone]

## 2013-07-23 NOTE — ED Provider Notes (Signed)
CSN: [id number]     Arrival date & time 07/23/13  1735 History   None    Chief Complaint  Patient presents with  . Insect Bite   (Consider location/radiation/quality/duration/timing/severity/associated sxs/prior Treatment) HPI Comments: 58 year old female presents complaining of an insect bite on her right arm. She was walking and she felt something land on her arm. She had immediate stinging pain which has resolved but now the area is itching. It is red and very warm to touch. This happened yesterday and is getting worse. She denies any systemic symptoms. No treatments tried at home.   Past Medical History  Diagnosis Date  . Hyperlipidemia   . Hypertension   . Hypothyroidism   . Anemia     iron deficiency anemia  . Osteoarthritis   . Sleep apnea    Past Surgical History  Procedure Laterality Date  . Myomectomy     No family history on file. History  Substance Use Topics  . Smoking status: Former Research scientist (life sciences)  . Smokeless tobacco: Not on file     Comment: quit x 4yrs.  . Alcohol Use: No   OB History   Grav Para Term Preterm Abortions TAB SAB Ect Mult Living                 Review of Systems  Skin: Positive for rash.  All other systems reviewed and are negative.   Allergies  Ace inhibitors and Hctz  Home Medications   Prior to Admission medications   Medication Sig Start Date End Date Taking? Authorizing Provider  acetaminophen (TYLENOL) 325 MG tablet Take 2 tablets (650 mg total) by mouth every 6 (six) hours as needed for pain. 11/02/11   Rosalia Hammers, MD  allopurinol (ZYLOPRIM) 100 MG tablet Take 1 tablet (100 mg total) by mouth daily. 06/02/13   Carter Kitten, MD  amLODipine (NORVASC) 10 MG tablet Take 1 tablet (10 mg total) by mouth daily. 05/02/13   Lesly Dukes, MD  augmented betamethasone dipropionate (DIPROLENE AF) 0.05 % cream Apply topically 2 (two) times daily. 07/23/13   Freeman Caldron Seleste Tallman, PA-C  citalopram (CELEXA) 20 MG tablet Take 1 tablet (20 mg total) by  mouth daily. 06/02/13   Carter Kitten, MD  furosemide (LASIX) 20 MG tablet TAKE ONE TABLET BY MOUTH ONCE DAILY 02/20/13   Lesly Dukes, MD  ibuprofen (ADVIL,MOTRIN) 600 MG tablet Take 1 tablet (600 mg total) by mouth every 8 (eight) hours as needed. 05/19/13   Lesly Dukes, MD  levothyroxine (SYNTHROID, LEVOTHROID) 112 MCG tablet Take 1 tablet (112 mcg total) by mouth daily. 06/02/13 06/02/14  Carter Kitten, MD  loratadine (CLARITIN) 10 MG tablet Take 1 tablet (10 mg total) by mouth daily. 03/22/13 03/22/14  Olga Millers, MD  phenol (CHLORASEPTIC) 1.4 % LIQD Use as directed 1-2 sprays in the mouth or throat as needed for throat irritation / pain. 03/22/13   Olga Millers, MD  pravastatin (PRAVACHOL) 40 MG tablet Take 1 tablet (40 mg total) by mouth every evening. 06/13/13 06/13/14  Carter Kitten, MD  traMADol (ULTRAM) 50 MG tablet Take 1 tablet (50 mg total) by mouth every 8 (eight) hours as needed. 12/26/12 12/26/13  Marjan Rabbani, MD   BP 177/88  Pulse 94  Temp(Src) 99.8 F (37.7 C) (Oral)  Resp 18  SpO2 99% Physical Exam  Nursing note and vitals reviewed. Constitutional: She is oriented to person, place, and time. Vital signs are normal. She appears well-developed and well-nourished. No distress.  HENT:  Head:  Normocephalic and atraumatic.  Pulmonary/Chest: Effort normal. No respiratory distress.  Neurological: She is alert and oriented to person, place, and time. She has normal strength. Coordination normal.  Skin: Skin is warm and dry. Rash (on the right arm, circumscribed area of erythema, warmth, without induration or tenderness) noted. She is not diaphoretic.  Psychiatric: She has a normal mood and affect. Judgment normal.    ED Course  Procedures (including critical care time) Labs Review Labs Reviewed - No data to display  Imaging Review No results found.   MDM   1. Insect bite    Treat with Diprolene steroid cream. Followup if no improvement in a couple days, or  immediately if she starts to develop fever   Meds ordered this encounter  Medications  . augmented betamethasone dipropionate (DIPROLENE AF) 0.05 % cream    Sig: Apply topically 2 (two) times daily.    Dispense:  30 g    Refill:  0    Order Specific Question:  Supervising Provider    Answer:  Jake Michaelis, DAVID C [[id number]]       Liam Graham, PA-C 07/23/13 1911

## 2013-07-23 NOTE — ED Notes (Signed)
Pt c/o insect bite on right upper arm onset yesterday. Area is red and swollen. Patient reports she used rubbing alcohol. Pt is alert and oriented and in no acute distress.

## 2013-08-02 ENCOUNTER — Encounter: Payer: Self-pay | Admitting: Internal Medicine

## 2013-08-02 ENCOUNTER — Ambulatory Visit (INDEPENDENT_AMBULATORY_CARE_PROVIDER_SITE_OTHER): Payer: BC Managed Care – PPO | Admitting: Internal Medicine

## 2013-08-02 ENCOUNTER — Ambulatory Visit (HOSPITAL_COMMUNITY)
Admission: RE | Admit: 2013-08-02 | Discharge: 2013-08-02 | Disposition: A | Discharge status: Home / Self Care | Payer: BC Managed Care – PPO | Source: Ambulatory Visit | Attending: Oncology | Admitting: Oncology

## 2013-08-02 VITALS — BP 147/84 | HR 97 | Temp 97.9°F | Wt 183.3 lb

## 2013-08-02 DIAGNOSIS — R002 Palpitations: Secondary | ICD-10-CM | POA: Insufficient documentation

## 2013-08-02 DIAGNOSIS — E039 Hypothyroidism, unspecified: Secondary | ICD-10-CM

## 2013-08-02 DIAGNOSIS — E785 Hyperlipidemia, unspecified: Secondary | ICD-10-CM

## 2013-08-02 LAB — COMPLETE METABOLIC PANEL WITH GFR
ALT: 23 U/L (ref 0–35)
AST: 21 U/L (ref 0–37)
Albumin: 4.1 g/dL (ref 3.5–5.2)
Alkaline Phosphatase: 114 U/L (ref 39–117)
BUN: 13 mg/dL (ref 6–23)
CO2: 30 meq/L (ref 19–32)
CREATININE: 0.54 mg/dL (ref 0.50–1.10)
Calcium: 9.6 mg/dL (ref 8.4–10.5)
Chloride: 99 mEq/L (ref 96–112)
GFR, Est African American: >89 mL/min
GFR, Est Non African American: >89 mL/min
Glucose, Bld: 111 mg/dL — ABNORMAL HIGH (ref 70–99)
Potassium: 3.4 mEq/L — ABNORMAL LOW (ref 3.5–5.3)
Sodium: 139 mEq/L (ref 135–145)
Total Bilirubin: 0.4 mg/dL (ref 0.2–1.2)
Total Protein: 7.1 g/dL (ref 6.0–8.3)

## 2013-08-02 LAB — TSH: TSH: 1.763 u[IU]/mL (ref 0.350–4.500)

## 2013-08-02 MED ORDER — ROSUVASTATIN CALCIUM 20 MG PO TABS: 20.0000 mg | ORAL_TABLET | Freq: Every day | ORAL | Status: DC

## 2013-08-02 NOTE — Assessment & Plan Note (Signed)
  Plans Check TSH.

## 2013-08-02 NOTE — Progress Notes (Signed)
Subjective:   Patient ID: Lydia Walsh female   DOB: 1955/02/06 58 y.o.   MRN: [id number]  HPI: Lydia Walsh is a 58 y.o. woman with PMH significant for HTN, Hypothyroidism, HLD, Gout comes to the office with the following complaints.  1. Muscle cramps: In legs and arms. Patient was started on pravastatin last month as she was unable to afford crestor. She reports that her cramps started after she started taking pravastatin. She denies any fever, chills. She denies any other complaints.  2. "Fluttering": Patient reports fluttering over the last 2 weeks. Occur daily, not related to activity, spontaneous, last 10-15 minutes, not associated with any chest pain, sob, dizziness, light headedness, nausea/vomiting, headache, abdominal pain. She denies any other symptoms. She reports compliance to her synthroid. She reports that she drinks "lot of coffee" daily. She presented to the clinic with the same complaints in the past, and the ECG have been negative. It was thought to be secondary to synthroid/thyroid relate.   She denies any other complaints.    Past Medical History  Diagnosis Date  . Hyperlipidemia   . Hypertension   . Hypothyroidism   . Anemia     iron deficiency anemia  . Osteoarthritis   . Sleep apnea    Current Outpatient Prescriptions  Medication Sig Dispense Refill  . allopurinol (ZYLOPRIM) 100 MG tablet Take 1 tablet (100 mg total) by mouth daily.  30 tablet  11  . amLODipine (NORVASC) 10 MG tablet Take 1 tablet (10 mg total) by mouth daily.  30 tablet  11  . augmented betamethasone dipropionate (DIPROLENE AF) 0.05 % cream Apply topically 2 (two) times daily.  30 g  0  . furosemide (LASIX) 20 MG tablet TAKE ONE TABLET BY MOUTH ONCE DAILY  30 tablet  11  . ibuprofen (ADVIL,MOTRIN) 600 MG tablet Take 1 tablet (600 mg total) by mouth every 8 (eight) hours as needed.  90 tablet  0  . levothyroxine (SYNTHROID, LEVOTHROID) 112 MCG tablet Take 1 tablet (112 mcg total) by  mouth daily.  90 tablet  3  . loratadine (CLARITIN) 10 MG tablet Take 1 tablet (10 mg total) by mouth daily.  30 tablet  2  . acetaminophen (TYLENOL) 325 MG tablet Take 2 tablets (650 mg total) by mouth every 6 (six) hours as needed for pain.      . rosuvastatin (CRESTOR) 20 MG tablet Take 1 tablet (20 mg total) by mouth at bedtime.  30 tablet  11  . [DISCONTINUED] esomeprazole (NEXIUM) 20 MG capsule Take 2 capsules (40 mg total) by mouth daily before breakfast.  30 capsule  3  . [DISCONTINUED] KLOR-CON M20 20 MEQ tablet        No current facility-administered medications for this visit.   No family history on file. History   Social History  . Marital Status: Divorced    Spouse Name: N/A    Number of Children: N/A  . Years of Education: N/A   Social History Main Topics  . Smoking status: Former Research scientist (life sciences)  . Smokeless tobacco: None     Comment: quit x 6yrs.  . Alcohol Use: No  . Drug Use: No  . Sexual Activity: None   Other Topics Concern  . None   Social History Narrative   Lives with her son.  Perimenopausal.   Review of Systems: Pertinent items are noted in HPI. Objective:  Physical Exam: Filed Vitals:   08/02/13 1049  BP: 147/84  Pulse: 97  Temp: 97.9 F (36.6 C)  TempSrc: Oral  Weight: 183 lb 4.8 oz (83.144 kg)  SpO2: 98%   Constitutional: Vital signs reviewed. Patient is a well-developed and well-nourished and is in no acute distress and cooperative with exam. Alert and oriented x3.  Neck: Supple. No palpable thyroid.  Cardiovascular: RRR, S1 normal, S2 normal, no MRG  Pulmonary/Chest: normal respiratory effort, CTAB, no wheezes, rales, or rhonchi  Musculoskeletal: No joint deformities, erythema, or stiffness, ROM full and no nontender Neurological: A&O x3, Strength is normal and symmetric bilaterally, cranial nerve II-XII are grossly intact, no focal motor deficit, sensory intact to light touch bilaterally.  Skin: Warm, dry and intact   Assessment & Plan:

## 2013-08-02 NOTE — Assessment & Plan Note (Signed)
Complaining of muscle cramps since started on pravastatin. Per patient, she never had cramps while on crestor and is requesting to switch back to crestor.  Plans: D/C Pravastatin. Start Crestor 20 mg po qhs.

## 2013-08-02 NOTE — Patient Instructions (Addendum)
Take all the medications as recommended below. If you have more fluttering or any other symptoms associated with fluttering, call us or seek medical help.

## 2013-08-02 NOTE — Progress Notes (Signed)
Attending physician note: Presenting problems, physical findings, medical management, review of resident physician Dr. Yolonda Kida and I concur with his assessment and plan above. Murriel Hopper, M.D., [place]

## 2013-08-02 NOTE — Assessment & Plan Note (Signed)
Patient never refilled her SSRI and wants to remove the medication.

## 2013-08-02 NOTE — Assessment & Plan Note (Signed)
Palpitations lasting about 10-15 minutes with no other associated symptoms or signs with spontaneous resolution. History of similar symptoms in the past with normal ECG and no structural/ coronary heart disease. Suspect secondary to Synthroid vs Excess nicotine use. 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 No atrial, junctional, ventricular arrythmia noted. No ST/T wave changes noted.  Plans: Check TSH, CMP. Continue current management. Recommended to call if symptoms worsen or if she notices any new symptoms such as chest pain, dizziness, sweating, N/V, headache etc. Consider cardiology referral if symptoms persist or worsen.

## 2013-08-04 ENCOUNTER — Other Ambulatory Visit: Payer: Self-pay | Admitting: Internal Medicine

## 2013-08-04 DIAGNOSIS — I10 Essential (primary) hypertension: Secondary | ICD-10-CM

## 2013-08-04 MED ORDER — POTASSIUM CHLORIDE ER 10 MEQ PO TBCR: 10.0000 meq | EXTENDED_RELEASE_TABLET | Freq: Every day | ORAL | Status: DC

## 2013-08-04 NOTE — Assessment & Plan Note (Signed)
Hypokalemia noted on the BMP.  Plans: Supplement KCl 10 meq po qd along with Lasix. Called patient and informed about the plans. Recommended to start taking the potassium from today.

## 2013-08-11 ENCOUNTER — Other Ambulatory Visit: Payer: Self-pay | Admitting: *Deleted

## 2013-08-11 MED ORDER — IBUPROFEN 600 MG PO TABS: 600.0000 mg | ORAL_TABLET | Freq: Three times a day (TID) | ORAL | Status: DC | PRN

## 2013-08-11 NOTE — Telephone Encounter (Signed)
Cr is nl. Will fill one and pt has PCP appt to discuss if should be on long term NSAID

## 2013-08-15 NOTE — Telephone Encounter (Signed)
Called to pharm 

## 2013-10-04 ENCOUNTER — Ambulatory Visit (INDEPENDENT_AMBULATORY_CARE_PROVIDER_SITE_OTHER): Payer: BC Managed Care – PPO | Admitting: Internal Medicine

## 2013-10-04 ENCOUNTER — Encounter: Payer: Self-pay | Admitting: Internal Medicine

## 2013-10-04 VITALS — BP 128/79 | HR 98 | Temp 98.0°F | Wt 185.2 lb

## 2013-10-04 DIAGNOSIS — R252 Cramp and spasm: Secondary | ICD-10-CM

## 2013-10-04 MED ORDER — DOCUSATE SODIUM 100 MG PO TABS: 100.0000 mg | ORAL_TABLET | Freq: Two times a day (BID) | ORAL | Status: DC

## 2013-10-04 MED ORDER — DOCUSATE SODIUM 100 MG PO CAPS: 100.0000 mg | ORAL_CAPSULE | Freq: Two times a day (BID) | ORAL | Status: DC

## 2013-10-04 NOTE — Progress Notes (Signed)
Patient ID: NIKIRA KUSHNIR, female   DOB: [birth year], 58 y.o.   MRN: [id number]   Subjective:   Patient ID: SERENITI WAN female   DOB: Jan 27, 1955 58 y.o.   MRN: [id number]  HPI: Ms.Jasmina S Hesser is a 58 y.o. with PMH of listed below. Presentd today with complaints of cramps, of 1 week duration. Present on her extremities, bilat- mostly fingers and toes. No aggrav or relieving fatctors. Pt say sshe hardly drinks water all day, takes a cup of water most days only. Pt has present previously with similar complaints , lab work showed a K of 3.4, she was prescribed 10mg  daily of KCl which she took. Cramps improved but returned again 1 week ago. No aggrav or relieving factors, intermittent and not worse at night. Pt was also switched to crestor, was previosly on pravastatin, and started having cramps while taking pravastatin. Pt is complaint with Crestor. No vomiting, diarrhea and doesn't take alcohol.   Past Medical History  Diagnosis Date  . Hyperlipidemia   . Hypertension   . Hypothyroidism   . Anemia     iron deficiency anemia  . Osteoarthritis   . Sleep apnea    Current Outpatient Prescriptions  Medication Sig Dispense Refill  . acetaminophen (TYLENOL) 325 MG tablet Take 2 tablets (650 mg total) by mouth every 6 (six) hours as needed for pain.      Marland Kitchen allopurinol (ZYLOPRIM) 100 MG tablet Take 1 tablet (100 mg total) by mouth daily.  30 tablet  11  . amLODipine (NORVASC) 10 MG tablet Take 1 tablet (10 mg total) by mouth daily.  30 tablet  11  . augmented betamethasone dipropionate (DIPROLENE AF) 0.05 % cream Apply topically 2 (two) times daily.  30 g  0  . furosemide (LASIX) 20 MG tablet TAKE ONE TABLET BY MOUTH ONCE DAILY  30 tablet  11  . ibuprofen (ADVIL,MOTRIN) 600 MG tablet Take 1 tablet (600 mg total) by mouth every 8 (eight) hours as needed.  90 tablet  0  . levothyroxine (SYNTHROID, LEVOTHROID) 112 MCG tablet Take 1 tablet (112 mcg total) by mouth daily.  90 tablet  3  . loratadine  (CLARITIN) 10 MG tablet Take 1 tablet (10 mg total) by mouth daily.  30 tablet  2  . potassium chloride (K-DUR) 10 MEQ tablet Take 1 tablet (10 mEq total) by mouth daily.  30 tablet  2  . rosuvastatin (CRESTOR) 20 MG tablet Take 1 tablet (20 mg total) by mouth at bedtime.  30 tablet  11  . [DISCONTINUED] esomeprazole (NEXIUM) 20 MG capsule Take 2 capsules (40 mg total) by mouth daily before breakfast.  30 capsule  3  . [DISCONTINUED] KLOR-CON M20 20 MEQ tablet        No current facility-administered medications for this visit.   No family history on file. History   Social History  . Marital Status: Divorced    Spouse Name: N/A    Number of Children: N/A  . Years of Education: N/A   Social History Main Topics  . Smoking status: Former Research scientist (life sciences)  . Smokeless tobacco: None     Comment: quit x 20yrs.  . Alcohol Use: No  . Drug Use: No  . Sexual Activity: None   Other Topics Concern  . None   Social History Narrative   Lives with her son.  Perimenopausal.   Review of Systems: CONSTITUTIONAL- No Fever, weightloss, night sweat or change in appetite. SKIN- No Rash, colour changes or  itching. HEAD- No Headache or dizziness. RESPIRATORY- No Cough or SOB. CARDIAC- No Palpitations, DOE, PND or chest pain. GI- No nausea, vomiting, diarrhoea, constipation, abd pain. URINARY- No Frequency, urgency, straining or dysuria. NEUROLOGIC- No Numbness, syncope, seizures. [facility]- Denies depression or anxiety.  Objective:  Physical Exam: Filed Vitals:   10/04/13 1450  BP: 128/79  Pulse: 98  Temp: 98 F (36.7 C)  TempSrc: Oral  Weight: 185 lb 3.2 oz (84.006 kg)  SpO2: 99%   GENERAL- alert, co-operative, appears as stated age, not in any distress. HEENT- Atraumatic, normocephalic, PERRL, EOMI, oral mucosa appears moist, thyroid does not appear enlarged, neck supple. CARDIAC- RRR, no murmurs, rubs or gallops. RESP- Moving equal volumes of air, and clear to auscultation bilaterally, no wheezes  or crackles. ABDOMEN- Soft, bowel sounds present. NEURO- No obvious Cr N abnormality, strenght upper and lower extremities intact, Gait- Normal. EXTREMITIES- warm and well perfused. SKIN- Warm, dry, No rash or lesion. PSYCH- Normal mood and affect, appropriate thought content and speech.  Assessment & Plan:   The patient's case and plan of care was discussed with attending physician, Dr. Dareen Piano.  Cramps- Lab work so far negative for cause. Most likely idiopathic or related to pts poor water intake.   Plan-  - Pt encouraged to increase water intake. - Follow up, if severe consider muscle relaxants. - Check mag and Bmet, Pt on KCL- though low dose 10mg  daily, if pt needs to be on this pill. - Ck levels.  Please see problem based charting for assessment and plan.

## 2013-10-04 NOTE — Patient Instructions (Addendum)
.   General Instructions: For the colace, you can take it once or twice a day, whichever works for you.  Also take lots of water, read the information below about cramps.  We will do some lab work today to be sure your electrolytes are fine.   Thank you for bringing your medicines today. This helps [place] keep you safe from mistakes.    Leg Cramps Leg cramps that occur during exercise can be caused by poor circulation or dehydration. However, muscle cramps that occur at rest or during the night are usually not due to any serious medical problem. Heat cramps may cause muscle spasms during hot weather.  CAUSES There is no clear cause for muscle cramps. However, dehydration may be a factor for those who do not drink enough fluids and those who exercise in the heat. Imbalances in the level of sodium, potassium, calcium or magnesium in the muscle tissue may also be a factor. Some medications, such as water pills (diuretics), may cause loss of chemicals that the body needs (like sodium and potassium) and cause muscle cramps. TREATMENT   Make sure your diet has enough fluids and essential minerals for the muscle to work normally.  Avoid strenuous exercise for several days if you have been having frequent leg cramps.  Stretch and massage the cramped muscle for several minutes.  Some medicines may be helpful in some patients with night cramps. Only take over-the-counter or prescription medicines as directed by your caregiver. SEEK IMMEDIATE MEDICAL CARE IF:   Your leg cramps become worse.  Your foot becomes cold, numb, or blue.

## 2013-10-05 LAB — BASIC METABOLIC PANEL
BUN: 15 mg/dL (ref 6–23)
CHLORIDE: 102 meq/L (ref 96–112)
CO2: 29 mEq/L (ref 19–32)
Calcium: 9.9 mg/dL (ref 8.4–10.5)
Creat: 0.6 mg/dL (ref 0.50–1.10)
GLUCOSE: 87 mg/dL (ref 70–99)
POTASSIUM: 4.3 meq/L (ref 3.5–5.3)
Sodium: 141 mEq/L (ref 135–145)

## 2013-10-05 LAB — MAGNESIUM: Magnesium: 2.1 mg/dL (ref 1.5–2.5)

## 2013-10-05 LAB — CK: Total CK: 55 U/L (ref 7–177)

## 2013-10-06 NOTE — Progress Notes (Signed)
INTERNAL MEDICINE TEACHING ATTENDING ADDENDUM - Elyse Prevo, MD: I reviewed and discussed at the time of visit with the resident Dr. Emokpae, the patient's medical history, physical examination, diagnosis and results of pertinent tests and treatment and I agree with the patient's care as documented.  

## 2013-10-23 ENCOUNTER — Encounter: Payer: BC Managed Care – PPO | Admitting: Internal Medicine

## 2013-10-28 ENCOUNTER — Encounter (HOSPITAL_COMMUNITY): Payer: Self-pay | Admitting: Emergency Medicine

## 2013-10-28 ENCOUNTER — Observation Stay (HOSPITAL_COMMUNITY)
Admission: EM | Admit: 2013-10-28 | Discharge: 2013-10-29 | Disposition: A | Discharge status: Home / Self Care | Payer: BC Managed Care – PPO | Attending: Internal Medicine | Admitting: Internal Medicine

## 2013-10-28 ENCOUNTER — Emergency Department (HOSPITAL_COMMUNITY): Payer: BC Managed Care – PPO

## 2013-10-28 DIAGNOSIS — M79602 Pain in left arm: Secondary | ICD-10-CM

## 2013-10-28 DIAGNOSIS — Z79899 Other long term (current) drug therapy: Secondary | ICD-10-CM | POA: Insufficient documentation

## 2013-10-28 DIAGNOSIS — Z0000 Encounter for general adult medical examination without abnormal findings: Secondary | ICD-10-CM

## 2013-10-28 DIAGNOSIS — I10 Essential (primary) hypertension: Secondary | ICD-10-CM | POA: Insufficient documentation

## 2013-10-28 DIAGNOSIS — E039 Hypothyroidism, unspecified: Secondary | ICD-10-CM | POA: Diagnosis not present

## 2013-10-28 DIAGNOSIS — Z87891 Personal history of nicotine dependence: Secondary | ICD-10-CM | POA: Diagnosis not present

## 2013-10-28 DIAGNOSIS — E669 Obesity, unspecified: Secondary | ICD-10-CM | POA: Insufficient documentation

## 2013-10-28 DIAGNOSIS — Z6835 Body mass index (BMI) 35.0-35.9, adult: Secondary | ICD-10-CM | POA: Insufficient documentation

## 2013-10-28 DIAGNOSIS — E876 Hypokalemia: Principal | ICD-10-CM | POA: Insufficient documentation

## 2013-10-28 DIAGNOSIS — Z9114 Patient's other noncompliance with medication regimen: Secondary | ICD-10-CM | POA: Diagnosis not present

## 2013-10-28 DIAGNOSIS — R0789 Other chest pain: Secondary | ICD-10-CM | POA: Diagnosis present

## 2013-10-28 DIAGNOSIS — E785 Hyperlipidemia, unspecified: Secondary | ICD-10-CM | POA: Diagnosis present

## 2013-10-28 DIAGNOSIS — Z1231 Encounter for screening mammogram for malignant neoplasm of breast: Secondary | ICD-10-CM

## 2013-10-28 LAB — CBC
HEMATOCRIT: 38.1 % (ref 36.0–46.0)
HEMOGLOBIN: 12.3 g/dL (ref 12.0–15.0)
MCH: 26.6 pg (ref 26.0–34.0)
MCHC: 32.3 g/dL (ref 30.0–36.0)
MCV: 82.5 fL (ref 78.0–100.0)
Platelets: 224 10*3/uL (ref 150–400)
RBC: 4.62 MIL/uL (ref 3.87–5.11)
RDW: 14.6 % (ref 11.5–15.5)
WBC: 6.6 10*3/uL (ref 4.0–10.5)

## 2013-10-28 LAB — BASIC METABOLIC PANEL
Anion gap: 17 — ABNORMAL HIGH (ref 5–15)
BUN: 18 mg/dL (ref 6–23)
CO2: 23 meq/L (ref 19–32)
CREATININE: 0.57 mg/dL (ref 0.50–1.10)
Calcium: 9.4 mg/dL (ref 8.4–10.5)
Chloride: 100 mEq/L (ref 96–112)
GFR calc Af Amer: >90 mL/min (ref >90)
GFR calc non Af Amer: >90 mL/min (ref >90)
Glucose, Bld: 133 mg/dL — ABNORMAL HIGH (ref 70–99)
POTASSIUM: 2.7 meq/L — AB (ref 3.7–5.3)
Sodium: 140 mEq/L (ref 137–147)

## 2013-10-28 LAB — I-STAT TROPONIN, ED: Troponin i, poc: 0.01 ng/mL (ref 0.00–0.08)

## 2013-10-28 MED ORDER — POTASSIUM CHLORIDE 10 MEQ/100ML IV SOLN
10.0000 meq | Freq: Once | INTRAVENOUS | Status: AC
  Administered 2013-10-29: 10 meq via INTRAVENOUS
  Filled 2013-10-28: qty 100

## 2013-10-28 MED ORDER — POTASSIUM CHLORIDE CRYS ER 20 MEQ PO TBCR: 40.0000 meq | EXTENDED_RELEASE_TABLET | Freq: Once | ORAL | Status: DC

## 2013-10-28 MED ORDER — POTASSIUM CHLORIDE CRYS ER 20 MEQ PO TBCR
40.0000 meq | EXTENDED_RELEASE_TABLET | Freq: Once | ORAL | Status: AC
  Administered 2013-10-29: 40 meq via ORAL
  Filled 2013-10-28: qty 2

## 2013-10-28 MED ORDER — POTASSIUM CHLORIDE 10 MEQ/100ML IV SOLN
10.0000 meq | Freq: Once | INTRAVENOUS | Status: AC
  Administered 2013-10-29: 10 meq via INTRAVENOUS

## 2013-10-28 MED ORDER — SODIUM CHLORIDE 0.9 % IV BOLUS (SEPSIS)
500.0000 mL | Freq: Once | INTRAVENOUS | Status: AC
  Administered 2013-10-29: 500 mL via INTRAVENOUS

## 2013-10-28 NOTE — ED Notes (Signed)
Notified RN Gennette Pac. And Dr. Roxanne Mins of K+ 2.7 called by lab

## 2013-10-28 NOTE — ED Notes (Signed)
Recent increased anxiety r/t son

## 2013-10-28 NOTE — ED Notes (Signed)
Pt arrives via EMS with c/o chest pain starting last night while in bed around 2300. Took some aspirin and went away, woke up with dull pain that increased throughout the day. 8/10 pain into left arm, no SHOB, no nausea, no diaphoresis. 218/110 initial BP. 3 L nitro, 648 mg PO aspirin prior to EMS arrival. SPO2 100% on room air, 12 ekg NSR.

## 2013-10-28 NOTE — ED Provider Notes (Signed)
CSN: [id number]     Arrival date & time 10/28/13  2232 History   First MD Initiated Contact with Patient 10/28/13 2347     Chief Complaint  Patient presents with  . Chest Pain     (Consider location/radiation/quality/duration/timing/severity/associated sxs/prior Treatment) HPI  Lydia Walsh is a 58 y.o. female with past medical history significant for hypertension, hyperlipidemia, hypothyroid, iron deficiency anemia complaining of left-sided chest pain scribe as aching, 8/10 at worst, radiating to the left arm onset last night. Patient took 2 325 mg aspirin tabs and pain resolved. Pain is exacerbated by movement and palpation, is nonpleuritic and nonexertional. She woke up this [place], she is a nonphysical job and sits. She states that the pain started again. Patient denies shortness of breath, nausea vomiting, diaphoresis. Stitches similar exacerbation over a month ago, she was evaluated by her primary care internal medicine, an EKG was taken and she was sent home. She's been noncompliant with her K. Dur which she thought she only has to take when she has muscle cramps. She has been taking her Lasix regularly. Patient was given 3 nitroglycerin in route which helped relieve  pain, pain is 3/10 right now. She's never had a stress test. He is no family history of cardiac issues. She denies cough, fever chills, abdominal pain, nausea vomiting, change in bowel or bladder habits, history of DVT or PE, recent immobilization, calf pain or leg swelling. Also took 2 325 mg aspirin before she came to ED. Former smoker  Right hand dominant  Past Medical History  Diagnosis Date  . Hyperlipidemia   . Hypertension   . Hypothyroidism   . Anemia     iron deficiency anemia  . Osteoarthritis   . Sleep apnea    Past Surgical History  Procedure Laterality Date  . Myomectomy     No family history on file. History  Substance Use Topics  . Smoking status: Former Research scientist (life sciences)  .  Smokeless tobacco: Not on file     Comment: quit x 25yrs.  . Alcohol Use: No   OB History   Grav Para Term Preterm Abortions TAB SAB Ect Mult Living                 Review of Systems  10 systems reviewed and found to be negative, except as noted in the HPI.   Allergies  Ace inhibitors and Hctz  Home Medications   Prior to Admission medications   Medication Sig Start Date End Date Taking? Authorizing Provider  allopurinol (ZYLOPRIM) 100 MG tablet Take 1 tablet (100 mg total) by mouth daily. 06/02/13  Yes Malena Catholic, MD  amLODipine (NORVASC) 10 MG tablet Take 1 tablet (10 mg total) by mouth daily. 05/02/13  Yes Lesly Dukes, MD  docusate sodium (COLACE) 100 MG capsule Take 100 mg by mouth daily as needed for mild constipation.   Yes Historical Provider, MD  furosemide (LASIX) 20 MG tablet Take 20 mg by mouth daily.   Yes Historical Provider, MD  ibuprofen (ADVIL,MOTRIN) 600 MG tablet Take 600 mg by mouth every 8 (eight) hours as needed for moderate pain.   Yes Historical Provider, MD  levothyroxine (SYNTHROID, LEVOTHROID) 112 MCG tablet Take 1 tablet (112 mcg total) by mouth daily. 06/02/13 06/02/14 Yes Vijaya Mercer Pod, MD  loratadine (CLARITIN) 10 MG tablet Take 10 mg by mouth daily as needed for allergies.   Yes Historical Provider, MD  potassium chloride (K-DUR,KLOR-CON) 10 MEQ  tablet Take 10 mEq by mouth daily as needed (cramping).   Yes Historical Provider, MD  rosuvastatin (CRESTOR) 20 MG tablet Take 1 tablet (20 mg total) by mouth at bedtime. 08/02/13 08/02/14 Yes Vijaya Mercer Pod, MD   BP 144/77  Pulse 88  Temp(Src) 98.4 F (36.9 C) (Oral)  Resp 20  Ht 5' (1.524 m)  Wt 184 lb 4.8 oz (83.598 kg)  BMI 35.99 kg/m2  SpO2 100% Physical Exam  Nursing note and vitals reviewed. Constitutional: She is oriented to person, place, and time. She appears well-developed and well-nourished. No distress.  HENT:  Head: Normocephalic and atraumatic.  Mouth/Throat:  Oropharynx is clear and moist.  Eyes: Conjunctivae and EOM are normal. Pupils are equal, round, and reactive to light.  Neck: Normal range of motion. Neck supple.  Cardiovascular: Normal rate, regular rhythm and intact distal pulses.   Pulmonary/Chest: Effort normal and breath sounds normal. No stridor. No respiratory distress. She has no wheezes. She has no rales.   She exhibits tenderness.    Abdominal: Soft. Bowel sounds are normal. There is no tenderness.  Musculoskeletal: Normal range of motion. She exhibits no edema and no tenderness.  Neurological: She is alert and oriented to person, place, and time.  Psychiatric: She has a normal mood and affect.    ED Course  Procedures (including critical care time) Labs Review Labs Reviewed  BASIC METABOLIC PANEL - Abnormal; Notable for the following:    Potassium 2.7 (*)    Glucose, Bld 133 (*)    Anion gap 17 (*)    All other components within normal limits  CBC - Abnormal; Notable for the following:    Hemoglobin 11.6 (*)    HCT 34.9 (*)    All other components within normal limits  BASIC METABOLIC PANEL - Abnormal; Notable for the following:    Potassium 3.3 (*)    Glucose, Bld 103 (*)    Creatinine, Ser 0.49 (*)    All other components within normal limits  CBC  MAGNESIUM  TROPONIN I  I-STAT TROPOININ, ED    Imaging Review Dg Chest 2 View  10/29/2013   CLINICAL DATA:  Chest pain beginning last night, resolved with aspirin. Recurrent chest pain today radiating to LEFT arm. No shortness of breath.  EXAM: CHEST  2 VIEW  COMPARISON:  Chest radiograph report 06/2006  FINDINGS: The heart size and mediastinal contours are within normal limits. Bronchitic changes. Stable granuloma RIGHT midlung zone. Both lungs are otherwise clear. The visualized skeletal structures are nonsuspicious, mild aorta atherosclerosis.  IMPRESSION: Bronchitic changes without focal consolidation.   Electronically Signed   By: Elon Alas   On:  10/29/2013 00:12     EKG Interpretation   Date/Time:  Saturday October 28 2013 22:40:07 EDT Ventricular Rate:  98 PR Interval:  216 QRS Duration: 76 QT Interval:  348 QTC Calculation: 444 R Axis:   39 Text Interpretation:  Sinus rhythm Prolonged PR interval Probable left  atrial enlargement Baseline wander in lead(s) II III aVL aVF When compared  with ECG of 08/02/2013, No significant change was found Confirmed by [facility]   MD, DAVID ([id number]) on 10/28/2013 10:55:45 PM      MDM   Final diagnoses:  Hypokalemia    Filed Vitals:   10/29/13 0230 10/29/13 0300 10/29/13 0330 10/29/13 0404  BP: 137/81 119/65 130/69 144/77  Pulse: 80 83 84 88  Temp:    98.4 F (36.9 C)  TempSrc:    Oral  Resp:  22 22 20   Height:    5' (1.524 m)  Weight:    184 lb 4.8 oz (83.598 kg)  SpO2: 99% 97% 97% 100%    Medications  rosuvastatin (CRESTOR) tablet 20 mg (not administered)  allopurinol (ZYLOPRIM) tablet 100 mg (not administered)  levothyroxine (SYNTHROID, LEVOTHROID) tablet 112 mcg (112 mcg Oral Given 10/29/13 0610)  amLODipine (NORVASC) tablet 10 mg (not administered)  furosemide (LASIX) tablet 20 mg (not administered)  ibuprofen (ADVIL,MOTRIN) tablet 600 mg (600 mg Oral Given 10/29/13 0437)  loratadine (CLARITIN) tablet 10 mg (not administered)  enoxaparin (LOVENOX) injection 40 mg (not administered)  sodium chloride 0.9 % injection 3 mL (not administered)  sodium chloride 0.9 % bolus 500 mL (500 mLs Intravenous Transfusing/Transfer 10/29/13 0346)  potassium chloride SA (K-DUR,KLOR-CON) CR tablet 40 mEq (40 mEq Oral Given 10/29/13 0004)  potassium chloride 10 mEq in 100 mL IVPB (0 mEq Intravenous Stopped 10/29/13 0346)  potassium chloride 10 mEq in 100 mL IVPB (0 mEq Intravenous Stopped 10/29/13 0131)  0.9 %  sodium chloride infusion ( Intravenous Stopped 10/29/13 0346)  sodium chloride 0.9 % bolus 500 mL (500 mLs Intravenous Transfusing/Transfer 10/29/13 0346)  ketorolac (TORADOL) 30  MG/ML injection 30 mg (30 mg Intravenous Given 10/29/13 0225)    Lydia Walsh is a 58 y.o. female presenting with chest pain onset last night, radiating to the left arm, resolved with aspirin, onset again today. Pain is atypical, she is very tender to palpation along the chest wall and posterior shoulder. Low risk by heart score. Patient is hypokalemic at 2.7. She is noncompliant with her K. Dur. States that she thinks she status post take that when she has muscle cramps. I have explained to her that she needs to take that regularly as directed and that her Lasix makes it important for her to take it daily.  Patient will be admitted to internal medicine teaching service.    Monico Blitz, PA-C 10/29/13 [phone]

## 2013-10-29 DIAGNOSIS — E876 Hypokalemia: Secondary | ICD-10-CM

## 2013-10-29 DIAGNOSIS — E785 Hyperlipidemia, unspecified: Secondary | ICD-10-CM | POA: Diagnosis not present

## 2013-10-29 DIAGNOSIS — R079 Chest pain, unspecified: Secondary | ICD-10-CM

## 2013-10-29 DIAGNOSIS — R0789 Other chest pain: Secondary | ICD-10-CM | POA: Diagnosis present

## 2013-10-29 DIAGNOSIS — I10 Essential (primary) hypertension: Secondary | ICD-10-CM | POA: Diagnosis not present

## 2013-10-29 DIAGNOSIS — M79602 Pain in left arm: Secondary | ICD-10-CM

## 2013-10-29 LAB — BASIC METABOLIC PANEL
Anion gap: 13 (ref 5–15)
BUN: 14 mg/dL (ref 6–23)
CHLORIDE: 105 meq/L (ref 96–112)
CO2: 23 mEq/L (ref 19–32)
Calcium: 8.8 mg/dL (ref 8.4–10.5)
Creatinine, Ser: 0.49 mg/dL — ABNORMAL LOW (ref 0.50–1.10)
GFR calc non Af Amer: >90 mL/min (ref >90)
Glucose, Bld: 103 mg/dL — ABNORMAL HIGH (ref 70–99)
POTASSIUM: 3.3 meq/L — AB (ref 3.7–5.3)
Sodium: 141 mEq/L (ref 137–147)

## 2013-10-29 LAB — CBC
HCT: 34.9 % — ABNORMAL LOW (ref 36.0–46.0)
HEMOGLOBIN: 11.6 g/dL — AB (ref 12.0–15.0)
MCH: 27.3 pg (ref 26.0–34.0)
MCHC: 33.2 g/dL (ref 30.0–36.0)
MCV: 82.1 fL (ref 78.0–100.0)
Platelets: 208 10*3/uL (ref 150–400)
RBC: 4.25 MIL/uL (ref 3.87–5.11)
RDW: 14.6 % (ref 11.5–15.5)
WBC: 6 10*3/uL (ref 4.0–10.5)

## 2013-10-29 LAB — TROPONIN I

## 2013-10-29 LAB — MAGNESIUM: MAGNESIUM: 2 mg/dL (ref 1.5–2.5)

## 2013-10-29 MED ORDER — AMLODIPINE BESYLATE 10 MG PO TABS
10.0000 mg | ORAL_TABLET | Freq: Every day | ORAL | Status: DC
Start: 2013-10-29 — End: 2013-10-29
  Administered 2013-10-29: 10 mg via ORAL
  Filled 2013-10-29: qty 1

## 2013-10-29 MED ORDER — LEVOTHYROXINE SODIUM 112 MCG PO TABS
112.0000 ug | ORAL_TABLET | Freq: Every day | ORAL | Status: DC
  Administered 2013-10-29: 112 ug via ORAL
  Filled 2013-10-29 (×2): qty 1

## 2013-10-29 MED ORDER — POTASSIUM CHLORIDE CRYS ER 20 MEQ PO TBCR: 40.0000 meq | EXTENDED_RELEASE_TABLET | Freq: Every day | ORAL | Status: DC

## 2013-10-29 MED ORDER — ROSUVASTATIN CALCIUM 20 MG PO TABS
20.0000 mg | ORAL_TABLET | Freq: Every day | ORAL | Status: DC
Start: 2013-10-29 — End: 2013-10-29
  Filled 2013-10-29: qty 1

## 2013-10-29 MED ORDER — ALLOPURINOL 100 MG PO TABS
100.0000 mg | ORAL_TABLET | Freq: Every day | ORAL | Status: DC
  Administered 2013-10-29: 100 mg via ORAL
  Filled 2013-10-29: qty 1

## 2013-10-29 MED ORDER — SODIUM CHLORIDE 0.9 % IV SOLN
Freq: Once | INTRAVENOUS | Status: AC
  Administered 2013-10-29: via INTRAVENOUS

## 2013-10-29 MED ORDER — LORATADINE 10 MG PO TABS
10.0000 mg | ORAL_TABLET | Freq: Every day | ORAL | Status: DC | PRN
  Filled 2013-10-29: qty 1

## 2013-10-29 MED ORDER — FUROSEMIDE 20 MG PO TABS
20.0000 mg | ORAL_TABLET | Freq: Every day | ORAL | Status: DC
  Administered 2013-10-29: 20 mg via ORAL
  Filled 2013-10-29: qty 1

## 2013-10-29 MED ORDER — ENOXAPARIN SODIUM 40 MG/0.4ML ~~LOC~~ SOLN
40.0000 mg | SUBCUTANEOUS | Status: DC
  Administered 2013-10-29: 40 mg via SUBCUTANEOUS
  Filled 2013-10-29: qty 0.4

## 2013-10-29 MED ORDER — SODIUM CHLORIDE 0.9 % IV BOLUS (SEPSIS)
500.0000 mL | Freq: Once | INTRAVENOUS | Status: AC
  Administered 2013-10-29: 500 mL via INTRAVENOUS

## 2013-10-29 MED ORDER — IBUPROFEN 600 MG PO TABS
600.0000 mg | ORAL_TABLET | Freq: Three times a day (TID) | ORAL | Status: DC | PRN
  Administered 2013-10-29: 600 mg via ORAL
  Filled 2013-10-29: qty 1

## 2013-10-29 MED ORDER — KETOROLAC TROMETHAMINE 30 MG/ML IJ SOLN
30.0000 mg | Freq: Once | INTRAMUSCULAR | Status: AC
  Administered 2013-10-29: 30 mg via INTRAVENOUS

## 2013-10-29 MED ORDER — POTASSIUM CHLORIDE CRYS ER 20 MEQ PO TBCR
40.0000 meq | EXTENDED_RELEASE_TABLET | Freq: Once | ORAL | Status: AC
  Administered 2013-10-29: 40 meq via ORAL
  Filled 2013-10-29: qty 2

## 2013-10-29 MED ORDER — SODIUM CHLORIDE 0.9 % IJ SOLN
3.0000 mL | Freq: Two times a day (BID) | INTRAMUSCULAR | Status: DC
  Administered 2013-10-29: 3 mL via INTRAVENOUS

## 2013-10-29 NOTE — Discharge Summary (Signed)
Name: Lydia Walsh MRN: [id number] DOB: 08/16/1955 58 y.o. PCP: Dellia Nims, MD  Date of Admission: 10/28/2013 10:32 PM Date of Discharge: 10/29/2013 Attending Physician: Karren Cobble, MD  Discharge Diagnosis:   Musculoskeletal chest and left arm pain   Hypokalemia  Discharge Medications:   Medication List         allopurinol 100 MG tablet  Commonly known as:  ZYLOPRIM  Take 1 tablet (100 mg total) by mouth daily.     amLODipine 10 MG tablet  Commonly known as:  NORVASC  Take 1 tablet (10 mg total) by mouth daily.     docusate sodium 100 MG capsule  Commonly known as:  COLACE  Take 100 mg by mouth daily as needed for mild constipation.     furosemide 20 MG tablet  Commonly known as:  LASIX  Take 20 mg by mouth daily.     ibuprofen 600 MG tablet  Commonly known as:  ADVIL,MOTRIN  Take 600 mg by mouth every 8 (eight) hours as needed for moderate pain.     levothyroxine 112 MCG tablet  Commonly known as:  SYNTHROID, LEVOTHROID  Take 1 tablet (112 mcg total) by mouth daily.     loratadine 10 MG tablet  Commonly known as:  CLARITIN  Take 10 mg by mouth daily as needed for allergies.     potassium chloride SA 20 MEQ tablet  Commonly known as:  K-DUR,KLOR-CON  Take 2 tablets (40 mEq total) by mouth daily.     rosuvastatin 20 MG tablet  Commonly known as:  CRESTOR  Take 1 tablet (20 mg total) by mouth at bedtime.        Disposition and follow-up:   LydiaAarna S Miracle was discharged from [facility] in stable condition.  At the hospital follow up visit please address:  1.  Status of her L arm and chest pain.  2.  Reconsider Lasix as an antihypertensive. As per previous records, patient has had adverse reactions to ACE inhibitors and HCTZ.   3.  Labs / imaging needed at time of follow-up: Check BMP to monitor K.  She was discharged on increased dose of 47mEq per day until she can have K re-checked at hospital follow-up.  The dose can be  adjusted at that time.  4.  Pending labs/ test needing follow-up: none  Follow-up Appointments: will follow up at [facility]   Discharge Instructions: Lydia Walsh, you were admitted for ruling out if the pain you were experiencing in your chest and left arm pain was due to to your heart. Our workup reveals that your pain is not related to your heart but rather related with musculoskeletal pain involving your muscles in your shoulder. Please refrain from carrying heavy weights in your handup and extremely strenous activity. Please take over the counter medications such as ibuprofen or Tylenol to help with reducing the pain and inflammation as needed.   Consultations:   none  Procedures Performed:  Dg Chest 2 View  10/29/2013   CLINICAL DATA:  Chest pain beginning last night, resolved with aspirin. Recurrent chest pain today radiating to LEFT arm. No shortness of breath.  EXAM: CHEST  2 VIEW  COMPARISON:  Chest radiograph report 06/2006  FINDINGS: The heart size and mediastinal contours are within normal limits. Bronchitic changes. Stable granuloma RIGHT midlung zone. Both lungs are otherwise clear. The visualized skeletal structures are nonsuspicious, mild aorta atherosclerosis.  IMPRESSION: Bronchitic changes without focal consolidation.  Electronically Signed   By: Elon Alas   On: 10/29/2013 00:12   Admission HPI: Lydia Walsh is a 58 year old woman, with past medical history of hypertension, hypothyroidism, major depression, and obstructive sleep apnea, who presents to the emergency department with a 2 days of left-sided chest pain. She describes her chest pain as sharp, located anteriorly under her breast and radiating into her left upper extremity. This is the first episode of such pain. She has been carrying her hand bag over her left should lately, and she thought that this could be the cause of her pain. She denies history of trauma or falls to her left arm. She does  not does not remember using her left arm excessively lately. Pain has been constant, but it is relieved by aspirin. However, today, when the pain recurred, she decided to present to the ED because she googled her symptoms and was concerned that she may be having a heart attack. She denies increased shortness of breath, wheezing, cough, palpitations, nausea, or diaphoresis. She has no cardiac history besides a heart murmur. No history of cough, or any URI symptoms. At the time of my evaluation, her pain had almost completely resolved. Initial EKG and troponin in the emergency department were negative for ischemic. However, her BMP revealed a hypokalemia of 2.7. She admits that she has not been compliant with her potassium replacement. IMTS was requested to admit the patient for potassium replacement.   Hospital Course by problem list:  Atypical chest and left arm pain: This was deemed to be musculoskeletal given its reproducible nature and her history of carrying a heavy handbag on her left side. EKG on admission was unremarkable for any evidence of ischemia and troponin were negative. The pain was reproducible on palpation.  The patient's "empty can" and "lift off" exam were negative making rotator cuff pathology less likely. She was advised to reduce the load of her handbag and refrain from strenuous activity.  She was advised to take OTC NSAIDs or Tylenol with food as needed for pain.  Hypokalemia: K was 2.7 and she was supplemented during admission. This was likely due to Lasix and non-compliance with 83mEq daily K supplementation. She thought the K was meant for as needed use when she has muscle cramps. She was instructed to increase to 40 mEq of K daily on discharge and understood to take it daily not as needed.   Because of her allergies to HCTZ and ACEI, Lasix was added to amlodipine in 07/2012 for further BP control.  She may benefit from an alternative agent for BP control.  She will need a repeat  BMP at hospital follow-up to check her K.  Her dose can be adjusted at that time if needed.    Hypertension: Her home medications were continued and BP was stable during hospitalization.   Discharge Vitals:   BP 146/80  Pulse 88  Temp(Src) 98.4 F (36.9 C) (Oral)  Resp 20  Ht 5' (1.524 m)  Wt 83.598 kg (184 lb 4.8 oz)  BMI 35.99 kg/m2  SpO2 100%  Discharge Labs:  Results for orders placed during the hospital encounter of 10/28/13 (from the past 24 hour(s))  CBC     Status: None   Collection Time    10/28/13 11:00 PM      Result Value Ref Range   WBC 6.6  4.0 - 10.5 K/uL   RBC 4.62  3.87 - 5.11 MIL/uL   Hemoglobin 12.3  12.0 - 15.0 g/dL  HCT 38.1  36.0 - 46.0 %   MCV 82.5  78.0 - 100.0 fL   MCH 26.6  26.0 - 34.0 pg   MCHC 32.3  30.0 - 36.0 g/dL   RDW 14.6  11.5 - 15.5 %   Platelets 224  150 - 400 K/uL  BASIC METABOLIC PANEL     Status: Abnormal   Collection Time    10/28/13 11:00 PM      Result Value Ref Range   Sodium 140  137 - 147 mEq/L   Potassium 2.7 (*) 3.7 - 5.3 mEq/L   Chloride 100  96 - 112 mEq/L   CO2 23  19 - 32 mEq/L   Glucose, Bld 133 (*) 70 - 99 mg/dL   BUN 18  6 - 23 mg/dL   Creatinine, Ser 0.57  0.50 - 1.10 mg/dL   Calcium 9.4  8.4 - 10.5 mg/dL   GFR calc non Af Amer >90  >90 mL/min   GFR calc Af Amer >90  >90 mL/min   Anion gap 17 (*) 5 - 15  MAGNESIUM     Status: None   Collection Time    10/28/13 11:01 PM      Result Value Ref Range   Magnesium 2.0  1.5 - 2.5 mg/dL  I-STAT TROPOININ, ED     Status: None   Collection Time    10/28/13 11:09 PM      Result Value Ref Range   Troponin i, poc 0.01  0.00 - 0.08 ng/mL   Comment 3           TROPONIN I     Status: None   Collection Time    10/29/13  5:00 AM      Result Value Ref Range   Troponin I <0.30  <0.30 ng/mL  CBC     Status: Abnormal   Collection Time    10/29/13  5:00 AM      Result Value Ref Range   WBC 6.0  4.0 - 10.5 K/uL   RBC 4.25  3.87 - 5.11 MIL/uL   Hemoglobin 11.6 (*) 12.0  - 15.0 g/dL   HCT 34.9 (*) 36.0 - 46.0 %   MCV 82.1  78.0 - 100.0 fL   MCH 27.3  26.0 - 34.0 pg   MCHC 33.2  30.0 - 36.0 g/dL   RDW 14.6  11.5 - 15.5 %   Platelets 208  150 - 400 K/uL  BASIC METABOLIC PANEL     Status: Abnormal   Collection Time    10/29/13  5:00 AM      Result Value Ref Range   Sodium 141  137 - 147 mEq/L   Potassium 3.3 (*) 3.7 - 5.3 mEq/L   Chloride 105  96 - 112 mEq/L   CO2 23  19 - 32 mEq/L   Glucose, Bld 103 (*) 70 - 99 mg/dL   BUN 14  6 - 23 mg/dL   Creatinine, Ser 0.49 (*) 0.50 - 1.10 mg/dL   Calcium 8.8  8.4 - 10.5 mg/dL   GFR calc non Af Amer >90  >90 mL/min   GFR calc Af Amer >90  >90 mL/min   Anion gap 13  5 - 15    Signed: Josiah Lobo, Med Student 10/29/2013, 11:42 AM   Duwaine Maxin, DO 11/01/2013  Services Ordered on Discharge: None Equipment Ordered on Discharge: None

## 2013-10-29 NOTE — Discharge Instructions (Addendum)
Ms. Freese, you were admitted for ruling out if the pain you were experiencing in your chest and left arm pain was due to to your heart. Our workup reveals that your pain is not related to your heart but rather related with musculoskeletal pain involving your muscles in your shoulder. Please refrain from carrying heavy weights in your handup and extremely strenous activity. Please take over the counter medications such as ibuprofen (up to 800 mg per tablet) or Tylenol to help with reducing the pain and inflammation as needed but do not take for more than 1 week at a time. Take pain meds with food to avoid stomach upset and do not take more than you need for pain control.  Use the lowest dose that works for pain.  You may also apply ice to help with the pain.    Musculoskeletal Pain Musculoskeletal pain is muscle and boney aches and pains. These pains can occur in any part of the body. Your caregiver may treat you without knowing the cause of the pain. They may treat you if blood or urine tests, X-rays, and other tests were normal.  CAUSES There is often not a definite cause or reason for these pains. These pains may be caused by a type of germ (virus). The discomfort may also come from overuse. Overuse includes working out too hard when your body is not fit. Boney aches also come from weather changes. Bone is sensitive to atmospheric pressure changes. HOME CARE INSTRUCTIONS   Ask when your test results will be ready. Make sure you get your test results.  Only take over-the-counter or prescription medicines for pain, discomfort, or fever as directed by your caregiver. If you were given medications for your condition, do not drive, operate machinery or power tools, or sign legal documents for 24 hours. Do not drink alcohol. Do not take sleeping pills or other medications that may interfere with treatment.  Continue all activities unless the activities cause more pain. When the pain lessens, slowly resume  normal activities. Gradually increase the intensity and duration of the activities or exercise.  During periods of severe pain, bed rest may be helpful. Lay or sit in any position that is comfortable.  Putting ice on the injured area.  Put ice in a bag.  Place a towel between your skin and the bag.  Leave the ice on for 15 to 20 minutes, 3 to 4 times a day.  Follow up with your caregiver for continued problems and no reason can be found for the pain. If the pain becomes worse or does not go away, it may be necessary to repeat tests or do additional testing. Your caregiver may need to look further for a possible cause. SEEK IMMEDIATE MEDICAL CARE IF:  You have pain that is getting worse and is not relieved by medications.  You develop chest pain that is associated with shortness or breath, sweating, feeling sick to your stomach (nauseous), or throw up (vomit).  Your pain becomes localized to the abdomen.  You develop any new symptoms that seem different or that concern you. MAKE SURE YOU:   Understand these instructions.  Will watch your condition.  Will get help right away if you are not doing well or get worse. Document Released: 12/29/2004 Document Revised: 03/23/2011 Document Reviewed: 09/02/2012 [facility] Patient Information 2015 [place], [place]. This information is not intended to replace advice given to you by your health care provider. Make sure you discuss any questions you have with your  health care provider. ° °

## 2013-10-29 NOTE — Progress Notes (Addendum)
Tele dc'd, iv dc'd. Kathleen Argue S 1:05 PM   Pt discharged home  Discharge instructions given & reviewed Eduction discussed  Pt discharged via wheelchair with all personal belonging at side. Kathleen Argue S 4:00 PM

## 2013-10-29 NOTE — ED Provider Notes (Signed)
Medical screening examination/treatment/procedure(s) were performed by non-physician practitioner and as supervising physician I was immediately available for consultation/collaboration.   EKG Interpretation   Date/Time:  Saturday October 28 2013 22:40:07 EDT Ventricular Rate:  98 PR Interval:  216 QRS Duration: 76 QT Interval:  348 QTC Calculation: 444 R Axis:   39 Text Interpretation:  Sinus rhythm Prolonged PR interval Probable left  atrial enlargement Baseline wander in lead(s) II III aVL aVF When compared  with ECG of 08/02/2013, No significant change was found Confirmed by [facility]   MD, Carlis Burnsworth ([id number]) on 10/28/2013 10:55:45 PM        Delora Fuel, MD 35/59/74 [id number]

## 2013-10-29 NOTE — H&P (Signed)
Date: 10/29/2013               Patient Name:  Lydia Walsh MRN: [id number]  DOB: 02/26/1955 Age / Sex: 58 y.o., female   PCP: Dellia Nims, MD              Medical Service: Internal Medicine Teaching Service              Attending Physician: Dr. Karren Cobble, MD    First Contact: [place] MS IV Pager: [phone]  Second Contact: Dr. Redmond Pulling Pager: [phone]            After Hours (After 5p/  First Contact Pager: [phone]  weekends / holidays): Second Contact Pager: [phone]   Chief Complaint: Left-sided chest pain.  History of Present Illness: Lydia Walsh is a 58 year old woman, with past medical history of hypertension, hypothyroidism, major depression, and obstructive sleep apnea, who presents to the emergency department with a 2 days of left-sided chest pain. She describes her chest pain as sharp, located anteriorly under her breast and radiating into her left upper extremity. This is the first episode of such pain. She has been carrying her hand bag over her left should lately, and she thought that this could be the cause of her pain. She denies history of trauma or falls to her left arm. She does not does not remember using her left arm excessively lately. Pain has been constant, but it is relieved by aspirin. However, today, when the pain recurred, she decided to present to the ED because she googled her symptoms and was concerned that she may be having a heart attack. She denies increased shortness of breath, wheezing, cough, palpitations, nausea, or diaphoresis. She has no cardiac history besides a heart murmur. No history of cough, or any URI symptoms. At the time of my evaluation, her pain had almost completely resolved. Initial EKG and troponin in the emergency department were negative for ischemic. However, her BMP revealed a hypokalemia of 2.7. She admits that she has not been compliant with her potassium replacement. IMTS was requested to admit the patient for potassium  replacement.  Review of Systems: Constitutional: No fever, chills, diaphoresis, appetite change and fatigue.  HEENT: No URI symptoms. No neck pain or photophobia  Cardiovascular:  no leg swelling. No PND or Orthopnea. Gastrointestinal: No N/V or diarrhea. No abdominal pain. No hematochezia or melena.  Genitourinary: No dysuria, urgency, frequency, hematuria, flank pain and difficulty urinating.  Musculoskeletal: History of bilateral osteoarthritis of the knees. She regular requires injections by sports medicine. no back pain, joint swelling, arthralgias and gait problem.  Skin: No rash and wound. No easy bruising. Neurological: No dizziness, seizures, syncope, weakness, LH, numbness and headaches.  Psychiatric/Behavioral: No SI, mood changes, confusion, nervousness, sleep disturbance and agitation.  Meds: Current Facility-Administered Medications  Medication Dose Route Frequency Provider Last Rate Last Dose  . potassium chloride 10 mEq in 100 mL IVPB  10 mEq Intravenous Once Delora Fuel, MD 263 mL/hr at 10/29/13 0127 10 mEq at 10/29/13 0127   Current Outpatient Prescriptions  Medication Sig Dispense Refill  . allopurinol (ZYLOPRIM) 100 MG tablet Take 1 tablet (100 mg total) by mouth daily.  30 tablet  11  . amLODipine (NORVASC) 10 MG tablet Take 1 tablet (10 mg total) by mouth daily.  30 tablet  11  . docusate sodium (COLACE) 100 MG capsule Take 100 mg by mouth daily as needed for mild constipation.      Marland Kitchen  furosemide (LASIX) 20 MG tablet Take 20 mg by mouth daily.      Marland Kitchen ibuprofen (ADVIL,MOTRIN) 600 MG tablet Take 600 mg by mouth every 8 (eight) hours as needed for moderate pain.      Marland Kitchen levothyroxine (SYNTHROID, LEVOTHROID) 112 MCG tablet Take 1 tablet (112 mcg total) by mouth daily.  90 tablet  3  . loratadine (CLARITIN) 10 MG tablet Take 10 mg by mouth daily as needed for allergies.      . potassium chloride (K-DUR,KLOR-CON) 10 MEQ tablet Take 10 mEq by mouth daily as needed (cramping).       . rosuvastatin (CRESTOR) 20 MG tablet Take 1 tablet (20 mg total) by mouth at bedtime.  30 tablet  11  . [DISCONTINUED] esomeprazole (NEXIUM) 20 MG capsule Take 2 capsules (40 mg total) by mouth daily before breakfast.  30 capsule  3    Allergies: Allergies as of 10/28/2013 - Review Complete 10/28/2013  Allergen Reaction Noted  . Ace inhibitors    . Hctz [hydrochlorothiazide] Rash 07/07/2012   Past Medical History  Diagnosis Date  . Hyperlipidemia   . Hypertension   . Hypothyroidism   . Anemia     iron deficiency anemia  . Osteoarthritis   . Sleep apnea    Past Surgical History  Procedure Laterality Date  . Myomectomy     No family history on file. History   Social History  . Marital Status: Divorced    Spouse Name: N/A    Number of Children: N/A  . Years of Education: N/A   Occupational History  . Not on file.   Social History Main Topics  . Smoking status: Former Research scientist (life sciences)  . Smokeless tobacco: Not on file     Comment: quit x 70yrs.  . Alcohol Use: No  . Drug Use: No  . Sexual Activity: Not on file   Other Topics Concern  . Not on file   Social History Narrative   Lives with her son.  Perimenopausal.      Physical Exam: Blood pressure 154/89, pulse 100, temperature 98.4 F (36.9 C), temperature source Oral, resp. rate 17, height 5' (1.524 m), weight 180 lb (81.647 kg), SpO2 100.00%.  General: well developed, well nourished; no acute distress, cooperative with exam HEENT: Neck is supple. Head is Atraumatic. Normal EOM. Nose/throat: moist mucous membranes, pink gums  Lungs/Chest wall: Left anterior chest wall tenderness without visible rashes. CTA bil, normal work of breathing. Heart: Tachycardic, regular.  No murmurs, rubs, or gallops. No JVD Pulses: radial and dorsalis pedis pulses are 2+ and symmetric  Abdomen: Normal fullness, no rebound, guarding, or rigidity; nl BS; no palpable masses.  Skin: warm, dry, intact, normal turgor, no visible rashes    Extremities: no peripheral edema, clubbing, or cyanosis Neurologic: A&O X3, CN II - XII are grossly intact. Moves all extremities.  Psych: Normal mood and affect. Normal speech and behavior. No agitation   Lab results: Basic Metabolic Panel:  Recent Labs  10/28/13 2300 10/28/13 2301  NA 140  --   K 2.7*  --   CL 100  --   CO2 23  --   GLUCOSE 133*  --   BUN 18  --   CREATININE 0.57  --   CALCIUM 9.4  --   MG  --  2.0   CBC:  Recent Labs  10/28/13 2300  WBC 6.6  HGB 12.3  HCT 38.1  MCV 82.5  PLT 224    Imaging results:  Dg Chest 2 View  10/29/2013   CLINICAL DATA:  Chest pain beginning last night, resolved with aspirin. Recurrent chest pain today radiating to LEFT arm. No shortness of breath.  EXAM: CHEST  2 VIEW  COMPARISON:  Chest radiograph report 06/2006  FINDINGS: The heart size and mediastinal contours are within normal limits. Bronchitic changes. Stable granuloma RIGHT midlung zone. Both lungs are otherwise clear. The visualized skeletal structures are nonsuspicious, mild aorta atherosclerosis.  IMPRESSION: Bronchitic changes without focal consolidation.   Electronically Signed   By: Elon Alas   On: 10/29/2013 00:12    Other results: EKG: normal EKG, normal sinus rhythm. Tachycardia. Prolonged PR interval of 216.  Assessment & Plan by Problem: Principal Problem:   Hypokalemia Active Problems:   Health care maintenance   Atypical chest pain  #Atypical chest pain Clinical history and physical examination and findings are most consistent with musculoskeletal pain. EKG and troponins are negative for cardiac ischemia. TIMI Score is 1 which predicts a 5% cardiac mortality risk. Wells score for PE is 0 which makes PE less likely. Chest x-ray revealed bronchitic changes without focal consolidation. Patient without pulmonary complaints, which makes pneumonia less likely cause of her chest pain. Last echocardiogram 04/02/2010 revealed ejection fraction of 65%  without wall motion abnormalities. Pericarditis remains a differential, and a repeat echocardiogram can be considered if her symptoms do not resolve. - Admit on observation on telemetry - Check troponins x3 - Repeat EKG in a.m. - IV Toradol for chest pain  - consider cardiology consult if concern for cardiac etiology remains.   #Hypokalemia Related to furosemide use without compliance with oral potassium. Potassium level was 2.7 on presentation. Patient received 40 mEq or in the ED, followed by 10 mEq IV.  Magnesium normal. -Check BMP in AM. -Encouraged patient to be compliant with oral potassium.  Hypertension BP was aroung 150/90 in ED. She takes Amlodipine 10 mg daily, and Furosemide 20 mg daily.  -Continue home medications.  Hypothyroidism Last TSH 1.76 on 08/02/2013. -Cont with synthroid.   Code Status: Full  F/E/N: Regular diet   VTE Ppx: Lovenox SQ  Family Communication: Communicated plan of care to the patient. She verbalized understanding.   Dispo: Disposition is deferred at this time, awaiting improvement of current medical problems. Anticipated discharge in approximately 1-2 day(s).   The patient does have a current PCP (Tasrif Ahmed, MD), therefore is require [facility] follow-up after discharge.   The patient does not have transportation limitations that hinder transportation to clinic appointments.  Arman Filter, MD, PhD Internal Medicine Intern Pager: [phone] 10/29/2013,2:59 AM

## 2013-10-29 NOTE — Progress Notes (Signed)
  I have seen and examined the patient, and reviewed the daily progress note by Josiah Lobo, MS4 and discussed the care of the patient with them. Please see my progress note from 10/29/2013 for further details regarding assessment and plan.    Signed:  Francesca Oman, DO 10/29/2013, 12:03 PM

## 2013-10-29 NOTE — H&P (Signed)
Internal Medicine Attending Admission Note Date: 10/29/2013  Patient name: Lydia Walsh Medical record number: [id number] Date of birth: January 11, 1956 Age: 58 y.o. Gender: female  I saw and evaluated the patient. I reviewed the resident's note and I agree with the resident's findings and plan as documented in the resident's note.  Chief Complaint(s): Left-sided chest pain and arm pain.  History - key components related to admission:  Lydia Walsh is a 58 year old woman with a history of hypertension, obstructive sleep apnea, major depression, and hypothyroidism who presents with a two-day history of left-sided chest pain. The pain is characterized as a sharp pain located under her left breast that radiates into her left upper extremity. It responds to aspirin and is not associated with shortness of breath, dizziness, diaphoresis, or nausea. She does admit to carrying a heavy handbag over the left shoulder and initially attributed the pain to that. She decided to "Google" her symptoms and found they could be consistent with a heart attack. She therefore presented to the emergency department for further evaluation. In the emergency department she was found to be hypokalemic and was admitted to the internal medicine teaching service for further evaluation and care.  Physical Exam - key components related to admission:  Filed Vitals:   10/29/13 0300 10/29/13 0330 10/29/13 0404 10/29/13 0959  BP: 119/65 130/69 144/77 146/80  Pulse: 83 84 88   Temp:   98.4 F (36.9 C)   TempSrc:   Oral   Resp: 22 22 20    Height:   5' (1.524 m)   Weight:   184 lb 4.8 oz (83.598 kg)   SpO2: 97% 97% 100%    General: Well-developed, well-nourished, obese woman sitting comfortably in bed in no acute distress. Chest wall: Tender to palpation along the lateral aspect of the anterior left chest that reproduces the pain she has been experiencing. Extremities: Shoulder examination including abduction, external rotation, and  internal rotation are unremarkable and do not reproduce the pain she has been experiencing  Lab results:  Basic Metabolic Panel:  Recent Labs  10/28/13 2300 10/28/13 2301 10/29/13 0500  NA 140  --  141  K 2.7*  --  3.3*  CL 100  --  105  CO2 23  --  23  GLUCOSE 133*  --  103*  BUN 18  --  14  CREATININE 0.57  --  0.49*  CALCIUM 9.4  --  8.8  MG  --  2.0  --    CBC:  Recent Labs  10/28/13 2300 10/29/13 0500  WBC 6.6 6.0  HGB 12.3 11.6*  HCT 38.1 34.9*  MCV 82.5 82.1  PLT 224 208   Cardiac Enzymes:  Recent Labs  10/29/13 0500  TROPONINI <0.30   Imaging results:  Dg Chest 2 View  10/29/2013   CLINICAL DATA:  Chest pain beginning last night, resolved with aspirin. Recurrent chest pain today radiating to LEFT arm. No shortness of breath.  EXAM: CHEST  2 VIEW  COMPARISON:  Chest radiograph report 06/2006  FINDINGS: The heart size and mediastinal contours are within normal limits. Bronchitic changes. Stable granuloma RIGHT midlung zone. Both lungs are otherwise clear. The visualized skeletal structures are nonsuspicious, mild aorta atherosclerosis.  IMPRESSION: Bronchitic changes without focal consolidation.   Electronically Signed   By: Elon Alas   On: 10/29/2013 00:12   Other results:  EKG: Normal sinus rhythm at 98 beats per minute, first degree AV block, normal axis, no significant Q waves, no LVH  by voltage, good R wave progression, nonspecific ST-T changes.  Assessment & Plan by Problem:  Lydia Walsh is a 58 year old woman with a history of hypertension, obstructive sleep apnea, major depression, and hypothyroidism who presents with a two-day history of left-sided chest pain that radiates to the left arm. Her EKG is unremarkable and 2 troponin levels have been negative. Examination can reproduce the pain via palpation of the left anterior chest wall. The rest of the examination was unremarkable. Her history suggests that she's been carrying a heavy handbag  recently over the left shoulder and with the musculoskeletal findings suggest this is musculoskeletal chest wall pain. Her hypokalemia is related to taking the potassium chloride supplementation as needed for muscle cramps, not understanding the directions are to take it daily since she is on Lasix daily. Her risk for these symptoms representing cardiovascular disease is extremely low and she is stable for discharge home today.  1) Chest pain: Chest wall musculoskeletal pain in nature. We will provide her with nonsteroidal anti-inflammatories to be taken on an as-needed basis with food for musculoskeletal pain. We counseled her on the importance of decreasing the weight of her handbag so she is not carrying around a unnecessarily large load.  2) Hypokalemia: Her potassium was repleted while an inpatient. She was instructed to take her potassium chloride on a consistent basis at home and states she will do so from this point forward.  3) Disposition: She is stable for discharge home today and will followup in the [facility] within the next 10-14 days.

## 2013-10-29 NOTE — Progress Notes (Signed)
Subjective: Ms. Gillies was seen and examined this morning.  She is doing well but she does continue to have some left arm pain.  She is breathing well, appetite is good.  Objective: Vital signs in last 24 hours: Filed Vitals:   10/29/13 0300 10/29/13 0330 10/29/13 0404 10/29/13 0959  BP: 119/65 130/69 144/77 146/80  Pulse: 83 84 88   Temp:   98.4 F (36.9 C)   TempSrc:   Oral   Resp: 22 22 20    Height:   5' (1.524 m)   Weight:   83.598 kg (184 lb 4.8 oz)   SpO2: 97% 97% 100%    Weight change:   Intake/Output Summary (Last 24 hours) at 10/29/13 1050 Last data filed at 10/29/13 1022  Gross per 24 hour  Intake    360 ml  Output    425 ml  Net    -65 ml   General: seen ambulating to bathroom, examined sitting up on side of bed HEENT: [place]/AT Cardiac: RRR, no rubs, murmurs or gallops; Pulm: clear to auscultation bilaterally, moving normal volumes of air Abd: soft, nontender, nondistended, BS present MSK:  left anterior chest TTP; mild pain with left arm abduction > 90 degrees but ROM is not limited by pain, no shoulder pain with "empty can" test or "lift off" test Neuro: alert and oriented X3, responding appropriately, ambulating without difficulty, able to move all extremities voluntarily  Lab Results: Basic Metabolic Panel:  Recent Labs Lab 10/28/13 2300 10/28/13 2301 10/29/13 0500  NA 140  --  141  K 2.7*  --  3.3*  CL 100  --  105  CO2 23  --  23  GLUCOSE 133*  --  103*  BUN 18  --  14  CREATININE 0.57  --  0.49*  CALCIUM 9.4  --  8.8  MG  --  2.0  --     CBC:  Recent Labs Lab 10/28/13 2300 10/29/13 0500  WBC 6.6 6.0  HGB 12.3 11.6*  HCT 38.1 34.9*  MCV 82.5 82.1  PLT 224 208   Cardiac Enzymes:  Recent Labs Lab 10/29/13 0500  TROPONINI <0.30   Studies/Results: Dg Chest 2 View  10/29/2013   CLINICAL DATA:  Chest pain beginning last night, resolved with aspirin. Recurrent chest pain today radiating to LEFT arm. No shortness of breath.  EXAM:  CHEST  2 VIEW  COMPARISON:  Chest radiograph report 06/2006  FINDINGS: The heart size and mediastinal contours are within normal limits. Bronchitic changes. Stable granuloma RIGHT midlung zone. Both lungs are otherwise clear. The visualized skeletal structures are nonsuspicious, mild aorta atherosclerosis.  IMPRESSION: Bronchitic changes without focal consolidation.   Electronically Signed   By: Elon Alas   On: 10/29/2013 00:12   Medications: I have reviewed the patient's current medications. Scheduled Meds: . allopurinol  100 mg Oral Daily  . amLODipine  10 mg Oral Daily  . enoxaparin (LOVENOX) injection  40 mg Subcutaneous Q24H  . furosemide  20 mg Oral Daily  . levothyroxine  112 mcg Oral QAC breakfast  . rosuvastatin  20 mg Oral QHS  . sodium chloride  3 mL Intravenous Q12H   Continuous Infusions:  PRN Meds:.ibuprofen, loratadine  Assessment/Plan: 58 yo woman with a PMH of HTN, hypothyroidism, MDD and OSA who presented with 2 day hx of left sided chest pain.  Atypical chest pain:  Ms. Armijo reported left sided chest and arm pain x 2 days.  She does not have  acute ischemic findings on EKG and troponin has been negative.  She has ruled out for ACS.   She has no hypoxia or dyspnea and low Wells score probability for PE.  Her pain is reproducible on exam which is more c/w musculoskeletal etiology.  She has no pain with the "empty can" or "lift off" tests making rotator cuff pathology less likely.  Less likely related to statin use as this is typically symmetric and not unilateral.   She has a heavy purse she carries on her left shoulder most of the time that has likely contributed to non-specific MSK pain.    - she is stable for discharge home with follow-up in [facility] - she was advised to remove some non-essential items from her bag to lighten her load - she can try OTC NSAIDs or Tylenol prn for pain  Hypokalemia:  Likely due to use of Lasix and non-compliance with K supplementation.   She thought the K was meant for prn use when she has muscle cramps. - supplemented with IV and po K - she agrees to resume Kdur daily; I will increase the dose to 29mEq daily until she can be seen in clinic next week for repeat BMP  Hypertension:  Stable.   - continue home meds   Hypothyroidism:  Stable - continue Synthroid  Code Status: Full  Dispo: This pain does not appear to be cardiac in nature.  She was given advice for pain relief.  She is stable for discharge home with follow-up in [facility] next week.  The patient does have a current PCP (Tasrif Ahmed, MD) and does need an [facility] hospital follow-up appointment after discharge.  The patient does not have transportation limitations that hinder transportation to clinic appointments.  .Services Needed at time of discharge: Y = Yes, Blank = No PT:   OT:   RN:   Equipment:   Other:     LOS: 1 day   Francesca Oman, DO 10/29/2013, 10:50 AM

## 2013-10-29 NOTE — Progress Notes (Signed)
Subjective: Ms. Beever was seen in her room today. She was walking when the team entered. She still reports of left arm pain but denies any chest pain. She admitted that she thought that she was supposed to take the potassium replacement tablets as needed for her cramps as opposed to everyday.   Objective: Vital signs in last 24 hours: Filed Vitals:   10/29/13 0300 10/29/13 0330 10/29/13 0404 10/29/13 0959  BP: 119/65 130/69 144/77 146/80  Pulse: 83 84 88   Temp:   98.4 F (36.9 C)   TempSrc:   Oral   Resp: 22 22 20    Height:   5' (1.524 m)   Weight:   83.598 kg (184 lb 4.8 oz)   SpO2: 97% 97% 100%    Weight change:   Intake/Output Summary (Last 24 hours) at 10/29/13 1134 Last data filed at 10/29/13 1022  Gross per 24 hour  Intake    360 ml  Output    425 ml  Net    -65 ml   General: well developed, well nourished; no acute distress, cooperative with exam  Lungs/Chest wall: Left anterior chest wall tenderness without visible rashes. CTA bil, normal work of breathing.  Heart: RR. No murmurs, rubs, or gallops. No JVD  Pulses: radial and dorsalis pedis pulses are 2+ and symmetric  Extremities: no peripheral edema, clubbing, or cyanosis  MSK:  Negative empty can test, negative Neer's sign, no pain on bilateral abduction or adduction of shoulder   Lab Results: Results for ITALI, MCKENDRY (MRN [id number]) as of 10/29/2013 11:12  Ref. Range 10/29/2013 05:00  Sodium Latest Range: 137-147 mEq/L 141  Potassium Latest Range: 3.7-5.3 mEq/L 3.3 (L)  Chloride Latest Range: 96-112 mEq/L 105  CO2 Latest Range: 19-32 mEq/L 23  BUN Latest Range: 6-23 mg/dL 14  Creatinine Latest Range: 0.50-1.10 mg/dL 0.49 (L)  Calcium Latest Range: 8.4-10.5 mg/dL 8.8  GFR calc non Af Amer Latest Range: >90 mL/min >90  GFR calc Af Amer Latest Range: >90 mL/min >90  Glucose Latest Range: 70-99 mg/dL 103 (H)  Anion gap Latest Range: 5-15  13  Troponin I Latest Range: <0.30 ng/mL <0.30  WBC Latest Range:  4.0-10.5 K/uL 6.0  RBC Latest Range: 3.87-5.11 MIL/uL 4.25  Hemoglobin Latest Range: 12.0-15.0 g/dL 11.6 (L)  HCT Latest Range: 36.0-46.0 % 34.9 (L)  MCV Latest Range: 78.0-100.0 fL 82.1  MCH Latest Range: 26.0-34.0 pg 27.3  MCHC Latest Range: 30.0-36.0 g/dL 33.2  RDW Latest Range: 11.5-15.5 % 14.6  Platelets Latest Range: 150-400 K/uL 208   Studies/Results: Dg Chest 2 View  10/29/2013   CLINICAL DATA:  Chest pain beginning last night, resolved with aspirin. Recurrent chest pain today radiating to LEFT arm. No shortness of breath.  EXAM: CHEST  2 VIEW  COMPARISON:  Chest radiograph report 06/2006  FINDINGS: The heart size and mediastinal contours are within normal limits. Bronchitic changes. Stable granuloma RIGHT midlung zone. Both lungs are otherwise clear. The visualized skeletal structures are nonsuspicious, mild aorta atherosclerosis.  IMPRESSION: Bronchitic changes without focal consolidation.   Electronically Signed   By: Elon Alas   On: 10/29/2013 00:12   Medications:  Scheduled Meds: . allopurinol  100 mg Oral Daily  . amLODipine  10 mg Oral Daily  . enoxaparin (LOVENOX) injection  40 mg Subcutaneous Q24H  . furosemide  20 mg Oral Daily  . levothyroxine  112 mcg Oral QAC breakfast  . rosuvastatin  20 mg Oral QHS  . sodium chloride  3 mL Intravenous Q12H   Continuous Infusions:  PRN Meds:.ibuprofen, loratadine Assessment/Plan: Principal Problem:   Hypokalemia Active Problems:   Dyslipidemia   Health care maintenance   Atypical chest pain   Arm pain, left  Ms. Trenkamp is a 58 year old female with a past medical history of hypertension, hypothyroidism, major depression, and obstructive sleep apnea, presenting with chest and L arm pain and found to have hypokalemia.   #Atypical chest pain: Patient denies any chest pain today but reproducible to palpation on exam indicating this is likely musculoskeletal in etiology. Troponin have been negative x 2 (POC and serum). EKG  is not concerning for ischemia or infarction. TIMI Score is 1 which predicts a 5% cardiac mortality risk. Wells score for PE is 0 which makes PE less likely. Chest x-ray revealed bronchitic changes without focal consolidation. Patient without pulmonary complaints, which makes pneumonia less likely cause of her chest pain. Last echocardiogram 04/02/2010 revealed ejection fraction of 65% without wall motion abnormalities. She is stable for discharge today.  - discontinue telemetry - Check troponins x 2  - IV Toradol for chest pain   #Left arm pain: Patient continued to report of L arm pain today and given the physical exam was not concerning for rotator cuff as well as nerve impingement but more so muscular in etiology. Given patient's history of statin-induced myalgia, this is to be considered although myalgias of this nature present as proximal and symmetrical. --recommended NSAIDs for pain given patient's Cr is at 0.58  --reconsider statin choice as patient has history of cramps due to statins in the past   #Hypokalemia: latest level this morning (0500) at 3.3 from 2.7 yesterday. This is after 40 mEq followed by 10 mEq IV. Magnesium is normal. Patient reported that she was taking her K-Dur as needed for cramps and misunderstood rather than taking it everyday as she is on Lasix. Patient says that she will take it everyday.  --Continue K-Dur  #Dyslipidemia: stable although patient started on Crestor and due to finances was switched to pravastatin but then switched back to Crestor three months ago  --continue home Crestor   #Hypertension: BP continue to be 140s/80s.  -continue home medication of Amlodipine 10 mg daily and Furosemide 20 mg daily.  -recommend PCP to reconsider Lasix   #Hypothyroidism: Last TSH 1.76 on 08/02/2013.  -Cont with home synthroid.   Code Status: Full   F/E/N: Regular diet   VTE Ppx: Lovenox SQ  Disposition: discharge to home today   This is a Careers information officer Note.   The care of the patient was discussed with Dr. Eppie Gibson and Dr. Redmond Pulling and the assessment and plan formulated with their assistance.  Please see their attached note for official documentation of the daily encounter.   LOS: 1 day   Josiah Lobo, Med Student 10/29/2013, 11:34 AM

## 2013-10-29 NOTE — Progress Notes (Addendum)
IM teaching paged, pt request for script for stronger pain medication before being discharged. MD to modify AVS. Sherrie Mustache 12:59 PM    a/w MD to sign discharge note and okay to dc'd per verbal order. Sherrie Mustache 2:33 PM

## 2013-10-29 NOTE — Progress Notes (Signed)
Utilization Review Completed.   Caydence Koenig, RN, BSN Nurse Case Manager  

## 2013-10-30 ENCOUNTER — Encounter: Payer: Self-pay | Admitting: Internal Medicine

## 2013-11-08 ENCOUNTER — Ambulatory Visit (INDEPENDENT_AMBULATORY_CARE_PROVIDER_SITE_OTHER): Payer: BLUE CROSS/BLUE SHIELD | Admitting: Internal Medicine

## 2013-11-08 ENCOUNTER — Encounter: Payer: Self-pay | Admitting: Internal Medicine

## 2013-11-08 VITALS — BP 142/74 | HR 95 | Temp 97.8°F | Ht 60.0 in | Wt 184.8 lb

## 2013-11-08 DIAGNOSIS — I10 Essential (primary) hypertension: Secondary | ICD-10-CM

## 2013-11-08 DIAGNOSIS — E876 Hypokalemia: Secondary | ICD-10-CM | POA: Diagnosis not present

## 2013-11-08 DIAGNOSIS — M79602 Pain in left arm: Secondary | ICD-10-CM | POA: Diagnosis not present

## 2013-11-08 DIAGNOSIS — E785 Hyperlipidemia, unspecified: Secondary | ICD-10-CM | POA: Diagnosis not present

## 2013-11-08 LAB — BASIC METABOLIC PANEL WITH GFR
BUN: 13 mg/dL (ref 6–23)
CO2: 25 mEq/L (ref 19–32)
Calcium: 9.7 mg/dL (ref 8.4–10.5)
Chloride: 104 mEq/L (ref 96–112)
Creat: 0.62 mg/dL (ref 0.50–1.10)
GFR, Est Non African American: >89 mL/min
GLUCOSE: 98 mg/dL (ref 70–99)
POTASSIUM: 4.3 meq/L (ref 3.5–5.3)
SODIUM: 141 meq/L (ref 135–145)

## 2013-11-08 LAB — LIPID PANEL
CHOLESTEROL: 180 mg/dL (ref 0–200)
HDL: 61 mg/dL (ref >39)
LDL Cholesterol: 101 mg/dL — ABNORMAL HIGH (ref 0–99)
TRIGLYCERIDES: 91 mg/dL (ref <150)
Total CHOL/HDL Ratio: 3 Ratio
VLDL: 18 mg/dL (ref 0–40)

## 2013-11-08 MED ORDER — SPIRONOLACTONE 25 MG PO TABS: 25.0000 mg | ORAL_TABLET | Freq: Every day | ORAL | Status: DC

## 2013-11-08 NOTE — Progress Notes (Signed)
Patient ID: Lydia Walsh, female   DOB: 09-Aug-1955, 58 y.o.   MRN: [id number]    Subjective:   Patient ID: Lydia Walsh female    DOB: 05-Nov-1955 58 y.o.    MRN: [id number] Health Maintenance Due: Health Maintenance Due  Topic Date Due  . Pap Smear  08/15/1971  . Influenza Vaccine  08/12/2013  . Zostavax  08/14/2013    _________________________________________________  HPI: [name] is a 58 y.o. female here for a hospital follow up visit.  Pt has a PMH outlined below.  Please see problem-based charting assessment and plan note for further details of medical issues addressed at today's visit.  PMH: Past Medical History  Diagnosis Date  . Hyperlipidemia   . Hypertension   . Hypothyroidism   . Anemia     iron deficiency anemia  . Osteoarthritis   . Sleep apnea     Medications: Current Outpatient Prescriptions on File Prior to Visit  Medication Sig Dispense Refill  . allopurinol (ZYLOPRIM) 100 MG tablet Take 1 tablet (100 mg total) by mouth daily.  30 tablet  11  . amLODipine (NORVASC) 10 MG tablet Take 1 tablet (10 mg total) by mouth daily.  30 tablet  11  . docusate sodium (COLACE) 100 MG capsule Take 100 mg by mouth daily as needed for mild constipation.      . furosemide (LASIX) 20 MG tablet Take 20 mg by mouth daily.      Marland Kitchen ibuprofen (ADVIL,MOTRIN) 600 MG tablet Take 600 mg by mouth every 8 (eight) hours as needed for moderate pain.      Marland Kitchen levothyroxine (SYNTHROID, LEVOTHROID) 112 MCG tablet Take 1 tablet (112 mcg total) by mouth daily.  90 tablet  3  . loratadine (CLARITIN) 10 MG tablet Take 10 mg by mouth daily as needed for allergies.      . potassium chloride (K-DUR,KLOR-CON) 20 MEQ tablet Take 2 tablets (40 mEq total) by mouth daily.  60 tablet  0  . rosuvastatin (CRESTOR) 20 MG tablet Take 1 tablet (20 mg total) by mouth at bedtime.  30 tablet  11  . [DISCONTINUED] esomeprazole (NEXIUM) 20 MG capsule Take 2 capsules (40 mg total) by mouth daily before  breakfast.  30 capsule  3   No current facility-administered medications on file prior to visit.    Allergies: Allergies  Allergen Reactions  . Ace Inhibitors     REACTION: itching  . Hctz [Hydrochlorothiazide] Rash    FH: No family history on file.  SH: History   Social History  . Marital Status: Divorced    Spouse Name: N/A    Number of Children: N/A  . Years of Education: N/A   Social History Main Topics  . Smoking status: Former Research scientist (life sciences)  . Smokeless tobacco: Not on file     Comment: quit x 61yrs.  . Alcohol Use: No  . Drug Use: No  . Sexual Activity: Not on file   Other Topics Concern  . Not on file   Social History Narrative   Lives with her son.  Perimenopausal.    Review of Systems: Constitutional: Negative for fever, chills and weight loss.  Eyes: Negative for blurred vision.  Respiratory: Negative for cough and shortness of breath.  Cardiovascular: Negative for chest pain, palpitations and leg swelling.  Gastrointestinal: Negative for nausea, vomiting, abdominal pain, diarrhea, constipation and blood in stool.  Genitourinary: Negative for dysuria, urgency and frequency.  Musculoskeletal: Negative for myalgias and back pain.  Neurological: Negative for dizziness, weakness and headaches.     Objective:   Vital Signs: There were no vitals filed for this visit.    BP Readings from Last 3 Encounters:  10/29/13 146/80  10/04/13 128/79  08/02/13 147/84    Physical Exam: Constitutional: Vital signs reviewed.  Patient is well-developed and well-nourished in NAD and cooperative with exam.  Head: Normocephalic and atraumatic. Eyes: PERRL, EOMI, conjunctivae nl, no scleral icterus.  Neck: Supple. Cardiovascular: RRR, no MRG. Pulmonary/Chest: normal effort, non-tender to palpation, CTAB, no wheezes, rales, or rhonchi. Abdominal: Soft. NT/ND +BS. Musculoskeletal: Full range ofmotion. no pain,edema,or deformity.   Nocyanosis,clubbing,oredema. Neurological: A&O x3, cranial nerves II-XII are grossly intact, moving all extremities. Extremities: 2+DP b/l; no pitting edema. Skin: Warm, dry and intact. No rash.   Assessment & Plan:   Assessment and plan was discussed and formulated with my attending.

## 2013-11-08 NOTE — Assessment & Plan Note (Signed)
BP 142/74.  Currently on lasix 20mg  and amlodipine 10mg  daily.  Also with hypokalemia on supplementation of 20mEq daily.  Pt reports noncompliance with potassium supplementation and doesn't like taking such large pills and wants to be switched to the smaller pills. -d/c lasix due to likely contributing to hypokalemia  -d/c potassium supplementation  -continue amlodipine 10mg  and add spironolactone 25mg  daily to help with potassium and compliance -return to clinic in 1 week for follow up of BP and potassium level

## 2013-11-08 NOTE — Assessment & Plan Note (Signed)
Pt reports left arm pain that has continued despite having been on potassium supplementation for muscle cramps since hospitalization.  She denies any heavy lifting or other injury.  TSH checked and wnl.  Possible etiologies include hypokalemia and also statin myopathy.  I asked her if her arm pain began after taking crestor and she stated that it may have.  She used to be on lipitor and it is unclear as to why she was switched to crestor.   -would discontinue crestor for a trial of 4-6 weeks at her next OV to see if her myalgia improves -check BMP today -d/c lasix and potassium supplementatoin today and added spironolactone for better compliance  -return to clinic in 1 week to follow up

## 2013-11-08 NOTE — Progress Notes (Signed)
Patient ID: Lydia Walsh, female   DOB: 10-13-1955, 58 y.o.   MRN: [id number]  Case discussed with Dr. Gordy Levan soon after the resident saw the patient.  We reviewed the resident's history and exam and pertinent patient test results.  I agree with the assessment, diagnosis, and plan of care documented in the resident's note.

## 2013-11-08 NOTE — Patient Instructions (Addendum)
Thank you for your visit today.   Please return to the internal medicine clinic in 1 week or sooner if needed to recheck your blood pressure and potassium.  Please discontinue taking potassium supplements. Also do not take lasix. I have sent a prescription for spironolactone for your blood pressure instead of lasix.   We will check your potassium today.    Your current medical regimen is effective;  continue present plan and take all medications as prescribed.    Please be sure to bring all of your medications with you to every visit; this includes herbal supplements, vitamins, eye drops, and any over-the-counter medications.   Should you have any questions regarding your medications and/or any new or worsening symptoms, please be sure to call the clinic at [phone].   If you believe that you are suffering from a life threatening condition or one that may result in the loss of limb or function, then you should call 911 or proceed to the nearest Emergency Department.     A healthy lifestyle and preventative care can promote health and wellness.   Maintain regular health, dental, and eye exams.  Eat a healthy diet. Foods like vegetables, fruits, whole grains, low-fat dairy products, and lean protein foods contain the nutrients you need without too many calories. Decrease your intake of foods high in solid fats, added sugars, and salt. Get information about a proper diet from your caregiver, if necessary.  Regular physical exercise is one of the most important things you can do for your health. Most adults should get at least 150 minutes of moderate-intensity exercise (any activity that increases your heart rate and causes you to sweat) each week. In addition, most adults need muscle-strengthening exercises on 2 or more days a week.   Maintain a healthy weight. The body mass index (BMI) is a screening tool to identify possible weight problems. It provides an estimate of body fat based  on height and weight. Your caregiver can help determine your BMI, and can help you achieve or maintain a healthy weight. For adults 20 years and older:  A BMI below 18.5 is considered underweight.  A BMI of 18.5 to 24.9 is normal.  A BMI of 25 to 29.9 is considered overweight.  A BMI of 30 and above is considered obese.

## 2013-11-10 NOTE — Addendum Note (Signed)
Addended by: Madilyn Fireman on: 11/10/2013 11:29 AM   Modules accepted: Orders

## 2013-11-15 ENCOUNTER — Other Ambulatory Visit: Payer: BC Managed Care – PPO

## 2013-11-16 ENCOUNTER — Other Ambulatory Visit (INDEPENDENT_AMBULATORY_CARE_PROVIDER_SITE_OTHER): Payer: BC Managed Care – PPO

## 2013-11-16 ENCOUNTER — Telehealth: Payer: Self-pay | Admitting: Internal Medicine

## 2013-11-16 ENCOUNTER — Other Ambulatory Visit: Payer: Self-pay | Admitting: Internal Medicine

## 2013-11-16 DIAGNOSIS — E876 Hypokalemia: Secondary | ICD-10-CM

## 2013-11-16 DIAGNOSIS — I10 Essential (primary) hypertension: Secondary | ICD-10-CM

## 2013-11-16 LAB — BASIC METABOLIC PANEL WITH GFR
BUN: 14 mg/dL (ref 6–23)
CALCIUM: 9.8 mg/dL (ref 8.4–10.5)
CO2: 25 mEq/L (ref 19–32)
Chloride: 100 mEq/L (ref 96–112)
Creat: 0.64 mg/dL (ref 0.50–1.10)
GFR, Est African American: >89 mL/min
GFR, Est Non African American: >89 mL/min
GLUCOSE: 112 mg/dL — AB (ref 70–99)
Potassium: 3.3 mEq/L — ABNORMAL LOW (ref 3.5–5.3)
Sodium: 141 mEq/L (ref 135–145)

## 2013-11-16 NOTE — Telephone Encounter (Signed)
Called patient re: low K of 3.3.  She was taken off of her K supplementation. She reports having a bottle of 90mEq potassium at home. I asked her to take 20 mEq for 1 week and come back for repeat blood work.   She reported understanding.  BMET placed.   Gilles Chiquito, MD

## 2013-11-17 ENCOUNTER — Other Ambulatory Visit: Payer: BC Managed Care – PPO

## 2013-11-27 ENCOUNTER — Encounter: Payer: BC Managed Care – PPO | Admitting: Internal Medicine

## 2013-11-30 ENCOUNTER — Ambulatory Visit: Payer: BC Managed Care – PPO | Admitting: Internal Medicine

## 2013-12-01 ENCOUNTER — Encounter: Payer: Self-pay | Admitting: Internal Medicine

## 2013-12-01 ENCOUNTER — Ambulatory Visit (INDEPENDENT_AMBULATORY_CARE_PROVIDER_SITE_OTHER): Payer: BLUE CROSS/BLUE SHIELD | Admitting: Internal Medicine

## 2013-12-01 VITALS — BP 130/68 | HR 91 | Temp 98.2°F | Ht 60.0 in | Wt 184.6 lb

## 2013-12-01 DIAGNOSIS — E876 Hypokalemia: Secondary | ICD-10-CM | POA: Insufficient documentation

## 2013-12-01 DIAGNOSIS — M79602 Pain in left arm: Secondary | ICD-10-CM

## 2013-12-01 DIAGNOSIS — I10 Essential (primary) hypertension: Secondary | ICD-10-CM | POA: Diagnosis not present

## 2013-12-01 DIAGNOSIS — Z0000 Encounter for general adult medical examination without abnormal findings: Secondary | ICD-10-CM

## 2013-12-01 NOTE — Patient Instructions (Signed)
1. I will call you if there are problems with your labs or if we need to change meds.   2. Please take all medications as prescribed.    3. If you have worsening of your symptoms or new symptoms arise, please call the clinic ([phone]), or go to the ER immediately if symptoms are severe.  Thank you for bringing your medicines today. This helps [place] keep you safe from mistakes.  Return to clinic to see Dr. [name] in 3 months or sooner if needed.

## 2013-12-01 NOTE — Assessment & Plan Note (Addendum)
BP Readings from Last 3 Encounters:  12/01/13 130/68  11/08/13 142/74  10/29/13 146/80    Lab Results  Component Value Date   NA 141 11/16/2013   K 3.3* 11/16/2013   CREATININE 0.64 11/16/2013    Assessment: Blood pressure control:  well controlled Progress toward BP goal:   at goal  Plan: Medications:  continue current medications: amlodipine 10mg  daily and spironolactone 25mg  daily Educational resources provided: brochure, handout, video   Other plans:  BMP today.  Follow-up with PCP in 3 months.

## 2013-12-01 NOTE — Assessment & Plan Note (Addendum)
Previously on Lasix and was only taking K supplement prn for cramps.  Lasix d/c at hospital follow-up last month and she was started on spironolactone and 20 mEQ K supplement was continued. - continue spironolactone - continue K supplement for now; will check labs today and determine need for continued supplementation now that she is off Lasix and on K-sparing diuretic  ADDENDUM:  K 4.6 on spironolactone and 47mEq K supplement.  I have advised the patient to stop K supplement and to continue spironolactone.  She agrees to come in for BMP next week (week of 11/30) to ensure that K is stable on spironolactone alone.

## 2013-12-01 NOTE — Progress Notes (Signed)
   Subjective:    Patient ID: Lydia Walsh, female    DOB: [birth year], 58 y.o.   MRN: [id number]  HPI Comments: Ms. Renner is a 58 yo F with a PMH of HTN, OSA, OA, GERD and hypothyroidism here for follow-up of hypokalemia.   She is doing well.  Please see problem oriented charting for update.     Review of Systems  Constitutional: Negative for fever, chills and appetite change.  Eyes: Negative for visual disturbance.  Respiratory: Negative for cough and shortness of breath.   Cardiovascular: Negative for chest pain and leg swelling.  Gastrointestinal: Negative for nausea, vomiting, abdominal pain, diarrhea, constipation and blood in stool.  Genitourinary: Negative for dysuria and hematuria.  Musculoskeletal: Positive for arthralgias.       Chronic B/L knee pain due to OA (sees sports medicine)  Neurological: Negative for syncope, weakness, light-headedness and headaches.       Objective:   Physical Exam  Constitutional: She is oriented to person, place, and time. She appears well-developed. No distress.  HENT:  Head: Normocephalic and atraumatic.  Mouth/Throat: Oropharynx is clear and moist. No oropharyngeal exudate.  Eyes: EOM are normal. Pupils are equal, round, and reactive to light.  Cardiovascular: Normal rate and regular rhythm.  Exam reveals no gallop and no friction rub.   Murmur heard. Pulmonary/Chest: Effort normal and breath sounds normal. No respiratory distress. She has no wheezes. She has no rales.  Abdominal: Soft. Bowel sounds are normal. She exhibits no distension. There is no tenderness. There is no rebound.  Musculoskeletal: Normal range of motion. She exhibits no edema or tenderness.  Neurological: She is alert and oriented to person, place, and time. No cranial nerve deficit.  Skin: Skin is warm. She is not diaphoretic.  Psychiatric: She has a normal mood and affect. Her behavior is normal.  Vitals reviewed.         Assessment & Plan:  Please see  problem based assessment and plan.

## 2013-12-01 NOTE — Assessment & Plan Note (Addendum)
Arm pain has resolved.  She has continued Crestor so less likely statin myopathy.  Perhaps arm pain has improved with K supplement. - BMP today - continue Crestor - if arm pain returns she will return to clinic and consider stopping Crestor

## 2013-12-01 NOTE — Assessment & Plan Note (Signed)
She declines flu shot today.  She would like to wait to get her flu shot at her next visit.

## 2013-12-02 LAB — BASIC METABOLIC PANEL WITH GFR
BUN: 21 mg/dL (ref 6–23)
CO2: 25 meq/L (ref 19–32)
Calcium: 9.5 mg/dL (ref 8.4–10.5)
Chloride: 103 mEq/L (ref 96–112)
Creat: 0.67 mg/dL (ref 0.50–1.10)
GFR, Est Non African American: >89 mL/min
Glucose, Bld: 104 mg/dL — ABNORMAL HIGH (ref 70–99)
Potassium: 4.6 mEq/L (ref 3.5–5.3)
Sodium: 141 mEq/L (ref 135–145)

## 2013-12-02 NOTE — Progress Notes (Signed)
Internal Medicine Clinic Attending  Case discussed with Dr. Wilson soon after the resident saw the patient.  We reviewed the resident's history and exam and pertinent patient test results.  I agree with the assessment, diagnosis, and plan of care documented in the resident's note.  

## 2013-12-04 ENCOUNTER — Other Ambulatory Visit: Payer: Self-pay | Admitting: Internal Medicine

## 2013-12-06 ENCOUNTER — Other Ambulatory Visit: Payer: Self-pay | Admitting: Internal Medicine

## 2013-12-06 DIAGNOSIS — E876 Hypokalemia: Secondary | ICD-10-CM

## 2013-12-21 ENCOUNTER — Other Ambulatory Visit: Payer: Self-pay | Admitting: *Deleted

## 2013-12-22 MED ORDER — IBUPROFEN 600 MG PO TABS: 600.0000 mg | ORAL_TABLET | Freq: Three times a day (TID) | ORAL | Status: DC | PRN

## 2014-01-09 ENCOUNTER — Other Ambulatory Visit: Payer: Self-pay | Admitting: Internal Medicine

## 2014-01-09 NOTE — Telephone Encounter (Signed)
Patient's pcp unavailable-refill request sent to [facility] attending pool.  

## 2014-03-02 ENCOUNTER — Other Ambulatory Visit: Payer: Self-pay | Admitting: *Deleted

## 2014-03-02 MED ORDER — SPIRONOLACTONE 25 MG PO TABS: 25.0000 mg | ORAL_TABLET | Freq: Every day | ORAL | Status: DC

## 2014-03-22 ENCOUNTER — Encounter: Payer: Self-pay | Admitting: *Deleted

## 2014-03-31 ENCOUNTER — Encounter (HOSPITAL_COMMUNITY): Payer: Self-pay | Admitting: *Deleted

## 2014-03-31 ENCOUNTER — Emergency Department (HOSPITAL_COMMUNITY): Payer: BC Managed Care – PPO

## 2014-03-31 ENCOUNTER — Emergency Department (HOSPITAL_COMMUNITY)
Admission: EM | Admit: 2014-03-31 | Discharge: 2014-03-31 | Disposition: A | Discharge status: Home / Self Care | Payer: BC Managed Care – PPO | Attending: Emergency Medicine | Admitting: Emergency Medicine

## 2014-03-31 DIAGNOSIS — E785 Hyperlipidemia, unspecified: Secondary | ICD-10-CM | POA: Insufficient documentation

## 2014-03-31 DIAGNOSIS — Z87891 Personal history of nicotine dependence: Secondary | ICD-10-CM | POA: Insufficient documentation

## 2014-03-31 DIAGNOSIS — Z862 Personal history of diseases of the blood and blood-forming organs and certain disorders involving the immune mechanism: Secondary | ICD-10-CM | POA: Insufficient documentation

## 2014-03-31 DIAGNOSIS — M1990 Unspecified osteoarthritis, unspecified site: Secondary | ICD-10-CM | POA: Insufficient documentation

## 2014-03-31 DIAGNOSIS — Z79899 Other long term (current) drug therapy: Secondary | ICD-10-CM | POA: Insufficient documentation

## 2014-03-31 DIAGNOSIS — I10 Essential (primary) hypertension: Secondary | ICD-10-CM | POA: Insufficient documentation

## 2014-03-31 DIAGNOSIS — E039 Hypothyroidism, unspecified: Secondary | ICD-10-CM | POA: Insufficient documentation

## 2014-03-31 DIAGNOSIS — R1011 Right upper quadrant pain: Secondary | ICD-10-CM

## 2014-03-31 DIAGNOSIS — K829 Disease of gallbladder, unspecified: Secondary | ICD-10-CM | POA: Insufficient documentation

## 2014-03-31 DIAGNOSIS — Z8669 Personal history of other diseases of the nervous system and sense organs: Secondary | ICD-10-CM | POA: Insufficient documentation

## 2014-03-31 LAB — CBC WITH DIFFERENTIAL/PLATELET
BASOS ABS: 0 10*3/uL (ref 0.0–0.1)
Basophils Relative: 0 % (ref 0–1)
EOS PCT: 0 % (ref 0–5)
Eosinophils Absolute: 0 10*3/uL (ref 0.0–0.7)
HEMATOCRIT: 42.7 % (ref 36.0–46.0)
HEMOGLOBIN: 14.2 g/dL (ref 12.0–15.0)
Lymphocytes Relative: 7 % — ABNORMAL LOW (ref 12–46)
Lymphs Abs: 0.8 10*3/uL (ref 0.7–4.0)
MCH: 28 pg (ref 26.0–34.0)
MCHC: 33.3 g/dL (ref 30.0–36.0)
MCV: 84.1 fL (ref 78.0–100.0)
MONOS PCT: 1 % — AB (ref 3–12)
Monocytes Absolute: 0.1 10*3/uL (ref 0.1–1.0)
NEUTROS PCT: 92 % — AB (ref 43–77)
Neutro Abs: 9.8 10*3/uL — ABNORMAL HIGH (ref 1.7–7.7)
Platelets: 262 10*3/uL (ref 150–400)
RBC: 5.08 MIL/uL (ref 3.87–5.11)
RDW: 14.3 % (ref 11.5–15.5)
WBC: 10.7 10*3/uL — AB (ref 4.0–10.5)

## 2014-03-31 LAB — URINE MICROSCOPIC-ADD ON

## 2014-03-31 LAB — URINALYSIS, ROUTINE W REFLEX MICROSCOPIC
BILIRUBIN URINE: NEGATIVE
GLUCOSE, UA: 100 mg/dL — AB
Ketones, ur: NEGATIVE mg/dL
Nitrite: NEGATIVE
PH: 7.5 (ref 5.0–8.0)
Protein, ur: NEGATIVE mg/dL
Specific Gravity, Urine: 1.014 (ref 1.005–1.030)
UROBILINOGEN UA: 0.2 mg/dL (ref 0.0–1.0)

## 2014-03-31 LAB — COMPREHENSIVE METABOLIC PANEL
ALT: 31 U/L (ref 0–35)
ANION GAP: 17 — AB (ref 5–15)
AST: 34 U/L (ref 0–37)
Albumin: 4.9 g/dL (ref 3.5–5.2)
Alkaline Phosphatase: 143 U/L — ABNORMAL HIGH (ref 39–117)
BILIRUBIN TOTAL: 0.7 mg/dL (ref 0.3–1.2)
BUN: 12 mg/dL (ref 6–23)
CALCIUM: 10 mg/dL (ref 8.4–10.5)
CHLORIDE: 98 mmol/L (ref 96–112)
CO2: 24 mmol/L (ref 19–32)
CREATININE: 0.58 mg/dL (ref 0.50–1.10)
GFR calc Af Amer: >90 mL/min (ref >90)
GFR calc non Af Amer: >90 mL/min (ref >90)
Glucose, Bld: 175 mg/dL — ABNORMAL HIGH (ref 70–99)
Potassium: 3.2 mmol/L — ABNORMAL LOW (ref 3.5–5.1)
SODIUM: 139 mmol/L (ref 135–145)
Total Protein: 9 g/dL — ABNORMAL HIGH (ref 6.0–8.3)

## 2014-03-31 LAB — LIPASE, BLOOD: LIPASE: 20 U/L (ref 11–59)

## 2014-03-31 LAB — TROPONIN I

## 2014-03-31 MED ORDER — HYDROMORPHONE HCL 1 MG/ML IJ SOLN
1.0000 mg | Freq: Once | INTRAMUSCULAR | Status: AC
  Administered 2014-03-31: 1 mg via INTRAVENOUS
  Filled 2014-03-31: qty 1

## 2014-03-31 MED ORDER — OXYCODONE-ACETAMINOPHEN 5-325 MG PO TABS
1.0000 | ORAL_TABLET | Freq: Once | ORAL | Status: AC
  Administered 2014-03-31: 1 via ORAL
  Filled 2014-03-31: qty 1

## 2014-03-31 MED ORDER — OXYCODONE-ACETAMINOPHEN 5-325 MG PO TABS: 1.0000 | ORAL_TABLET | ORAL | Status: DC | PRN

## 2014-03-31 MED ORDER — SODIUM CHLORIDE 0.9 % IV SOLN
1000.0000 mL | Freq: Once | INTRAVENOUS | Status: AC
  Administered 2014-03-31: 1000 mL via INTRAVENOUS

## 2014-03-31 MED ORDER — SODIUM CHLORIDE 0.9 % IV SOLN: 1000.0000 mL | INTRAVENOUS | Status: DC

## 2014-03-31 MED ORDER — ONDANSETRON HCL 4 MG/2ML IJ SOLN
4.0000 mg | Freq: Once | INTRAMUSCULAR | Status: AC
  Administered 2014-03-31: 4 mg via INTRAVENOUS
  Filled 2014-03-31: qty 2

## 2014-03-31 NOTE — ED Notes (Signed)
EKG done by [facility] NT and shown to Dr Roxanne Mins.

## 2014-03-31 NOTE — ED Notes (Signed)
Ultrasound at bedside

## 2014-03-31 NOTE — ED Notes (Signed)
Bed: WA07 Expected date:  Expected time:  Means of arrival:  Comments: 

## 2014-03-31 NOTE — ED Provider Notes (Signed)
CSN: [id number]     Arrival date & time 03/31/14  0505 History   First MD Initiated Contact with Patient 03/31/14 [phone]     Chief Complaint  Patient presents with  . Abdominal Pain     (Consider location/radiation/quality/duration/timing/severity/associated sxs/prior Treatment) Patient is a 59 y.o. female presenting with abdominal pain. The history is provided by the patient.  Abdominal Pain She had onset about midnight of severe right upper quadrant pain with radiation to the back. There is associated nausea and vomiting. Pain is slightly improved after vomiting but then gets worse soon thereafter. She denies fever, chills, sweats. She admits to having eaten a big Mac, some chicken nuggets, and an ice cream sandwich before her pain had come on.  Past Medical History  Diagnosis Date  . Hyperlipidemia   . Hypertension   . Hypothyroidism   . Anemia     iron deficiency anemia  . Osteoarthritis   . Sleep apnea    Past Surgical History  Procedure Laterality Date  . Myomectomy     No family history on file. History  Substance Use Topics  . Smoking status: Former Research scientist (life sciences)  . Smokeless tobacco: Not on file     Comment: quit x 45yrs.  . Alcohol Use: No   OB History    No data available     Review of Systems  Unable to perform ROS Gastrointestinal: Positive for abdominal pain.  All other systems reviewed and are negative.     Allergies  Ace inhibitors and Hctz  Home Medications   Prior to Admission medications   Medication Sig Start Date End Date Taking? Authorizing Provider  allopurinol (ZYLOPRIM) 100 MG tablet Take 1 tablet (100 mg total) by mouth daily. 06/02/13   Malena Catholic, MD  amLODipine (NORVASC) 10 MG tablet Take 1 tablet (10 mg total) by mouth daily. 05/02/13   Pollie Friar, MD  docusate sodium (COLACE) 100 MG capsule Take 100 mg by mouth daily as needed for mild constipation.    Historical Provider, MD  ibuprofen (ADVIL,MOTRIN) 600 MG tablet Take 1  tablet (600 mg total) by mouth every 8 (eight) hours as needed for moderate pain. 12/22/13   Tasrif Ahmed, MD  levothyroxine (SYNTHROID, LEVOTHROID) 112 MCG tablet Take 1 tablet (112 mcg total) by mouth daily. 06/02/13 06/02/14  Malena Catholic, MD  loratadine (CLARITIN) 10 MG tablet Take 10 mg by mouth daily as needed for allergies.    Historical Provider, MD  rosuvastatin (CRESTOR) 20 MG tablet Take 1 tablet (20 mg total) by mouth at bedtime. 08/02/13 08/02/14  Malena Catholic, MD  spironolactone (ALDACTONE) 25 MG tablet Take 1 tablet (25 mg total) by mouth daily. 03/02/14   Tasrif Ahmed, MD   BP 147/75 mmHg  Pulse 82  Temp(Src) 98.2 F (36.8 C) (Oral)  Resp 18  SpO2 99% Physical Exam  Nursing note and vitals reviewed.  59 year old female, resting comfortably and in no acute distress. Vital signs are significant for hypertension. Oxygen saturation is 99%, which is normal. Head is normocephalic and atraumatic. PERRLA, EOMI. Oropharynx is clear. Neck is nontender and supple without adenopathy or JVD. Back is nontender and there is no CVA tenderness. Lungs are clear without rales, wheezes, or rhonchi. Chest is nontender. Heart has regular rate and rhythm without murmur. Abdomen is soft, flat, with epigastric and right upper quadrant tenderness and a positive Murphy sign. There is no rebound or guarding. masses or hepatosplenomegaly and peristalsis is hypoactive. Extremities  have no cyanosis or edema, full range of motion is present. Skin is warm and dry without rash. Neurologic: Mental status is normal, cranial nerves are intact, there are no motor or sensory deficits.  ED Course  Procedures (including critical care time) Labs Review Results for orders placed or performed during the hospital encounter of 03/31/14  Comprehensive metabolic panel  Result Value Ref Range   Sodium 139 135 - 145 mmol/L   Potassium 3.2 (L) 3.5 - 5.1 mmol/L   Chloride 98 96 - 112 mmol/L   CO2 24  19 - 32 mmol/L   Glucose, Bld 175 (H) 70 - 99 mg/dL   BUN 12 6 - 23 mg/dL   Creatinine, Ser 0.58 0.50 - 1.10 mg/dL   Calcium 10.0 8.4 - 10.5 mg/dL   Total Protein 9.0 (H) 6.0 - 8.3 g/dL   Albumin 4.9 3.5 - 5.2 g/dL   AST 34 0 - 37 U/L   ALT 31 0 - 35 U/L   Alkaline Phosphatase 143 (H) 39 - 117 U/L   Total Bilirubin 0.7 0.3 - 1.2 mg/dL   GFR calc non Af Amer >90 >90 mL/min   GFR calc Af Amer >90 >90 mL/min   Anion gap 17 (H) 5 - 15  Lipase, blood  Result Value Ref Range   Lipase 20 11 - 59 U/L  CBC with Differential  Result Value Ref Range   WBC 10.7 (H) 4.0 - 10.5 K/uL   RBC 5.08 3.87 - 5.11 MIL/uL   Hemoglobin 14.2 12.0 - 15.0 g/dL   HCT 42.7 36.0 - 46.0 %   MCV 84.1 78.0 - 100.0 fL   MCH 28.0 26.0 - 34.0 pg   MCHC 33.3 30.0 - 36.0 g/dL   RDW 14.3 11.5 - 15.5 %   Platelets 262 150 - 400 K/uL   Neutrophils Relative % 92 (H) 43 - 77 %   Neutro Abs 9.8 (H) 1.7 - 7.7 K/uL   Lymphocytes Relative 7 (L) 12 - 46 %   Lymphs Abs 0.8 0.7 - 4.0 K/uL   Monocytes Relative 1 (L) 3 - 12 %   Monocytes Absolute 0.1 0.1 - 1.0 K/uL   Eosinophils Relative 0 0 - 5 %   Eosinophils Absolute 0.0 0.0 - 0.7 K/uL   Basophils Relative 0 0 - 1 %   Basophils Absolute 0.0 0.0 - 0.1 K/uL  Troponin I  Result Value Ref Range   Troponin I <0.03 <0.031 ng/mL  Urinalysis, Routine w reflex microscopic  Result Value Ref Range   Color, Urine YELLOW YELLOW   APPearance CLEAR CLEAR   Specific Gravity, Urine 1.014 1.005 - 1.030   pH 7.5 5.0 - 8.0   Glucose, UA 100 (A) NEGATIVE mg/dL   Hgb urine dipstick TRACE (A) NEGATIVE   Bilirubin Urine NEGATIVE NEGATIVE   Ketones, ur NEGATIVE NEGATIVE mg/dL   Protein, ur NEGATIVE NEGATIVE mg/dL   Urobilinogen, UA 0.2 0.0 - 1.0 mg/dL   Nitrite NEGATIVE NEGATIVE   Leukocytes, UA MODERATE (A) NEGATIVE  Urine microscopic-add on  Result Value Ref Range   WBC, UA 7-10 <3 WBC/hpf    MDM   Final diagnoses:  RUQ pain  Gallbladder disease    Right upper quadrant  pain worrisome for biliary colic. She'll be given IV fluids, hydromorphone for pain, ondansetron for nausea and will be sent for abdominal ultrasound. Review of past records shows no similar ED visits and no prior abdominal imaging studies-either CT or ultrasound. Case is signed  out to Dr. Eulis Foster.    Delora Fuel, MD 50/[id number]

## 2014-03-31 NOTE — Discharge Instructions (Signed)
Your abdominal pain, is likely from the gallstones, found on ultrasound testing today. You'll need to see a surgeon for evaluation of possible cholecystectomy. In the meantime, stay on a low-fat diet, and drink plenty of fluids. Return here if needed, for problems.   Cholelithiasis Cholelithiasis (also called gallstones) is a form of gallbladder disease in which gallstones form in your gallbladder. The gallbladder is an organ that stores bile made in the liver, which helps digest fats. Gallstones begin as small crystals and slowly grow into stones. Gallstone pain occurs when the gallbladder spasms and a gallstone is blocking the duct. Pain can also occur when a stone passes out of the duct.  RISK FACTORS  Being female.   Having multiple pregnancies. Health care providers sometimes advise removing diseased gallbladders before future pregnancies.   Being obese.  Eating a diet heavy in fried foods and fat.   Being older than 4 years and increasing age.   Prolonged use of medicines containing female hormones.   Having diabetes mellitus.   Rapidly losing weight.   Having a family history of gallstones (heredity).  SYMPTOMS  Nausea.   Vomiting.  Abdominal pain.   Yellowing of the skin (jaundice).   Sudden pain. It may persist from several minutes to several hours.  Fever.   Tenderness to the touch. In some cases, when gallstones do not move into the bile duct, people have no pain or symptoms. These are called "silent" gallstones.  TREATMENT Silent gallstones do not need treatment. In severe cases, emergency surgery may be required. Options for treatment include:  Surgery to remove the gallbladder. This is the most common treatment.  Medicines. These do not always work and may take 6-12 months or more to work.  Shock wave treatment (extracorporeal biliary lithotripsy). In this treatment an ultrasound machine sends shock waves to the gallbladder to break  gallstones into smaller pieces that can pass into the intestines or be dissolved by medicine. HOME CARE INSTRUCTIONS   Only take over-the-counter or prescription medicines for pain, discomfort, or fever as directed by your health care provider.   Follow a low-fat diet until seen again by your health care provider. Fat causes the gallbladder to contract, which can result in pain.   Follow up with your health care provider as directed. Attacks are almost always recurrent and surgery is usually required for permanent treatment.  SEEK IMMEDIATE MEDICAL CARE IF:   Your pain increases and is not controlled by medicines.   You have a fever or persistent symptoms for more than 2-3 days.   You have a fever and your symptoms suddenly get worse.   You have persistent nausea and vomiting.  MAKE SURE YOU:   Understand these instructions.  Will watch your condition.  Will get help right away if you are not doing well or get worse. Document Released: 12/25/2004 Document Revised: 08/31/2012 Document Reviewed: 06/22/2012 [facility] Patient Information 2015 [place], [place]. This information is not intended to replace advice given to you by your health care provider. Make sure you discuss any questions you have with your health care provider.

## 2014-03-31 NOTE — ED Notes (Signed)
Attempted PIV and blood draw x 2 with no success, 2nd RN assessed. IV team consult ordered.

## 2014-03-31 NOTE — ED Provider Notes (Signed)
09:45- she states her pain is 7/10 at this time. Ultrasound has returned showing gallstones without complicating features. Patient reports this is the second time she has had right upper quadrant pain. She does not have any nausea, vomiting, fever, chills, cough, shortness of breath or chest pain.  She would like to try oral medications at this time, and discharge with outpatient follow-up, with general surgery.    Daleen Bo, MD 03/31/14 [phone]

## 2014-03-31 NOTE — ED Notes (Signed)
Per EMS pt from home with c/o upper abdominal pain left and right, with n/v since 1:00 this morning. Denies diarrhea.

## 2014-04-21 ENCOUNTER — Other Ambulatory Visit: Payer: Self-pay | Admitting: Internal Medicine

## 2014-05-16 ENCOUNTER — Other Ambulatory Visit: Payer: Self-pay | Admitting: *Deleted

## 2014-05-17 ENCOUNTER — Telehealth: Payer: Self-pay | Admitting: Pulmonary Disease

## 2014-05-17 MED ORDER — AMLODIPINE BESYLATE 10 MG PO TABS: 10.0000 mg | ORAL_TABLET | Freq: Every day | ORAL | Status: DC

## 2014-05-17 NOTE — Telephone Encounter (Signed)
Please ask patient to see Lydia Walsh before further refill.

## 2014-05-17 NOTE — Telephone Encounter (Signed)
Call to patient to confirm appointment for 05/18/14 at 10:15 lmtcb

## 2014-05-18 ENCOUNTER — Encounter: Payer: Self-pay | Admitting: Pulmonary Disease

## 2014-05-18 ENCOUNTER — Ambulatory Visit (INDEPENDENT_AMBULATORY_CARE_PROVIDER_SITE_OTHER): Payer: BLUE CROSS/BLUE SHIELD | Admitting: Pulmonary Disease

## 2014-05-18 VITALS — BP 144/72 | HR 83 | Temp 97.8°F | Ht 60.0 in | Wt 175.1 lb

## 2014-05-18 DIAGNOSIS — Z0000 Encounter for general adult medical examination without abnormal findings: Secondary | ICD-10-CM

## 2014-05-18 DIAGNOSIS — E785 Hyperlipidemia, unspecified: Secondary | ICD-10-CM

## 2014-05-18 DIAGNOSIS — I10 Essential (primary) hypertension: Secondary | ICD-10-CM | POA: Diagnosis not present

## 2014-05-18 LAB — BASIC METABOLIC PANEL WITH GFR
BUN: 16 mg/dL (ref 6–23)
CALCIUM: 9.2 mg/dL (ref 8.4–10.5)
CO2: 29 mEq/L (ref 19–32)
Chloride: 100 mEq/L (ref 96–112)
Creat: 0.58 mg/dL (ref 0.50–1.10)
Glucose, Bld: 100 mg/dL — ABNORMAL HIGH (ref 70–99)
Potassium: 3.9 mEq/L (ref 3.5–5.3)
SODIUM: 138 meq/L (ref 135–145)

## 2014-05-18 MED ORDER — SIMVASTATIN 20 MG PO TABS: 20.0000 mg | ORAL_TABLET | Freq: Every evening | ORAL | Status: DC

## 2014-05-18 NOTE — Telephone Encounter (Signed)
Message to front desk for appointment

## 2014-05-18 NOTE — Patient Instructions (Signed)
General Instructions:   Please bring your medicines with you each time you come to clinic.  Medicines may include prescription medications, over-the-counter medications, herbal remedies, eye drops, vitamins, or other pills.   Progress Toward Treatment Goals:  Treatment Goal 05/18/2014  Blood pressure improved    Self Care Goals & Plans:  Self Care Goal 12/01/2013  Manage my medications take my medicines as prescribed; bring my medications to every visit; refill my medications on time; follow the sick day instructions if I am sick  Monitor my health keep track of my blood pressure  Eat healthy foods eat more vegetables; eat fruit for snacks and desserts; eat baked foods instead of fried foods; eat foods that are low in salt; eat smaller portions  Be physically active find an activity I enjoy    No flowsheet data found.

## 2014-05-21 NOTE — Progress Notes (Signed)
INTERNAL MEDICINE TEACHING ATTENDING ADDENDUM - Mylan Schwarz, MD: I reviewed and discussed at the time of visit with the resident Dr. Krall, the patient's medical history, physical examination, diagnosis and results of pertinent tests and treatment and I agree with the patient's care as documented.  

## 2014-05-21 NOTE — Assessment & Plan Note (Signed)
Pt reports nightmares on Crestor.  Lipid Panel     Component Value Date/Time   CHOL 180 11/08/2013 1037   TRIG 91 11/08/2013 1037   HDL 61 11/08/2013 1037   CHOLHDL 3.0 11/08/2013 1037   VLDL 18 11/08/2013 1037   LDLCALC 101* 11/08/2013 1037   Plan:  -Switch to simvastatin 20mg  daily.  -Recheck lipid panel in 4-12 weeks

## 2014-05-21 NOTE — Progress Notes (Signed)
Subjective:   Patient ID: MALAYIA SPIZZIRRI, female    DOB: [birth year], 59 y.o.   MRN: [id number]  HPI Ms. AVALEIGH DECUIR is a 59 year old woman with history of HLD, HTN, hypothyroidism, anemia presenting for follow up.  She reports she has not been tolerating Crestor. It has been giving her nightmares.  She also reports cramping in the back of her legs and feet for the last 3 weeks.  Review of Systems Constitutional: no fevers/chills Eyes: no vision changes Ears, nose, mouth, throat, and face: no cough Respiratory: no shortness of breath Cardiovascular: no chest pain Gastrointestinal: no nausea/vomiting, no abdominal pain, no constipation, no diarrhea Genitourinary: no dysuria, no hematuria Integument: no rash Hematologic/lymphatic: no bleeding/bruising, no edema Musculoskeletal: no arthralgias Neurological: no paresthesias, no weakness  Past Medical History  Diagnosis Date  . Hyperlipidemia   . Hypertension   . Hypothyroidism   . Anemia     iron deficiency anemia  . Osteoarthritis   . Sleep apnea     Current Outpatient Prescriptions on File Prior to Visit  Medication Sig Dispense Refill  . allopurinol (ZYLOPRIM) 100 MG tablet Take 1 tablet (100 mg total) by mouth daily. 30 tablet 11  . amLODipine (NORVASC) 10 MG tablet Take 1 tablet (10 mg total) by mouth daily. 30 tablet 0  . levothyroxine (SYNTHROID, LEVOTHROID) 112 MCG tablet Take 1 tablet (112 mcg total) by mouth daily. 90 tablet 3  . spironolactone (ALDACTONE) 25 MG tablet TAKE ONE TABLET BY MOUTH ONCE DAILY 30 tablet 0  . docusate sodium (COLACE) 100 MG capsule Take 100 mg by mouth daily as needed for mild constipation.    Marland Kitchen ibuprofen (ADVIL,MOTRIN) 200 MG tablet Take 400 mg by mouth every 6 (six) hours as needed for moderate pain.    Marland Kitchen ibuprofen (ADVIL,MOTRIN) 600 MG tablet Take 1 tablet (600 mg total) by mouth every 8 (eight) hours as needed for moderate pain. (Patient not taking: Reported on 03/31/2014) 30 tablet 1    . loratadine (CLARITIN) 10 MG tablet Take 10 mg by mouth daily as needed for allergies.    Marland Kitchen oxyCODONE-acetaminophen (PERCOCET) 5-325 MG per tablet Take 1 tablet by mouth every 4 (four) hours as needed for severe pain. 20 tablet 0  . [DISCONTINUED] esomeprazole (NEXIUM) 20 MG capsule Take 2 capsules (40 mg total) by mouth daily before breakfast. 30 capsule 3   No current facility-administered medications on file prior to visit.    Today's Vitals   05/18/14 1051  BP: 144/72  Pulse: 83  Temp: 97.8 F (36.6 C)  TempSrc: Oral  Height: 5' (1.524 m)  Weight: 175 lb 1.6 oz (79.425 kg)  SpO2: 100%  PainSc: 0-No pain    Objective:  Physical Exam  Constitutional: She is oriented to person, place, and time. She appears well-developed. No distress.  HENT:  Head: Normocephalic and atraumatic.  Mouth/Throat: Oropharynx is clear and moist. No oropharyngeal exudate.  Eyes: EOM are normal. Pupils are equal, round, and reactive to light.  Neck: Neck supple.  Cardiovascular: Normal rate and regular rhythm.   Pulmonary/Chest: Effort normal and breath sounds normal. No respiratory distress. She has no wheezes. She has no rales.  Abdominal: Soft. Bowel sounds are normal. She exhibits no distension.  Musculoskeletal: Normal range of motion. She exhibits no edema or tenderness.  Neurological: She is alert and oriented to person, place, and time. No cranial nerve deficit.  Skin: Skin is warm. She is not diaphoretic.  Psychiatric: She has a normal  mood and affect.   Assessment & Plan:  Please refer to problem based charting.

## 2014-05-21 NOTE — Assessment & Plan Note (Signed)
BP Readings from Last 3 Encounters:  05/18/14 144/72  03/31/14 157/89  12/01/13 130/68    Lab Results  Component Value Date   NA 138 05/18/2014   K 3.9 05/18/2014   CREATININE 0.58 05/18/2014    Assessment: Blood pressure control: controlled Progress toward BP goal:  improved Comments: Potassium 3.9 on 05/18/2014  Plan: Medications:  continue current medications amlodipine 10mg , sprironolactone 25mg 

## 2014-05-21 NOTE — Addendum Note (Signed)
Addended by: Orson Gear on: 05/21/2014 11:26 AM   Modules accepted: Orders

## 2014-05-21 NOTE — Assessment & Plan Note (Signed)
Mammogram ordered

## 2014-06-06 ENCOUNTER — Other Ambulatory Visit: Payer: Self-pay | Admitting: Internal Medicine

## 2014-06-24 ENCOUNTER — Other Ambulatory Visit: Payer: Self-pay | Admitting: Internal Medicine

## 2014-07-03 ENCOUNTER — Ambulatory Visit: Payer: BLUE CROSS/BLUE SHIELD

## 2014-07-10 ENCOUNTER — Ambulatory Visit
Admission: RE | Admit: 2014-07-10 | Discharge: 2014-07-10 | Disposition: A | Discharge status: Home / Self Care | Payer: BLUE CROSS/BLUE SHIELD | Source: Ambulatory Visit | Attending: Internal Medicine | Admitting: Internal Medicine

## 2014-07-10 DIAGNOSIS — Z0000 Encounter for general adult medical examination without abnormal findings: Secondary | ICD-10-CM

## 2014-07-12 ENCOUNTER — Other Ambulatory Visit: Payer: Self-pay | Admitting: Internal Medicine

## 2014-07-26 ENCOUNTER — Ambulatory Visit (INDEPENDENT_AMBULATORY_CARE_PROVIDER_SITE_OTHER): Payer: BLUE CROSS/BLUE SHIELD | Admitting: Internal Medicine

## 2014-07-26 ENCOUNTER — Encounter: Payer: Self-pay | Admitting: Internal Medicine

## 2014-07-26 VITALS — BP 129/63 | HR 82 | Temp 98.1°F | Ht 60.0 in | Wt 175.8 lb

## 2014-07-26 DIAGNOSIS — N950 Postmenopausal bleeding: Secondary | ICD-10-CM

## 2014-07-26 DIAGNOSIS — R252 Cramp and spasm: Secondary | ICD-10-CM

## 2014-07-26 DIAGNOSIS — R5382 Chronic fatigue, unspecified: Secondary | ICD-10-CM

## 2014-07-26 DIAGNOSIS — R5383 Other fatigue: Secondary | ICD-10-CM

## 2014-07-26 LAB — CBC
HEMATOCRIT: 40.4 % (ref 36.0–46.0)
HEMOGLOBIN: 13.5 g/dL (ref 12.0–15.0)
MCH: 28 pg (ref 26.0–34.0)
MCHC: 33.4 g/dL (ref 30.0–36.0)
MCV: 83.8 fL (ref 78.0–100.0)
MPV: 10.7 fL (ref 8.6–12.4)
Platelets: 239 10*3/uL (ref 150–400)
RBC: 4.82 MIL/uL (ref 3.87–5.11)
RDW: 14.8 % (ref 11.5–15.5)
WBC: 5.9 10*3/uL (ref 4.0–10.5)

## 2014-07-26 LAB — COMPLETE METABOLIC PANEL WITH GFR
ALT: 16 U/L (ref 0–35)
AST: 16 U/L (ref 0–37)
Albumin: 4.3 g/dL (ref 3.5–5.2)
Alkaline Phosphatase: 96 U/L (ref 39–117)
BUN: 15 mg/dL (ref 6–23)
CO2: 26 meq/L (ref 19–32)
CREATININE: 0.62 mg/dL (ref 0.50–1.10)
Calcium: 9.6 mg/dL (ref 8.4–10.5)
Chloride: 101 mEq/L (ref 96–112)
Glucose, Bld: 97 mg/dL (ref 70–99)
Potassium: 4 mEq/L (ref 3.5–5.3)
Sodium: 141 mEq/L (ref 135–145)
Total Bilirubin: 0.6 mg/dL (ref 0.2–1.2)
Total Protein: 7.3 g/dL (ref 6.0–8.3)

## 2014-07-26 LAB — ANEMIA PANEL
%SAT: 21 % (ref 20–55)
ABS Retic: 38.6 10*3/uL (ref 19.0–186.0)
Ferritin: 29 ng/mL (ref 10–291)
Folate: 12.1 ng/mL
Iron: 74 ug/dL (ref 42–145)
RBC.: 4.82 MIL/uL (ref 3.87–5.11)
RETIC CT PCT: 0.8 % (ref 0.4–2.3)
TIBC: 356 ug/dL (ref 250–470)
UIBC: 282 ug/dL (ref 125–400)
Vitamin B-12: 397 pg/mL (ref 211–911)

## 2014-07-26 LAB — TSH: TSH: 1.692 u[IU]/mL (ref 0.350–4.500)

## 2014-07-26 NOTE — Progress Notes (Signed)
Subjective:    Patient ID: Lydia Walsh, female    DOB: Jul 13, 1955, 59 y.o.   MRN: [id number]  HPI Comments: Lydia Walsh is a 59 year old with PMH as below here for fatigue x 1 week.  Please see problem based charting for assessment and plan.    Past Medical History  Diagnosis Date  . Hyperlipidemia   . Hypertension   . Hypothyroidism   . Anemia     iron deficiency anemia  . Osteoarthritis   . Sleep apnea    Current Outpatient Prescriptions on File Prior to Visit  Medication Sig Dispense Refill  . allopurinol (ZYLOPRIM) 100 MG tablet TAKE ONE TABLET BY MOUTH ONCE DAILY 30 tablet 0  . amLODipine (NORVASC) 10 MG tablet TAKE ONE TABLET BY MOUTH ONCE DAILY 30 tablet 0  . docusate sodium (COLACE) 100 MG capsule Take 100 mg by mouth daily as needed for mild constipation.    Marland Kitchen ibuprofen (ADVIL,MOTRIN) 200 MG tablet Take 400 mg by mouth every 6 (six) hours as needed for moderate pain.    Marland Kitchen ibuprofen (ADVIL,MOTRIN) 600 MG tablet Take 1 tablet (600 mg total) by mouth every 8 (eight) hours as needed for moderate pain. (Patient not taking: Reported on 03/31/2014) 30 tablet 1  . levothyroxine (SYNTHROID, LEVOTHROID) 112 MCG tablet Take 1 tablet (112 mcg total) by mouth daily. 90 tablet 3  . loratadine (CLARITIN) 10 MG tablet Take 10 mg by mouth daily as needed for allergies.    Marland Kitchen oxyCODONE-acetaminophen (PERCOCET) 5-325 MG per tablet Take 1 tablet by mouth every 4 (four) hours as needed for severe pain. 20 tablet 0  . simvastatin (ZOCOR) 20 MG tablet Take 1 tablet (20 mg total) by mouth every evening. 30 tablet 1  . spironolactone (ALDACTONE) 25 MG tablet TAKE ONE TABLET BY MOUTH ONCE DAILY. 30 tablet 0  . [DISCONTINUED] esomeprazole (NEXIUM) 20 MG capsule Take 2 capsules (40 mg total) by mouth daily before breakfast. 30 capsule 3   No current facility-administered medications on file prior to visit.    Review of Systems  Constitutional: Positive for fatigue. Negative for fever, chills and  appetite change.  Respiratory: Negative for shortness of breath.   Cardiovascular: Positive for leg swelling. Negative for chest pain and palpitations.       Occasional leg swelling.  Gastrointestinal: Negative for nausea, vomiting, abdominal pain, diarrhea, constipation and blood in stool.  Endocrine: Negative for cold intolerance and heat intolerance.  Genitourinary: Positive for vaginal bleeding. Negative for vaginal discharge, menstrual problem and pelvic pain.       Spotting x 2 weeks.  Post-menopausal (LMP 2014), hx of fibroids.   Neurological: Negative for syncope and light-headedness.  Hematological: Does not bruise/bleed easily.  Psychiatric/Behavioral: Negative for sleep disturbance and dysphoric mood.       Filed Vitals:   07/26/14 0924  BP: 129/63  Pulse: 82  Temp: 98.1 F (36.7 C)  TempSrc: Oral  Height: 5' (1.524 m)  Weight: 175 lb 12.8 oz (79.742 kg)  SpO2: 100%    Objective:   Physical Exam  Constitutional: She is oriented to person, place, and time. She appears well-developed. No distress.  HENT:  Head: Normocephalic and atraumatic.  Mouth/Throat: Oropharynx is clear and moist. No oropharyngeal exudate.  Eyes: EOM are normal. Pupils are equal, round, and reactive to light.  Neck: Neck supple. No thyromegaly present.  Cardiovascular: Normal rate, regular rhythm and normal heart sounds.  Exam reveals no gallop and no friction rub.  No murmur heard. Pulmonary/Chest: Effort normal and breath sounds normal. No respiratory distress. She has no wheezes. She has no rales.  Abdominal: Soft. Bowel sounds are normal. She exhibits no distension and no mass. There is tenderness. There is no rebound and no guarding.  LLQ TTP  Musculoskeletal: Normal range of motion. She exhibits no edema or tenderness.  Neurological: She is alert and oriented to person, place, and time. She has normal reflexes. No cranial nerve deficit.  Skin: Skin is warm. She is not diaphoretic.    Psychiatric: She has a normal mood and affect. Her behavior is normal. Judgment and thought content normal.          Assessment & Plan:  Please see problem based charting for assessment and plan.

## 2014-07-26 NOTE — Assessment & Plan Note (Addendum)
Spotting x 2 weeks and feels fatigued.  She denies bleeding today.  LLQ TTP on exam.  She has hx of fibroids managed by Gyn, Dr. Radene Knee in the past.  No recent PAP in EPIC.  I explained to her that likely cause is benign such as vaginal/uterine atrophy.  Could also be her known fibroids.  Will need to r/o gyn malignancy with endometrial biopsy. - referral placed to Dr. Sherran Needs office for evaluation, imaging and endometrial biopsy - had initially ordered transvag/pelvic imaging but I will defer to his office since they have this available and it will ensure he gets the results since his office is not linked to [facility]

## 2014-07-26 NOTE — Assessment & Plan Note (Signed)
B/L leg cramps (calves) with exertion, better with rest and drinking water. Not worse at night.  She is non-smoker with 2+ DP so I doubt PAD.  She has been hypokalemic in the past so I will check K.  She will follow-up in 1 month.

## 2014-07-26 NOTE — Assessment & Plan Note (Addendum)
Has 1 week of fatigue.  Sleeping well, denies PND.  No change in work schedule.  Associated with Fe-deficiency anemia in the past and she says she felt better after Fe supplement but that it was stopped when she improved.  Compliant with levothyroxine (she has the bottle with her, filled in 05/2014 for 90 day supply).  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 - CBC, anemia panel, CMP, TSH - return in 1 month for follow-up

## 2014-07-26 NOTE — Patient Instructions (Addendum)
1. I am sending you for transvaginal and pelvic ultrasound and I am referring you back to Dr. Radene Knee for further evaluation of your vaginal bleeding.  I will call you if there are problems with your labs.   2. Please take all medications as prescribed.   3. If you have worsening of your symptoms or new symptoms arise, please call the clinic ([phone]), or go to the ER immediately if symptoms are severe.   Please come back to see me or your primary care doctor in 4 weeks to see if your fatigue and leg cramping have resolved.

## 2014-07-27 ENCOUNTER — Telehealth: Payer: Self-pay | Admitting: *Deleted

## 2014-07-27 ENCOUNTER — Other Ambulatory Visit: Payer: Self-pay | Admitting: Internal Medicine

## 2014-07-27 NOTE — Telephone Encounter (Signed)
Prescription for Levothyroxine 112 MCG 1 po daily #90 called to [facility] per order of Dr. Redmond Pulling.  Call to patient given results of labs -normal per order of Dr. Redmond Pulling.  Sander Nephew, RN  07/27/2014 3:44 PM

## 2014-07-30 NOTE — Progress Notes (Signed)
Internal Medicine Clinic Attending  Case discussed with Dr. Wilson soon after the resident saw the patient.  We reviewed the resident's history and exam and pertinent patient test results.  I agree with the assessment, diagnosis, and plan of care documented in the resident's note.  

## 2014-08-02 ENCOUNTER — Other Ambulatory Visit: Payer: Self-pay | Admitting: Pulmonary Disease

## 2014-08-02 ENCOUNTER — Other Ambulatory Visit: Payer: Self-pay | Admitting: Internal Medicine

## 2014-08-30 ENCOUNTER — Other Ambulatory Visit: Payer: Self-pay | Admitting: Internal Medicine

## 2014-09-10 ENCOUNTER — Other Ambulatory Visit: Payer: Self-pay | Admitting: Internal Medicine

## 2014-09-27 ENCOUNTER — Other Ambulatory Visit: Payer: Self-pay | Admitting: Internal Medicine

## 2014-10-18 ENCOUNTER — Other Ambulatory Visit: Payer: Self-pay | Admitting: Internal Medicine

## 2014-11-08 ENCOUNTER — Other Ambulatory Visit: Payer: Self-pay | Admitting: Internal Medicine

## 2014-11-23 ENCOUNTER — Ambulatory Visit (INDEPENDENT_AMBULATORY_CARE_PROVIDER_SITE_OTHER): Payer: Self-pay | Admitting: Internal Medicine

## 2014-11-23 ENCOUNTER — Encounter: Payer: Self-pay | Admitting: Internal Medicine

## 2014-11-23 VITALS — BP 139/59 | HR 88 | Temp 98.4°F | Ht 60.0 in | Wt 180.3 lb

## 2014-11-23 DIAGNOSIS — I10 Essential (primary) hypertension: Secondary | ICD-10-CM

## 2014-11-23 DIAGNOSIS — M791 Myalgia: Secondary | ICD-10-CM

## 2014-11-23 DIAGNOSIS — W1830XA Fall on same level, unspecified, initial encounter: Secondary | ICD-10-CM | POA: Insufficient documentation

## 2014-11-23 MED ORDER — CAPSAICIN-MENTHOL-METHYL SAL 0.025-1-12 % EX CREA: TOPICAL_CREAM | CUTANEOUS | Status: DC

## 2014-11-23 MED ORDER — LEVOTHYROXINE SODIUM 112 MCG PO TABS: 112.0000 ug | ORAL_TABLET | Freq: Every day | ORAL | Status: DC

## 2014-11-23 MED ORDER — ALLOPURINOL 100 MG PO TABS: 100.0000 mg | ORAL_TABLET | Freq: Every day | ORAL | Status: DC

## 2014-11-23 MED ORDER — IBUPROFEN 200 MG PO TABS: 400.0000 mg | ORAL_TABLET | Freq: Four times a day (QID) | ORAL | Status: AC

## 2014-11-23 MED ORDER — SIMVASTATIN 20 MG PO TABS: 20.0000 mg | ORAL_TABLET | Freq: Every evening | ORAL | Status: DC

## 2014-11-23 MED ORDER — AMLODIPINE BESYLATE 10 MG PO TABS: 10.0000 mg | ORAL_TABLET | Freq: Every day | ORAL | Status: DC

## 2014-11-23 NOTE — Progress Notes (Signed)
Patient ID: Lydia Walsh, female   DOB: [birth year], 59 y.o.   MRN: [id number]    Subjective:   Patient ID: Lydia Walsh female   DOB: 1955/08/04 59 y.o.   MRN: [id number]  HPI: Lydia Walsh is a 59 y.o. woman with PMH noted below here to refill her meds and to discuss her fall last Friday  Please see Problem List/A&P for the status of the patient's chronic medical problems      Past Medical History  Diagnosis Date  . Hyperlipidemia   . Hypertension   . Hypothyroidism   . Anemia     iron deficiency anemia  . Osteoarthritis   . Sleep apnea    Current Outpatient Prescriptions  Medication Sig Dispense Refill  . allopurinol (ZYLOPRIM) 100 MG tablet Take 1 tablet (100 mg total) by mouth daily. 30 tablet 2  . amLODipine (NORVASC) 10 MG tablet Take 1 tablet (10 mg total) by mouth daily. 30 tablet 2  . ibuprofen (ADVIL,MOTRIN) 200 MG tablet Take 2 tablets (400 mg total) by mouth every 6 (six) hours. 30 tablet 0  . levothyroxine (SYNTHROID, LEVOTHROID) 112 MCG tablet Take 1 tablet (112 mcg total) by mouth daily. 90 tablet 2  . simvastatin (ZOCOR) 20 MG tablet Take 1 tablet (20 mg total) by mouth every evening. 30 tablet 2  . spironolactone (ALDACTONE) 25 MG tablet TAKE ONE TABLET BY MOUTH ONCE DAILY 30 tablet 0  . Capsaicin-Menthol-Methyl Sal (CAPSAICIN-METHYL SAL-MENTHOL) 0.025-1-12 % CREA Apple 4 g to affected area as needed upto 3 times a day 1 Tube 3  . [DISCONTINUED] esomeprazole (NEXIUM) 20 MG capsule Take 2 capsules (40 mg total) by mouth daily before breakfast. 30 capsule 3   No current facility-administered medications for this visit.   No family history on file. Social History   Social History  . Marital Status: Divorced    Spouse Name: N/A  . Number of Children: N/A  . Years of Education: N/A   Social History Main Topics  . Smoking status: Former Research scientist (life sciences)  . Smokeless tobacco: None     Comment: quit x 74yrs.  . Alcohol Use: No  . Drug Use: No  . Sexual Activity:  Not Asked   Other Topics Concern  . None   Social History Narrative   Lives with her son.  Perimenopausal.   Review of Systems: Review of Systems  Constitutional: Negative.  Negative for fever, chills, weight loss and malaise/fatigue.  Eyes: Negative for blurred vision.  Respiratory: Negative for cough and shortness of breath.   Cardiovascular: Negative for chest pain, palpitations and leg swelling.  Gastrointestinal: Negative for nausea, vomiting, abdominal pain and diarrhea.  Musculoskeletal: Positive for joint pain and falls.  Neurological: Negative for weakness and headaches.    Objective:  Physical Exam: Filed Vitals:   11/23/14 1009  BP: 139/59  Pulse: 88  Temp: 98.4 F (36.9 C)  TempSrc: Oral  Height: 5' (1.524 m)  Weight: 180 lb 4.8 oz (81.784 kg)  SpO2: 99%   Physical Exam  Constitutional: She is oriented to person, place, and time. She appears well-developed and well-nourished.  HENT:  Head: Normocephalic and atraumatic.  Eyes: EOM are normal. Pupils are equal, round, and reactive to light.  Neck: Normal range of motion. Neck supple.  Cardiovascular: Normal rate, regular rhythm, normal heart sounds and intact distal pulses.   No murmur heard. Pulmonary/Chest: Effort normal and breath sounds normal. No respiratory distress.  Abdominal: Soft. Bowel sounds are normal. She  exhibits no distension.  Musculoskeletal: Normal range of motion.  Left area under breast- probably bottom of her rib tender to palpation  Neurological: She is alert and oriented to person, place, and time. Coordination normal.    Assessment & Plan:   Please see problem based charting for assessment and plan

## 2014-11-23 NOTE — Patient Instructions (Signed)
Thank you for your visit today Please take 400 mg ibuprofen every 6 hours for the next 5 days Please use the capsaicin cream as needed I have refilled your medicines- blood pressure and thyroid Please follow up with your PCP as scheduled

## 2014-11-23 NOTE — Assessment & Plan Note (Signed)
Pt says that she was walking outside her home, and her "knees gave out" and she fell forward. She denies head injury or LOC.  She says her area under left breast is sore as that is the area which received the impact from the fall. She was taking some ibuprofen at home and says it is helping her but she only takes it occasionally   Exam showed distinct area of tenderness in that region- low suspicion for rib fracture or rib injury as it seems musculoskeletal.  Plan Given scheduled ibuprofen 400 mg q6 hours for 5 days capcaisin cream PRN RTC if symptoms do not improve in 2 weeks

## 2014-11-23 NOTE — Assessment & Plan Note (Signed)
BP stable Continue current regimen, and refilled her medicines

## 2014-11-23 NOTE — Progress Notes (Signed)
Medicine attending: I personally interviewed and briefly examined this patient on the day of the patient visit and reviewed pertinent clinical ,laboratory,  data  with resident physician Dr. Burgess Estelle and we discussed a management plan.

## 2014-12-21 ENCOUNTER — Other Ambulatory Visit: Payer: Self-pay | Admitting: Internal Medicine

## 2015-02-26 NOTE — H&P (Signed)
  Patient name Lydia Walsh DICTATION# [id number] CSN# [id number]  Darlyn Chamber, MD 02/26/2015 3:53 PM

## 2015-02-27 NOTE — H&P (Signed)
NAMEMAIZE, LINEBERGER NO.:  [id number]  MEDICAL RECORD NO.:  [id number]  LOCATION:                                 FACILITY:  PHYSICIAN:  Darlyn Chamber, M.D.        DATE OF BIRTH:  DATE OF ADMISSION: DATE OF DISCHARGE:                             HISTORY & PHYSICAL   DATE OF SURGERY:  March 07, 2015, at [facility] area, [place].  HISTORY OF PRESENT ILLNESS:  The patient is a 61 year old postmenopausal patient who is referred in for postmenopausal bleeding.  She had a past history of a hysteroscopic resection of benign endometrial polyp and had a history of known fibroids.  On ultrasound, she did have multiple fibroids.  They were small in size and they had been.  Saline infusion ultrasound revealed an endometrial polyp.  She now proceeds for hysteroscopy with MyoSure resection.  ALLERGIES:  In terms of allergies, no known drug allergies.  MEDICATIONS:  She is on spironolactone, amlodipine, levothyroxine, allopurinol, and simvastatin.  PAST MEDICAL HISTORY:  History of hypertension, hyperlipidemia, as well as arthritis and hypothyroidism.  PAST SURGICAL HISTORY:  She had a previous hysteroscopy is noted.  She has had 2 vaginal deliveries.  SOCIAL HISTORY:  Reveals no tobacco or alcohol use.  FAMILY HISTORY:  Noncontributory.  REVIEW OF SYSTEMS:  Noncontributory.  PHYSICAL EXAMINATION:  VITAL SIGNS:  The patient is afebrile.  Stable vital signs. HEENT:  The patient is normocephalic.  Pupils equal, round, reactive to light and accommodation.  Extraocular movements were intact.  Sclerae and conjunctivae were clear.  Oropharynx was clear.  No thyromegaly noted. LUNGS:  Clear. CARDIOVASCULAR SYSTEM:  Regular rhythm and rate without murmurs or gallops. ABDOMEN:  Benign.  No mass, organomegaly, or tenderness. PELVIC:  Normal external genitalia.  Vaginal mucosa is clear.  Cervix unremarkable.  Uterus normal size, shape, and contour.   Adnexa free of mass or tenderness. EXTREMITIES:  Trace edema. NEUROLOGIC:  Grossly within normal limits.  IMPRESSION:  Postmenopausal bleeding with evidence of endometrial polyp.  PLAN:  The patient will undergo hysteroscopic evaluation with MyoSure resection of endometrial polyp.  Risks of surgery have been explained including the risk of infection.  The risk of hemorrhage that could require transfusion with the risk of AIDS, hepatitis.  Excessive bleeding could require hysterectomy.  There is a risk of perforation with injury to adjacent organs such as bowel that could require further exploratory surgery. Risk of deep venous thrombosis and pulmonary embolus.  The patient expressed understanding the indications and risks.     Darlyn Chamber, M.D.     JSM/MEDQ  D:  02/26/2015  T:  02/26/2015  Job:  [id number]

## 2015-03-01 ENCOUNTER — Encounter (HOSPITAL_BASED_OUTPATIENT_CLINIC_OR_DEPARTMENT_OTHER): Payer: Self-pay | Admitting: *Deleted

## 2015-03-01 NOTE — Progress Notes (Signed)
NPO AFTER MN.  ARRIVE AT 0600.  GETTING LAB WORK DONE Monday 03-04-2015.  CURRENT EKG IN CHART AND EPIC.  WILL TAKE SYNTHROID AM DOS W/ SIPS OF WATER.

## 2015-03-04 DIAGNOSIS — E039 Hypothyroidism, unspecified: Secondary | ICD-10-CM | POA: Diagnosis not present

## 2015-03-04 DIAGNOSIS — Z87891 Personal history of nicotine dependence: Secondary | ICD-10-CM | POA: Diagnosis not present

## 2015-03-04 DIAGNOSIS — C541 Malignant neoplasm of endometrium: Secondary | ICD-10-CM | POA: Diagnosis not present

## 2015-03-04 DIAGNOSIS — Z6836 Body mass index (BMI) 36.0-36.9, adult: Secondary | ICD-10-CM | POA: Diagnosis not present

## 2015-03-04 DIAGNOSIS — N840 Polyp of corpus uteri: Secondary | ICD-10-CM | POA: Diagnosis not present

## 2015-03-04 DIAGNOSIS — M1990 Unspecified osteoarthritis, unspecified site: Secondary | ICD-10-CM | POA: Diagnosis not present

## 2015-03-04 DIAGNOSIS — N950 Postmenopausal bleeding: Secondary | ICD-10-CM | POA: Diagnosis present

## 2015-03-04 DIAGNOSIS — K219 Gastro-esophageal reflux disease without esophagitis: Secondary | ICD-10-CM | POA: Diagnosis not present

## 2015-03-04 DIAGNOSIS — E669 Obesity, unspecified: Secondary | ICD-10-CM | POA: Diagnosis not present

## 2015-03-04 DIAGNOSIS — I10 Essential (primary) hypertension: Secondary | ICD-10-CM | POA: Diagnosis not present

## 2015-03-04 DIAGNOSIS — E785 Hyperlipidemia, unspecified: Secondary | ICD-10-CM | POA: Diagnosis not present

## 2015-03-04 DIAGNOSIS — G4730 Sleep apnea, unspecified: Secondary | ICD-10-CM | POA: Diagnosis not present

## 2015-03-04 LAB — BASIC METABOLIC PANEL
ANION GAP: 9 (ref 5–15)
BUN: 24 mg/dL — ABNORMAL HIGH (ref 6–20)
CALCIUM: 9.6 mg/dL (ref 8.9–10.3)
CHLORIDE: 106 mmol/L (ref 101–111)
CO2: 26 mmol/L (ref 22–32)
CREATININE: 0.64 mg/dL (ref 0.44–1.00)
GFR calc non Af Amer: >60 mL/min (ref >60)
Glucose, Bld: 116 mg/dL — ABNORMAL HIGH (ref 65–99)
Potassium: 3.6 mmol/L (ref 3.5–5.1)
SODIUM: 141 mmol/L (ref 135–145)

## 2015-03-04 LAB — CBC
HCT: 40.4 % (ref 36.0–46.0)
HEMOGLOBIN: 13 g/dL (ref 12.0–15.0)
MCH: 27.7 pg (ref 26.0–34.0)
MCHC: 32.2 g/dL (ref 30.0–36.0)
MCV: 86 fL (ref 78.0–100.0)
Platelets: 272 10*3/uL (ref 150–400)
RBC: 4.7 MIL/uL (ref 3.87–5.11)
RDW: 14.2 % (ref 11.5–15.5)
WBC: 6.8 10*3/uL (ref 4.0–10.5)

## 2015-03-07 ENCOUNTER — Encounter (HOSPITAL_BASED_OUTPATIENT_CLINIC_OR_DEPARTMENT_OTHER): Payer: Self-pay | Admitting: *Deleted

## 2015-03-07 ENCOUNTER — Encounter (HOSPITAL_BASED_OUTPATIENT_CLINIC_OR_DEPARTMENT_OTHER)
Admission: RE | Disposition: A | Discharge status: Home / Self Care | Payer: Self-pay | Source: Ambulatory Visit | Attending: Obstetrics and Gynecology

## 2015-03-07 ENCOUNTER — Ambulatory Visit (HOSPITAL_BASED_OUTPATIENT_CLINIC_OR_DEPARTMENT_OTHER)
Admission: RE | Admit: 2015-03-07 | Discharge: 2015-03-07 | Disposition: A | Discharge status: Home / Self Care | Payer: BLUE CROSS/BLUE SHIELD | Source: Ambulatory Visit | Attending: Obstetrics and Gynecology | Admitting: Obstetrics and Gynecology

## 2015-03-07 ENCOUNTER — Ambulatory Visit (HOSPITAL_BASED_OUTPATIENT_CLINIC_OR_DEPARTMENT_OTHER): Payer: BLUE CROSS/BLUE SHIELD | Admitting: Anesthesiology

## 2015-03-07 DIAGNOSIS — N950 Postmenopausal bleeding: Secondary | ICD-10-CM | POA: Insufficient documentation

## 2015-03-07 DIAGNOSIS — E669 Obesity, unspecified: Secondary | ICD-10-CM | POA: Insufficient documentation

## 2015-03-07 DIAGNOSIS — N840 Polyp of corpus uteri: Secondary | ICD-10-CM

## 2015-03-07 DIAGNOSIS — Z9889 Other specified postprocedural states: Secondary | ICD-10-CM

## 2015-03-07 DIAGNOSIS — K219 Gastro-esophageal reflux disease without esophagitis: Secondary | ICD-10-CM | POA: Insufficient documentation

## 2015-03-07 DIAGNOSIS — Z6836 Body mass index (BMI) 36.0-36.9, adult: Secondary | ICD-10-CM | POA: Insufficient documentation

## 2015-03-07 DIAGNOSIS — E785 Hyperlipidemia, unspecified: Secondary | ICD-10-CM | POA: Insufficient documentation

## 2015-03-07 DIAGNOSIS — I10 Essential (primary) hypertension: Secondary | ICD-10-CM | POA: Insufficient documentation

## 2015-03-07 DIAGNOSIS — E039 Hypothyroidism, unspecified: Secondary | ICD-10-CM | POA: Insufficient documentation

## 2015-03-07 DIAGNOSIS — M1990 Unspecified osteoarthritis, unspecified site: Secondary | ICD-10-CM | POA: Insufficient documentation

## 2015-03-07 DIAGNOSIS — Z87891 Personal history of nicotine dependence: Secondary | ICD-10-CM | POA: Insufficient documentation

## 2015-03-07 DIAGNOSIS — C541 Malignant neoplasm of endometrium: Secondary | ICD-10-CM | POA: Insufficient documentation

## 2015-03-07 DIAGNOSIS — G4730 Sleep apnea, unspecified: Secondary | ICD-10-CM | POA: Insufficient documentation

## 2015-03-07 HISTORY — DX: Obstructive sleep apnea (adult) (pediatric): G47.33

## 2015-03-07 HISTORY — DX: Presence of spectacles and contact lenses: Z97.3

## 2015-03-07 HISTORY — DX: Presence of dental prosthetic device (complete) (partial): Z97.2

## 2015-03-07 HISTORY — DX: Personal history of other benign neoplasm: Z86.018

## 2015-03-07 HISTORY — DX: Gastro-esophageal reflux disease without esophagitis: K21.9

## 2015-03-07 HISTORY — DX: Polyp of corpus uteri: N84.0

## 2015-03-07 HISTORY — PX: DILATATION & CURETTAGE/HYSTEROSCOPY WITH MYOSURE: SHX6511

## 2015-03-07 HISTORY — DX: Postmenopausal bleeding: N95.0

## 2015-03-07 SURGERY — DILATATION & CURETTAGE/HYSTEROSCOPY WITH MYOSURE
Anesthesia: Monitor Anesthesia Care | Site: Vagina

## 2015-03-07 MED ORDER — DEXAMETHASONE SODIUM PHOSPHATE 10 MG/ML IJ SOLN
INTRAMUSCULAR | Status: AC
  Filled 2015-03-07: qty 1

## 2015-03-07 MED ORDER — FENTANYL CITRATE (PF) 100 MCG/2ML IJ SOLN
25.0000 ug | INTRAMUSCULAR | Status: DC | PRN
  Filled 2015-03-07: qty 1

## 2015-03-07 MED ORDER — MIDAZOLAM HCL 2 MG/2ML IJ SOLN
INTRAMUSCULAR | Status: AC
  Filled 2015-03-07: qty 2

## 2015-03-07 MED ORDER — LIDOCAINE HCL 1 % IJ SOLN
INTRAMUSCULAR | Status: DC | PRN
  Administered 2015-03-07: 20 mL

## 2015-03-07 MED ORDER — KETOROLAC TROMETHAMINE 30 MG/ML IJ SOLN
INTRAMUSCULAR | Status: AC
  Filled 2015-03-07: qty 1

## 2015-03-07 MED ORDER — OXYCODONE HCL 5 MG/5ML PO SOLN
5.0000 mg | Freq: Once | ORAL | Status: AC | PRN
  Filled 2015-03-07: qty 5

## 2015-03-07 MED ORDER — ONDANSETRON HCL 4 MG/2ML IJ SOLN
INTRAMUSCULAR | Status: DC | PRN
  Administered 2015-03-07: 4 mg via INTRAVENOUS

## 2015-03-07 MED ORDER — PROPOFOL 500 MG/50ML IV EMUL
INTRAVENOUS | Status: DC | PRN
  Administered 2015-03-07: 200 ug/kg/min via INTRAVENOUS

## 2015-03-07 MED ORDER — PROPOFOL 500 MG/50ML IV EMUL
INTRAVENOUS | Status: AC
  Filled 2015-03-07: qty 50

## 2015-03-07 MED ORDER — CEFAZOLIN SODIUM-DEXTROSE 2-3 GM-% IV SOLR
INTRAVENOUS | Status: AC
  Filled 2015-03-07: qty 50

## 2015-03-07 MED ORDER — LACTATED RINGERS IV SOLN
INTRAVENOUS | Status: DC
  Administered 2015-03-07: 07:00:00 via INTRAVENOUS
  Filled 2015-03-07: qty 1000

## 2015-03-07 MED ORDER — FENTANYL CITRATE (PF) 100 MCG/2ML IJ SOLN
INTRAMUSCULAR | Status: AC
  Filled 2015-03-07: qty 2

## 2015-03-07 MED ORDER — LIDOCAINE HCL (CARDIAC) 20 MG/ML IV SOLN
INTRAVENOUS | Status: AC
  Filled 2015-03-07: qty 5

## 2015-03-07 MED ORDER — MIDAZOLAM HCL 5 MG/5ML IJ SOLN
INTRAMUSCULAR | Status: DC | PRN
Start: 2015-03-07 — End: 2015-03-07
  Administered 2015-03-07: 2 mg via INTRAVENOUS

## 2015-03-07 MED ORDER — LIDOCAINE HCL (CARDIAC) 20 MG/ML IV SOLN
INTRAVENOUS | Status: DC | PRN
  Administered 2015-03-07: 50 mg via INTRAVENOUS

## 2015-03-07 MED ORDER — FENTANYL CITRATE (PF) 100 MCG/2ML IJ SOLN
INTRAMUSCULAR | Status: DC | PRN
Start: 2015-03-07 — End: 2015-03-07
  Administered 2015-03-07: 50 ug via INTRAVENOUS

## 2015-03-07 MED ORDER — CEFAZOLIN SODIUM-DEXTROSE 2-3 GM-% IV SOLR
2.0000 g | INTRAVENOUS | Status: AC
  Administered 2015-03-07: 2 g via INTRAVENOUS
  Filled 2015-03-07: qty 50

## 2015-03-07 MED ORDER — OXYCODONE HCL 5 MG PO TABS
ORAL_TABLET | ORAL | Status: AC
  Filled 2015-03-07: qty 1

## 2015-03-07 MED ORDER — OXYCODONE-ACETAMINOPHEN 7.5-325 MG PO TABS: 1.0000 | ORAL_TABLET | Freq: Four times a day (QID) | ORAL | Status: DC | PRN

## 2015-03-07 MED ORDER — ONDANSETRON HCL 4 MG/2ML IJ SOLN
4.0000 mg | Freq: Four times a day (QID) | INTRAMUSCULAR | Status: DC | PRN
  Filled 2015-03-07: qty 2

## 2015-03-07 MED ORDER — KETOROLAC TROMETHAMINE 30 MG/ML IJ SOLN
INTRAMUSCULAR | Status: DC | PRN
  Administered 2015-03-07: 30 mg via INTRAVENOUS

## 2015-03-07 MED ORDER — OXYCODONE HCL 5 MG PO TABS
5.0000 mg | ORAL_TABLET | Freq: Once | ORAL | Status: AC | PRN
  Administered 2015-03-07: 5 mg via ORAL
  Filled 2015-03-07: qty 1

## 2015-03-07 MED ORDER — DEXAMETHASONE SODIUM PHOSPHATE 10 MG/ML IJ SOLN
INTRAMUSCULAR | Status: DC | PRN
Start: 2015-03-07 — End: 2015-03-07
  Administered 2015-03-07: 10 mg via INTRAVENOUS

## 2015-03-07 MED ORDER — ONDANSETRON HCL 4 MG/2ML IJ SOLN
INTRAMUSCULAR | Status: AC
  Filled 2015-03-07: qty 2

## 2015-03-07 SURGICAL SUPPLY — 39 items
CANISTER SUCTION 2500CC (MISCELLANEOUS) ×2 IMPLANT
CATH ROBINSON RED A/P 16FR (CATHETERS) IMPLANT
CORD ACTIVE DISPOSABLE (ELECTRODE)
CORD ELECTRO ACTIVE DISP (ELECTRODE) IMPLANT
COVER BACK TABLE 60X90IN (DRAPES) ×2 IMPLANT
DEVICE MYOSURE CLASSIC (MISCELLANEOUS) IMPLANT
DEVICE MYOSURE LITE (MISCELLANEOUS) ×1 IMPLANT
DRAPE LG THREE QUARTER DISP (DRAPES) ×2 IMPLANT
DRSG TELFA 3X8 NADH (GAUZE/BANDAGES/DRESSINGS) IMPLANT
ELECT LOOP GYNE PRO 24FR (CUTTING LOOP)
ELECT REM PT RETURN 9FT ADLT (ELECTROSURGICAL) ×2
ELECT VAPORTRODE GRVD BAR (ELECTRODE) IMPLANT
ELECTRODE LOOP GYNE PRO 24FR (CUTTING LOOP) IMPLANT
ELECTRODE REM PT RTRN 9FT ADLT (ELECTROSURGICAL) ×1 IMPLANT
ELECTRODE VAPORCUT 22FR (ELECTROSURGICAL) IMPLANT
GLOVE BIOGEL PI IND STRL 7.5 (GLOVE) IMPLANT
GLOVE BIOGEL PI INDICATOR 7.5 (GLOVE) ×1
GLOVE SURG SS PI 7.0 STRL IVOR (GLOVE) ×4 IMPLANT
GLOVE SURG SS PI 7.5 STRL IVOR (GLOVE) ×1 IMPLANT
GOWN STRL REUS W/ TWL LRG LVL3 (GOWN DISPOSABLE) ×2 IMPLANT
GOWN STRL REUS W/TWL LRG LVL3 (GOWN DISPOSABLE) ×3 IMPLANT
IV NS IRRIG 3000ML ARTHROMATIC (IV SOLUTION) ×2 IMPLANT
KIT ROOM TURNOVER WOR (KITS) ×2 IMPLANT
LEGGING LITHOTOMY PAIR STRL (DRAPES) ×2 IMPLANT
MANIFOLD NEPTUNE II (INSTRUMENTS) IMPLANT
NDL SPNL 18GX3.5 QUINCKE PK (NEEDLE) ×1 IMPLANT
NEEDLE SPNL 18GX3.5 QUINCKE PK (NEEDLE) ×2 IMPLANT
NS IRRIG 500ML POUR BTL (IV SOLUTION) ×1 IMPLANT
PACK BASIN DAY SURGERY FS (CUSTOM PROCEDURE TRAY) ×2 IMPLANT
PAD DRESSING TELFA 3X8 NADH (GAUZE/BANDAGES/DRESSINGS) ×1 IMPLANT
PAD OB MATERNITY 4.3X12.25 (PERSONAL CARE ITEMS) ×2 IMPLANT
PAD PREP 24X48 CUFFED NSTRL (MISCELLANEOUS) ×2 IMPLANT
SEAL ROD LENS SCOPE MYOSURE (ABLATOR) ×2 IMPLANT
SYR CONTROL 10ML LL (SYRINGE) ×2 IMPLANT
TOWEL OR 17X24 6PK STRL BLUE (TOWEL DISPOSABLE) ×4 IMPLANT
TUBE CONNECTING 12X1/4 (SUCTIONS) IMPLANT
TUBING AQUILEX INFLOW (TUBING) ×2 IMPLANT
TUBING AQUILEX OUTFLOW (TUBING) ×2 IMPLANT
WATER STERILE IRR 500ML POUR (IV SOLUTION) ×2 IMPLANT

## 2015-03-07 NOTE — Anesthesia Procedure Notes (Signed)
Procedure Name: MAC Date/Time: 03/07/2015 7:28 AM Performed by: Wanita Chamberlain Pre-anesthesia Checklist: Patient identified, Timeout performed, Emergency Drugs available, Suction available and Patient being monitored Patient Re-evaluated:Patient Re-evaluated prior to inductionOxygen Delivery Method: Nasal cannula Intubation Type: IV induction Placement Confirmation: positive ETCO2 Dental Injury: Teeth and Oropharynx as per pre-operative assessment

## 2015-03-07 NOTE — Brief Op Note (Signed)
03/07/2015  8:01 AM  PATIENT:  [name]  60 y.o. female  PRE-OPERATIVE DIAGNOSIS:  POLYP  POST-OPERATIVE DIAGNOSIS:  POLYP  PROCEDURE:  Procedure(s): DILATATION & CURETTAGE/HYSTEROSCOPY WITH MYOSURE (POLYP) (N/A)  SURGEON:  Surgeon(s) and Role:    * Arvella Nigh, MD - Primary  PHYSICIAN ASSISTANT:   ASSISTANTS: none   ANESTHESIA:   IV sedation and paracervical block  EBL:  Total I/O In: -  Out: 25 [Blood:25]  BLOOD ADMINISTERED:none  DRAINS: none   LOCAL MEDICATIONS USED:  XYLOCAINE   SPECIMEN:  Source of Specimen:  endometrial resections and currettings  DISPOSITION OF SPECIMEN:  PATHOLOGY  COUNTS:  YES  TOURNIQUET:  * No tourniquets in log *  DICTATION: .Other Dictation: Dictation Number [id number]  PLAN OF CARE: Discharge to home after PACU  PATIENT DISPOSITION:  PACU - hemodynamically stable.   Delay start of Pharmacological VTE agent (>24hrs) due to surgical blood loss or risk of bleeding: not applicable

## 2015-03-07 NOTE — Anesthesia Preprocedure Evaluation (Addendum)
Anesthesia Evaluation  Patient identified by MRN, date of birth, ID band Patient awake    Reviewed: Allergy & Precautions, NPO status , Patient's Chart, lab work & pertinent test results  Airway Mallampati: II   Neck ROM: full    Dental   Pulmonary sleep apnea , former smoker,    breath sounds clear to auscultation       Cardiovascular Exercise Tolerance: Poor hypertension, Pt. on medications  Rhythm:regular Rate:Normal  03-31-14 EKG Sinus rhythm Prolonged PR interval Minimal ST elevation, inferior leads Baseline wander in lead(s) I II aVR aVL aVF When compared with ECG of \\10 /18/2015, No significant change was found   Neuro/Psych Depression    GI/Hepatic GERD  Medicated and Controlled,  Endo/Other  Hypothyroidism obese  Renal/GU      Musculoskeletal  (+) Arthritis ,   Abdominal   Peds  Hematology   Anesthesia Other Findings   Reproductive/Obstetrics                           Anesthesia Physical Anesthesia Plan  ASA: II  Anesthesia Plan: MAC   Post-op Pain Management:    Induction: Intravenous  Airway Management Planned: Simple Face Mask  Additional Equipment:   Intra-op Plan:   Post-operative Plan:   Informed Consent: I have reviewed the patients History and Physical, chart, labs and discussed the procedure including the risks, benefits and alternatives for the proposed anesthesia with the patient or authorized representative who has indicated his/her understanding and acceptance.     Plan Discussed with: CRNA, Anesthesiologist and Surgeon  Anesthesia Plan Comments:        Anesthesia Quick Evaluation

## 2015-03-07 NOTE — Discharge Instructions (Signed)
° °  D & C Home care Instructions: ° ° °Personal hygiene:  Used sanitary napkins for vaginal drainage not tampons. Shower or tub bathe the day after your procedure. No douching until bleeding stops. Always wipe from front to back after  Elimination. ° °Activity: Do not drive or operate any equipment today. The effects of the anesthesia are still present and drowsiness may result. Rest today, not necessarily flat bed rest, just take it easy. You may resume your normal activity in one to 2 days. ° °Sexual activity: No intercourse for one week or as indicated by your physician ° °Diet: Eat a light diet as desired this evening. You may resume a regular diet tomorrow. ° °Return to work: One to 2 days. ° °General Expectations of your surgery: Vaginal bleeding should be no heavier than a normal period. Spotting may continue up to 10 days. Mild cramps may continue for a couple of days. You may have a regular period in 2-6 weeks. ° °Unexpected observations call your doctor if these occur: persistent or heavy bleeding. Severe abdominal cramping or pain. Elevation of temperature greater than 100°F. ° °Call for an appointment in one week. ° ° ° ° °Post Anesthesia Home Care Instructions ° °Activity: °Get plenty of rest for the remainder of the day. A responsible adult should stay with you for 24 hours following the procedure.  °For the next 24 hours, DO NOT: °-Drive a car °-Operate machinery °-Drink alcoholic beverages °-Take any medication unless instructed by your physician °-Make any legal decisions or sign important papers. ° °Meals: °Start with liquid foods such as gelatin or soup. Progress to regular foods as tolerated. Avoid greasy, spicy, heavy foods. If nausea and/or vomiting occur, drink only clear liquids until the nausea and/or vomiting subsides. Call your physician if vomiting continues. ° °Special Instructions/Symptoms: °Your throat may feel dry or sore from the anesthesia or the breathing tube placed in your throat  during surgery. If this causes discomfort, gargle with warm salt water. The discomfort should disappear within 24 hours. ° °If you had a scopolamine patch placed behind your ear for the management of post- operative nausea and/or vomiting: ° °1. The medication in the patch is effective for 72 hours, after which it should be removed.  Wrap patch in a tissue and discard in the trash. Wash hands thoroughly with soap and water. °2. You may remove the patch earlier than 72 hours if you experience unpleasant side effects which may include dry mouth, dizziness or visual disturbances. °3. Avoid touching the patch. Wash your hands with soap and water after contact with the patch. °  ° °

## 2015-03-07 NOTE — Transfer of Care (Signed)
Immediate Anesthesia Transfer of Care Note  Patient: Lydia Walsh  Procedure(s) Performed: Procedure(s): DILATATION & CURETTAGE/HYSTEROSCOPY WITH MYOSURE (POLYP) (N/A)  Patient Location: PACU  Anesthesia Type:MAC  Level of Consciousness: awake, alert , oriented and patient cooperative  Airway & Oxygen Therapy: Patient Spontanous Breathing and Patient connected to nasal cannula oxygen  Post-op Assessment: Report given to RN and Post -op Vital signs reviewed and stable  Post vital signs: Reviewed and stable  Last Vitals:  Filed Vitals:   03/07/15 0615  BP: 154/73  Pulse: 89  Temp: 37.2 C  Resp: 16    Complications: No apparent anesthesia complications

## 2015-03-07 NOTE — Transfer of Care (Signed)
Immediate Anesthesia Transfer of Care Note  Patient: Lydia Walsh  Procedure(s) Performed: Procedure(s): DILATATION & CURETTAGE/HYSTEROSCOPY WITH MYOSURE (POLYP) (N/A)  Patient Location: PACU  Anesthesia Type:MAC  Level of Consciousness: awake, alert , oriented and patient cooperative  Airway & Oxygen Therapy: Patient Spontanous Breathing and Patient connected to nasal cannula oxygen BP 160/82 93-20 Sa O2 100%  Post-op Assessment: Report given to RN and Post -op Vital signs reviewed and stable  Post vital signs: Reviewed and stable  Last Vitals:  Filed Vitals:   03/07/15 0615  BP: 154/73  Pulse: 89  Temp: 37.2 C  Resp: 16    Complications: No apparent anesthesia complications [id number] Sa O2 100%

## 2015-03-07 NOTE — Anesthesia Postprocedure Evaluation (Signed)
Anesthesia Post Note  Patient: Lydia Walsh  Procedure(s) Performed: Procedure(s) (LRB): DILATATION & CURETTAGE/HYSTEROSCOPY WITH MYOSURE (POLYP) (N/A)  Patient location during evaluation: PACU Anesthesia Type: MAC Level of consciousness: awake and alert Pain management: pain level controlled Vital Signs Assessment: post-procedure vital signs reviewed and stable Respiratory status: spontaneous breathing, nonlabored ventilation, respiratory function stable and patient connected to nasal cannula oxygen Cardiovascular status: stable and blood pressure returned to baseline Anesthetic complications: no    Last Vitals:  Filed Vitals:   03/07/15 0830 03/07/15 0845  BP: 162/80   Pulse: 85 87  Temp:    Resp: 19 18    Last Pain:  Filed Vitals:   03/07/15 0846  PainSc: 2                  Levante Simones S

## 2015-03-07 NOTE — H&P (Signed)
  History and physical exam unchanged 

## 2015-03-08 ENCOUNTER — Encounter (HOSPITAL_BASED_OUTPATIENT_CLINIC_OR_DEPARTMENT_OTHER): Payer: Self-pay | Admitting: Obstetrics and Gynecology

## 2015-03-08 NOTE — Op Note (Signed)
Lydia Walsh, Lydia Walsh NO.:  [id number]  MEDICAL RECORD NO.:  [id number]  LOCATION:                                 FACILITY:  PHYSICIAN:  Darlyn Chamber, M.D.        DATE OF BIRTH:  DATE OF PROCEDURE:  03/07/2015 DATE OF DISCHARGE:                              OPERATIVE REPORT   PREOPERATIVE DIAGNOSIS:  Endometrial polyp with associated postmenopausal bleeding.  POSTOPERATIVE DIAGNOSIS:  Endometrial polyp with associated postmenopausal bleeding with multiple endometrial outgrowths.  OPERATIVE PROCEDURE:  Paracervical block.  Cervical dilation. Hysteroscopy with MyoSure resection of endometrial thickening and polyps.  Endometrial curettings.  SURGEON:  Darlyn Chamber, M.D.  ANESTHESIA:  Paracervical block and sedation.  BLOOD LOSS:  Minimal.  PACKS:  None.  DRAINS:  None.  INTRAOPERATIVE BLOOD REPLACEMENT:  None.  COMPLICATIONS:  None.  INDICATIONS:  Dictated in the history and physical.  PROCEDURE IN DETAIL:  The patient was taken to the OR and placed in supine position.  After sedation, she was placed in dorsal lithotomy position using the Allen stirrups.  The perineum and vagina were prepped out with Betadine and draped as a sterile field.  A speculum was placed in vaginal vault.  Cervix was grasped with a single-tooth tenaculum. Uterus sounded to approximately 9 cm.  Cervix serially dilated to a size 25 Pratt dilator.  Hysteroscope was introduced.  Intrauterine cavity was then using saline.  On visualization, she had actually multiple outgrowths from the uterine lining.  This included the fundus and mainly the right side of the uterus.  We brought in a small MyoSure.  We completely resected the outgrowths from the fundus as well as the uterine wall.  These were all sent for pathological review.  We had no signs of perforation or complications.  Deficit was a bit elevated over 1000, but there was a lot on the floor so we figured. It was under  a 1000.  There were no signs of perforation.  At this point in time, the hysteroscope MyoSure was removed.  Endometrial curettings were obtained. All sent for pathological review.  Single-tooth tenaculum were removed.  She had minimal bleeding at this point in time.  The patient taken out of the dorsal lithotomy position.  Once alert, transferred to recovery room in good condition.  Sponge, instrument, and needle count was reported as correct by the circulating nurse.     Darlyn Chamber, M.D.     JSM/MEDQ  D:  03/07/2015  T:  03/07/2015  Job:  [id number]

## 2015-03-28 ENCOUNTER — Other Ambulatory Visit: Payer: Self-pay | Admitting: Internal Medicine

## 2015-03-28 NOTE — Telephone Encounter (Signed)
Pt needs appt. asap

## 2015-03-29 ENCOUNTER — Encounter: Payer: Self-pay | Admitting: Gynecologic Oncology

## 2015-03-29 ENCOUNTER — Ambulatory Visit: Payer: BLUE CROSS/BLUE SHIELD | Attending: Gynecologic Oncology | Admitting: Gynecologic Oncology

## 2015-03-29 VITALS — BP 141/68 | HR 79 | Temp 97.5°F | Resp 18 | Ht 60.0 in | Wt 176.8 lb

## 2015-03-29 DIAGNOSIS — I10 Essential (primary) hypertension: Secondary | ICD-10-CM | POA: Insufficient documentation

## 2015-03-29 DIAGNOSIS — Z6835 Body mass index (BMI) 35.0-35.9, adult: Secondary | ICD-10-CM | POA: Diagnosis not present

## 2015-03-29 DIAGNOSIS — E669 Obesity, unspecified: Secondary | ICD-10-CM

## 2015-03-29 DIAGNOSIS — C541 Malignant neoplasm of endometrium: Secondary | ICD-10-CM | POA: Diagnosis not present

## 2015-03-29 DIAGNOSIS — Z87891 Personal history of nicotine dependence: Secondary | ICD-10-CM | POA: Diagnosis not present

## 2015-03-29 DIAGNOSIS — E663 Overweight: Secondary | ICD-10-CM | POA: Insufficient documentation

## 2015-03-29 DIAGNOSIS — G4733 Obstructive sleep apnea (adult) (pediatric): Secondary | ICD-10-CM | POA: Insufficient documentation

## 2015-03-29 DIAGNOSIS — E785 Hyperlipidemia, unspecified: Secondary | ICD-10-CM | POA: Diagnosis not present

## 2015-03-29 DIAGNOSIS — E039 Hypothyroidism, unspecified: Secondary | ICD-10-CM | POA: Diagnosis not present

## 2015-03-29 DIAGNOSIS — K219 Gastro-esophageal reflux disease without esophagitis: Secondary | ICD-10-CM | POA: Diagnosis not present

## 2015-03-29 DIAGNOSIS — D259 Leiomyoma of uterus, unspecified: Secondary | ICD-10-CM | POA: Diagnosis not present

## 2015-03-29 NOTE — Patient Instructions (Signed)
Preparing for your Surgery  Plan for surgery on April 11 with Dr. Janie Morning.  You will be scheduled for a robotic assisted total hysterectomy, bilateral salpingo-oophorectomy, with sentinel lymph node biopsy.  Pre-operative Testing -You will receive a phone call from presurgical testing at [facility] to arrange for a pre-operative testing appointment before your surgery.  This appointment normally occurs one to two weeks before your scheduled surgery.   -Bring your insurance card, copy of an advanced directive if applicable, medication list  -At that visit, you will be asked to sign a consent for a possible blood transfusion in case a transfusion becomes necessary during surgery.  The need for a blood transfusion is rare but having consent is a necessary part of your care.     -You should not be taking blood thinners or aspirin at least ten days prior to surgery unless instructed by your surgeon.  Day Before Surgery at [facility] will be asked to take in only clear liquids the day before surgery.  Examples of clear liquids include broths, jello, and clear juices.  Avoid carbonated beverages.  You will be advised to have nothing to eat or drink after midnight the evening before.    Your role in recovery Your role is to become active as soon as directed by your doctor, while still giving yourself time to heal.  Rest when you feel tired. You will be asked to do the following in order to speed your recovery:  - Cough and breathe deeply. This helps toclear and expand your lungs and can prevent pneumonia. You may be given a spirometer to practice deep breathing. A staff member will show you how to use the spirometer. - Do mild physical activity. Walking or moving your legs help your circulation and body functions return to normal. A staff member will help you when you try to walk and will provide you with simple exercises. Do not try to get up or walk alone the first  time. - Actively manage your pain. Managing your pain lets you move in comfort. We will ask you to rate your pain on a scale of zero to 10. It is your responsibility to tell your doctor or nurse where and how much you hurt so your pain can be treated.  Special Considerations -If you are diabetic, you may be placed on insulin after surgery to have closer control over your blood sugars to promote healing and recovery.  This does not mean that you will be discharged on insulin.  If applicable, your oral antidiabetics will be resumed when you are tolerating a solid diet.  -Your final pathology results from surgery should be available by the Friday after surgery and the results will be relayed to you when available.  Blood Transfusion Information WHAT IS A BLOOD TRANSFUSION? A transfusion is the replacement of blood or some of its parts. Blood is made up of multiple cells which provide different functions.  Red blood cells carry oxygen and are used for blood loss replacement.  White blood cells fight against infection.  Platelets control bleeding.  Plasma helps clot blood.  Other blood products are available for specialized needs, such as hemophilia or other clotting disorders. BEFORE THE TRANSFUSION  Who gives blood for transfusions?   You may be able to donate blood to be used at a later date on yourself (autologous donation).  Relatives can be asked to donate blood. This is generally not any safer than if you have received blood  from a stranger. The same precautions are taken to ensure safety when a relative's blood is donated.  Healthy volunteers who are fully evaluated to make sure their blood is safe. This is blood bank blood. Transfusion therapy is the safest it has ever been in the practice of medicine. Before blood is taken from a donor, a complete history is taken to make sure that person has no history of diseases nor engages in risky social behavior (examples are intravenous drug  use or sexual activity with multiple partners). The donor's travel history is screened to minimize risk of transmitting infections, such as malaria. The donated blood is tested for signs of infectious diseases, such as HIV and hepatitis. The blood is then tested to be sure it is compatible with you in order to minimize the chance of a transfusion reaction. If you or a relative donates blood, this is often done in anticipation of surgery and is not appropriate for emergency situations. It takes many days to process the donated blood. RISKS AND COMPLICATIONS Although transfusion therapy is very safe and saves many lives, the main dangers of transfusion include:   Getting an infectious disease.  Developing a transfusion reaction. This is an allergic reaction to something in the blood you were given. Every precaution is taken to prevent this. The decision to have a blood transfusion has been considered carefully by your caregiver before blood is given. Blood is not given unless the benefits outweigh the risks.

## 2015-03-29 NOTE — Progress Notes (Signed)
Consult Note: Gyn-Onc  Consult was requested by Dr. Radene Knee for the evaluation of LOVELLA KUSLER 60 y.o. female  CC:  Chief Complaint  Patient presents with  . endometrial cancer    New Consultation    Assessment/Plan:  Ms. ZYIA HUICOCHEA  is a 60 y.o.  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  A detailed discussion was held with the patient and her family with regard to to her endometrial cancer diagnosis. We discussed the standard management options for uterine cancer which includes surgery followed possibly by adjuvant therapy depending on the results of surgery. The options for surgical management include a hysterectomy and removal of the tubes and ovaries possibly with removal of pelvic and para-aortic lymph nodes or the sentinel lymph node mapping technique. A minimally invasive approach including a robotic hysterectomy or laparoscopic hysterectomy have benefits including shorter hospital stay, recovery time and better wound healing. The alternative approach is an open hysterectomy. The patient has been counseled about these surgical options and the risks of surgery in general including infection, bleeding, damage to surrounding structures (including bowel, bladder, ureters, nerves or vessels), and the postoperative risks of PE/ DVT, and lymphedema. I extensively reviewed the additional risks of robotic hysterectomy including possible need for conversion to open laparotomy.  I discussed positioning during surgery of trendelenberg and risks of minor facial swelling and care we take in preoperative positioning.  After counseling and consideration of her options, she desires to proceed with robotic hysterectomy, BSO, SLN biopsy.   She will be seen by anesthesia for preoperative clearance and discussion of postoperative pain management.  She was given the opportunity to ask questions, which were answered to her satisfaction, and she is  agreement with the above mentioned plan of care.  She has a slightly globularly enlarged uterus with fibroids, but is a P3, with mobility of the uterus and I believe vaginal specimen delivery is likely.  HPI: Awbree Hoffer is a 60 year old G3P3 who is seen in consultation at the request of Dr. Radene Knee for grade 2 endometrial cancer. The patient has a history of postmenopausal bleeding for approximately 6 months this is light vaginal bleeding. She was seen by Dr. Patrice Paradise on 02/12/2015 and transvaginal ultrasound scan was performed which revealed a slightly enlarged uterus measuring 7.4 x 5 x 6.6 cm containing multiple small intramural fibroids with the largest of which being 3.5 cm. The endometrial thickness was 4 mm however a polypoid lesion measuring 6 mm was also seen in the cavity. The ovaries are grossly normal. She proceeded with a hysteroscopic resection of the endometrial polyp on 03/07/2015. This revealed a FIGO grade 2 endometrial adenocarcinoma.  The patient is otherwise fairly healthy with the exception of a history of hypertension and hypothyroidism. She is never had abdominal surgery. She's had 3 vaginal deliveries. She is overweight at 176.8 pounds and only 60 inches (BMI 35kg/m2).   Current Meds:  Outpatient Encounter Prescriptions as of 03/29/2015  Medication Sig  . allopurinol (ZYLOPRIM) 100 MG tablet Take 1 tablet (100 mg total) by mouth daily. (Patient taking differently: Take 100 mg by mouth every evening. )  . amLODipine (NORVASC) 10 MG tablet Take 1 tablet (10 mg total) by mouth daily. (Patient taking differently: Take 10 mg by mouth every evening. )  . ibuprofen (ADVIL,MOTRIN) 200 MG tablet Take 200 mg by mouth every 6 (six) hours as needed.  Marland Kitchen levothyroxine (SYNTHROID, LEVOTHROID) 112 MCG tablet Take  1 tablet (112 mcg total) by mouth daily. (Patient taking differently: Take 112 mcg by mouth daily before breakfast. )  . oxyCODONE-acetaminophen (PERCOCET) 7.5-325 MG tablet Take 1  tablet by mouth every 6 (six) hours as needed for severe pain.  . simvastatin (ZOCOR) 20 MG tablet Take 1 tablet (20 mg total) by mouth every evening.  Marland Kitchen spironolactone (ALDACTONE) 25 MG tablet TAKE ONE TABLET BY MOUTH ONCE DAILY (Patient taking differently: TAKE ONE TABLET BY MOUTH ONCE DAILY---  takes in pm)   No facility-administered encounter medications on file as of 03/29/2015.    Allergy:  Allergies  Allergen Reactions  . Hctz [Hydrochlorothiazide] Rash    Social Hx:   Social History   Social History  . Marital Status: Divorced    Spouse Name: N/A  . Number of Children: N/A  . Years of Education: N/A   Occupational History  . Not on file.   Social History Main Topics  . Smoking status: Former Smoker -- 2 years    Types: Cigarettes    Quit date: 02/28/1994  . Smokeless tobacco: Never Used  . Alcohol Use: No  . Drug Use: No  . Sexual Activity: Not on file   Other Topics Concern  . Not on file   Social History Narrative   Lives with her son.  Perimenopausal.    Past Surgical Hx:  Past Surgical History  Procedure Laterality Date  . Knee arthroscopy w/ meniscectomy Bilateral right 04-12-2006//  left 12-29-2006    and Chondroplasty  . Excision benign right breast mass  age 80  . Dilatation & currettage/hysteroscopy with resectocope  12-13-2002  &  09-26-2004    polyp/  submucosal fibroid  . Transthoracic echocardiogram  04-02-2010    normal LV, ef 65%  . Tubal ligation  1980's  . Dilatation & curettage/hysteroscopy with myosure N/A 03/07/2015    Procedure: DILATATION & CURETTAGE/HYSTEROSCOPY WITH MYOSURE (POLYP);  Surgeon: Arvella Nigh, MD;  Location: [facility];  Service: Gynecology;  Laterality: N/A;    Past Medical Hx:  Past Medical History  Diagnosis Date  . Hyperlipidemia   . Hypertension   . Hypothyroidism   . Osteoarthritis   . Endometrial polyp   . History of uterine fibroid   . PMB (postmenopausal bleeding)   . OSA (obstructive  sleep apnea)     moderate OSA per study 06-21-2005--  per pt does need cpap anymore never purchased it  . GERD (gastroesophageal reflux disease)   . Wears glasses   . Wears dentures     upper    Past Gynecological History:  SVD x 3  Patient's last menstrual period was 07/16/2014.  Family Hx: History reviewed. No pertinent family history.  Review of Systems:  Constitutional  Feels well,    ENT Normal appearing ears and nares bilaterally Skin/Breast  No rash, sores, jaundice, itching, dryness Cardiovascular  No chest pain, shortness of breath, or edema  Pulmonary  No cough or wheeze.  Gastro Intestinal  No nausea, vomitting, or diarrhoea. No bright red blood per rectum, no abdominal pain, change in bowel movement, or constipation.  Genito Urinary  No frequency, urgency, dysuria, + PMP B Musculo Skeletal  No myalgia, arthralgia, joint swelling or pain  Neurologic  No weakness, numbness, change in gait,  Psychology  No depression, anxiety, insomnia.   Vitals:  Blood pressure 141/68, pulse 79, temperature 97.5 F (36.4 C), temperature source Oral, resp. rate 18, height 5' (1.524 m), weight 176 lb 12.8 oz (80.196  kg), last menstrual period 07/16/2014, SpO2 100 %.  Physical Exam: WD in NAD Neck  Supple NROM, without any enlargements.  Lymph Node Survey No cervical supraclavicular or inguinal adenopathy Cardiovascular  Pulse normal rate, regularity and rhythm. S1 and S2 normal.  Lungs  Clear to auscultation bilateraly, without wheezes/crackles/rhonchi. Good air movement.  Skin  No rash/lesions/breakdown  Psychiatry  Alert and oriented to person, place, and time  Abdomen  Normoactive bowel sounds, abdomen soft, non-tender and obese without evidence of hernia.  Back No CVA tenderness Genito Urinary  Vulva/vagina: Normal external female genitalia.  No lesions. No discharge or bleeding.  Bladder/urethra:  No lesions or masses, well supported bladder  Vagina:  normal  Cervix: Normal appearing, no lesions.  Uterus: Globularly large and squat, but mobile, no parametrial involvement or nodularity.  Adnexa: no palpable masses. Rectal  Good tone, no masses no cul de sac nodularity.  Extremities  No bilateral cyanosis, clubbing or edema.   Donaciano Eva, MD  03/29/2015, 10:49 AM

## 2015-04-02 ENCOUNTER — Telehealth: Payer: Self-pay | Admitting: Gynecologic Oncology

## 2015-04-02 ENCOUNTER — Encounter: Payer: Self-pay | Admitting: Gynecologic Oncology

## 2015-04-02 NOTE — Telephone Encounter (Signed)
Called patient and informed her that her upcoming surgery would need to be moved to April 20 with Dr. Skeet Latch due to the first case taking around 4 hours leaving only room for one more case.  Agreeable with the plan.  She is going to come tomorrow and pick up an updated letter for work.

## 2015-04-04 ENCOUNTER — Ambulatory Visit (INDEPENDENT_AMBULATORY_CARE_PROVIDER_SITE_OTHER): Payer: BLUE CROSS/BLUE SHIELD | Admitting: Internal Medicine

## 2015-04-04 ENCOUNTER — Encounter: Payer: Self-pay | Admitting: Internal Medicine

## 2015-04-04 VITALS — BP 161/79 | HR 85 | Temp 98.4°F | Resp 18 | Ht 60.0 in | Wt 178.1 lb

## 2015-04-04 DIAGNOSIS — C541 Malignant neoplasm of endometrium: Secondary | ICD-10-CM

## 2015-04-04 DIAGNOSIS — E039 Hypothyroidism, unspecified: Secondary | ICD-10-CM

## 2015-04-04 DIAGNOSIS — D259 Leiomyoma of uterus, unspecified: Secondary | ICD-10-CM | POA: Diagnosis not present

## 2015-04-04 DIAGNOSIS — Z01818 Encounter for other preprocedural examination: Secondary | ICD-10-CM | POA: Diagnosis not present

## 2015-04-04 DIAGNOSIS — E785 Hyperlipidemia, unspecified: Secondary | ICD-10-CM | POA: Diagnosis not present

## 2015-04-04 DIAGNOSIS — I10 Essential (primary) hypertension: Secondary | ICD-10-CM

## 2015-04-04 NOTE — Assessment & Plan Note (Signed)
Currently on zocor 20mg  daily. Due for lipid panel. Will check today.

## 2015-04-04 NOTE — Patient Instructions (Signed)
Your procedure is a moderate risk surgery for cardiac complications. But we don't need to do any further workup right now as you are able to climb 1 flight of stairs without any chest pain or other symptoms. You are medically clear from my perspective for the surgery.  We will check your lipid today.  Follow up in 3 months.

## 2015-04-04 NOTE — Progress Notes (Signed)
   Subjective:    Patient ID: Lydia Walsh, female    DOB: 1955-11-15, 61 y.o.   MRN: [id number]  HPI  60 yo female with HTN, OSA, GERD, hypothyroidism, depression, recently diagnosed endometrial adenocarcinoma of the uterus and fibroid uterus here for check up on her chronic issues including HTN, HLD, hypothyrodism and to discuss her upcoming surgery.  Was seen by Dr. Denman George on 03/29/15, they are planning to do total robotic hysterectomy. She has appt for pre op clearance already at the [place] [facility]. She has no prior cardiac hx. Able to climb 1 flight of stairs without any chest pain or other symptoms. This is overall a moderate risk surgery for cardiac complications. However, based on her hx I would not do any further workup. I would not recommend EKG either. She is medically clear from my perspective for the surgery. If the pre-op department wants to do further testing, I will defer that to them.  HTN: taking spironolactone 25mg  daily +  amlodipine 10mg  daily. BP high today. Patient states feeling very stressed out about her upcoming surgery. Compliant with her meds.   Hypothyroidism - on 112 mcg synthyroid. TSH 8 months ago was normal. Asking for refill.  HLD - on zocor 20mg  daily. Asking for refill. Last lipid panel was on 2015. Due for lipid panel.    Review of Systems  Constitutional: Negative for fever, chills and fatigue.  HENT: Negative for congestion, sinus pressure and sore throat.   Eyes: Negative for photophobia and visual disturbance.  Respiratory: Negative for cough, chest tightness and shortness of breath.   Cardiovascular: Negative for chest pain, palpitations and leg swelling.  Gastrointestinal: Negative for abdominal pain and abdominal distention.  Endocrine: Negative for polyphagia and polyuria.  Genitourinary: Negative for dysuria and flank pain.  Musculoskeletal: Negative for back pain and arthralgias.  Skin: Negative.   Allergic/Immunologic: Negative.     Neurological: Negative for dizziness and headaches.  Hematological: Negative.   Psychiatric/Behavioral: Negative.        Objective:   Physical Exam  Constitutional: She is oriented to person, place, and time. She appears well-developed and well-nourished. No distress.  HENT:  Head: Normocephalic and atraumatic.  Mouth/Throat: Oropharynx is clear and moist. No oropharyngeal exudate.  Eyes: Pupils are equal, round, and reactive to light.  Neck: Normal range of motion. No JVD present.  Cardiovascular: Normal rate and regular rhythm.  Exam reveals no gallop and no friction rub.   No murmur heard. Has systolic ejection murmur. This is chronic per patient.  Pulmonary/Chest: Effort normal and breath sounds normal. No respiratory distress. She has no wheezes.  Abdominal: Soft. Bowel sounds are normal. She exhibits no distension. There is no tenderness.  Musculoskeletal: Normal range of motion. She exhibits no edema or tenderness.  Neurological: She is alert and oriented to person, place, and time. No cranial nerve deficit. Coordination normal.  Skin: Skin is warm and dry. She is not diaphoretic.  Psychiatric: She has a normal mood and affect. Her behavior is normal.     Filed Vitals:   04/04/15 0943  BP: 161/79  Pulse: 85  Temp: 98.4 F (36.9 C)  Resp: 18        Assessment & Plan:  See problem based a&p.

## 2015-04-04 NOTE — Progress Notes (Signed)
Internal Medicine Clinic Attending  Case discussed with Dr. Ahmed at the time of the visit.  We reviewed the resident's history and exam and pertinent patient test results.  I agree with the assessment, diagnosis, and plan of care documented in the resident's note. 

## 2015-04-04 NOTE — Assessment & Plan Note (Signed)
TSH was normal 8 months ago. Continue same dose of synthyroid 112 mcg for now.

## 2015-04-04 NOTE — Assessment & Plan Note (Signed)
Filed Vitals:   04/04/15 0943  BP: 161/79  Pulse: 85  Temp: 98.4 F (36.9 C)  Resp: 18   BP up today but this is likely to her emotional stress 2/2 to upcoming surgery. Will not go up on meds this time, will check again in 3 months after she has has surgery.  Continue amlodipine 10mg  + spironolactone 25mg  daily.

## 2015-04-04 NOTE — Assessment & Plan Note (Signed)
Patient is planned to have robotic hysterectomy on 05/02/15. This is a moderate risk surgery. She is able to do 4 METS activity (climb 1 flight of stairs) without any symptoms other than her knee pain. She has no prior cardiac hx. I don't think she needs any further workup for this.  Her BMET and CBC was normal 1 month ago. TSH was normal 8 months ago. No further workup is needed.  She is medically clear from my perspective for her surgery.

## 2015-04-05 LAB — LIPID PANEL
CHOL/HDL RATIO: 2.8 ratio (ref 0.0–4.4)
Cholesterol, Total: 219 mg/dL — ABNORMAL HIGH (ref 100–199)
HDL: 79 mg/dL (ref >39)
LDL CALC: 122 mg/dL — AB (ref 0–99)
Triglycerides: 89 mg/dL (ref 0–149)
VLDL CHOLESTEROL CAL: 18 mg/dL (ref 5–40)

## 2015-04-23 NOTE — Patient Instructions (Addendum)
Lydia Walsh  04/23/2015   Your procedure is scheduled on:  May 02, 2015  Report to [facility] Main  Entrance take [facility]  elevators to 3rd floor to  [place] at 5:30 AM.  Call this number if you have problems the morning of surgery [phone]   Remember: ONLY 1 PERSON MAY GO WITH YOU TO SHORT STAY TO GET  READY MORNING OF [place].  Do not eat food after midnight Tuesday night.  Then follow clear liquid diet until midnight Wednesday night.  Then nothing by mouth.     Take these medicines the morning of surgery with A SIP OF WATER: Allopurinol, Amlodipine (Norvasc), Levothyroxine DO NOT TAKE ANY DIABETIC MEDICATIONS DAY OF YOUR SURGERY                               You may not have any metal on your body including hair pins and              piercings  Do not wear jewelry, make-up, lotions, powders or perfumes, deodorant             Do not wear nail polish.  Do not shave  48 hours prior to surgery.             Do not bring valuables to the hospital. [place].  Contacts, dentures or bridgework may not be worn into surgery.  Leave suitcase in the car. After surgery it may be brought to your room.       Special Instructions: coughing and deep breathing exercises, leg exercises              Please read over the following fact sheets you were given: _____________________________________________________________________             [facility] - Preparing for Surgery Before surgery, you can play an important role.  Because skin is not sterile, your skin needs to be as free of germs as possible.  You can reduce the number of germs on your skin by washing with CHG (chlorahexidine gluconate) soap before surgery.  CHG is an antiseptic cleaner which kills germs and bonds with the skin to continue killing germs even after washing. Please DO NOT use if you have an allergy to CHG or antibacterial soaps.   If your skin becomes reddened/irritated stop using the CHG and inform your nurse when you arrive at Short Stay. Do not shave (including legs and underarms) for at least 48 hours prior to the first CHG shower.  You may shave your face/neck. Please follow these instructions carefully:  1.  Shower with CHG Soap the night before surgery and the  morning of Surgery.  2.  If you choose to wash your hair, wash your hair first as usual with your  normal  shampoo.  3.  After you shampoo, rinse your hair and body thoroughly to remove the  shampoo.                           4.  Use CHG as you would any other liquid soap.  You can apply chg directly  to the skin and wash  Gently with a scrungie or clean washcloth.  5.  Apply the CHG Soap to your body ONLY FROM THE NECK DOWN.   Do not use on face/ open                           Wound or open sores. Avoid contact with eyes, ears mouth and genitals (private parts).                       Wash face,  Genitals (private parts) with your normal soap.             6.  Wash thoroughly, paying special attention to the area where your surgery  will be performed.  7.  Thoroughly rinse your body with warm water from the neck down.  8.  DO NOT shower/wash with your normal soap after using and rinsing off  the CHG Soap.                9.  Pat yourself dry with a clean towel.            10.  Wear clean pajamas.            11.  Place clean sheets on your bed the night of your first shower and do not  sleep with pets. Day of Surgery : Do not apply any lotions/deodorants the morning of surgery.  Please wear clean clothes to the hospital/surgery center.  FAILURE TO FOLLOW THESE INSTRUCTIONS MAY RESULT IN THE CANCELLATION OF YOUR SURGERY PATIENT SIGNATURE_________________________________  NURSE SIGNATURE__________________________________  ________________________________________________________________________    CLEAR LIQUID DIET   Foods Allowed                                                                      Foods Excluded  Coffee and tea, regular and decaf                             liquids that you cannot  Plain Jell-O in any flavor                                             see through such as: Fruit ices (not with fruit pulp)                                     milk, soups, orange juice  Iced Popsicles                                    All solid food                                 Cranberry, grape and apple juices Sports drinks like Gatorade Lightly seasoned clear broth or consume(fat free) Sugar, honey syrup  Sample Menu Breakfast  Lunch                                     Supper Cranberry juice                    Beef broth                            Chicken broth Jell-O                                     Grape juice                           Apple juice Coffee or tea                        Jell-O                                      Popsicle                                                Coffee or tea                        Coffee or tea  _____________________________________________________________________   WHAT IS A BLOOD TRANSFUSION? Blood Transfusion Information  A transfusion is the replacement of blood or some of its parts. Blood is made up of multiple cells which provide different functions.  Red blood cells carry oxygen and are used for blood loss replacement.  White blood cells fight against infection.  Platelets control bleeding.  Plasma helps clot blood.  Other blood products are available for specialized needs, such as hemophilia or other clotting disorders. BEFORE THE TRANSFUSION  Who gives blood for transfusions?   Healthy volunteers who are fully evaluated to make sure their blood is safe. This is blood bank blood. Transfusion therapy is the safest it has ever been in the practice of medicine. Before blood is taken from a donor, a complete history is taken to  make sure that person has no history of diseases nor engages in risky social behavior (examples are intravenous drug use or sexual activity with multiple partners). The donor's travel history is screened to minimize risk of transmitting infections, such as malaria. The donated blood is tested for signs of infectious diseases, such as HIV and hepatitis. The blood is then tested to be sure it is compatible with you in order to minimize the chance of a transfusion reaction. If you or a relative donates blood, this is often done in anticipation of surgery and is not appropriate for emergency situations. It takes many days to process the donated blood. RISKS AND COMPLICATIONS Although transfusion therapy is very safe and saves many lives, the main dangers of transfusion include:  1. Getting an infectious disease. 2. Developing a transfusion reaction. This is an allergic reaction to something in the blood you were given. Every precaution is taken to prevent this. The decision to have  a blood transfusion has been considered carefully by your caregiver before blood is given. Blood is not given unless the benefits outweigh the risks. AFTER THE TRANSFUSION  Right after receiving a blood transfusion, you will usually feel much better and more energetic. This is especially true if your red blood cells have gotten low (anemic). The transfusion raises the level of the red blood cells which carry oxygen, and this usually causes an energy increase.  The nurse administering the transfusion will monitor you carefully for complications. HOME CARE INSTRUCTIONS  No special instructions are needed after a transfusion. You may find your energy is better. Speak with your caregiver about any limitations on activity for underlying diseases you may have. SEEK MEDICAL CARE IF:   Your condition is not improving after your transfusion.  You develop redness or irritation at the intravenous (IV) site. SEEK IMMEDIATE MEDICAL CARE  IF:  Any of the following symptoms occur over the next 12 hours:  Shaking chills.  You have a temperature by mouth above 102 F (38.9 C), not controlled by medicine.  Chest, back, or muscle pain.  People around you feel you are not acting correctly or are confused.  Shortness of breath or difficulty breathing.  Dizziness and fainting.  You get a rash or develop hives.  You have a decrease in urine output.  Your urine turns a dark color or changes to pink, red, or brown. Any of the following symptoms occur over the next 10 days:  You have a temperature by mouth above 102 F (38.9 C), not controlled by medicine.  Shortness of breath.  Weakness after normal activity.  The white part of the eye turns yellow (jaundice).  You have a decrease in the amount of urine or are urinating less often.  Your urine turns a dark color or changes to pink, red, or brown. Document Released: 12/27/1999 Document Revised: 03/23/2011 Document Reviewed: 08/15/2007 ExitCare Patient Information 2014 [place].  _______________________________________________________________________  Incentive Spirometer  An incentive spirometer is a tool that can help keep your lungs clear and active. This tool measures how well you are filling your lungs with each breath. Taking long deep breaths may help reverse or decrease the chance of developing breathing (pulmonary) problems (especially infection) following:  A long period of time when you are unable to move or be active. BEFORE THE PROCEDURE   If the spirometer includes an indicator to show your best effort, your nurse or respiratory therapist will set it to a desired goal.  If possible, sit up straight or lean slightly forward. Try not to slouch.  Hold the incentive spirometer in an upright position. INSTRUCTIONS FOR USE  3. Sit on the edge of your bed if possible, or sit up as far as you can in bed or on a chair. 4. Hold the incentive  spirometer in an upright position. 5. Breathe out normally. 6. Place the mouthpiece in your mouth and seal your lips tightly around it. 7. Breathe in slowly and as deeply as possible, raising the piston or the ball toward the top of the column. 8. Hold your breath for 3-5 seconds or for as long as possible. Allow the piston or ball to fall to the bottom of the column. 9. Remove the mouthpiece from your mouth and breathe out normally. 10. Rest for a few seconds and repeat Steps 1 through 7 at least 10 times every 1-2 hours when you are awake. Take your time and take a few normal breaths between  deep breaths. 11. The spirometer may include an indicator to show your best effort. Use the indicator as a goal to work toward during each repetition. 12. After each set of 10 deep breaths, practice coughing to be sure your lungs are clear. If you have an incision (the cut made at the time of surgery), support your incision when coughing by placing a pillow or rolled up towels firmly against it. Once you are able to get out of bed, walk around indoors and cough well. You may stop using the incentive spirometer when instructed by your caregiver.  RISKS AND COMPLICATIONS  Take your time so you do not get dizzy or light-headed.  If you are in pain, you may need to take or ask for pain medication before doing incentive spirometry. It is harder to take a deep breath if you are having pain. AFTER USE  Rest and breathe slowly and easily.  It can be helpful to keep track of a log of your progress. Your caregiver can provide you with a simple table to help with this. If you are using the spirometer at home, follow these instructions: [place] IF:   You are having difficultly using the spirometer.  You have trouble using the spirometer as often as instructed.  Your pain medication is not giving enough relief while using the spirometer.  You develop fever of 100.5 F (38.1 C) or higher. SEEK  IMMEDIATE MEDICAL CARE IF:   You cough up bloody sputum that had not been present before.  You develop fever of 102 F (38.9 C) or greater.  You develop worsening pain at or near the incision site. MAKE SURE YOU:   Understand these instructions.  Will watch your condition.  Will get help right away if you are not doing well or get worse. Document Released: 05/11/2006 Document Revised: 03/23/2011 Document Reviewed: 07/12/2006 [facility] Patient Information 2014 [place], [place].   ________________________________________________________________________

## 2015-04-25 ENCOUNTER — Encounter (HOSPITAL_COMMUNITY): Payer: Self-pay

## 2015-04-25 ENCOUNTER — Ambulatory Visit (HOSPITAL_COMMUNITY)
Admission: RE | Admit: 2015-04-25 | Discharge: 2015-04-25 | Disposition: A | Discharge status: Home / Self Care | Payer: BLUE CROSS/BLUE SHIELD | Source: Ambulatory Visit | Attending: Gynecologic Oncology | Admitting: Gynecologic Oncology

## 2015-04-25 ENCOUNTER — Encounter (HOSPITAL_COMMUNITY)
Admission: RE | Admit: 2015-04-25 | Discharge: 2015-04-25 | Disposition: A | Discharge status: Home / Self Care | Payer: BLUE CROSS/BLUE SHIELD | Source: Ambulatory Visit | Attending: Gynecologic Oncology | Admitting: Gynecologic Oncology

## 2015-04-25 ENCOUNTER — Encounter: Payer: Self-pay | Admitting: Gynecologic Oncology

## 2015-04-25 DIAGNOSIS — Z01818 Encounter for other preprocedural examination: Secondary | ICD-10-CM | POA: Diagnosis not present

## 2015-04-25 DIAGNOSIS — C541 Malignant neoplasm of endometrium: Secondary | ICD-10-CM | POA: Insufficient documentation

## 2015-04-25 DIAGNOSIS — F17210 Nicotine dependence, cigarettes, uncomplicated: Secondary | ICD-10-CM | POA: Diagnosis not present

## 2015-04-25 HISTORY — DX: Malignant (primary) neoplasm, unspecified: C80.1

## 2015-04-25 HISTORY — DX: Cardiac murmur, unspecified: R01.1

## 2015-04-25 LAB — CBC WITH DIFFERENTIAL/PLATELET
BASOS ABS: 0 10*3/uL (ref 0.0–0.1)
Basophils Relative: 0 %
EOS PCT: 2 %
Eosinophils Absolute: 0.1 10*3/uL (ref 0.0–0.7)
HEMATOCRIT: 39.2 % (ref 36.0–46.0)
Hemoglobin: 13.1 g/dL (ref 12.0–15.0)
LYMPHS ABS: 1.6 10*3/uL (ref 0.7–4.0)
LYMPHS PCT: 24 %
MCH: 27.6 pg (ref 26.0–34.0)
MCHC: 33.4 g/dL (ref 30.0–36.0)
MCV: 82.7 fL (ref 78.0–100.0)
MONO ABS: 0.3 10*3/uL (ref 0.1–1.0)
Monocytes Relative: 5 %
NEUTROS ABS: 4.6 10*3/uL (ref 1.7–7.7)
Neutrophils Relative %: 69 %
Platelets: 257 10*3/uL (ref 150–400)
RBC: 4.74 MIL/uL (ref 3.87–5.11)
RDW: 13.9 % (ref 11.5–15.5)
WBC: 6.7 10*3/uL (ref 4.0–10.5)

## 2015-04-25 LAB — URINE MICROSCOPIC-ADD ON

## 2015-04-25 LAB — URINALYSIS, ROUTINE W REFLEX MICROSCOPIC
Bilirubin Urine: NEGATIVE
GLUCOSE, UA: NEGATIVE mg/dL
Ketones, ur: NEGATIVE mg/dL
Nitrite: NEGATIVE
PH: 7 (ref 5.0–8.0)
Protein, ur: NEGATIVE mg/dL
SPECIFIC GRAVITY, URINE: 1.016 (ref 1.005–1.030)

## 2015-04-25 LAB — COMPREHENSIVE METABOLIC PANEL
ALT: 22 U/L (ref 14–54)
AST: 25 U/L (ref 15–41)
Albumin: 4.4 g/dL (ref 3.5–5.0)
Alkaline Phosphatase: 114 U/L (ref 38–126)
Anion gap: 10 (ref 5–15)
BILIRUBIN TOTAL: 0.9 mg/dL (ref 0.3–1.2)
BUN: 17 mg/dL (ref 6–20)
CO2: 27 mmol/L (ref 22–32)
CREATININE: 0.6 mg/dL (ref 0.44–1.00)
Calcium: 9.4 mg/dL (ref 8.9–10.3)
Chloride: 105 mmol/L (ref 101–111)
Glucose, Bld: 105 mg/dL — ABNORMAL HIGH (ref 65–99)
Potassium: 3.2 mmol/L — ABNORMAL LOW (ref 3.5–5.1)
Sodium: 142 mmol/L (ref 135–145)
TOTAL PROTEIN: 7.9 g/dL (ref 6.5–8.1)

## 2015-04-25 LAB — ABO/RH: ABO/RH(D): O POS

## 2015-04-25 NOTE — Progress Notes (Signed)
04-25-15 - UA amd Microscopic lab results from preop visit on 04-25-15 faxed to Dr. Skeet Latch via [facility]

## 2015-04-25 NOTE — Progress Notes (Signed)
04-04-15 - LOV - Dr. [name] (int.med) & surgical clearance - EPIC 04-02-15 - ECHO - EPIC 03-29-15 - LOV - Dr. Denman George (onc.gyn) = EPIC

## 2015-04-26 NOTE — Progress Notes (Signed)
Patient advised to increase intake of potassium rich foods including bananas, apricots, sweet potatoes, beans, greens, etc. Verbalizing understanding.  No needs voiced.

## 2015-04-29 NOTE — Progress Notes (Signed)
04-29-15 - Talked with Dr. Orene Desanctis (anesthesia) regarding EKG.  No further instructions given

## 2015-05-01 NOTE — Anesthesia Preprocedure Evaluation (Addendum)
Anesthesia Evaluation  Patient identified by MRN, date of birth, ID band Patient awake    Reviewed: Allergy & Precautions, H&P , NPO status , Patient's Chart, lab work & pertinent test results, reviewed documented beta blocker date and time   Airway Mallampati: II  TM Distance: >3 FB Neck ROM: full    Dental no notable dental hx.    Pulmonary sleep apnea , former smoker,    Pulmonary exam normal breath sounds clear to auscultation       Cardiovascular Exercise Tolerance: Poor hypertension, Pt. on medications  Rhythm:regular Rate:Normal  03-31-14 EKG Sinus rhythm Prolonged PR interval Minimal ST elevation, inferior leads Baseline wander in lead(s) I II aVR aVL aVF When compared with ECG of \\10 /18/2015, No significant change was found   Neuro/Psych Depression    GI/Hepatic GERD  Medicated and Controlled,  Endo/Other  Hypothyroidism obese  Renal/GU      Musculoskeletal  (+) Arthritis ,   Abdominal   Peds  Hematology   Anesthesia Other Findings Hyperlipidemia Hypertension    Hypothyroidism Osteoarthritis       OSA .Marland Kitchen...moderate per study 06-21-2005- pt does need cpap  GERD     Reproductive/Obstetrics                            Anesthesia Physical  Anesthesia Plan  ASA: II  Anesthesia Plan: General   Post-op Pain Management:    Induction: Intravenous  Airway Management Planned: Simple Face Mask and Oral ETT  Additional Equipment:   Intra-op Plan:   Post-operative Plan: Extubation in OR  Informed Consent: I have reviewed the patients History and Physical, chart, labs and discussed the procedure including the risks, benefits and alternatives for the proposed anesthesia with the patient or authorized representative who has indicated his/her understanding and acceptance.   Dental Advisory Given  Plan Discussed with: CRNA, Anesthesiologist and Surgeon  Anesthesia Plan  Comments: (  Discussed general anesthesia, including possible nausea, instrumentation of airway, sore throat,pulmonary aspiration, etc. I asked if the were any outstanding questions, or  concerns before we proceeded. )        Anesthesia Quick Evaluation

## 2015-05-02 ENCOUNTER — Ambulatory Visit (HOSPITAL_COMMUNITY)
Admission: RE | Admit: 2015-05-02 | Discharge: 2015-05-03 | Disposition: A | Discharge status: Home / Self Care | Payer: BLUE CROSS/BLUE SHIELD | Source: Ambulatory Visit | Attending: Obstetrics & Gynecology | Admitting: Obstetrics & Gynecology

## 2015-05-02 ENCOUNTER — Ambulatory Visit (HOSPITAL_COMMUNITY): Payer: BLUE CROSS/BLUE SHIELD | Admitting: Anesthesiology

## 2015-05-02 ENCOUNTER — Encounter (HOSPITAL_COMMUNITY): Payer: Self-pay | Admitting: *Deleted

## 2015-05-02 ENCOUNTER — Encounter (HOSPITAL_COMMUNITY)
Admission: RE | Disposition: A | Discharge status: Home / Self Care | Payer: Self-pay | Source: Ambulatory Visit | Attending: Obstetrics & Gynecology

## 2015-05-02 DIAGNOSIS — K219 Gastro-esophageal reflux disease without esophagitis: Secondary | ICD-10-CM | POA: Insufficient documentation

## 2015-05-02 DIAGNOSIS — E039 Hypothyroidism, unspecified: Secondary | ICD-10-CM | POA: Insufficient documentation

## 2015-05-02 DIAGNOSIS — K660 Peritoneal adhesions (postprocedural) (postinfection): Secondary | ICD-10-CM | POA: Diagnosis not present

## 2015-05-02 DIAGNOSIS — C541 Malignant neoplasm of endometrium: Secondary | ICD-10-CM | POA: Diagnosis not present

## 2015-05-02 DIAGNOSIS — Z6834 Body mass index (BMI) 34.0-34.9, adult: Secondary | ICD-10-CM | POA: Insufficient documentation

## 2015-05-02 DIAGNOSIS — G4733 Obstructive sleep apnea (adult) (pediatric): Secondary | ICD-10-CM | POA: Diagnosis not present

## 2015-05-02 DIAGNOSIS — Z87891 Personal history of nicotine dependence: Secondary | ICD-10-CM | POA: Diagnosis not present

## 2015-05-02 DIAGNOSIS — E663 Overweight: Secondary | ICD-10-CM | POA: Diagnosis not present

## 2015-05-02 DIAGNOSIS — E785 Hyperlipidemia, unspecified: Secondary | ICD-10-CM | POA: Insufficient documentation

## 2015-05-02 DIAGNOSIS — I10 Essential (primary) hypertension: Secondary | ICD-10-CM | POA: Diagnosis not present

## 2015-05-02 DIAGNOSIS — Z79899 Other long term (current) drug therapy: Secondary | ICD-10-CM | POA: Diagnosis not present

## 2015-05-02 DIAGNOSIS — E669 Obesity, unspecified: Secondary | ICD-10-CM | POA: Diagnosis not present

## 2015-05-02 HISTORY — PX: ROBOTIC ASSISTED TOTAL HYSTERECTOMY WITH BILATERAL SALPINGO OOPHERECTOMY: SHX6086

## 2015-05-02 LAB — TYPE AND SCREEN
ABO/RH(D): O POS
ANTIBODY SCREEN: NEGATIVE

## 2015-05-02 SURGERY — HYSTERECTOMY, TOTAL, ROBOT-ASSISTED, LAPAROSCOPIC, WITH BILATERAL SALPINGO-OOPHORECTOMY
Anesthesia: General | Laterality: Bilateral

## 2015-05-02 MED ORDER — MIDAZOLAM HCL 2 MG/2ML IJ SOLN
INTRAMUSCULAR | Status: AC
  Filled 2015-05-02: qty 2

## 2015-05-02 MED ORDER — SUGAMMADEX SODIUM 200 MG/2ML IV SOLN
INTRAVENOUS | Status: AC
  Filled 2015-05-02: qty 2

## 2015-05-02 MED ORDER — FENTANYL CITRATE (PF) 100 MCG/2ML IJ SOLN
INTRAMUSCULAR | Status: AC
  Filled 2015-05-02: qty 2

## 2015-05-02 MED ORDER — HYDROMORPHONE HCL 1 MG/ML IJ SOLN: 0.2000 mg | INTRAMUSCULAR | Status: DC | PRN

## 2015-05-02 MED ORDER — LACTATED RINGERS IV SOLN
INTRAVENOUS | Status: DC
  Administered 2015-05-02: 12:00:00 via INTRAVENOUS

## 2015-05-02 MED ORDER — SIMVASTATIN 20 MG PO TABS
20.0000 mg | ORAL_TABLET | Freq: Every day | ORAL | Status: DC
  Administered 2015-05-02: 20 mg via ORAL
  Filled 2015-05-02 (×2): qty 1

## 2015-05-02 MED ORDER — MIDAZOLAM HCL 5 MG/5ML IJ SOLN
INTRAMUSCULAR | Status: DC | PRN
  Administered 2015-05-02: 2 mg via INTRAVENOUS

## 2015-05-02 MED ORDER — EPHEDRINE SULFATE 50 MG/ML IJ SOLN
INTRAMUSCULAR | Status: AC
  Filled 2015-05-02: qty 1

## 2015-05-02 MED ORDER — ONDANSETRON HCL 4 MG PO TABS: 4.0000 mg | ORAL_TABLET | Freq: Four times a day (QID) | ORAL | Status: DC | PRN

## 2015-05-02 MED ORDER — ACETAMINOPHEN 10 MG/ML IV SOLN
INTRAVENOUS | Status: AC
  Filled 2015-05-02: qty 100

## 2015-05-02 MED ORDER — HEPARIN SODIUM (PORCINE) 5000 UNIT/ML IJ SOLN
5000.0000 [IU] | INTRAMUSCULAR | Status: AC
  Administered 2015-05-02: 5000 [IU] via SUBCUTANEOUS
  Filled 2015-05-02: qty 1

## 2015-05-02 MED ORDER — KETOROLAC TROMETHAMINE 15 MG/ML IJ SOLN
15.0000 mg | Freq: Four times a day (QID) | INTRAMUSCULAR | Status: AC
Start: 2015-05-02 — End: 2015-05-03
  Administered 2015-05-02 – 2015-05-03 (×4): 15 mg via INTRAVENOUS
  Filled 2015-05-02 (×4): qty 1

## 2015-05-02 MED ORDER — LIDOCAINE HCL (CARDIAC) 20 MG/ML IV SOLN
INTRAVENOUS | Status: DC | PRN
  Administered 2015-05-02: 100 mg via INTRAVENOUS

## 2015-05-02 MED ORDER — SUCCINYLCHOLINE CHLORIDE 20 MG/ML IJ SOLN
INTRAMUSCULAR | Status: DC | PRN
  Administered 2015-05-02: 100 mg via INTRAVENOUS

## 2015-05-02 MED ORDER — LIDOCAINE HCL (CARDIAC) 20 MG/ML IV SOLN
INTRAVENOUS | Status: AC
  Filled 2015-05-02: qty 5

## 2015-05-02 MED ORDER — SUGAMMADEX SODIUM 200 MG/2ML IV SOLN
INTRAVENOUS | Status: DC | PRN
  Administered 2015-05-02: 160 mg via INTRAVENOUS

## 2015-05-02 MED ORDER — IBUPROFEN 800 MG PO TABS: 800.0000 mg | ORAL_TABLET | Freq: Three times a day (TID) | ORAL | Status: DC | PRN

## 2015-05-02 MED ORDER — KCL IN DEXTROSE-NACL 20-5-0.45 MEQ/L-%-% IV SOLN
INTRAVENOUS | Status: DC
  Administered 2015-05-02: 14:00:00 via INTRAVENOUS
  Administered 2015-05-03: 50 mL/h via INTRAVENOUS
  Filled 2015-05-02 (×2): qty 1000

## 2015-05-02 MED ORDER — KETOROLAC TROMETHAMINE 15 MG/ML IJ SOLN
15.0000 mg | Freq: Four times a day (QID) | INTRAMUSCULAR | Status: DC
Start: 2015-05-02 — End: 2015-05-02

## 2015-05-02 MED ORDER — STERILE WATER FOR IRRIGATION IR SOLN
Status: DC | PRN
  Administered 2015-05-02: 1000 mL

## 2015-05-02 MED ORDER — ONDANSETRON HCL 4 MG/2ML IJ SOLN
INTRAMUSCULAR | Status: DC | PRN
  Administered 2015-05-02: 4 mg via INTRAVENOUS

## 2015-05-02 MED ORDER — FENTANYL CITRATE (PF) 100 MCG/2ML IJ SOLN
INTRAMUSCULAR | Status: DC | PRN
  Administered 2015-05-02 (×5): 50 ug via INTRAVENOUS

## 2015-05-02 MED ORDER — ACETAMINOPHEN 10 MG/ML IV SOLN
INTRAVENOUS | Status: DC | PRN
  Administered 2015-05-02: 1000 mg via INTRAVENOUS

## 2015-05-02 MED ORDER — ALLOPURINOL 100 MG PO TABS
100.0000 mg | ORAL_TABLET | Freq: Every day | ORAL | Status: DC
  Administered 2015-05-03: 100 mg via ORAL
  Filled 2015-05-02: qty 1

## 2015-05-02 MED ORDER — ATROPINE SULFATE 0.4 MG/ML IJ SOLN
INTRAMUSCULAR | Status: AC
  Filled 2015-05-02: qty 2

## 2015-05-02 MED ORDER — HYDROMORPHONE HCL 1 MG/ML IJ SOLN
INTRAMUSCULAR | Status: DC | PRN
  Administered 2015-05-02: 0.5 mg via INTRAVENOUS

## 2015-05-02 MED ORDER — AMLODIPINE BESYLATE 10 MG PO TABS
10.0000 mg | ORAL_TABLET | Freq: Every day | ORAL | Status: DC
  Administered 2015-05-03: 10 mg via ORAL
  Filled 2015-05-02: qty 1

## 2015-05-02 MED ORDER — HYDROMORPHONE HCL 2 MG/ML IJ SOLN
INTRAMUSCULAR | Status: AC
  Filled 2015-05-02: qty 1

## 2015-05-02 MED ORDER — DEXAMETHASONE SODIUM PHOSPHATE 10 MG/ML IJ SOLN
INTRAMUSCULAR | Status: DC | PRN
  Administered 2015-05-02: 10 mg via INTRAVENOUS

## 2015-05-02 MED ORDER — SODIUM CHLORIDE 0.9 % IJ SOLN
INTRAMUSCULAR | Status: AC
  Filled 2015-05-02: qty 10

## 2015-05-02 MED ORDER — LEVOTHYROXINE SODIUM 112 MCG PO TABS
112.0000 ug | ORAL_TABLET | Freq: Every day | ORAL | Status: DC
  Administered 2015-05-03: 112 ug via ORAL
  Filled 2015-05-02 (×2): qty 1

## 2015-05-02 MED ORDER — OXYCODONE-ACETAMINOPHEN 5-325 MG PO TABS: 1.0000 | ORAL_TABLET | ORAL | Status: DC | PRN

## 2015-05-02 MED ORDER — DEXTROSE 5 % IV SOLN
2.0000 g | INTRAVENOUS | Status: AC
  Administered 2015-05-02: 2 g via INTRAVENOUS

## 2015-05-02 MED ORDER — DEXAMETHASONE SODIUM PHOSPHATE 10 MG/ML IJ SOLN
INTRAMUSCULAR | Status: AC
  Filled 2015-05-02: qty 1

## 2015-05-02 MED ORDER — PROPOFOL 10 MG/ML IV BOLUS
INTRAVENOUS | Status: AC
  Filled 2015-05-02: qty 20

## 2015-05-02 MED ORDER — ROCURONIUM BROMIDE 100 MG/10ML IV SOLN
INTRAVENOUS | Status: DC | PRN
  Administered 2015-05-02: 30 mg via INTRAVENOUS

## 2015-05-02 MED ORDER — STERILE WATER FOR INJECTION IJ SOLN
INTRAMUSCULAR | Status: AC
  Filled 2015-05-02: qty 10

## 2015-05-02 MED ORDER — ONDANSETRON HCL 4 MG/2ML IJ SOLN: 4.0000 mg | Freq: Four times a day (QID) | INTRAMUSCULAR | Status: DC | PRN

## 2015-05-02 MED ORDER — FENTANYL CITRATE (PF) 250 MCG/5ML IJ SOLN
INTRAMUSCULAR | Status: AC
  Filled 2015-05-02: qty 5

## 2015-05-02 MED ORDER — FENTANYL CITRATE (PF) 100 MCG/2ML IJ SOLN
25.0000 ug | INTRAMUSCULAR | Status: DC | PRN
  Administered 2015-05-02: 25 ug via INTRAVENOUS

## 2015-05-02 MED ORDER — ONDANSETRON HCL 4 MG/2ML IJ SOLN
INTRAMUSCULAR | Status: AC
  Filled 2015-05-02: qty 2

## 2015-05-02 MED ORDER — CEFAZOLIN SODIUM-DEXTROSE 2-4 GM/100ML-% IV SOLN
INTRAVENOUS | Status: AC
  Filled 2015-05-02: qty 100

## 2015-05-02 MED ORDER — LACTATED RINGERS IV SOLN
INTRAVENOUS | Status: DC | PRN
  Administered 2015-05-02: 07:00:00 via INTRAVENOUS

## 2015-05-02 MED ORDER — ROCURONIUM BROMIDE 100 MG/10ML IV SOLN
INTRAVENOUS | Status: AC
  Filled 2015-05-02: qty 1

## 2015-05-02 MED ORDER — LACTATED RINGERS IR SOLN
Status: DC | PRN
  Administered 2015-05-02: 1000 mL

## 2015-05-02 MED ORDER — ENOXAPARIN SODIUM 40 MG/0.4ML ~~LOC~~ SOLN
40.0000 mg | SUBCUTANEOUS | Status: DC
  Administered 2015-05-03: 40 mg via SUBCUTANEOUS
  Filled 2015-05-02 (×2): qty 0.4

## 2015-05-02 MED ORDER — GABAPENTIN 300 MG PO CAPS
600.0000 mg | ORAL_CAPSULE | Freq: Every day | ORAL | Status: AC
  Administered 2015-05-02: 600 mg via ORAL
  Filled 2015-05-02: qty 2

## 2015-05-02 MED ORDER — PROPOFOL 10 MG/ML IV BOLUS
INTRAVENOUS | Status: DC | PRN
  Administered 2015-05-02: 150 mg via INTRAVENOUS

## 2015-05-02 SURGICAL SUPPLY — 57 items
APL SKNCLS STERI-STRIP NONHPOA (GAUZE/BANDAGES/DRESSINGS)
BAG SPEC RTRVL LRG 6X4 10 (ENDOMECHANICALS)
BENZOIN TINCTURE PRP APPL 2/3 (GAUZE/BANDAGES/DRESSINGS) ×1 IMPLANT
CHLORAPREP W/TINT 26ML (MISCELLANEOUS) ×2 IMPLANT
COVER SURGICAL LIGHT HANDLE (MISCELLANEOUS) ×2 IMPLANT
COVER TIP SHEARS 8 DVNC (MISCELLANEOUS) ×1 IMPLANT
COVER TIP SHEARS 8MM DA VINCI (MISCELLANEOUS) ×1
DRAPE ARM DVNC X/XI (DISPOSABLE) ×4 IMPLANT
DRAPE COLUMN DVNC XI (DISPOSABLE) ×1 IMPLANT
DRAPE DA VINCI XI ARM (DISPOSABLE) ×4
DRAPE DA VINCI XI COLUMN (DISPOSABLE) ×1
DRAPE SHEET LG 3/4 BI-LAMINATE (DRAPES) ×4 IMPLANT
DRAPE SURG IRRIG POUCH 19X23 (DRAPES) ×2 IMPLANT
DRSG TEGADERM 4X4.75 (GAUZE/BANDAGES/DRESSINGS) ×3 IMPLANT
ELECT REM PT RETURN 9FT ADLT (ELECTROSURGICAL) ×2
ELECTRODE REM PT RTRN 9FT ADLT (ELECTROSURGICAL) ×1 IMPLANT
GAUZE SPONGE 2X2 8PLY STRL LF (GAUZE/BANDAGES/DRESSINGS) ×2 IMPLANT
GLOVE BIO SURGEON STRL SZ 6.5 (GLOVE) ×4 IMPLANT
GLOVE BIO SURGEON STRL SZ7.5 (GLOVE) ×6 IMPLANT
GLOVE INDICATOR 8.0 STRL GRN (GLOVE) ×4 IMPLANT
GOWN STRL NON-REIN LRG LVL3 (GOWN DISPOSABLE) ×2 IMPLANT
GOWN STRL REUS W/TWL XL LVL3 (GOWN DISPOSABLE) ×4 IMPLANT
HOLDER FOLEY CATH W/STRAP (MISCELLANEOUS) ×2 IMPLANT
KIT BASIN OR (CUSTOM PROCEDURE TRAY) ×2 IMPLANT
KIT PROCEDURE DA VINCI SI (MISCELLANEOUS) ×1
KIT PROCEDURE DVNC SI (MISCELLANEOUS) IMPLANT
MANIPULATOR UTERINE 4.5 ZUMI (MISCELLANEOUS) ×1 IMPLANT
MARKER SKIN DUAL TIP RULER LAB (MISCELLANEOUS) ×2 IMPLANT
OCCLUDER COLPOPNEUMO (BALLOONS) ×2 IMPLANT
PORT ACCESS TROCAR AIRSEAL 12 (TROCAR) ×1 IMPLANT
PORT ACCESS TROCAR AIRSEAL 5M (TROCAR) ×1
POUCH SPECIMEN RETRIEVAL 10MM (ENDOMECHANICALS) IMPLANT
SEAL CANN UNIV 5-8 DVNC XI (MISCELLANEOUS) ×4 IMPLANT
SEAL XI 5MM-8MM UNIVERSAL (MISCELLANEOUS) ×4
SET TRI-LUMEN FLTR TB AIRSEAL (TUBING) ×2 IMPLANT
SET TUBE IRRIG SUCTION NO TIP (IRRIGATION / IRRIGATOR) ×2 IMPLANT
SHEET LAVH (DRAPES) ×2 IMPLANT
SOLUTION ELECTROLUBE (MISCELLANEOUS) ×2 IMPLANT
SPONGE GAUZE 2X2 STER 10/PKG (GAUZE/BANDAGES/DRESSINGS) ×2
SPONGE LAP 18X18 X RAY DECT (DISPOSABLE) IMPLANT
STRIP CLOSURE SKIN 1/2X4 (GAUZE/BANDAGES/DRESSINGS) ×1 IMPLANT
SUT VIC AB 0 CT1 27 (SUTURE) ×2
SUT VIC AB 0 CT1 27XBRD ANTBC (SUTURE) ×1 IMPLANT
SUT VIC AB 3-0 SH 27 (SUTURE) ×2
SUT VIC AB 3-0 SH 27XBRD (SUTURE) IMPLANT
SUT VIC AB 4-0 PS2 27 (SUTURE) ×4 IMPLANT
SUT VLOC 180 0 9IN  GS21 (SUTURE)
SUT VLOC 180 0 9IN GS21 (SUTURE) IMPLANT
SYR 50ML LL SCALE MARK (SYRINGE) ×2 IMPLANT
SYR BULB IRRIGATION 50ML (SYRINGE) IMPLANT
TOWEL OR 17X26 10 PK STRL BLUE (TOWEL DISPOSABLE) ×2 IMPLANT
TOWEL OR NON WOVEN STRL DISP B (DISPOSABLE) ×2 IMPLANT
TRAP SPECIMEN MUCOUS 40CC (MISCELLANEOUS) IMPLANT
TRAY FOLEY W/METER SILVER 14FR (SET/KITS/TRAYS/PACK) ×2 IMPLANT
TRAY LAPAROSCOPIC (CUSTOM PROCEDURE TRAY) ×2 IMPLANT
TROCAR BLADELESS OPT 5 100 (ENDOMECHANICALS) ×2 IMPLANT
WATER STERILE IRR 1500ML POUR (IV SOLUTION) ×2 IMPLANT

## 2015-05-02 NOTE — Interval H&P Note (Signed)
History and Physical Interval Note:  05/02/2015 7:28 AM  Lydia Walsh  has presented today for surgery, with the diagnosis of endometrial cancer  The various methods of treatment have been discussed with the patient and family. After consideration of risks, benefits and other options for treatment, the patient has consented to  Procedure(s): XI ROBOTIC ASSISTED TOTAL HYSTERECTOMY WITH BILATERAL SALPINGO OOPHORECTOMY AND SENTINAL LYMPH NODE BIOPSY (Bilateral) as a surgical intervention .  The patient's history has been reviewed, patient examined, no change in status, stable for surgery.  I have reviewed the patient's chart and labs.  Questions were answered to the patient's satisfaction.     [place], [facility]

## 2015-05-02 NOTE — Progress Notes (Signed)
Per Dr. Skeet Latch, continue to hydrate the patient with the current IVF and rate and ensure that she has an IS.- MC, NP

## 2015-05-02 NOTE — Op Note (Addendum)
OPERATIVE NOTE  Surgeon: Janie Morning   Assistants: [name] MD  Anesthesia: GET  Pre-operative Diagnosis: Grade 2 endometrial cancer  Post-operative Diagnosis:Grade 2 endometrial cancer  Operation: Robotic-assisted laparoscopic hysterectomy with bilateral salpingoophorectomy bilateral sentinel lymph node sampling, lysis of adhesions  Surgeon: Janie Morning  Assistant Surgeon: [name] MD  Anesthesia: GET  Operative Findings:  1.  No extrauterine disease. 2.  A 8 cm uterus. 3.  Normal bilateral adnexa. 4.  No evidence of any extrauterine disease. 5.  Normal appearing cervix.    Procedure Details  The patient was seen in the Holding Room. The risks, benefits, complications, treatment options, and expected outcomes were discussed with the patient.  The patient concurred with the proposed plan, giving informed consent.  The site of surgery properly noted/marked. The patient was identified as Lydia Walsh and the procedure verified as a Robotic-assisted hysterectomy with bilateral salpingo oophorectomy. A Time Out was held and the above information confirmed.  After induction of anesthesia, the patient was draped and prepped in the usual sterile manner. Pt was placed in supine position after anesthesia and draped and prepped in the usual sterile manner. The abdominal drape was placed after the CholoraPrep had been allowed to dry for 3 minutes.  Her arms were tucked to her side with all appropriate precautions.  The chest was secured to the table.  The patient was placed in the semi-lithotomy position in [place].  The perineum was prepped with Betadine. She was then prepped and draped in the usual sterile manner.  Foley catheter was placed.  A sterile speculum was placed in the vagina.  The cervix was grasped with a single-tooth tenaculum and dilated with Pratt dilators and infiltrated with green dye. At 3 and 9 o' clock.  The  uterine manipulator with a  large  colpotomizer ring was placed without difficulty.  A pneum occluder balloon was placed over the manipulator.  A second time-out was performed.  OG tube placement was confirmed and to suction.   Next, a 5 mm skin incision was made 1 cm below the subcostal margin in the midclavicular line.  The 5 mm Optiview port and scope was used for direct entry.  Opening pressure was under 10 mm CO2.  The abdomen was insufflated and the findings were noted as above.   At this point and all points during the procedure, the patient's intra-abdominal pressure did not exceed 15 mmHg. Next, a 10 mm skin incision was made about 2 cm above the umbilicus and a right and left port was placed about 9 cm lateral to the infraumbilical port.  Adhesions of the omentum to the left anterior abdominal wall were released to improve visualization.    A fourth arm was placed in the left lower quadrant 8cm lateral to the left port.    All ports were placed under direct visualization.  The patient was placed in steep Trendelenburg.  Uterine perforation was noted with bleeding from the bowel mesentery. Bowel was away into the upper abdomen.  The robot was docked in the normal manner.  The hysterectomy was started after the round ligament on the right side was incised and the retroperitoneum was entered and the pararectal space was developed.  The ureter was noted to be on the medial leaf of the broad ligament.  The right sentinel node was appreciated inferior to the left external iliac vein and was disected.  The peritoneum above the ureter was incised and stretched and the infundibulopelvic  ligament was skeletonized, cauterized and cut.  The posterior peritoneum was taken down to the level of the KOH ring.  The anterior peritoneum was also taken down.  The bladder flap was created to the level of the KOH ring.  The uterine artery on the right side was skeletonized, cauterized and cut in the normal manner.  A similar procedure was performed on  the left.  The left sentinel lymph node was appreciated on the left common iliac artery.  The colpotomy was made and the uterus, cervix, bilateral ovaries and tubes were amputated and delivered through the vagina.  Pedicles were inspected and excellent hemostasis was achieved.  The bleeding in the mesentery was controlled with a running locking suture.  No bowel injury was appreciated  The colpotomy at the vaginal cuff was closed with Vicryl on a CT1 needle in a running manner.  Irrigation was used and excellent hemostasis was achieved.  At this point in the procedure was completed.  Robotic instruments were removed under direct visulaization.  The robot was undocked and the port sites irrigated. The 10 mm left upper quadrant  Port was closed with Vicryl on a UR-5 needle and the fascia was closed with 0 Vicryl on a UR-5 needle.  All skin incisions were closed with 4-0 Vicryl in a subcuticular manner.  Dermabond was place over all incisions.  Sponge, lap and needle counts correct x 2.  The patient was taken to the recovery room in stable condition.  The vagina was swabbed with  minimal bleeding noted.   All instrument and needle counts were correct x  3.   The patient was transferred to the recovery room in stablecondition.  Estimated Blood Loss:  50        Specimens: Uterus, cervix, bilateral ovaries and tubes, bilateral sentinel lymph nodes         Complications:  None; patient tolerated the procedure well.         Disposition: PACU - hemodynamically stable.

## 2015-05-02 NOTE — H&P (Signed)
Pre Op History and Medicine: Gyn-Onc   [name] 60 y.o. female  CC:  Chief Complaint  Patient presents with  . endometrial cancer    New Consultation    Assessment/Plan:  Ms. FLORAMAE GONTHIER is a 60 y.o. 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  A detailed discussion was held with the patient and her family with regard to to her endometrial cancer diagnosis. We discussed the standard management options for uterine cancer which includes surgery followed possibly by adjuvant therapy depending on the results of surgery. The options for surgical management include a hysterectomy and removal of the tubes and ovaries possibly with removal of pelvic and para-aortic lymph nodes or the sentinel lymph node mapping technique. A minimally invasive approach including a robotic hysterectomy or laparoscopic hysterectomy have benefits including shorter hospital stay, recovery time and better wound healing. The alternative approach is an open hysterectomy. The patient has been counseled about these surgical options and the risks of surgery in general including infection, bleeding, damage to surrounding structures (including bowel, bladder, ureters, nerves or vessels), and the postoperative risks of PE/ DVT, and lymphedema. I extensively reviewed the additional risks of robotic hysterectomy including possible need for conversion to open laparotomy. I discussed positioning during surgery of trendelenberg and risks of minor facial swelling and care we take in preoperative positioning. After counseling and consideration of her options, she desires to proceed with robotic hysterectomy, BSO, SLN biopsy.   She will be seen by anesthesia for preoperative clearance and discussion of postoperative pain management. She was given the opportunity to ask questions, which were answered to her satisfaction, and she is agreement with the above mentioned plan of  care.  She has a slightly globularly enlarged uterus with fibroids, but is a P3, with mobility of the uterus and I believe vaginal specimen delivery is likely.  HPI: Janeal Poyer is a 60 year old G3P3 who is seen in consultation at the request of Dr. Radene Knee for grade 2 endometrial cancer. The patient has a history of postmenopausal bleeding for approximately 6 months this is light vaginal bleeding. She was seen by Dr. Patrice Paradise on 02/12/2015 and transvaginal ultrasound scan was performed which revealed a slightly enlarged uterus measuring 7.4 x 5 x 6.6 cm containing multiple small intramural fibroids with the largest of which being 3.5 cm. The endometrial thickness was 4 mm however a polypoid lesion measuring 6 mm was also seen in the cavity. The ovaries are grossly normal. She proceeded with a hysteroscopic resection of the endometrial polyp on 03/07/2015. This revealed a FIGO grade 2 endometrial adenocarcinoma.  The patient is otherwise fairly healthy with the exception of a history of hypertension and hypothyroidism. She is never had abdominal surgery. She's had 3 vaginal deliveries. She is overweight at 176.8 pounds and only 60 inches (BMI 35kg/m2).   Current Meds:  Outpatient Encounter Prescriptions as of 03/29/2015  Medication Sig  . allopurinol (ZYLOPRIM) 100 MG tablet Take 1 tablet (100 mg total) by mouth daily. (Patient taking differently: Take 100 mg by mouth every evening. )  . amLODipine (NORVASC) 10 MG tablet Take 1 tablet (10 mg total) by mouth daily. (Patient taking differently: Take 10 mg by mouth every evening. )  . ibuprofen (ADVIL,MOTRIN) 200 MG tablet Take 200 mg by mouth every 6 (six) hours as needed.  Marland Kitchen levothyroxine (SYNTHROID, LEVOTHROID) 112 MCG tablet Take 1 tablet (112 mcg total) by mouth daily. (Patient taking differently:  Take 112 mcg by mouth daily before breakfast. )  . oxyCODONE-acetaminophen (PERCOCET) 7.5-325 MG tablet Take 1 tablet by mouth every 6 (six)  hours as needed for severe pain.  . simvastatin (ZOCOR) 20 MG tablet Take 1 tablet (20 mg total) by mouth every evening.  Marland Kitchen spironolactone (ALDACTONE) 25 MG tablet TAKE ONE TABLET BY MOUTH ONCE DAILY (Patient taking differently: TAKE ONE TABLET BY MOUTH ONCE DAILY--- takes in pm)   No facility-administered encounter medications on file as of 03/29/2015.    Allergy:  Allergies  Allergen Reactions  . Hctz [Hydrochlorothiazide] Rash    Social Hx:  Social History   Social History  . Marital Status: Divorced    Spouse Name: N/A  . Number of Children: N/A  . Years of Education: N/A   Occupational History  . Not on file.   Social History Main Topics  . Smoking status: Former Smoker -- 2 years    Types: Cigarettes    Quit date: 02/28/1994  . Smokeless tobacco: Never Used  . Alcohol Use: No  . Drug Use: No  . Sexual Activity: Not on file   Other Topics Concern  . Not on file   Social History Narrative   Lives with her son. Perimenopausal.    Past Surgical Hx:  Past Surgical History  Procedure Laterality Date  . Knee arthroscopy w/ meniscectomy Bilateral right 04-12-2006// left 12-29-2006    and Chondroplasty  . Excision benign right breast mass  age 59  . Dilatation & currettage/hysteroscopy with resectocope  12-13-2002 & 09-26-2004    polyp/ submucosal fibroid  . Transthoracic echocardiogram  04-02-2010    normal LV, ef 65%  . Tubal ligation  1980's  . Dilatation & curettage/hysteroscopy with myosure N/A 03/07/2015    Procedure: DILATATION & CURETTAGE/HYSTEROSCOPY WITH MYOSURE (POLYP); Surgeon: Arvella Nigh, MD; Location: [facility]; Service: Gynecology; Laterality: N/A;    Past Medical Hx:  Past Medical History  Diagnosis Date  . Hyperlipidemia   . Hypertension   . Hypothyroidism   . Osteoarthritis   .  Endometrial polyp   . History of uterine fibroid   . PMB (postmenopausal bleeding)   . OSA (obstructive sleep apnea)     moderate OSA per study 06-21-2005-- per pt does need cpap anymore never purchased it  . GERD (gastroesophageal reflux disease)   . Wears glasses   . Wears dentures     upper    Past Gynecological History: SVD x 3 Patient's last menstrual period was 07/16/2014.  Family Hx: History reviewed. No pertinent family history.  Review of Systems:  Constitutional  Feels well,  ENT Normal appearing ears and nares bilaterally Skin/Breast  No rash, sores, jaundice, itching, dryness Cardiovascular  No chest pain, shortness of breath, or edema  Pulmonary  No cough or wheeze.  Gastro Intestinal  No nausea, vomitting, or diarrhoea. No bright red blood per rectum, no abdominal pain, change in bowel movement, or constipation.  Genito Urinary  No frequency, urgency, dysuria, + PMP B Musculo Skeletal  No myalgia, arthralgia, joint swelling or pain  Neurologic  No weakness, numbness, change in gait,  Psychology  No depression, anxiety, insomnia.   Vitals:BP 134/68 mmHg  Pulse 100  Temp(Src) 98.3 F (36.8 C) (Oral)  Resp 16  Ht 5' (1.524 m)  Wt 177 lb (80.287 kg)  BMI 34.57 kg/m2  SpO2 100%  LMP 07/16/2014 Body mass index is 34.57 kg/(m^2).  Physical Exam:  WD in NAD Neck  Supple NROM, without  any enlargements.  Lymph Node Survey No cervical supraclavicular or inguinal adenopathy Cardiovascular  Pulse normal rate, regularity and rhythm. S1 and S2 normal.  Lungs  Clear to auscultation bilateraly, without wheezes/crackles/rhonchi. Good air movement.  Skin  No rash/lesions/breakdown  Psychiatry  Alert and oriented to person, place, and time  Abdomen  Normoactive bowel sounds, abdomen soft, non-tender and obese without evidence of hernia.  Back No CVA tenderness Genito Urinary  Vulva/vagina: Normal  external female genitalia. No lesions. No discharge or bleeding. Bladder/urethra: No lesions or masses, well supported bladder Vagina: normal Cervix: Normal appearing, no lesions. Uterus: Globularly large and squat, but mobile, no parametrial involvement or nodularity. Adnexa: no palpable masses. Rectal  Good tone, no masses no cul de sac nodularity.  Extremities  No bilateral cyanosis, clubbing or edema.

## 2015-05-02 NOTE — Anesthesia Postprocedure Evaluation (Signed)
Anesthesia Post Note  Patient: Lydia Walsh  Procedure(s) Performed: Procedure(s) (LRB): XI ROBOTIC ASSISTED TOTAL HYSTERECTOMY WITH BILATERAL SALPINGO OOPHORECTOMY AND SENTINAL LYMPH NODE BIOPSY (Bilateral)  Patient location during evaluation: PACU Anesthesia Type: General Level of consciousness: sedated Pain management: satisfactory to patient Vital Signs Assessment: post-procedure vital signs reviewed and stable Respiratory status: spontaneous breathing Cardiovascular status: stable Anesthetic complications: no    Last Vitals:  Filed Vitals:   05/02/15 1418 05/02/15 1500  BP: 145/80 127/65  Pulse: 101 100  Temp: 38.2 C 37.4 C  Resp: 16 15    Last Pain:  Filed Vitals:   05/02/15 1501  PainSc: 0-No pain                 Giada Schoppe EDWARD

## 2015-05-02 NOTE — Transfer of Care (Signed)
Immediate Anesthesia Transfer of Care Note  Patient: Lydia Walsh  Procedure(s) Performed: Procedure(s): XI ROBOTIC ASSISTED TOTAL HYSTERECTOMY WITH BILATERAL SALPINGO OOPHORECTOMY AND SENTINAL LYMPH NODE BIOPSY (Bilateral)  Patient Location: PACU  Anesthesia Type:General  Level of Consciousness: awake, alert , oriented and patient cooperative  Airway & Oxygen Therapy: Patient Spontanous Breathing and Patient connected to face mask oxygen  Post-op Assessment: Report given to RN, Post -op Vital signs reviewed and stable and Patient moving all extremities  Post vital signs: Reviewed and stable  Last Vitals:  Filed Vitals:   05/02/15 0518  BP: 134/68  Pulse: 100  Temp: 36.8 C  Resp: 16    Complications: No apparent anesthesia complications

## 2015-05-02 NOTE — Anesthesia Procedure Notes (Signed)
Procedure Name: Intubation Date/Time: 05/02/2015 7:40 AM Performed by: Carleene Cooper A Pre-anesthesia Checklist: Patient identified, Emergency Drugs available, Suction available, Patient being monitored and Timeout performed Patient Re-evaluated:Patient Re-evaluated prior to inductionOxygen Delivery Method: Circle system utilized Preoxygenation: Pre-oxygenation with 100% oxygen Intubation Type: IV induction Ventilation: Mask ventilation without difficulty Laryngoscope Size: Mac and 4 Grade View: Grade I Tube type: Oral Tube size: 7.5 mm Number of attempts: 1 Airway Equipment and Method: Stylet Placement Confirmation: ETT inserted through vocal cords under direct vision,  breath sounds checked- equal and bilateral and positive ETCO2 Secured at: 21 cm Tube secured with: Tape Dental Injury: Teeth and Oropharynx as per pre-operative assessment

## 2015-05-02 NOTE — Progress Notes (Signed)
Lydia John NP made aware patient temp continues to be elevated post op at 100.8 Axillary.

## 2015-05-03 ENCOUNTER — Encounter (HOSPITAL_COMMUNITY): Payer: Self-pay | Admitting: Gynecologic Oncology

## 2015-05-03 DIAGNOSIS — E669 Obesity, unspecified: Secondary | ICD-10-CM | POA: Diagnosis not present

## 2015-05-03 DIAGNOSIS — G4733 Obstructive sleep apnea (adult) (pediatric): Secondary | ICD-10-CM | POA: Diagnosis not present

## 2015-05-03 DIAGNOSIS — Z87891 Personal history of nicotine dependence: Secondary | ICD-10-CM | POA: Diagnosis not present

## 2015-05-03 DIAGNOSIS — Z79899 Other long term (current) drug therapy: Secondary | ICD-10-CM | POA: Diagnosis not present

## 2015-05-03 DIAGNOSIS — Z6834 Body mass index (BMI) 34.0-34.9, adult: Secondary | ICD-10-CM | POA: Diagnosis not present

## 2015-05-03 DIAGNOSIS — E663 Overweight: Secondary | ICD-10-CM | POA: Diagnosis not present

## 2015-05-03 DIAGNOSIS — C541 Malignant neoplasm of endometrium: Secondary | ICD-10-CM | POA: Diagnosis not present

## 2015-05-03 DIAGNOSIS — K219 Gastro-esophageal reflux disease without esophagitis: Secondary | ICD-10-CM | POA: Diagnosis not present

## 2015-05-03 DIAGNOSIS — E039 Hypothyroidism, unspecified: Secondary | ICD-10-CM | POA: Diagnosis not present

## 2015-05-03 DIAGNOSIS — K660 Peritoneal adhesions (postprocedural) (postinfection): Secondary | ICD-10-CM | POA: Diagnosis not present

## 2015-05-03 DIAGNOSIS — E785 Hyperlipidemia, unspecified: Secondary | ICD-10-CM | POA: Diagnosis not present

## 2015-05-03 DIAGNOSIS — I10 Essential (primary) hypertension: Secondary | ICD-10-CM | POA: Diagnosis not present

## 2015-05-03 LAB — BASIC METABOLIC PANEL
ANION GAP: 11 (ref 5–15)
BUN: 13 mg/dL (ref 6–20)
CHLORIDE: 105 mmol/L (ref 101–111)
CO2: 24 mmol/L (ref 22–32)
Calcium: 9.4 mg/dL (ref 8.9–10.3)
Creatinine, Ser: 0.67 mg/dL (ref 0.44–1.00)
GFR calc Af Amer: >60 mL/min (ref >60)
Glucose, Bld: 139 mg/dL — ABNORMAL HIGH (ref 65–99)
POTASSIUM: 3.9 mmol/L (ref 3.5–5.1)
SODIUM: 140 mmol/L (ref 135–145)

## 2015-05-03 LAB — CBC
HCT: 34.7 % — ABNORMAL LOW (ref 36.0–46.0)
HEMOGLOBIN: 11.5 g/dL — AB (ref 12.0–15.0)
MCH: 27.8 pg (ref 26.0–34.0)
MCHC: 33.1 g/dL (ref 30.0–36.0)
MCV: 84 fL (ref 78.0–100.0)
PLATELETS: 259 10*3/uL (ref 150–400)
RBC: 4.13 MIL/uL (ref 3.87–5.11)
RDW: 14 % (ref 11.5–15.5)
WBC: 11.4 10*3/uL — AB (ref 4.0–10.5)

## 2015-05-03 MED ORDER — ENOXAPARIN (LOVENOX) PATIENT EDUCATION KIT
PACK | Freq: Once | Status: AC
  Administered 2015-05-03: 12:00:00
  Filled 2015-05-03: qty 1

## 2015-05-03 MED ORDER — OXYCODONE-ACETAMINOPHEN 5-325 MG PO TABS: 1.0000 | ORAL_TABLET | ORAL | Status: DC | PRN

## 2015-05-03 MED ORDER — ENOXAPARIN SODIUM 40 MG/0.4ML ~~LOC~~ SOLN: 40.0000 mg | SUBCUTANEOUS | Status: DC

## 2015-05-03 NOTE — Discharge Instructions (Signed)
05/03/2015  Return to work: 4-6 weeks if applicable  Activity: 1. Be up and out of the bed during the day.  Take a nap if needed.  You may walk up steps but be careful and use the hand rail.  Stair climbing will tire you more than you think, you may need to stop part way and rest.   2. No lifting or straining for 6 weeks.  3. No driving for 1 week(s).  Do not drive if you are taking narcotic pain medicine.  4. Shower daily.  Use soap and water on your incision and pat dry; don't rub.  No tub baths until cleared by your surgeon.   5. No sexual activity and nothing in the vagina for 8 weeks.  6. You may experience a small amount of clear drainage from your incisions, which is normal.  If the drainage persists or increases, please call the office.   Diet: 1. Low sodium Heart Healthy Diet is recommended.  2. It is safe to use a laxative, such as Miralax or Colace, if you have difficulty moving your bowels.   Wound Care: 1. Keep clean and dry.  Shower daily.  Reasons to call the Doctor:  Fever - Oral temperature greater than 100.4 degrees Fahrenheit  Foul-smelling vaginal discharge  Difficulty urinating  Nausea and vomiting  Increased pain at the site of the incision that is unrelieved with pain medicine.  Difficulty breathing with or without chest pain  New calf pain especially if only on one side  Sudden, continuing increased vaginal bleeding with or without clots.   Contacts: For questions or concerns you should contact:  Dr. Janie Morning or Dr. Everitt Amber at [phone]  Joylene John, NP at [phone]  After Hours: call [phone] and have the GYN Oncologist paged/contacted  How and Where to Give Subcutaneous Enoxaparin Injections Enoxaparin is an injectable medicine. It is used to help prevent blood clots from developing in your veins. Health care providers often use anticoagulants like enoxaparin to prevent clots following surgery. Enoxaparin is also used  in combination with other medicines to treat blood clots and heart attacks. If blood clots are left untreated, they can be life threatening.  Enoxaparin comes in single-use syringes. You inject enoxaparin through a syringe into your belly (abdomen). You should change the injection site each time you give yourself a shot. Continue the enoxaparin injections as directed by your health care provider. Your health care provider will use blood clotting test results to decide when you can safely stop using enoxaparin injections. If your health care provider prescribes any additional medicines, use the medicines exactly as directed. HOW DO I INJECT ENOXAPARIN?  9. Wash your hands with soap and water. 10. Clean the selected injection site as directed by your health care provider. 11. Remove the needle cap by pulling it straight off the syringe. 12. Hold the syringe like a pencil using your writing hand. 13. Use your other hand to pinch and hold an inch of the cleansed skin. 14. Insert the entire needle straight down into the fold of skin. 15. Push the plunger with your thumb until the syringe is empty. 16. Pull the needle straight out of your skin. 17. Enoxaparin injection prefilled syringes and graduated prefilled syringes are available with a system that shields the needle after injection. After you have completed your injection and removed the needle from your skin, firmly push down on the plunger. The protective sleeve will automatically cover the needle and you will hear  a click. The click means the needle is safely covered. 18. Place the syringe in the nearest needle box, also called a sharps container. If you do not have a sharps container, you can use a hard-sided plastic container with a secure lid, such as an empty laundry detergent bottle. WHAT ELSE DO I NEED TO KNOW?  Do not use enoxaparin if:  You have allergies to heparin or pork products.  You have been diagnosed with a condition called  thrombocytopenia.  Do not use the syringe or needle more than one time.  Use medicines only as directed by your health care provider.  Changes in medicines, supplements, diet, and illness can affect your anticoagulation therapy. Be sure to inform your health care provider of any of these changes.  It is important that you tell all of your health care providers and your dentist that you are taking an anticoagulant, especially if you are injured or plan to have any type of procedure.  While on anticoagulants, you will need to have blood tests done routinely as directed by your health care provider.  While using this medicine, avoid physical activities or sports that could result in a fall or cause injury.  Follow up with your laboratory test and health care provider appointments as directed. It is very important to keep your appointments. Not keeping appointments could result in a chronic or permanent injury, pain, or disability.  Before giving your medicine, you should make sure the injection is a clear and colorless or pale yellow solution. If your medicine becomes discolored or if there are particles in the syringe, do not use it and notify your health care provider.  Keep your medicine safely stored at room temperature. SEEK MEDICAL CARE IF:  You develop any rashes on your skin.  You have large areas of bruising on your skin.  You have any worsening of the condition for which you take Enoxaparin.  You develop a fever. SEEK IMMEDIATE MEDICAL CARE IF:  You develop bleeding problems such as:  Bleeding from the gums or nose that does not stop quickly.  Vomiting blood or coughing up blood.  Blood in your urine.  Blood in your stool, or stool that has a dark, tarry, or coffee grounds appearance.  A cut that does not stop bleeding within 10 minutes. These symptoms may represent a serious problem that is an emergency. Do not wait to see if the symptoms will go away. Get medical  help right away. Call your local emergency services (911 in the [place] Do not drive yourself to the hospital.    This information is not intended to replace advice given to you by your health care provider. Make sure you discuss any questions you have with your health care provider.   Document Released: 10/31/2003 Document Revised: 01/19/2014 Document Reviewed: 06/15/2013 Elsevier Interactive Patient Education 2016 [employer]   Acetaminophen; Oxycodone tablets What is this medicine? ACETAMINOPHEN; OXYCODONE (a set a MEE noe fen; ox i KOE done) is a pain reliever. It is used to treat moderate to severe pain. This medicine may be used for other purposes; ask your health care provider or pharmacist if you have questions. What should I tell my health care provider before I take this medicine? They need to know if you have any of these conditions: -brain tumor -Crohn's disease, inflammatory bowel disease, or ulcerative colitis -drug abuse or addiction -head injury -heart or circulation problems -if you often drink alcohol -kidney disease or problems going to the  bathroom -liver disease -lung disease, asthma, or breathing problems -an unusual or allergic reaction to acetaminophen, oxycodone, other opioid analgesics, other medicines, foods, dyes, or preservatives -pregnant or trying to get pregnant -breast-feeding How should I use this medicine? Take this medicine by mouth with a full glass of water. Follow the directions on the prescription label. You can take it with or without food. If it upsets your stomach, take it with food. Take your medicine at regular intervals. Do not take it more often than directed. Talk to your pediatrician regarding the use of this medicine in children. Special care may be needed. Patients over 27 years old may have a stronger reaction and need a smaller dose. Overdosage: If you think you have taken too much of this medicine contact a poison control center or  emergency room at once. NOTE: This medicine is only for you. Do not share this medicine with others. What if I miss a dose? If you miss a dose, take it as soon as you can. If it is almost time for your next dose, take only that dose. Do not take double or extra doses. What may interact with this medicine? -alcohol -antihistamines -barbiturates like amobarbital, butalbital, butabarbital, methohexital, pentobarbital, phenobarbital, thiopental, and secobarbital -benztropine -drugs for bladder problems like solifenacin, trospium, oxybutynin, tolterodine, hyoscyamine, and methscopolamine -drugs for breathing problems like ipratropium and tiotropium -drugs for certain stomach or intestine problems like propantheline, homatropine methylbromide, glycopyrrolate, atropine, belladonna, and dicyclomine -general anesthetics like etomidate, ketamine, nitrous oxide, propofol, desflurane, enflurane, halothane, isoflurane, and sevoflurane -medicines for depression, anxiety, or psychotic disturbances -medicines for sleep -muscle relaxants -naltrexone -narcotic medicines (opiates) for pain -phenothiazines like perphenazine, thioridazine, chlorpromazine, mesoridazine, fluphenazine, prochlorperazine, promazine, and trifluoperazine -scopolamine -tramadol -trihexyphenidyl This list may not describe all possible interactions. Give your health care provider a list of all the medicines, herbs, non-prescription drugs, or dietary supplements you use. Also tell them if you smoke, drink alcohol, or use illegal drugs. Some items may interact with your medicine. What should I watch for while using this medicine? Tell your doctor or health care professional if your pain does not go away, if it gets worse, or if you have new or a different type of pain. You may develop tolerance to the medicine. Tolerance means that you will need a higher dose of the medication for pain relief. Tolerance is normal and is expected if you take  this medicine for a long time. Do not suddenly stop taking your medicine because you may develop a severe reaction. Your body becomes used to the medicine. This does NOT mean you are addicted. Addiction is a behavior related to getting and using a drug for a non-medical reason. If you have pain, you have a medical reason to take pain medicine. Your doctor will tell you how much medicine to take. If your doctor wants you to stop the medicine, the dose will be slowly lowered over time to avoid any side effects. You may get drowsy or dizzy. Do not drive, use machinery, or do anything that needs mental alertness until you know how this medicine affects you. Do not stand or sit up quickly, especially if you are an older patient. This reduces the risk of dizzy or fainting spells. Alcohol may interfere with the effect of this medicine. Avoid alcoholic drinks. There are different types of narcotic medicines (opiates) for pain. If you take more than one type at the same time, you may have more side effects. Give your health care provider  a list of all medicines you use. Your doctor will tell you how much medicine to take. Do not take more medicine than directed. Call emergency for help if you have problems breathing. The medicine will cause constipation. Try to have a bowel movement at least every 2 to 3 days. If you do not have a bowel movement for 3 days, call your doctor or health care professional. Do not take Tylenol (acetaminophen) or medicines that have acetaminophen with this medicine. Too much acetaminophen can be very dangerous. Many nonprescription medicines contain acetaminophen. Always read the labels carefully to avoid taking more acetaminophen. What side effects may I notice from receiving this medicine? Side effects that you should report to your doctor or health care professional as soon as possible: -allergic reactions like skin rash, itching or hives, swelling of the face, lips, or  tongue -breathing difficulties, wheezing -confusion -light headedness or fainting spells -severe stomach pain -unusually weak or tired -yellowing of the skin or the whites of the eyes Side effects that usually do not require medical attention (report to your doctor or health care professional if they continue or are bothersome): -dizziness -drowsiness -nausea -vomiting This list may not describe all possible side effects. Call your doctor for medical advice about side effects. You may report side effects to FDA at 1-800-FDA-1088. Where should I keep my medicine? Keep out of the reach of children. This medicine can be abused. Keep your medicine in a safe place to protect it from theft. Do not share this medicine with anyone. Selling or giving away this medicine is dangerous and against the law. This medicine may cause accidental overdose and death if it taken by other adults, children, or pets. Mix any unused medicine with a substance like cat litter or coffee grounds. Then throw the medicine away in a sealed container like a sealed bag or a coffee can with a lid. Do not use the medicine after the expiration date. Store at room temperature between 20 and 25 degrees C (68 and 77 degrees F). NOTE: This sheet is a summary. It may not cover all possible information. If you have questions about this medicine, talk to your doctor, pharmacist, or health care provider.    2016, Elsevier/Gold Standard. (2013-11-29 15:18:46)

## 2015-05-03 NOTE — Discharge Summary (Signed)
Physician Discharge Summary  Patient ID: Lydia Walsh MRN: [id number] DOB/AGE: 1955-03-22 60 y.o.  Admit date: 05/02/2015 Discharge date: 05/03/2015  Admission Diagnoses: Endometrial cancer [facility])  Discharge Diagnoses:  Principal Problem:   Endometrial cancer [facility])   Discharged Condition:  The patient is in good condition and stable for discharge.   Hospital Course: On 05/02/2015, the patient underwent the following: Procedure(s): XI ROBOTIC ASSISTED TOTAL HYSTERECTOMY WITH BILATERAL SALPINGO OOPHORECTOMY AND SENTINEL LYMPH NODE BIOPSY.  The postoperative course was uneventful.  She was discharged to home on postoperative day 1 tolerating a regular diet, minimal pain, passing flatus, voiding.  Consults: None  Significant Diagnostic Studies: None  Treatments: surgery: see above  Discharge Exam: Blood pressure 132/57, pulse 90, temperature 98.4 F (36.9 C), temperature source Oral, resp. rate 16, height 5' (1.524 m), weight 177 lb (80.287 kg), last menstrual period 07/16/2014, SpO2 93 %. General appearance: alert, cooperative and no distress Resp: clear to auscultation bilaterally Cardio: regular rate and rhythm, S1, S2 normal, no murmur, click, rub or gallop GI: soft, non-tender; bowel sounds normal; no masses,  no organomegaly Extremities: extremities normal, atraumatic, no cyanosis or edema Incision/Wound: Lap sites to the abdomen without erythema or drainage  Disposition: 01-Home or Self Care      Discharge Instructions    Call MD for:  difficulty breathing, headache or visual disturbances    Complete by:  As directed      Call MD for:  extreme fatigue    Complete by:  As directed      Call MD for:  hives    Complete by:  As directed      Call MD for:  persistant dizziness or light-headedness    Complete by:  As directed      Call MD for:  persistant nausea and vomiting    Complete by:  As directed      Call MD for:  redness, tenderness, or signs of infection (pain,  swelling, redness, odor or green/yellow discharge around incision site)    Complete by:  As directed      Call MD for:  severe uncontrolled pain    Complete by:  As directed      Call MD for:  temperature >100.4    Complete by:  As directed      Diet - low sodium heart healthy    Complete by:  As directed      Driving Restrictions    Complete by:  As directed   No driving for 1 week.  Do not take narcotics and drive.     Increase activity slowly    Complete by:  As directed      Lifting restrictions    Complete by:  As directed   No lifting greater than 10 lbs.     Sexual Activity Restrictions    Complete by:  As directed   No sexual activity, nothing in the vagina, for 8 weeks.            Medication List    STOP taking these medications        oxyCODONE-acetaminophen 7.5-325 MG tablet  Commonly known as:  PERCOCET  Replaced by:  oxyCODONE-acetaminophen 5-325 MG tablet      TAKE these medications        allopurinol 100 MG tablet  Commonly known as:  ZYLOPRIM  TAKE ONE TABLET BY MOUTH ONCE DAILY     amLODipine 10 MG tablet  Commonly known as:  NORVASC  TAKE ONE TABLET BY MOUTH ONCE DAILY     enoxaparin 40 MG/0.4ML injection  Commonly known as:  LOVENOX  Inject 0.4 mLs (40 mg total) into the skin daily.     ibuprofen 200 MG tablet  Commonly known as:  ADVIL,MOTRIN  Take 200 mg by mouth every 6 (six) hours as needed for mild pain.     levothyroxine 112 MCG tablet  Commonly known as:  SYNTHROID, LEVOTHROID  Take 1 tablet (112 mcg total) by mouth daily.     oxyCODONE-acetaminophen 5-325 MG tablet  Commonly known as:  PERCOCET/ROXICET  Take 1-2 tablets by mouth every 4 (four) hours as needed (moderate to severe pain).     simvastatin 20 MG tablet  Commonly known as:  ZOCOR  TAKE ONE TABLET BY MOUTH ONCE DAILY IN THE EVENING     spironolactone 25 MG tablet  Commonly known as:  ALDACTONE  TAKE ONE TABLET BY MOUTH ONCE DAILY       Follow-up Information     Follow up with Donaciano Eva, MD On 05/20/2015.   Specialty:  Obstetrics and Gynecology   Why:  at 2:15pm at the [facility] information:   [place] [id number] [phone]       Greater than thirty minutes were spend for face to face discharge instructions and discharge orders/summary in EPIC.   Signed: CROSS, MELISSA DEAL 05/03/2015, 9:00 AM

## 2015-05-07 ENCOUNTER — Telehealth: Payer: Self-pay | Admitting: Gynecologic Oncology

## 2015-05-07 NOTE — Telephone Encounter (Signed)
Informed patient of final path results along with Dr. Leone Brand recommendations for vaginal brachytherapy.  All questions answered.  Patient advised that more information will be given at her follow up appt.  Advised to call for any questions or concerns.

## 2015-05-10 ENCOUNTER — Encounter: Payer: Self-pay | Admitting: Internal Medicine

## 2015-05-13 ENCOUNTER — Other Ambulatory Visit: Payer: Self-pay | Admitting: Internal Medicine

## 2015-05-20 ENCOUNTER — Encounter: Payer: Self-pay | Admitting: Gynecologic Oncology

## 2015-05-20 ENCOUNTER — Ambulatory Visit: Payer: BLUE CROSS/BLUE SHIELD | Attending: Gynecologic Oncology | Admitting: Gynecologic Oncology

## 2015-05-20 VITALS — BP 147/66 | HR 80 | Temp 97.9°F | Resp 18 | Ht 60.0 in | Wt 176.1 lb

## 2015-05-20 DIAGNOSIS — E785 Hyperlipidemia, unspecified: Secondary | ICD-10-CM | POA: Insufficient documentation

## 2015-05-20 DIAGNOSIS — Z87891 Personal history of nicotine dependence: Secondary | ICD-10-CM | POA: Insufficient documentation

## 2015-05-20 DIAGNOSIS — Z79899 Other long term (current) drug therapy: Secondary | ICD-10-CM | POA: Diagnosis not present

## 2015-05-20 DIAGNOSIS — Z9889 Other specified postprocedural states: Secondary | ICD-10-CM | POA: Insufficient documentation

## 2015-05-20 DIAGNOSIS — E669 Obesity, unspecified: Secondary | ICD-10-CM

## 2015-05-20 DIAGNOSIS — G4733 Obstructive sleep apnea (adult) (pediatric): Secondary | ICD-10-CM | POA: Diagnosis not present

## 2015-05-20 DIAGNOSIS — Z888 Allergy status to other drugs, medicaments and biological substances status: Secondary | ICD-10-CM | POA: Insufficient documentation

## 2015-05-20 DIAGNOSIS — M1990 Unspecified osteoarthritis, unspecified site: Secondary | ICD-10-CM | POA: Insufficient documentation

## 2015-05-20 DIAGNOSIS — K5909 Other constipation: Secondary | ICD-10-CM | POA: Diagnosis not present

## 2015-05-20 DIAGNOSIS — K219 Gastro-esophageal reflux disease without esophagitis: Secondary | ICD-10-CM | POA: Diagnosis not present

## 2015-05-20 DIAGNOSIS — C541 Malignant neoplasm of endometrium: Secondary | ICD-10-CM

## 2015-05-20 DIAGNOSIS — Z90710 Acquired absence of both cervix and uterus: Secondary | ICD-10-CM

## 2015-05-20 DIAGNOSIS — Z7189 Other specified counseling: Secondary | ICD-10-CM

## 2015-05-20 DIAGNOSIS — E039 Hypothyroidism, unspecified: Secondary | ICD-10-CM | POA: Insufficient documentation

## 2015-05-20 DIAGNOSIS — I10 Essential (primary) hypertension: Secondary | ICD-10-CM | POA: Insufficient documentation

## 2015-05-20 MED ORDER — DOCUSATE SODIUM 100 MG PO CAPS: 100.0000 mg | ORAL_CAPSULE | Freq: Two times a day (BID) | ORAL | Status: DC

## 2015-05-20 NOTE — Progress Notes (Signed)
ENDOMETRIAL CANCER FOLLOW-UP VISIT  Assessment:    60 y.o. 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   S/p robotic assisted total hysterectomy, BSO, sentinel lymph node biopsy on 05/02/15. +'ve LVSI, 90% myometrial invasion,  Negative right sentinel lymph nodes (left nodal specimen empty).   Plan: 1) Pathology reports reviewed today  2) Treatment counseling - high intermediate risk for recurrence with deep myometrial invasion, grade 2 tumor, tumor >7cm, and positive LVSI. Given the lack of nodal specimen from the left pelvis, recommend CT imaging of abdomen and pelvis prior to referral to radiation oncology.  Recommend vaginal brachytherapy (unless there is suspicion for nodal involvement on imaging).   I discussed that this can reduce her risk for recurrence to approximately 5%, however recurrence is still a possibility even after adjuvant therapy.  3)  Obesity - provided with information about healthy eating classes  4) postoperative constipation - prescribed colace  5) follow-up: Dr Skeet Latch or I will see her back 3 months after completing radiation.   HPI:  Lydia Walsh is a 60 y.o. year old G3P3. initially seen in consultation on 03/29/15 referred by Dr Radene Knee for grade 2 endometrial cancer.  She then underwent a robotic assisted total hysterectomy, BSO, sentinel lymph node biopsy with Dr Skeet Latch on [id number] without complications.  Her postoperative course was uncomplicated.  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  She is seen today for a postoperative check and to discuss her pathology results and treatment plan.  Since discharge from the hospital, she is feeling well.  She has improving appetite, normal bowel and bladder function, and pain controlled with  minimal PO medication. She confirmed constipation. She has no other complaints today.  Past Medical History  Diagnosis Date  . Hyperlipidemia   . Hypertension   . Hypothyroidism   . Osteoarthritis   . Endometrial polyp   . History of uterine fibroid   . PMB (postmenopausal bleeding)   . OSA (obstructive sleep apnea)     moderate OSA per study 06-21-2005--  per pt does need cpap anymore never purchased it  . GERD (gastroesophageal reflux disease)   . Wears glasses   . Wears dentures     upper  . Heart murmur   . Cancer [facility])     endometrial cancer   Past Surgical History  Procedure Laterality Date  . Knee arthroscopy w/ meniscectomy Bilateral right 04-12-2006//  left 12-29-2006    and Chondroplasty  . Excision benign right breast mass  age 55  . Dilatation & currettage/hysteroscopy with resectocope  12-13-2002  &  09-26-2004    polyp/  submucosal fibroid  . Transthoracic echocardiogram  04-02-2010    normal LV, ef 65%  . Tubal ligation  1980's  . Dilatation & curettage/hysteroscopy with myosure N/A 03/07/2015    Procedure: DILATATION & CURETTAGE/HYSTEROSCOPY WITH MYOSURE (POLYP);  Surgeon: Arvella Nigh, MD;  Location: [facility];  Service: Gynecology;  Laterality: N/A;  . Robotic assisted total hysterectomy with bilateral salpingo oopherectomy Bilateral 05/02/2015    Procedure: XI ROBOTIC ASSISTED TOTAL HYSTERECTOMY WITH BILATERAL SALPINGO OOPHORECTOMY AND SENTINAL LYMPH NODE BIOPSY;  Surgeon: Janie Morning, MD;  Location: [facility];  Service: Gynecology;  Laterality: Bilateral;   History reviewed. No pertinent family history. Social History   Social History  . Marital  Status: Divorced    Spouse Name: N/A  . Number of Children: N/A  . Years of Education: N/A   Occupational History  . Not on file.   Social History Main Topics  . Smoking status: Former Smoker -- 2 years    Types: Cigarettes    Quit date: 02/28/1994  . Smokeless tobacco: Never Used  .  Alcohol Use: No  . Drug Use: No  . Sexual Activity: Not on file   Other Topics Concern  . Not on file   Social History Narrative   Lives with her son.  Perimenopausal.   Allergies  Allergen Reactions  . Hctz [Hydrochlorothiazide] Rash   Current Outpatient Prescriptions on File Prior to Visit  Medication Sig Dispense Refill  . allopurinol (ZYLOPRIM) 100 MG tablet TAKE ONE TABLET BY MOUTH ONCE DAILY 30 tablet 0  . amLODipine (NORVASC) 10 MG tablet TAKE ONE TABLET BY MOUTH ONCE DAILY 30 tablet 0  . enoxaparin (LOVENOX) 40 MG/0.4ML injection Inject 0.4 mLs (40 mg total) into the skin daily. 13 Syringe 0  . ibuprofen (ADVIL,MOTRIN) 200 MG tablet Take 200 mg by mouth every 6 (six) hours as needed for mild pain.     Marland Kitchen levothyroxine (SYNTHROID, LEVOTHROID) 112 MCG tablet Take 1 tablet (112 mcg total) by mouth daily. (Patient taking differently: Take 112 mcg by mouth daily before breakfast. ) 90 tablet 2  . oxyCODONE-acetaminophen (PERCOCET/ROXICET) 5-325 MG tablet Take 1-2 tablets by mouth every 4 (four) hours as needed (moderate to severe pain). 30 tablet 0  . simvastatin (ZOCOR) 20 MG tablet TAKE ONE TABLET BY MOUTH ONCE DAILY IN THE EVENING 30 tablet 0  . spironolactone (ALDACTONE) 25 MG tablet TAKE ONE TABLET BY MOUTH ONCE DAILY (Patient taking differently: TAKE ONE TABLET BY MOUTH IN THE EVENING) 30 tablet 5  . [DISCONTINUED] esomeprazole (NEXIUM) 20 MG capsule Take 2 capsules (40 mg total) by mouth daily before breakfast. 30 capsule 3   No current facility-administered medications on file prior to visit.     Review of systems: Constitutional:  She has no weight gain or weight loss. She has no fever or chills. Eyes: No blurred vision Ears, Nose, Mouth, Throat: No dizziness, headaches or changes in hearing. No mouth sores. Cardiovascular: No chest pain, palpitations or edema. Respiratory:  No shortness of breath, wheezing or cough Gastrointestinal: She has normal bowel movements  without diarrhea or constipation. She denies any nausea or vomiting. She denies blood in her stool or heart burn. Genitourinary:  She denies pelvic pain, pelvic pressure or changes in her urinary function. She has no hematuria, dysuria, or incontinence. She has no irregular vaginal bleeding or vaginal discharge Musculoskeletal: Denies muscle weakness or joint pains.  Skin:  She has no skin changes, rashes or itching Neurological:  Denies dizziness or headaches. No neuropathy, no numbness or tingling. Psychiatric:  She denies depression or anxiety. Hematologic/Lymphatic:   No easy bruising or bleeding   Physical Exam: Blood pressure 147/66, pulse 80, temperature 97.9 F (36.6 C), temperature source Oral, resp. rate 18, height 5' (1.524 m), weight 176 lb 1.6 oz (79.878 kg), last menstrual period 07/16/2014, SpO2 100 %. General: Well dressed, well nourished in no apparent distress.   HEENT:  Normocephalic and atraumatic, no lesions.  Extraocular muscles intact. Sclerae anicteric. Pupils equal, round, reactive. No mouth sores or ulcers. Thyroid is normal size, not nodular, midline. Lungs:  Clear to auscultation bilaterally.  No wheezes. Cardiovascular:  Regular rate and rhythm.  No murmurs or rubs.  Abdomen:  Soft, nontender, nondistended.  No palpable masses.  No hepatosplenomegaly.  No ascites. Normal bowel sounds.  No hernias.  Incisions are well healed Genitourinary: Normal EGBUS  Vaginal cuff intact.  No bleeding or discharge.  No cul de sac fullness. Extremities: No cyanosis, clubbing or edema.  No calf tenderness or erythema. No palpable cords. Psychiatric: Mood and affect are appropriate. Neurological: Awake, alert and oriented x 3. Sensation is intact, no neuropathy.  Musculoskeletal: No pain, normal strength and range of motion.   30 minutes of direct face to face counseling time was spent with the patient. This included discussion about prognosis, therapy recommendations and  postoperative side effects and are beyond the scope of routine postoperative care.  Donaciano Eva, MD

## 2015-05-20 NOTE — Patient Instructions (Signed)
Plan to meet with Dr. Sondra Come on Jun 06, 2015 at 12:30pm to discuss radiation.  Please call for any questions or concerns.

## 2015-05-24 ENCOUNTER — Ambulatory Visit (HOSPITAL_COMMUNITY)
Admission: RE | Admit: 2015-05-24 | Discharge: 2015-05-24 | Disposition: A | Discharge status: Home / Self Care | Payer: BLUE CROSS/BLUE SHIELD | Source: Ambulatory Visit | Attending: Gynecologic Oncology | Admitting: Gynecologic Oncology

## 2015-05-24 ENCOUNTER — Encounter (HOSPITAL_COMMUNITY): Payer: Self-pay

## 2015-05-24 DIAGNOSIS — R590 Localized enlarged lymph nodes: Secondary | ICD-10-CM | POA: Diagnosis not present

## 2015-05-24 DIAGNOSIS — N2889 Other specified disorders of kidney and ureter: Secondary | ICD-10-CM | POA: Diagnosis not present

## 2015-05-24 DIAGNOSIS — K5730 Diverticulosis of large intestine without perforation or abscess without bleeding: Secondary | ICD-10-CM | POA: Insufficient documentation

## 2015-05-24 DIAGNOSIS — C541 Malignant neoplasm of endometrium: Secondary | ICD-10-CM | POA: Diagnosis not present

## 2015-05-24 DIAGNOSIS — I2510 Atherosclerotic heart disease of native coronary artery without angina pectoris: Secondary | ICD-10-CM | POA: Insufficient documentation

## 2015-05-24 DIAGNOSIS — K8020 Calculus of gallbladder without cholecystitis without obstruction: Secondary | ICD-10-CM | POA: Insufficient documentation

## 2015-05-24 DIAGNOSIS — R591 Generalized enlarged lymph nodes: Secondary | ICD-10-CM | POA: Diagnosis not present

## 2015-05-24 DIAGNOSIS — N200 Calculus of kidney: Secondary | ICD-10-CM | POA: Diagnosis not present

## 2015-05-24 MED ORDER — IOPAMIDOL (ISOVUE-300) INJECTION 61%
100.0000 mL | Freq: Once | INTRAVENOUS | Status: AC | PRN
  Administered 2015-05-24: 100 mL via INTRAVENOUS

## 2015-05-27 NOTE — Progress Notes (Addendum)
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 Lymph node, sentinel, biopsy, right external iliac vein sentinel - TWO BENIGN LYMPH NODES (0/2). 2. Lymph node, sentinel, biopsy, left external iliac sentinel - BENIGN ADIPOSE TISSUE. - NO LYMPH NODE TISSUE OR MALIGNANCY. 3. Uterus +/- tubes/ovaries, neoplastic, cervix - ENDOMETRIAL ADENOCARCINOMA EXTENDING INTO THE DEEP MYOMETRIUM. - NO CERVICAL, BILATERAL FALLOPIAN TUBES OR BILATERAL OVARY INVOLVEMENT. - MARGINS NOT INVOLVED.  03/07/15 Diagnosis 1. Endometrium, resection - ENDOMETRIAL ADENOCARCINOMA. 2. Endometrium, curettage - SCANT ENDOMETRIAL STROMA AND BENIGN GLANDS. - NO HYPERPLASIA OR MALIGNANCY.  Past/Anticipated interventions by Gyn/Onc surgery, if any: 05/02/15 - Procedure: XI ROBOTIC ASSISTED TOTAL HYSTERECTOMY WITH BILATERAL SALPINGO OOPHORECTOMY AND SENTINAL LYMPH NODE BIOPSY;  Surgeon: Janie Morning, MD;  Location: [facility];  Service: Gynecology;  Laterality: Bilateral;  Past/Anticipated interventions by medical oncology, if any: no   Weight changes, if any: no  Bowel/Bladder complaints, if [id number] having any bowel or bladder issues  Nausea/Vomiting, if any: no  Pain issues, if any:  no  SAFETY ISSUES:  Prior radiation? no  Pacemaker/ICD? no  Possible current pregnancy? no  Is the patient on methotrexate? no  Current Complaints / other details:  Dr. Denman George is recommending vaginal brachytherapy (unless there is suspicion for nodal involvement on imaging). She is going to have a PET scan on 5/31.  Patient reports having more hot flashes since having surgery.  BP 157/75 mmHg  Pulse 92  Temp(Src) 97.8 F (36.6 C) (Oral)  Resp 20  Ht 4\' 11"  (1.499 m)  Wt 179 lb 8 oz (81.421 kg)  BMI 36.24 kg/m2  SpO2 100%  LMP 07/16/2014   Wt  Readings from Last 3 Encounters:  06/06/15 179 lb 8 oz (81.421 kg)  05/20/15 176 lb 1.6 oz (79.878 kg)  05/02/15 177 lb (80.287 kg)

## 2015-06-03 ENCOUNTER — Other Ambulatory Visit: Payer: Self-pay | Admitting: Gynecologic Oncology

## 2015-06-03 ENCOUNTER — Telehealth: Payer: Self-pay | Admitting: Gynecologic Oncology

## 2015-06-03 ENCOUNTER — Encounter: Payer: Self-pay | Admitting: Gynecologic Oncology

## 2015-06-03 ENCOUNTER — Telehealth: Payer: Self-pay | Admitting: *Deleted

## 2015-06-03 ENCOUNTER — Other Ambulatory Visit (HOSPITAL_COMMUNITY)
Admission: RE | Admit: 2015-06-03 | Discharge: 2015-06-03 | Disposition: A | Discharge status: Home / Self Care | Payer: BLUE CROSS/BLUE SHIELD | Source: Ambulatory Visit | Attending: Gynecologic Oncology | Admitting: Gynecologic Oncology

## 2015-06-03 DIAGNOSIS — C541 Malignant neoplasm of endometrium: Secondary | ICD-10-CM | POA: Diagnosis not present

## 2015-06-03 NOTE — Progress Notes (Signed)
Gynecologic Oncology Multi-Disciplinary Disposition Conference Note  Date of the Conference: Jun 03, 2015  Patient Name: Lydia Walsh  Referring Provider: Dr. Radene Knee Primary GYN Oncologist: Dr. Everitt Amber, Dr. Janie Morning  Stage/Disposition:  Stage IB grade 2 endometrioid endometrial cancer with suspicious lymph nodes on CT imaging.  Disposition is to PET scan versus vaginal brachytherapy.   This Multidisciplinary conference took place involving physicians from [place], [place], Radiation Oncology, Pathology, Radiology along with the Gynecologic Oncology Nurse Practitioner and RN.  Comprehensive assessment of the patient's malignancy, staging, need for surgery, chemotherapy, radiation therapy, and need for further testing were reviewed. Supportive measures, both inpatient and following discharge were also discussed. The recommended plan of care is documented. Greater than 35 minutes were spent correlating and coordinating this patient's care.

## 2015-06-03 NOTE — Telephone Encounter (Signed)
Pt called and notified of future PET scan appointment. Pt is scheduled for a Pet scan on 06/12/2015 at [facility] at 9:00am. Pt was instructed to arrive at 8:30am and have nothing to eat or drink after midnight . Pt agreed with time,date and instructions.  Maudie Mercury

## 2015-06-03 NOTE — Telephone Encounter (Signed)
Discussed CT findings of enlarged lymph nodes concerning for metastatic disease. Discussed options including proceeding directly with carbo/taxol x 6 cycles + pelvic/PA and vaginal RT. Vs  PET scan and chemo + RT if PET avid nodes vs vaginal brachy only (with repeat PET in 3 months) if PET non-avid nodes.  Patient is electing for proceeding with  PET scan now. If PET avid nodes - will treat with carbo + taxol + 6 cycles with pelvic/PA and vaginal RT. If PET non-avid nodes - will treat with vaginal brachy alone and repeat PET in 3 months to determine if growth in nodes at PA and left pelvic region, in which case would proceed with systemic therapy.  Lydia Eva, MD

## 2015-06-06 ENCOUNTER — Ambulatory Visit
Admission: RE | Admit: 2015-06-06 | Discharge: 2015-06-06 | Disposition: A | Discharge status: Home / Self Care | Payer: BLUE CROSS/BLUE SHIELD | Source: Ambulatory Visit | Attending: Radiation Oncology | Admitting: Radiation Oncology

## 2015-06-06 ENCOUNTER — Encounter: Payer: Self-pay | Admitting: Radiation Oncology

## 2015-06-06 VITALS — BP 157/75 | HR 92 | Temp 97.8°F | Resp 20 | Ht 59.0 in | Wt 179.5 lb

## 2015-06-06 DIAGNOSIS — K219 Gastro-esophageal reflux disease without esophagitis: Secondary | ICD-10-CM | POA: Diagnosis not present

## 2015-06-06 DIAGNOSIS — E785 Hyperlipidemia, unspecified: Secondary | ICD-10-CM | POA: Insufficient documentation

## 2015-06-06 DIAGNOSIS — G4733 Obstructive sleep apnea (adult) (pediatric): Secondary | ICD-10-CM | POA: Diagnosis not present

## 2015-06-06 DIAGNOSIS — R011 Cardiac murmur, unspecified: Secondary | ICD-10-CM | POA: Insufficient documentation

## 2015-06-06 DIAGNOSIS — M1990 Unspecified osteoarthritis, unspecified site: Secondary | ICD-10-CM | POA: Insufficient documentation

## 2015-06-06 DIAGNOSIS — C541 Malignant neoplasm of endometrium: Secondary | ICD-10-CM | POA: Insufficient documentation

## 2015-06-06 DIAGNOSIS — E039 Hypothyroidism, unspecified: Secondary | ICD-10-CM | POA: Insufficient documentation

## 2015-06-06 DIAGNOSIS — I10 Essential (primary) hypertension: Secondary | ICD-10-CM | POA: Insufficient documentation

## 2015-06-06 NOTE — Progress Notes (Signed)
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 female who initially presented with postmenopausal bleeding for approximately 6 months.  The patient's gynecologist, Lydia Walsh, proceeded with a biopsy of an endometrial polyp associated with her postmenopausal bleeding. Pathology confirmed a grade 2 endometrial carcinoma. She was seen by Lydia. Denman Walsh for consultation on 03/29/15.  She then underwent a robotic assisted total hysterectomy, BSO, sentinel lymph node biopsy with Lydia Lydia Walsh on [id number] without complications.  Her postoperative course was uncomplicated.  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  Abdominal and pelvic CT scan on 05/24/15 revealed evidence for enlarged lymph nodes concerning for metastatic disease. Lydia. Denman Walsh has spoke with the patient about these results. The patient is currently electing to proceed with PET scan. Lydia.Rossi has advised for chemotherapy and vaginal brachytherapy/ External beam-IMRT if nodes are positive;  vaginal brachytherapy alone if nodes are negative on PET scan.  Her PT scan is scheduled for 06/12/15.    PREVIOUS RADIATION THERAPY: No  PAST MEDICAL HISTORY:  has a past medical history of Hyperlipidemia; Hypertension; Hypothyroidism; Osteoarthritis; Endometrial polyp; History of uterine fibroid; PMB (postmenopausal bleeding); OSA (obstructive sleep apnea); GERD (gastroesophageal reflux disease); Wears glasses; Wears  dentures; Heart murmur; and Cancer ([place]).    PAST SURGICAL HISTORY: Past Surgical History  Procedure Laterality Date  . Walsh arthroscopy w/ meniscectomy Bilateral right 04-12-2006//  left 12-29-2006    and Chondroplasty  . Excision benign right breast mass  age 60  . Dilatation & currettage/hysteroscopy with resectocope  12-13-2002  &  09-26-2004    polyp/  submucosal fibroid  . Transthoracic echocardiogram  04-02-2010    normal LV, ef 65%  . Tubal ligation  1980's  . Dilatation & curettage/hysteroscopy with myosure N/A 03/07/2015    Procedure: DILATATION & CURETTAGE/HYSTEROSCOPY WITH MYOSURE (POLYP);  Surgeon: Lydia Nigh, MD;  [facility]: [facility];  Service: Gynecology;  Laterality: N/A;  . Robotic assisted total hysterectomy with bilateral salpingo oopherectomy Bilateral 05/02/2015    Procedure: XI ROBOTIC ASSISTED TOTAL HYSTERECTOMY WITH BILATERAL SALPINGO OOPHORECTOMY AND SENTINAL LYMPH NODE BIOPSY;  Surgeon: Lydia Morning, MD;  Location: [facility];  Service: Gynecology;  Laterality: Bilateral;    FAMILY HISTORY: family history includes Hypertension in her mother.  SOCIAL HISTORY:  reports that she quit smoking about 21 years ago. Her smoking use included Cigarettes. She quit after 2 years of use. She has never used smokeless tobacco. She reports that she does not drink alcohol or use illicit drugs.  ALLERGIES: Hctz  MEDICATIONS:  Current Outpatient Prescriptions  Medication Sig Dispense Refill  . allopurinol (ZYLOPRIM) 100 MG tablet TAKE ONE TABLET BY MOUTH ONCE DAILY 30 tablet 0  . amLODipine (NORVASC) 10 MG tablet TAKE ONE TABLET BY MOUTH ONCE DAILY 30 tablet 0  . ibuprofen (ADVIL,MOTRIN) 200 MG tablet Take 200 mg by mouth every 6 (six) hours as needed for mild pain.     Lydia Walsh levothyroxine (SYNTHROID, LEVOTHROID) 112 MCG tablet Take 1 tablet (112 mcg total) by  mouth daily. (Patient taking differently: Take 112 mcg by mouth daily before breakfast. ) 90 tablet 2  .  simvastatin (ZOCOR) 20 MG tablet TAKE ONE TABLET BY MOUTH ONCE DAILY IN THE EVENING 30 tablet 0  . spironolactone (ALDACTONE) 25 MG tablet TAKE ONE TABLET BY MOUTH ONCE DAILY (Patient taking differently: TAKE ONE TABLET BY MOUTH IN THE EVENING) 30 tablet 5  . docusate sodium (COLACE) 100 MG capsule Take 1 capsule (100 mg total) by mouth 2 (two) times daily. (Patient not taking: Reported on 06/06/2015) 30 capsule 0  . oxyCODONE-acetaminophen (PERCOCET/ROXICET) 5-325 MG tablet Take 1-2 tablets by mouth every 4 (four) hours as needed (moderate to severe pain). (Patient not taking: Reported on 06/06/2015) 30 tablet 0  . [DISCONTINUED] esomeprazole (NEXIUM) 20 MG capsule Take 2 capsules (40 mg total) by mouth daily before breakfast. 30 capsule 3   No current facility-administered medications for this encounter.    REVIEW OF SYSTEMS:  A 15 point review of systems is documented in the electronic medical record. This was obtained by the nursing staff. However, I reviewed this with the patient to discuss relevant findings and make appropriate changes.  Pertinent items are noted in HPI.   PHYSICAL EXAM:  height is 4\' 11"  (1.499 m) and weight is 179 lb 8 oz (81.421 kg). Her oral temperature is 97.8 F (36.6 C). Her blood pressure is 157/75 and her pulse is 92. Her respiration is 20 and oxygen saturation is 100%.   General: Alert and oriented, in no acute distress HEENT: Head is normocephalic. Extraocular movements are intact. Oropharynx is clear. Neck: Neck is supple, no palpable cervical or supraclavicular lymphadenopathy. Heart: Regular in rate and rhythm with no murmurs, rubs, or gallops. Chest: Clear to auscultation bilaterally, with no rhonchi, wheezes, or rales. Abdomen: Soft, nontender, nondistended, with no rigidity or guarding. Laparoscopic scars healing well without signs of drainage or infection Extremities: No cyanosis or edema. Lymphatics: see Neck Exam Skin: No concerning  lesions. Musculoskeletal: symmetric strength and muscle tone throughout. Neurologic: Cranial nerves II through XII are grossly intact. No obvious focalities. Speech is fluent. Coordination is intact. Psychiatric: Judgment and insight are intact. Affect is appropriate. Pelvic exam deferred in light of recent surgery     ECOG = 1 LABORATORY DATA:  Lab Results  Component Value Date   WBC 11.4* 05/03/2015   HGB 11.5* 05/03/2015   HCT 34.7* 05/03/2015   MCV 84.0 05/03/2015   PLT 259 05/03/2015   NEUTROABS 4.6 04/25/2015   Lab Results  Component Value Date   NA 140 05/03/2015   K 3.9 05/03/2015   CL 105 05/03/2015   CO2 24 05/03/2015   GLUCOSE 139* 05/03/2015   CREATININE 0.67 05/03/2015   CALCIUM 9.4 05/03/2015      RADIOGRAPHY: Ct Abdomen Pelvis W Contrast  05/24/2015  CLINICAL DATA:  Deeply invasive endometrial cancer diagnosed March 2017 status post hysterectomy and bilateral salpingo oophorectomy 05/02/2015. Treatment planning. EXAM: CT ABDOMEN AND PELVIS WITH CONTRAST TECHNIQUE: Multidetector CT imaging of the abdomen and pelvis was performed using the standard protocol following bolus administration of intravenous contrast. CONTRAST:  144mL ISOVUE-300 IOPAMIDOL (ISOVUE-300) INJECTION 61% COMPARISON:  03/31/2014 abdominal sonogram. No prior CT abdomen/ pelvis. FINDINGS: Lower chest: Calcified 2 mm granuloma in the anterior right lower lobe. Calcified 2 mm granuloma in the lingula. Left anterior descending coronary atherosclerosis. Hepatobiliary: Normal liver with no liver mass. Gallbladder is filled with faintly peripherally calcified gallstones measuring up to the 1 cm in size. No  gallbladder wall thickening or pericholecystic fluid. No biliary ductal dilatation. Pancreas: Normal, with no mass or duct dilation. Spleen: Normal size. No mass. Adrenals/Urinary Tract: Normal adrenals. Nonobstructing 4 mm stone in the lower right kidney. No hydronephrosis. There is a 1.6 x 1.6 cm renal  mass in the posterior lower left kidney with density 59 HU (series 2/image 38), indeterminate. Additional subcentimeter hypodense renal cortical lesion in the anterior interpolar left kidney is too small to characterize. Normal bladder. Stomach/Bowel: Grossly normal stomach. Normal caliber small bowel with no small bowel wall thickening. Normal appendix. Moderate sigmoid diverticulosis, with no large bowel wall thickening or pericolonic fat stranding. Oral contrast reaches the rectum. Vascular/Lymphatic: Atherosclerotic nonaneurysmal abdominal aorta. Patent portal, splenic, hepatic and renal veins. There is mild left common iliac lymphadenopathy, largest 1.1 cm (series 2/ image 53). There is a borderline prominent rounded 0.7 cm low aortocaval node (series 2/ image 45). No additional pathologically enlarged abdominopelvic lymph nodes. Reproductive: Status post hysterectomy, with no mass or fluid collection at the vaginal cuff. No adnexal mass. Other: No pneumoperitoneum, ascites or focal fluid collection. Musculoskeletal: No aggressive appearing focal osseous lesions. Moderate degenerative changes in the visualized thoracolumbar spine. IMPRESSION: 1. Mild left common iliac lymphadenopathy. Borderline prominent rounded low aortocaval node. These nodes are suspicious for metastatic disease. 2. No residual or recurrent tumor at the vaginal cuff. No adnexal mass. No visceral metastases. 3. Indeterminate dense 1.6 cm renal mass in the lower left kidney, for which renal cell carcinoma is to be excluded. Renal mass protocol MRI abdomen without and with IV contrast is recommended for further evaluation. If MRI is not clinically feasible, a renal mass protocol CT abdomen without and with IV contrast is recommended. 4. Additional findings include coronary atherosclerosis, cholelithiasis, nonobstructing right renal stone and moderate sigmoid diverticulosis. Electronically Signed   By: Ilona Sorrel M.D.   On: 05/24/2015 10:10       IMPRESSION: The patient is at high intermediate risk for recurrence with deep myometrial invasion, grade 2 tumor, tumor >7cm, and positive LVSI. She is at risk for vaginal cuff recurrence and would likely benefit from postoperative vaginal brachytherapy. She may also potentially benefit for more extensive treatment depending on results of her PET scan. Her PET scan is scheduled for 06/12/15. I will determine an appropriate course of treatment once the results of her PET scan are available.  PLAN: Proceed with PET scan on 06/12/15. Follow up to discuss results and  course of treatment on 06/13/2015.      ------------------------------------------------  Blair Promise, PhD, MD  This document serves as a record of services personally performed by Gery Pray, MD. It was created on his behalf by Derek Mound, a trained medical scribe. The creation of this record is based on the scribe's personal observations and the provider's statements to them. This document has been checked and approved by the attending provider.

## 2015-06-12 ENCOUNTER — Ambulatory Visit (HOSPITAL_COMMUNITY)
Admission: RE | Admit: 2015-06-12 | Discharge: 2015-06-12 | Disposition: A | Discharge status: Home / Self Care | Payer: BLUE CROSS/BLUE SHIELD | Source: Ambulatory Visit | Attending: Gynecologic Oncology | Admitting: Gynecologic Oncology

## 2015-06-12 DIAGNOSIS — N289 Disorder of kidney and ureter, unspecified: Secondary | ICD-10-CM | POA: Insufficient documentation

## 2015-06-12 DIAGNOSIS — R935 Abnormal findings on diagnostic imaging of other abdominal regions, including retroperitoneum: Secondary | ICD-10-CM | POA: Insufficient documentation

## 2015-06-12 DIAGNOSIS — R599 Enlarged lymph nodes, unspecified: Secondary | ICD-10-CM | POA: Insufficient documentation

## 2015-06-12 DIAGNOSIS — I2510 Atherosclerotic heart disease of native coronary artery without angina pectoris: Secondary | ICD-10-CM | POA: Diagnosis not present

## 2015-06-12 DIAGNOSIS — K8020 Calculus of gallbladder without cholecystitis without obstruction: Secondary | ICD-10-CM | POA: Insufficient documentation

## 2015-06-12 DIAGNOSIS — C541 Malignant neoplasm of endometrium: Secondary | ICD-10-CM

## 2015-06-12 LAB — GLUCOSE, CAPILLARY: Glucose-Capillary: 116 mg/dL — ABNORMAL HIGH (ref 65–99)

## 2015-06-12 MED ORDER — FLUDEOXYGLUCOSE F - 18 (FDG) INJECTION
8.7000 | Freq: Once | INTRAVENOUS | Status: AC | PRN
  Administered 2015-06-12: 8.7 via INTRAVENOUS

## 2015-06-13 ENCOUNTER — Encounter: Payer: Self-pay | Admitting: Radiation Oncology

## 2015-06-13 ENCOUNTER — Telehealth: Payer: Self-pay

## 2015-06-13 ENCOUNTER — Ambulatory Visit
Admission: RE | Admit: 2015-06-13 | Discharge: 2015-06-13 | Disposition: A | Discharge status: Home / Self Care | Payer: BLUE CROSS/BLUE SHIELD | Source: Ambulatory Visit | Attending: Radiation Oncology | Admitting: Radiation Oncology

## 2015-06-13 ENCOUNTER — Ambulatory Visit: Payer: BLUE CROSS/BLUE SHIELD

## 2015-06-13 VITALS — BP 167/74 | HR 84 | Temp 98.1°F | Ht 59.0 in | Wt 178.3 lb

## 2015-06-13 DIAGNOSIS — I10 Essential (primary) hypertension: Secondary | ICD-10-CM | POA: Diagnosis not present

## 2015-06-13 DIAGNOSIS — K219 Gastro-esophageal reflux disease without esophagitis: Secondary | ICD-10-CM | POA: Diagnosis not present

## 2015-06-13 DIAGNOSIS — G4733 Obstructive sleep apnea (adult) (pediatric): Secondary | ICD-10-CM | POA: Diagnosis not present

## 2015-06-13 DIAGNOSIS — E785 Hyperlipidemia, unspecified: Secondary | ICD-10-CM | POA: Diagnosis not present

## 2015-06-13 DIAGNOSIS — C541 Malignant neoplasm of endometrium: Secondary | ICD-10-CM

## 2015-06-13 DIAGNOSIS — R011 Cardiac murmur, unspecified: Secondary | ICD-10-CM | POA: Diagnosis not present

## 2015-06-13 DIAGNOSIS — M1990 Unspecified osteoarthritis, unspecified site: Secondary | ICD-10-CM | POA: Diagnosis not present

## 2015-06-13 DIAGNOSIS — E039 Hypothyroidism, unspecified: Secondary | ICD-10-CM | POA: Diagnosis not present

## 2015-06-13 NOTE — Progress Notes (Signed)
Radiation Oncology         [phone] ________________________________  Name: Lydia Walsh MRN: [id number]  Date: 06/13/2015  DOB: Mar 01, 1955  Re-Evaluation Visit Note  CC: Dellia Nims, MD  Everitt Amber, MD    ICD-9-CM ICD-10-CM   1. Endometrial cancer (HCC) 182.0 C54.1     Diagnosis: Stage IIIC-1 Grade 2 endometrioid endometrial cancer  Interval Since Last Radiation: N/A  Narrative:  The patient returns today to discuss her recent PET scan and a course of treatment. The patient had a PET scan on 06/12/15. This revealed a 1 cm hypermetabolic left common iliac lymph node that is worrisome for metastatic disease. Retroperitoneal lymph nodes noted on CT scan on 05/24/15 were too small for the PET resolution. On the left kidney, a mildly hyperdense lesion was noted and this was also seen on the CT scan from 05/24/15. Due to the 1 cm hypermetabolic iliac lymph node on the PET scan, her staging has been changed from Stage IB to Stage IIIC-1.  ROS: She denies bladder issues. She reports having pain in her rectal area with bowel movements and that started after surgery. She reports having more hot flashes since surgery. She denies having vaginal/rectal bleeding or discharge. She reports having a good appetite and energy level.  ALLERGIES:  is allergic to hctz.  Meds: Current Outpatient Prescriptions  Medication Sig Dispense Refill  . allopurinol (ZYLOPRIM) 100 MG tablet TAKE ONE TABLET BY MOUTH ONCE DAILY 30 tablet 0  . amLODipine (NORVASC) 10 MG tablet TAKE ONE TABLET BY MOUTH ONCE DAILY 30 tablet 0  . ibuprofen (ADVIL,MOTRIN) 200 MG tablet Take 200 mg by mouth every 6 (six) hours as needed for mild pain.     Marland Kitchen levothyroxine (SYNTHROID, LEVOTHROID) 112 MCG tablet Take 1 tablet (112 mcg total) by mouth daily. (Patient taking differently: Take 112 mcg by mouth daily before breakfast. ) 90 tablet 2  . simvastatin (ZOCOR) 20 MG tablet TAKE ONE TABLET BY MOUTH ONCE DAILY IN THE EVENING 30  tablet 0  . spironolactone (ALDACTONE) 25 MG tablet TAKE ONE TABLET BY MOUTH ONCE DAILY (Patient taking differently: TAKE ONE TABLET BY MOUTH IN THE EVENING) 30 tablet 5  . docusate sodium (COLACE) 100 MG capsule Take 1 capsule (100 mg total) by mouth 2 (two) times daily. (Patient not taking: Reported on 06/06/2015) 30 capsule 0  . oxyCODONE-acetaminophen (PERCOCET/ROXICET) 5-325 MG tablet Take 1-2 tablets by mouth every 4 (four) hours as needed (moderate to severe pain). (Patient not taking: Reported on 06/06/2015) 30 tablet 0  . [DISCONTINUED] esomeprazole (NEXIUM) 20 MG capsule Take 2 capsules (40 mg total) by mouth daily before breakfast. 30 capsule 3   No current facility-administered medications for this encounter.    Physical Findings: The patient is in no acute distress. Patient is alert and oriented.  height is 4\' 11"  (1.499 m) and weight is 178 lb 4.8 oz (80.876 kg). Her oral temperature is 98.1 F (36.7 C). Her blood pressure is 167/74 and her pulse is 84. Her oxygen saturation is 100%.   A pelvic exam was deferred today.  Lab Findings: Lab Results  Component Value Date   WBC 11.4* 05/03/2015   HGB 11.5* 05/03/2015   HCT 34.7* 05/03/2015   MCV 84.0 05/03/2015   PLT 259 05/03/2015    Radiographic Findings: Ct Abdomen Pelvis W Contrast  05/24/2015  CLINICAL DATA:  Deeply invasive endometrial cancer diagnosed March 2017 status post hysterectomy and bilateral salpingo oophorectomy 05/02/2015. Treatment planning. EXAM: CT  ABDOMEN AND PELVIS WITH CONTRAST TECHNIQUE: Multidetector CT imaging of the abdomen and pelvis was performed using the standard protocol following bolus administration of intravenous contrast. CONTRAST:  124mL ISOVUE-300 IOPAMIDOL (ISOVUE-300) INJECTION 61% COMPARISON:  03/31/2014 abdominal sonogram. No prior CT abdomen/ pelvis. FINDINGS: Lower chest: Calcified 2 mm granuloma in the anterior right lower lobe. Calcified 2 mm granuloma in the lingula. Left anterior  descending coronary atherosclerosis. Hepatobiliary: Normal liver with no liver mass. Gallbladder is filled with faintly peripherally calcified gallstones measuring up to the 1 cm in size. No gallbladder wall thickening or pericholecystic fluid. No biliary ductal dilatation. Pancreas: Normal, with no mass or duct dilation. Spleen: Normal size. No mass. Adrenals/Urinary Tract: Normal adrenals. Nonobstructing 4 mm stone in the lower right kidney. No hydronephrosis. There is a 1.6 x 1.6 cm renal mass in the posterior lower left kidney with density 59 HU (series 2/image 38), indeterminate. Additional subcentimeter hypodense renal cortical lesion in the anterior interpolar left kidney is too small to characterize. Normal bladder. Stomach/Bowel: Grossly normal stomach. Normal caliber small bowel with no small bowel wall thickening. Normal appendix. Moderate sigmoid diverticulosis, with no large bowel wall thickening or pericolonic fat stranding. Oral contrast reaches the rectum. Vascular/Lymphatic: Atherosclerotic nonaneurysmal abdominal aorta. Patent portal, splenic, hepatic and renal veins. There is mild left common iliac lymphadenopathy, largest 1.1 cm (series 2/ image 53). There is a borderline prominent rounded 0.7 cm low aortocaval node (series 2/ image 45). No additional pathologically enlarged abdominopelvic lymph nodes. Reproductive: Status post hysterectomy, with no mass or fluid collection at the vaginal cuff. No adnexal mass. Other: No pneumoperitoneum, ascites or focal fluid collection. Musculoskeletal: No aggressive appearing focal osseous lesions. Moderate degenerative changes in the visualized thoracolumbar spine. IMPRESSION: 1. Mild left common iliac lymphadenopathy. Borderline prominent rounded low aortocaval node. These nodes are suspicious for metastatic disease. 2. No residual or recurrent tumor at the vaginal cuff. No adnexal mass. No visceral metastases. 3. Indeterminate dense 1.6 cm renal mass in  the lower left kidney, for which renal cell carcinoma is to be excluded. Renal mass protocol MRI abdomen without and with IV contrast is recommended for further evaluation. If MRI is not clinically feasible, a renal mass protocol CT abdomen without and with IV contrast is recommended. 4. Additional findings include coronary atherosclerosis, cholelithiasis, nonobstructing right renal stone and moderate sigmoid diverticulosis. Electronically Signed   By: Ilona Sorrel M.D.   On: 05/24/2015 10:10   Nm Pet Image Initial (pi) Skull Base To Thigh  06/12/2015  CLINICAL DATA:  Initial treatment strategy for endometrial cancer. EXAM: NUCLEAR MEDICINE PET SKULL BASE TO THIGH TECHNIQUE: 8.7 mCi F-18 FDG was injected intravenously. Full-ring PET imaging was performed from the skull base to thigh after the radiotracer. CT data was obtained and used for attenuation correction and anatomic localization. FASTING BLOOD GLUCOSE:  Value: 116 mg/dl COMPARISON:  CT abdomen pelvis 05/24/2015. FINDINGS: NECK No hypermetabolic lymph nodes in the neck. CT images show no acute findings. CHEST No hypermetabolic mediastinal, hilar or axillary lymph nodes. No hypermetabolic pulmonary nodules. Heart is at the upper limits of normal in size to mildly enlarged. Coronary artery calcification. No pericardial or pleural effusion. ABDOMEN/PELVIS 10 mm left common iliac lymph node (CT image 122) has an SUV max of 4.1. Other retroperitoneal lymph nodes described on 05/24/2015 are too small for PET resolution. Focal hypermetabolism associated with a fatty defect in the lower left lateral abdominal wall musculature (CT image 114) is nonspecific and may be postprocedural/postsurgical in etiology.  No abnormal hypermetabolism in the liver, adrenal glands, spleen or pancreas. Liver is grossly unremarkable. Numerous stones are seen in the gallbladder. Adrenal glands and right kidney are unremarkable. Ill-defined and minimally hypodense lesion in the lower  pole left kidney, seen and described on 05/24/2015, appears to be slightly hyperdense on the current study (series 4, image 103), measuring approximately 1.8 cm. Spleen, pancreas, stomach and bowel are grossly unremarkable. Hysterectomy. No free fluid. SKELETON No abnormal osseous hypermetabolism. Degenerative changes are seen in the spine. IMPRESSION: 1. 10 mm left common iliac lymph node is mildly hypermetabolic, worrisome for metastatic disease. Additional sub cm lymph nodes seen described on 05/24/2015 may be too small for PET resolution. 2. Focal hypermetabolism along the lower left lateral abdominal musculature may be postoperative in etiology. 3. Mildly hyperdense lesion in the lower pole left kidney, also seen and described on 05/24/2015. Please refer to that report for further details and recommendations. 4. Coronary artery calcification. 5. Cholelithiasis. Electronically Signed   By: Lorin Picket M.D.   On: 06/12/2015 11:25   Impression: Stage IIIC-1 Grade 2 endometrioid endometrial cancer. We discussed the PET scan findings and that she has a more advanced stage than initially thought. We discused whether we recommend a combination of chemotherapy and radiation in the adjuvant setting. The radiation would include external beam and intracavitary brachytherapy treatments. Dr. Skeet Latch would determine the sequencing of her adjuvant treatment. The patient will be seen by Dr. Marko Plume in the near future.  Plan: The treatment plan is pending  Dr. Leone Brand input.  ____________________________________ -----------------------------------  Blair Promise, PhD, MD  This document serves as a record of services personally performed by Gery Pray, MD. It was created on his behalf by Darcus Austin, a trained medical scribe. The creation of this record is based on the scribe's personal observations and the provider's statements to them. This document has been checked and approved by the attending  provider.

## 2015-06-13 NOTE — Progress Notes (Signed)
Lennart Pall here for follow up.  She denies having pain.  She denies having any bladder issues.  She reports having pain in her rectal area with bowel movements that started after surgery.  She reports having more hot flashes since surgery.  She denies having vaginal/rectal bleeding or discharge.  She reports having a good appetite and energy level.  BP 167/74 mmHg  Pulse 84  Temp(Src) 98.1 F (36.7 C) (Oral)  Ht 4\' 11"  (1.499 m)  Wt 178 lb 4.8 oz (80.876 kg)  BMI 35.99 kg/m2  SpO2 100%  LMP 07/16/2014   Wt Readings from Last 3 Encounters:  06/13/15 178 lb 4.8 oz (80.876 kg)  06/06/15 179 lb 8 oz (81.421 kg)  05/20/15 176 lb 1.6 oz (79.878 kg)

## 2015-06-13 NOTE — Telephone Encounter (Signed)
Patient called stated she received a call from either Joylene John ,APNP or Dr Everitt Amber , Writer updated [place] , it was Dr Everitt Amber attempted to contact the patient to discuss the findings form her NM PET Image Initial (PI) Skull to Base to Thigh scan. Attempted to reach the patient , no answer , left a detailed message that Dr Everitt Amber would be contacting her later this afternoon to discuss the PEt scan results , Our contact information was provided.

## 2015-06-14 NOTE — Addendum Note (Signed)
Encounter addended by: Aigner Horseman R Skyelyn Scruggs, RN on: 06/14/2015  1:17 PM<BR>     Documentation filed: Charges VN

## 2015-06-19 ENCOUNTER — Ambulatory Visit (INDEPENDENT_AMBULATORY_CARE_PROVIDER_SITE_OTHER): Payer: BLUE CROSS/BLUE SHIELD | Admitting: Internal Medicine

## 2015-06-19 ENCOUNTER — Telehealth: Payer: Self-pay | Admitting: Gynecologic Oncology

## 2015-06-19 ENCOUNTER — Encounter (HOSPITAL_COMMUNITY): Payer: Self-pay

## 2015-06-19 ENCOUNTER — Encounter: Payer: Self-pay | Admitting: Internal Medicine

## 2015-06-19 VITALS — BP 161/66 | HR 86 | Temp 98.4°F | Ht 60.0 in | Wt 179.7 lb

## 2015-06-19 DIAGNOSIS — R5383 Other fatigue: Secondary | ICD-10-CM | POA: Diagnosis not present

## 2015-06-19 DIAGNOSIS — C541 Malignant neoplasm of endometrium: Secondary | ICD-10-CM

## 2015-06-19 DIAGNOSIS — I10 Essential (primary) hypertension: Secondary | ICD-10-CM

## 2015-06-19 DIAGNOSIS — R252 Cramp and spasm: Secondary | ICD-10-CM

## 2015-06-19 DIAGNOSIS — E039 Hypothyroidism, unspecified: Secondary | ICD-10-CM | POA: Diagnosis not present

## 2015-06-19 MED ORDER — SPIRONOLACTONE 50 MG PO TABS: 50.0000 mg | ORAL_TABLET | Freq: Every day | ORAL | Status: DC

## 2015-06-19 NOTE — Assessment & Plan Note (Signed)
Filed Vitals:   06/19/15 0844  BP: 161/66  Pulse: 86  Temp: 98.4 F (36.9 C)   BP elevated on amlodipine 10 mg + spironolactone 25mg .  Will increase spironolactone to 50mg  daily. cont amlodipine 10mg .

## 2015-06-19 NOTE — Assessment & Plan Note (Signed)
Taking her medication. TSH last checked was normal almost 1 year ago July 2016. Will recheck TSH since she is feeling fatigued.

## 2015-06-19 NOTE — Progress Notes (Signed)
   Subjective:    Patient ID: Lydia Walsh, female    DOB: 1955/10/05, 60 y.o.   MRN: [id number]  HPI  60 yo f with hx of gout, hypothyroidism, GERD, endometrial cancer, here for acute complaint of lack of energy   Lack of energy - for 1 week, sleeping well, but hard to get up during the day to get the day started. Works [place] a [place]. Eating well. No bleeding. Planning to get radiation or chemo for her endometrial cancer.  Not feeling depressed. Having some leg cramps. K was 3.9 but was low in the past. Thinks this could be from that.   HTN - BP remains elevated despite taking her meds. On amlodipine 10 mg + spironolactone 25mg  daily.   Review of Systems  Constitutional: Negative for fever and chills.  HENT: Negative for congestion.   Respiratory: Negative for chest tightness and shortness of breath.   Cardiovascular: Negative for chest pain, palpitations and leg swelling.  Gastrointestinal: Negative for abdominal pain and abdominal distention.  Genitourinary: Negative for frequency and flank pain.  Musculoskeletal: Negative for back pain and arthralgias.       Leg cramps   Neurological: Negative for dizziness and numbness.       Objective:   Physical Exam  Constitutional: She is oriented to person, place, and time. She appears well-developed and well-nourished. No distress.  HENT:  Head: Normocephalic and atraumatic.  Mouth/Throat: Oropharynx is clear and moist. No oropharyngeal exudate.  Eyes: Pupils are equal, round, and reactive to light.  Neck: Normal range of motion. No JVD present.  Cardiovascular: Normal rate and regular rhythm.  Exam reveals no gallop and no friction rub.   No murmur heard. Has systolic ejection murmur. This is chronic per patient.  Pulmonary/Chest: Effort normal and breath sounds normal. No respiratory distress. She has no wheezes.  Abdominal: Soft. Bowel sounds are normal. She exhibits no distension. There is no tenderness.    Musculoskeletal: Normal range of motion. She exhibits no edema or tenderness.  Neurological: She is alert and oriented to person, place, and time. No cranial nerve deficit. Coordination normal.  Skin: Skin is warm and dry. She is not diaphoretic.  Psychiatric: She has a normal mood and affect. Her behavior is normal.     Filed Vitals:   06/19/15 0844  BP: 161/66  Pulse: 86  Temp: 98.4 F (36.9 C)        Assessment & Plan:  See problem based a&p.

## 2015-06-19 NOTE — Patient Instructions (Signed)
Will check labs today for your fatigue.  If anything is abnormal, I will call you.   Follow up in 3 months.

## 2015-06-19 NOTE — Telephone Encounter (Signed)
Called patient about Dr. Serita Grit recommendations for chemotherapy and radiation in a sandwich fashion.  Patient has already seen Dr. Sondra Come.  Our office has reached out to Dr. Marko Plume about a new pt appt.  Per Dr. Denman George, plan for sandwich therapy with carboplatin and taxol.  Patient verbalizing understanding.  We will contact her with her upcoming appts.

## 2015-06-20 ENCOUNTER — Other Ambulatory Visit: Payer: Self-pay | Admitting: Oncology

## 2015-06-20 ENCOUNTER — Other Ambulatory Visit: Payer: BLUE CROSS/BLUE SHIELD

## 2015-06-20 DIAGNOSIS — C541 Malignant neoplasm of endometrium: Secondary | ICD-10-CM

## 2015-06-20 DIAGNOSIS — D500 Iron deficiency anemia secondary to blood loss (chronic): Secondary | ICD-10-CM

## 2015-06-20 LAB — BASIC METABOLIC PANEL
BUN / CREAT RATIO: 27 (ref 12–28)
BUN: 16 mg/dL (ref 8–27)
CHLORIDE: 97 mmol/L (ref 96–106)
CO2: 22 mmol/L (ref 18–29)
Calcium: 10.2 mg/dL (ref 8.7–10.3)
Creatinine, Ser: 0.59 mg/dL (ref 0.57–1.00)
GFR calc non Af Amer: 99 mL/min/{1.73_m2} (ref >59)
GFR, EST AFRICAN AMERICAN: 114 mL/min/{1.73_m2} (ref >59)
GLUCOSE: 107 mg/dL — AB (ref 65–99)
POTASSIUM: 4.3 mmol/L (ref 3.5–5.2)
SODIUM: 140 mmol/L (ref 134–144)

## 2015-06-20 LAB — CBC
Hematocrit: 41.2 % (ref 34.0–46.6)
Hemoglobin: 13.4 g/dL (ref 11.1–15.9)
MCH: 27.1 pg (ref 26.6–33.0)
MCHC: 32.5 g/dL (ref 31.5–35.7)
MCV: 83 fL (ref 79–97)
PLATELETS: 249 10*3/uL (ref 150–379)
RBC: 4.95 x10E6/uL (ref 3.77–5.28)
RDW: 14.9 % (ref 12.3–15.4)
WBC: 6.2 10*3/uL (ref 3.4–10.8)

## 2015-06-20 LAB — VITAMIN B12: VITAMIN B 12: 280 pg/mL (ref 211–946)

## 2015-06-20 LAB — TSH: TSH: 4.14 u[IU]/mL (ref 0.450–4.500)

## 2015-06-20 NOTE — Progress Notes (Signed)
Internal Medicine Clinic Attending  Case discussed with Dr. Ahmed at the time of the visit.  We reviewed the resident's history and exam and pertinent patient test results.  I agree with the assessment, diagnosis, and plan of care documented in the resident's note. 

## 2015-06-20 NOTE — Assessment & Plan Note (Signed)
Unclear etiology of leg cramp. Could be 2/2 to deconditioning. Checked CBC, BMET, TSH, B12. All normal. No hypokalemia.

## 2015-06-20 NOTE — Assessment & Plan Note (Signed)
Unclear etiology of fatigue. Could be 2/2 to her active endometrial cancer.  Checked TSH, CBC, BMET, and B12. All were normal. - reassured patient that we don't have any lab abnormality causing her fatigue.

## 2015-06-21 ENCOUNTER — Encounter: Payer: Self-pay | Admitting: Oncology

## 2015-06-21 ENCOUNTER — Ambulatory Visit (HOSPITAL_BASED_OUTPATIENT_CLINIC_OR_DEPARTMENT_OTHER): Payer: BLUE CROSS/BLUE SHIELD

## 2015-06-21 ENCOUNTER — Ambulatory Visit (HOSPITAL_BASED_OUTPATIENT_CLINIC_OR_DEPARTMENT_OTHER): Payer: BLUE CROSS/BLUE SHIELD | Admitting: Oncology

## 2015-06-21 ENCOUNTER — Telehealth: Payer: Self-pay | Admitting: *Deleted

## 2015-06-21 ENCOUNTER — Telehealth: Payer: Self-pay | Admitting: Oncology

## 2015-06-21 VITALS — BP 135/60 | HR 82 | Temp 98.1°F | Resp 18 | Ht 59.0 in | Wt 181.3 lb

## 2015-06-21 DIAGNOSIS — Z87891 Personal history of nicotine dependence: Secondary | ICD-10-CM

## 2015-06-21 DIAGNOSIS — N200 Calculus of kidney: Secondary | ICD-10-CM

## 2015-06-21 DIAGNOSIS — C541 Malignant neoplasm of endometrium: Secondary | ICD-10-CM | POA: Diagnosis not present

## 2015-06-21 DIAGNOSIS — N2889 Other specified disorders of kidney and ureter: Secondary | ICD-10-CM

## 2015-06-21 DIAGNOSIS — K5792 Diverticulitis of intestine, part unspecified, without perforation or abscess without bleeding: Secondary | ICD-10-CM | POA: Diagnosis not present

## 2015-06-21 DIAGNOSIS — E039 Hypothyroidism, unspecified: Secondary | ICD-10-CM

## 2015-06-21 DIAGNOSIS — D500 Iron deficiency anemia secondary to blood loss (chronic): Secondary | ICD-10-CM

## 2015-06-21 DIAGNOSIS — E669 Obesity, unspecified: Secondary | ICD-10-CM

## 2015-06-21 DIAGNOSIS — R197 Diarrhea, unspecified: Secondary | ICD-10-CM

## 2015-06-21 DIAGNOSIS — I10 Essential (primary) hypertension: Secondary | ICD-10-CM

## 2015-06-21 DIAGNOSIS — K219 Gastro-esophageal reflux disease without esophagitis: Secondary | ICD-10-CM

## 2015-06-21 DIAGNOSIS — K8020 Calculus of gallbladder without cholecystitis without obstruction: Secondary | ICD-10-CM

## 2015-06-21 DIAGNOSIS — K649 Unspecified hemorrhoids: Secondary | ICD-10-CM

## 2015-06-21 HISTORY — DX: Calculus of kidney: N20.0

## 2015-06-21 HISTORY — DX: Iron deficiency anemia secondary to blood loss (chronic): D50.0

## 2015-06-21 LAB — CBC WITH DIFFERENTIAL/PLATELET
BASO%: 0.8 % (ref 0.0–2.0)
Basophils Absolute: 0 10*3/uL (ref 0.0–0.1)
EOS ABS: 0.1 10*3/uL (ref 0.0–0.5)
EOS%: 2.3 % (ref 0.0–7.0)
HCT: 40 % (ref 34.8–46.6)
HEMOGLOBIN: 12.8 g/dL (ref 11.6–15.9)
LYMPH%: 25.7 % (ref 14.0–49.7)
MCH: 26.9 pg (ref 25.1–34.0)
MCHC: 32 g/dL (ref 31.5–36.0)
MCV: 84 fL (ref 79.5–101.0)
MONO#: 0.3 10*3/uL (ref 0.1–0.9)
MONO%: 5.1 % (ref 0.0–14.0)
NEUT#: 3.9 10*3/uL (ref 1.5–6.5)
NEUT%: 66.1 % (ref 38.4–76.8)
Platelets: 233 10*3/uL (ref 145–400)
RBC: 4.76 10*6/uL (ref 3.70–5.45)
RDW: 14.4 % (ref 11.2–14.5)
WBC: 5.9 10*3/uL (ref 3.9–10.3)
lymph#: 1.5 10*3/uL (ref 0.9–3.3)

## 2015-06-21 LAB — COMPREHENSIVE METABOLIC PANEL
ALBUMIN: 3.9 g/dL (ref 3.5–5.0)
ALK PHOS: 127 U/L (ref 40–150)
ALT: 23 U/L (ref 0–55)
AST: 18 U/L (ref 5–34)
Anion Gap: 10 mEq/L (ref 3–11)
BILIRUBIN TOTAL: 0.32 mg/dL (ref 0.20–1.20)
BUN: 21.4 mg/dL (ref 7.0–26.0)
CO2: 27 meq/L (ref 22–29)
CREATININE: 0.7 mg/dL (ref 0.6–1.1)
Calcium: 10.1 mg/dL (ref 8.4–10.4)
Chloride: 105 mEq/L (ref 98–109)
EGFR: >90 mL/min/{1.73_m2} (ref >90)
GLUCOSE: 122 mg/dL (ref 70–140)
Potassium: 4 mEq/L (ref 3.5–5.1)
SODIUM: 142 meq/L (ref 136–145)
TOTAL PROTEIN: 7.8 g/dL (ref 6.4–8.3)

## 2015-06-21 LAB — IRON AND TIBC
%SAT: 10 % — ABNORMAL LOW (ref 21–57)
Iron: 36 ug/dL — ABNORMAL LOW (ref 41–142)
TIBC: 358 ug/dL (ref 236–444)
UIBC: 322 ug/dL (ref 120–384)

## 2015-06-21 MED ORDER — ONDANSETRON HCL 8 MG PO TABS: 8.0000 mg | ORAL_TABLET | Freq: Three times a day (TID) | ORAL | Status: DC | PRN

## 2015-06-21 MED ORDER — DEXAMETHASONE 4 MG PO TABS: ORAL_TABLET | ORAL | Status: DC

## 2015-06-21 MED ORDER — LORAZEPAM 0.5 MG PO TABS: ORAL_TABLET | ORAL | Status: DC

## 2015-06-21 NOTE — Telephone Encounter (Deleted)
per pof to sch pt appt-MW sch trmt-gave pt copy of avs °

## 2015-06-21 NOTE — Telephone Encounter (Signed)
per pof to sch pt appt-MW sch trmt-gave pt copy of avs °

## 2015-06-21 NOTE — Patient Instructions (Signed)
First chemo will be Wed June 14 at 8:30 AM  The night before chemo (Tues June 13) at 8:30 PM you need to eat a little and take five of the steroid tablets (decadron/ dexamethasone).  Also that night, at 2:30 AM on Wed June 14, you need to eat a little and take five more of the steroid tablets.  We will send prescriptions for the steroid (decadron/ Dexamethasone) and for 2 nausea medicines to your pharmacy.  The nausea medicines: Zofran/ ondansetron  8 mg every 8 hrs as needed for nausea - will not make you sleepy Ativan/ lorazepam   Under tongue or swallow every 6 hrs as needed for nausea - will make you sleepy, do not drive when you take this.   Call if any questions or concerns  [phone]

## 2015-06-21 NOTE — Progress Notes (Signed)
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 female who is seen in consultation, alone for visit, at the request of Drs Denman George and Skeet Latch, for consideration of adjuvant chemotherapy for recently diagnosed IIIC1 grade 2 endometrioid endometrial carcinoma.   Patient had ~ 6 months of post menopausal bleeding, at times heavy, prior to reestablishing with Dr Lydia Walsh. [place] 02-12-15 showed slightly enlarged uterus with multiple intramural fibroids up to 3.5 cm, endometrial thickness 4 mm, polypoid lesion in cavity, ovaries grossly normal. She had hysteroscopic resection of endometrial polyp, with grade 2 endometrial adenocarcinoma. She was seen by gyn oncology on 03-29-15, with uterus large without other findings of concern. She had robotic laparoscopic hysterectomy BSO with bilateral sentinel node sampling and lysis of adhesions by Dr Skeet Latch on 05-02-15. Pathology [phone]) grade 2 endometrial adenocarcinoma with 90% invasion of myometrium (1.4 cm out of 1.5 cm), no involvement cervix or adnexa, + focal LVSI, 2 benign right sentinel nodes and no left pelvic nodes in specimen; MMR normal. Surgery was uncomplicated, patient DC on post op day 1; she has recovered well from surgery, returning to work after 3 weeks. . She had CT AP 05-24-15 with mild left common iliac adenopathy up to 1.1 cm as well as 0.7 cm low aortocaval node, and a 1.6 x 1.6 cm renal mass posterior lower left kidney indeterminate. PET 06-12-15 had SUV 4.1 at the 10 mm left common iliac node, other nodes too small for PET evaluation; mildly hyperdense lesion lower pole left kidney 1.8 cm. She saw  Dr Lydia Walsh in consultation on 06-06-15. Recommendation from surgical pathology and imaging is for chemotherapy and radiation in sandwich fashion.    REVIEW OF SYSTEMS  At usual weight. Appetite and energy good. No fever, no pain abdomen or pelvis. Some constipation since surgery but has not tried colace as recommmended. No bladder symptoms. No vaginal bleeding now, no other bleeding. Increased hot flashes since surgery. No HA, good visual acuity with glasses. No dental problems, upper dentures. NO difficulty hearing. No SOB or other respiratory symptoms. No cardiac symptoms, + murmur x years. No noted changes in breasts, due mammograms at [facility] late June. Occasional GERD does not need meds. No N/V. Degenerative arthritis knees bilaterally, slight arthritis fingers. No LE swelling. No peripheral neuropathy  Remainder of full 10 point review of systems negative.   ALLERGIES: Hctz reported rash  PAST MEDICAL/ SURGICAL HISTORY:    Para 3 HTN x years Hypothyroid x 4-5 years Bilateral knee arthroscopy Lydia Walsh) Colonoscopy 03-2005 Dr [name], diverticulosis and hemorrhoids Benign breast excisional biopsy as teenager D&C 2004, 2006 Echocardiogram 03-2010 EF 65% Elevated lipids Gallstones: pain episode evaluated in ED 03-2014   CURRENT MEDICATIONS: reviewed as listed now in EMR Prescriptions for decadron 20 mg 12 hrs and 6 hrs prior to taxol, zofran, ativan, Hemocyte or ferrous fumarate Encouraged her to use colace prn. Can additionally use miralax if needed    SOCIAL HISTORY: from [place], divorced and lives alone, 3 sons local, no grandchildren. 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 Smoked minimal amount x 1  year, [facility] 02-1994. No ETOH now, did drink in past.    FAMILY HISTORY:  No cancer in family Mother HTN, CVA, CHF Sons healthy          PHYSICAL EXAM:  height is _0  (1.499 m) and  weight is 181 lb 4.8 oz (82.237 kg). Her oral temperature is 98.1 F (36.7 C). Her blood pressure is 135/60 and her pulse is 82. Her respiration is 18 and oxygen saturation is 100%.  BMI 36.7 Alert, pleasant, cooperative lady looks stated age, comfortable.   HEENT:normal hair pattern, PERRL, not icteric. Oral mucosa moist and clear, partial dentures. Neck supple without JVD or thyroid mass.   RESPIRATORY: Lungs clear to A and P  CARDIAC/ VASCULAR: heart RRR no gallop. Peripheral pulses intact and symmetrical. Peripheral venous access appears ok to start treatment.  ABDOMEN: obese, soft, not tender no appreciable mass or organomegaly. Surgical incisions closed and appear to be healing well. Bowel sounds present and normally active.   LYMPH NODES: no cervical, supraclavicular, axillary or inguinal adenopathy  BREASTS:bilaterally without dominant mass, skin or nipple findings of concern. Wide scar right lateral breast from remote biopsy.  NEUROLOGIC: CN, motor, sensory, cerebellar nonfocal. PSYCH appropriate mood and affect  SKIN: without rash, ecchymosis, petechiae  MUSCULOSKELETAL: back not tender. No swelling UE/LE. Crepitance knees.     LABORATORY DATA:  Results for orders placed or performed in visit on 06/21/15 (from the past 48 hour(s))  CBC with Differential     Status: None   Collection Time: 06/21/15 10:41 AM  Result Value Ref Range   WBC 5.9 3.9 - 10.3 10e3/uL   NEUT# 3.9 1.5 - 6.5 10e3/uL   HGB 12.8 11.6 - 15.9 g/dL   HCT 40.0 34.8 - 46.6 %   Platelets 233 145 - 400 10e3/uL   MCV 84.0 79.5 - 101.0 fL   MCH 26.9 25.1 - 34.0 pg   MCHC 32.0 31.5 - 36.0 g/dL   RBC 4.76 3.70 - 5.45 10e6/uL   RDW 14.4 11.2 - 14.5 %   lymph# 1.5 0.9 - 3.3 10e3/uL   MONO# 0.3 0.1 - 0.9 10e3/uL   Eosinophils Absolute 0.1 0.0 - 0.5 10e3/uL   Basophils Absolute 0.0 0.0 - 0.1 10e3/uL   NEUT% 66.1 38.4 - 76.8 %   LYMPH% 25.7 14.0 - 49.7 %   MONO% 5.1 0.0 - 14.0 %   EOS% 2.3 0.0 - 7.0 %    BASO% 0.8 0.0 - 2.0 %  Comprehensive metabolic panel     Status: None   Collection Time: 06/21/15 10:41 AM  Result Value Ref Range   Sodium 142 136 - 145 mEq/L   Potassium 4.0 3.5 - 5.1 mEq/L   Chloride 105 98 - 109 mEq/L   CO2 27 22 - 29 mEq/L   Glucose 122 70 - 140 mg/dl    Comment: Glucose reference range is for nonfasting patients. Fasting glucose reference range is 70- 100.   BUN 21.4 7.0 - 26.0 mg/dL   Creatinine 0.7 0.6 - 1.1 mg/dL   Total Bilirubin 0.32 0.20 - 1.20 mg/dL   Alkaline Phosphatase 127 40 - 150 U/L   AST 18 5 - 34 U/L   ALT 23 0 - 55 U/L   Total Protein 7.8 6.4 - 8.3 g/dL   Albumin 3.9 3.5 - 5.0 g/dL   Calcium 10.1 8.4 - 10.4 mg/dL   Anion Gap 10 3 - 11 mEq/L   EGFR >90 >90 ml/min/1.73 m2  Comment: eGFR is calculated using the CKD-EPI Creatinine Equation (2009)  Iron and TIBC     Status: Abnormal   Collection Time: 06/21/15 10:41 AM  Result Value Ref Range   Iron 36 (L) 41 - 142 ug/dL   TIBC 358 236 - 444 ug/dL   UIBC 322 120 - 384 ug/dL   %SAT 10 (L) 21 - 57 %    Iron studies available after visit  PATHOLOGY: FINAL for LYNLEY, KILLILEA ([id number]) Patient: DECARLA, SIEMEN Collected: 05/02/2015 Client: [facility] Accession: [id number] Received: 05/02/2015 Lydia Morning, MD  REPORT OF SURGICAL PATHOLOGY ADDITIONAL INFORMATION: 3. Mismatch Repair (MMR) Protein Immunohistochemistry (IHC) IHC Expression Result: MLH1: Preserved nuclear expression (greater 50% tumor expression) MSH2: Preserved nuclear expression (greater 50% tumor expression) MSH6: Preserved nuclear expression (greater 50% tumor expression) PMS2: Preserved nuclear expression (greater 50% tumor expression) * Internal control demonstrates intact nuclear expression Interpretation: NORMAL There is preserved expression of the major and minor MMR proteins. There is a very low probability that microsatellite instability (MSI) is present. However, certain clinically significant MMR  protein mutations may result in preservation of nuclear expression. It is recommended that the preservation of protein expression be correlated with molecular based MSI testing. Enid Cutter MD Pathologist, Electronic Signature ( Signed 06/04/2015) FINAL DIAGNOSIS Diagnosis 1. Lymph node, sentinel, biopsy, right external iliac vein sentinel - TWO BENIGN LYMPH NODES (0/2). 2. Lymph node, sentinel, biopsy, left external iliac sentinel - BENIGN ADIPOSE TISSUE. - NO LYMPH NODE TISSUE OR MALIGNANCY. 3. Uterus +/- tubes/ovaries, neoplastic, cervix - ENDOMETRIAL ADENOCARCINOMA EXTENDING INTO THE DEEP MYOMETRIUM. - NO CERVICAL, BILATERAL FALLOPIAN TUBES OR BILATERAL OVARY INVOLVEMENT. - MARGINS NOT INVOLVED.  Microscopic Comment 3. ONCOLOGY TABLE-UTERUS, CARCINOMA OR CARCINOSARCOMA Specimen: Uterus with bilateral fallopian tubes and ovaries and right and left external iliac sentinel lymph nodes. Procedure: Hysterectomy with bilateral salpingo-oophorectomy and lymph node sampling. Lymph node sampling performed: Yes. Specimen integrity: Intact. Maximum tumor size: 7 cm Histologic type: Endometrioid adenocarcinoma. Grade: FIGO grade 2. Myometrial invasion: 1.4 cm where myometrium is 1.5 cm in thickness Cervical stromal involvement: No. Extent of involvement of other organs: None detected. Lymph - vascular invasion: Present, focal. Peritoneal washings: Pending. Lymph nodes: # examined 2. ; # positive 0 Pelvic lymph nodes: 0 involved of 2 lymph nodes. Para-aortic lymph nodes: N/A involved of N/A lymph nodes. Other (specify involvement and site): 0 TNM code: pT1b, pN0 FIGO Stage (based on pathologic findings, needs clinical correlation): IB Comment: Immunohistochemistry for cytokeratin AE1/AE3 is performed on the two right external iliac nodes and no positivity is identified.  RADIOGRAPHY: 05-24-15    Study Result     CLINICAL DATA: Deeply invasive endometrial cancer diagnosed  March 2017 status post hysterectomy and bilateral salpingo oophorectomy 05/02/2015. Treatment planning.  EXAM: CT ABDOMEN AND PELVIS WITH CONTRAST  TECHNIQUE: Multidetector CT imaging of the abdomen and pelvis was performed using the standard protocol following bolus administration of intravenous contrast.  CONTRAST: 116m ISOVUE-300 IOPAMIDOL (ISOVUE-300) INJECTION 61%  COMPARISON: 03/31/2014 abdominal sonogram. No prior CT abdomen/ pelvis.  FINDINGS: Lower chest: Calcified 2 mm granuloma in the anterior right lower lobe. Calcified 2 mm granuloma in the lingula. Left anterior descending coronary atherosclerosis.  Hepatobiliary: Normal liver with no liver mass. Gallbladder is filled with faintly peripherally calcified gallstones measuring up to the 1 cm in size. No gallbladder wall thickening or pericholecystic fluid. No biliary ductal dilatation.  Pancreas: Normal, with no mass or duct dilation.  Spleen: Normal size. No mass.  Adrenals/Urinary Tract:  Normal adrenals. Nonobstructing 4 mm stone in the lower right kidney. No hydronephrosis. There is a 1.6 x 1.6 cm renal mass in the posterior lower left kidney with density 59 HU (series 2/image 38), indeterminate. Additional subcentimeter hypodense renal cortical lesion in the anterior interpolar left kidney is too small to characterize. Normal bladder.  Stomach/Bowel: Grossly normal stomach. Normal caliber small bowel with no small bowel wall thickening. Normal appendix. Moderate sigmoid diverticulosis, with no large bowel wall thickening or pericolonic fat stranding. Oral contrast reaches the rectum.  Vascular/Lymphatic: Atherosclerotic nonaneurysmal abdominal aorta. Patent portal, splenic, hepatic and renal veins. There is mild left common iliac lymphadenopathy, largest 1.1 cm (series 2/ image 53). There is a borderline prominent rounded 0.7 cm low aortocaval node (series 2/ image 45). No additional  pathologically enlarged abdominopelvic lymph nodes.  Reproductive: Status post hysterectomy, with no mass or fluid collection at the vaginal cuff. No adnexal mass.  Other: No pneumoperitoneum, ascites or focal fluid collection.  Musculoskeletal: No aggressive appearing focal osseous lesions. Moderate degenerative changes in the visualized thoracolumbar spine.  IMPRESSION: 1. Mild left common iliac lymphadenopathy. Borderline prominent rounded low aortocaval node. These nodes are suspicious for metastatic disease. 2. No residual or recurrent tumor at the vaginal cuff. No adnexal mass. No visceral metastases. 3. Indeterminate dense 1.6 cm renal mass in the lower left kidney, for which renal cell carcinoma is to be excluded. Renal mass protocol MRI abdomen without and with IV contrast is recommended for further evaluation. If MRI is not clinically feasible, a renal mass protocol CT abdomen without and with IV contrast is recommended. 4. Additional findings include coronary atherosclerosis, cholelithiasis, nonobstructing right renal stone and moderate sigmoid diverticulosis.   CHEST 2 VIEW  04-25-15 COMPARISON: PA and lateral chest x-ray of October 29, 2014.  FINDINGS: The lungs are adequately inflated no infiltrate or abnormal masses are observed. There is an approximately 2 mm diameter calcified nodule peripherally in the right upper lobe which is stable. The heart and pulmonary vascularity are normal. The mediastinum is normal in width. There is no pleural effusion. The bony thorax exhibits no acute abnormality.  IMPRESSION: There is no active cardiopulmonary disease. There is evidence of previous granulomatous infection.    EXAM: NUCLEAR MEDICINE PET SKULL BASE TO THIGH   06-12-15  COMPARISON: CT abdomen pelvis 05/24/2015.  FINDINGS: NECK  No hypermetabolic lymph nodes in the neck. CT images show no acute findings.  CHEST  No hypermetabolic  mediastinal, hilar or axillary lymph nodes. No hypermetabolic pulmonary nodules. Heart is at the upper limits of normal in size to mildly enlarged. Coronary artery calcification. No pericardial or pleural effusion.  ABDOMEN/PELVIS  10 mm left common iliac lymph node (CT image 122) has an SUV max of 4.1. Other retroperitoneal lymph nodes described on 05/24/2015 are too small for PET resolution. Focal hypermetabolism associated with a fatty defect in the lower left lateral abdominal wall musculature (CT image 114) is nonspecific and may be postprocedural/postsurgical in etiology. No abnormal hypermetabolism in the liver, adrenal glands, spleen or pancreas. Liver is grossly unremarkable. Numerous stones are seen in the gallbladder. Adrenal glands and right kidney are unremarkable. Ill-defined and minimally hypodense lesion in the lower pole left kidney, seen and described on 05/24/2015, appears to be slightly hyperdense on the current study (series 4, image 103), measuring approximately 1.8 cm. Spleen, pancreas, stomach and bowel are grossly unremarkable. Hysterectomy. No free fluid.  SKELETON  No abnormal osseous hypermetabolism. Degenerative changes are seen in the spine.  IMPRESSION: 1. 10 mm left common iliac lymph node is mildly hypermetabolic, worrisome for metastatic disease. Additional sub cm lymph nodes seen described on 05/24/2015 may be too small for PET resolution. 2. Focal hypermetabolism along the lower left lateral abdominal musculature may be postoperative in etiology. 3. Mildly hyperdense lesion in the lower pole left kidney, also seen and described on 05/24/2015. Please refer to that report for further details and recommendations. 4. Coronary artery calcification. 5. Cholelithiasis.  CT and PET images reviewed with patient now.    DISCUSSION: All of information above reviewed with patient, including circumstances surrounding diagnosis, surgery, pathology  information, scan findings and recommendation for chemotherapy and radiation in sandwich fashion based on pathology and scan findings. Rationale for the chemo and radiation discussed, as well as sequencing of treatment. Outpatient administration with every 3 week taxol carboplatin and follow up of chemo discussed. Possible side effects of chemo reviewed, including nausea, cytopenias and taxol aches. Discussed use of steroids prior to taxol, that schedule given orally and in written form. Discussed antiemetics. She understands that she can contact this office at any time if concerns. She attended chemotherapy teaching class on 06-20-15 and is comfortable with information given there. She has given verbal consent for treatment.  Patient hopes to work during treatment, will bring FMLA papers to be filled out for intermittent leave during this treatment course.   Discussed area questioned in left kidney; mentioned right renal stone. Will check UA for blood. Likely will need urology input after present treatment completed. Discussed multiple large gallstones. Recommended avoiding all fatty foods. OK to have mammograms, due end of June OK to have eye exam also due now   IMPRESSION / PLAN:  1.IIIC1 grade 2 endometrioid endometrial carcinoma: post robotic assisted laparoscopic hysterectomy BSO and right pelvic sentinel nodes, with invasion 90% of myometrium and CT/PET evidence of left common iliac node involvement. Plan total 6 cycles of carboplatin taxol chemotherapy in sandwich fashion with RT. Will begin treatment with cycle 1 on 06-26-15 if preauthorization obtained. 2.Iron deficiency related to 6+ months of vaginal bleeding: will add oral iron if tolerates, as ferrous fumarate 3.questionable mass left kidney and right nephrolithiasis: check UA. May need urology input for left kidney probably after present treatment completes 4.hypothyroidism on medication, monitored by primary care 5.obesity, BMI 36.  Exercise limited by chronic problems knees 6. Cholelithiasis, not presently symptomatic 7.diverticulitis and hemorrhoids by colonoscopy 2007. She is due repeat colonoscopy, likely best to wait on this also until present treatment completed 8.minimal past tobacco 9.HTN, elevated lipids. Good EF 2012 10.EMR mention of OSA     Patient had questions answered to her satisfaction and is in agreement with plan above. She can contact this office for questions or concerns at any time prior to next scheduled visit. Chemo orders and granix orders placed. [place] [facility] managed care notified. Message to RN to review decadron and antiemetics prior to first treatment. Route PCP, cc Dr Denman George, Dr Radene Knee Time spent 55 min, including >50% discussion and coordination of care.    Marie Chow P, MD 06/21/2015 3:22 PM

## 2015-06-21 NOTE — Telephone Encounter (Signed)
Per staff phone call and POF I have schedueld appts. Scheduler advised of appts.  JMW  

## 2015-06-21 NOTE — Telephone Encounter (Signed)
Per Dr. Marko Plume I have scheduled first treatment appt.She will give the patient appt date/time.

## 2015-06-24 ENCOUNTER — Telehealth: Payer: Self-pay

## 2015-06-24 ENCOUNTER — Other Ambulatory Visit: Payer: Self-pay | Admitting: Internal Medicine

## 2015-06-24 DIAGNOSIS — D509 Iron deficiency anemia, unspecified: Secondary | ICD-10-CM

## 2015-06-24 NOTE — Telephone Encounter (Signed)
-----   Message from Gordy Levan, MD sent at 06/21/2015 12:54 PM EDT ----- Scripts  [employer] always fine Decadron 4 mg  Five tabs = 20 mg 12 hrs and 6 hrs before taxol, each dose with food #10 Zofran 8 mg q 8 hr prn nausea will not make drowsy #30 1RF Ativan 0.5 mg   SL or po q 6 hr prn nausea, will make sleepy #20  RN please call prior to first chemo 6-14 to review meds Please check on her by phone week of 6-19 as I will not see her until 6-26  thanks

## 2015-06-24 NOTE — Telephone Encounter (Signed)
LM that this nurse called to review the decadron premedication and antinausea medication as noted below by Dr. Marko Plume. Will call her tomorrow to discuss.

## 2015-06-25 MED ORDER — FERROUS FUMARATE 324 (106 FE) MG PO TABS: 1.0000 | ORAL_TABLET | Freq: Every day | ORAL | Status: DC

## 2015-06-25 NOTE — Telephone Encounter (Signed)
Spoke with Lydia Walsh and reviewed the decadron pre med instructions as noted below.  Used Teach Back Method.  Told her that Dr. Marko Plume said that her iron is low even though her Hemoglobin was good at visit on 06-21-15. She would like her to start taking ferrous fumarate 325 mb daily on an empty stomach with OJ.  Will send this to her pharmacy. Lydia Walsh verbalized understanding.  Reviewed stools turning black and possible increase in constipatation  with iron

## 2015-06-26 ENCOUNTER — Telehealth: Payer: Self-pay

## 2015-06-26 ENCOUNTER — Ambulatory Visit (HOSPITAL_BASED_OUTPATIENT_CLINIC_OR_DEPARTMENT_OTHER): Payer: BLUE CROSS/BLUE SHIELD

## 2015-06-26 ENCOUNTER — Other Ambulatory Visit: Payer: Self-pay | Admitting: Oncology

## 2015-06-26 ENCOUNTER — Encounter: Payer: Self-pay | Admitting: General Practice

## 2015-06-26 VITALS — BP 162/79 | HR 100 | Temp 98.1°F | Resp 18

## 2015-06-26 DIAGNOSIS — Z5111 Encounter for antineoplastic chemotherapy: Secondary | ICD-10-CM | POA: Diagnosis not present

## 2015-06-26 DIAGNOSIS — C541 Malignant neoplasm of endometrium: Secondary | ICD-10-CM

## 2015-06-26 MED ORDER — LORAZEPAM 2 MG/ML IJ SOLN: 0.5000 mg | Freq: Once | INTRAMUSCULAR | Status: DC | PRN

## 2015-06-26 MED ORDER — SODIUM CHLORIDE 0.9 % IV SOLN
Freq: Once | INTRAVENOUS | Status: AC
  Administered 2015-06-26: 09:00:00 via INTRAVENOUS

## 2015-06-26 MED ORDER — LORAZEPAM 2 MG/ML IJ SOLN
INTRAMUSCULAR | Status: AC
  Filled 2015-06-26: qty 1

## 2015-06-26 MED ORDER — SODIUM CHLORIDE 0.9 % IV SOLN
604.0000 mg | Freq: Once | INTRAVENOUS | Status: AC
  Administered 2015-06-26: 600 mg via INTRAVENOUS
  Filled 2015-06-26: qty 60

## 2015-06-26 MED ORDER — SODIUM CHLORIDE 0.9 % IV SOLN
Freq: Once | INTRAVENOUS | Status: AC
  Administered 2015-06-26: 10:00:00 via INTRAVENOUS
  Filled 2015-06-26: qty 5

## 2015-06-26 MED ORDER — PACLITAXEL CHEMO INJECTION 300 MG/50ML
175.0000 mg/m2 | Freq: Once | INTRAVENOUS | Status: AC
  Administered 2015-06-26: 324 mg via INTRAVENOUS
  Filled 2015-06-26: qty 54

## 2015-06-26 MED ORDER — SODIUM CHLORIDE 0.9 % IV SOLN
Freq: Once | INTRAVENOUS | Status: AC
  Administered 2015-06-26: 10:00:00 via INTRAVENOUS
  Filled 2015-06-26: qty 4

## 2015-06-26 MED ORDER — DIPHENHYDRAMINE HCL 50 MG/ML IJ SOLN
50.0000 mg | Freq: Once | INTRAMUSCULAR | Status: AC
  Administered 2015-06-26: 50 mg via INTRAVENOUS

## 2015-06-26 MED ORDER — FAMOTIDINE IN NACL 20-0.9 MG/50ML-% IV SOLN
INTRAVENOUS | Status: AC
  Filled 2015-06-26: qty 50

## 2015-06-26 MED ORDER — FAMOTIDINE IN NACL 20-0.9 MG/50ML-% IV SOLN
20.0000 mg | Freq: Once | INTRAVENOUS | Status: AC
  Administered 2015-06-26: 20 mg via INTRAVENOUS

## 2015-06-26 MED ORDER — DIPHENHYDRAMINE HCL 50 MG/ML IJ SOLN
INTRAMUSCULAR | Status: AC
  Filled 2015-06-26: qty 1

## 2015-06-26 NOTE — Progress Notes (Signed)
1110-BP increased while pt talking to chaplain about her son and deciding to tell him about her diagnosis. Her son told pt "a long time ago , 'mom if anything ever happened to you , I'd kill myself' ." .

## 2015-06-26 NOTE — Telephone Encounter (Signed)
Wrote prescription today stating "pt received treatment today under the care of Dr Marko Plume". Pt was pleased with wording b/c her boss tends to talk around work and she wants to keep things private.

## 2015-06-26 NOTE — Progress Notes (Signed)
Spiritual Care Note  Referred by Mindy/RN in infusion for emotional support as pt was tearful about how to tell her third son about her dx.  Per pt, elder two sons are aware, but she is feeling distress about how youngest may react because he is so close to her (age 62, stays with her).  She also identified that a support group might be helpful in managing her own distress.  Provided pastoral presence and reflective listening, normalizing feelings.  Helped her think through strategies for talking to her son: be honest, consider limiting details, offer truthful reassurance about how you are doing/feeling/coping ("I really love my doctor," "I've got good support," "They're really taking good care of me," etc).  Also provided packet of printed [place], encouraging her to utilize team (spiritual care, social work, counseling) and programming--on her own or with family.  Discussed GYN support group, which she plans to attend.  We plan to f/u by phone on Monday to debrief (pt plans to speak with son on Sunday, his day off).  Please also page if needs arise/circumstances change.  Thank you.  [place], [place], [facility] Pager [phone] Voicemail [phone]

## 2015-06-26 NOTE — Patient Instructions (Addendum)
[facility] Discharge Instructions for Patients Receiving Chemotherapy  Today you received the following chemotherapy agents: Taxotere, Carboplatin.   To help prevent nausea and vomiting after your treatment, we encourage you to take your nausea medication: Zofran every 8hrs as needed.    If you develop nausea and vomiting that is not controlled by your nausea medication, call the clinic.   BELOW ARE SYMPTOMS THAT SHOULD BE REPORTED IMMEDIATELY:  *FEVER GREATER THAN 100.5 F  *CHILLS WITH OR WITHOUT FEVER  NAUSEA AND VOMITING THAT IS NOT CONTROLLED WITH YOUR NAUSEA MEDICATION  *UNUSUAL SHORTNESS OF BREATH  *UNUSUAL BRUISING OR BLEEDING  TENDERNESS IN MOUTH AND THROAT WITH OR WITHOUT PRESENCE OF ULCERS  *URINARY PROBLEMS  *BOWEL PROBLEMS  UNUSUAL RASH Items with * indicate a potential emergency and should be followed up as soon as possible.  Feel free to call the clinic you have any questions or concerns. The clinic phone number is [phone].  Please show the [facility] at check-in to the Emergency Department and triage nurse.  Paclitaxel injection What is this medicine? PACLITAXEL (PAK li TAX el) is a chemotherapy drug. It targets fast dividing cells, like cancer cells, and causes these cells to die. This medicine is used to treat ovarian cancer, breast cancer, and other cancers. This medicine may be used for other purposes; ask your health care provider or pharmacist if you have questions. What should I tell my health care provider before I take this medicine? They need to know if you have any of these conditions: -blood disorders -irregular heartbeat -infection (especially a virus infection such as chickenpox, cold sores, or herpes) -liver disease -previous or ongoing radiation therapy -an unusual or allergic reaction to paclitaxel, alcohol, polyoxyethylated castor oil, other chemotherapy agents, other medicines, foods, dyes, or  preservatives -pregnant or trying to get pregnant -breast-feeding How should I use this medicine? This drug is given as an infusion into a vein. It is administered in a hospital or clinic by a specially trained health care professional. Talk to your pediatrician regarding the use of this medicine in children. Special care may be needed. Overdosage: If you think you have taken too much of this medicine contact a poison control center or emergency room at once. NOTE: This medicine is only for you. Do not share this medicine with others. What if I miss a dose? It is important not to miss your dose. Call your doctor or health care professional if you are unable to keep an appointment. What may interact with this medicine? Do not take this medicine with any of the following medications: -disulfiram -metronidazole This medicine may also interact with the following medications: -cyclosporine -diazepam -ketoconazole -medicines to increase blood counts like filgrastim, pegfilgrastim, sargramostim -other chemotherapy drugs like cisplatin, doxorubicin, epirubicin, etoposide, teniposide, vincristine -quinidine -testosterone -vaccines -verapamil Talk to your doctor or health care professional before taking any of these medicines: -acetaminophen -aspirin -ibuprofen -ketoprofen -naproxen This list may not describe all possible interactions. Give your health care provider a list of all the medicines, herbs, non-prescription drugs, or dietary supplements you use. Also tell them if you smoke, drink alcohol, or use illegal drugs. Some items may interact with your medicine. What should I watch for while using this medicine? Your condition will be monitored carefully while you are receiving this medicine. You will need important blood work done while you are taking this medicine. This drug may make you feel generally unwell. This is not uncommon, as chemotherapy can affect  healthy cells as well as cancer  cells. Report any side effects. Continue your course of treatment even though you feel ill unless your doctor tells you to stop. This medicine can cause serious allergic reactions. To reduce your risk you will need to take other medicine(s) before treatment with this medicine. In some cases, you may be given additional medicines to help with side effects. Follow all directions for their use. Call your doctor or health care professional for advice if you get a fever, chills or sore throat, or other symptoms of a cold or flu. Do not treat yourself. This drug decreases your body's ability to fight infections. Try to avoid being around people who are sick. This medicine may increase your risk to bruise or bleed. Call your doctor or health care professional if you notice any unusual bleeding. Be careful brushing and flossing your teeth or using a toothpick because you may get an infection or bleed more easily. If you have any dental work done, tell your dentist you are receiving this medicine. Avoid taking products that contain aspirin, acetaminophen, ibuprofen, naproxen, or ketoprofen unless instructed by your doctor. These medicines may hide a fever. Do not become pregnant while taking this medicine. Women should inform their doctor if they wish to become pregnant or think they might be pregnant. There is a potential for serious side effects to an unborn child. Talk to your health care professional or pharmacist for more information. Do not breast-feed an infant while taking this medicine. Men are advised not to father a child while receiving this medicine. This product may contain alcohol. Ask your pharmacist or healthcare provider if this medicine contains alcohol. Be sure to tell all healthcare providers you are taking this medicine. Certain medicines, like metronidazole and disulfiram, can cause an unpleasant reaction when taken with alcohol. The reaction includes flushing, headache, nausea, vomiting,  sweating, and increased thirst. The reaction can last from 30 minutes to several hours. What side effects may I notice from receiving this medicine? Side effects that you should report to your doctor or health care professional as soon as possible: -allergic reactions like skin rash, itching or hives, swelling of the face, lips, or tongue -low blood counts - This drug may decrease the number of white blood cells, red blood cells and platelets. You may be at increased risk for infections and bleeding. -signs of infection - fever or chills, cough, sore throat, pain or difficulty passing urine -signs of decreased platelets or bleeding - bruising, pinpoint red spots on the skin, black, tarry stools, nosebleeds -signs of decreased red blood cells - unusually weak or tired, fainting spells, lightheadedness -breathing problems -chest pain -high or low blood pressure -mouth sores -nausea and vomiting -pain, swelling, redness or irritation at the injection site -pain, tingling, numbness in the hands or feet -slow or irregular heartbeat -swelling of the ankle, feet, hands Side effects that usually do not require medical attention (report to your doctor or health care professional if they continue or are bothersome): -bone pain -complete hair loss including hair on your head, underarms, pubic hair, eyebrows, and eyelashes -changes in the color of fingernails -diarrhea -loosening of the fingernails -loss of appetite -muscle or joint pain -red flush to skin -sweating This list may not describe all possible side effects. Call your doctor for medical advice about side effects. You may report side effects to FDA at 1-800-FDA-1088. Where should I keep my medicine? This drug is given in a hospital or clinic and will  not be stored at home. NOTE: This sheet is a summary. It may not cover all possible information. If you have questions about this medicine, talk to your doctor, pharmacist, or health care  provider.    2016, Elsevier/Gold Standard. (2014-08-16 13:02:56) Carboplatin injection What is this medicine? CARBOPLATIN (KAR boe pla tin) is a chemotherapy drug. It targets fast dividing cells, like cancer cells, and causes these cells to die. This medicine is used to treat ovarian cancer and many other cancers. This medicine may be used for other purposes; ask your health care provider or pharmacist if you have questions. What should I tell my health care provider before I take this medicine? They need to know if you have any of these conditions: -blood disorders -hearing problems -kidney disease -recent or ongoing radiation therapy -an unusual or allergic reaction to carboplatin, cisplatin, other chemotherapy, other medicines, foods, dyes, or preservatives -pregnant or trying to get pregnant -breast-feeding How should I use this medicine? This drug is usually given as an infusion into a vein. It is administered in a hospital or clinic by a specially trained health care professional. Talk to your pediatrician regarding the use of this medicine in children. Special care may be needed. Overdosage: If you think you have taken too much of this medicine contact a poison control center or emergency room at once. NOTE: This medicine is only for you. Do not share this medicine with others. What if I miss a dose? It is important not to miss a dose. Call your doctor or health care professional if you are unable to keep an appointment. What may interact with this medicine? -medicines for seizures -medicines to increase blood counts like filgrastim, pegfilgrastim, sargramostim -some antibiotics like amikacin, gentamicin, neomycin, streptomycin, tobramycin -vaccines Talk to your doctor or health care professional before taking any of these medicines: -acetaminophen -aspirin -ibuprofen -ketoprofen -naproxen This list may not describe all possible interactions. Give your health care provider a  list of all the medicines, herbs, non-prescription drugs, or dietary supplements you use. Also tell them if you smoke, drink alcohol, or use illegal drugs. Some items may interact with your medicine. What should I watch for while using this medicine? Your condition will be monitored carefully while you are receiving this medicine. You will need important blood work done while you are taking this medicine. This drug may make you feel generally unwell. This is not uncommon, as chemotherapy can affect healthy cells as well as cancer cells. Report any side effects. Continue your course of treatment even though you feel ill unless your doctor tells you to stop. In some cases, you may be given additional medicines to help with side effects. Follow all directions for their use. Call your doctor or health care professional for advice if you get a fever, chills or sore throat, or other symptoms of a cold or flu. Do not treat yourself. This drug decreases your body's ability to fight infections. Try to avoid being around people who are sick. This medicine may increase your risk to bruise or bleed. Call your doctor or health care professional if you notice any unusual bleeding. Be careful brushing and flossing your teeth or using a toothpick because you may get an infection or bleed more easily. If you have any dental work done, tell your dentist you are receiving this medicine. Avoid taking products that contain aspirin, acetaminophen, ibuprofen, naproxen, or ketoprofen unless instructed by your doctor. These medicines may hide a fever. Do not become pregnant while  taking this medicine. Women should inform their doctor if they wish to become pregnant or think they might be pregnant. There is a potential for serious side effects to an unborn child. Talk to your health care professional or pharmacist for more information. Do not breast-feed an infant while taking this medicine. What side effects may I notice from  receiving this medicine? Side effects that you should report to your doctor or health care professional as soon as possible: -allergic reactions like skin rash, itching or hives, swelling of the face, lips, or tongue -signs of infection - fever or chills, cough, sore throat, pain or difficulty passing urine -signs of decreased platelets or bleeding - bruising, pinpoint red spots on the skin, black, tarry stools, nosebleeds -signs of decreased red blood cells - unusually weak or tired, fainting spells, lightheadedness -breathing problems -changes in hearing -changes in vision -chest pain -high blood pressure -low blood counts - This drug may decrease the number of white blood cells, red blood cells and platelets. You may be at increased risk for infections and bleeding. -nausea and vomiting -pain, swelling, redness or irritation at the injection site -pain, tingling, numbness in the hands or feet -problems with balance, talking, walking -trouble passing urine or change in the amount of urine Side effects that usually do not require medical attention (report to your doctor or health care professional if they continue or are bothersome): -hair loss -loss of appetite -metallic taste in the mouth or changes in taste This list may not describe all possible side effects. Call your doctor for medical advice about side effects. You may report side effects to FDA at 1-800-FDA-1088. Where should I keep my medicine? This drug is given in a hospital or clinic and will not be stored at home. NOTE: This sheet is a summary. It may not cover all possible information. If you have questions about this medicine, talk to your doctor, pharmacist, or health care provider.    2016, Elsevier/Gold Standard. (2007-04-05 14:38:05)

## 2015-06-28 ENCOUNTER — Telehealth: Payer: Self-pay

## 2015-06-28 ENCOUNTER — Telehealth: Payer: Self-pay | Admitting: Oncology

## 2015-06-28 NOTE — Telephone Encounter (Signed)
MEDICAL ONCOLOGY  Call from answering service re aches and heartburn, first carbo taxol on 06-26-15.  MD called patient now, at work until 1000. Generalized aches consistent with taxol aches began in last couple of hours, has not tried anything for these. Some belching and heartburn. Bowels have moved, no nausea. No SOB.  Discussed taxol aches beginning a day or a couple of days after chemo and lasting a day or a few days. For aches she can try tylenol or ibuprofen (with food and fluids), hot shower, heating pad etc; I believe that she also has percocet available from surgery, tho narcotics not necessarily helpful with aches.  For belching and heartburn ok to use OTC zantac etc, generics ok.  She can call back if needed. She has work excuse if needs to leave early.  Patient sounded good on phone and appreciated call.  L.Livesay,MD

## 2015-06-28 NOTE — Telephone Encounter (Signed)
-----   Message from Gordy Levan, MD sent at 06/28/2015  7:40 AM EDT ----- Called with taxol aches and heartburn/ belching post first carbo taxol on 6-14. See phone note If you hear back from her or check on her later today, can also suggest claritin for aches  thanks

## 2015-06-28 NOTE — Telephone Encounter (Signed)
Patient Demographics     Patient Name Sex DOB SSN Address Phone    Lydia Walsh, Lydia Walsh Female 1955/08/13 [id number] [place] [address] [phone] (Home) *Preferred* [phone] (Mobile)      chemo follow up-livesayCall patient  Received: Yesterday    Ardeen Garland, RN  Baruch Merl, RN           1st taxol, carbo-did well  No restless legs from benadryl  Met with Lattie Haw , chaplain, about whether to tell her youngest son  She has cancer - afraid he will hurt himself.

## 2015-06-28 NOTE — Telephone Encounter (Signed)
Ms Mercier's knees and legs are sore from the Taxol.  She also  has arthritis in her knees which is compounding the discomfort. Told her to try Claritin 10 mg daily for 7 days.  This can help decrease the aches.   Encouraged her to take a warm shower and if possible purchase a heating pad as warmth can help sooth the aches. Suggested she try the Zofran tablet to see if this settles her stomach as she has to wait until this evening for her son to pick up other meds.(antiacid) Reviewed drinking 64 oz of fluid a day.  She has not taking in much fluid and only crackers due to heartburn.   She will call [phone] if she has any further concerns or questions.

## 2015-07-01 ENCOUNTER — Encounter: Payer: Self-pay | Admitting: General Practice

## 2015-07-01 ENCOUNTER — Telehealth: Payer: Self-pay

## 2015-07-01 NOTE — Progress Notes (Signed)
Spiritual Care Note  Followed up with Lydia Walsh by phone as planned.  She was tired from chemo, but emotionally steadier.  She has not yet resolved her presenting emotional concern for timing-sensitivity reasons, but states that she has a plan.  She also verbalized desire to participate in GYN cancer group because she needs emotional support from a community of peers.  Lydia Walsh is aware of ongoing Support Team availability and plans to f/u for processing/debriefing after she speaks with her son, but please also page if needs arise/circumstances change.  Thank you.  [place], [place], [address] [facility] Pager [phone] Voicemail [phone]

## 2015-07-01 NOTE — Telephone Encounter (Signed)
Told Ms. Rigsbee that the FMLA forms she brought in on 06-26-15 at her chemotherapy appointment cannot be located.   She will bring new forms to the [facility] tomorrow between 1000-1100 to the front desk and ask for Dr. Mariana Kaufman Nurse Barbaraann Share.

## 2015-07-02 ENCOUNTER — Other Ambulatory Visit (HOSPITAL_COMMUNITY)
Admission: AD | Admit: 2015-07-02 | Discharge: 2015-07-02 | Disposition: A | Discharge status: Home / Self Care | Payer: BLUE CROSS/BLUE SHIELD | Source: Ambulatory Visit | Attending: Nurse Practitioner | Admitting: Nurse Practitioner

## 2015-07-02 ENCOUNTER — Other Ambulatory Visit (HOSPITAL_BASED_OUTPATIENT_CLINIC_OR_DEPARTMENT_OTHER): Payer: BLUE CROSS/BLUE SHIELD

## 2015-07-02 ENCOUNTER — Telehealth: Payer: Self-pay | Admitting: Nurse Practitioner

## 2015-07-02 ENCOUNTER — Ambulatory Visit (HOSPITAL_BASED_OUTPATIENT_CLINIC_OR_DEPARTMENT_OTHER): Payer: BLUE CROSS/BLUE SHIELD

## 2015-07-02 ENCOUNTER — Ambulatory Visit (HOSPITAL_BASED_OUTPATIENT_CLINIC_OR_DEPARTMENT_OTHER): Payer: BLUE CROSS/BLUE SHIELD | Admitting: Nurse Practitioner

## 2015-07-02 ENCOUNTER — Encounter: Payer: Self-pay | Admitting: Nurse Practitioner

## 2015-07-02 VITALS — BP 136/69 | HR 93 | Temp 98.0°F | Resp 18 | Ht 59.0 in | Wt 170.7 lb

## 2015-07-02 DIAGNOSIS — R21 Rash and other nonspecific skin eruption: Secondary | ICD-10-CM | POA: Insufficient documentation

## 2015-07-02 DIAGNOSIS — C541 Malignant neoplasm of endometrium: Secondary | ICD-10-CM | POA: Diagnosis not present

## 2015-07-02 DIAGNOSIS — R634 Abnormal weight loss: Secondary | ICD-10-CM | POA: Diagnosis not present

## 2015-07-02 DIAGNOSIS — R197 Diarrhea, unspecified: Secondary | ICD-10-CM

## 2015-07-02 DIAGNOSIS — E860 Dehydration: Secondary | ICD-10-CM | POA: Insufficient documentation

## 2015-07-02 DIAGNOSIS — D500 Iron deficiency anemia secondary to blood loss (chronic): Secondary | ICD-10-CM

## 2015-07-02 LAB — C DIFFICILE QUICK SCREEN W PCR REFLEX
C DIFFICILE (CDIFF) INTERP: NEGATIVE
C DIFFICILE (CDIFF) TOXIN: NEGATIVE
C DIFFICLE (CDIFF) ANTIGEN: NEGATIVE

## 2015-07-02 LAB — CBC WITH DIFFERENTIAL/PLATELET
BASO%: 0.4 % (ref 0.0–2.0)
Basophils Absolute: 0 10*3/uL (ref 0.0–0.1)
EOS%: 3.8 % (ref 0.0–7.0)
Eosinophils Absolute: 0.2 10*3/uL (ref 0.0–0.5)
HCT: 40.7 % (ref 34.8–46.6)
HGB: 13 g/dL (ref 11.6–15.9)
LYMPH%: 20.4 % (ref 14.0–49.7)
MCH: 26.5 pg (ref 25.1–34.0)
MCHC: 31.9 g/dL (ref 31.5–36.0)
MCV: 83.1 fL (ref 79.5–101.0)
MONO#: 0.1 10*3/uL (ref 0.1–0.9)
MONO%: 1 % (ref 0.0–14.0)
NEUT#: 3.7 10*3/uL (ref 1.5–6.5)
NEUT%: 74.4 % (ref 38.4–76.8)
Platelets: 163 10*3/uL (ref 145–400)
RBC: 4.9 10*6/uL (ref 3.70–5.45)
RDW: 14.5 % (ref 11.2–14.5)
WBC: 5 10*3/uL (ref 3.9–10.3)
lymph#: 1 10*3/uL (ref 0.9–3.3)

## 2015-07-02 LAB — COMPREHENSIVE METABOLIC PANEL
ALT: 39 U/L (ref 0–55)
ANION GAP: 14 meq/L — AB (ref 3–11)
AST: 34 U/L (ref 5–34)
Albumin: 3.9 g/dL (ref 3.5–5.0)
Alkaline Phosphatase: 110 U/L (ref 40–150)
BUN: 25.7 mg/dL (ref 7.0–26.0)
CHLORIDE: 103 meq/L (ref 98–109)
CO2: 21 meq/L — AB (ref 22–29)
CREATININE: 0.8 mg/dL (ref 0.6–1.1)
Calcium: 9.6 mg/dL (ref 8.4–10.4)
EGFR: 86 mL/min/{1.73_m2} — ABNORMAL LOW (ref >90)
Glucose: 158 mg/dl — ABNORMAL HIGH (ref 70–140)
POTASSIUM: 3.6 meq/L (ref 3.5–5.1)
Sodium: 138 mEq/L (ref 136–145)
Total Bilirubin: 0.58 mg/dL (ref 0.20–1.20)
Total Protein: 8 g/dL (ref 6.4–8.3)

## 2015-07-02 MED ORDER — METHYLPREDNISOLONE SODIUM SUCC 125 MG IJ SOLR
60.0000 mg | Freq: Once | INTRAMUSCULAR | Status: AC
  Administered 2015-07-02: 60 mg via INTRAVENOUS

## 2015-07-02 MED ORDER — DIPHENHYDRAMINE HCL 50 MG/ML IJ SOLN
25.0000 mg | Freq: Once | INTRAMUSCULAR | Status: AC
  Administered 2015-07-02: 25 mg via INTRAVENOUS

## 2015-07-02 MED ORDER — SODIUM CHLORIDE 0.9 % IV SOLN
INTRAVENOUS | Status: AC
  Administered 2015-07-02: 14:00:00 via INTRAVENOUS

## 2015-07-02 MED ORDER — FAMOTIDINE IN NACL 20-0.9 MG/50ML-% IV SOLN
20.0000 mg | Freq: Once | INTRAVENOUS | Status: AC
  Administered 2015-07-02: 20 mg via INTRAVENOUS

## 2015-07-02 MED ORDER — METHYLPREDNISOLONE SODIUM SUCC 125 MG IJ SOLR
INTRAMUSCULAR | Status: AC
  Filled 2015-07-02: qty 2

## 2015-07-02 MED ORDER — FAMOTIDINE IN NACL 20-0.9 MG/50ML-% IV SOLN
INTRAVENOUS | Status: AC
  Filled 2015-07-02: qty 50

## 2015-07-02 MED ORDER — DIPHENHYDRAMINE HCL 50 MG/ML IJ SOLN
INTRAMUSCULAR | Status: AC
  Filled 2015-07-02: qty 1

## 2015-07-02 NOTE — Assessment & Plan Note (Addendum)
Patient states that she developed a scattered rash that was pruritic to her generalized body within the past 24 hours.  She states that the itching to her right posterior shoulder blade has increased within the last hour.  She denies any other allergic-type symptoms whatsoever.  On exam today-patient has multiple, scattered, generalized raised rash to her body; and has some significant hives to the right posterior shoulder region the patient is scratching at.  Patient denies any other allergicsymptoms whatsoever.  She is managing her airway with no difficulty.  Patient will receive IV fluid rehydration for her dehydration today; will also receive Benadryl 25 mg IV, Pepcid 20 mg IV, and solumedrol 60 mg IV.  Patient will also be instructed in regards to Benadryl and Pepcid use at home for any mild allergic--type symptoms whatsoever.  Patient was also encouraged to go directly to the emergency department if she develops any worsening hypersensitivity reaction symptoms whatsoever.  Note: Reviewed all details of today's visit with Dr. Marko Plume via phone-and she was in agreement with plan of care.  She also suggested that patient follow up with either her primary care provider or a local dermatologist if the rash continues.  Dr. Marko Plume does not feel that the rash is related to the chemotherapy; but most likely secondary to some other source.  Of note-patient has had a food allergy/reaction in the past as well.  However, patient denies any new medications, lotions, shampoos, perfumes, or foods. _____________________________________________________________  Update: Patient was monitored very closely by this provider for proximally 2 hours while in the infusion room.  Her hives completely resolved; but patient continued with the scattered, raised rash.  Patient was given instructions verbally and printed regarding the use of both Benadryl and Pepcid.  Also, patient states that she has a food allergy to  shellfish; but denies eating any shellfish recently.  She does, however,-admit to eat fish on multiple occasions recently.  Patient was once again advised to follow-up with her primary care provider or a local dermatologist if rash continues.  Also, if patient experiences any worsening reaction symptoms, she should go directly to the emergency department for further evaluation and management.

## 2015-07-02 NOTE — Assessment & Plan Note (Signed)
Patient has lost approximate 6 pounds since her last weight check.  Patient states she has had decreased appetite and decreased oral intake as well.  She does feel dehydrated today and will receive IV fluid rehydration.  She was encouraged to eat multiple small meals throughout the day; and to try to increase her protein intake as much as possible.

## 2015-07-02 NOTE — Telephone Encounter (Signed)
Ms. Cea came in to bring in [place] papers. She began with a red, Itchy slightly raised rash yesterday under upper arms and back of legs.  She has been using alcohol  to help with itching. She also stated that she began with diarrhea Sunday.  Moving bowels 3-4 times a day-mostly water.   She has had 3 watery stools this am thus far. patinet states no N/v  Taking in ~64 oz of fluid daily. Tired and washed out feeling.  Weight 169.8 today. This down ~10 lbs since 06-21-15. She is going to have stat cbc and c-met and see Cyndee Bacon in the symptom management clicic at ~7871 today.

## 2015-07-02 NOTE — Assessment & Plan Note (Signed)
Patient has had decreased appetite and poor oral intake.  She's also had frequent diarrhea for the past 4 days.  She appears dehydrated.  Will receive IV fluid rehydration on the cancer Center.

## 2015-07-02 NOTE — Progress Notes (Signed)
5 attempts to access peripheral IV site on patient today.  Patient inquired about possible port-a-cath placement and education done.  Message left with desk nurse concerning order for [facility] placement.  Patient ambulates well at discharge with no signs or symptoms of distress, or dizziness.  Post vital signs stable.

## 2015-07-02 NOTE — Progress Notes (Signed)
SYMPTOM MANAGEMENT CLINIC    Chief Complaint: Diarrhea, rash  HPI:  Lydia Walsh 60 y.o. female diagnosed with endometrial cancer.  Currently undergoing carboplatin/Taxol chemotherapy regimen.  Patient states that she developed a scattered rash that was pruritic to her generalized body within the past 24 hours.  She states that the itching to her right posterior shoulder blade has increased within the last hour.  She denies any other allergic-type symptoms whatsoever.  Patient also reports onset of diarrhea approximate 4 days ago.  She feels dehydrated today.  She states that she has also had decreased appetite and has lost weight since her last weight check.  She denies any recent fevers or chills.  No history exists.    Review of Systems  Constitutional: Positive for weight loss and malaise/fatigue.  Gastrointestinal: Positive for abdominal pain and diarrhea.  Skin: Positive for itching and rash.  Neurological: Positive for weakness.  All other systems reviewed and are negative.   Past Medical History  Diagnosis Date  . Hyperlipidemia   . Hypertension   . Hypothyroidism   . Osteoarthritis   . Endometrial polyp   . History of uterine fibroid   . PMB (postmenopausal bleeding)   . OSA (obstructive sleep apnea)     moderate OSA per study 06-21-2005--  per pt does need cpap anymore never purchased it  . GERD (gastroesophageal reflux disease)   . Wears glasses   . Wears dentures     upper  . Heart murmur   . Cancer [facility])     endometrial cancer    Past Surgical History  Procedure Laterality Date  . Knee arthroscopy w/ meniscectomy Bilateral right 04-12-2006//  left 12-29-2006    and Chondroplasty  . Excision benign right breast mass  age 15  . Dilatation & currettage/hysteroscopy with resectocope  12-13-2002  &  09-26-2004    polyp/  submucosal fibroid  . Transthoracic echocardiogram  04-02-2010    normal LV, ef 65%  . Tubal ligation  1980's  . Dilatation &  curettage/hysteroscopy with myosure N/A 03/07/2015    Procedure: DILATATION & CURETTAGE/HYSTEROSCOPY WITH MYOSURE (POLYP);  Surgeon: Arvella Nigh, MD;  Location: [facility];  Service: Gynecology;  Laterality: N/A;  . Robotic assisted total hysterectomy with bilateral salpingo oopherectomy Bilateral 05/02/2015    Procedure: XI ROBOTIC ASSISTED TOTAL HYSTERECTOMY WITH BILATERAL SALPINGO OOPHORECTOMY AND SENTINAL LYMPH NODE BIOPSY;  Surgeon: Janie Morning, MD;  Location: [facility];  Service: Gynecology;  Laterality: Bilateral;    has Hypothyroidism; Dyslipidemia; GOUT; OBSTRUCTIVE SLEEP APNEA; Essential hypertension; ALLERGIC RHINITIS, SEASONAL; G E R D; LOW BACK PAIN, CHRONIC; Osteoarthritis; Allergic reaction to food; Unspecified constipation; Health care maintenance; Fatigue; Major depression ([place]); Palpitations; Atypical chest pain; Arm pain, left; Postmenopausal vaginal bleeding; Leg cramping; Status post hysteroscopy; Endometrial cancer ([place]); Obesity (BMI 35.0-39.9 without comorbidity) ([place]); Renal mass, left; Cholelithiasis without cholecystitis; Nephrolithiasis; Obesity (BMI 30-39.9); Iron deficiency anemia due to chronic blood loss; Diverticulitis of intestine without perforation or abscess without bleeding; Diarrhea; Rash; Weight loss; and Dehydration on her problem list.    is allergic to hctz.    Medication List       This list is accurate as of: 07/02/15  5:30 PM.  Always use your most recent med list.               allopurinol 100 MG tablet  Commonly known as:  ZYLOPRIM  TAKE ONE TABLET BY MOUTH ONCE DAILY     amLODipine 10 MG  tablet  Commonly known as:  NORVASC  TAKE ONE TABLET BY MOUTH ONCE DAILY     dexamethasone 4 MG tablet  Commonly known as:  DECADRON  Take 5 tablets with food 12 hrs and 6 hrs before taxol     docusate sodium 100 MG capsule  Commonly known as:  COLACE  Take 1 capsule (100 mg total) by mouth 2 (two) times daily.     Ferrous Fumarate 324  (106 Fe) MG Tabs tablet  Commonly known as:  HEMOCYTE - 106 mg FE  Take 1 tablet (106 mg of iron total) by mouth daily. On an empty stomach with OJ     ibuprofen 200 MG tablet  Commonly known as:  ADVIL,MOTRIN  Take 200 mg by mouth every 6 (six) hours as needed for mild pain.     levothyroxine 112 MCG tablet  Commonly known as:  SYNTHROID, LEVOTHROID  Take 1 tablet (112 mcg total) by mouth daily.     LORazepam 0.5 MG tablet  Commonly known as:  ATIVAN  Place 1 tablet under the tongue or swallow every 6 hours as needed for nausea. Will make sleepy.     ondansetron 8 MG tablet  Commonly known as:  ZOFRAN  Take 1 tablet (8 mg total) by mouth every 8 (eight) hours as needed for nausea. Will make drowsy.     oxyCODONE-acetaminophen 5-325 MG tablet  Commonly known as:  PERCOCET/ROXICET  Take 1-2 tablets by mouth every 4 (four) hours as needed (moderate to severe pain).     simvastatin 20 MG tablet  Commonly known as:  ZOCOR  TAKE ONE TABLET BY MOUTH IN THE EVENING     spironolactone 25 MG tablet  Commonly known as:  ALDACTONE  Take 25 mg by mouth daily.     vitamin E 400 UNIT capsule  Generic drug:  vitamin E  Take 400 Units by mouth daily.         PHYSICAL EXAMINATION  Oncology Vitals 07/02/2015 07/02/2015  Height - -  Weight - -  Weight (lbs) - -  BMI (kg/m2) - -  Temp 98.6 98  Pulse 92 88  Resp 20 20  SpO2 100 100  BSA (m2) - -   BP Readings from Last 2 Encounters:  07/02/15 137/63  07/02/15 136/69    Physical Exam  Constitutional: She is oriented to person, place, and time and well-developed, well-nourished, and in no distress.  HENT:  Head: Normocephalic and atraumatic.  Mouth/Throat: Oropharynx is clear and moist.  Eyes: Conjunctivae and EOM are normal. Pupils are equal, round, and reactive to light. Right eye exhibits no discharge. Left eye exhibits no discharge. No scleral icterus.  Neck: Normal range of motion. Neck supple. No JVD present. No tracheal  deviation present. No thyromegaly present.  Cardiovascular: Normal rate, regular rhythm, normal heart sounds and intact distal pulses.   Pulmonary/Chest: Effort normal and breath sounds normal. No respiratory distress. She has no wheezes. She has no rales. She exhibits no tenderness.  Abdominal: Soft. Bowel sounds are normal. She exhibits no distension and no mass. There is no tenderness. There is no rebound and no guarding.  Musculoskeletal: Normal range of motion. She exhibits no edema or tenderness.  Lymphadenopathy:    She has no cervical adenopathy.  Neurological: She is alert and oriented to person, place, and time. Gait normal.  Skin: Skin is warm and dry. Rash noted. No erythema. No pallor.  On exam today-patient has multiple, scattered, generalized raised rash  to her body; and has some significant hives to the right posterior shoulder region the patient is scratching at.  Patient denies any other allergicsymptoms whatsoever.  She is managing her airway with no difficulty.         Psychiatric: Affect normal.  Nursing note and vitals reviewed.   LABORATORY DATA:. Hospital Outpatient Visit on 07/02/2015  Component Date Value Ref Range Status  . C Diff antigen 07/02/2015 NEGATIVE  NEGATIVE Final  . C Diff toxin 07/02/2015 NEGATIVE  NEGATIVE Final  . C Diff interpretation 07/02/2015 Negative for toxigenic C. difficile   Final  Appointment on 07/02/2015  Component Date Value Ref Range Status  . WBC 07/02/2015 5.0  3.9 - 10.3 10e3/uL Final  . NEUT# 07/02/2015 3.7  1.5 - 6.5 10e3/uL Final  . HGB 07/02/2015 13.0  11.6 - 15.9 g/dL Final  . HCT 07/02/2015 40.7  34.8 - 46.6 % Final  . Platelets 07/02/2015 163 Large platelets present, Occ small plt clump  145 - 400 10e3/uL Final  . MCV 07/02/2015 83.1  79.5 - 101.0 fL Final  . MCH 07/02/2015 26.5  25.1 - 34.0 pg Final  . MCHC 07/02/2015 31.9  31.5 - 36.0 g/dL Final  . RBC 07/02/2015 4.90  3.70 - 5.45 10e6/uL Final  . RDW 07/02/2015  14.5  11.2 - 14.5 % Final  . lymph# 07/02/2015 1.0  0.9 - 3.3 10e3/uL Final  . MONO# 07/02/2015 0.1  0.1 - 0.9 10e3/uL Final  . Eosinophils Absolute 07/02/2015 0.2  0.0 - 0.5 10e3/uL Final  . Basophils Absolute 07/02/2015 0.0  0.0 - 0.1 10e3/uL Final  . NEUT% 07/02/2015 74.4  38.4 - 76.8 % Final  . LYMPH% 07/02/2015 20.4  14.0 - 49.7 % Final  . MONO% 07/02/2015 1.0  0.0 - 14.0 % Final  . EOS% 07/02/2015 3.8  0.0 - 7.0 % Final  . BASO% 07/02/2015 0.4  0.0 - 2.0 % Final  . Sodium 07/02/2015 138  136 - 145 mEq/L Final  . Potassium 07/02/2015 3.6  3.5 - 5.1 mEq/L Final  . Chloride 07/02/2015 103  98 - 109 mEq/L Final  . CO2 07/02/2015 21* 22 - 29 mEq/L Final  . Glucose 07/02/2015 158* 70 - 140 mg/dl Final   Glucose reference range is for nonfasting patients. Fasting glucose reference range is 70- 100.  Marland Kitchen BUN 07/02/2015 25.7  7.0 - 26.0 mg/dL Final  . Creatinine 07/02/2015 0.8  0.6 - 1.1 mg/dL Final  . Total Bilirubin 07/02/2015 0.58  0.20 - 1.20 mg/dL Final  . Alkaline Phosphatase 07/02/2015 110  40 - 150 U/L Final  . AST 07/02/2015 34  5 - 34 U/L Final  . ALT 07/02/2015 39  0 - 55 U/L Final  . Total Protein 07/02/2015 8.0  6.4 - 8.3 g/dL Final  . Albumin 07/02/2015 3.9  3.5 - 5.0 g/dL Final  . Calcium 07/02/2015 9.6  8.4 - 10.4 mg/dL Final  . Anion Gap 07/02/2015 14* 3 - 11 mEq/L Final  . EGFR 07/02/2015 86* >90 ml/min/1.73 m2 Final   eGFR is calculated using the CKD-EPI Creatinine Equation (2009)    RADIOGRAPHIC STUDIES: No results found.  ASSESSMENT/PLAN:    Weight loss Patient has lost approximate 6 pounds since her last weight check.  Patient states she has had decreased appetite and decreased oral intake as well.  She does feel dehydrated today and will receive IV fluid rehydration.  She was encouraged to eat multiple small meals throughout the day; and to  try to increase her protein intake as much as possible.  Rash Patient states that she developed a scattered rash that was  pruritic to her generalized body within the past 24 hours.  She states that the itching to her right posterior shoulder blade has increased within the last hour.  She denies any other allergic-type symptoms whatsoever.  On exam today-patient has multiple, scattered, generalized raised rash to her body; and has some significant hives to the right posterior shoulder region the patient is scratching at.  Patient denies any other allergicsymptoms whatsoever.  She is managing her airway with no difficulty.  Patient will receive IV fluid rehydration for her dehydration today; will also receive Benadryl 25 mg IV, Pepcid 20 mg IV, and solumedrol 60 mg IV.  Patient will also be instructed in regards to Benadryl and Pepcid use at home for any mild allergic--type symptoms whatsoever.  Patient was also encouraged to go directly to the emergency department if she develops any worsening hypersensitivity reaction symptoms whatsoever.  Note: Reviewed all details of today's visit with Dr. Marko Plume via phone-and she was in agreement with plan of care.  She also suggested that patient follow up with either her primary care provider or a local dermatologist if the rash continues.  Dr. Marko Plume does not feel that the rash is related to the chemotherapy; but most likely secondary to some other source.  Of note-patient has had a food allergy/reaction in the past as well.  However, patient denies any new medications, lotions, shampoos, perfumes, or foods. _____________________________________________________________  Update: Patient was monitored very closely by this provider for proximally 2 hours while in the infusion room.  Her hives completely resolved; but patient continued with the scattered, raised rash.  Patient was given instructions verbally and printed regarding the use of both Benadryl and Pepcid.  Also, patient states that she has a food allergy to shellfish; but denies eating any shellfish recently.  She does,  however,-admit to eat fish on multiple occasions recently.  Patient was once again advised to follow-up with her primary care provider or a local dermatologist if rash continues.  Also, if patient experiences any worsening reaction symptoms, she should go directly to the emergency department for further evaluation and management.    Endometrial cancer [facility]) Patient received her first carpal plan/Taxol chemotherapy regimen on 06/26/2015.  Patient states that she has had some achiness since her first cycle of chemotherapy; but it is greatly improved within the past few days.  She is scheduled to return for labs and a follow-up visit on 07/08/2015.  Diarrhea Patient states she's been having liquid diarrhea for the past 4 days.  She states that her stomach cramps right before a diarrhea episode.  She denies any recent fevers or chills.  Patient states she has tried no over-the-counter medications as of yet.  Abdomen soft and nontender on exam.  With bowel sounds positive.  Have asked for a stool sample to rule out C. difficile.  Also, patient was advised to try Imodium over-the-counter as directed. _________________________________  Update: C diff stool was negative.   Dehydration Patient has had decreased appetite and poor oral intake.  She's also had frequent diarrhea for the past 4 days.  She appears dehydrated.  Will receive IV fluid rehydration on the cancer Center.    Patient stated understanding of all instructions; and was in agreement with this plan of care. The patient knows to call the clinic with any problems, questions or concerns.   Total time  spent with patient was 40 minutes;  with greater than 75 percent of that time spent in face to face counseling regarding patient's symptoms,  and coordination of care and follow up.  Disclaimer:This dictation was prepared with Dragon/digital dictation along with [employer]. Any transcriptional errors that result from this  process are unintentional.  Drue Second, NP 07/02/2015

## 2015-07-02 NOTE — Assessment & Plan Note (Addendum)
Patient states she's been having liquid diarrhea for the past 4 days.  She states that her stomach cramps right before a diarrhea episode.  She denies any recent fevers or chills.  Patient states she has tried no over-the-counter medications as of yet.  Abdomen soft and nontender on exam.  With bowel sounds positive.  Have asked for a stool sample to rule out C. difficile.  Also, patient was advised to try Imodium over-the-counter as directed. _________________________________  Update: C diff stool was negative.

## 2015-07-02 NOTE — Assessment & Plan Note (Signed)
Patient received her first carpal plan/Taxol chemotherapy regimen on 06/26/2015.  Patient states that she has had some achiness since her first cycle of chemotherapy; but it is greatly improved within the past few days.  She is scheduled to return for labs and a follow-up visit on 07/08/2015.

## 2015-07-02 NOTE — Patient Instructions (Signed)

## 2015-07-02 NOTE — Telephone Encounter (Signed)
smc apt ok per cyndee, cannot sched a lab apt that is passed.Marland Kitchen Apt sched next available.. Pt is already here.

## 2015-07-03 ENCOUNTER — Encounter: Payer: Self-pay | Admitting: Oncology

## 2015-07-03 ENCOUNTER — Telehealth: Payer: Self-pay | Admitting: *Deleted

## 2015-07-03 NOTE — Telephone Encounter (Signed)
LM for rtn call. Checking status of pt since [facility] visit.

## 2015-07-03 NOTE — Progress Notes (Signed)
form left in pod 07/02/15- left for dr. Marko Plume to sign

## 2015-07-07 ENCOUNTER — Other Ambulatory Visit: Payer: Self-pay | Admitting: Oncology

## 2015-07-08 ENCOUNTER — Encounter: Payer: Self-pay | Admitting: Oncology

## 2015-07-08 ENCOUNTER — Ambulatory Visit (HOSPITAL_BASED_OUTPATIENT_CLINIC_OR_DEPARTMENT_OTHER): Payer: BLUE CROSS/BLUE SHIELD | Admitting: Oncology

## 2015-07-08 ENCOUNTER — Telehealth: Payer: Self-pay | Admitting: *Deleted

## 2015-07-08 ENCOUNTER — Other Ambulatory Visit (HOSPITAL_BASED_OUTPATIENT_CLINIC_OR_DEPARTMENT_OTHER): Payer: BLUE CROSS/BLUE SHIELD

## 2015-07-08 VITALS — BP 135/74 | HR 86 | Temp 99.3°F | Resp 18 | Ht 59.0 in | Wt 178.2 lb

## 2015-07-08 DIAGNOSIS — D509 Iron deficiency anemia, unspecified: Secondary | ICD-10-CM

## 2015-07-08 DIAGNOSIS — E119 Type 2 diabetes mellitus without complications: Secondary | ICD-10-CM

## 2015-07-08 DIAGNOSIS — N2889 Other specified disorders of kidney and ureter: Secondary | ICD-10-CM

## 2015-07-08 DIAGNOSIS — I878 Other specified disorders of veins: Secondary | ICD-10-CM

## 2015-07-08 DIAGNOSIS — D701 Agranulocytosis secondary to cancer chemotherapy: Secondary | ICD-10-CM

## 2015-07-08 DIAGNOSIS — K8020 Calculus of gallbladder without cholecystitis without obstruction: Secondary | ICD-10-CM | POA: Diagnosis not present

## 2015-07-08 DIAGNOSIS — D500 Iron deficiency anemia secondary to blood loss (chronic): Secondary | ICD-10-CM

## 2015-07-08 DIAGNOSIS — R21 Rash and other nonspecific skin eruption: Secondary | ICD-10-CM

## 2015-07-08 DIAGNOSIS — E669 Obesity, unspecified: Secondary | ICD-10-CM

## 2015-07-08 DIAGNOSIS — C541 Malignant neoplasm of endometrium: Secondary | ICD-10-CM | POA: Diagnosis not present

## 2015-07-08 DIAGNOSIS — T451X5A Adverse effect of antineoplastic and immunosuppressive drugs, initial encounter: Secondary | ICD-10-CM

## 2015-07-08 DIAGNOSIS — Z6836 Body mass index (BMI) 36.0-36.9, adult: Secondary | ICD-10-CM

## 2015-07-08 LAB — COMPREHENSIVE METABOLIC PANEL
ALBUMIN: 3.7 g/dL (ref 3.5–5.0)
ALK PHOS: 111 U/L (ref 40–150)
ALT: 30 U/L (ref 0–55)
AST: 23 U/L (ref 5–34)
Anion Gap: 13 mEq/L — ABNORMAL HIGH (ref 3–11)
BILIRUBIN TOTAL: 0.41 mg/dL (ref 0.20–1.20)
BUN: 11.3 mg/dL (ref 7.0–26.0)
CALCIUM: 9.8 mg/dL (ref 8.4–10.4)
CO2: 26 mEq/L (ref 22–29)
Chloride: 101 mEq/L (ref 98–109)
Creatinine: 0.7 mg/dL (ref 0.6–1.1)
GLUCOSE: 108 mg/dL (ref 70–140)
Potassium: 4.3 mEq/L (ref 3.5–5.1)
SODIUM: 140 meq/L (ref 136–145)
TOTAL PROTEIN: 7.7 g/dL (ref 6.4–8.3)

## 2015-07-08 LAB — CBC WITH DIFFERENTIAL/PLATELET
BASO%: 1.9 % (ref 0.0–2.0)
BASOS ABS: 0 10*3/uL (ref 0.0–0.1)
EOS ABS: 0.1 10*3/uL (ref 0.0–0.5)
EOS%: 5.4 % (ref 0.0–7.0)
HCT: 36.8 % (ref 34.8–46.6)
HEMOGLOBIN: 11.8 g/dL (ref 11.6–15.9)
LYMPH%: 53.8 % — ABNORMAL HIGH (ref 14.0–49.7)
MCH: 26.9 pg (ref 25.1–34.0)
MCHC: 32.2 g/dL (ref 31.5–36.0)
MCV: 83.6 fL (ref 79.5–101.0)
MONO#: 0.3 10*3/uL (ref 0.1–0.9)
MONO%: 15.5 % — AB (ref 0.0–14.0)
NEUT%: 23.4 % — ABNORMAL LOW (ref 38.4–76.8)
NEUTROS ABS: 0.4 10*3/uL — AB (ref 1.5–6.5)
PLATELETS: 160 10*3/uL (ref 145–400)
RBC: 4.4 10*6/uL (ref 3.70–5.45)
RDW: 14.7 % — AB (ref 11.2–14.5)
WBC: 1.9 10*3/uL — AB (ref 3.9–10.3)
lymph#: 1 10*3/uL (ref 0.9–3.3)

## 2015-07-08 LAB — URINALYSIS, MICROSCOPIC - CHCC
BILIRUBIN (URINE): NEGATIVE
BLOOD: NEGATIVE
GLUCOSE UR CHCC: NEGATIVE mg/dL
KETONES: NEGATIVE mg/dL
Nitrite: NEGATIVE
PH: 6 (ref 4.6–8.0)
Protein: NEGATIVE mg/dL
RBC / HPF: NEGATIVE (ref 0–2)
Specific Gravity, Urine: 1.02 (ref 1.003–1.035)
Urobilinogen, UR: 0.2 mg/dL (ref 0.2–1)

## 2015-07-08 MED ORDER — TBO-FILGRASTIM 480 MCG/0.8ML ~~LOC~~ SOSY
480.0000 ug | PREFILLED_SYRINGE | Freq: Once | SUBCUTANEOUS | Status: AC
  Administered 2015-07-08: 480 ug via SUBCUTANEOUS
  Filled 2015-07-08: qty 0.8

## 2015-07-08 NOTE — Progress Notes (Signed)
OFFICE PROGRESS NOTE   July 10, 2015   Physicians: Terrence Dupont Rossi/ Janie Morning, Alver Sorrow, MD/ [facility], [name], Arvella Nigh, Dorna Leitz, _Grote  INTERVAL HISTORY:  Patient is seen in continuing attention to IIIC1 grade 2 endometrioid uterine carcinoma, with adjuvant carbo taxol chemotherapy begun 06-26-15, this planned in sandwich fashion with radiation. She is neutropenic today, ANC 0.4 (day 13 cycle 1), but not febrile, and gCSF will be started.  Patient had aches beginning day 3 (did not use claritin, ibuprofen helpful) and some GERD symptoms. She had pruritic rash upper posterior thighs and right scapula area beginning day 6, improved with interventions by symptom management clinic on day 7 including benadryl. Rash is better but not completely resolved now, patient still using some benadryl tho this makes her drowsy. She has rash to shrimp, no other known allergies. Timing of rash and pattern / symptoms do NOT suggest chemo as cause.  She has had no nausea. She has had some loose stools, used imodium yesterday, no loose stool since then. (C diff negative 6-20). She has worked regular schedule. She denies bleeding, fever or chills,, any symptoms of infection, SOB, LE swelling.  Peripheral IV access difficult, will need PAC placed for further chemo, after counts recover.   Remainder of 10 point Review of Systems negative  ONCOLOGIC HISTORY Patient had ~ 6 months of post menopausal bleeding, at times heavy, prior to reestablishing with Dr Arvella Nigh. [place] 02-12-15 showed slightly enlarged uterus with multiple intramural fibroids up to 3.5 cm, endometrial thickness 4 mm, polypoid lesion in cavity, ovaries grossly normal. She had hysteroscopic resection of endometrial polyp, with grade 2 endometrial adenocarcinoma. She was seen by gyn oncology on 03-29-15, with uterus large without other findings of concern. She had robotic laparoscopic hysterectomy BSO with  bilateral sentinel node sampling and lysis of adhesions by Dr Skeet Latch on 05-02-15. Pathology [phone]) grade 2 endometrial adenocarcinoma with 90% invasion of myometrium (1.4 cm out of 1.5 cm), no involvement cervix or adnexa, + focal LVSI, 2 benign right sentinel nodes and no left pelvic nodes in specimen; MMR normal. Surgery was uncomplicated, patient DC on post op day 1; she has recovered well from surgery, returning to work after 3 weeks. . She had CT AP 05-24-15 with mild left common iliac adenopathy up to 1.1 cm as well as 0.7 cm low aortocaval node, and a 1.6 x 1.6 cm renal mass posterior lower left kidney indeterminate. PET 06-12-15 had SUV 4.1 at the 10 mm left common iliac node, other nodes too small for PET evaluation; mildly hyperdense lesion lower pole left kidney 1.8 cm. She saw Dr Sondra Come in consultation on 06-06-15. Recommendation from surgical pathology and imaging is for chemotherapy and radiation in sandwich fashion.  First carboplatin taxol given 06-26-15, neutropenic with ANC 0.4 on day 13 cycle 1.    Objective:  Vital signs in last 24 hours: Weight down 3 lbs to 178. 135/74, 86 regular, 20 not labored 98.2  Alert, oriented and appropriate. Ambulatory without difficulty.  No alopecia  HEENT:PERRL, sclerae not icteric. Oral mucosa moist without lesions, posterior pharynx clear.  Neck supple. No JVD.  Lymphatics:no cervical,supraclavicular,or inguinal adenopathy Resp: clear to auscultation bilaterally and normal percussion bilaterally Cardio: regular rate and rhythm. No gallop. GI: soft, nontender, not distended, no mass or organomegaly. Normally active bowel sounds. Surgical incisions not remarkable. Musculoskeletal/ Extremities: without pitting edema, cords, tenderness Neuro: no peripheral neuropathy. Otherwise nonfocal. PSYCH appropriate mood and affect Skin  rash is tiny vesicles < 1 mm grouped on right lateral scapula slightly erythematous, , total area ~ 4 cm, and similar  more diffusely scattered upper posterior thighs. Not dermatomal, does not look herpetic, does not look like drug reaction. Ecchymoses bilateral forearms at attempted IV access sites.    Lab Results:  Results for orders placed or performed in visit on 07/08/15  CBC with Differential  Result Value Ref Range   WBC 1.9 (L) 3.9 - 10.3 10e3/uL   NEUT# 0.4 (LL) 1.5 - 6.5 10e3/uL   HGB 11.8 11.6 - 15.9 g/dL   HCT 36.8 34.8 - 46.6 %   Platelets 160 145 - 400 10e3/uL   MCV 83.6 79.5 - 101.0 fL   MCH 26.9 25.1 - 34.0 pg   MCHC 32.2 31.5 - 36.0 g/dL   RBC 4.40 3.70 - 5.45 10e6/uL   RDW 14.7 (H) 11.2 - 14.5 %   lymph# 1.0 0.9 - 3.3 10e3/uL   MONO# 0.3 0.1 - 0.9 10e3/uL   Eosinophils Absolute 0.1 0.0 - 0.5 10e3/uL   Basophils Absolute 0.0 0.0 - 0.1 10e3/uL   NEUT% 23.4 (L) 38.4 - 76.8 %   LYMPH% 53.8 (H) 14.0 - 49.7 %   MONO% 15.5 (H) 0.0 - 14.0 %   EOS% 5.4 0.0 - 7.0 %   BASO% 1.9 0.0 - 2.0 %  Comprehensive metabolic panel  Result Value Ref Range   Sodium 140 136 - 145 mEq/L   Potassium 4.3 3.5 - 5.1 mEq/L   Chloride 101 98 - 109 mEq/L   CO2 26 22 - 29 mEq/L   Glucose 108 70 - 140 mg/dl   BUN 11.3 7.0 - 26.0 mg/dL   Creatinine 0.7 0.6 - 1.1 mg/dL   Total Bilirubin 0.41 0.20 - 1.20 mg/dL   Alkaline Phosphatase 111 40 - 150 U/L   AST 23 5 - 34 U/L   ALT 30 0 - 55 U/L   Total Protein 7.7 6.4 - 8.3 g/dL   Albumin 3.7 3.5 - 5.0 g/dL   Calcium 9.8 8.4 - 10.4 mg/dL   Anion Gap 13 (H) 3 - 11 mEq/L   EGFR >90 >90 ml/min/1.73 m2  Urinalysis with microscopic  Result Value Ref Range   Glucose Negative Negative mg/dL   Bilirubin (Urine) Negative Negative   Ketones Negative Negative mg/dL   Specific Gravity, Urine 1.020 1.003 - 1.035   Blood Negative Negative   pH 6.0 4.6 - 8.0   Protein Negative Negative- <30 mg/dL   Urobilinogen, UR 0.2 0.2 - 1 mg/dL   Nitrite Negative Negative   Leukocyte Esterase Trace Negative   RBC / HPF Negative 0 - 2   WBC, UA 0-2 0 - 2   Bacteria, UA Trace  Negative- Trace   Epithelial Cells Occasional Negative- Few     Studies/Results:  No results found.  Medications: I have reviewed the patient's current medications. She should start claritin now, as this may help rash and cause less sedation than benadryl. Granix 480 today and 6-27, then will recheck CBC on 6-28 to see if additional granix needed.   DISCUSSION Neutropenic precautions discussed, written information also given. She will get thermometer and claritin today. Discussed use of gCSF to improve WBC, possible aches also with gCSF.   Assessment/Plan:  1.IIIC1 grade 2 endometrioid endometrial carcinoma: post robotic assisted laparoscopic hysterectomy BSO and right pelvic sentinel nodes, with invasion 90% of myometrium and CT/PET evidence of left common iliac node involvement. Plan total 6  cycles of carboplatin taxol chemotherapy in sandwich fashion with RT. Cycle 1 given 06-26-15, chemo neutropenia now, plan as above. I will see her with lab 7-3 prior to cycle 2 ~ 7-5 2.Iron deficiency related to 6+ months of vaginal bleeding: oral iron if tolerates, as ferrous fumarate 3.questionable mass left kidney and right nephrolithiasis: check UA. May need urology input for left kidney probably after present treatment completes 4.hypothyroidism on medication, monitored by primary care 5.obesity, BMI 36. Exercise limited by chronic problems knees 6. Cholelithiasis, not presently symptomatic 7.diverticulitis and hemorrhoids by colonoscopy 2007. She is due repeat colonoscopy, likely best to wait on this also until present treatment completed 8.minimal past tobacco 9.HTN, elevated lipids. Good EF 2012 10.EMR mention of OSA 11.inadequate peripheral IV access: will need PAC by IR after counts recover. May need to adjust day of #2 chemo depending on placement. Will need EMLA 12. Skin rash unclear etiology: looks possibly contact allergy, timing and pattern do not suggest drug reaction/ chemo. Add  claritin.  All questions answered and she knows to call if concerns prior to next scheduled visit. Granix orders entered. Letter written for her employer as she should not be at work while neutropenic. Confirmed with managed care that her FMLA papers will be faxed back to employer today. Time spent 30 min including >50% counseling and coordination of care. Route PCP  Gordy Levan, MD   07/10/2015, 1:05 PM

## 2015-07-08 NOTE — Progress Notes (Signed)
Introduced myself as her FA.  Informed her unfortunately there aren't any foundations offering copay assistance for her Dx.  Asked if she needed assistance w/ personal bills or transportation while going thru treatment.  She said she works so she thinks she's good for now.  I gave her my card in case anything changes & would like to apply for the [employer] grants.

## 2015-07-08 NOTE — Telephone Encounter (Signed)
Called employer and let him know that Lydia Walsh is unable to work due to low white count from treatment. Cannot be around people for 2 days.  Written note given to patient to take to work when she returns

## 2015-07-08 NOTE — Patient Instructions (Addendum)
Begin Claritin (lortadine) 10 mg once daily. This should help the itchy rash and also may help aches from chemo and aches from granix shots. Claritin will not make you sleepy. You do not need benadryl if you take claritin.  You need to get a thermometer  Your white blood cells are low today 07-08-15, so you are more susceptible to infection. We will give granix shots next 2-3 days until the blood counts are better.  While your blood counts are low you need to stay out of work and do not shop or go into restaurants. Better to use drive thru window at pharmacy if possible. If you have to go into pharmacy, wear mask and wash hands. Check your temperature at home 2-3 times daily while blood counts are low. Call if temperature is >=100.5 or if symptoms of infection  [phone].

## 2015-07-08 NOTE — Progress Notes (Signed)
form left in pod 07/02/15- left for dr. Marko Plume to sign- copy given to patient

## 2015-07-09 ENCOUNTER — Ambulatory Visit (HOSPITAL_BASED_OUTPATIENT_CLINIC_OR_DEPARTMENT_OTHER): Payer: BLUE CROSS/BLUE SHIELD

## 2015-07-09 VITALS — BP 148/73 | HR 87 | Temp 98.2°F | Resp 20

## 2015-07-09 DIAGNOSIS — C541 Malignant neoplasm of endometrium: Secondary | ICD-10-CM

## 2015-07-09 DIAGNOSIS — Z5189 Encounter for other specified aftercare: Secondary | ICD-10-CM | POA: Diagnosis not present

## 2015-07-09 MED ORDER — TBO-FILGRASTIM 480 MCG/0.8ML ~~LOC~~ SOSY
480.0000 ug | PREFILLED_SYRINGE | Freq: Once | SUBCUTANEOUS | Status: AC
  Administered 2015-07-09: 480 ug via SUBCUTANEOUS
  Filled 2015-07-09: qty 0.8

## 2015-07-09 NOTE — Patient Instructions (Signed)

## 2015-07-10 ENCOUNTER — Telehealth: Payer: Self-pay | Admitting: Oncology

## 2015-07-10 ENCOUNTER — Ambulatory Visit (HOSPITAL_BASED_OUTPATIENT_CLINIC_OR_DEPARTMENT_OTHER): Payer: BLUE CROSS/BLUE SHIELD

## 2015-07-10 ENCOUNTER — Telehealth: Payer: Self-pay

## 2015-07-10 ENCOUNTER — Other Ambulatory Visit (HOSPITAL_BASED_OUTPATIENT_CLINIC_OR_DEPARTMENT_OTHER): Payer: BLUE CROSS/BLUE SHIELD

## 2015-07-10 ENCOUNTER — Other Ambulatory Visit: Payer: Self-pay | Admitting: Oncology

## 2015-07-10 VITALS — BP 160/75 | HR 100 | Temp 98.3°F | Resp 20

## 2015-07-10 DIAGNOSIS — T451X5A Adverse effect of antineoplastic and immunosuppressive drugs, initial encounter: Secondary | ICD-10-CM

## 2015-07-10 DIAGNOSIS — C541 Malignant neoplasm of endometrium: Secondary | ICD-10-CM | POA: Diagnosis not present

## 2015-07-10 DIAGNOSIS — D701 Agranulocytosis secondary to cancer chemotherapy: Secondary | ICD-10-CM | POA: Insufficient documentation

## 2015-07-10 DIAGNOSIS — I878 Other specified disorders of veins: Secondary | ICD-10-CM | POA: Insufficient documentation

## 2015-07-10 DIAGNOSIS — D509 Iron deficiency anemia, unspecified: Secondary | ICD-10-CM | POA: Insufficient documentation

## 2015-07-10 HISTORY — DX: Malignant neoplasm of endometrium: C54.1

## 2015-07-10 HISTORY — DX: Adverse effect of antineoplastic and immunosuppressive drugs, initial encounter: T45.1X5A

## 2015-07-10 LAB — CBC WITH DIFFERENTIAL/PLATELET
BASO%: 0.5 % (ref 0.0–2.0)
Basophils Absolute: 0.1 10*3/uL (ref 0.0–0.1)
EOS ABS: 0.1 10*3/uL (ref 0.0–0.5)
EOS%: 1.1 % (ref 0.0–7.0)
HCT: 39.3 % (ref 34.8–46.6)
HEMOGLOBIN: 12.3 g/dL (ref 11.6–15.9)
LYMPH%: 9.7 % — ABNORMAL LOW (ref 14.0–49.7)
MCH: 26.5 pg (ref 25.1–34.0)
MCHC: 31.2 g/dL — AB (ref 31.5–36.0)
MCV: 84.7 fL (ref 79.5–101.0)
MONO#: 0.7 10*3/uL (ref 0.1–0.9)
MONO%: 5.2 % (ref 0.0–14.0)
NEUT%: 83.5 % — ABNORMAL HIGH (ref 38.4–76.8)
NEUTROS ABS: 10.9 10*3/uL — AB (ref 1.5–6.5)
Platelets: 157 10*3/uL (ref 145–400)
RBC: 4.64 10*6/uL (ref 3.70–5.45)
RDW: 15.1 % — AB (ref 11.2–14.5)
WBC: 13.1 10*3/uL — AB (ref 3.9–10.3)
lymph#: 1.3 10*3/uL (ref 0.9–3.3)

## 2015-07-10 LAB — COMPREHENSIVE METABOLIC PANEL
ALT: 25 U/L (ref 0–55)
AST: 20 U/L (ref 5–34)
Albumin: 3.6 g/dL (ref 3.5–5.0)
Alkaline Phosphatase: 141 U/L (ref 40–150)
Anion Gap: 12 mEq/L — ABNORMAL HIGH (ref 3–11)
BUN: 8.1 mg/dL (ref 7.0–26.0)
CHLORIDE: 104 meq/L (ref 98–109)
CO2: 27 meq/L (ref 22–29)
CREATININE: 0.7 mg/dL (ref 0.6–1.1)
Calcium: 9.6 mg/dL (ref 8.4–10.4)
EGFR: >90 mL/min/{1.73_m2} (ref >90)
Glucose: 139 mg/dl (ref 70–140)
Potassium: 3.3 mEq/L — ABNORMAL LOW (ref 3.5–5.1)
Sodium: 142 mEq/L (ref 136–145)
Total Bilirubin: 0.41 mg/dL (ref 0.20–1.20)
Total Protein: 7.3 g/dL (ref 6.4–8.3)

## 2015-07-10 MED ORDER — TBO-FILGRASTIM 480 MCG/0.8ML ~~LOC~~ SOSY
480.0000 ug | PREFILLED_SYRINGE | Freq: Once | SUBCUTANEOUS | Status: AC
  Administered 2015-07-10: 480 ug via SUBCUTANEOUS
  Filled 2015-07-10: qty 0.8

## 2015-07-10 MED ORDER — LIDOCAINE-PRILOCAINE 2.5-2.5 % EX CREA: TOPICAL_CREAM | CUTANEOUS | Status: DC

## 2015-07-10 NOTE — Telephone Encounter (Signed)
-----   Message from Gordy Levan, MD sent at 07/10/2015 12:59 PM EDT ----- Needs PAC Next chemo 7-5  I have put in order for [facility] by IR now. May need to move chemo if can't get it done in time Please be sure patient understands about PAC. Needs EMLA.  thanks

## 2015-07-10 NOTE — Telephone Encounter (Signed)
Move her to 1230 on 7-3 ok thanks

## 2015-07-10 NOTE — Patient Instructions (Signed)

## 2015-07-10 NOTE — Telephone Encounter (Signed)
-----   Message from Gordy Levan, MD sent at 07/10/2015 12:59 PM EDT ----- Needs PAC Next chemo 7-5  I have put in order for [facility] by IR now. May need to move chemo if can't get it done in time Please be sure patient understands about PAC. Needs EMLA.  thanks

## 2015-07-10 NOTE — Telephone Encounter (Addendum)
lvm for pt to call back. Pt received granix today before labs were resulted. Her ANC is 10.9. She may experience more pain b/c of this. Pt to use analgesics prn. Also we will be setting her up to get a PAC and EMLA cream e-scribed

## 2015-07-10 NOTE — Telephone Encounter (Signed)
Pt called about work schedule, she is off work d/t neutropenia. Wrote rx for pt to return to work 6/29 - 7/2. Pt will pick up Rx at front desk.  S/w pt about increased body aches from granix shot and using analgesics. S/w pt about EMLA cream and how to apply it.  IR scheduled port placement on 7/3 at 0930. This conflicts with Dr Marko Plume appt. IR will draw labs at time of placement. Will contact Dr Marko Plume about r/s of MD appt. Pt chemo appt on 7/5

## 2015-07-10 NOTE — Telephone Encounter (Signed)
left msg confirming 7/3 apt times

## 2015-07-10 NOTE — Telephone Encounter (Signed)
POF sent per Dr Edwyna Shell attached message

## 2015-07-11 ENCOUNTER — Other Ambulatory Visit: Payer: Self-pay | Admitting: Radiology

## 2015-07-11 ENCOUNTER — Telehealth: Payer: Self-pay | Admitting: *Deleted

## 2015-07-11 NOTE — Telephone Encounter (Signed)
"  I thought you don't loose hair until the second chemotherapy.  I've started loosing hair since Sunday and my first chemotherapy was 06-26-2015.  I call wig boutique and no one answers.  What about your eyebrows, eyelashes and other hair?  Do they help with the eyebrows and eye lashes in the classes about make up?"  This nurse called [phone], "wig boutique in transition.  manager of volunteers, Leslye Peer will call patient to arrange use of wig voucher.  Return call out to patient needed to provide support group class information.  Patient return number [phone].  Suggested hats or bonnets if obtaining wig is delayed.  No further questions.

## 2015-07-11 NOTE — Telephone Encounter (Signed)
Called patient with "Look Good Feel Better"  Class information.  A free cosmetic kit is provided.  Call the [employer] to register and obtain more information.  The Eyebrow stamp may not be included in this kit but is okay for patient use.  Like a Henna Tattoo for eyebrows.     

## 2015-07-12 ENCOUNTER — Other Ambulatory Visit: Payer: Self-pay | Admitting: General Surgery

## 2015-07-14 ENCOUNTER — Other Ambulatory Visit: Payer: Self-pay | Admitting: Oncology

## 2015-07-15 ENCOUNTER — Other Ambulatory Visit: Payer: BLUE CROSS/BLUE SHIELD

## 2015-07-15 ENCOUNTER — Telehealth: Payer: Self-pay | Admitting: Oncology

## 2015-07-15 ENCOUNTER — Encounter: Payer: Self-pay | Admitting: Oncology

## 2015-07-15 ENCOUNTER — Ambulatory Visit (HOSPITAL_COMMUNITY)
Admission: RE | Admit: 2015-07-15 | Discharge: 2015-07-15 | Disposition: A | Discharge status: Home / Self Care | Payer: BLUE CROSS/BLUE SHIELD | Source: Ambulatory Visit | Attending: Oncology | Admitting: Oncology

## 2015-07-15 ENCOUNTER — Other Ambulatory Visit: Payer: Self-pay | Admitting: Oncology

## 2015-07-15 ENCOUNTER — Ambulatory Visit (HOSPITAL_BASED_OUTPATIENT_CLINIC_OR_DEPARTMENT_OTHER): Payer: BLUE CROSS/BLUE SHIELD | Admitting: Oncology

## 2015-07-15 ENCOUNTER — Encounter (HOSPITAL_COMMUNITY): Payer: Self-pay

## 2015-07-15 ENCOUNTER — Ambulatory Visit: Payer: BLUE CROSS/BLUE SHIELD | Admitting: Oncology

## 2015-07-15 VITALS — BP 129/72 | HR 90 | Temp 97.7°F | Resp 18 | Ht 59.0 in | Wt 178.0 lb

## 2015-07-15 DIAGNOSIS — I10 Essential (primary) hypertension: Secondary | ICD-10-CM | POA: Diagnosis not present

## 2015-07-15 DIAGNOSIS — Z888 Allergy status to other drugs, medicaments and biological substances status: Secondary | ICD-10-CM | POA: Diagnosis not present

## 2015-07-15 DIAGNOSIS — C541 Malignant neoplasm of endometrium: Secondary | ICD-10-CM

## 2015-07-15 DIAGNOSIS — Z8249 Family history of ischemic heart disease and other diseases of the circulatory system: Secondary | ICD-10-CM | POA: Diagnosis not present

## 2015-07-15 DIAGNOSIS — E039 Hypothyroidism, unspecified: Secondary | ICD-10-CM | POA: Insufficient documentation

## 2015-07-15 DIAGNOSIS — D500 Iron deficiency anemia secondary to blood loss (chronic): Secondary | ICD-10-CM | POA: Diagnosis not present

## 2015-07-15 DIAGNOSIS — Z90710 Acquired absence of both cervix and uterus: Secondary | ICD-10-CM | POA: Insufficient documentation

## 2015-07-15 DIAGNOSIS — K219 Gastro-esophageal reflux disease without esophagitis: Secondary | ICD-10-CM | POA: Insufficient documentation

## 2015-07-15 DIAGNOSIS — E785 Hyperlipidemia, unspecified: Secondary | ICD-10-CM | POA: Diagnosis not present

## 2015-07-15 DIAGNOSIS — K649 Unspecified hemorrhoids: Secondary | ICD-10-CM

## 2015-07-15 DIAGNOSIS — Z90722 Acquired absence of ovaries, bilateral: Secondary | ICD-10-CM | POA: Insufficient documentation

## 2015-07-15 DIAGNOSIS — Z87891 Personal history of nicotine dependence: Secondary | ICD-10-CM

## 2015-07-15 DIAGNOSIS — K5792 Diverticulitis of intestine, part unspecified, without perforation or abscess without bleeding: Secondary | ICD-10-CM | POA: Diagnosis not present

## 2015-07-15 DIAGNOSIS — N200 Calculus of kidney: Secondary | ICD-10-CM | POA: Diagnosis not present

## 2015-07-15 DIAGNOSIS — G4733 Obstructive sleep apnea (adult) (pediatric): Secondary | ICD-10-CM | POA: Insufficient documentation

## 2015-07-15 DIAGNOSIS — Z79899 Other long term (current) drug therapy: Secondary | ICD-10-CM | POA: Diagnosis not present

## 2015-07-15 DIAGNOSIS — Z95828 Presence of other vascular implants and grafts: Secondary | ICD-10-CM

## 2015-07-15 DIAGNOSIS — E669 Obesity, unspecified: Secondary | ICD-10-CM

## 2015-07-15 DIAGNOSIS — Z452 Encounter for adjustment and management of vascular access device: Secondary | ICD-10-CM | POA: Diagnosis not present

## 2015-07-15 DIAGNOSIS — M1990 Unspecified osteoarthritis, unspecified site: Secondary | ICD-10-CM | POA: Insufficient documentation

## 2015-07-15 DIAGNOSIS — T451X5A Adverse effect of antineoplastic and immunosuppressive drugs, initial encounter: Secondary | ICD-10-CM

## 2015-07-15 DIAGNOSIS — D701 Agranulocytosis secondary to cancer chemotherapy: Secondary | ICD-10-CM

## 2015-07-15 LAB — CBC WITH DIFFERENTIAL/PLATELET
BASOS PCT: 1 %
Basophils Absolute: 0.1 10*3/uL (ref 0.0–0.1)
Eosinophils Absolute: 0.1 10*3/uL (ref 0.0–0.7)
Eosinophils Relative: 1 %
HEMATOCRIT: 36.9 % (ref 36.0–46.0)
HEMOGLOBIN: 12.3 g/dL (ref 12.0–15.0)
LYMPHS ABS: 1.6 10*3/uL (ref 0.7–4.0)
Lymphocytes Relative: 23 %
MCH: 27.3 pg (ref 26.0–34.0)
MCHC: 33.3 g/dL (ref 30.0–36.0)
MCV: 81.8 fL (ref 78.0–100.0)
MONO ABS: 0.6 10*3/uL (ref 0.1–1.0)
MONOS PCT: 9 %
NEUTROS ABS: 4.4 10*3/uL (ref 1.7–7.7)
Neutrophils Relative %: 66 %
Platelets: 144 10*3/uL — ABNORMAL LOW (ref 150–400)
RBC: 4.51 MIL/uL (ref 3.87–5.11)
RDW: 14.8 % (ref 11.5–15.5)
WBC: 6.8 10*3/uL (ref 4.0–10.5)

## 2015-07-15 LAB — COMPREHENSIVE METABOLIC PANEL
ALBUMIN: 4.4 g/dL (ref 3.5–5.0)
ALK PHOS: 131 U/L — AB (ref 38–126)
ALT: 28 U/L (ref 14–54)
ANION GAP: 9 (ref 5–15)
AST: 25 U/L (ref 15–41)
BILIRUBIN TOTAL: 0.7 mg/dL (ref 0.3–1.2)
BUN: 15 mg/dL (ref 6–20)
CALCIUM: 9.4 mg/dL (ref 8.9–10.3)
CO2: 29 mmol/L (ref 22–32)
Chloride: 103 mmol/L (ref 101–111)
Creatinine, Ser: 0.62 mg/dL (ref 0.44–1.00)
GFR calc Af Amer: >60 mL/min (ref >60)
GLUCOSE: 114 mg/dL — AB (ref 65–99)
POTASSIUM: 4 mmol/L (ref 3.5–5.1)
Sodium: 141 mmol/L (ref 135–145)
TOTAL PROTEIN: 7.7 g/dL (ref 6.5–8.1)

## 2015-07-15 LAB — PROTIME-INR
INR: 1.03 (ref 0.00–1.49)
PROTHROMBIN TIME: 13.3 s (ref 11.6–15.2)

## 2015-07-15 MED ORDER — HEPARIN SOD (PORK) LOCK FLUSH 100 UNIT/ML IV SOLN
INTRAVENOUS | Status: AC
  Filled 2015-07-15: qty 5

## 2015-07-15 MED ORDER — SODIUM CHLORIDE 0.9 % IV SOLN
INTRAVENOUS | Status: DC
  Administered 2015-07-15: 08:00:00 via INTRAVENOUS

## 2015-07-15 MED ORDER — MIDAZOLAM HCL 2 MG/2ML IJ SOLN
INTRAMUSCULAR | Status: AC | PRN
  Administered 2015-07-15 (×3): 1 mg via INTRAVENOUS

## 2015-07-15 MED ORDER — MIDAZOLAM HCL 2 MG/2ML IJ SOLN
INTRAMUSCULAR | Status: AC
  Filled 2015-07-15: qty 6

## 2015-07-15 MED ORDER — CEFAZOLIN SODIUM-DEXTROSE 2-4 GM/100ML-% IV SOLN
2.0000 g | INTRAVENOUS | Status: AC
  Administered 2015-07-15: 2 g via INTRAVENOUS
  Filled 2015-07-15: qty 100

## 2015-07-15 MED ORDER — FENTANYL CITRATE (PF) 100 MCG/2ML IJ SOLN
INTRAMUSCULAR | Status: AC
  Filled 2015-07-15: qty 4

## 2015-07-15 MED ORDER — LIDOCAINE HCL 1 % IJ SOLN
INTRAMUSCULAR | Status: AC
  Filled 2015-07-15: qty 20

## 2015-07-15 MED ORDER — HEPARIN SOD (PORK) LOCK FLUSH 100 UNIT/ML IV SOLN
INTRAVENOUS | Status: AC | PRN
  Administered 2015-07-15: 500 [IU]

## 2015-07-15 MED ORDER — LIDOCAINE-PRILOCAINE 2.5-2.5 % EX CREA: TOPICAL_CREAM | CUTANEOUS | Status: DC

## 2015-07-15 MED ORDER — FENTANYL CITRATE (PF) 100 MCG/2ML IJ SOLN
INTRAMUSCULAR | Status: AC | PRN
  Administered 2015-07-15: 50 ug via INTRAVENOUS

## 2015-07-15 MED ORDER — DEXAMETHASONE 4 MG PO TABS: ORAL_TABLET | ORAL | Status: DC

## 2015-07-15 NOTE — Progress Notes (Signed)
OFFICE PROGRESS NOTE   July 15, 2015   Physicians: Terrence Dupont Rossi/ Janie Morning, Alver Sorrow, MD/ [facility], [name], Arvella Nigh, Dorna Leitz, _Grote  INTERVAL HISTORY:  Patient is seen, alone for visit, in continuing attention to adjuvant chemotherapy being given for IIIC1 grade 2 endometrioid uterine carcinoma. She had cycle 1 carbo taxol on 06-26-15, with neutropenia day 13 cycle 1 (ANC 0.4). Counts recovered with 2 doses of granix then.  She had PAC placed by IR this AM.  Patient does not seem to have had severe aches with the gCSF, despite a third dose of granix given after counts had recovered. The pruritic rash right shoulder and upper posterior thighs has almost completely resolved. She has started to lose hair, which has been upsetting to her, tho she is interested in going to wig boutique. Appetite has been good, no significant peripheral neuropathy, no bleeding, bowels ok, no swelling LE, no fever or symptoms of infection. She is sore at new PAC, but otherwise no problems from placement this AM. Remainder of 10 point Review of Systems negative / unchanged.     PAC placed 07-15-15  ONCOLOGIC HISTORY Patient had ~ 6 months of post menopausal bleeding, at times heavy, prior to reestablishing with Dr Arvella Nigh. [place] 02-12-15 showed slightly enlarged uterus with multiple intramural fibroids up to 3.5 cm, endometrial thickness 4 mm, polypoid lesion in cavity, ovaries grossly normal. She had hysteroscopic resection of endometrial polyp, with grade 2 endometrial adenocarcinoma. She was seen by gyn oncology on 03-29-15, with uterus large without other findings of concern. She had robotic laparoscopic hysterectomy BSO with bilateral sentinel node sampling and lysis of adhesions by Dr Skeet Latch on 05-02-15. Pathology [phone]) grade 2 endometrial adenocarcinoma with 90% invasion of myometrium (1.4 cm out of 1.5 cm), no involvement cervix or adnexa, + focal LVSI, 2  benign right sentinel nodes and no left pelvic nodes in specimen; MMR normal. Surgery was uncomplicated, patient DC on post op day 1; she has recovered well from surgery, returning to work after 3 weeks. . She had CT AP 05-24-15 with mild left common iliac adenopathy up to 1.1 cm as well as 0.7 cm low aortocaval node, and a 1.6 x 1.6 cm renal mass posterior lower left kidney indeterminate. PET 06-12-15 had SUV 4.1 at the 10 mm left common iliac node, other nodes too small for PET evaluation; mildly hyperdense lesion lower pole left kidney 1.8 cm. She saw Dr Sondra Come in consultation on 06-06-15. Recommendation from surgical pathology and imaging is for chemotherapy and radiation in sandwich fashion.  First carboplatin taxol given 06-26-15, neutropenic with ANC 0.4 on day 13 cycle 1, gCSF added.   Objective:  Vital signs in last 24 hours:  BP 129/72 mmHg  Pulse 90  Temp(Src) 97.7 F (36.5 C) (Oral)  Resp 18  Ht '4\' 11"'$  (1.499 m)  Wt 178 lb (80.74 kg)  BMI 35.93 kg/m2  SpO2 100%  LMP 07/16/2014 Weight stable Alert, oriented and appropriate. In [facility] since [facility] placement just prior to this visit.  Alopecia  HEENT:PERRL, sclerae not icteric. Oral mucosa moist without lesions, posterior pharynx clear.  Neck supple. No JVD.  Lymphatics:no cervical,supraclavicular adenopathy Resp: clear to auscultation bilaterally and normal percussion bilaterally Cardio: regular rate and rhythm. No gallop. GI: soft, nontender, not distended, no mass or organomegaly. Normally active bowel sounds. Musculoskeletal/ Extremities: without pitting edema, cords, tenderness Neuro: no peripheral neuropathy. Otherwise nonfocal Skin  Minimal rash in area ~ 1 cm  lower lateral right scapula. Otherwise without ecchymosis, petechiae Portacath dressing in place, no unusual surrounding bruising or swelling.    Lab Results:  Results for orders placed or performed during the hospital encounter of 07/15/15  CBC with  Differential/Platelet  Result Value Ref Range   WBC 6.8 4.0 - 10.5 K/uL   RBC 4.51 3.87 - 5.11 MIL/uL   Hemoglobin 12.3 12.0 - 15.0 g/dL   HCT 36.9 36.0 - 46.0 %   MCV 81.8 78.0 - 100.0 fL   MCH 27.3 26.0 - 34.0 pg   MCHC 33.3 30.0 - 36.0 g/dL   RDW 14.8 11.5 - 15.5 %   Platelets 144 (L) 150 - 400 K/uL   Neutrophils Relative % 66 %   Lymphocytes Relative 23 %   Monocytes Relative 9 %   Eosinophils Relative 1 %   Basophils Relative 1 %   Neutro Abs 4.4 1.7 - 7.7 K/uL   Lymphs Abs 1.6 0.7 - 4.0 K/uL   Monocytes Absolute 0.6 0.1 - 1.0 K/uL   Eosinophils Absolute 0.1 0.0 - 0.7 K/uL   Basophils Absolute 0.1 0.0 - 0.1 K/uL   WBC Morphology MILD LEFT SHIFT (1-5% METAS, OCC MYELO, OCC BANDS)    Smear Review LARGE PLATELETS PRESENT   Comprehensive metabolic panel  Result Value Ref Range   Sodium 141 135 - 145 mmol/L   Potassium 4.0 3.5 - 5.1 mmol/L   Chloride 103 101 - 111 mmol/L   CO2 29 22 - 32 mmol/L   Glucose, Bld 114 (H) 65 - 99 mg/dL   BUN 15 6 - 20 mg/dL   Creatinine, Ser 0.62 0.44 - 1.00 mg/dL   Calcium 9.4 8.9 - 10.3 mg/dL   Total Protein 7.7 6.5 - 8.1 g/dL   Albumin 4.4 3.5 - 5.0 g/dL   AST 25 15 - 41 U/L   ALT 28 14 - 54 U/L   Alkaline Phosphatase 131 (H) 38 - 126 U/L   Total Bilirubin 0.7 0.3 - 1.2 mg/dL   GFR calc non Af Amer >60 >60 mL/min   GFR calc Af Amer >60 >60 mL/min   Anion gap 9 5 - 15  Protime-INR  Result Value Ref Range   Prothrombin Time 13.3 11.6 - 15.2 seconds   INR 1.03 0.00 - 1.49     Studies/Results: IR procedure note for PAC reviewed.   Medications: I have reviewed the patient's current medications. I have given her oral and written instructions with times for premedication decadron and have asked her to start claritin day of chemo. She will need granix beginning day 3 x 3 days.   DISCUSSION Patient feels that she is better prepared to manage cycle 2 chemo, understands that she can call if any concerns. Reviewed plan for radiation after  cycle 3, then completion of last 3 cycles of chemo. Meds as above.  Letter written for employer for time off with upcoming chemo.    Assessment/Plan:     1.IIIC1 grade 2 endometrioid endometrial carcinoma: post robotic assisted laparoscopic hysterectomy BSO and right pelvic sentinel nodes, with invasion 90% of myometrium and CT/PET evidence of left common iliac node involvement. Plan total 6 cycles of carboplatin taxol chemotherapy in sandwich fashion with RT. Cycle 1 given 06-26-15, chemo neutropenia day 13. Counts good today for cycle 2 on 07-17-15, doses same and granix days 2-3-4 added. She will begin claritin day of chemo thru resolution of aches from taxol and granix. I will see her with labs 07-25-15.  2.Iron deficiency related to 6+ months of vaginal bleeding: oral iron as ferrous fumarate 3.questionable mass left kidney and right nephrolithiasis: UA negative for blood 07-08-15. May need urology input for left kidney after present treatment completes 4.hypothyroidism on medication, monitored by primary care 5.obesity, BMI 36. Exercise limited by chronic problems knees 6. Cholelithiasis, not presently symptomatic 7.diverticulitis and hemorrhoids by colonoscopy 2007. She is due repeat colonoscopy, likely best to wait on this also until present treatment completed 8.minimal past tobacco 9.HTN, elevated lipids. Good EF 2012 10.EMR mention of OSA 11.PAC placed by IR 12. Skin rash unclear etiology: timing and pattern did not suggest drug reaction/ chemo. Nearly resolved, no longer pruritic. Claritin may help ongoing also.  All questions answered. Chemo and granix orders placed/ confirmed. Time spent 25 min including >50% counseling and coordination of care. Route PCP.   Loranzo Desha P, MD   07/15/2015, 2:16 PM

## 2015-07-15 NOTE — Procedures (Signed)
Interventional Radiology Procedure Note  Procedure: Placement of a right IJ approach single lumen PowerPort.  Tip is positioned at the superior cavoatrial junction and catheter is ready for immediate use.  Complications: No immediate Recommendations:  - Ok to shower tomorrow - Do not submerge for 7 days - Routine line care   Signed,  Natsumi Whitsitt S. Jinan Biggins, DO    

## 2015-07-15 NOTE — Telephone Encounter (Signed)
Gave and printed appt sched and avs for pt for July  °

## 2015-07-15 NOTE — Consult Note (Signed)
Chief Complaint: Patient was seen in consultation today for port a cath placement  Referring Physician(s): Livesay,Lennis P  Supervising Physician: Corrie Mckusick  Patient Status: Outpatient  History of Present Illness: Lydia Walsh is a 60 y.o. female with history of endometrioid endometrial carcinoma and prior laparoscopic hysterectomy/BSO April 2017. She has poor venous access and request now received for Port-A-Cath placement for additional chemotherapy. Additional history as listed below.  Past Medical History  Diagnosis Date  . Hyperlipidemia   . Hypertension   . Hypothyroidism   . Osteoarthritis   . Endometrial polyp   . History of uterine fibroid   . PMB (postmenopausal bleeding)   . OSA (obstructive sleep apnea)     moderate OSA per study 06-21-2005--  per pt does need cpap anymore never purchased it  . GERD (gastroesophageal reflux disease)   . Wears glasses   . Wears dentures     upper  . Heart murmur   . Cancer [facility])     endometrial cancer    Past Surgical History  Procedure Laterality Date  . Knee arthroscopy w/ meniscectomy Bilateral right 04-12-2006//  left 12-29-2006    and Chondroplasty  . Excision benign right breast mass  age 56  . Dilatation & currettage/hysteroscopy with resectocope  12-13-2002  &  09-26-2004    polyp/  submucosal fibroid  . Transthoracic echocardiogram  04-02-2010    normal LV, ef 65%  . Tubal ligation  1980's  . Dilatation & curettage/hysteroscopy with myosure N/A 03/07/2015    Procedure: DILATATION & CURETTAGE/HYSTEROSCOPY WITH MYOSURE (POLYP);  Surgeon: Arvella Nigh, MD;  Location: [facility];  Service: Gynecology;  Laterality: N/A;  . Robotic assisted total hysterectomy with bilateral salpingo oopherectomy Bilateral 05/02/2015    Procedure: XI ROBOTIC ASSISTED TOTAL HYSTERECTOMY WITH BILATERAL SALPINGO OOPHORECTOMY AND SENTINAL LYMPH NODE BIOPSY;  Surgeon: Janie Morning, MD;  Location: [facility];  Service:  Gynecology;  Laterality: Bilateral;    Allergies: Hctz  Medications: Prior to Admission medications   Medication Sig Start Date End Date Taking? Authorizing Provider  allopurinol (ZYLOPRIM) 100 MG tablet TAKE ONE TABLET BY MOUTH ONCE DAILY 06/24/15  Yes Tasrif Ahmed, MD  amLODipine (NORVASC) 10 MG tablet TAKE ONE TABLET BY MOUTH ONCE DAILY 06/24/15  Yes Tasrif Ahmed, MD  dexamethasone (DECADRON) 4 MG tablet Take 5 tablets with food 12 hrs and 6 hrs before taxol 06/21/15  Yes Lennis P Livesay, MD  docusate sodium (COLACE) 100 MG capsule Take 1 capsule (100 mg total) by mouth 2 (two) times daily. 05/20/15  Yes Everitt Amber, MD  Ferrous Fumarate (HEMOCYTE - 106 MG FE) 324 (106 Fe) MG TABS tablet Take 1 tablet (106 mg of iron total) by mouth daily. On an empty stomach with OJ 06/25/15  Yes Lennis Marion Downer, MD  ibuprofen (ADVIL,MOTRIN) 200 MG tablet Take 200 mg by mouth every 6 (six) hours as needed for mild pain. Reported on 07/08/2015   Yes Historical Provider, MD  levothyroxine (SYNTHROID, LEVOTHROID) 112 MCG tablet Take 1 tablet (112 mcg total) by mouth daily. Patient taking differently: Take 112 mcg by mouth daily before breakfast.  11/23/14  Yes Burgess Estelle, MD  LORazepam (ATIVAN) 0.5 MG tablet Place 1 tablet under the tongue or swallow every 6 hours as needed for nausea. Will make sleepy. 06/21/15  Yes Lennis P Livesay, MD  ondansetron (ZOFRAN) 8 MG tablet Take 1 tablet (8 mg total) by mouth every 8 (eight) hours as needed for nausea. Will  make drowsy. 06/21/15  Yes Lennis Marion Downer, MD  oxyCODONE-acetaminophen (PERCOCET/ROXICET) 5-325 MG tablet Take 1-2 tablets by mouth every 4 (four) hours as needed (moderate to severe pain). 05/03/15  Yes Melissa D Cross, NP  simvastatin (ZOCOR) 20 MG tablet TAKE ONE TABLET BY MOUTH IN THE EVENING 06/24/15  Yes Tasrif Ahmed, MD  spironolactone (ALDACTONE) 25 MG tablet Take 25 mg by mouth daily. 05/13/15  Yes Historical Provider, MD  vitamin E (VITAMIN E) 400 UNIT capsule  Take 400 Units by mouth daily.   Yes Historical Provider, MD  lidocaine-prilocaine (EMLA) cream Apply to port 1-2 hours before accessing with a needle. Cover with plastic wrap. 07/10/15   Gordy Levan, MD     Family History  Problem Relation Age of Onset  . Hypertension Mother     Social History   Social History  . Marital Status: Divorced    Spouse Name: N/A  . Number of Children: 3  . Years of Education: N/A   Occupational History  . security    Social History Main Topics  . Smoking status: Former Smoker -- 2 years    Types: Cigarettes    Quit date: 02/28/1994  . Smokeless tobacco: Never Used  . Alcohol Use: No  . Drug Use: No  . Sexual Activity: Not Asked   Other Topics Concern  . None   Social History Narrative   Lives with her son.  Perimenopausal.     Review of Systems  Constitutional: Negative for fever and chills.  Respiratory: Negative for cough and shortness of breath.   Cardiovascular: Negative for chest pain.  Gastrointestinal: Negative for nausea, vomiting, abdominal pain and blood in stool.  Genitourinary: Negative for dysuria and hematuria.  Musculoskeletal: Negative for back pain.  Neurological: Negative for headaches.    Vital Signs: BP 152/73 mmHg  Pulse 91  Temp(Src) 98.3 F (36.8 C) (Oral)  Resp 18  Wt 177 lb (80.287 kg)  SpO2 100%  LMP 07/16/2014  Physical Exam  Constitutional: She is oriented to person, place, and time. She appears well-developed and well-nourished.  Cardiovascular: Normal rate and regular rhythm.   Pulmonary/Chest: Effort normal and breath sounds normal.  Abdominal: Soft. Bowel sounds are normal.  obese  Musculoskeletal: Normal range of motion.  Neurological: She is alert and oriented to person, place, and time.    Mallampati Score:     Imaging: No results found.  Labs:  CBC:  Recent Labs  07/02/15 1135 07/08/15 0957 07/10/15 1219 07/15/15 0733  WBC 5.0 1.9* 13.1* 6.8  HGB 13.0 11.8 12.3  12.3  HCT 40.7 36.8 39.3 36.9  PLT 163 Large platelets present, Occ small plt clump 160 157 144*    COAGS:  Recent Labs  07/15/15 0733  INR 1.03    BMP:  Recent Labs  04/25/15 1000 05/03/15 0423 06/19/15 0933  07/02/15 1135 07/08/15 0957 07/10/15 1219 07/15/15 0733  NA 142 140 140  < > 138 140 142 141  K 3.2* 3.9 4.3  < > 3.6 4.3 3.3* 4.0  CL 105 105 97  --   --   --   --  103  CO2 27 24 22   < > 21* 26 27 29   GLUCOSE 105* 139* 107*  < > 158* 108 139 114*  BUN 17 13 16   < > 25.7 11.3 8.1 15  CALCIUM 9.4 9.4 10.2  < > 9.6 9.8 9.6 9.4  CREATININE 0.60 0.67 0.59  < > 0.8 0.7 0.7 0.62  GFRNONAA >60 >60 99  --   --   --   --  >60  GFRAA >60 >60 114  --   --   --   --  >60  < > = values in this interval not displayed.  LIVER FUNCTION TESTS:  Recent Labs  07/02/15 1135 07/08/15 0957 07/10/15 1219 07/15/15 0733  BILITOT 0.58 0.41 0.41 0.7  AST 34 23 20 25   ALT 39 30 25 28   ALKPHOS 110 111 141 131*  PROT 8.0 7.7 7.3 7.7  ALBUMIN 3.9 3.7 3.6 4.4    TUMOR MARKERS: No results for input(s): AFPTM, CEA, CA199, CHROMGRNA in the last 8760 hours.  Assessment and Plan: 60 y.o. female with history of endometrioid endometrial carcinoma and prior laparoscopic hysterectomy/BSO April 2017. She has poor venous access and request now received for Port-A-Cath placement for additional chemotherapy.Risks and benefits discussed with the patient including, but not limited to bleeding, infection, pneumothorax, or fibrin sheath development and need for additional procedures. All of the patient's questions were answered, patient is agreeable to proceed. Consent signed and in chart.     Thank you for this interesting consult.  I greatly enjoyed meeting Lydia Walsh and look forward to participating in their care.  A copy of this report was sent to the requesting provider on this date.  Electronically Signed: D. Rowe Robert 07/15/2015, 8:40 AM   I spent a total of 20 minutes in  face to face in clinical consultation, greater than 50% of which was counseling/coordinating care for port a cath placement

## 2015-07-15 NOTE — Discharge Instructions (Signed)
Implanted Port Home Guide °An implanted port is a type of central line that is placed under the skin. Central lines are used to provide IV access when treatment or nutrition needs to be given through a person's veins. Implanted ports are used for long-term IV access. An implanted port may be placed because:  °· You need IV medicine that would be irritating to the small veins in your hands or arms.   °· You need long-term IV medicines, such as antibiotics.   °· You need IV nutrition for a long period.   °· You need frequent blood draws for lab tests.   °· You need dialysis.   °Implanted ports are usually placed in the chest area, but they can also be placed in the upper arm, the abdomen, or the leg. An implanted port has two main parts:  °· Reservoir. The reservoir is round and will appear as a small, raised area under your skin. The reservoir is the part where a needle is inserted to give medicines or draw blood.   °· Catheter. The catheter is a thin, flexible tube that extends from the reservoir. The catheter is placed into a large vein. Medicine that is inserted into the reservoir goes into the catheter and then into the vein.   °HOW WILL I CARE FOR MY INCISION SITE? °Do not get the incision site wet. Bathe or shower as directed by your health care provider.  °HOW IS MY PORT ACCESSED? °Special steps must be taken to access the port:  °· Before the port is accessed, a numbing cream can be placed on the skin. This helps numb the skin over the port site.   °· Your health care provider uses a sterile technique to access the port. °· Your health care provider must put on a mask and sterile gloves. °· The skin over your port is cleaned carefully with an antiseptic and allowed to dry. °· The port is gently pinched between sterile gloves, and a needle is inserted into the port. °· Only "non-coring" port needles should be used to access the port. Once the port is accessed, a blood return should be checked. This helps  ensure that the port is in the vein and is not clogged.   °· If your port needs to remain accessed for a constant infusion, a clear (transparent) bandage will be placed over the needle site. The bandage and needle will need to be changed every week, or as directed by your health care provider.   °· Keep the bandage covering the needle clean and dry. Do not get it wet. Follow your health care provider's instructions on how to take a shower or bath while the port is accessed.   °· If your port does not need to stay accessed, no bandage is needed over the port.   °WHAT IS FLUSHING? °Flushing helps keep the port from getting clogged. Follow your health care provider's instructions on how and when to flush the port. Ports are usually flushed with saline solution or a medicine called heparin. The need for flushing will depend on how the port is used.  °· If the port is used for intermittent medicines or blood draws, the port will need to be flushed:   °· After medicines have been given.   °· After blood has been drawn.   °· As part of routine maintenance.   °· If a constant infusion is running, the port may not need to be flushed.   °HOW LONG WILL MY PORT STAY IMPLANTED? °The port can stay in for as long as your health care   provider thinks it is needed. When it is time for the port to come out, surgery will be done to remove it. The procedure is similar to the one performed when the port was put in.  °WHEN SHOULD I SEEK IMMEDIATE MEDICAL CARE? °When you have an implanted port, you should seek immediate medical care if:  °· You notice a bad smell coming from the incision site.   °· You have swelling, redness, or drainage at the incision site.   °· You have more swelling or pain at the port site or the surrounding area.   °· You have a fever that is not controlled with medicine. °  °This information is not intended to replace advice given to you by your health care provider. Make sure you discuss any questions you have with  your health care provider. °  °Document Released: 12/29/2004 Document Revised: 10/19/2012 Document Reviewed: 09/05/2012 °Elsevier Interactive Patient Education ©2016 [employer] °Implanted Port Insertion, Care After °Refer to this sheet in the next few weeks. These instructions provide you with information on caring for yourself after your procedure. Your health care provider may also give you more specific instructions. Your treatment has been planned according to current medical practices, but problems sometimes occur. Call your health care provider if you have any problems or questions after your procedure. °WHAT TO EXPECT AFTER THE PROCEDURE °After your procedure, it is typical to have the following:  °· Discomfort at the port insertion site. Ice packs to the area will help. °· Bruising on the skin over the port. This will subside in 3-4 days. °HOME CARE INSTRUCTIONS °· After your port is placed, you will get a manufacturer's information card. The card has information about your port. Keep this card with you at all times.   °· Know what kind of port you have. There are many types of ports available.   °· Wear a medical alert bracelet in case of an emergency. This can help alert health care workers that you have a port.   °· The port can stay in for as long as your health care provider believes it is necessary.   °· A home health care nurse may give medicines and take care of the port.   °· You or a family member can get special training and directions for giving medicine and taking care of the port at home.   °SEEK MEDICAL CARE IF:  °· Your port does not flush or you are unable to get a blood return.   °· You have a fever or chills. °SEEK IMMEDIATE MEDICAL CARE IF: °· You have new fluid or pus coming from your incision.   °· You notice a bad smell coming from your incision site.   °· You have swelling, pain, or more redness at the incision or port site.   °· You have chest pain or shortness of breath. °  °This  information is not intended to replace advice given to you by your health care provider. Make sure you discuss any questions you have with your health care provider. °  °Document Released: 10/19/2012 Document Revised: 01/03/2013 Document Reviewed: 10/19/2012 °Elsevier Interactive Patient Education ©2016 [employer] °Moderate Conscious Sedation, Adult, Care After °Refer to this sheet in the next few weeks. These instructions provide you with information on caring for yourself after your procedure. Your health care provider may also give you more specific instructions. Your treatment has been planned according to current medical practices, but problems sometimes occur. Call your health care provider if you have any problems or questions after your   procedure. °WHAT TO EXPECT AFTER THE PROCEDURE  °After your procedure: °· You may feel sleepy, clumsy, and have poor balance for several hours. °· Vomiting may occur if you eat too soon after the procedure. °HOME CARE INSTRUCTIONS °· Do not participate in any activities where you could become injured for at least 24 hours. Do not: °¨ Drive. °¨ Swim. °¨ Ride a bicycle. °¨ Operate heavy machinery. °¨ Cook. °¨ Use power tools. °¨ Climb ladders. °¨ Work from a high place. °· Do not make important decisions or sign legal documents until you are improved. °· If you vomit, drink water, juice, or soup when you can drink without vomiting. Make sure you have little or no nausea before eating solid foods. °· Only take over-the-counter or prescription medicines for pain, discomfort, or fever as directed by your health care provider. °· Make sure you and your family fully understand everything about the medicines given to you, including what side effects may occur. °· You should not drink alcohol, take sleeping pills, or take medicines that cause drowsiness for at least 24 hours. °· If you smoke, do not smoke without supervision. °· If you are feeling better, you may resume normal  activities 24 hours after you were sedated. °· Keep all appointments with your health care provider. °SEEK MEDICAL CARE IF: °· Your skin is pale or bluish in color. °· You continue to feel nauseous or vomit. °· Your pain is getting worse and is not helped by medicine. °· You have bleeding or swelling. °· You are still sleepy or feeling clumsy after 24 hours. °SEEK IMMEDIATE MEDICAL CARE IF: °· You develop a rash. °· You have difficulty breathing. °· You develop any type of allergic problem. °· You have a fever. °MAKE SURE YOU: °· Understand these instructions. °· Will watch your condition. °· Will get help right away if you are not doing well or get worse. °  °This information is not intended to replace advice given to you by your health care provider. Make sure you discuss any questions you have with your health care provider. °  °Document Released: 10/19/2012 Document Revised: 01/19/2014 Document Reviewed: 10/19/2012 °Elsevier Interactive Patient Education ©2016 [employer] ° °

## 2015-07-17 ENCOUNTER — Ambulatory Visit (HOSPITAL_BASED_OUTPATIENT_CLINIC_OR_DEPARTMENT_OTHER): Payer: BLUE CROSS/BLUE SHIELD

## 2015-07-17 VITALS — BP 153/95 | HR 110 | Temp 98.1°F | Resp 22

## 2015-07-17 DIAGNOSIS — C541 Malignant neoplasm of endometrium: Secondary | ICD-10-CM | POA: Diagnosis not present

## 2015-07-17 DIAGNOSIS — Z5111 Encounter for antineoplastic chemotherapy: Secondary | ICD-10-CM

## 2015-07-17 MED ORDER — ONDANSETRON HCL 40 MG/20ML IJ SOLN
Freq: Once | INTRAMUSCULAR | Status: AC
  Administered 2015-07-17: 14:00:00 via INTRAVENOUS
  Filled 2015-07-17: qty 4

## 2015-07-17 MED ORDER — FAMOTIDINE IN NACL 20-0.9 MG/50ML-% IV SOLN
20.0000 mg | Freq: Once | INTRAVENOUS | Status: AC
  Administered 2015-07-17: 20 mg via INTRAVENOUS

## 2015-07-17 MED ORDER — SODIUM CHLORIDE 0.9 % IV SOLN
175.0000 mg/m2 | Freq: Once | INTRAVENOUS | Status: AC
  Administered 2015-07-17: 324 mg via INTRAVENOUS
  Filled 2015-07-17: qty 54

## 2015-07-17 MED ORDER — SODIUM CHLORIDE 0.9 % IV SOLN
Freq: Once | INTRAVENOUS | Status: AC
  Administered 2015-07-17: 15:00:00 via INTRAVENOUS
  Filled 2015-07-17: qty 5

## 2015-07-17 MED ORDER — HEPARIN SOD (PORK) LOCK FLUSH 100 UNIT/ML IV SOLN
500.0000 [IU] | Freq: Once | INTRAVENOUS | Status: AC | PRN
  Administered 2015-07-17: 500 [IU]
  Filled 2015-07-17: qty 5

## 2015-07-17 MED ORDER — DIPHENHYDRAMINE HCL 50 MG/ML IJ SOLN
25.0000 mg | Freq: Once | INTRAMUSCULAR | Status: AC
  Administered 2015-07-17: 25 mg via INTRAVENOUS

## 2015-07-17 MED ORDER — DIPHENHYDRAMINE HCL 50 MG/ML IJ SOLN
INTRAMUSCULAR | Status: AC
  Filled 2015-07-17: qty 1

## 2015-07-17 MED ORDER — SODIUM CHLORIDE 0.9% FLUSH
10.0000 mL | INTRAVENOUS | Status: DC | PRN
  Administered 2015-07-17: 10 mL
  Filled 2015-07-17: qty 10

## 2015-07-17 MED ORDER — FAMOTIDINE IN NACL 20-0.9 MG/50ML-% IV SOLN
INTRAVENOUS | Status: AC
  Filled 2015-07-17: qty 50

## 2015-07-17 MED ORDER — SODIUM CHLORIDE 0.9 % IV SOLN
Freq: Once | INTRAVENOUS | Status: AC
  Administered 2015-07-17: 14:00:00 via INTRAVENOUS

## 2015-07-17 MED ORDER — SODIUM CHLORIDE 0.9 % IV SOLN
604.0000 mg | Freq: Once | INTRAVENOUS | Status: AC
  Administered 2015-07-17: 600 mg via INTRAVENOUS
  Filled 2015-07-17: qty 60

## 2015-07-17 NOTE — Patient Instructions (Signed)
[facility] Discharge Instructions for Patients Receiving Chemotherapy  Today you received the following chemotherapy agents taxol and carboplatin.  To help prevent nausea and vomiting after your treatment, we encourage you to take your nausea medication.   If you develop nausea and vomiting that is not controlled by your nausea medication, call the clinic.   BELOW ARE SYMPTOMS THAT SHOULD BE REPORTED IMMEDIATELY:  *FEVER GREATER THAN 100.5 F  *CHILLS WITH OR WITHOUT FEVER  NAUSEA AND VOMITING THAT IS NOT CONTROLLED WITH YOUR NAUSEA MEDICATION  *UNUSUAL SHORTNESS OF BREATH  *UNUSUAL BRUISING OR BLEEDING  TENDERNESS IN MOUTH AND THROAT WITH OR WITHOUT PRESENCE OF ULCERS  *URINARY PROBLEMS  *BOWEL PROBLEMS  UNUSUAL RASH Items with * indicate a potential emergency and should be followed up as soon as possible.  Feel free to call the clinic you have any questions or concerns. The clinic phone number is [phone].  Please show the CHEMO ALERT CARD at check-in to the Emergency Department and triage nurse.   

## 2015-07-17 NOTE — Progress Notes (Signed)
Patient has increased pulse rate of 111.  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  She has not had much to eat or drink as she has been here since 10am.   1:15pm Discussed with Dr. Burr Medico.  Wait 15 minutes and re-evaluate.   Patient given po fluids [employer] /chips and ice cream to eat. 1336:   Pulse rate 104-106.  Checked manually.  Patient eating and drinking.  Denies any sx.   Discussed with Dr. Burr Medico.  Ok to proceed with tx today. 1445  VS repeated.  Pulse still elevated.  Patient states she feels better since she ate and drank some gingerale.

## 2015-07-18 ENCOUNTER — Ambulatory Visit (HOSPITAL_BASED_OUTPATIENT_CLINIC_OR_DEPARTMENT_OTHER): Payer: BLUE CROSS/BLUE SHIELD

## 2015-07-18 VITALS — BP 155/85 | HR 99 | Temp 97.5°F | Resp 20

## 2015-07-18 DIAGNOSIS — C541 Malignant neoplasm of endometrium: Secondary | ICD-10-CM | POA: Diagnosis not present

## 2015-07-18 DIAGNOSIS — D701 Agranulocytosis secondary to cancer chemotherapy: Secondary | ICD-10-CM | POA: Diagnosis not present

## 2015-07-18 MED ORDER — TBO-FILGRASTIM 480 MCG/0.8ML ~~LOC~~ SOSY
480.0000 ug | PREFILLED_SYRINGE | Freq: Once | SUBCUTANEOUS | Status: AC
  Administered 2015-07-18: 480 ug via SUBCUTANEOUS
  Filled 2015-07-18: qty 0.8

## 2015-07-18 NOTE — Patient Instructions (Signed)

## 2015-07-19 ENCOUNTER — Ambulatory Visit (HOSPITAL_BASED_OUTPATIENT_CLINIC_OR_DEPARTMENT_OTHER): Payer: BLUE CROSS/BLUE SHIELD

## 2015-07-19 ENCOUNTER — Encounter: Payer: Self-pay | Admitting: *Deleted

## 2015-07-19 ENCOUNTER — Encounter: Payer: Self-pay | Admitting: Oncology

## 2015-07-19 VITALS — BP 150/67 | HR 92 | Temp 98.3°F | Resp 20

## 2015-07-19 DIAGNOSIS — C541 Malignant neoplasm of endometrium: Secondary | ICD-10-CM

## 2015-07-19 DIAGNOSIS — D701 Agranulocytosis secondary to cancer chemotherapy: Secondary | ICD-10-CM

## 2015-07-19 MED ORDER — TBO-FILGRASTIM 480 MCG/0.8ML ~~LOC~~ SOSY
480.0000 ug | PREFILLED_SYRINGE | Freq: Once | SUBCUTANEOUS | Status: AC
  Administered 2015-07-19: 480 ug via SUBCUTANEOUS
  Filled 2015-07-19: qty 0.8

## 2015-07-19 NOTE — Patient Instructions (Signed)

## 2015-07-19 NOTE — Progress Notes (Signed)
[place] Work  Clinical Social Work was referred by Estate manager/land agent for assistance with food resources.  Clinical Social Worker contacted patient by phone to offer support and assess for needs.  Patient shared she is having difficulty paying for food.  CSW left bag of food from [facility] food pantry in infusion room- patient plans to pick up from RN when she receives injection tomorrow.  CSW also left list of food pantries in [place].  CSW encouraged patient to call with other questions or concerns.    Polo Riley, MSW, LCSW, OSW-C Clinical Social Worker [facility] [phone]

## 2015-07-19 NOTE — Progress Notes (Signed)
Talked to pt on 07/17/15, she changed her mind and would like to apply for the [facility] and [employer].  Once she brings her proof of income I'll see if she qualifies.  Pt also left a message on my vm today stating she needs assistance with food so I emailed the social workers to see if they can assist her with this need.

## 2015-07-20 ENCOUNTER — Ambulatory Visit (HOSPITAL_BASED_OUTPATIENT_CLINIC_OR_DEPARTMENT_OTHER): Payer: BLUE CROSS/BLUE SHIELD

## 2015-07-20 VITALS — BP 127/66 | HR 96 | Temp 98.5°F | Resp 20

## 2015-07-20 DIAGNOSIS — C541 Malignant neoplasm of endometrium: Secondary | ICD-10-CM

## 2015-07-20 DIAGNOSIS — D701 Agranulocytosis secondary to cancer chemotherapy: Secondary | ICD-10-CM

## 2015-07-20 MED ORDER — TBO-FILGRASTIM 480 MCG/0.8ML ~~LOC~~ SOSY
480.0000 ug | PREFILLED_SYRINGE | Freq: Once | SUBCUTANEOUS | Status: AC
  Administered 2015-07-20: 480 ug via SUBCUTANEOUS

## 2015-07-22 ENCOUNTER — Ambulatory Visit: Payer: BLUE CROSS/BLUE SHIELD

## 2015-07-22 ENCOUNTER — Ambulatory Visit (HOSPITAL_BASED_OUTPATIENT_CLINIC_OR_DEPARTMENT_OTHER): Payer: BLUE CROSS/BLUE SHIELD | Admitting: Nurse Practitioner

## 2015-07-22 ENCOUNTER — Telehealth: Payer: Self-pay

## 2015-07-22 ENCOUNTER — Ambulatory Visit (HOSPITAL_BASED_OUTPATIENT_CLINIC_OR_DEPARTMENT_OTHER): Payer: BLUE CROSS/BLUE SHIELD

## 2015-07-22 VITALS — BP 144/71 | HR 96 | Resp 18

## 2015-07-22 VITALS — BP 116/68 | HR 109 | Temp 98.9°F | Resp 18 | Ht 59.0 in | Wt 170.3 lb

## 2015-07-22 DIAGNOSIS — C541 Malignant neoplasm of endometrium: Secondary | ICD-10-CM

## 2015-07-22 DIAGNOSIS — R197 Diarrhea, unspecified: Secondary | ICD-10-CM

## 2015-07-22 DIAGNOSIS — E860 Dehydration: Secondary | ICD-10-CM | POA: Diagnosis not present

## 2015-07-22 DIAGNOSIS — R21 Rash and other nonspecific skin eruption: Secondary | ICD-10-CM | POA: Diagnosis not present

## 2015-07-22 DIAGNOSIS — E876 Hypokalemia: Secondary | ICD-10-CM

## 2015-07-22 LAB — CBC WITH DIFFERENTIAL/PLATELET
BASO%: 0.7 % (ref 0.0–2.0)
BASOS ABS: 0 10*3/uL (ref 0.0–0.1)
EOS%: 2.3 % (ref 0.0–7.0)
Eosinophils Absolute: 0.1 10*3/uL (ref 0.0–0.5)
HCT: 38.2 % (ref 34.8–46.6)
HEMOGLOBIN: 12.5 g/dL (ref 11.6–15.9)
LYMPH%: 19.6 % (ref 14.0–49.7)
MCH: 27.2 pg (ref 25.1–34.0)
MCHC: 32.7 g/dL (ref 31.5–36.0)
MCV: 83.3 fL (ref 79.5–101.0)
MONO#: 0.1 10*3/uL (ref 0.1–0.9)
MONO%: 1.7 % (ref 0.0–14.0)
NEUT#: 2.9 10*3/uL (ref 1.5–6.5)
NEUT%: 75.7 % (ref 38.4–76.8)
Platelets: 157 10*3/uL (ref 145–400)
RBC: 4.59 10*6/uL (ref 3.70–5.45)
RDW: 15.7 % — AB (ref 11.2–14.5)
WBC: 3.9 10*3/uL (ref 3.9–10.3)
lymph#: 0.8 10*3/uL — ABNORMAL LOW (ref 0.9–3.3)

## 2015-07-22 LAB — COMPREHENSIVE METABOLIC PANEL
ALBUMIN: 3.8 g/dL (ref 3.5–5.0)
ALT: 25 U/L (ref 0–55)
AST: 16 U/L (ref 5–34)
Alkaline Phosphatase: 154 U/L — ABNORMAL HIGH (ref 40–150)
Anion Gap: 13 mEq/L — ABNORMAL HIGH (ref 3–11)
BUN: 18 mg/dL (ref 7.0–26.0)
CHLORIDE: 102 meq/L (ref 98–109)
CO2: 22 meq/L (ref 22–29)
Calcium: 9.7 mg/dL (ref 8.4–10.4)
Creatinine: 0.8 mg/dL (ref 0.6–1.1)
EGFR: >90 mL/min/{1.73_m2} (ref >90)
GLUCOSE: 156 mg/dL — AB (ref 70–140)
POTASSIUM: 3.2 meq/L — AB (ref 3.5–5.1)
SODIUM: 138 meq/L (ref 136–145)
Total Bilirubin: 0.67 mg/dL (ref 0.20–1.20)
Total Protein: 7.6 g/dL (ref 6.4–8.3)

## 2015-07-22 MED ORDER — POTASSIUM CHLORIDE CRYS ER 20 MEQ PO TBCR: EXTENDED_RELEASE_TABLET | ORAL | Status: DC

## 2015-07-22 MED ORDER — SODIUM CHLORIDE 0.9 % IV SOLN
INTRAVENOUS | Status: AC
  Administered 2015-07-22: 17:00:00 via INTRAVENOUS

## 2015-07-22 MED ORDER — SODIUM CHLORIDE 0.9% FLUSH
10.0000 mL | INTRAVENOUS | Status: DC | PRN
  Administered 2015-07-22: 10 mL via INTRAVENOUS
  Filled 2015-07-22: qty 10

## 2015-07-22 MED ORDER — FAMOTIDINE IN NACL 20-0.9 MG/50ML-% IV SOLN
INTRAVENOUS | Status: AC
  Filled 2015-07-22: qty 50

## 2015-07-22 MED ORDER — DIPHENOXYLATE-ATROPINE 2.5-0.025 MG PO TABS: 2.0000 | ORAL_TABLET | Freq: Four times a day (QID) | ORAL | Status: DC | PRN

## 2015-07-22 MED ORDER — FAMOTIDINE IN NACL 20-0.9 MG/50ML-% IV SOLN
20.0000 mg | Freq: Once | INTRAVENOUS | Status: AC
  Administered 2015-07-22: 20 mg via INTRAVENOUS

## 2015-07-22 MED ORDER — LIDOCAINE-PRILOCAINE 2.5-2.5 % EX CREA
TOPICAL_CREAM | CUTANEOUS | Status: AC
  Filled 2015-07-22: qty 5

## 2015-07-22 MED ORDER — POTASSIUM CHLORIDE CRYS ER 20 MEQ PO TBCR
20.0000 meq | EXTENDED_RELEASE_TABLET | Freq: Once | ORAL | Status: AC
Start: 2015-07-22 — End: 2015-07-22
  Administered 2015-07-22: 20 meq via ORAL
  Filled 2015-07-22: qty 1

## 2015-07-22 MED ORDER — HEPARIN SOD (PORK) LOCK FLUSH 100 UNIT/ML IV SOLN
500.0000 [IU] | Freq: Once | INTRAVENOUS | Status: AC
  Administered 2015-07-22: 500 [IU] via INTRAVENOUS
  Filled 2015-07-22: qty 5

## 2015-07-22 NOTE — Patient Instructions (Signed)

## 2015-07-22 NOTE — Telephone Encounter (Signed)
Pt came to [facility] c/o weakness and diarrhea and possibly needing fluids. pof sent

## 2015-07-24 ENCOUNTER — Other Ambulatory Visit: Payer: Self-pay | Admitting: Oncology

## 2015-07-24 ENCOUNTER — Other Ambulatory Visit: Payer: Self-pay | Admitting: *Deleted

## 2015-07-24 ENCOUNTER — Encounter: Payer: Self-pay | Admitting: Nurse Practitioner

## 2015-07-24 DIAGNOSIS — E876 Hypokalemia: Secondary | ICD-10-CM | POA: Insufficient documentation

## 2015-07-24 DIAGNOSIS — C541 Malignant neoplasm of endometrium: Secondary | ICD-10-CM

## 2015-07-24 NOTE — Assessment & Plan Note (Signed)
Patient states that she been experiencing diarrhea; and feels dehydrated today.  She has been taking Lomotil as directed with only minimal effectiveness.  Patient was given IV fluid rehydration while at the [facility] today.  Patient denies any recent  antibiotic use.

## 2015-07-24 NOTE — Assessment & Plan Note (Signed)
Patient has been experiencing some diarrhea; and feels dehydrated today.  She'll receive IV fluid rehydration while cancer Center today.  She was also encouraged to push fluids is much as possible

## 2015-07-24 NOTE — Assessment & Plan Note (Addendum)
Patient received cycle 2 of her carboplatin/Taxol chemotherapy regimen on 07/17/2015.  She states that she developed a mild, pruritic rash to her generalized body following her chemotherapy.  She states she also had developed a mild rash following her initial cycle of chemotherapy as well.  She states she takes Benadryl; the rash improves. Patient denies any other allergic-type symptoms whatsoever.  Exam today reveals mild, scattered rash to patient's thighs only.  At this time.  Patient is scratching at the rash occasionally.  There was no other allergic reaction symptoms whatsoever.  Patient was in no acute distress.  Patient was given instructions to try and a drill 25 mg every 6 hours and Pepcid 20 mg every 12 hours to see if that helps.  Also, patient was advised to go directly to the emergency department for any worsening symptoms whatsoever.

## 2015-07-24 NOTE — Progress Notes (Signed)
SYMPTOM MANAGEMENT CLINIC    Chief Complaint: Diarrhea, rash, dehydration  HPI:  Lydia Walsh 60 y.o. female diagnosed with endometrial cancer.  Currently undergoing carboplatin/Taxol chemotherapy regimen.  Patient persisted cancer Center today with complaint of diarrhea, dehydration, and rash    No history exists.    Review of Systems  Constitutional: Positive for weight loss and malaise/fatigue.  Gastrointestinal: Positive for diarrhea.  Skin: Positive for itching and rash.  All other systems reviewed and are negative.   Past Medical History  Diagnosis Date  . Hyperlipidemia   . Hypertension   . Hypothyroidism   . Osteoarthritis   . Endometrial polyp   . History of uterine fibroid   . PMB (postmenopausal bleeding)   . OSA (obstructive sleep apnea)     moderate OSA per study 06-21-2005--  per pt does need cpap anymore never purchased it  . GERD (gastroesophageal reflux disease)   . Wears glasses   . Wears dentures     upper  . Heart murmur   . Cancer [facility])     endometrial cancer    Past Surgical History  Procedure Laterality Date  . Knee arthroscopy w/ meniscectomy Bilateral right 04-12-2006//  left 12-29-2006    and Chondroplasty  . Excision benign right breast mass  age 15  . Dilatation & currettage/hysteroscopy with resectocope  12-13-2002  &  09-26-2004    polyp/  submucosal fibroid  . Transthoracic echocardiogram  04-02-2010    normal LV, ef 65%  . Tubal ligation  1980's  . Dilatation & curettage/hysteroscopy with myosure N/A 03/07/2015    Procedure: DILATATION & CURETTAGE/HYSTEROSCOPY WITH MYOSURE (POLYP);  Surgeon: Arvella Nigh, MD;  Location: [facility];  Service: Gynecology;  Laterality: N/A;  . Robotic assisted total hysterectomy with bilateral salpingo oopherectomy Bilateral 05/02/2015    Procedure: XI ROBOTIC ASSISTED TOTAL HYSTERECTOMY WITH BILATERAL SALPINGO OOPHORECTOMY AND SENTINAL LYMPH NODE BIOPSY;  Surgeon: Janie Morning,  MD;  Location: [facility];  Service: Gynecology;  Laterality: Bilateral;    has Hypothyroidism; Dyslipidemia; GOUT; OBSTRUCTIVE SLEEP APNEA; Essential hypertension; ALLERGIC RHINITIS, SEASONAL; G E R D; LOW BACK PAIN, CHRONIC; Osteoarthritis; Unspecified constipation; Fatigue; Major depression ([place]); Palpitations; Atypical chest pain; Arm pain, left; Postmenopausal vaginal bleeding; Leg cramping; Status post hysteroscopy; Endometrial cancer ([place]); Obesity (BMI 35.0-39.9 without comorbidity) ([place]); Renal mass, left; Cholelithiasis without cholecystitis; Nephrolithiasis; Obesity (BMI 30-39.9); Iron deficiency anemia due to chronic blood loss; Diverticulitis of intestine without perforation or abscess without bleeding; Diarrhea; Rash; Weight loss; Dehydration; [employer] Federation 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 ([place]); Anemia, iron deficiency; Poor venous access; and Hypokalemia on her problem list.    is allergic to hctz.    Medication List       This list is accurate as of: 07/22/15 11:59 PM.  Always use your most recent med list.               allopurinol 100 MG tablet  Commonly known as:  ZYLOPRIM  TAKE ONE TABLET BY MOUTH ONCE DAILY     amLODipine 10 MG tablet  Commonly known as:  NORVASC  TAKE ONE TABLET BY MOUTH ONCE DAILY     dexamethasone 4 MG tablet  Commonly known as:  DECADRON  Take 5 tablets with food 12 hrs and 6 hrs before taxol     diphenoxylate-atropine 2.5-0.025 MG tablet  Commonly known as:  LOMOTIL  Take 2 tablets by mouth 4 (four) times daily as needed for  diarrhea or loose stools.     docusate sodium 100 MG capsule  Commonly known as:  COLACE  Take 1 capsule (100 mg total) by mouth 2 (two) times daily.     Ferrous Fumarate 324 (106 Fe) MG Tabs tablet  Commonly known as:  HEMOCYTE - 106 mg FE  Take 1 tablet (106 mg of iron total) by mouth daily. On an empty stomach with OJ     ibuprofen 200 MG tablet    Commonly known as:  ADVIL,MOTRIN  Take 200 mg by mouth every 6 (six) hours as needed for mild pain. Reported on 07/15/2015     levothyroxine 112 MCG tablet  Commonly known as:  SYNTHROID, LEVOTHROID  Take 1 tablet (112 mcg total) by mouth daily.     lidocaine-prilocaine cream  Commonly known as:  EMLA  Apply to port 1-2 hours before accessing with a needle. Cover with plastic wrap.     LORazepam 0.5 MG tablet  Commonly known as:  ATIVAN  Place 1 tablet under the tongue or swallow every 6 hours as needed for nausea. Will make sleepy.     ondansetron 8 MG tablet  Commonly known as:  ZOFRAN  Take 1 tablet (8 mg total) by mouth every 8 (eight) hours as needed for nausea. Will make drowsy.     oxyCODONE-acetaminophen 5-325 MG tablet  Commonly known as:  PERCOCET/ROXICET  Take 1-2 tablets by mouth every 4 (four) hours as needed (moderate to severe pain).     potassium chloride SA 20 MEQ tablet  Commonly known as:  K-DUR,KLOR-CON  Take 1 tab PO Q AM (starting Tuesday 07/23/15)     simvastatin 20 MG tablet  Commonly known as:  ZOCOR  TAKE ONE TABLET BY MOUTH IN THE EVENING     spironolactone 25 MG tablet  Commonly known as:  ALDACTONE  Take 25 mg by mouth daily.     vitamin E 400 UNIT capsule  Generic drug:  vitamin E  Take 400 Units by mouth daily.         PHYSICAL EXAMINATION  Oncology Vitals 07/22/2015 07/22/2015  Height - 150 cm  Weight - 77.248 kg  Weight (lbs) - 170 lbs 5 oz  BMI (kg/m2) - 34.4 kg/m2  Temp - 98.9  Pulse 96 109  Resp 18 18  SpO2 100 100  BSA (m2) - 1.79 m2   BP Readings from Last 2 Encounters:  07/22/15 144/71  07/22/15 116/68    Physical Exam  Constitutional: She is oriented to person, place, and time and well-developed, well-nourished, and in no distress.  HENT:  Head: Normocephalic and atraumatic.  Mouth/Throat: Oropharynx is clear and moist.  Eyes: Conjunctivae and EOM are normal. Pupils are equal, round, and reactive to light. Right eye  exhibits no discharge. Left eye exhibits no discharge. No scleral icterus.  Neck: Normal range of motion. Neck supple. No JVD present. No tracheal deviation present. No thyromegaly present.  Cardiovascular: Normal rate, regular rhythm, normal heart sounds and intact distal pulses.   Pulmonary/Chest: Effort normal and breath sounds normal. No respiratory distress. She has no wheezes. She has no rales. She exhibits no tenderness.  Abdominal: Soft. Bowel sounds are normal. She exhibits no distension and no mass. There is no tenderness. There is no rebound and no guarding.  Musculoskeletal: Normal range of motion. She exhibits no edema or tenderness.  Lymphadenopathy:    She has no cervical adenopathy.  Neurological: She is alert and oriented to person, place, and  time. Gait normal.  Skin: Skin is warm and dry. Rash noted. No erythema. No pallor.  Trace scattered rash to bilateral legs.  The patient is scratching at.  No hives noted.  Psychiatric: Affect normal.    LABORATORY DATA:. Appointment on 07/22/2015  Component Date Value Ref Range Status  . WBC 07/22/2015 3.9  3.9 - 10.3 10e3/uL Final  . NEUT# 07/22/2015 2.9  1.5 - 6.5 10e3/uL Final  . HGB 07/22/2015 12.5  11.6 - 15.9 g/dL Final  . HCT 07/22/2015 38.2  34.8 - 46.6 % Final  . Platelets 07/22/2015 157  145 - 400 10e3/uL Final  . MCV 07/22/2015 83.3  79.5 - 101.0 fL Final  . MCH 07/22/2015 27.2  25.1 - 34.0 pg Final  . MCHC 07/22/2015 32.7  31.5 - 36.0 g/dL Final  . RBC 07/22/2015 4.59  3.70 - 5.45 10e6/uL Final  . RDW 07/22/2015 15.7* 11.2 - 14.5 % Final  . lymph# 07/22/2015 0.8* 0.9 - 3.3 10e3/uL Final  . MONO# 07/22/2015 0.1  0.1 - 0.9 10e3/uL Final  . Eosinophils Absolute 07/22/2015 0.1  0.0 - 0.5 10e3/uL Final  . Basophils Absolute 07/22/2015 0.0  0.0 - 0.1 10e3/uL Final  . NEUT% 07/22/2015 75.7  38.4 - 76.8 % Final  . LYMPH% 07/22/2015 19.6  14.0 - 49.7 % Final  . MONO% 07/22/2015 1.7  0.0 - 14.0 % Final  . EOS% 07/22/2015  2.3  0.0 - 7.0 % Final  . BASO% 07/22/2015 0.7  0.0 - 2.0 % Final  . Sodium 07/22/2015 138  136 - 145 mEq/L Final  . Potassium 07/22/2015 3.2* 3.5 - 5.1 mEq/L Final  . Chloride 07/22/2015 102  98 - 109 mEq/L Final  . CO2 07/22/2015 22  22 - 29 mEq/L Final  . Glucose 07/22/2015 156* 70 - 140 mg/dl Final   Glucose reference range is for nonfasting patients. Fasting glucose reference range is 70- 100.  Marland Kitchen BUN 07/22/2015 18.0  7.0 - 26.0 mg/dL Final  . Creatinine 07/22/2015 0.8  0.6 - 1.1 mg/dL Final  . Total Bilirubin 07/22/2015 0.67  0.20 - 1.20 mg/dL Final  . Alkaline Phosphatase 07/22/2015 154* 40 - 150 U/L Final  . AST 07/22/2015 16  5 - 34 U/L Final  . ALT 07/22/2015 25  0 - 55 U/L Final  . Total Protein 07/22/2015 7.6  6.4 - 8.3 g/dL Final  . Albumin 07/22/2015 3.8  3.5 - 5.0 g/dL Final  . Calcium 07/22/2015 9.7  8.4 - 10.4 mg/dL Final  . Anion Gap 07/22/2015 13* 3 - 11 mEq/L Final  . EGFR 07/22/2015 >90  >90 ml/min/1.73 m2 Final   eGFR is calculated using the CKD-EPI Creatinine Equation (2009)    RADIOGRAPHIC STUDIES: No results found.  ASSESSMENT/PLAN:    Rash Patient received cycle 2 of her carboplatin/Taxol chemotherapy regimen on 07/17/2015.  She states that she developed a mild, pruritic rash to her generalized body following her chemotherapy.  She states she also had developed a mild rash following her initial cycle of chemotherapy as well.  She states she takes Benadryl; the rash improves. Patient denies any other allergic-type symptoms whatsoever.  Exam today reveals mild, scattered rash to patient's thighs only.  At this time.  Patient is scratching at the rash occasionally.  There was no other allergic reaction symptoms whatsoever.  Patient was in no acute distress.  Patient was given instructions to try and a drill 25 mg every 6 hours and Pepcid 20 mg every 12  hours to see if that helps.  Also, patient was advised to go directly to the emergency department for any  worsening symptoms whatsoever.        Hypokalemia Potassium was down to 3.2 today.  Most likely, this is secondary to patient's diarrhea and dehydration.  Patient was given potassium 20 mEq orally while at the cancer centers: Will also be prescribed potassium 20 mEq be taken on a daily basis for the next 4 days starting tomorrow.  Will continue to monitor closely.  Endometrial cancer [facility]) Patient received cycle 2 of her carboplatin/Taxol chemotherapy on 07/17/2015.  She received Granix injections on 07/18/2015 through 07/20/2015 for growth factor support.  She complains of a mild rash that developed after each cycle of chemotherapy; which was managed with benadryl.   See further notes for details.  Patient is scheduled.  Return on 07/25/2015 for labs, flush, and visit.  Diarrhea Patient states that she been experiencing diarrhea; and feels dehydrated today.  She has been taking Lomotil as directed with only minimal effectiveness.  Patient was given IV fluid rehydration while at the [facility] today.  Patient denies any recent  antibiotic use.    Dehydration Patient has been experiencing some diarrhea; and feels dehydrated today.  She'll receive IV fluid rehydration while cancer Center today.  She was also encouraged to push fluids is much as possible   Patient stated understanding of all instructions; and was in agreement with this plan of care. The patient knows to call the clinic with any problems, questions or concerns.   Total time spent with patient was 25 minutes;  with greater than 75 percent of that time spent in face to face counseling regarding patient's symptoms,  and coordination of care and follow up.  Disclaimer:This dictation was prepared with Dragon/digital dictation along with [employer]. Any transcriptional errors that result from this process are unintentional.  Drue Second, NP 07/24/2015

## 2015-07-24 NOTE — Assessment & Plan Note (Signed)
Patient received cycle 2 of her carboplatin/Taxol chemotherapy on 07/17/2015.  She received Granix injections on 07/18/2015 through 07/20/2015 for growth factor support.  She complains of a mild rash that developed after each cycle of chemotherapy; which was managed with benadryl.   See further notes for details.  Patient is scheduled.  Return on 07/25/2015 for labs, flush, and visit.

## 2015-07-24 NOTE — Assessment & Plan Note (Signed)
Potassium was down to 3.2 today.  Most likely, this is secondary to patient's diarrhea and dehydration.  Patient was given potassium 20 mEq orally while at the cancer centers: Will also be prescribed potassium 20 mEq be taken on a daily basis for the next 4 days starting tomorrow.  Will continue to monitor closely.

## 2015-07-25 ENCOUNTER — Ambulatory Visit: Payer: BLUE CROSS/BLUE SHIELD

## 2015-07-25 ENCOUNTER — Encounter: Payer: Self-pay | Admitting: Oncology

## 2015-07-25 ENCOUNTER — Telehealth: Payer: Self-pay | Admitting: Oncology

## 2015-07-25 ENCOUNTER — Ambulatory Visit (HOSPITAL_BASED_OUTPATIENT_CLINIC_OR_DEPARTMENT_OTHER): Payer: BLUE CROSS/BLUE SHIELD | Admitting: Oncology

## 2015-07-25 ENCOUNTER — Other Ambulatory Visit (HOSPITAL_BASED_OUTPATIENT_CLINIC_OR_DEPARTMENT_OTHER): Payer: BLUE CROSS/BLUE SHIELD

## 2015-07-25 VITALS — BP 149/67 | HR 90 | Temp 98.1°F | Resp 18

## 2015-07-25 VITALS — BP 161/81 | HR 92 | Temp 98.6°F | Resp 18 | Ht 59.0 in | Wt 173.3 lb

## 2015-07-25 DIAGNOSIS — C541 Malignant neoplasm of endometrium: Secondary | ICD-10-CM

## 2015-07-25 DIAGNOSIS — E669 Obesity, unspecified: Secondary | ICD-10-CM

## 2015-07-25 DIAGNOSIS — D701 Agranulocytosis secondary to cancer chemotherapy: Secondary | ICD-10-CM | POA: Diagnosis not present

## 2015-07-25 DIAGNOSIS — E039 Hypothyroidism, unspecified: Secondary | ICD-10-CM | POA: Diagnosis not present

## 2015-07-25 DIAGNOSIS — R21 Rash and other nonspecific skin eruption: Secondary | ICD-10-CM | POA: Diagnosis not present

## 2015-07-25 DIAGNOSIS — Z6836 Body mass index (BMI) 36.0-36.9, adult: Secondary | ICD-10-CM

## 2015-07-25 DIAGNOSIS — E860 Dehydration: Secondary | ICD-10-CM

## 2015-07-25 DIAGNOSIS — I10 Essential (primary) hypertension: Secondary | ICD-10-CM

## 2015-07-25 DIAGNOSIS — T451X5A Adverse effect of antineoplastic and immunosuppressive drugs, initial encounter: Secondary | ICD-10-CM

## 2015-07-25 LAB — COMPREHENSIVE METABOLIC PANEL
ALT: 47 U/L (ref 0–55)
ANION GAP: 11 meq/L (ref 3–11)
AST: 29 U/L (ref 5–34)
Albumin: 3.4 g/dL — ABNORMAL LOW (ref 3.5–5.0)
Alkaline Phosphatase: 145 U/L (ref 40–150)
BUN: 17.9 mg/dL (ref 7.0–26.0)
CO2: 28 meq/L (ref 22–29)
CREATININE: 0.7 mg/dL (ref 0.6–1.1)
Calcium: 9.8 mg/dL (ref 8.4–10.4)
Chloride: 104 mEq/L (ref 98–109)
EGFR: >90 mL/min/{1.73_m2} (ref >90)
GLUCOSE: 102 mg/dL (ref 70–140)
Potassium: 3.5 mEq/L (ref 3.5–5.1)
Sodium: 143 mEq/L (ref 136–145)
TOTAL PROTEIN: 7.3 g/dL (ref 6.4–8.3)

## 2015-07-25 LAB — CBC WITH DIFFERENTIAL/PLATELET
BASO%: 1.2 % (ref 0.0–2.0)
Basophils Absolute: 0 10*3/uL (ref 0.0–0.1)
EOS%: 1.8 % (ref 0.0–7.0)
Eosinophils Absolute: 0 10*3/uL (ref 0.0–0.5)
HCT: 33.9 % — ABNORMAL LOW (ref 34.8–46.6)
HGB: 10.9 g/dL — ABNORMAL LOW (ref 11.6–15.9)
LYMPH#: 0.9 10*3/uL (ref 0.9–3.3)
LYMPH%: 53.2 % — AB (ref 14.0–49.7)
MCH: 27 pg (ref 25.1–34.0)
MCHC: 32.2 g/dL (ref 31.5–36.0)
MCV: 83.9 fL (ref 79.5–101.0)
MONO#: 0.3 10*3/uL (ref 0.1–0.9)
MONO%: 15.4 % — ABNORMAL HIGH (ref 0.0–14.0)
NEUT%: 28.4 % — ABNORMAL LOW (ref 38.4–76.8)
NEUTROS ABS: 0.5 10*3/uL — AB (ref 1.5–6.5)
PLATELETS: 173 10*3/uL (ref 145–400)
RBC: 4.04 10*6/uL (ref 3.70–5.45)
RDW: 15.3 % — ABNORMAL HIGH (ref 11.2–14.5)
WBC: 1.7 10*3/uL — AB (ref 3.9–10.3)

## 2015-07-25 MED ORDER — TBO-FILGRASTIM 480 MCG/0.8ML ~~LOC~~ SOSY
480.0000 ug | PREFILLED_SYRINGE | Freq: Once | SUBCUTANEOUS | Status: AC
  Administered 2015-07-25: 480 ug via SUBCUTANEOUS
  Filled 2015-07-25: qty 0.8

## 2015-07-25 MED ORDER — HEPARIN SOD (PORK) LOCK FLUSH 100 UNIT/ML IV SOLN
500.0000 [IU] | Freq: Once | INTRAVENOUS | Status: AC
  Administered 2015-07-25: 500 [IU]
  Filled 2015-07-25: qty 5

## 2015-07-25 MED ORDER — SODIUM CHLORIDE 0.9 % IJ SOLN
10.0000 mL | Freq: Once | INTRAMUSCULAR | Status: AC
  Administered 2015-07-25: 10 mL
  Filled 2015-07-25: qty 10

## 2015-07-25 NOTE — Progress Notes (Signed)
OFFICE PROGRESS NOTE   July 25, 2015   Physicians: Terrence Dupont Rossi/ Janie Morning, Alver Sorrow, MD/ [facility], [name], Arvella Nigh, Dorna Leitz, _Grote  INTERVAL HISTORY:  Patient is seen, alone for visit, in continuing attention to IIIC1 grade 2 endometrioid endometrial carcinoma, having had cycle 2 carbo taxol on 07-17-15. She had granix 480 mg on days 2,3,4, however is neutropenic today with ANC 0.5. She is not febrile. Plan is for radiation after cycle 3 chemo, then completion of total 6 cycles of chemo.  Patient was seen at symptom management clinic on day 6, with weakness and diarrhea, IVF helpful. C diff was negative; stools are still somewhat loose but improved. She had aching after chemo and granix, did not try claritin, which I have again recommended. She has pruritic rash again on upper thighs as previously, may be related to new work uniform. She has not had much nausea, is trying to push po fluids, no SOB, no abdominal pain, no bladder symptoms, no problems with PAC. She notices slight peripheral neuropathy soles of feet, not bothersome. No bleeding. No LE swelling. Remainder of 10 point Review of Systems negative.    PAC in  She will retire on her birthday, [birth year]  ONCOLOGIC HISTORY Patient had ~ 6 months of post menopausal bleeding, at times heavy, prior to reestablishing with Dr Arvella Nigh. [place] 02-12-15 showed slightly enlarged uterus with multiple intramural fibroids up to 3.5 cm, endometrial thickness 4 mm, polypoid lesion in cavity, ovaries grossly normal. She had hysteroscopic resection of endometrial polyp, with grade 2 endometrial adenocarcinoma. She was seen by gyn oncology on 03-29-15, with uterus large without other findings of concern. She had robotic laparoscopic hysterectomy BSO with bilateral sentinel node sampling and lysis of adhesions by Dr Skeet Latch on 05-02-15. Pathology [phone]) grade 2 endometrial adenocarcinoma with 90% invasion of  myometrium (1.4 cm out of 1.5 cm), no involvement cervix or adnexa, + focal LVSI, 2 benign right sentinel nodes and no left pelvic nodes in specimen; MMR normal. Surgery was uncomplicated, patient DC on post op day 1; she has recovered well from surgery, returning to work after 3 weeks. . She had CT AP 05-24-15 with mild left common iliac adenopathy up to 1.1 cm as well as 0.7 cm low aortocaval node, and a 1.6 x 1.6 cm renal mass posterior lower left kidney indeterminate. PET 06-12-15 had SUV 4.1 at the 10 mm left common iliac node, other nodes too small for PET evaluation; mildly hyperdense lesion lower pole left kidney 1.8 cm. She saw Dr Sondra Come in consultation on 06-06-15. Recommendation from surgical pathology and imaging is for chemotherapy and radiation in sandwich fashion.  First carboplatin taxol given 06-26-15, neutropenic with ANC 0.4 on day 13 cycle 1, gCSF added.  Objective:  Vital signs in last 24 hours: Weight 173, which is down 5 lbs, BMI 35  Alert, oriented and appropriate. Ambulatory without assistance difficulty.  Alopecia  HEENT:PERRL, sclerae not icteric. Oral mucosa moist without lesions, posterior pharynx clear.  Neck supple. No JVD.  Lymphatics:no cervical,supraclavicular adenopathy Resp: clear to auscultation bilaterally and normal percussion bilaterally Cardio: regular rate and rhythm. No gallop. GI: abdomen obese, soft, nontender, not distended, no appreciable mass or organomegaly. Normally active bowel sounds. Surgical incision not remarkable. Musculoskeletal/ Extremities: without pitting edema, cords, tenderness Neuro: no significant peripheral neuropathy. Otherwise nonfocal. PSYCH appropriate mood and affect Skin without ecchymosis, petechiae. Slightly erythematous, papular rash scattered posterior thighs and upper inner thighs, not vesicular. Portacath-without  erythema or tenderness  Lab Results:  Results for orders placed or performed in visit on 07/22/15  CBC  with Differential  Result Value Ref Range   WBC 3.9 3.9 - 10.3 10e3/uL   NEUT# 2.9 1.5 - 6.5 10e3/uL   HGB 12.5 11.6 - 15.9 g/dL   HCT 43.2 08.0 - 70.5 %   Platelets 157 145 - 400 10e3/uL   MCV 83.3 79.5 - 101.0 fL   MCH 27.2 25.1 - 34.0 pg   MCHC 32.7 31.5 - 36.0 g/dL   RBC 6.32 1.18 - 4.91 10e6/uL   RDW 15.7 (H) 11.2 - 14.5 %   lymph# 0.8 (L) 0.9 - 3.3 10e3/uL   MONO# 0.1 0.1 - 0.9 10e3/uL   Eosinophils Absolute 0.1 0.0 - 0.5 10e3/uL   Basophils Absolute 0.0 0.0 - 0.1 10e3/uL   NEUT% 75.7 38.4 - 76.8 %   LYMPH% 19.6 14.0 - 49.7 %   MONO% 1.7 0.0 - 14.0 %   EOS% 2.3 0.0 - 7.0 %   BASO% 0.7 0.0 - 2.0 %  Comprehensive metabolic panel  Result Value Ref Range   Sodium 138 136 - 145 mEq/L   Potassium 3.2 (L) 3.5 - 5.1 mEq/L   Chloride 102 98 - 109 mEq/L   CO2 22 22 - 29 mEq/L   Glucose 156 (H) 70 - 140 mg/dl   BUN 49.0 7.0 - 87.6 mg/dL   Creatinine 0.8 0.6 - 1.1 mg/dL   Total Bilirubin 9.97 0.20 - 1.20 mg/dL   Alkaline Phosphatase 154 (H) 40 - 150 U/L   AST 16 5 - 34 U/L   ALT 25 0 - 55 U/L   Total Protein 7.6 6.4 - 8.3 g/dL   Albumin 3.8 3.5 - 5.0 g/dL   Calcium 9.7 8.4 - 26.9 mg/dL   Anion Gap 13 (H) 3 - 11 mEq/L   EGFR >90 >90 ml/min/1.73 m2     Studies/Results:  No results found.  Medications: I have reviewed the patient's current medications. Begin claritin daily, as this may help rash and taxol/ granix aches.   DISCUSSION Claritin instead of benadryl as above, as benadryl makes her drowsy. By history now, new work uniform may be cause of recurrent rash on upper thighs.  Neutropenic precautions reviewed. Granix today then recheck CBC on 7-14 to see if additional doses needed.  Will need at least 4 doses of granix with cycle 3 chemo. Patient will be scheduled for IVF after next chemo, as this has been needed again with most recent treatment.    Reviewed plan for radiation after cycle 3 chemo. Message to Dr Roselind Messier Renaldo Reel re scheduling back to radiation  oncology.  Assessment/Plan:  1.IIIC1 grade 2 endometrioid endometrial carcinoma: post robotic assisted laparoscopic hysterectomy BSO and right pelvic sentinel nodes, with invasion 90% of myometrium and CT/PET evidence of left common iliac node involvement. Plan total 6 cycles of carboplatin taxol chemotherapy in sandwich fashion with RT. Cycle 1 given 06-26-15, chemo neutropenia day 13. She is neutropenic again now despite granix x 3 following cycle 2, plan as above. She will have cycle 3 on 7-26 as long as ANC >=1.5 and plt >=100k, with granix x 4 days after.  2.Iron deficiency related to 6+ months of vaginal bleeding: oral iron as ferrous fumarate 3.questionable mass left kidney and right nephrolithiasis: UA negative for blood 07-08-15. May need urology input for left kidney after present treatment completes 4.hypothyroidism on medication, monitored by primary care 5.obesity, BMI 36. Exercise limited  by chronic problems knees 6. Cholelithiasis, not presently symptomatic 7.diverticulitis and hemorrhoids by colonoscopy 2007. She is due repeat colonoscopy, likely best to wait on this also until present treatment completed 8.minimal past tobacco 9.HTN, elevated lipids. Good EF 2012 10.EMR mention of OSA 11.PAC placed by IR 12. Skin rash unclear etiology: timing and pattern did not suggest drug reaction/ chemo, seems contact.  Careful history now suggests it may be from new work uniforms. Claritin may help ongoing also.   All questions answered and patient understands instructions. Chemo, granix and IVF orders placed. Time spent 25 min including >50% counseling and coordination of care.    Evlyn Clines, MD   07/25/2015, 12:07 PM

## 2015-07-25 NOTE — Telephone Encounter (Signed)
per pof to sch pt appt-gave pt copy of avs °

## 2015-07-26 ENCOUNTER — Ambulatory Visit (HOSPITAL_BASED_OUTPATIENT_CLINIC_OR_DEPARTMENT_OTHER): Payer: BLUE CROSS/BLUE SHIELD

## 2015-07-26 VITALS — BP 147/85 | HR 94 | Temp 98.2°F | Resp 20

## 2015-07-26 DIAGNOSIS — Z5189 Encounter for other specified aftercare: Secondary | ICD-10-CM

## 2015-07-26 DIAGNOSIS — C541 Malignant neoplasm of endometrium: Secondary | ICD-10-CM

## 2015-07-26 MED ORDER — TBO-FILGRASTIM 480 MCG/0.8ML ~~LOC~~ SOSY
480.0000 ug | PREFILLED_SYRINGE | Freq: Once | SUBCUTANEOUS | Status: AC
  Administered 2015-07-26: 480 ug via SUBCUTANEOUS
  Filled 2015-07-26: qty 0.8

## 2015-07-26 NOTE — Patient Instructions (Signed)

## 2015-07-27 MED ORDER — SODIUM CHLORIDE 0.9 % IV SOLN: INTRAVENOUS | Status: DC

## 2015-07-31 ENCOUNTER — Ambulatory Visit (HOSPITAL_COMMUNITY)
Admission: EM | Admit: 2015-07-31 | Discharge: 2015-07-31 | Disposition: A | Discharge status: Home / Self Care | Payer: BLUE CROSS/BLUE SHIELD | Attending: Emergency Medicine | Admitting: Emergency Medicine

## 2015-07-31 ENCOUNTER — Ambulatory Visit (INDEPENDENT_AMBULATORY_CARE_PROVIDER_SITE_OTHER): Payer: BLUE CROSS/BLUE SHIELD

## 2015-07-31 ENCOUNTER — Encounter (HOSPITAL_COMMUNITY): Payer: Self-pay | Admitting: Emergency Medicine

## 2015-07-31 DIAGNOSIS — S92911A Unspecified fracture of right toe(s), initial encounter for closed fracture: Secondary | ICD-10-CM

## 2015-07-31 DIAGNOSIS — S92341A Displaced fracture of fourth metatarsal bone, right foot, initial encounter for closed fracture: Secondary | ICD-10-CM | POA: Diagnosis not present

## 2015-07-31 MED ORDER — HYDROCODONE-ACETAMINOPHEN 5-325 MG PO TABS: 1.0000 | ORAL_TABLET | ORAL | Status: DC | PRN

## 2015-07-31 NOTE — ED Notes (Signed)
The patient presented to the [facility] with a complaint of pain to the 4th toe on her right foot secondary to stubbing it on a stool last night.

## 2015-07-31 NOTE — Discharge Instructions (Signed)
You broke the toe. This will heal well. Keep the toes buddy taped for the next 2 weeks. With a postop shoe for the next week, then as needed for comfort. Take Tylenol and ibuprofen as needed for pain. Use the hydrocodone every 4-6 hours as needed for severe pain. Apply ice to help with the swelling and bruising. If you are still having a lot of pain at 2 weeks, please come back for a recheck.

## 2015-07-31 NOTE — ED Provider Notes (Signed)
CSN: [id number]     Arrival date & time 07/31/15  1111 History   First MD Initiated Contact with Patient 07/31/15 1133     Chief Complaint  Patient presents with  . Toe Injury   (Consider location/radiation/quality/duration/timing/severity/associated sxs/prior Treatment) HPI She is a 60 year old woman here for evaluation of toe injury. She states she stubbed her right fourth toe on a wooden table last night. She has had persistent throbbing pain since that time. She states it is bruised and a little swollen. She has pain with movement and walking.  Past Medical History  Diagnosis Date  . Hyperlipidemia   . Hypertension   . Hypothyroidism   . Osteoarthritis   . Endometrial polyp   . History of uterine fibroid   . PMB (postmenopausal bleeding)   . OSA (obstructive sleep apnea)     moderate OSA per study 06-21-2005--  per pt does need cpap anymore never purchased it  . GERD (gastroesophageal reflux disease)   . Wears glasses   . Wears dentures     upper  . Heart murmur   . Cancer [facility])     endometrial cancer   Past Surgical History  Procedure Laterality Date  . Knee arthroscopy w/ meniscectomy Bilateral right 04-12-2006//  left 12-29-2006    and Chondroplasty  . Excision benign right breast mass  age 53  . Dilatation & currettage/hysteroscopy with resectocope  12-13-2002  &  09-26-2004    polyp/  submucosal fibroid  . Transthoracic echocardiogram  04-02-2010    normal LV, ef 65%  . Tubal ligation  1980's  . Dilatation & curettage/hysteroscopy with myosure N/A 03/07/2015    Procedure: DILATATION & CURETTAGE/HYSTEROSCOPY WITH MYOSURE (POLYP);  Surgeon: Arvella Nigh, MD;  Location: [facility];  Service: Gynecology;  Laterality: N/A;  . Robotic assisted total hysterectomy with bilateral salpingo oopherectomy Bilateral 05/02/2015    Procedure: XI ROBOTIC ASSISTED TOTAL HYSTERECTOMY WITH BILATERAL SALPINGO OOPHORECTOMY AND SENTINAL LYMPH NODE BIOPSY;  Surgeon: Janie Morning, MD;  Location: [facility];  Service: Gynecology;  Laterality: Bilateral;   Family History  Problem Relation Age of Onset  . Hypertension Mother    Social History  Substance Use Topics  . Smoking status: Former Smoker -- 2 years    Types: Cigarettes    Quit date: 02/28/1994  . Smokeless tobacco: Never Used  . Alcohol Use: No   OB History    No data available     Review of Systems As in history of present illness Allergies  Hctz  Home Medications   Prior to Admission medications   Medication Sig Start Date End Date Taking? Authorizing Provider  allopurinol (ZYLOPRIM) 100 MG tablet TAKE ONE TABLET BY MOUTH ONCE DAILY 06/24/15  Yes Tasrif Ahmed, MD  amLODipine (NORVASC) 10 MG tablet TAKE ONE TABLET BY MOUTH ONCE DAILY 06/24/15  Yes Tasrif Ahmed, MD  diphenoxylate-atropine (LOMOTIL) 2.5-0.025 MG tablet Take 2 tablets by mouth 4 (four) times daily as needed for diarrhea or loose stools. 07/22/15  Yes Susanne Borders, NP  docusate sodium (COLACE) 100 MG capsule Take 1 capsule (100 mg total) by mouth 2 (two) times daily. 05/20/15  Yes Everitt Amber, MD  Ferrous Fumarate (HEMOCYTE - 106 MG FE) 324 (106 Fe) MG TABS tablet Take 1 tablet (106 mg of iron total) by mouth daily. On an empty stomach with OJ 06/25/15  Yes Lennis Marion Downer, MD  ibuprofen (ADVIL,MOTRIN) 200 MG tablet Take 200 mg by mouth every 6 (six) hours as  needed for mild pain. Reported on 07/15/2015   Yes Historical Provider, MD  levothyroxine (SYNTHROID, LEVOTHROID) 112 MCG tablet Take 1 tablet (112 mcg total) by mouth daily. Patient taking differently: Take 112 mcg by mouth daily before breakfast.  11/23/14  Yes Burgess Estelle, MD  ondansetron (ZOFRAN) 8 MG tablet Take 1 tablet (8 mg total) by mouth every 8 (eight) hours as needed for nausea. Will make drowsy. 06/21/15  Yes Lennis Marion Downer, MD  potassium chloride SA (K-DUR,KLOR-CON) 20 MEQ tablet Take 1 tab PO Q AM (starting Tuesday 07/23/15) 07/22/15  Yes Susanne Borders, NP   simvastatin (ZOCOR) 20 MG tablet TAKE ONE TABLET BY MOUTH IN THE EVENING 06/24/15  Yes Tasrif Ahmed, MD  spironolactone (ALDACTONE) 25 MG tablet Take 25 mg by mouth daily. 05/13/15  Yes Historical Provider, MD  vitamin E (VITAMIN E) 400 UNIT capsule Take 400 Units by mouth daily.   Yes Historical Provider, MD  dexamethasone (DECADRON) 4 MG tablet Take 5 tablets with food 12 hrs and 6 hrs before taxol 07/15/15   Lennis Marion Downer, MD  HYDROcodone-acetaminophen (NORCO/VICODIN) 5-325 MG tablet Take 1 tablet by mouth every 4 (four) hours as needed for severe pain. 07/31/15   Melony Overly, MD  lidocaine-prilocaine (EMLA) cream Apply to port 1-2 hours before accessing with a needle. Cover with plastic wrap. 07/15/15   Lennis Marion Downer, MD  LORazepam (ATIVAN) 0.5 MG tablet Place 1 tablet under the tongue or swallow every 6 hours as needed for nausea. Will make sleepy. 06/21/15   Lennis Marion Downer, MD   Meds Ordered and Administered this Visit  Medications - No data to display  BP 153/75 mmHg  Pulse 90  Temp(Src) 98 F (36.7 C) (Oral)  Resp 16  SpO2 100%  LMP 07/16/2014 No data found.   Physical Exam  Constitutional: She is oriented to person, place, and time. She appears well-developed and well-nourished. No distress.  Cardiovascular: Normal rate.   Pulmonary/Chest: Effort normal.  Musculoskeletal:  Right foot: 2+ DP pulse. She does have some bruising and minimal swelling of the fourth toe and the base of the fourth toe. She is quite tender to palpation over the proximal and middle phalanx. Pain with active and passive movement of the fourth toe.  Neurological: She is alert and oriented to person, place, and time.    ED Course  Procedures (including critical care time)  Labs Review Labs Reviewed - No data to display  Imaging Review Dg Foot Complete Right  07/31/2015  CLINICAL DATA:  Initial encounter for Per pt: stumped the right foot last night on a table leg, injuring the right foot. Pain is  right foot, 4th toe. No prior injury to the right foot. Patient is not a diabetic. EXAM: RIGHT FOOT COMPLETE - 3+ VIEW COMPARISON:  02/27/2004 FINDINGS: Progression of moderate to marked osteoarthritis involving the first metatarsal phalangeal joint. Minimally displaced, oblique fracture involving the proximal phalanx of the fourth. No intra-articular extension. IMPRESSION: Fourth proximal phalangeal fracture. 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 Electronically Signed   By: Abigail Miyamoto M.D.   On: 07/31/2015 12:36     MDM   1. Closed fracture of proximal phalanx of toe of right foot    Toes buddy taped and postop shoe given. Tylenol or ibuprofen as needed for pain. Recommended ice to help with swelling and bruising. Prescription given for hydrocodone, #10 tablets. Review of the [place] shows she received Percocet 5/325 mg, #30  tablets in April. Discussed that if her pain is basically resolved in 2 weeks, she does not need additional follow-up. Follow-up as needed.    Melony Overly, MD 07/31/15 1257

## 2015-08-04 ENCOUNTER — Other Ambulatory Visit: Payer: Self-pay | Admitting: Internal Medicine

## 2015-08-06 ENCOUNTER — Other Ambulatory Visit: Payer: Self-pay | Admitting: *Deleted

## 2015-08-06 ENCOUNTER — Telehealth: Payer: Self-pay

## 2015-08-06 MED ORDER — LEVOTHYROXINE SODIUM 112 MCG PO TABS: 112.0000 ug | ORAL_TABLET | Freq: Every day | ORAL | 0 refills | Status: DC

## 2015-08-06 NOTE — Telephone Encounter (Signed)
Levothyroxine was the only med that has not got refills

## 2015-08-06 NOTE — Telephone Encounter (Signed)
Requesting amlodipine, allopurinol, spironolactone, simvastatin and levothyroxine to be filled @ [facility] on [address].

## 2015-08-07 ENCOUNTER — Other Ambulatory Visit (HOSPITAL_BASED_OUTPATIENT_CLINIC_OR_DEPARTMENT_OTHER): Payer: BLUE CROSS/BLUE SHIELD

## 2015-08-07 ENCOUNTER — Ambulatory Visit (HOSPITAL_BASED_OUTPATIENT_CLINIC_OR_DEPARTMENT_OTHER): Payer: BLUE CROSS/BLUE SHIELD

## 2015-08-07 ENCOUNTER — Ambulatory Visit: Payer: BLUE CROSS/BLUE SHIELD

## 2015-08-07 VITALS — BP 143/81 | HR 99 | Temp 98.2°F | Resp 16

## 2015-08-07 DIAGNOSIS — Z5111 Encounter for antineoplastic chemotherapy: Secondary | ICD-10-CM | POA: Diagnosis not present

## 2015-08-07 DIAGNOSIS — C541 Malignant neoplasm of endometrium: Secondary | ICD-10-CM

## 2015-08-07 DIAGNOSIS — Z95828 Presence of other vascular implants and grafts: Secondary | ICD-10-CM

## 2015-08-07 LAB — COMPREHENSIVE METABOLIC PANEL
ALBUMIN: 4 g/dL (ref 3.5–5.0)
ALK PHOS: 132 U/L (ref 40–150)
ALT: 20 U/L (ref 0–55)
ANION GAP: 12 meq/L — AB (ref 3–11)
AST: 14 U/L (ref 5–34)
BILIRUBIN TOTAL: 0.42 mg/dL (ref 0.20–1.20)
BUN: 21.1 mg/dL (ref 7.0–26.0)
CALCIUM: 10.1 mg/dL (ref 8.4–10.4)
CO2: 22 mEq/L (ref 22–29)
CREATININE: 0.7 mg/dL (ref 0.6–1.1)
Chloride: 105 mEq/L (ref 98–109)
Glucose: 153 mg/dl — ABNORMAL HIGH (ref 70–140)
Potassium: 3.7 mEq/L (ref 3.5–5.1)
Sodium: 139 mEq/L (ref 136–145)
TOTAL PROTEIN: 7.9 g/dL (ref 6.4–8.3)

## 2015-08-07 LAB — CBC WITH DIFFERENTIAL/PLATELET
BASO%: 0.3 % (ref 0.0–2.0)
Basophils Absolute: 0 10*3/uL (ref 0.0–0.1)
EOS ABS: 0 10*3/uL (ref 0.0–0.5)
EOS%: 0 % (ref 0.0–7.0)
HEMATOCRIT: 36.6 % (ref 34.8–46.6)
HEMOGLOBIN: 12 g/dL (ref 11.6–15.9)
LYMPH#: 0.6 10*3/uL — AB (ref 0.9–3.3)
LYMPH%: 6.8 % — AB (ref 14.0–49.7)
MCH: 27.4 pg (ref 25.1–34.0)
MCHC: 32.7 g/dL (ref 31.5–36.0)
MCV: 83.7 fL (ref 79.5–101.0)
MONO#: 0 10*3/uL — AB (ref 0.1–0.9)
MONO%: 0.3 % (ref 0.0–14.0)
NEUT%: 92.6 % — ABNORMAL HIGH (ref 38.4–76.8)
NEUTROS ABS: 8.2 10*3/uL — AB (ref 1.5–6.5)
PLATELETS: 191 10*3/uL (ref 145–400)
RBC: 4.37 10*6/uL (ref 3.70–5.45)
RDW: 17.3 % — AB (ref 11.2–14.5)
WBC: 8.9 10*3/uL (ref 3.9–10.3)

## 2015-08-07 MED ORDER — FAMOTIDINE IN NACL 20-0.9 MG/50ML-% IV SOLN
20.0000 mg | Freq: Once | INTRAVENOUS | Status: AC
  Administered 2015-08-07: 20 mg via INTRAVENOUS

## 2015-08-07 MED ORDER — SODIUM CHLORIDE 0.9 % IV SOLN
175.0000 mg/m2 | Freq: Once | INTRAVENOUS | Status: AC
  Administered 2015-08-07: 324 mg via INTRAVENOUS
  Filled 2015-08-07: qty 54

## 2015-08-07 MED ORDER — SODIUM CHLORIDE 0.9% FLUSH
10.0000 mL | INTRAVENOUS | Status: DC | PRN
  Administered 2015-08-07: 10 mL via INTRAVENOUS
  Filled 2015-08-07: qty 10

## 2015-08-07 MED ORDER — SODIUM CHLORIDE 0.9 % IV SOLN
604.0000 mg | Freq: Once | INTRAVENOUS | Status: AC
  Administered 2015-08-07: 600 mg via INTRAVENOUS
  Filled 2015-08-07: qty 60

## 2015-08-07 MED ORDER — FAMOTIDINE IN NACL 20-0.9 MG/50ML-% IV SOLN
INTRAVENOUS | Status: AC
  Filled 2015-08-07: qty 50

## 2015-08-07 MED ORDER — SODIUM CHLORIDE 0.9% FLUSH
10.0000 mL | INTRAVENOUS | Status: DC | PRN
  Administered 2015-08-07: 10 mL
  Filled 2015-08-07: qty 10

## 2015-08-07 MED ORDER — DIPHENHYDRAMINE HCL 50 MG/ML IJ SOLN
25.0000 mg | Freq: Once | INTRAMUSCULAR | Status: AC
  Administered 2015-08-07: 25 mg via INTRAVENOUS

## 2015-08-07 MED ORDER — DIPHENHYDRAMINE HCL 50 MG/ML IJ SOLN
INTRAMUSCULAR | Status: AC
  Filled 2015-08-07: qty 1

## 2015-08-07 MED ORDER — SODIUM CHLORIDE 0.9 % IV SOLN
Freq: Once | INTRAVENOUS | Status: AC
  Administered 2015-08-07: 12:00:00 via INTRAVENOUS

## 2015-08-07 MED ORDER — SODIUM CHLORIDE 0.9 % IV SOLN
Freq: Once | INTRAVENOUS | Status: AC
  Administered 2015-08-07: 12:00:00 via INTRAVENOUS
  Filled 2015-08-07: qty 4

## 2015-08-07 MED ORDER — HEPARIN SOD (PORK) LOCK FLUSH 100 UNIT/ML IV SOLN
500.0000 [IU] | Freq: Once | INTRAVENOUS | Status: AC | PRN
  Administered 2015-08-07: 500 [IU]
  Filled 2015-08-07: qty 5

## 2015-08-07 MED ORDER — SODIUM CHLORIDE 0.9 % IV SOLN
Freq: Once | INTRAVENOUS | Status: AC
  Administered 2015-08-07: 13:00:00 via INTRAVENOUS
  Filled 2015-08-07: qty 5

## 2015-08-07 NOTE — Patient Instructions (Signed)
[facility] Discharge Instructions for Patients Receiving Chemotherapy  Today you received the following chemotherapy agents Carboplatin & Taxol  To help prevent nausea and vomiting after your treatment, we encourage you to take your nausea medication as directed.   If you develop nausea and vomiting that is not controlled by your nausea medication, call the clinic.   BELOW ARE SYMPTOMS THAT SHOULD BE REPORTED IMMEDIATELY:  *FEVER GREATER THAN 100.5 F  *CHILLS WITH OR WITHOUT FEVER  NAUSEA AND VOMITING THAT IS NOT CONTROLLED WITH YOUR NAUSEA MEDICATION  *UNUSUAL SHORTNESS OF BREATH  *UNUSUAL BRUISING OR BLEEDING  TENDERNESS IN MOUTH AND THROAT WITH OR WITHOUT PRESENCE OF ULCERS  *URINARY PROBLEMS  *BOWEL PROBLEMS  UNUSUAL RASH Items with * indicate a potential emergency and should be followed up as soon as possible.  Feel free to call the clinic you have any questions or concerns. The clinic phone number is [phone].  Please show the [facility] at check-in to the Emergency Department and triage nurse.   Carboplatin injection What is this medicine? CARBOPLATIN (KAR boe pla tin) is a chemotherapy drug. It targets fast dividing cells, like cancer cells, and causes these cells to die. This medicine is used to treat ovarian cancer and many other cancers. This medicine may be used for other purposes; ask your health care provider or pharmacist if you have questions. What should I tell my health care provider before I take this medicine? They need to know if you have any of these conditions: -blood disorders -hearing problems -kidney disease -recent or ongoing radiation therapy -an unusual or allergic reaction to carboplatin, cisplatin, other chemotherapy, other medicines, foods, dyes, or preservatives -pregnant or trying to get pregnant -breast-feeding How should I use this medicine? This drug is usually given as an infusion into a vein. It is  administered in a hospital or clinic by a specially trained health care professional. Talk to your pediatrician regarding the use of this medicine in children. Special care may be needed. Overdosage: If you think you have taken too much of this medicine contact a poison control center or emergency room at once. NOTE: This medicine is only for you. Do not share this medicine with others. What if I miss a dose? It is important not to miss a dose. Call your doctor or health care professional if you are unable to keep an appointment. What may interact with this medicine? -medicines for seizures -medicines to increase blood counts like filgrastim, pegfilgrastim, sargramostim -some antibiotics like amikacin, gentamicin, neomycin, streptomycin, tobramycin -vaccines Talk to your doctor or health care professional before taking any of these medicines: -acetaminophen -aspirin -ibuprofen -ketoprofen -naproxen This list may not describe all possible interactions. Give your health care provider a list of all the medicines, herbs, non-prescription drugs, or dietary supplements you use. Also tell them if you smoke, drink alcohol, or use illegal drugs. Some items may interact with your medicine. What should I watch for while using this medicine? Your condition will be monitored carefully while you are receiving this medicine. You will need important blood work done while you are taking this medicine. This drug may make you feel generally unwell. This is not uncommon, as chemotherapy can affect healthy cells as well as cancer cells. Report any side effects. Continue your course of treatment even though you feel ill unless your doctor tells you to stop. In some cases, you may be given additional medicines to help with side effects. Follow all directions for  their use. Call your doctor or health care professional for advice if you get a fever, chills or sore throat, or other symptoms of a cold or flu. Do not  treat yourself. This drug decreases your body's ability to fight infections. Try to avoid being around people who are sick. This medicine may increase your risk to bruise or bleed. Call your doctor or health care professional if you notice any unusual bleeding. Be careful brushing and flossing your teeth or using a toothpick because you may get an infection or bleed more easily. If you have any dental work done, tell your dentist you are receiving this medicine. Avoid taking products that contain aspirin, acetaminophen, ibuprofen, naproxen, or ketoprofen unless instructed by your doctor. These medicines may hide a fever. Do not become pregnant while taking this medicine. Women should inform their doctor if they wish to become pregnant or think they might be pregnant. There is a potential for serious side effects to an unborn child. Talk to your health care professional or pharmacist for more information. Do not breast-feed an infant while taking this medicine. What side effects may I notice from receiving this medicine? Side effects that you should report to your doctor or health care professional as soon as possible: -allergic reactions like skin rash, itching or hives, swelling of the face, lips, or tongue -signs of infection - fever or chills, cough, sore throat, pain or difficulty passing urine -signs of decreased platelets or bleeding - bruising, pinpoint red spots on the skin, black, tarry stools, nosebleeds -signs of decreased red blood cells - unusually weak or tired, fainting spells, lightheadedness -breathing problems -changes in hearing -changes in vision -chest pain -high blood pressure -low blood counts - This drug may decrease the number of white blood cells, red blood cells and platelets. You may be at increased risk for infections and bleeding. -nausea and vomiting -pain, swelling, redness or irritation at the injection site -pain, tingling, numbness in the hands or feet -problems  with balance, talking, walking -trouble passing urine or change in the amount of urine Side effects that usually do not require medical attention (report to your doctor or health care professional if they continue or are bothersome): -hair loss -loss of appetite -metallic taste in the mouth or changes in taste This list may not describe all possible side effects. Call your doctor for medical advice about side effects. You may report side effects to FDA at 1-800-FDA-1088. Where should I keep my medicine? This drug is given in a hospital or clinic and will not be stored at home. NOTE: This sheet is a summary. It may not cover all possible information. If you have questions about this medicine, talk to your doctor, pharmacist, or health care provider.    2016, Elsevier/Gold Standard. (2007-04-05 14:38:05)  Paclitaxel injection What is this medicine? PACLITAXEL (PAK li TAX el) is a chemotherapy drug. It targets fast dividing cells, like cancer cells, and causes these cells to die. This medicine is used to treat ovarian cancer, breast cancer, and other cancers. This medicine may be used for other purposes; ask your health care provider or pharmacist if you have questions. What should I tell my health care provider before I take this medicine? They need to know if you have any of these conditions: -blood disorders -irregular heartbeat -infection (especially a virus infection such as chickenpox, cold sores, or herpes) -liver disease -previous or ongoing radiation therapy -an unusual or allergic reaction to paclitaxel, alcohol, polyoxyethylated castor oil, other  chemotherapy agents, other medicines, foods, dyes, or preservatives -pregnant or trying to get pregnant -breast-feeding How should I use this medicine? This drug is given as an infusion into a vein. It is administered in a hospital or clinic by a specially trained health care professional. Talk to your pediatrician regarding the use of  this medicine in children. Special care may be needed. Overdosage: If you think you have taken too much of this medicine contact a poison control center or emergency room at once. NOTE: This medicine is only for you. Do not share this medicine with others. What if I miss a dose? It is important not to miss your dose. Call your doctor or health care professional if you are unable to keep an appointment. What may interact with this medicine? Do not take this medicine with any of the following medications: -disulfiram -metronidazole This medicine may also interact with the following medications: -cyclosporine -diazepam -ketoconazole -medicines to increase blood counts like filgrastim, pegfilgrastim, sargramostim -other chemotherapy drugs like cisplatin, doxorubicin, epirubicin, etoposide, teniposide, vincristine -quinidine -testosterone -vaccines -verapamil Talk to your doctor or health care professional before taking any of these medicines: -acetaminophen -aspirin -ibuprofen -ketoprofen -naproxen This list may not describe all possible interactions. Give your health care provider a list of all the medicines, herbs, non-prescription drugs, or dietary supplements you use. Also tell them if you smoke, drink alcohol, or use illegal drugs. Some items may interact with your medicine. What should I watch for while using this medicine? Your condition will be monitored carefully while you are receiving this medicine. You will need important blood work done while you are taking this medicine. This drug may make you feel generally unwell. This is not uncommon, as chemotherapy can affect healthy cells as well as cancer cells. Report any side effects. Continue your course of treatment even though you feel ill unless your doctor tells you to stop. This medicine can cause serious allergic reactions. To reduce your risk you will need to take other medicine(s) before treatment with this medicine. In some  cases, you may be given additional medicines to help with side effects. Follow all directions for their use. Call your doctor or health care professional for advice if you get a fever, chills or sore throat, or other symptoms of a cold or flu. Do not treat yourself. This drug decreases your body's ability to fight infections. Try to avoid being around people who are sick. This medicine may increase your risk to bruise or bleed. Call your doctor or health care professional if you notice any unusual bleeding. Be careful brushing and flossing your teeth or using a toothpick because you may get an infection or bleed more easily. If you have any dental work done, tell your dentist you are receiving this medicine. Avoid taking products that contain aspirin, acetaminophen, ibuprofen, naproxen, or ketoprofen unless instructed by your doctor. These medicines may hide a fever. Do not become pregnant while taking this medicine. Women should inform their doctor if they wish to become pregnant or think they might be pregnant. There is a potential for serious side effects to an unborn child. Talk to your health care professional or pharmacist for more information. Do not breast-feed an infant while taking this medicine. Men are advised not to father a child while receiving this medicine. This product may contain alcohol. Ask your pharmacist or healthcare provider if this medicine contains alcohol. Be sure to tell all healthcare providers you are taking this medicine. Certain medicines, like metronidazole  and disulfiram, can cause an unpleasant reaction when taken with alcohol. The reaction includes flushing, headache, nausea, vomiting, sweating, and increased thirst. The reaction can last from 30 minutes to several hours. What side effects may I notice from receiving this medicine? Side effects that you should report to your doctor or health care professional as soon as possible: -allergic reactions like skin rash,  itching or hives, swelling of the face, lips, or tongue -low blood counts - This drug may decrease the number of white blood cells, red blood cells and platelets. You may be at increased risk for infections and bleeding. -signs of infection - fever or chills, cough, sore throat, pain or difficulty passing urine -signs of decreased platelets or bleeding - bruising, pinpoint red spots on the skin, black, tarry stools, nosebleeds -signs of decreased red blood cells - unusually weak or tired, fainting spells, lightheadedness -breathing problems -chest pain -high or low blood pressure -mouth sores -nausea and vomiting -pain, swelling, redness or irritation at the injection site -pain, tingling, numbness in the hands or feet -slow or irregular heartbeat -swelling of the ankle, feet, hands Side effects that usually do not require medical attention (report to your doctor or health care professional if they continue or are bothersome): -bone pain -complete hair loss including hair on your head, underarms, pubic hair, eyebrows, and eyelashes -changes in the color of fingernails -diarrhea -loosening of the fingernails -loss of appetite -muscle or joint pain -red flush to skin -sweating This list may not describe all possible side effects. Call your doctor for medical advice about side effects. You may report side effects to FDA at 1-800-FDA-1088. Where should I keep my medicine? This drug is given in a hospital or clinic and will not be stored at home. NOTE: This sheet is a summary. It may not cover all possible information. If you have questions about this medicine, talk to your doctor, pharmacist, or health care provider.    2016, Elsevier/Gold Standard. (2014-08-16 13:02:56)

## 2015-08-08 ENCOUNTER — Ambulatory Visit (HOSPITAL_BASED_OUTPATIENT_CLINIC_OR_DEPARTMENT_OTHER): Payer: BLUE CROSS/BLUE SHIELD

## 2015-08-08 VITALS — BP 144/74 | HR 91 | Temp 97.8°F

## 2015-08-08 DIAGNOSIS — Z5189 Encounter for other specified aftercare: Secondary | ICD-10-CM | POA: Diagnosis not present

## 2015-08-08 DIAGNOSIS — C541 Malignant neoplasm of endometrium: Secondary | ICD-10-CM

## 2015-08-08 MED ORDER — TBO-FILGRASTIM 480 MCG/0.8ML ~~LOC~~ SOSY
480.0000 ug | PREFILLED_SYRINGE | Freq: Once | SUBCUTANEOUS | Status: AC
  Administered 2015-08-08: 480 ug via SUBCUTANEOUS
  Filled 2015-08-08: qty 0.8

## 2015-08-08 NOTE — Patient Instructions (Signed)

## 2015-08-09 ENCOUNTER — Ambulatory Visit (HOSPITAL_BASED_OUTPATIENT_CLINIC_OR_DEPARTMENT_OTHER): Payer: BLUE CROSS/BLUE SHIELD

## 2015-08-09 ENCOUNTER — Ambulatory Visit: Payer: BLUE CROSS/BLUE SHIELD

## 2015-08-09 VITALS — BP 106/54 | HR 79 | Temp 98.8°F | Resp 16

## 2015-08-09 DIAGNOSIS — Z5189 Encounter for other specified aftercare: Secondary | ICD-10-CM | POA: Diagnosis not present

## 2015-08-09 DIAGNOSIS — C541 Malignant neoplasm of endometrium: Secondary | ICD-10-CM

## 2015-08-09 MED ORDER — SODIUM CHLORIDE 0.9% FLUSH
10.0000 mL | INTRAVENOUS | Status: AC | PRN
  Administered 2015-08-09: 10 mL
  Filled 2015-08-09: qty 10

## 2015-08-09 MED ORDER — HEPARIN SOD (PORK) LOCK FLUSH 100 UNIT/ML IV SOLN
500.0000 [IU] | INTRAVENOUS | Status: AC | PRN
  Administered 2015-08-09: 500 [IU]
  Filled 2015-08-09: qty 5

## 2015-08-09 MED ORDER — SODIUM CHLORIDE 0.9 % IV SOLN
INTRAVENOUS | Status: DC
  Administered 2015-08-09: 08:00:00 via INTRAVENOUS

## 2015-08-09 MED ORDER — TBO-FILGRASTIM 480 MCG/0.8ML ~~LOC~~ SOSY
480.0000 ug | PREFILLED_SYRINGE | Freq: Once | SUBCUTANEOUS | Status: AC
  Administered 2015-08-09: 480 ug via SUBCUTANEOUS
  Filled 2015-08-09: qty 0.8

## 2015-08-09 NOTE — Progress Notes (Signed)
Pt is to be given Granix 480 in the Infusion area.

## 2015-08-10 ENCOUNTER — Ambulatory Visit (HOSPITAL_BASED_OUTPATIENT_CLINIC_OR_DEPARTMENT_OTHER): Payer: BLUE CROSS/BLUE SHIELD

## 2015-08-10 VITALS — BP 132/72 | HR 95 | Temp 97.8°F | Resp 18

## 2015-08-10 DIAGNOSIS — C541 Malignant neoplasm of endometrium: Secondary | ICD-10-CM | POA: Diagnosis not present

## 2015-08-10 DIAGNOSIS — D701 Agranulocytosis secondary to cancer chemotherapy: Secondary | ICD-10-CM

## 2015-08-10 MED ORDER — TBO-FILGRASTIM 480 MCG/0.8ML ~~LOC~~ SOSY
480.0000 ug | PREFILLED_SYRINGE | Freq: Once | SUBCUTANEOUS | Status: AC
  Administered 2015-08-10: 480 ug via SUBCUTANEOUS

## 2015-08-10 NOTE — Patient Instructions (Signed)

## 2015-08-12 ENCOUNTER — Ambulatory Visit: Payer: BLUE CROSS/BLUE SHIELD

## 2015-08-18 ENCOUNTER — Other Ambulatory Visit: Payer: Self-pay | Admitting: Oncology

## 2015-08-19 ENCOUNTER — Other Ambulatory Visit (HOSPITAL_BASED_OUTPATIENT_CLINIC_OR_DEPARTMENT_OTHER): Payer: BLUE CROSS/BLUE SHIELD

## 2015-08-19 ENCOUNTER — Ambulatory Visit (HOSPITAL_BASED_OUTPATIENT_CLINIC_OR_DEPARTMENT_OTHER): Payer: BLUE CROSS/BLUE SHIELD | Admitting: Oncology

## 2015-08-19 ENCOUNTER — Encounter: Payer: Self-pay | Admitting: Oncology

## 2015-08-19 ENCOUNTER — Telehealth: Payer: Self-pay | Admitting: Oncology

## 2015-08-19 VITALS — BP 149/75 | HR 98 | Temp 98.4°F | Resp 18 | Ht 59.0 in | Wt 178.0 lb

## 2015-08-19 DIAGNOSIS — D500 Iron deficiency anemia secondary to blood loss (chronic): Secondary | ICD-10-CM | POA: Diagnosis not present

## 2015-08-19 DIAGNOSIS — Z6841 Body Mass Index (BMI) 40.0 and over, adult: Secondary | ICD-10-CM

## 2015-08-19 DIAGNOSIS — C541 Malignant neoplasm of endometrium: Secondary | ICD-10-CM

## 2015-08-19 DIAGNOSIS — Z95828 Presence of other vascular implants and grafts: Secondary | ICD-10-CM

## 2015-08-19 DIAGNOSIS — D701 Agranulocytosis secondary to cancer chemotherapy: Secondary | ICD-10-CM | POA: Diagnosis not present

## 2015-08-19 DIAGNOSIS — E039 Hypothyroidism, unspecified: Secondary | ICD-10-CM

## 2015-08-19 DIAGNOSIS — E669 Obesity, unspecified: Secondary | ICD-10-CM

## 2015-08-19 DIAGNOSIS — T451X5A Adverse effect of antineoplastic and immunosuppressive drugs, initial encounter: Secondary | ICD-10-CM

## 2015-08-19 DIAGNOSIS — I10 Essential (primary) hypertension: Secondary | ICD-10-CM

## 2015-08-19 DIAGNOSIS — D509 Iron deficiency anemia, unspecified: Secondary | ICD-10-CM

## 2015-08-19 LAB — CBC WITH DIFFERENTIAL/PLATELET
BASO%: 1.1 % (ref 0.0–2.0)
Basophils Absolute: 0 10*3/uL (ref 0.0–0.1)
EOS%: 1.4 % (ref 0.0–7.0)
Eosinophils Absolute: 0 10*3/uL (ref 0.0–0.5)
HCT: 35.1 % (ref 34.8–46.6)
HGB: 11.5 g/dL — ABNORMAL LOW (ref 11.6–15.9)
LYMPH#: 1.1 10*3/uL (ref 0.9–3.3)
LYMPH%: 35.4 % (ref 14.0–49.7)
MCH: 27.9 pg (ref 25.1–34.0)
MCHC: 32.8 g/dL (ref 31.5–36.0)
MCV: 85 fL (ref 79.5–101.0)
MONO#: 0.2 10*3/uL (ref 0.1–0.9)
MONO%: 7.9 % (ref 0.0–14.0)
NEUT%: 54.2 % (ref 38.4–76.8)
NEUTROS ABS: 1.7 10*3/uL (ref 1.5–6.5)
Platelets: 204 10*3/uL (ref 145–400)
RBC: 4.13 10*6/uL (ref 3.70–5.45)
RDW: 18.6 % — ABNORMAL HIGH (ref 11.2–14.5)
WBC: 3.1 10*3/uL — AB (ref 3.9–10.3)

## 2015-08-19 LAB — COMPREHENSIVE METABOLIC PANEL
ALT: 41 U/L (ref 0–55)
AST: 31 U/L (ref 5–34)
Albumin: 3.5 g/dL (ref 3.5–5.0)
Alkaline Phosphatase: 126 U/L (ref 40–150)
Anion Gap: 13 mEq/L — ABNORMAL HIGH (ref 3–11)
BUN: 11 mg/dL (ref 7.0–26.0)
CHLORIDE: 105 meq/L (ref 98–109)
CO2: 23 meq/L (ref 22–29)
CREATININE: 0.7 mg/dL (ref 0.6–1.1)
Calcium: 9.8 mg/dL (ref 8.4–10.4)
EGFR: >90 mL/min/{1.73_m2} (ref >90)
GLUCOSE: 117 mg/dL (ref 70–140)
Potassium: 3.6 mEq/L (ref 3.5–5.1)
SODIUM: 141 meq/L (ref 136–145)
TOTAL PROTEIN: 7.2 g/dL (ref 6.4–8.3)
Total Bilirubin: 0.39 mg/dL (ref 0.20–1.20)

## 2015-08-19 NOTE — Progress Notes (Signed)
OFFICE PROGRESS NOTE   August 19, 2015   Physicians: Terrence Dupont Rossi/ Janie Morning, Alver Sorrow, MD/ [facility] Internal Medicine, [name], Arvella Nigh, Dorna Leitz, _Grote  INTERVAL HISTORY:  Patient is seen, alone for visit, in continuing attention to adjuvant treatment for IIIC1 grade 2 endometrioid endometrial carcinoma, having had cycle 3 carbo taxol on 08-07-15 with granix 480 mg x3 beginning on day 2. Plan is for radiation upcoming, then last 3 cycles of chemotherapy.   Patient had severe aches in legs similar to cycle 1 chemo and slight aching in anterior chest on days 3 thru 6 of cycle 3. Aches were some better with ibuprofen. Note she forgot to take claritin thru this cycle, which may have resulted in increased aching. She has had no further rash on LE, likely related to new work uniform then. She had some diarrhea after chemo as previously, improved with imodium, then she felt stronger drinking gatorade. Bowels are now moving normally. She denies peripheral neuropathy. She has had no nausea, fever or symptoms of infection. Bladder ok. No LE swelling. No problems with PAC. Remainder of 10 point Review of Systems negative.   PAC in, flushed 08-07-15 She [place] as of last pm.   [place] Patient had ~ 6 months of post menopausal bleeding, at times heavy, prior to reestablishing with Dr Arvella Nigh. [place] 02-12-15 showed slightly enlarged uterus with multiple intramural fibroids up to 3.5 cm, endometrial thickness 4 mm, polypoid lesion in cavity, ovaries grossly normal. She had hysteroscopic resection of endometrial polyp, with grade 2 endometrial adenocarcinoma. She was seen by gyn oncology on 03-29-15, with uterus large without other findings of concern. She had robotic laparoscopic hysterectomy BSO with bilateral sentinel node sampling and lysis of adhesions by Dr Skeet Latch on 05-02-15. Pathology [phone]) grade 2 endometrial adenocarcinoma with 90%  invasion of myometrium (1.4 cm out of 1.5 cm), no involvement cervix or adnexa, + focal LVSI, 2 benign right sentinel nodes and no left pelvic nodes in specimen; MMR normal. Surgery was uncomplicated, patient DC on post op day 1; she has recovered well from surgery, returning to work after 3 weeks. . She had CT AP 05-24-15 with mild left common iliac adenopathy up to 1.1 cm as well as 0.7 cm low aortocaval node, and a 1.6 x 1.6 cm renal mass posterior lower left kidney indeterminate. PET 06-12-15 had SUV 4.1 at the 10 mm left common iliac node, other nodes too small for PET evaluation; mildly hyperdense lesion lower pole left kidney 1.8 cm. She saw Dr Sondra Come in consultation on 06-06-15. Recommendation from surgical pathology and imaging is for chemotherapy and radiation in sandwich fashion.  First carboplatin taxol given 06-26-15, neutropenic with ANC 0.4 on day 13 cycle 1, gCSF added. She had cycle 3 chemotherapy on 08-07-15.   Objective:  Vital signs in last 24 hours:  BP (!) 149/75 (BP Location: Left Arm, Patient Position: Sitting)   Pulse 98   Temp 98.4 F (36.9 C) (Oral)   Resp 18   Ht _0  (1.499 m)   Wt 178 lb (80.7 kg)   LMP 07/16/2014 Comment: pt states she seen a mild amount of blood( spotting)  SpO2 100%   BMI 35.95 kg/m  Weight up 5 lbs. Obese, not in any apparent discomfort seated in exam room. Alert, oriented and appropriate. Slowly ambulatory due to chronic knee problems, but without assistance.  Alopecia  HEENT:PERRL, sclerae not icteric. Oral mucosa moist without lesions, posterior  pharynx clear.  Neck supple. No JVD.  Lymphatics:no cervical,supraclavicular adenopathy Resp: clear to auscultation bilaterally and normal percussion bilaterally Cardio: regular rate and rhythm. No gallop. GI: abdomen obese, soft, nontender, not obviously distended, no appreciable mass or organomegaly. Normally active bowel sounds.  Musculoskeletal/ Extremities: without pitting edema, cords,  tenderness Neuro: no peripheral neuropathy. Otherwise nonfocal. PSYCH appropriate mood and affect Skin without rash, ecchymosis, petechiae Portacath-without erythema or tenderness  Lab Results:  Results for orders placed or performed in visit on 08/19/15  CBC with Differential  Result Value Ref Range   WBC 3.1 (L) 3.9 - 10.3 10e3/uL   NEUT# 1.7 1.5 - 6.5 10e3/uL   HGB 11.5 (L) 11.6 - 15.9 g/dL   HCT 35.1 34.8 - 46.6 %   Platelets 204 145 - 400 10e3/uL   MCV 85.0 79.5 - 101.0 fL   MCH 27.9 25.1 - 34.0 pg   MCHC 32.8 31.5 - 36.0 g/dL   RBC 4.13 3.70 - 5.45 10e6/uL   RDW 18.6 (H) 11.2 - 14.5 %   lymph# 1.1 0.9 - 3.3 10e3/uL   MONO# 0.2 0.1 - 0.9 10e3/uL   Eosinophils Absolute 0.0 0.0 - 0.5 10e3/uL   Basophils Absolute 0.0 0.0 - 0.1 10e3/uL   NEUT% 54.2 38.4 - 76.8 %   LYMPH% 35.4 14.0 - 49.7 %   MONO% 7.9 0.0 - 14.0 %   EOS% 1.4 0.0 - 7.0 %   BASO% 1.1 0.0 - 2.0 %  Comprehensive metabolic panel  Result Value Ref Range   Sodium 141 136 - 145 mEq/L   Potassium 3.6 3.5 - 5.1 mEq/L   Chloride 105 98 - 109 mEq/L   CO2 23 22 - 29 mEq/L   Glucose 117 70 - 140 mg/dl   BUN 11.0 7.0 - 26.0 mg/dL   Creatinine 0.7 0.6 - 1.1 mg/dL   Total Bilirubin 0.39 0.20 - 1.20 mg/dL   Alkaline Phosphatase 126 40 - 150 U/L   AST 31 5 - 34 U/L   ALT 41 0 - 55 U/L   Total Protein 7.2 6.4 - 8.3 g/dL   Albumin 3.5 3.5 - 5.0 g/dL   Calcium 9.8 8.4 - 10.4 mg/dL   Anion Gap 13 (H) 3 - 11 mEq/L   EGFR >90 >90 ml/min/1.73 m2    CBC reviewed with patient at time of visit  Studies/Results:  No results found.  Medications: I have reviewed the patient's current medications. She forgot to use claritin after cycle 3 chemo, which may have resulted in increased aching.  Prn imodium helpful with loose stools just after chemo. Continue oral potassium  DISCUSSION Interval history reviewed, including taxol/ granix aches.  Medications discussed. She does not need more granix today.   Plan of treatment  discussed. Message sent to radiation oncology for scheduling back there, also med onc schedulers to follow up. I will see her back in Sept, which should be ~ mid to late radiation course. She understands that we plan 3 additional cycles of chemotherapy after radiation completes ,then will do restaging scans and have follow up with gyn oncology.   Assessment/Plan:  1.IIIC1 grade 2 endometrioid endometrial carcinoma: post robotic assisted laparoscopic hysterectomy BSO and right pelvic sentinel nodes, with invasion 90% of myometrium and CT/PET evidence of left common iliac node involvement. Plan total 6 cycles of carboplatin taxol chemotherapy in sandwich fashion with RT. Cycle 1 given 06-26-15, chemo neutropenia day 13, granix added.  Cycle 3 chemo given 07-28-15, counts ok today. Referral  back to Dr Sondra Come, then complete last 3 cycles of same chemo after external beam radiation. She will need gCSF and claritin with subsequent chemo treatments due to documented chemo neutropenia and taxol aches.  2.Iron deficiency related to 6+ months of vaginal bleeding: continue oral iron as ferrous fumarate 3.questionable mass left kidney and right nephrolithiasis: UA negative for blood 07-08-15. May need urology input for left kidney after present treatment completes 4.hypothyroidism on medication, monitored by primary care 5.obesity, BMI 35. Exercise limited by chronic problems knees 6. Cholelithiasis, not presently symptomatic 7.diverticulitis and hemorrhoids by colonoscopy 2007. She is due repeat colonoscopy, likely best to wait on this also until present treatment completed 8.minimal past tobacco 9.HTN, elevated lipids. Good EF 2012 10.EMR mention of OSA 11.PAC placed by IR 12. Skin rash resolved, seems related to new work uniform.   All questions answered. Message to radiation oncology re appointment there. She knows to call if needed prior to next scheduled visit. Time spent 25 min including >50% counseling  and coordination of care. Cc Dr Sondra Come    Evlyn Clines, MD   08/19/2015, 9:34 AM

## 2015-08-19 NOTE — Telephone Encounter (Signed)
per pof to sch pt appt-cld radiation to adv when #chemo was per r [place]-they wil cakl pt to sch appt-gave copy of avs/ca

## 2015-08-20 DIAGNOSIS — Z95828 Presence of other vascular implants and grafts: Secondary | ICD-10-CM | POA: Insufficient documentation

## 2015-08-21 ENCOUNTER — Ambulatory Visit
Admission: RE | Admit: 2015-08-21 | Discharge: 2015-08-21 | Disposition: A | Discharge status: Home / Self Care | Payer: BLUE CROSS/BLUE SHIELD | Source: Ambulatory Visit | Attending: Radiation Oncology | Admitting: Radiation Oncology

## 2015-08-21 VITALS — BP 140/95 | HR 95 | Temp 97.9°F | Ht 59.0 in | Wt 175.6 lb

## 2015-08-21 DIAGNOSIS — C541 Malignant neoplasm of endometrium: Secondary | ICD-10-CM | POA: Diagnosis not present

## 2015-08-21 DIAGNOSIS — M1990 Unspecified osteoarthritis, unspecified site: Secondary | ICD-10-CM | POA: Diagnosis not present

## 2015-08-21 DIAGNOSIS — G4733 Obstructive sleep apnea (adult) (pediatric): Secondary | ICD-10-CM | POA: Diagnosis not present

## 2015-08-21 DIAGNOSIS — R011 Cardiac murmur, unspecified: Secondary | ICD-10-CM | POA: Diagnosis not present

## 2015-08-21 DIAGNOSIS — I10 Essential (primary) hypertension: Secondary | ICD-10-CM | POA: Diagnosis not present

## 2015-08-21 DIAGNOSIS — E785 Hyperlipidemia, unspecified: Secondary | ICD-10-CM | POA: Diagnosis not present

## 2015-08-21 DIAGNOSIS — E039 Hypothyroidism, unspecified: Secondary | ICD-10-CM | POA: Diagnosis not present

## 2015-08-21 DIAGNOSIS — K219 Gastro-esophageal reflux disease without esophagitis: Secondary | ICD-10-CM | POA: Diagnosis not present

## 2015-08-21 NOTE — Progress Notes (Signed)
Please see the Nurse Progress Note in the MD Initial Consult Encounter for this patient. 

## 2015-08-21 NOTE — Progress Notes (Signed)
Lydia Walsh here for follow up.  She reports having pain in her right foot.  She broke her 4th toe 3 weeks ago and is wearing a walking boot.  She denies having any bladder issues, bowel issues, vaginal rectal/bleeding or discharge.  She reports that her appetite and energy level is improving.  She had her third cycle of chemotherapy on 08/07/15.  BP (!) 140/95 (BP Location: Left Arm, Patient Position: Sitting)   Pulse 95   Temp 97.9 F (36.6 C) (Oral)   Ht 4\' 11"  (1.499 m)   Wt 175 lb 9.6 oz (79.7 kg)   LMP 07/16/2014 Comment: pt states she seen a mild amount of blood( spotting)  SpO2 100%   BMI 35.47 kg/m    Wt Readings from Last 3 Encounters:  08/21/15 175 lb 9.6 oz (79.7 kg)  08/19/15 178 lb (80.7 kg)  07/25/15 173 lb 4.8 oz (78.6 kg)

## 2015-08-21 NOTE — Progress Notes (Signed)
Radiation Oncology         [phone] ________________________________  Name: Lydia Walsh MRN: [id number]  Date: 08/21/2015  DOB: [birth year]  Follow-Up Visit Note  CC: Dellia Nims, MD  Gordy Levan, MD    ICD-9-CM ICD-10-CM   1. [employer] Federation 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 ([place]) 182.0 C54.1   2. Endometrial cancer (HCC) 182.0 C54.1     Diagnosis:  Stage IIIC-1 Grade 2 endometrioid endometrial cancer  Narrative:  The patient returns today for Planning of her radiation therapy. The patient has recently completed her third cycle of adjuvant chemotherapy (carbo taxol on 08-07-15 with granix 480 mg x3 beginning on day 2.)  . She denies any pelvic pain low back pain vaginal bleeding or rectal bleeding or hematuria.  She does have pain in her right foot from recent fracture of one of her toes.                       ALLERGIES:  is allergic to hctz [hydrochlorothiazide].  Meds: Current Outpatient Prescriptions  Medication Sig Dispense Refill  . allopurinol (ZYLOPRIM) 100 MG tablet TAKE ONE TABLET BY MOUTH ONCE DAILY 30 tablet 0  . amLODipine (NORVASC) 10 MG tablet TAKE ONE TABLET BY MOUTH ONCE DAILY 30 tablet 0  . Ferrous Fumarate (HEMOCYTE - 106 MG FE) 324 (106 Fe) MG TABS tablet Take 1 tablet (106 mg of iron total) by mouth daily. On an empty stomach with OJ 30 tablet 4  . ibuprofen (ADVIL,MOTRIN) 200 MG tablet Take 200 mg by mouth every 6 (six) hours as needed for mild pain. Reported on 07/15/2015    . levothyroxine (SYNTHROID, LEVOTHROID) 112 MCG tablet Take 1 tablet (112 mcg total) by mouth daily before breakfast. 90 tablet 0  . lidocaine-prilocaine (EMLA) cream Apply to port 1-2 hours before accessing with a needle. Cover with plastic wrap. 30 g 1  . simvastatin (ZOCOR) 20 MG tablet TAKE ONE TABLET BY MOUTH IN THE EVENING 30 tablet 0  . spironolactone (ALDACTONE) 25 MG tablet Take 25 mg by mouth daily.  3  . vitamin E  (VITAMIN E) 400 UNIT capsule Take 400 Units by mouth daily.    Marland Kitchen dexamethasone (DECADRON) 4 MG tablet Take 5 tablets with food 12 hrs and 6 hrs before taxol (Patient not taking: Reported on 08/19/2015) 10 tablet 1  . diphenoxylate-atropine (LOMOTIL) 2.5-0.025 MG tablet Take 2 tablets by mouth 4 (four) times daily as needed for diarrhea or loose stools. (Patient not taking: Reported on 08/21/2015) 30 tablet 0  . docusate sodium (COLACE) 100 MG capsule Take 1 capsule (100 mg total) by mouth 2 (two) times daily. (Patient not taking: Reported on 08/19/2015) 30 capsule 0  . HYDROcodone-acetaminophen (NORCO/VICODIN) 5-325 MG tablet Take 1 tablet by mouth every 4 (four) hours as needed for severe pain. (Patient not taking: Reported on 08/19/2015) 10 tablet 0  . LORazepam (ATIVAN) 0.5 MG tablet Place 1 tablet under the tongue or swallow every 6 hours as needed for nausea. Will make sleepy. (Patient not taking: Reported on 08/19/2015) 20 tablet 0  . ondansetron (ZOFRAN) 8 MG tablet Take 1 tablet (8 mg total) by mouth every 8 (eight) hours as needed for nausea. Will make drowsy. (Patient not taking: Reported on 08/19/2015) 30 tablet 1   No current facility-administered medications for this encounter.     Physical Findings: The patient is in no acute distress. Patient is alert and oriented.  height is 4\' 11"  (1.499 m) and weight is 175 lb 9.6 oz (79.7 kg). Her oral temperature is 97.9 F (36.6 C). Her blood pressure is 140/95 (abnormal) and her pulse is 95. Her oxygen saturation is 100%. .  No palpable supraclavicular or axillary adenopathy. Lungs are clear to auscultation. The heart has a regular rhythm and rate. The abdomen is soft and nontender with normal bowel sounds. On pelvic examination the external genitalia are unremarkable. A speculum exam is performed. No mucosal lesions noted in the vaginal vault area.  On bimanual examination no pelvic masses are appreciated. The vaginal cuff was noted to be intact  Lab  Findings: Lab Results  Component Value Date   WBC 3.1 (L) 08/19/2015   HGB 11.5 (L) 08/19/2015   HCT 35.1 08/19/2015   MCV 85.0 08/19/2015   PLT 204 08/19/2015    Radiographic Findings: Dg Foot Complete Right  Result Date: 07/31/2015 CLINICAL DATA:  Initial encounter for Per pt: stumped the right foot last night on a table leg, injuring the right foot. Pain is right foot, 4th toe. No prior injury to the right foot. Patient is not a diabetic. EXAM: RIGHT FOOT COMPLETE - 3+ VIEW COMPARISON:  02/27/2004 FINDINGS: Progression of moderate to marked osteoarthritis involving the first metatarsal phalangeal joint. Minimally displaced, oblique fracture involving the proximal phalanx of the fourth. No intra-articular extension. IMPRESSION: Fourth proximal phalangeal fracture. 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 Electronically Signed   By: Abigail Miyamoto M.D.   On: 07/31/2015 12:36    Impression: Stage IIIC-1 Grade 2 endometrioid endometrial cancer. Patient is now ready to proceed with pelvic radiation therapy as part of her adjuvant treatment. Patient's tumor was deeply invasive into the myometrium and did show lymphovascular space invasion. In light of this I would also recommend vaginal cuff brachytherapy as part of her treatment. I discussed treatment course side effects and potential toxicities of radiation therapy for this patient in detail. Patient appears to understand and wishes to proceed with planned course of treatment.  Plan:  Simulation and planning tomorrow with treatments to begin in approximately 1-1/2 weeks. Patient will receive 45 gray to the pelvis and low periaortic area followed by 3 intracavitary brachytherapy treatments.  ____________________________________ Gery Pray, MD

## 2015-08-22 ENCOUNTER — Encounter: Payer: Self-pay | Admitting: Radiation Oncology

## 2015-08-22 ENCOUNTER — Ambulatory Visit
Admission: RE | Admit: 2015-08-22 | Discharge: 2015-08-22 | Disposition: A | Discharge status: Home / Self Care | Payer: BLUE CROSS/BLUE SHIELD | Source: Ambulatory Visit | Attending: Radiation Oncology | Admitting: Radiation Oncology

## 2015-08-22 VITALS — BP 146/76 | HR 88 | Temp 98.5°F | Resp 16 | Ht 59.0 in | Wt 174.0 lb

## 2015-08-22 DIAGNOSIS — K219 Gastro-esophageal reflux disease without esophagitis: Secondary | ICD-10-CM | POA: Diagnosis not present

## 2015-08-22 DIAGNOSIS — E039 Hypothyroidism, unspecified: Secondary | ICD-10-CM | POA: Diagnosis not present

## 2015-08-22 DIAGNOSIS — E785 Hyperlipidemia, unspecified: Secondary | ICD-10-CM | POA: Diagnosis not present

## 2015-08-22 DIAGNOSIS — Z95828 Presence of other vascular implants and grafts: Secondary | ICD-10-CM

## 2015-08-22 DIAGNOSIS — C541 Malignant neoplasm of endometrium: Secondary | ICD-10-CM

## 2015-08-22 DIAGNOSIS — G4733 Obstructive sleep apnea (adult) (pediatric): Secondary | ICD-10-CM | POA: Diagnosis not present

## 2015-08-22 DIAGNOSIS — I10 Essential (primary) hypertension: Secondary | ICD-10-CM | POA: Diagnosis not present

## 2015-08-22 DIAGNOSIS — M1990 Unspecified osteoarthritis, unspecified site: Secondary | ICD-10-CM | POA: Diagnosis not present

## 2015-08-22 DIAGNOSIS — R011 Cardiac murmur, unspecified: Secondary | ICD-10-CM | POA: Diagnosis not present

## 2015-08-22 MED ORDER — SODIUM CHLORIDE 0.9 % IJ SOLN
10.0000 mL | INTRAMUSCULAR | Status: DC | PRN
Start: 2015-08-22 — End: 2015-08-22

## 2015-08-22 MED ORDER — SODIUM CHLORIDE 0.9 % IJ SOLN: 10.0000 mL | Freq: Once | INTRAMUSCULAR | Status: DC

## 2015-08-22 MED ORDER — SODIUM CHLORIDE 0.9 % IJ SOLN: 10.0000 mL | INTRAMUSCULAR | Status: DC | PRN

## 2015-08-22 MED ORDER — HEPARIN SOD (PORK) LOCK FLUSH 100 UNIT/ML IV SOLN
500.0000 [IU] | Freq: Once | INTRAVENOUS | Status: AC
Start: 2015-08-22 — End: 2015-08-22
  Administered 2015-08-22: 500 [IU] via INTRAVENOUS

## 2015-08-22 NOTE — Progress Notes (Signed)
Does patient have an allergy to IV contrast dye?: No.   Has patient ever received premedication for IV contrast dye?: No.   Does patient take metformin?: No.  Date of lab work: August 19, 2015 BUN: 11.0   CR: 0.7  IV site: Right side Port a cath, condition patent and no redness, excellent blood return.  Flushed with NS IV site discontinued, flushed with NS and Heparin, pt tolerated well, excellent blood return.  Site warm dry and intact.     LMP 07/16/2014 Comment: pt states she seen a mild amount of blood( spotting)

## 2015-08-26 NOTE — Progress Notes (Signed)
  Radiation Oncology         (336) [phone] ________________________________  Name: Lydia Walsh MRN: [id number]  Date: 08/22/2015  DOB: [birth year]  SIMULATION AND TREATMENT PLANNING NOTE    ICD-9-CM ICD-10-CM   1. Endometrial cancer ([place]) 182.0 C54.1     DIAGNOSIS:  Stage IIIC-1 Grade 2 endometrioid endometrial cancer  NARRATIVE:  The patient was brought to the [place].  Identity was confirmed.  All relevant records and images related to the planned course of therapy were reviewed.  The patient freely provided informed written consent to proceed with treatment after reviewing the details related to the planned course of therapy. The consent form was witnessed and verified by the simulation staff.  Then, the patient was set-up in a stable reproducible  supine position for radiation therapy.  CT images were obtained.  Surface markings were placed.  The CT images were loaded into the planning software.  Then the target and avoidance structures were contoured.  Treatment planning then occurred.  The radiation prescription was entered and confirmed.  Then, I designed and supervised the construction of a total of 1 medically necessary complex treatment devices.  I have requested : Intensity Modulated Radiotherapy (IMRT) is medically necessary for this case for the following reason:  Small bowel sparing..  I have ordered:dose calc.  PLAN:  The patient will receive 45 Gy in 25 fractions directed at the pelvis and low periaortic area followed by treatments directed at the vaginal cuff using high-dose rate brachytherapy with iridium 192. The patient will receive 3 high-dose rate treatments.  -----------------------------------  Blair Promise, PhD, MD

## 2015-08-29 DIAGNOSIS — I10 Essential (primary) hypertension: Secondary | ICD-10-CM | POA: Diagnosis not present

## 2015-08-29 DIAGNOSIS — G4733 Obstructive sleep apnea (adult) (pediatric): Secondary | ICD-10-CM | POA: Diagnosis not present

## 2015-08-29 DIAGNOSIS — R011 Cardiac murmur, unspecified: Secondary | ICD-10-CM | POA: Diagnosis not present

## 2015-08-29 DIAGNOSIS — E039 Hypothyroidism, unspecified: Secondary | ICD-10-CM | POA: Diagnosis not present

## 2015-08-29 DIAGNOSIS — E785 Hyperlipidemia, unspecified: Secondary | ICD-10-CM | POA: Diagnosis not present

## 2015-08-29 DIAGNOSIS — C541 Malignant neoplasm of endometrium: Secondary | ICD-10-CM | POA: Diagnosis not present

## 2015-08-29 DIAGNOSIS — M1990 Unspecified osteoarthritis, unspecified site: Secondary | ICD-10-CM | POA: Diagnosis not present

## 2015-08-29 DIAGNOSIS — K219 Gastro-esophageal reflux disease without esophagitis: Secondary | ICD-10-CM | POA: Diagnosis not present

## 2015-08-30 DIAGNOSIS — H40013 Open angle with borderline findings, low risk, bilateral: Secondary | ICD-10-CM | POA: Diagnosis not present

## 2015-08-30 DIAGNOSIS — H04123 Dry eye syndrome of bilateral lacrimal glands: Secondary | ICD-10-CM | POA: Diagnosis not present

## 2015-08-30 DIAGNOSIS — H43813 Vitreous degeneration, bilateral: Secondary | ICD-10-CM | POA: Diagnosis not present

## 2015-08-30 DIAGNOSIS — H2513 Age-related nuclear cataract, bilateral: Secondary | ICD-10-CM | POA: Diagnosis not present

## 2015-09-02 ENCOUNTER — Ambulatory Visit
Admission: RE | Admit: 2015-09-02 | Discharge: 2015-09-02 | Disposition: A | Discharge status: Home / Self Care | Payer: BLUE CROSS/BLUE SHIELD | Source: Ambulatory Visit | Attending: Radiation Oncology | Admitting: Radiation Oncology

## 2015-09-02 DIAGNOSIS — I10 Essential (primary) hypertension: Secondary | ICD-10-CM | POA: Diagnosis not present

## 2015-09-02 DIAGNOSIS — E785 Hyperlipidemia, unspecified: Secondary | ICD-10-CM | POA: Diagnosis not present

## 2015-09-02 DIAGNOSIS — R011 Cardiac murmur, unspecified: Secondary | ICD-10-CM | POA: Diagnosis not present

## 2015-09-02 DIAGNOSIS — G4733 Obstructive sleep apnea (adult) (pediatric): Secondary | ICD-10-CM | POA: Diagnosis not present

## 2015-09-02 DIAGNOSIS — E039 Hypothyroidism, unspecified: Secondary | ICD-10-CM | POA: Diagnosis not present

## 2015-09-02 DIAGNOSIS — M1990 Unspecified osteoarthritis, unspecified site: Secondary | ICD-10-CM | POA: Diagnosis not present

## 2015-09-02 DIAGNOSIS — K219 Gastro-esophageal reflux disease without esophagitis: Secondary | ICD-10-CM | POA: Diagnosis not present

## 2015-09-02 DIAGNOSIS — C541 Malignant neoplasm of endometrium: Secondary | ICD-10-CM | POA: Diagnosis not present

## 2015-09-03 ENCOUNTER — Encounter: Payer: Self-pay | Admitting: Radiation Oncology

## 2015-09-03 ENCOUNTER — Ambulatory Visit (INDEPENDENT_AMBULATORY_CARE_PROVIDER_SITE_OTHER): Payer: BLUE CROSS/BLUE SHIELD | Admitting: Internal Medicine

## 2015-09-03 ENCOUNTER — Other Ambulatory Visit: Payer: Self-pay

## 2015-09-03 ENCOUNTER — Ambulatory Visit
Admission: RE | Admit: 2015-09-03 | Discharge: 2015-09-03 | Disposition: A | Discharge status: Home / Self Care | Payer: BLUE CROSS/BLUE SHIELD | Source: Ambulatory Visit | Attending: Radiation Oncology | Admitting: Radiation Oncology

## 2015-09-03 VITALS — BP 140/76 | HR 87 | Temp 98.2°F | Ht 60.0 in | Wt 177.4 lb

## 2015-09-03 VITALS — BP 147/81 | HR 84 | Temp 98.6°F | Ht 59.0 in | Wt 176.4 lb

## 2015-09-03 DIAGNOSIS — G4733 Obstructive sleep apnea (adult) (pediatric): Secondary | ICD-10-CM | POA: Diagnosis not present

## 2015-09-03 DIAGNOSIS — C541 Malignant neoplasm of endometrium: Secondary | ICD-10-CM

## 2015-09-03 DIAGNOSIS — R1319 Other dysphagia: Secondary | ICD-10-CM

## 2015-09-03 DIAGNOSIS — R011 Cardiac murmur, unspecified: Secondary | ICD-10-CM | POA: Diagnosis not present

## 2015-09-03 DIAGNOSIS — R197 Diarrhea, unspecified: Secondary | ICD-10-CM

## 2015-09-03 DIAGNOSIS — E039 Hypothyroidism, unspecified: Secondary | ICD-10-CM | POA: Diagnosis not present

## 2015-09-03 DIAGNOSIS — E785 Hyperlipidemia, unspecified: Secondary | ICD-10-CM | POA: Diagnosis not present

## 2015-09-03 DIAGNOSIS — M1990 Unspecified osteoarthritis, unspecified site: Secondary | ICD-10-CM | POA: Diagnosis not present

## 2015-09-03 DIAGNOSIS — K219 Gastro-esophageal reflux disease without esophagitis: Secondary | ICD-10-CM | POA: Diagnosis not present

## 2015-09-03 DIAGNOSIS — R1310 Dysphagia, unspecified: Secondary | ICD-10-CM | POA: Insufficient documentation

## 2015-09-03 DIAGNOSIS — I10 Essential (primary) hypertension: Secondary | ICD-10-CM | POA: Diagnosis not present

## 2015-09-03 MED ORDER — DIPHENOXYLATE-ATROPINE 2.5-0.025 MG PO TABS: 2.0000 | ORAL_TABLET | Freq: Four times a day (QID) | ORAL | 0 refills | Status: DC | PRN

## 2015-09-03 MED ORDER — OMEPRAZOLE 20 MG PO CPDR: 20.0000 mg | DELAYED_RELEASE_CAPSULE | Freq: Every day | ORAL | 2 refills | Status: DC

## 2015-09-03 NOTE — Patient Instructions (Signed)
For you difficultly swallowing and sensation of food getting stuck in your throat, we will refer you to gastroenterology in order to look at your esophagus. Please take Omeprazole 20 mg once a day in the morning.

## 2015-09-03 NOTE — Progress Notes (Signed)
Lydia Walsh has completed 2 fractions to her pelvis.  She reports having cramping pain in the back of her legs.  She said this has been going on for awhile.  She denies having any new bladder issues or vaginal bleeding.  She does report having diarrhea and has been taking lomotil.  She is wondering if she can get a refill.    BP (!) 147/81 (BP Location: Right Arm, Patient Position: Sitting)   Pulse 84   Temp 98.6 F (37 C) (Oral)   Ht 4\' 11"  (1.499 m)   Wt 176 lb 6.4 oz (80 kg)   LMP 07/16/2014 Comment: pt states she seen a mild amount of blood( spotting)  SpO2 100%   BMI 35.63 kg/m    Wt Readings from Last 3 Encounters:  09/03/15 176 lb 6.4 oz (80 kg)  08/22/15 174 lb (78.9 kg)  08/21/15 175 lb 9.6 oz (79.7 kg)

## 2015-09-03 NOTE — Progress Notes (Signed)
Pt here for patient teaching.  Pt given Radiation and You booklet. Reviewed areas of pertinence such as diarrhea, fatigue, skin changes, urinary and bladder changes, cough, shortness of breath, earaches and taste changes . Pt able to give teach back of to pat skin, use unscented/gentle soap, use baby wipes and drink plenty of water,avoid applying anything to skin within 4 hours of treatment. Pt demonstrated understanding and verbalizes understanding of information given and will contact nursing with any questions or concerns.

## 2015-09-03 NOTE — Progress Notes (Signed)
  Radiation Oncology         (336) [phone] ________________________________  Name: Lydia Walsh MRN: [id number]  Date: 09/03/2015  DOB: 1955-07-26  Weekly Radiation Therapy Management      ICD-9-CM ICD-10-CM   1. Endometrial cancer (HCC) 182.0 C54.1   2. Diarrhea, unspecified type 787.91 R19.7      Stage IIIC-1 Grade 2 endometrioid endometrial cancer    Current Dose: 4 Gy     Planned Dose:  50 Gy  Narrative . . . . . . . . The patient presents for routine under treatment assessment.                                   The patient is without complaint. Lydia Walsh has completed 2 fractions to her pelvis.  She reports having cramping pain in the back of her legs.  She said this has been going on for awhile.  She denies having any new bladder issues or vaginal bleeding.  She does report having diarrhea and has been taking lomotil.  She is wondering if she can get a refill.                                    Set-up films were reviewed.                                 The chart was checked. Physical Findings. . .  height is 4\' 11"  (1.499 m) and weight is 176 lb 6.4 oz (80 kg). Her oral temperature is 98.6 F (37 C). Her blood pressure is 147/81 (abnormal) and her pulse is 84. Her oxygen saturation is 100%. . Weight essentially stable.  No significant changes. Lungs are clear to auscultation bilaterally. Heart has regular rate and rhythm. No palpable cervical, supraclavicular, or axillary adenopathy. Abdomen soft, non-tender, normal bowel sounds.  Impression . . . . . . . The patient is tolerating radiation. Plan . . . . . . . . . . . . Continue treatment as planned. I told the patient that she could also take imodium for her diarrhea. I refilled her prescription as well for lomotil.   ________________________________   Blair Promise, PhD, MD  This document serves as a record of services personally performed by Gery Pray, MD. It was created on his behalf by Truddie Hidden, a  trained medical scribe. The creation of this record is based on the scribe's personal observations and the provider's statements to them. This document has been checked and approved by the attending provider.

## 2015-09-03 NOTE — Progress Notes (Signed)
    CC: "food gets stuck in my throat"  HPI: [name] is a 60 y.o. female with PMHx of GERD, endometrial cancer, and hypothyroidism who presents to the clinic for complaint of food getting stuck in her throat. Patient states this first started one month ago. She denies trouble initiating a swallow or associated cough, chocking or nasal regurgitation associating with swallowing. She feels that food gets stuck in her esophagus a few seconds after swallowing. It primarily occurs with solids and not with liquids. It has been constant and stable over the last one month- not worsening or improving. She denies significant weight loss. She admits to chronic heartburn which as been worse in the last several months as she must lay flat for her radiation therapy. She has been taking famotidine as needed, but not daily. She denies history of tobacco or alcohol use. She denies halitosis.    Past Medical History:  Diagnosis Date  . [facility])    endometrial cancer  . Endometrial polyp   . GERD (gastroesophageal reflux disease)   . Heart murmur   . History of uterine fibroid   . Hyperlipidemia   . Hypertension   . Hypothyroidism   . OSA (obstructive sleep apnea)    moderate OSA per study 06-21-2005--  per pt does need cpap anymore never purchased it  . Osteoarthritis   . PMB (postmenopausal bleeding)   . Wears dentures    upper  . Wears glasses    Review of Systems: Please see pertinent ROS reviewed in HPI and problem based charting.  General: Denies unexpected weight loss, change in appetite.  HEENT: Admits to dysphagia. Cardiovascular: Denies chest pain  Gastrointestinal: Denies nausea, vomiting, abdominal pain  Physical Exam: Vitals:   09/03/15 1537  BP: 140/76  Pulse: 87  Temp: 98.2 F (36.8 C)  TempSrc: Oral  SpO2: 99%  Weight: 177 lb 6.4 oz (80.5 kg)  Height: 5' (1.524 m)   General: Vital signs reviewed.  Patient is well-developed and well-nourished, in no acute  distress and cooperative with exam.  Oral: Normal posterior oropharynx Neck: Supple, trachea midline, normal ROM, no masses, no thyromegaly, no stridor or carotid bruit present.  Cardiovascular: RRR, S1 normal, S2 normal, no murmurs, gallops, or rubs. Pulmonary/Chest: Clear to auscultation bilaterally, no wheezes, rales, or rhonchi. Skin: Warm, dry and intact. No rashes or erythema. Psychiatric: Normal mood and affect. speech and behavior is normal. Cognition and memory are grossly normal.   Assessment & Plan:  See encounters tab for problem based medical decision making. Patient discussed with Dr. Daryll Drown

## 2015-09-03 NOTE — Assessment & Plan Note (Addendum)
Lydia Walsh is a 60 y.o. female with PMHx of GERD, endometrial cancer, and hypothyroidism who presents to the clinic for complaint of food getting stuck in her throat. Patient states this first started one month ago. She denies trouble initiating a swallow or associated cough, chocking or nasal regurgitation associating with swallowing. She feels that food gets stuck in her esophagus a few seconds after swallowing. It primarily occurs with solids and not with liquids. It has been constant and stable over the last one month- not worsening or improving. She denies significant weight loss. She admits to chronic heartburn which as been worse in the last several months as she must lay flat for her radiation therapy. She has been taking famotidine as needed, but not daily. She denies history of tobacco or alcohol use. She denies halitosis.    Assessment: Esophageal Dysphagia. Causes include esophageal ring/web versus peptic stricture due to chronic uncontrolled GERD. Doubt esophageal cancer.  Plan: -Referral to GI for EGD and possible dilation -Omeprazole 20 mg QAM  -If progressive and unable to see GI quickly, we will perform a barium swallow

## 2015-09-04 ENCOUNTER — Ambulatory Visit
Admission: RE | Admit: 2015-09-04 | Discharge: 2015-09-04 | Disposition: A | Discharge status: Home / Self Care | Payer: BLUE CROSS/BLUE SHIELD | Source: Ambulatory Visit | Attending: Radiation Oncology | Admitting: Radiation Oncology

## 2015-09-04 DIAGNOSIS — Z510 Encounter for antineoplastic radiation therapy: Secondary | ICD-10-CM | POA: Insufficient documentation

## 2015-09-04 DIAGNOSIS — Z888 Allergy status to other drugs, medicaments and biological substances status: Secondary | ICD-10-CM | POA: Insufficient documentation

## 2015-09-04 DIAGNOSIS — C541 Malignant neoplasm of endometrium: Secondary | ICD-10-CM | POA: Diagnosis not present

## 2015-09-04 DIAGNOSIS — Z79899 Other long term (current) drug therapy: Secondary | ICD-10-CM | POA: Diagnosis not present

## 2015-09-04 MED ORDER — SIMVASTATIN 20 MG PO TABS: 20.0000 mg | ORAL_TABLET | Freq: Every evening | ORAL | 11 refills | Status: DC

## 2015-09-05 ENCOUNTER — Ambulatory Visit
Admission: RE | Admit: 2015-09-05 | Discharge: 2015-09-05 | Disposition: A | Discharge status: Home / Self Care | Payer: BLUE CROSS/BLUE SHIELD | Source: Ambulatory Visit | Attending: Radiation Oncology | Admitting: Radiation Oncology

## 2015-09-05 DIAGNOSIS — Z79899 Other long term (current) drug therapy: Secondary | ICD-10-CM | POA: Diagnosis not present

## 2015-09-05 DIAGNOSIS — C541 Malignant neoplasm of endometrium: Secondary | ICD-10-CM | POA: Diagnosis not present

## 2015-09-05 DIAGNOSIS — Z510 Encounter for antineoplastic radiation therapy: Secondary | ICD-10-CM | POA: Diagnosis not present

## 2015-09-05 DIAGNOSIS — Z888 Allergy status to other drugs, medicaments and biological substances status: Secondary | ICD-10-CM | POA: Diagnosis not present

## 2015-09-06 ENCOUNTER — Ambulatory Visit
Admission: RE | Admit: 2015-09-06 | Discharge: 2015-09-06 | Disposition: A | Discharge status: Home / Self Care | Payer: BLUE CROSS/BLUE SHIELD | Source: Ambulatory Visit | Attending: Radiation Oncology | Admitting: Radiation Oncology

## 2015-09-06 DIAGNOSIS — C541 Malignant neoplasm of endometrium: Secondary | ICD-10-CM | POA: Diagnosis not present

## 2015-09-06 DIAGNOSIS — Z510 Encounter for antineoplastic radiation therapy: Secondary | ICD-10-CM | POA: Diagnosis not present

## 2015-09-06 DIAGNOSIS — Z79899 Other long term (current) drug therapy: Secondary | ICD-10-CM | POA: Diagnosis not present

## 2015-09-06 DIAGNOSIS — Z888 Allergy status to other drugs, medicaments and biological substances status: Secondary | ICD-10-CM | POA: Diagnosis not present

## 2015-09-06 NOTE — Progress Notes (Signed)
Internal Medicine Clinic Attending  Case discussed with Dr. Burns soon after the resident saw the patient.  We reviewed the resident's history and exam and pertinent patient test results.  I agree with the assessment, diagnosis, and plan of care documented in the resident's note. 

## 2015-09-09 ENCOUNTER — Ambulatory Visit
Admission: RE | Admit: 2015-09-09 | Discharge: 2015-09-09 | Disposition: A | Discharge status: Home / Self Care | Payer: BLUE CROSS/BLUE SHIELD | Source: Ambulatory Visit | Attending: Radiation Oncology | Admitting: Radiation Oncology

## 2015-09-09 DIAGNOSIS — C541 Malignant neoplasm of endometrium: Secondary | ICD-10-CM | POA: Diagnosis not present

## 2015-09-09 DIAGNOSIS — Z79899 Other long term (current) drug therapy: Secondary | ICD-10-CM | POA: Diagnosis not present

## 2015-09-09 DIAGNOSIS — Z510 Encounter for antineoplastic radiation therapy: Secondary | ICD-10-CM | POA: Diagnosis not present

## 2015-09-09 DIAGNOSIS — Z888 Allergy status to other drugs, medicaments and biological substances status: Secondary | ICD-10-CM | POA: Diagnosis not present

## 2015-09-10 ENCOUNTER — Ambulatory Visit
Admission: RE | Admit: 2015-09-10 | Discharge: 2015-09-10 | Disposition: A | Discharge status: Home / Self Care | Payer: BLUE CROSS/BLUE SHIELD | Source: Ambulatory Visit | Attending: Radiation Oncology | Admitting: Radiation Oncology

## 2015-09-10 ENCOUNTER — Encounter: Payer: Self-pay | Admitting: Radiation Oncology

## 2015-09-10 VITALS — BP 129/75 | HR 87 | Temp 98.2°F | Ht 60.0 in | Wt 175.1 lb

## 2015-09-10 DIAGNOSIS — C541 Malignant neoplasm of endometrium: Secondary | ICD-10-CM

## 2015-09-10 DIAGNOSIS — Z79899 Other long term (current) drug therapy: Secondary | ICD-10-CM | POA: Diagnosis not present

## 2015-09-10 DIAGNOSIS — Z888 Allergy status to other drugs, medicaments and biological substances status: Secondary | ICD-10-CM | POA: Diagnosis not present

## 2015-09-10 DIAGNOSIS — Z510 Encounter for antineoplastic radiation therapy: Secondary | ICD-10-CM | POA: Diagnosis not present

## 2015-09-10 NOTE — Progress Notes (Signed)
Lonya has completed 7 fractions to her pelvis.  She denies pain other than cramping in her legs which she has had for awhile.  She reports having fatigue.  She reports having urinary frequency.  She denies having dysuria.  She reports having diarrhea 2 times a day and is taking Imodium and lomotil.  She said she is taking Imodium once a day and lomotil 2 a day.  She denies having any vaginal/rectal bleeding.  She denies having nausea.  BP 129/75 (BP Location: Right Arm, Patient Position: Sitting)   Pulse 87   Temp 98.2 F (36.8 C) (Oral)   Ht 5' (1.524 m)   Wt 175 lb 1.6 oz (79.4 kg)   LMP 07/16/2014 Comment: pt states she seen a mild amount of blood( spotting)  SpO2 100%   BMI 34.20 kg/m    Wt Readings from Last 3 Encounters:  09/10/15 175 lb 1.6 oz (79.4 kg)  09/03/15 177 lb 6.4 oz (80.5 kg)  09/03/15 176 lb 6.4 oz (80 kg)

## 2015-09-10 NOTE — Progress Notes (Signed)
  Radiation Oncology         [phone]) [phone] ________________________________  Name: Lydia Walsh MRN: [id number]  Date: 09/10/2015  DOB: 07-19-1955  Weekly Radiation Therapy Management      ICD-9-CM ICD-10-CM   1. 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 . . . . . . . The patient presents for routine under treatment assessment. has completed 7 fractions to her pelvis.  She denies pain other than cramping in her legs which she has had for awhile.  She reports having fatigue.  She reports having urinary frequency.  She denies having dysuria.  She reports having diarrhea 2 times a day and is taking Imodium and lomotil.  She said she is taking Imodium once a day and lomotil 2 a day.  She denies having any vaginal/rectal bleeding.  She denies having nausea.                                                                        Set-up films were reviewed.                                 The chart was checked. Physical Findings. . .  height is 5' (1.524 m) and weight is 175 lb 1.6 oz (79.4 kg). Her oral temperature is 98.2 F (36.8 C). Her blood pressure is 129/75 and her pulse is 87. Her oxygen saturation is 100%. . Weight essentially stable.  No significant changes. Lungs are clear to auscultation bilaterally. Heart has regular rate and rhythm. No palpable cervical, supraclavicular, or axillary adenopathy. Abdomen soft, non-tender, normal bowel sounds.  Impression . . . . . . . The patient is tolerating radiation. Plan . . . . . . . . . . . . Continue treatment as planned. ________________________________   Blair Promise, PhD, MD  This document serves as a record of services personally performed by Gery Pray, MD. It was created on his behalf by Truddie Hidden, a trained medical scribe. The creation of this record is based on the scribe's personal observations and the  provider's statements to them. This document has been checked and approved by the attending provider.

## 2015-09-11 ENCOUNTER — Ambulatory Visit
Admission: RE | Admit: 2015-09-11 | Discharge: 2015-09-11 | Disposition: A | Discharge status: Home / Self Care | Payer: BLUE CROSS/BLUE SHIELD | Source: Ambulatory Visit | Attending: Radiation Oncology | Admitting: Radiation Oncology

## 2015-09-11 ENCOUNTER — Encounter: Payer: Self-pay | Admitting: *Deleted

## 2015-09-11 DIAGNOSIS — Z888 Allergy status to other drugs, medicaments and biological substances status: Secondary | ICD-10-CM | POA: Diagnosis not present

## 2015-09-11 DIAGNOSIS — C541 Malignant neoplasm of endometrium: Secondary | ICD-10-CM | POA: Diagnosis not present

## 2015-09-11 DIAGNOSIS — Z510 Encounter for antineoplastic radiation therapy: Secondary | ICD-10-CM | POA: Diagnosis not present

## 2015-09-11 DIAGNOSIS — Z79899 Other long term (current) drug therapy: Secondary | ICD-10-CM | POA: Diagnosis not present

## 2015-09-12 ENCOUNTER — Ambulatory Visit
Admission: RE | Admit: 2015-09-12 | Discharge: 2015-09-12 | Disposition: A | Discharge status: Home / Self Care | Payer: BLUE CROSS/BLUE SHIELD | Source: Ambulatory Visit | Attending: Radiation Oncology | Admitting: Radiation Oncology

## 2015-09-12 ENCOUNTER — Encounter: Payer: Self-pay | Admitting: Internal Medicine

## 2015-09-12 DIAGNOSIS — Z79899 Other long term (current) drug therapy: Secondary | ICD-10-CM | POA: Diagnosis not present

## 2015-09-12 DIAGNOSIS — C541 Malignant neoplasm of endometrium: Secondary | ICD-10-CM | POA: Diagnosis not present

## 2015-09-12 DIAGNOSIS — Z888 Allergy status to other drugs, medicaments and biological substances status: Secondary | ICD-10-CM | POA: Diagnosis not present

## 2015-09-12 DIAGNOSIS — Z510 Encounter for antineoplastic radiation therapy: Secondary | ICD-10-CM | POA: Diagnosis not present

## 2015-09-13 ENCOUNTER — Ambulatory Visit
Admission: RE | Admit: 2015-09-13 | Discharge: 2015-09-13 | Disposition: A | Discharge status: Home / Self Care | Payer: BLUE CROSS/BLUE SHIELD | Source: Ambulatory Visit | Attending: Radiation Oncology | Admitting: Radiation Oncology

## 2015-09-13 DIAGNOSIS — Z888 Allergy status to other drugs, medicaments and biological substances status: Secondary | ICD-10-CM | POA: Diagnosis not present

## 2015-09-13 DIAGNOSIS — C541 Malignant neoplasm of endometrium: Secondary | ICD-10-CM | POA: Diagnosis not present

## 2015-09-13 DIAGNOSIS — Z79899 Other long term (current) drug therapy: Secondary | ICD-10-CM | POA: Diagnosis not present

## 2015-09-13 DIAGNOSIS — Z510 Encounter for antineoplastic radiation therapy: Secondary | ICD-10-CM | POA: Diagnosis not present

## 2015-09-14 ENCOUNTER — Other Ambulatory Visit: Payer: Self-pay | Admitting: Internal Medicine

## 2015-09-17 ENCOUNTER — Encounter: Payer: Self-pay | Admitting: Radiation Oncology

## 2015-09-17 ENCOUNTER — Ambulatory Visit
Admission: RE | Admit: 2015-09-17 | Discharge: 2015-09-17 | Disposition: A | Discharge status: Home / Self Care | Payer: BLUE CROSS/BLUE SHIELD | Source: Ambulatory Visit | Attending: Radiation Oncology | Admitting: Radiation Oncology

## 2015-09-17 VITALS — BP 137/78 | HR 84 | Temp 99.1°F | Ht 60.0 in | Wt 176.4 lb

## 2015-09-17 DIAGNOSIS — Z79899 Other long term (current) drug therapy: Secondary | ICD-10-CM | POA: Diagnosis not present

## 2015-09-17 DIAGNOSIS — Z888 Allergy status to other drugs, medicaments and biological substances status: Secondary | ICD-10-CM | POA: Diagnosis not present

## 2015-09-17 DIAGNOSIS — Z510 Encounter for antineoplastic radiation therapy: Secondary | ICD-10-CM | POA: Diagnosis not present

## 2015-09-17 DIAGNOSIS — C541 Malignant neoplasm of endometrium: Secondary | ICD-10-CM

## 2015-09-17 DIAGNOSIS — R197 Diarrhea, unspecified: Secondary | ICD-10-CM

## 2015-09-17 MED ORDER — DIPHENOXYLATE-ATROPINE 2.5-0.025 MG PO TABS: 2.0000 | ORAL_TABLET | Freq: Four times a day (QID) | ORAL | 0 refills | Status: DC | PRN

## 2015-09-17 NOTE — Progress Notes (Signed)
  Radiation Oncology         [phone] ________________________________  Name: Lydia Walsh MRN: [id number]  Date: 09/17/2015  DOB: 06/14/1955  Weekly Radiation Therapy Management      ICD-9-CM ICD-10-CM   1. Endometrial cancer (HCC) 182.0 C54.1   2. 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 . . . . . . . The patient presents for routine under treatment assessment.                            Lydia Walsh has completed 11 fractions to her pelvis.  She denies having pain other than occasional leg cramps.  She denies having any bladder issues.  She reports having diarrhea 2 times a day.  She is wondering if she could get a refill on Lomotil.  She denies having any vaginal/rectal bleeding.  She did have some nausea on Friday and Saturday.  She took zofran and it went away.  She reports having fatigue.           Set-up films were reviewed.                                 The chart was checked. Physical Findings. . .  height is 5' (1.524 m) and weight is 176 lb 6.4 oz (80 kg). Her oral temperature is 99.1 F (37.3 C). Her blood pressure is 137/78 and her pulse is 84. Her oxygen saturation is 100%.  Lungs are clear to auscultation bilaterally. Heart has regular rate and rhythm. Abdomen soft, non-tender, normal bowel sounds. Impression . . . . . . . The patient is tolerating radiation. Plan . . . . . . . . . . . . Continue treatment as planned. I advised the patient that she could increase her dosage of lomotil (up to 2 tablets by mouth 4 times daily as needed) for her diarrhea. I have refilled her Lomotil. ________________________________   Blair Promise, PhD, MD  This document serves as a record of services personally performed by Gery Pray, MD. It was created on his behalf by Darcus Austin, a trained medical scribe. The  creation of this record is based on the scribe's personal observations and the provider's statements to them. This document has been checked and approved by the attending provider.

## 2015-09-17 NOTE — Progress Notes (Signed)
Lydia Walsh has completed 11 fractions to her pelvis.  She denies having pain other then occasional leg cramps.  She denies having any bladder issues.  She reports having diarrhea 2 times per day.  She is wondering if she can get a refill on Lomotil.  She denies having any vaginal/rectal bleeding.  She did have some nausea on Friday and Saturday.  She took zofran and it went away.  She reports having fatigue.  BP 137/78 (BP Location: Left Arm, Patient Position: Sitting)   Pulse 84   Temp 99.1 F (37.3 C) (Oral)   Ht 5' (1.524 m)   Wt 176 lb 6.4 oz (80 kg)   LMP 07/16/2014 Comment: pt states she seen a mild amount of blood( spotting)  SpO2 100%   BMI 34.45 kg/m    Wt Readings from Last 3 Encounters:  09/17/15 176 lb 6.4 oz (80 kg)  09/10/15 175 lb 1.6 oz (79.4 kg)  09/03/15 177 lb 6.4 oz (80.5 kg)

## 2015-09-18 ENCOUNTER — Ambulatory Visit
Admission: RE | Admit: 2015-09-18 | Discharge: 2015-09-18 | Disposition: A | Discharge status: Home / Self Care | Payer: BLUE CROSS/BLUE SHIELD | Source: Ambulatory Visit | Attending: Radiation Oncology | Admitting: Radiation Oncology

## 2015-09-18 DIAGNOSIS — Z79899 Other long term (current) drug therapy: Secondary | ICD-10-CM | POA: Diagnosis not present

## 2015-09-18 DIAGNOSIS — Z510 Encounter for antineoplastic radiation therapy: Secondary | ICD-10-CM | POA: Diagnosis not present

## 2015-09-18 DIAGNOSIS — C541 Malignant neoplasm of endometrium: Secondary | ICD-10-CM | POA: Diagnosis not present

## 2015-09-18 DIAGNOSIS — Z888 Allergy status to other drugs, medicaments and biological substances status: Secondary | ICD-10-CM | POA: Diagnosis not present

## 2015-09-19 ENCOUNTER — Ambulatory Visit
Admission: RE | Admit: 2015-09-19 | Discharge: 2015-09-19 | Disposition: A | Discharge status: Home / Self Care | Payer: BLUE CROSS/BLUE SHIELD | Source: Ambulatory Visit | Attending: Radiation Oncology | Admitting: Radiation Oncology

## 2015-09-19 DIAGNOSIS — Z79899 Other long term (current) drug therapy: Secondary | ICD-10-CM | POA: Diagnosis not present

## 2015-09-19 DIAGNOSIS — Z888 Allergy status to other drugs, medicaments and biological substances status: Secondary | ICD-10-CM | POA: Diagnosis not present

## 2015-09-19 DIAGNOSIS — Z510 Encounter for antineoplastic radiation therapy: Secondary | ICD-10-CM | POA: Diagnosis not present

## 2015-09-19 DIAGNOSIS — C541 Malignant neoplasm of endometrium: Secondary | ICD-10-CM | POA: Diagnosis not present

## 2015-09-20 ENCOUNTER — Ambulatory Visit
Admission: RE | Admit: 2015-09-20 | Discharge: 2015-09-20 | Disposition: A | Discharge status: Home / Self Care | Payer: BLUE CROSS/BLUE SHIELD | Source: Ambulatory Visit | Attending: Radiation Oncology | Admitting: Radiation Oncology

## 2015-09-20 DIAGNOSIS — Z79899 Other long term (current) drug therapy: Secondary | ICD-10-CM | POA: Diagnosis not present

## 2015-09-20 DIAGNOSIS — Z888 Allergy status to other drugs, medicaments and biological substances status: Secondary | ICD-10-CM | POA: Diagnosis not present

## 2015-09-20 DIAGNOSIS — Z510 Encounter for antineoplastic radiation therapy: Secondary | ICD-10-CM | POA: Diagnosis not present

## 2015-09-20 DIAGNOSIS — C541 Malignant neoplasm of endometrium: Secondary | ICD-10-CM | POA: Diagnosis not present

## 2015-09-22 ENCOUNTER — Other Ambulatory Visit: Payer: Self-pay | Admitting: Oncology

## 2015-09-22 ENCOUNTER — Ambulatory Visit (HOSPITAL_COMMUNITY)
Admission: EM | Admit: 2015-09-22 | Discharge: 2015-09-22 | Disposition: A | Discharge status: Home / Self Care | Payer: BLUE CROSS/BLUE SHIELD | Attending: Family Medicine | Admitting: Family Medicine

## 2015-09-22 ENCOUNTER — Encounter (HOSPITAL_COMMUNITY): Payer: Self-pay | Admitting: Emergency Medicine

## 2015-09-22 DIAGNOSIS — R002 Palpitations: Secondary | ICD-10-CM | POA: Diagnosis not present

## 2015-09-22 DIAGNOSIS — R0789 Other chest pain: Secondary | ICD-10-CM | POA: Diagnosis not present

## 2015-09-22 NOTE — Discharge Instructions (Signed)
You will need to call your doctor tomorrow for an appointment. You will also likely need referral to cardiologist. He will probably have a monitoring system with you to record irregular heartbeat. In the meantime should this recur and especially if you are having chest discomfort such as heaviness, tightness or pressure or squeezing associated with shortness of breath or pain radiating into the arm or jaw, sweating, weakness, faintness or any other problems or symptoms call 911 or go directly to the emergency department.

## 2015-09-22 NOTE — ED Provider Notes (Signed)
CSN: [id number]     Arrival date & time 09/22/15  1404 History   First MD Initiated Contact with Patient 09/22/15 1432     Chief Complaint  Patient presents with  . Palpitations   (Consider location/radiation/quality/duration/timing/severity/associated sxs/prior Treatment) 60 year old female complaining of episodes of heart fluttering for the past 2 days. It comes and goes. It occurs approximately 3 times a day and may last up to 30 minutes at a time. It has occurred at nighttime. She states it seems to occur more often when lying on the left side and will often abate when lying on the right side. She also complains of left anterior chest pain which she describes as a dull ache. Denies heaviness, tightness, fullness or pressure. During these episodes there is no associated diaphoresis, dyspnea, weakness, syncope or other symptoms.  She is currently receiving radiotherapy for endometrial cancer. She has no known cardiac history although she states that several years ago she saw a PCP for heart fluttering, had an EKG performed and was told it was normal. There was no additional workup or follow-up.      Past Medical History:  Diagnosis Date  . Cancer [facility])    endometrial cancer  . Endometrial polyp   . GERD (gastroesophageal reflux disease)   . Heart murmur   . History of uterine fibroid   . Hyperlipidemia   . Hypertension   . Hypothyroidism   . OSA (obstructive sleep apnea)    moderate OSA per study 06-21-2005--  per pt does need cpap anymore never purchased it  . Osteoarthritis   . PMB (postmenopausal bleeding)   . Wears dentures    upper  . Wears glasses    Past Surgical History:  Procedure Laterality Date  . DILATATION & CURETTAGE/HYSTEROSCOPY WITH MYOSURE N/A 03/07/2015   Procedure: DILATATION & CURETTAGE/HYSTEROSCOPY WITH MYOSURE (POLYP);  Surgeon: Arvella Nigh, MD;  Location: [facility];  Service: Gynecology;  Laterality: N/A;  . DILATATION &  CURRETTAGE/HYSTEROSCOPY WITH RESECTOCOPE  12-13-2002  &  09-26-2004   polyp/  submucosal fibroid  . EXCISION BENIGN RIGHT BREAST MASS  age 46  . KNEE ARTHROSCOPY W/ MENISCECTOMY Bilateral right 04-12-2006//  left 12-29-2006   and Chondroplasty  . ROBOTIC ASSISTED TOTAL HYSTERECTOMY WITH BILATERAL SALPINGO OOPHERECTOMY Bilateral 05/02/2015   Procedure: XI ROBOTIC ASSISTED TOTAL HYSTERECTOMY WITH BILATERAL SALPINGO OOPHORECTOMY AND SENTINAL LYMPH NODE BIOPSY;  Surgeon: Janie Morning, MD;  Location: [facility];  Service: Gynecology;  Laterality: Bilateral;  . TRANSTHORACIC ECHOCARDIOGRAM  04-02-2010   normal LV, ef 65%  . TUBAL LIGATION  1980's   Family History  Problem Relation Age of Onset  . Hypertension Mother    Social History  Substance Use Topics  . Smoking status: Former Smoker    Years: 2.00    Types: Cigarettes    Quit date: 02/28/1994  . Smokeless tobacco: Never Used  . Alcohol use No   OB History    No data available     Review of Systems  Constitutional: Negative for activity change, diaphoresis and fever.  HENT: Negative.   Respiratory: Negative for apnea, cough, shortness of breath and wheezing.   Cardiovascular: Positive for chest pain and palpitations. Negative for leg swelling.  Gastrointestinal: Negative.  Negative for nausea and vomiting.  Genitourinary: Negative.   Musculoskeletal: Negative for back pain, gait problem, myalgias and neck pain.  Skin: Negative for color change.  Neurological: Negative.  Negative for dizziness, syncope, speech difficulty and headaches.  All other systems reviewed  and are negative.   Allergies  Hctz [hydrochlorothiazide]  Home Medications   Prior to Admission medications   Medication Sig Start Date End Date Taking? Authorizing Provider  allopurinol (ZYLOPRIM) 100 MG tablet TAKE ONE TABLET BY MOUTH ONCE DAILY 08/05/15  Yes Tasrif Ahmed, MD  amLODipine (NORVASC) 10 MG tablet TAKE ONE TABLET BY MOUTH ONCE DAILY 09/17/15  Yes Tasrif  Ahmed, MD  levothyroxine (SYNTHROID, LEVOTHROID) 112 MCG tablet Take 1 tablet (112 mcg total) by mouth daily before breakfast. 08/06/15  Yes Tasrif Ahmed, MD  omeprazole (PRILOSEC) 20 MG capsule Take 1 capsule (20 mg total) by mouth daily. 09/03/15  Yes Alexa Angela Burke, MD  simvastatin (ZOCOR) 20 MG tablet Take 1 tablet (20 mg total) by mouth every evening. 09/04/15  Yes Tasrif Ahmed, MD  spironolactone (ALDACTONE) 25 MG tablet Take 25 mg by mouth daily. 05/13/15  Yes Historical Provider, MD  vitamin E (VITAMIN E) 400 UNIT capsule Take 400 Units by mouth daily.   Yes Historical Provider, MD  dexamethasone (DECADRON) 4 MG tablet Take 5 tablets with food 12 hrs and 6 hrs before taxol Patient not taking: Reported on 08/19/2015 07/15/15   Lennis Marion Downer, MD  diphenoxylate-atropine (LOMOTIL) 2.5-0.025 MG tablet Take 2 tablets by mouth 4 (four) times daily as needed for diarrhea or loose stools. 09/17/15   Gery Pray, MD  docusate sodium (COLACE) 100 MG capsule Take 1 capsule (100 mg total) by mouth 2 (two) times daily. Patient not taking: Reported on 08/19/2015 05/20/15   Everitt Amber, MD  Ferrous Fumarate (HEMOCYTE - 106 MG FE) 324 (106 Fe) MG TABS tablet Take 1 tablet (106 mg of iron total) by mouth daily. On an empty stomach with OJ 06/25/15   Lennis Marion Downer, MD  HYDROcodone-acetaminophen (NORCO/VICODIN) 5-325 MG tablet Take 1 tablet by mouth every 4 (four) hours as needed for severe pain. Patient not taking: Reported on 08/19/2015 07/31/15   Melony Overly, MD  ibuprofen (ADVIL,MOTRIN) 200 MG tablet Take 200 mg by mouth every 6 (six) hours as needed for mild pain. Reported on 07/15/2015    Historical Provider, MD  lidocaine-prilocaine (EMLA) cream Apply to port 1-2 hours before accessing with a needle. Cover with plastic wrap. 07/15/15   Lennis Marion Downer, MD  LORazepam (ATIVAN) 0.5 MG tablet Place 1 tablet under the tongue or swallow every 6 hours as needed for nausea. Will make sleepy. Patient not taking: Reported on  09/10/2015 06/21/15   Gordy Levan, MD  ondansetron (ZOFRAN) 8 MG tablet Take 1 tablet (8 mg total) by mouth every 8 (eight) hours as needed for nausea. Will make drowsy. 06/21/15   Lennis Marion Downer, MD   Meds Ordered and Administered this Visit  Medications - No data to display  BP 137/80 (BP Location: Right Arm)   Pulse 84   Temp 97.8 F (36.6 C) (Oral)   Resp 15   LMP 07/16/2014 Comment: pt states she seen a mild amount of blood( spotting)  SpO2 98%  No data found.   Physical Exam  Constitutional: She is oriented to person, place, and time. She appears well-developed and well-nourished. No distress.  HENT:  Head: Normocephalic and atraumatic.  Eyes: EOM are normal.  Neck: Normal range of motion. Neck supple.  Cardiovascular: Normal rate, regular rhythm, normal heart sounds and intact distal pulses.   Pulmonary/Chest: Effort normal and breath sounds normal. No respiratory distress. She has no wheezes. She exhibits tenderness.  Reproducible left anterior chest wall tenderness. Patient  states that the pain elicited with palpation does not reproduce the same pain in which she has been having over the past 2-3 days.  Musculoskeletal: She exhibits no edema or deformity.  Neurological: She is alert and oriented to person, place, and time.  Skin: Skin is warm and dry. No erythema.  Psychiatric: She has a normal mood and affect. Her speech is normal.  Poor fund of knowledge, some difficulty in following commands.  Nursing note and vitals reviewed.   Urgent Care Course   Clinical Course  ED ECG REPORT   Date: 09/22/2015  Rate: 77  Rhythm: normal sinus rhythm  QRS Axis: normal  Intervals: PR prolonged  ST/T Wave abnormalities: early repolarization  Conduction Disutrbances:first-degree A-V block   Narrative Interpretation:   Old EKG Reviewed: unchanged  I have personally reviewed the EKG tracing and agree with the computerized printout as noted.   Procedures (including  critical care time)  Labs Review Labs Reviewed - No data to display  Imaging Review No results found.   Visual Acuity Review  Right Eye Distance:   Left Eye Distance:   Bilateral Distance:    Right Eye Near:   Left Eye Near:    Bilateral Near:         MDM   1. Heart palpitations   2. Chest wall pain    You will need to call your doctor tomorrow for an appointment. You will also likely need referral to cardiologist. He will probably have a monitoring system with you to record irregular heartbeat. In the meantime should this recur and especially if you are having chest discomfort such as heaviness, tightness or pressure or squeezing associated with shortness of breath or pain radiating into the arm or jaw, sweating, weakness, faintness or any other problems or symptoms call 911 or go directly to the emergency department. At the time of discharge patient is stable and asymptomatic. She has had no episode since being in the urgent care.    Janne Napoleon, NP 09/22/15 1534

## 2015-09-22 NOTE — ED Triage Notes (Signed)
Patient has noted "fluttering" in chest since Friday.  history of the same, but cannot remember what was said about this or done for her.  Patient reports an episode of dull chest pain last night.  Pain in left chest last night.  No pain today.  Patient reports fluttering occurs all day.  Patient reports notices fluttering more when she lies on left side.  Sensation not noticeable when lying on right side.

## 2015-09-22 NOTE — ED Notes (Signed)
ekg handed to Janne Napoleon, NP

## 2015-09-23 ENCOUNTER — Ambulatory Visit
Admission: RE | Admit: 2015-09-23 | Discharge: 2015-09-23 | Disposition: A | Discharge status: Home / Self Care | Payer: BLUE CROSS/BLUE SHIELD | Source: Ambulatory Visit | Attending: Radiation Oncology | Admitting: Radiation Oncology

## 2015-09-23 ENCOUNTER — Encounter: Payer: Self-pay | Admitting: Oncology

## 2015-09-23 ENCOUNTER — Telehealth: Payer: Self-pay | Admitting: Oncology

## 2015-09-23 ENCOUNTER — Ambulatory Visit (HOSPITAL_BASED_OUTPATIENT_CLINIC_OR_DEPARTMENT_OTHER): Payer: BLUE CROSS/BLUE SHIELD | Admitting: Oncology

## 2015-09-23 ENCOUNTER — Other Ambulatory Visit (HOSPITAL_BASED_OUTPATIENT_CLINIC_OR_DEPARTMENT_OTHER): Payer: BLUE CROSS/BLUE SHIELD

## 2015-09-23 VITALS — BP 148/72 | HR 81 | Temp 98.4°F | Resp 18 | Ht 60.0 in | Wt 175.3 lb

## 2015-09-23 DIAGNOSIS — C541 Malignant neoplasm of endometrium: Secondary | ICD-10-CM

## 2015-09-23 DIAGNOSIS — Z79899 Other long term (current) drug therapy: Secondary | ICD-10-CM | POA: Diagnosis not present

## 2015-09-23 DIAGNOSIS — Z95828 Presence of other vascular implants and grafts: Secondary | ICD-10-CM

## 2015-09-23 DIAGNOSIS — R197 Diarrhea, unspecified: Secondary | ICD-10-CM

## 2015-09-23 DIAGNOSIS — E876 Hypokalemia: Secondary | ICD-10-CM | POA: Diagnosis not present

## 2015-09-23 DIAGNOSIS — Z510 Encounter for antineoplastic radiation therapy: Secondary | ICD-10-CM | POA: Diagnosis not present

## 2015-09-23 DIAGNOSIS — Z888 Allergy status to other drugs, medicaments and biological substances status: Secondary | ICD-10-CM | POA: Diagnosis not present

## 2015-09-23 LAB — COMPREHENSIVE METABOLIC PANEL
ALBUMIN: 3.7 g/dL (ref 3.5–5.0)
ALT: 24 U/L (ref 0–55)
AST: 21 U/L (ref 5–34)
Alkaline Phosphatase: 85 U/L (ref 40–150)
Anion Gap: 10 mEq/L (ref 3–11)
BUN: 15.6 mg/dL (ref 7.0–26.0)
CO2: 24 meq/L (ref 22–29)
Calcium: 9.5 mg/dL (ref 8.4–10.4)
Chloride: 105 mEq/L (ref 98–109)
Creatinine: 0.7 mg/dL (ref 0.6–1.1)
GLUCOSE: 89 mg/dL (ref 70–140)
POTASSIUM: 3.4 meq/L — AB (ref 3.5–5.1)
SODIUM: 140 meq/L (ref 136–145)
TOTAL PROTEIN: 7.2 g/dL (ref 6.4–8.3)
Total Bilirubin: 0.54 mg/dL (ref 0.20–1.20)

## 2015-09-23 LAB — CBC WITH DIFFERENTIAL/PLATELET
BASO%: 0.6 % (ref 0.0–2.0)
BASOS ABS: 0 10*3/uL (ref 0.0–0.1)
EOS%: 20.8 % — ABNORMAL HIGH (ref 0.0–7.0)
Eosinophils Absolute: 0.9 10*3/uL — ABNORMAL HIGH (ref 0.0–0.5)
HEMATOCRIT: 37.7 % (ref 34.8–46.6)
HGB: 12.2 g/dL (ref 11.6–15.9)
LYMPH#: 0.4 10*3/uL — AB (ref 0.9–3.3)
LYMPH%: 8.4 % — AB (ref 14.0–49.7)
MCH: 28.6 pg (ref 25.1–34.0)
MCHC: 32.4 g/dL (ref 31.5–36.0)
MCV: 88.4 fL (ref 79.5–101.0)
MONO#: 0.3 10*3/uL (ref 0.1–0.9)
MONO%: 6.2 % (ref 0.0–14.0)
NEUT#: 2.8 10*3/uL (ref 1.5–6.5)
NEUT%: 64 % (ref 38.4–76.8)
PLATELETS: 141 10*3/uL — AB (ref 145–400)
RBC: 4.26 10*6/uL (ref 3.70–5.45)
RDW: 17.8 % — ABNORMAL HIGH (ref 11.2–14.5)
WBC: 4.4 10*3/uL (ref 3.9–10.3)

## 2015-09-23 MED ORDER — DEXAMETHASONE 4 MG PO TABS: ORAL_TABLET | ORAL | 2 refills | Status: DC

## 2015-09-23 NOTE — Telephone Encounter (Signed)
appt made and avs printed °

## 2015-09-23 NOTE — Progress Notes (Signed)
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 She has had 3 cycles of carboplatin taxol from 06-26-15 thru 08-07-15 with granix support (x3 beginning day 2), and will complete pelvic radiation on 10-07-15. Plan is 3 additional cycles of carbo taxol after radiation.  Patient is tolerating radiation generally well, with some loose stools including x 3 on 09-22-15. She is drinking OJ and gatorade, aware of need for good hydration. She has not had much nausea, no abdominal or pelvic pain, no bleeding, no skin irritation from radiation, no problems with PAC. She had intermittent palpitations over this past weekend, seen at urgent care on 09-22-15 with unremarkable EKG done when she was not having palpitations; she is to follow up with PCP. She saw PCP for discomfort in throat, initially seemed better with omeprazole but has recurred, feels as if "knot in throat", no sore throat or problems swallowing. She does have some post nasal drainage which may be giving that sensation, has not used claritin since off chemo. No LE swelling. No new or different pain. Is able to sleep Remainder of 10 point Review of Systems negative  PAC flushed 08-22-15  ONCOLOGIC HISTORY Patient had ~ 6 months of post menopausal bleeding, at times heavy, prior to reestablishing with Dr Richardean Chimera. [place] 02-12-15 showed slightly enlarged uterus with multiple intramural fibroids up to 3.5 cm, endometrial thickness 4 mm, polypoid lesion in cavity, ovaries grossly normal. She had hysteroscopic resection of endometrial polyp, with grade 2 endometrial adenocarcinoma. She was seen by gyn oncology on 03-29-15, with uterus large without other findings of  concern. She had robotic laparoscopic hysterectomy BSO with bilateral sentinel node sampling and lysis of adhesions by Dr Nelly Rout on 05-02-15. Pathology [phone]) grade 2 endometrial adenocarcinoma with 90% invasion of myometrium (1.4 cm out of 1.5 cm), no involvement cervix or adnexa, + focal LVSI, 2 benign right sentinel nodes and no left pelvic nodes in specimen; MMR normal. Surgery was uncomplicated, patient DC on post op day 1; she has recovered well from surgery, returning to work after 3 weeks. . She had CT AP 05-24-15 with mild left common iliac adenopathy up to 1.1 cm as well as 0.7 cm low aortocaval node, and a 1.6 x 1.6 cm renal mass posterior lower left kidney indeterminate. PET 06-12-15 had SUV 4.1 at the 10 mm left common iliac node, other nodes too small for PET evaluation; mildly hyperdense lesion lower pole left kidney 1.8 cm. She saw Dr Roselind Messier in consultation on 06-06-15. Recommendation from surgical pathology and imaging is for chemotherapy and radiation in sandwich fashion.  First carboplatin taxol given 06-26-15, neutropenic with ANC 0.4 on day 13 cycle 1, gCSF added. She had cycle 3 chemotherapy on 08-07-15. Pelvic IMRT planned thru 10-07-15.  Objective:  Vital signs in last 24 hours:  BP (!) 148/72 (BP Location: Left Arm, Patient Position: Sitting)   Pulse 81   Temp 98.4 F (36.9 C) (Oral)   Resp 18   Ht 5' (1.524 m)   Wt 175 lb 4.8 oz (79.5 kg)   LMP 07/16/2014 Comment: pt states she seen a mild amount of blood( spotting)  SpO2 100%   BMI 34.24 kg/m  Weight down 3 lbs.  Looks comfortable, respirations not labored  RA Alert, oriented and appropriate. Ambulatory without difficulty, able to get on and off exam table with minimal assistance.  HEENT:PERRL, sclerae not icteric. Oral mucosa moist without lesions, posterior pharynx dull erythema bilaterally no exudate consistent with post nasal drainage Neck supple. No JVD.  Lymphatics:no cervical,supraclavicular or inguinal  adenopathy Resp: clear to auscultation bilaterally and normal percussion bilaterally Cardio: regular rate and rhythm. No gallop. GI: abdomen obese, soft, nontender, not distended, no mass or organomegaly. Normally active bowel sounds. Surgical incisions not remarkable. Musculoskeletal/ Extremities: without pitting edema, cords, tenderness Neuro: no significant peripheral neuropathy. Otherwise nonfocal. PSYCH appropriate mood and affect Skin without rash, ecchymosis, petechiae Portacath-without erythema or tenderness  Lab Results:  Results for orders placed or performed in visit on 09/23/15  Comprehensive metabolic panel  Result Value Ref Range   Sodium 140 136 - 145 mEq/L   Potassium 3.4 (L) 3.5 - 5.1 mEq/L   Chloride 105 98 - 109 mEq/L   CO2 24 22 - 29 mEq/L   Glucose 89 70 - 140 mg/dl   BUN 20.6 7.0 - 49.4 mg/dL   Creatinine 0.7 0.6 - 1.1 mg/dL   Total Bilirubin 9.32 0.20 - 1.20 mg/dL   Alkaline Phosphatase 85 40 - 150 U/L   AST 21 5 - 34 U/L   ALT 24 0 - 55 U/L   Total Protein 7.2 6.4 - 8.3 g/dL   Albumin 3.7 3.5 - 5.0 g/dL   Calcium 9.5 8.4 - 63.8 mg/dL   Anion Gap 10 3 - 11 mEq/L   EGFR >90 >90 ml/min/1.73 m2  CBC with Differential  Result Value Ref Range   WBC 4.4 3.9 - 10.3 10e3/uL   NEUT# 2.8 1.5 - 6.5 10e3/uL   HGB 12.2 11.6 - 15.9 g/dL   HCT 41.3 31.3 - 43.8 %   Platelets 141 (L) 145 - 400 10e3/uL   MCV 88.4 79.5 - 101.0 fL   MCH 28.6 25.1 - 34.0 pg   MCHC 32.4 31.5 - 36.0 g/dL   RBC 8.17 9.10 - 5.86 10e6/uL   RDW 17.8 (H) 11.2 - 14.5 %   lymph# 0.4 (L) 0.9 - 3.3 10e3/uL   MONO# 0.3 0.1 - 0.9 10e3/uL   Eosinophils Absolute 0.9 (H) 0.0 - 0.5 10e3/uL   Basophils Absolute 0.0 0.0 - 0.1 10e3/uL   NEUT% 64.0 38.4 - 76.8 %   LYMPH% 8.4 (L) 14.0 - 49.7 %   MONO% 6.2 0.0 - 14.0 %   EOS% 20.8 (H) 0.0 - 7.0 %   BASO% 0.6 0.0 - 2.0 %     Studies/Results:  No results found.  Medications: I have reviewed the patient's current medications. Refill decadron QS one  cycle with refills for last 3 treatments Needs claritin starting at least day of chemo, for taxol and granix aches Will add KCl 10 mEq daily x 2 weeks  DISCUSSION Patient will increase OJ and gatorade with slightly low K  Patient is aware of plan for additional 3 cycles of chemo after radiation, and in agreement. She prefers Wed treatments.  Medications as above    Assessment/Plan:  1.IIIC1 grade 2 endometrioid endometrial carcinoma: post robotic assisted laparoscopic hysterectomy BSO and right pelvic sentinel nodes, with invasion 90% of myometrium and CT/PET evidence of left common iliac node involvement. Plan total 6 cycles of carboplatin taxol chemotherapy in sandwich fashion with RT. Cycle 1 given 06-26-15, chemo neutropenia day 13, granix added.  Radiation by Dr Roselind Messier, then complete last 3 cycles of same chemo  after external beam radiation. She will need gCSF and claritin with subsequent chemo treatments due to documented chemo neutropenia and taxol aches.  Will give cycle 4 carbo taxol on 10-4 as long as no marked diarrhea, and if ANC >=1.5 and plt >=100k. She will have granix days 2,3,4. 2.Iron deficiency related to 6+ months of vaginal bleeding: continue oral iron as ferrous fumarate 3.questionable mass left kidney and right nephrolithiasis: UA negative for blood 07-08-15. May need urology input for left kidney after present treatment completes 4.hypothyroidism on medication, monitored by primary care 5.obesity, BMI 35. Exercise limited by chronic problems knees 6. Cholelithiasis, not presently symptomatic 7.diverticulitis and hemorrhoids by colonoscopy 2007. She is due repeat colonoscopy, likely best to wait on this also until present treatment completed. Some fairly mild radiation diarrhea now. 8.minimal past tobacco 9.Palpitations a few days ago without other associated symptoms, EKG not done at time of palpitations. Should follow up with PCP. HTN, elevated lipids. Good EF  2012 10.EMR mention of OSA 11.PAC in. Will be due flush early Oct, which works well with timing of additional chemo [employer]tn hypokalemia likely from loose stools with radiation. Increase oral K and follow.  KCL 10 mEq daily x 2 weeks due to recent palpitations and ongoing radiation. Will check magnesium with next labs here.   All questions answered and she is in agreement with plans above. Chemo and granix orders placed. Will ask RN to speak with her shortly prior to 10-4 to be sure no symptoms of concern prior to resuming chemo. Time spent 25 min including >50% counseling and coordination of care. Cc Dr Sondra Come   Evlyn Clines, MD   09/24/2015, 8:30 AM

## 2015-09-24 ENCOUNTER — Encounter: Payer: Self-pay | Admitting: Radiation Oncology

## 2015-09-24 ENCOUNTER — Telehealth: Payer: Self-pay

## 2015-09-24 ENCOUNTER — Ambulatory Visit
Admission: RE | Admit: 2015-09-24 | Discharge: 2015-09-24 | Disposition: A | Discharge status: Home / Self Care | Payer: BLUE CROSS/BLUE SHIELD | Source: Ambulatory Visit | Attending: Radiation Oncology | Admitting: Radiation Oncology

## 2015-09-24 VITALS — BP 121/73 | HR 75 | Temp 97.9°F | Ht 60.0 in | Wt 178.1 lb

## 2015-09-24 DIAGNOSIS — E876 Hypokalemia: Secondary | ICD-10-CM

## 2015-09-24 DIAGNOSIS — C541 Malignant neoplasm of endometrium: Secondary | ICD-10-CM

## 2015-09-24 DIAGNOSIS — Z79899 Other long term (current) drug therapy: Secondary | ICD-10-CM | POA: Diagnosis not present

## 2015-09-24 DIAGNOSIS — Z888 Allergy status to other drugs, medicaments and biological substances status: Secondary | ICD-10-CM | POA: Diagnosis not present

## 2015-09-24 DIAGNOSIS — Z510 Encounter for antineoplastic radiation therapy: Secondary | ICD-10-CM | POA: Diagnosis not present

## 2015-09-24 MED ORDER — POTASSIUM CHLORIDE ER 10 MEQ PO TBCR
10.0000 meq | EXTENDED_RELEASE_TABLET | Freq: Every day | ORAL | 0 refills | Status: DC
Start: 2015-09-24 — End: 2015-10-28

## 2015-09-24 NOTE — Telephone Encounter (Signed)
-----   Message from Gordy Levan, MD sent at 09/24/2015  8:51 AM EDT ----- Please have her take KCl 10 mEq daily x 2 weeks, due to slightly low K on 9-11, some RT diarrhea and palpitations  #14 thanks

## 2015-09-24 NOTE — Telephone Encounter (Signed)
LM for Ms Lydia Walsh the results of her KCL as noted below by Dr. Marko Plume. Potassium tablets electronicaly sent to her pharmacy. She can call back to the office if she has any questions or concerns.

## 2015-09-24 NOTE — Progress Notes (Signed)
Lydia Walsh has completed 16 fractions to her pelvis.  She denies having pain, bladder issues, nausea, vaginal/rectal bleeding or skin irritation.  She reports having diarrhea once a day and is taking lomotil 1-2 times a day.  She reports having fatigue towards the end of the week.  BP 121/73 (BP Location: Right Arm, Patient Position: Sitting)   Pulse 75   Temp 97.9 F (36.6 C) (Oral)   Ht 5' (1.524 m)   Wt 178 lb 1.6 oz (80.8 kg)   LMP 07/16/2014 Comment: pt states she seen a mild amount of blood( spotting)  SpO2 100%   BMI 34.78 kg/m    Wt Readings from Last 3 Encounters:  09/24/15 178 lb 1.6 oz (80.8 kg)  09/23/15 175 lb 4.8 oz (79.5 kg)  09/17/15 176 lb 6.4 oz (80 kg)

## 2015-09-24 NOTE — Progress Notes (Signed)
  Radiation Oncology         [phone] ________________________________  Name: Lydia Walsh MRN: [id number]  Date: 09/24/2015  DOB: [birth year]  Weekly Radiation Therapy Management      ICD-9-CM ICD-10-CM   1. Endometrial cancer (HCC) 182.0 C54.1      Stage IIIC-1 Grade 2 endometrioid endometrial cancer  Current Dose: 32 Gy     Planned Dose:  50 Gy  Narrative . . . . . . . . The patient presents for routine under treatment assessment.                            [name] has completed 16 fractions to her pelvis.  She denies having pain, bladder issues, nausea, vaginal/rectal bleeding or skin irritation.  She reports having diarrhea once a day and is taking lomotil 1-2 times a day.  She reports having fatigue towards the end of the week.   .           Set-up films were reviewed.                                 The chart was checked. Physical Findings. . .  height is 5' (1.524 m) and weight is 178 lb 1.6 oz (80.8 kg). Her oral temperature is 97.9 F (36.6 C). Her blood pressure is 121/73 and her pulse is 75. Her oxygen saturation is 100%.  Lungs are clear to auscultation bilaterally. Heart has regular rate and rhythm. Abdomen soft, non-tender, normal bowel sounds. Impression . . . . . . . The patient is tolerating radiation. Plan . . . . . . . . . . . . Continue treatment as planned.  ________________________________   Blair Promise, PhD, MD This document serves as a record of services personally performed by Gery Pray, MD. It was created on his behalf by Truddie Hidden, a trained medical scribe. The creation of this record is based on the scribe's personal observations and the provider's statements to them. This document has been checked and approved by the attending provider.

## 2015-09-25 ENCOUNTER — Ambulatory Visit
Admission: RE | Admit: 2015-09-25 | Discharge: 2015-09-25 | Disposition: A | Discharge status: Home / Self Care | Payer: BLUE CROSS/BLUE SHIELD | Source: Ambulatory Visit | Attending: Radiation Oncology | Admitting: Radiation Oncology

## 2015-09-25 DIAGNOSIS — C541 Malignant neoplasm of endometrium: Secondary | ICD-10-CM | POA: Diagnosis not present

## 2015-09-25 DIAGNOSIS — Z79899 Other long term (current) drug therapy: Secondary | ICD-10-CM | POA: Diagnosis not present

## 2015-09-25 DIAGNOSIS — Z888 Allergy status to other drugs, medicaments and biological substances status: Secondary | ICD-10-CM | POA: Diagnosis not present

## 2015-09-25 DIAGNOSIS — Z510 Encounter for antineoplastic radiation therapy: Secondary | ICD-10-CM | POA: Diagnosis not present

## 2015-09-26 ENCOUNTER — Ambulatory Visit
Admission: RE | Admit: 2015-09-26 | Discharge: 2015-09-26 | Disposition: A | Discharge status: Home / Self Care | Payer: BLUE CROSS/BLUE SHIELD | Source: Ambulatory Visit | Attending: Radiation Oncology | Admitting: Radiation Oncology

## 2015-09-26 DIAGNOSIS — C541 Malignant neoplasm of endometrium: Secondary | ICD-10-CM | POA: Diagnosis not present

## 2015-09-26 DIAGNOSIS — Z888 Allergy status to other drugs, medicaments and biological substances status: Secondary | ICD-10-CM | POA: Diagnosis not present

## 2015-09-26 DIAGNOSIS — Z79899 Other long term (current) drug therapy: Secondary | ICD-10-CM | POA: Diagnosis not present

## 2015-09-26 DIAGNOSIS — Z510 Encounter for antineoplastic radiation therapy: Secondary | ICD-10-CM | POA: Diagnosis not present

## 2015-09-27 ENCOUNTER — Ambulatory Visit: Payer: BLUE CROSS/BLUE SHIELD

## 2015-09-30 ENCOUNTER — Ambulatory Visit (INDEPENDENT_AMBULATORY_CARE_PROVIDER_SITE_OTHER): Payer: BLUE CROSS/BLUE SHIELD | Admitting: Internal Medicine

## 2015-09-30 ENCOUNTER — Ambulatory Visit (HOSPITAL_COMMUNITY)
Admission: RE | Admit: 2015-09-30 | Discharge: 2015-09-30 | Disposition: A | Discharge status: Home / Self Care | Payer: BLUE CROSS/BLUE SHIELD | Source: Ambulatory Visit | Attending: Internal Medicine | Admitting: Internal Medicine

## 2015-09-30 ENCOUNTER — Ambulatory Visit
Admission: RE | Admit: 2015-09-30 | Discharge: 2015-09-30 | Disposition: A | Discharge status: Home / Self Care | Payer: BLUE CROSS/BLUE SHIELD | Source: Ambulatory Visit | Attending: Radiation Oncology | Admitting: Radiation Oncology

## 2015-09-30 ENCOUNTER — Encounter: Payer: Self-pay | Admitting: Internal Medicine

## 2015-09-30 VITALS — BP 151/77 | HR 93 | Temp 98.2°F | Ht 60.0 in | Wt 177.2 lb

## 2015-09-30 DIAGNOSIS — K520 Gastroenteritis and colitis due to radiation: Secondary | ICD-10-CM

## 2015-09-30 DIAGNOSIS — C541 Malignant neoplasm of endometrium: Secondary | ICD-10-CM | POA: Diagnosis not present

## 2015-09-30 DIAGNOSIS — R002 Palpitations: Secondary | ICD-10-CM | POA: Insufficient documentation

## 2015-09-30 DIAGNOSIS — Z510 Encounter for antineoplastic radiation therapy: Secondary | ICD-10-CM | POA: Diagnosis not present

## 2015-09-30 DIAGNOSIS — W881XXD Exposure to radioactive isotopes, subsequent encounter: Secondary | ICD-10-CM | POA: Diagnosis not present

## 2015-09-30 DIAGNOSIS — I10 Essential (primary) hypertension: Secondary | ICD-10-CM

## 2015-09-30 DIAGNOSIS — Z888 Allergy status to other drugs, medicaments and biological substances status: Secondary | ICD-10-CM | POA: Diagnosis not present

## 2015-09-30 DIAGNOSIS — R197 Diarrhea, unspecified: Secondary | ICD-10-CM

## 2015-09-30 DIAGNOSIS — Z79899 Other long term (current) drug therapy: Secondary | ICD-10-CM | POA: Diagnosis not present

## 2015-09-30 NOTE — Assessment & Plan Note (Addendum)
Patient having constant palpitation for 2 weeks. Had overall normal labs 1 week ago except K 3.4. She is taking PO K now.  EKG looks normal, HR 89 NSR, no ST changes. ECHO 5 yrs ago normal. No SOB or chest pain. Not hypoxic or tachycardic. No signs of DVT. Risk for PE is overall low, only risk factor is her endometrial cancer.  Will not do any PE workup at this time with her low probability.  Will refer to cardiology for cardiac monitor placement and further recs.

## 2015-09-30 NOTE — Assessment & Plan Note (Signed)
Vitals:   09/30/15 1342  BP: (!) 151/77  Pulse: 93  Temp: 98.2 F (36.8 C)   BP elevated today. Has been taking her meds. On spironolactone 25mg  daily + amlodipine 10mg  daily currently. I think her BP is up given the anxiety and palpitations that she has currently. Will defer any BP med changes for now. Will bring her back in 6 weeks and recheck that time and make further changes if needed.

## 2015-09-30 NOTE — Patient Instructions (Signed)
Please take imodium along with your lomotril as needed for the diarrhea.  Follow up with me in 6 weeks after you see your cardiologist.  If you have any shortness of breathe, chest pain, leg swelling or calf pain, or any other new symptoms please see a doctor immediately.

## 2015-09-30 NOTE — Assessment & Plan Note (Signed)
Having radiation induced diarrhea. Getting better on lomotil. Tried imodium in the past.  Will have her continue lomotil as needed for diarrhea. Asked her to add imodium to it if still not getting any relief.

## 2015-09-30 NOTE — Progress Notes (Signed)
   CC: palpitation  HPI:  Lydia Walsh is a 60 y.o. hx of endometrial cancer (getting chemo and radiation), and other PMH as listed below is here for palpitations.    Past Medical History:  Diagnosis Date  . Cancer [facility])    endometrial cancer  . Endometrial polyp   . GERD (gastroesophageal reflux disease)   . Heart murmur   . History of uterine fibroid   . Hyperlipidemia   . Hypertension   . Hypothyroidism   . OSA (obstructive sleep apnea)    moderate OSA per study 06-21-2005--  per pt does need cpap anymore never purchased it  . Osteoarthritis   . PMB (postmenopausal bleeding)   . Wears dentures    upper  . Wears glasses    Patient has been getting chemo with carboplatin taxol with granix/neupogen. Has been having palpitation for last 2 weeks. Started all the sudden and had constant palpitation for last 2 weeks. Activity worsens the palpitations. No chest pain or SOB with this. No dizziness.  Had labs 7 days ago with normal CMET except K3.4 and CBC showing WBC 4.4, hgb 12.2, TSH on 06/2015 was 4.140. No calm swelling/tenderness, no hx of PE.   Never had heart problem in the past. ECHO on 03/2010 was normal.   Also having diarrhea. Lomotil is not helping. Not on imodium currently. Diarrhea is 2/2 to radiation therapy most likely. Had 3x non bloody loose stools.   Review of Systems:    Review of Systems  Constitutional: Negative for chills and fever.  HENT: Negative for congestion.   Respiratory: Negative for cough, sputum production and shortness of breath.   Cardiovascular: Positive for palpitations. Negative for chest pain, orthopnea, claudication and leg swelling.  Gastrointestinal: Negative for blood in stool and nausea.  Genitourinary: Negative for dysuria.  Skin: Negative for rash.  Neurological: Negative for dizziness, focal weakness, weakness and headaches.     Physical Exam: Vitals:   09/30/15 1342  BP: (!) 151/77  Pulse: 93  Temp: 98.2 F (36.8 C)    Physical Exam  Constitutional: She is oriented to person, place, and time. She appears well-developed and well-nourished. No distress.  HENT:  Head: Normocephalic and atraumatic.  Eyes: Conjunctivae are normal.  Cardiovascular:  Systolic murmur. Normal heart rate and rhythm.   Respiratory: Effort normal and breath sounds normal. No respiratory distress. She has no wheezes.  GI: Soft. Bowel sounds are normal. She exhibits no distension. There is no tenderness.  Musculoskeletal:  No calf tenderness. Trace edema equally on b/l Lower exts.   Neurological: She is alert and oriented to person, place, and time. No cranial nerve deficit.  Skin: She is not diaphoretic.    Assessment & Plan:   See Encounters Tab for problem based charting.  Patient discussed with Dr. Dareen Piano

## 2015-10-01 ENCOUNTER — Ambulatory Visit
Admission: RE | Admit: 2015-10-01 | Discharge: 2015-10-01 | Disposition: A | Discharge status: Home / Self Care | Payer: BLUE CROSS/BLUE SHIELD | Source: Ambulatory Visit | Attending: Radiation Oncology | Admitting: Radiation Oncology

## 2015-10-01 ENCOUNTER — Encounter: Payer: Self-pay | Admitting: Radiation Oncology

## 2015-10-01 VITALS — BP 136/68 | HR 88 | Temp 98.1°F | Ht 60.0 in | Wt 175.5 lb

## 2015-10-01 DIAGNOSIS — C541 Malignant neoplasm of endometrium: Secondary | ICD-10-CM | POA: Diagnosis not present

## 2015-10-01 DIAGNOSIS — Z510 Encounter for antineoplastic radiation therapy: Secondary | ICD-10-CM | POA: Diagnosis not present

## 2015-10-01 DIAGNOSIS — Z79899 Other long term (current) drug therapy: Secondary | ICD-10-CM | POA: Diagnosis not present

## 2015-10-01 DIAGNOSIS — Z888 Allergy status to other drugs, medicaments and biological substances status: Secondary | ICD-10-CM | POA: Diagnosis not present

## 2015-10-01 NOTE — Progress Notes (Signed)
  Radiation Oncology         (336) [phone] ________________________________  Name: Lydia Walsh MRN: [id number]  Date: 10/01/2015  DOB: [birth year]  Weekly Radiation Therapy Management      ICD-9-CM ICD-10-CM   1. Endometrial cancer (HCC) 182.0 C54.1      Stage IIIC-1 Grade 2 endometrioid endometrial cancer  Current Dose: 40 Gy     Planned Dose:  50+ Gy  Narrative . . . . . . . . The patient presents for routine under treatment assessment.                           Lydia Walsh has completed 20 fractions to her pelvis.  She denies having pain, bladder issues or vaginal/rectal bleeding.  She reports having diarrhea about 3 times per day.  She is taking 2 Imodium tablets per day.  She said that the lomotil does not see to help as much as the Imodium.  She is worried about dehydration and is wondering if she needs IV fluids.  Orthostatic vitals taken: bp sitting 136/88, hr 88, bp standing 146/74, hr 96.  She reports having some itching in her vaginal area.  She also feels fatigued.    .           Set-up films were reviewed.                                 The chart was checked. Physical Findings. . .  height is 5' (1.524 m) and weight is 175 lb 8 oz (79.6 kg). Her oral temperature is 98.1 F (36.7 C). Her blood pressure is 136/68 and her pulse is 88. Her oxygen saturation is 100%.  Lungs are clear to auscultation bilaterally. Heart has regular rate and rhythm. Abdomen soft, non-tender, normal bowel sounds. Impression . . . . . . . The patient is tolerating radiation. Plan . . . . . . . . . . . . Continue treatment as planned. I instructed the patient to try and take in more fluids at home if possible to avoid IV fluids upstairs. She will be scheduled for her brachytx starting in early October. ________________________________   Blair Promise, PhD, MD This document serves as a record of services personally performed by Gery Pray, MD. It was created on his behalf by Truddie Hidden, a  trained medical scribe. The creation of this record is based on the scribe's personal observations and the provider's statements to them. This document has been checked and approved by the attending provider.

## 2015-10-01 NOTE — Progress Notes (Signed)
Lydia Walsh has completed 20 fractions to her pelvis.  She denies having pain, bladder issues or vaginal/rectal bleeding.  She reports having diarrhea about 3 times per day.  She is taking 2 Imodium tablets per day.  She said that the lomotil does not see to help as much as the Imodium.  She is worried about dehydration and is wondering if she needs IV fluids.  Orthostatic vitals taken: bp sitting 136/88, hr 88, bp standing 146/74, hr 96.  She reports having some itching in her vaginal area.  She also feels fatigued.    BP 136/68 (BP Location: Right Arm, Patient Position: Sitting)   Pulse 88   Temp 98.1 F (36.7 C) (Oral)   Ht 5' (1.524 m)   Wt 175 lb 8 oz (79.6 kg)   LMP 07/16/2014 Comment: pt states she seen a mild amount of blood( spotting)  SpO2 100%   BMI 34.27 kg/m    Wt Readings from Last 3 Encounters:  10/01/15 175 lb 8 oz (79.6 kg)  09/30/15 177 lb 3.2 oz (80.4 kg)  09/24/15 178 lb 1.6 oz (80.8 kg)

## 2015-10-02 ENCOUNTER — Ambulatory Visit
Admission: RE | Admit: 2015-10-02 | Discharge: 2015-10-02 | Disposition: A | Discharge status: Home / Self Care | Payer: BLUE CROSS/BLUE SHIELD | Source: Ambulatory Visit | Attending: Radiation Oncology | Admitting: Radiation Oncology

## 2015-10-02 DIAGNOSIS — Z510 Encounter for antineoplastic radiation therapy: Secondary | ICD-10-CM | POA: Diagnosis not present

## 2015-10-02 DIAGNOSIS — Z888 Allergy status to other drugs, medicaments and biological substances status: Secondary | ICD-10-CM | POA: Diagnosis not present

## 2015-10-02 DIAGNOSIS — Z79899 Other long term (current) drug therapy: Secondary | ICD-10-CM | POA: Diagnosis not present

## 2015-10-02 DIAGNOSIS — C541 Malignant neoplasm of endometrium: Secondary | ICD-10-CM | POA: Diagnosis not present

## 2015-10-03 ENCOUNTER — Ambulatory Visit
Admission: RE | Admit: 2015-10-03 | Discharge: 2015-10-03 | Disposition: A | Discharge status: Home / Self Care | Payer: BLUE CROSS/BLUE SHIELD | Source: Ambulatory Visit | Attending: Radiation Oncology | Admitting: Radiation Oncology

## 2015-10-03 DIAGNOSIS — C541 Malignant neoplasm of endometrium: Secondary | ICD-10-CM | POA: Diagnosis not present

## 2015-10-03 DIAGNOSIS — Z888 Allergy status to other drugs, medicaments and biological substances status: Secondary | ICD-10-CM | POA: Diagnosis not present

## 2015-10-03 DIAGNOSIS — Z79899 Other long term (current) drug therapy: Secondary | ICD-10-CM | POA: Diagnosis not present

## 2015-10-03 DIAGNOSIS — Z510 Encounter for antineoplastic radiation therapy: Secondary | ICD-10-CM | POA: Diagnosis not present

## 2015-10-03 NOTE — Progress Notes (Signed)
Internal Medicine Clinic Attending  Case discussed with Dr. Ahmed at the time of the visit.  We reviewed the resident's history and exam and pertinent patient test results.  I agree with the assessment, diagnosis, and plan of care documented in the resident's note. 

## 2015-10-04 ENCOUNTER — Ambulatory Visit
Admission: RE | Admit: 2015-10-04 | Discharge: 2015-10-04 | Disposition: A | Discharge status: Home / Self Care | Payer: BLUE CROSS/BLUE SHIELD | Source: Ambulatory Visit | Attending: Radiation Oncology | Admitting: Radiation Oncology

## 2015-10-04 DIAGNOSIS — C541 Malignant neoplasm of endometrium: Secondary | ICD-10-CM | POA: Diagnosis not present

## 2015-10-04 DIAGNOSIS — Z888 Allergy status to other drugs, medicaments and biological substances status: Secondary | ICD-10-CM | POA: Diagnosis not present

## 2015-10-04 DIAGNOSIS — Z79899 Other long term (current) drug therapy: Secondary | ICD-10-CM | POA: Diagnosis not present

## 2015-10-04 DIAGNOSIS — Z510 Encounter for antineoplastic radiation therapy: Secondary | ICD-10-CM | POA: Diagnosis not present

## 2015-10-07 ENCOUNTER — Ambulatory Visit: Payer: BLUE CROSS/BLUE SHIELD

## 2015-10-07 ENCOUNTER — Ambulatory Visit
Admission: RE | Admit: 2015-10-07 | Discharge: 2015-10-07 | Disposition: A | Discharge status: Home / Self Care | Payer: BLUE CROSS/BLUE SHIELD | Source: Ambulatory Visit | Attending: Radiation Oncology | Admitting: Radiation Oncology

## 2015-10-07 DIAGNOSIS — Z510 Encounter for antineoplastic radiation therapy: Secondary | ICD-10-CM | POA: Diagnosis not present

## 2015-10-07 DIAGNOSIS — C541 Malignant neoplasm of endometrium: Secondary | ICD-10-CM | POA: Diagnosis not present

## 2015-10-07 DIAGNOSIS — Z888 Allergy status to other drugs, medicaments and biological substances status: Secondary | ICD-10-CM | POA: Diagnosis not present

## 2015-10-07 DIAGNOSIS — Z79899 Other long term (current) drug therapy: Secondary | ICD-10-CM | POA: Diagnosis not present

## 2015-10-08 ENCOUNTER — Encounter: Payer: Self-pay | Admitting: Radiation Oncology

## 2015-10-08 ENCOUNTER — Ambulatory Visit: Payer: BLUE CROSS/BLUE SHIELD

## 2015-10-08 ENCOUNTER — Ambulatory Visit
Admission: RE | Admit: 2015-10-08 | Discharge: 2015-10-08 | Disposition: A | Discharge status: Home / Self Care | Payer: BLUE CROSS/BLUE SHIELD | Source: Ambulatory Visit | Attending: Radiation Oncology | Admitting: Radiation Oncology

## 2015-10-08 VITALS — BP 119/71 | HR 88 | Temp 98.4°F | Ht 60.0 in | Wt 174.8 lb

## 2015-10-08 DIAGNOSIS — Z510 Encounter for antineoplastic radiation therapy: Secondary | ICD-10-CM | POA: Diagnosis not present

## 2015-10-08 DIAGNOSIS — R197 Diarrhea, unspecified: Secondary | ICD-10-CM

## 2015-10-08 DIAGNOSIS — C541 Malignant neoplasm of endometrium: Secondary | ICD-10-CM | POA: Diagnosis not present

## 2015-10-08 DIAGNOSIS — Z888 Allergy status to other drugs, medicaments and biological substances status: Secondary | ICD-10-CM | POA: Diagnosis not present

## 2015-10-08 DIAGNOSIS — Z79899 Other long term (current) drug therapy: Secondary | ICD-10-CM | POA: Diagnosis not present

## 2015-10-08 MED ORDER — DIPHENOXYLATE-ATROPINE 2.5-0.025 MG PO TABS: 2.0000 | ORAL_TABLET | Freq: Four times a day (QID) | ORAL | 1 refills | Status: DC | PRN

## 2015-10-08 MED ORDER — LOPERAMIDE HCL 2 MG PO TABS: 2.0000 mg | ORAL_TABLET | Freq: Four times a day (QID) | ORAL | 1 refills | Status: DC | PRN

## 2015-10-08 NOTE — Progress Notes (Signed)
  Radiation Oncology         (336) [phone] ________________________________  Name: Lydia Walsh MRN: [id number]  Date: 10/08/2015  DOB: Feb 01, 1955  Weekly Radiation Therapy Management    ICD-9-CM ICD-10-CM   1. Endometrial cancer (HCC) 182.0 C54.1   2. Diarrhea, unspecified type 787.91 R19.7 diphenoxylate-atropine (LOMOTIL) 2.5-0.025 MG tablet     Current Dose: 50 Gy     Planned Dose:  50+ Gy  Narrative . . . . . . . . The patient presents for routine under treatment assessment.                                   Mercury has completed 24 fractions to her pelvis.  She denies having pain.  She reports that the skin in her vaginal area has been itching which causes some dysuria.  She denies having any rectal/vaginal bleeding.  She reports having diarrhea 3 times per day.  She has been taking 4 imodium tablets per day which she alternates with 2 tablets of lomotil.  She is requested a refill on lomotil.  She reports having fatigue.                                 Set-up films were reviewed.                                 The chart was checked. Physical Findings. . .  height is 5' (1.524 m) and weight is 174 lb 12.8 oz (79.3 kg). Her oral temperature is 98.4 F (36.9 C). Her blood pressure is 119/71 and her pulse is 88. Her oxygen saturation is 99%. . Weight essentially stable.  The lungs are clear. The heart has a regular rhythm and rate. The abdomen is soft and nontender with normal bowel sounds Impression . . . . . . . The patient is tolerating radiation. Plan . . . . . . . . . . . . Continue treatment as planned. Lomotil prescription refilled. The patient will be set up to start her HDR treatments in the next several days.  ________________________________   Blair Promise, PhD, MD

## 2015-10-08 NOTE — Progress Notes (Signed)
Lydia Walsh has completed 24 fractions to her pelvis.  She denies having pain.  She reports that the skin in her vaginal area has been itching which causes some dysuria.  She denies having any rectal/vaginal bleeding.  She reports having diarrhea 3 times per day.  She has been taking 4 imodium tablets per day which she alternates with 2 tablets of lomotil.  She is requested a refill on lomotil.  She reports having fatigue.  BP 119/71 (BP Location: Right Arm, Patient Position: Sitting)   Pulse 88   Temp 98.4 F (36.9 C) (Oral)   Ht 5' (1.524 m)   Wt 174 lb 12.8 oz (79.3 kg)   LMP 07/16/2014 Comment: pt states she seen a mild amount of blood( spotting)  SpO2 99%   BMI 34.14 kg/m    Wt Readings from Last 3 Encounters:  10/08/15 174 lb 12.8 oz (79.3 kg)  10/01/15 175 lb 8 oz (79.6 kg)  09/30/15 177 lb 3.2 oz (80.4 kg)

## 2015-10-09 ENCOUNTER — Ambulatory Visit: Payer: BLUE CROSS/BLUE SHIELD

## 2015-10-14 ENCOUNTER — Telehealth: Payer: Self-pay | Admitting: *Deleted

## 2015-10-14 ENCOUNTER — Other Ambulatory Visit: Payer: Self-pay | Admitting: Internal Medicine

## 2015-10-14 NOTE — Telephone Encounter (Signed)
Called patient to remind of HDR Case for 10-15-15, spoke with patient and she is aware of these appts.

## 2015-10-14 NOTE — Telephone Encounter (Signed)
Based on RF h/x not taking QD. Will fill and sch PCP appt for F/U

## 2015-10-15 ENCOUNTER — Ambulatory Visit
Admission: RE | Admit: 2015-10-15 | Discharge: 2015-10-15 | Disposition: A | Discharge status: Home / Self Care | Payer: BLUE CROSS/BLUE SHIELD | Source: Ambulatory Visit | Attending: Radiation Oncology | Admitting: Radiation Oncology

## 2015-10-15 ENCOUNTER — Encounter: Payer: Self-pay | Admitting: Radiation Oncology

## 2015-10-15 DIAGNOSIS — C541 Malignant neoplasm of endometrium: Secondary | ICD-10-CM

## 2015-10-15 DIAGNOSIS — Z888 Allergy status to other drugs, medicaments and biological substances status: Secondary | ICD-10-CM | POA: Diagnosis not present

## 2015-10-15 DIAGNOSIS — Z510 Encounter for antineoplastic radiation therapy: Secondary | ICD-10-CM | POA: Diagnosis not present

## 2015-10-15 DIAGNOSIS — Z79899 Other long term (current) drug therapy: Secondary | ICD-10-CM | POA: Diagnosis not present

## 2015-10-15 NOTE — Progress Notes (Signed)
Radiation Oncology         [phone]) [phone] ________________________________  Name: Lydia Walsh MRN: [id number]  Date: 10/15/2015  DOB: 09/16/1955  Vaginal Brachytherapy Procedure Note  CC: Dellia Nims, MD Dellia Nims, MD    ICD-9-CM ICD-10-CM   1. Endometrial cancer ([place]) 182.0 C54.1     Diagnosis: Endometrial cancer    Narrative: She returns today for vaginal cylinder fitting.   She denies having pain, bladder issues, vaginal/rectal bleeding or discharge.  She reports having loose bowel movements 2-3 times a day.  She is not taking Imodium or lomotil now.  She reports her energy level is good.  She will have chemotherapy tomorrow.   ALLERGIES: is allergic to hctz [hydrochlorothiazide].  Meds: Current Outpatient Prescriptions  Medication Sig Dispense Refill  . allopurinol (ZYLOPRIM) 100 MG tablet TAKE ONE TABLET BY MOUTH ONCE DAILY 90 tablet 1  . amLODipine (NORVASC) 10 MG tablet TAKE ONE TABLET BY MOUTH ONCE DAILY 30 tablet 0  . dexamethasone (DECADRON) 4 MG tablet Take 5 tablets with food 12 hrs and 6 hrs before taxol Chemotherapy. 10 tablet 2  . Ferrous Fumarate (HEMOCYTE - 106 MG FE) 324 (106 Fe) MG TABS tablet Take 1 tablet (106 mg of iron total) by mouth daily. On an empty stomach with OJ 30 tablet 4  . ibuprofen (ADVIL,MOTRIN) 200 MG tablet Take 200 mg by mouth every 6 (six) hours as needed for mild pain. Reported on 07/15/2015    . levothyroxine (SYNTHROID, LEVOTHROID) 112 MCG tablet Take 1 tablet (112 mcg total) by mouth daily before breakfast. 90 tablet 0  . lidocaine-prilocaine (EMLA) cream Apply to port 1-2 hours before accessing with a needle. Cover with plastic wrap. 30 g 1  . potassium chloride (K-DUR) 10 MEQ tablet Take 1 tablet (10 mEq total) by mouth daily. 14 tablet 0  . simvastatin (ZOCOR) 20 MG tablet Take 1 tablet (20 mg total) by mouth every evening. 30 tablet 11  . spironolactone (ALDACTONE) 25 MG tablet Take 25 mg by mouth daily.  3  . vitamin E  (VITAMIN E) 400 UNIT capsule Take 400 Units by mouth daily.    . diphenoxylate-atropine (LOMOTIL) 2.5-0.025 MG tablet Take 2 tablets by mouth 4 (four) times daily as needed for diarrhea or loose stools. (Patient not taking: Reported on 10/15/2015) 30 tablet 1  . docusate sodium (COLACE) 100 MG capsule Take 1 capsule (100 mg total) by mouth 2 (two) times daily. (Patient not taking: Reported on 10/15/2015) 30 capsule 0  . HYDROcodone-acetaminophen (NORCO/VICODIN) 5-325 MG tablet Take 1 tablet by mouth every 4 (four) hours as needed for severe pain. (Patient not taking: Reported on 10/15/2015) 10 tablet 0  . loperamide (IMODIUM A-D) 2 MG tablet Take 1 tablet (2 mg total) by mouth 4 (four) times daily as needed for diarrhea or loose stools. (Patient not taking: Reported on 10/15/2015) 30 tablet 1  . LORazepam (ATIVAN) 0.5 MG tablet Place 1 tablet under the tongue or swallow every 6 hours as needed for nausea. Will make sleepy. (Patient not taking: Reported on 10/15/2015) 20 tablet 0  . omeprazole (PRILOSEC) 20 MG capsule Take 1 capsule (20 mg total) by mouth daily. (Patient not taking: Reported on 10/15/2015) 30 capsule 2  . ondansetron (ZOFRAN) 8 MG tablet Take 1 tablet (8 mg total) by mouth every 8 (eight) hours as needed for nausea. Will make drowsy. (Patient not taking: Reported on 10/15/2015) 30 tablet 1   Current Facility-Administered Medications  Medication Dose Route Frequency  Provider Last Rate Last Dose  . sodium chloride 0.9 % injection 10 mL  10 mL Intravenous Once Gery Pray, MD        Physical Findings: The patient is in no acute distress. Patient is alert and oriented.  height is 5' (1.524 m) and weight is 173 lb (78.5 kg). Her oral temperature is 98.8 F (37.1 C). Her blood pressure is 140/84 and her pulse is 88. Her oxygen saturation is 100%.   No palpable cervical, supraclavicular or axillary lymphoadenopathy. The heart has a regular rate and rhythm. The lungs are clear to auscultation.  Abdomen soft and non-tender.  On pelvic examination the external genitalia were unremarkable. A speculum exam was performed. Vaginal cuff intact, no mucosal lesions. On bimanual exam there were no pelvic masses appreciated.  Patient was fitted for a vaginal cylinder. The patient will be treated with a 3 cm diameter cylinder with a treatment length of 3 cm. This distended the vaginal vault without undue discomfort. The patient tolerated the procedure well.  Lab Findings: Lab Results  Component Value Date   WBC 4.4 09/23/2015   HGB 12.2 09/23/2015   HCT 37.7 09/23/2015   MCV 88.4 09/23/2015   PLT 141 (L) 09/23/2015    Radiographic Findings: No results found.  Impression:   The patient was successfully fitted for a vaginal cylinder. The patient is appropriate to begin vaginal brachytherapy.    Plan: The patient will proceed with CT simulation and vaginal brachytherapy today.    _______________________________   Blair Promise, PhD, MD  This document serves as a record of services personally performed by Gery Pray, MD. It was created on his behalf by Truddie Hidden, a trained medical scribe. The creation of this record is based on the scribe's personal observations and the provider's statements to them. This document has been checked and approved by the attending provider.

## 2015-10-15 NOTE — Progress Notes (Signed)
  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  Identity was confirmed.  All relevant records and images related to the planned course of therapy were reviewed.  The patient freely provided informed written consent to proceed with treatment after reviewing the details related to the planned course of therapy. The consent form was witnessed and verified by the simulation staff.  Then, the patient was set-up in a stable reproducible  supine position for radiation therapy.  CT images were obtained.  Surface markings were placed.  The CT images were loaded into the planning software.  Then the target and avoidance structures were contoured.  Treatment planning then occurred.  The radiation prescription was entered and confirmed.   I have requested : Brachytherapy Isodose Plan and Dosimetry Calculations to plan the radiation distribution.    PLAN:  The patient will receive 18 Gy in 3 fractions.  Patient will be treated with a 3 cm diameter cylinder. Treatment length will be 3 cm. Prescription will be to the mucosal surface. Iridium 192 will be the high-dose-rate source.    ________________________________  Blair Promise, PhD, MD  This document serves as a record of services personally performed by Gery Pray, MD. It was created on his behalf by Truddie Hidden, a trained medical scribe. The creation of this record is based on the scribe's personal observations and the provider's statements to them. This document has been checked and approved by the attending provider.

## 2015-10-15 NOTE — Progress Notes (Signed)
  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 She will be treated with a 3.0 cm diameter segmented cylinder. This conforms to her anatomy without undue discomfort.  Vaginal brachytherapy procedure node: The patient was brought to the [place] suite. Identity was confirmed. All relevant records and images related to the planned course of therapy were reviewed. The patient freely provided informed written consent to proceed with treatment after reviewing the details related to the planned course of therapy. The consent form was witnessed and verified by the simulation staff. Then, the patient was set-up in a stable reproducible supine position for radiation therapy. The patient's custom vaginal cylinder was placed in the proximal vagina. This was affixed to the CT/MR stabilization plate to prevent slippage. Patient tolerated the placement well.  Verification simulation note:  A fiducial marker was placed within the vaginal cylinder. An AP and lateral film was then obtained through the pelvis area. This documented accurate position of the vaginal cylinder for treatment.  HDR BRACHYTHERAPY TREATMENT  The remote afterloading device was affixed to the vaginal cylinder by catheter. Patient then proceeded to undergo her first high-dose-rate treatment directed at the proximal vagina. The patient was prescribed a dose of 6.0 gray to be delivered to the mucosal surface. Treatment length was 3.0 cm. Patient was treated with 1 channel using 7 dwell positions. Treatment time was 400.1 seconds. Iridium 192 was the high-dose-rate source for treatment. The patient tolerated the treatment well.  After completion of her therapy, a radiation survey was performed documenting return of the iridium source into the Gamma[employer] safe.   PLAN: Patient will return next week for her second high-dose-rate treatment ________________________________  Blair Promise, PhD, MD

## 2015-10-15 NOTE — Progress Notes (Signed)
Lydia Walsh is here for follow up/HDR.  She denies having pain, bladder issues, vaginal/rectal bleeding or discharge.  She reports having loose bowel movements 2-3 times a day.  She is not taking Imodium or lomotil now.  She reports her energy level is good.  She will have chemotherapy tomorrow.  BP 140/84 (BP Location: Right Arm, Patient Position: Sitting)   Pulse 88   Temp 98.8 F (37.1 C) (Oral)   Ht 5' (1.524 m)   Wt 173 lb (78.5 kg)   LMP 07/16/2014 Comment: pt states she seen a mild amount of blood( spotting)  SpO2 100%   BMI 33.79 kg/m    Wt Readings from Last 3 Encounters:  10/15/15 173 lb (78.5 kg)  10/08/15 174 lb 12.8 oz (79.3 kg)  10/01/15 175 lb 8 oz (79.6 kg)

## 2015-10-16 ENCOUNTER — Other Ambulatory Visit (HOSPITAL_BASED_OUTPATIENT_CLINIC_OR_DEPARTMENT_OTHER): Payer: BLUE CROSS/BLUE SHIELD

## 2015-10-16 ENCOUNTER — Ambulatory Visit (HOSPITAL_BASED_OUTPATIENT_CLINIC_OR_DEPARTMENT_OTHER): Payer: BLUE CROSS/BLUE SHIELD

## 2015-10-16 ENCOUNTER — Telehealth: Payer: Self-pay | Admitting: *Deleted

## 2015-10-16 VITALS — BP 139/84 | HR 109 | Temp 98.2°F | Resp 16

## 2015-10-16 DIAGNOSIS — Z5111 Encounter for antineoplastic chemotherapy: Secondary | ICD-10-CM

## 2015-10-16 DIAGNOSIS — C541 Malignant neoplasm of endometrium: Secondary | ICD-10-CM

## 2015-10-16 LAB — CBC WITH DIFFERENTIAL/PLATELET
BASO%: 0.2 % (ref 0.0–2.0)
Basophils Absolute: 0 10e3/uL (ref 0.0–0.1)
EOS%: 0 % (ref 0.0–7.0)
Eosinophils Absolute: 0 10e3/uL (ref 0.0–0.5)
HCT: 40 % (ref 34.8–46.6)
HGB: 13.7 g/dL (ref 11.6–15.9)
LYMPH%: 4.2 % — ABNORMAL LOW (ref 14.0–49.7)
MCH: 30 pg (ref 25.1–34.0)
MCHC: 34.3 g/dL (ref 31.5–36.0)
MCV: 87.5 fL (ref 79.5–101.0)
MONO#: 0 10e3/uL — ABNORMAL LOW (ref 0.1–0.9)
MONO%: 0.5 % (ref 0.0–14.0)
NEUT#: 5.8 10e3/uL (ref 1.5–6.5)
NEUT%: 95.1 % — ABNORMAL HIGH (ref 38.4–76.8)
Platelets: 202 10e3/uL (ref 145–400)
RBC: 4.57 10e6/uL (ref 3.70–5.45)
RDW: 14.6 % — ABNORMAL HIGH (ref 11.2–14.5)
WBC: 6.1 10e3/uL (ref 3.9–10.3)
lymph#: 0.3 10e3/uL — ABNORMAL LOW (ref 0.9–3.3)

## 2015-10-16 LAB — COMPREHENSIVE METABOLIC PANEL
ALBUMIN: 4 g/dL (ref 3.5–5.0)
ALK PHOS: 116 U/L (ref 40–150)
ALT: 34 U/L (ref 0–55)
ANION GAP: 14 meq/L — AB (ref 3–11)
AST: 28 U/L (ref 5–34)
BUN: 12.1 mg/dL (ref 7.0–26.0)
CALCIUM: 10.4 mg/dL (ref 8.4–10.4)
CO2: 24 mEq/L (ref 22–29)
CREATININE: 0.7 mg/dL (ref 0.6–1.1)
Chloride: 102 mEq/L (ref 98–109)
EGFR: >90 mL/min/{1.73_m2} (ref >90)
Glucose: 175 mg/dl — ABNORMAL HIGH (ref 70–140)
Potassium: 3.6 mEq/L (ref 3.5–5.1)
Sodium: 140 mEq/L (ref 136–145)
Total Bilirubin: 0.8 mg/dL (ref 0.20–1.20)
Total Protein: 8.1 g/dL (ref 6.4–8.3)

## 2015-10-16 LAB — MAGNESIUM: MAGNESIUM: 2.1 mg/dL (ref 1.5–2.5)

## 2015-10-16 MED ORDER — SODIUM CHLORIDE 0.9% FLUSH
10.0000 mL | INTRAVENOUS | Status: DC | PRN
  Administered 2015-10-16: 10 mL
  Filled 2015-10-16: qty 10

## 2015-10-16 MED ORDER — DIPHENHYDRAMINE HCL 50 MG/ML IJ SOLN
25.0000 mg | Freq: Once | INTRAMUSCULAR | Status: AC
  Administered 2015-10-16: 25 mg via INTRAVENOUS

## 2015-10-16 MED ORDER — SODIUM CHLORIDE 0.9 % IV SOLN
175.0000 mg/m2 | Freq: Once | INTRAVENOUS | Status: AC
  Administered 2015-10-16: 324 mg via INTRAVENOUS
  Filled 2015-10-16: qty 54

## 2015-10-16 MED ORDER — SODIUM CHLORIDE 0.9 % IV SOLN
Freq: Once | INTRAVENOUS | Status: AC
  Administered 2015-10-16: 10:00:00 via INTRAVENOUS

## 2015-10-16 MED ORDER — ONDANSETRON HCL 40 MG/20ML IJ SOLN
Freq: Once | INTRAMUSCULAR | Status: AC
  Administered 2015-10-16: 10:00:00 via INTRAVENOUS
  Filled 2015-10-16: qty 4

## 2015-10-16 MED ORDER — SODIUM CHLORIDE 0.9 % IV SOLN
Freq: Once | INTRAVENOUS | Status: AC
  Administered 2015-10-16: 11:00:00 via INTRAVENOUS
  Filled 2015-10-16: qty 5

## 2015-10-16 MED ORDER — HEPARIN SOD (PORK) LOCK FLUSH 100 UNIT/ML IV SOLN
500.0000 [IU] | Freq: Once | INTRAVENOUS | Status: AC | PRN
  Administered 2015-10-16: 500 [IU]
  Filled 2015-10-16: qty 5

## 2015-10-16 MED ORDER — CARBOPLATIN CHEMO INJECTION 600 MG/60ML
478.4000 mg | Freq: Once | INTRAVENOUS | Status: AC
  Administered 2015-10-16: 480 mg via INTRAVENOUS
  Filled 2015-10-16: qty 48

## 2015-10-16 MED ORDER — LORAZEPAM 2 MG/ML IJ SOLN
0.5000 mg | Freq: Once | INTRAMUSCULAR | Status: AC | PRN
  Administered 2015-10-16: 0.5 mg via INTRAVENOUS

## 2015-10-16 MED ORDER — FAMOTIDINE IN NACL 20-0.9 MG/50ML-% IV SOLN
20.0000 mg | Freq: Once | INTRAVENOUS | Status: AC
  Administered 2015-10-16: 20 mg via INTRAVENOUS

## 2015-10-16 NOTE — Patient Instructions (Signed)
[facility] Discharge Instructions for Patients Receiving Chemotherapy  Today you received the following chemotherapy agents Taxol and Carboplatin. To help prevent nausea and vomiting after your treatment, we encourage you to take your nausea medication as directed.  If you develop nausea and vomiting that is not controlled by your nausea medication, call the clinic.   BELOW ARE SYMPTOMS THAT SHOULD BE REPORTED IMMEDIATELY:  *FEVER GREATER THAN 100.5 F  *CHILLS WITH OR WITHOUT FEVER  NAUSEA AND VOMITING THAT IS NOT CONTROLLED WITH YOUR NAUSEA MEDICATION  *UNUSUAL SHORTNESS OF BREATH  *UNUSUAL BRUISING OR BLEEDING  TENDERNESS IN MOUTH AND THROAT WITH OR WITHOUT PRESENCE OF ULCERS  *URINARY PROBLEMS  *BOWEL PROBLEMS  UNUSUAL RASH Items with * indicate a potential emergency and should be followed up as soon as possible.  Feel free to call the clinic you have any questions or concerns. The clinic phone number is [phone].  Please show the CHEMO ALERT CARD at check-in to the Emergency Department and triage nurse.    

## 2015-10-16 NOTE — Progress Notes (Signed)
Ok to treat per Alfonse Ras, per MD Magrinat with HR 109

## 2015-10-16 NOTE — Telephone Encounter (Signed)
Called patient to remind of tx. For 10-17-15 @ 10 am, spoke with patient and she is aware of this appt.

## 2015-10-17 ENCOUNTER — Ambulatory Visit (HOSPITAL_BASED_OUTPATIENT_CLINIC_OR_DEPARTMENT_OTHER): Payer: BLUE CROSS/BLUE SHIELD

## 2015-10-17 ENCOUNTER — Ambulatory Visit
Admission: RE | Admit: 2015-10-17 | Discharge: 2015-10-17 | Disposition: A | Discharge status: Home / Self Care | Payer: BLUE CROSS/BLUE SHIELD | Source: Ambulatory Visit | Attending: Radiation Oncology | Admitting: Radiation Oncology

## 2015-10-17 VITALS — BP 134/75 | HR 86 | Temp 98.3°F | Resp 18

## 2015-10-17 DIAGNOSIS — C541 Malignant neoplasm of endometrium: Secondary | ICD-10-CM

## 2015-10-17 DIAGNOSIS — Z5189 Encounter for other specified aftercare: Secondary | ICD-10-CM | POA: Diagnosis not present

## 2015-10-17 MED ORDER — TBO-FILGRASTIM 480 MCG/0.8ML ~~LOC~~ SOSY
480.0000 ug | PREFILLED_SYRINGE | Freq: Once | SUBCUTANEOUS | Status: AC
  Administered 2015-10-17: 480 ug via SUBCUTANEOUS
  Filled 2015-10-17: qty 0.8

## 2015-10-17 NOTE — Patient Instructions (Signed)

## 2015-10-18 ENCOUNTER — Ambulatory Visit (HOSPITAL_BASED_OUTPATIENT_CLINIC_OR_DEPARTMENT_OTHER): Payer: BLUE CROSS/BLUE SHIELD

## 2015-10-18 VITALS — BP 134/58 | HR 82 | Temp 98.1°F | Resp 20

## 2015-10-18 DIAGNOSIS — C541 Malignant neoplasm of endometrium: Secondary | ICD-10-CM

## 2015-10-18 DIAGNOSIS — Z5189 Encounter for other specified aftercare: Secondary | ICD-10-CM

## 2015-10-18 MED ORDER — TBO-FILGRASTIM 480 MCG/0.8ML ~~LOC~~ SOSY
480.0000 ug | PREFILLED_SYRINGE | Freq: Once | SUBCUTANEOUS | Status: AC
  Administered 2015-10-18: 480 ug via SUBCUTANEOUS
  Filled 2015-10-18: qty 0.8

## 2015-10-18 NOTE — Patient Instructions (Signed)

## 2015-10-19 ENCOUNTER — Ambulatory Visit (HOSPITAL_BASED_OUTPATIENT_CLINIC_OR_DEPARTMENT_OTHER): Payer: BLUE CROSS/BLUE SHIELD

## 2015-10-19 VITALS — BP 132/70 | HR 97 | Temp 98.6°F | Resp 18

## 2015-10-19 DIAGNOSIS — C541 Malignant neoplasm of endometrium: Secondary | ICD-10-CM

## 2015-10-19 MED ORDER — TBO-FILGRASTIM 480 MCG/0.8ML ~~LOC~~ SOSY
480.0000 ug | PREFILLED_SYRINGE | Freq: Once | SUBCUTANEOUS | Status: AC
  Administered 2015-10-19: 480 ug via SUBCUTANEOUS

## 2015-10-19 NOTE — Patient Instructions (Signed)

## 2015-10-21 ENCOUNTER — Telehealth: Payer: Self-pay | Admitting: *Deleted

## 2015-10-21 ENCOUNTER — Telehealth: Payer: Self-pay | Admitting: Oncology

## 2015-10-21 NOTE — Telephone Encounter (Signed)
Lydia Walsh called and asked if she can have IV fluids scheduled for Wednesday.  She said she is feeling weak ("can barely walk") and is having diarrhea about 3 times a day.  She said she is taking lomotil and imodium.  She denies having a fever.  Advised her that Dr. Sondra Come will be contacted and we will call her back.

## 2015-10-21 NOTE — Telephone Encounter (Signed)
CALLED PATIENT TO REMIND OF HDR TX. FOR 10-22-15 @ 10 AM, SPOKE WITH PATIENT AND SHE IS AWARE OF THIS [place].

## 2015-10-22 ENCOUNTER — Ambulatory Visit
Admission: RE | Admit: 2015-10-22 | Discharge: 2015-10-22 | Disposition: A | Discharge status: Home / Self Care | Payer: BLUE CROSS/BLUE SHIELD | Source: Ambulatory Visit | Attending: Radiation Oncology | Admitting: Radiation Oncology

## 2015-10-22 ENCOUNTER — Other Ambulatory Visit: Payer: Self-pay | Admitting: Oncology

## 2015-10-22 VITALS — BP 143/84 | HR 112 | Temp 98.1°F | Ht 60.0 in | Wt 168.6 lb

## 2015-10-22 DIAGNOSIS — C541 Malignant neoplasm of endometrium: Secondary | ICD-10-CM

## 2015-10-22 DIAGNOSIS — Z79899 Other long term (current) drug therapy: Secondary | ICD-10-CM | POA: Diagnosis not present

## 2015-10-22 DIAGNOSIS — Z510 Encounter for antineoplastic radiation therapy: Secondary | ICD-10-CM | POA: Diagnosis not present

## 2015-10-22 DIAGNOSIS — Z888 Allergy status to other drugs, medicaments and biological substances status: Secondary | ICD-10-CM | POA: Diagnosis not present

## 2015-10-22 NOTE — Progress Notes (Signed)
Lydia Walsh reports feeling tired and weak.  She also reports having a poor appetite and has lost 5 lbs since last week.  She also continue to have diarrhea.  Advised her that fluids have been arranged for her tomorrow at 10 am [facility].  Lydia Walsh verbalized understanding and agreement.  Also called Barbaraann Share, RN with Dr. Marko Plume and notified her of patient being set up for fluids tomorrow.

## 2015-10-22 NOTE — Progress Notes (Signed)
  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 She will be treated with a 3.0 cm diameter segmented cylinder. This conforms to her anatomy without undue discomfort.  Vaginal brachytherapy procedure node: The patient was brought to the [place] suite. Identity was confirmed. All relevant records and images related to the planned course of therapy were reviewed. The patient freely provided informed written consent to proceed with treatment after reviewing the details related to the planned course of therapy. The consent form was witnessed and verified by the simulation staff. Then, the patient was set-up in a stable reproducible supine position for radiation therapy. A pelvic exam was performed. The vaginal cuff was noted to be intact. No pelvic masses appreciated.  The patient's custom vaginal cylinder was placed in the proximal vagina. This was affixed to the CT/MR stabilization plate to prevent slippage. Patient tolerated the placement well.  Verification simulation note:  A fiducial marker was placed within the vaginal cylinder. An AP and lateral film was then obtained through the pelvis area. This documented accurate position of the vaginal cylinder for treatment.  HDR BRACHYTHERAPY TREATMENT  The remote afterloading device was affixed to the vaginal cylinder by catheter. Patient then proceeded to undergo her second high-dose-rate treatment directed at the proximal vagina. The patient was prescribed a dose of 6.0 gray to be delivered to the mucosal surface. Treatment length was 3.0 cm. Patient was treated with 1 channel using 7 dwell positions. Treatment time was 161.2 seconds.  Iridium 192 was the high-dose-rate source for treatment. The patient tolerated the treatment well. After completion of her therapy, a radiation survey was performed documenting return of the iridium source into the Gamma[employer] safe.   PLAN: Patient will return next week for her third  high-dose-rate treatment ________________________________  Blair Promise, PhD, MD   This document serves as a record of services personally performed by Gery Pray, MD. It was created on his behalf by Truddie Hidden, a trained medical scribe. The creation of this record is based on the scribe's personal observations and the provider's statements to them. This document has been checked and approved by the attending provider.

## 2015-10-23 ENCOUNTER — Ambulatory Visit (HOSPITAL_COMMUNITY)
Admission: RE | Admit: 2015-10-23 | Discharge: 2015-10-23 | Disposition: A | Discharge status: Home / Self Care | Payer: BLUE CROSS/BLUE SHIELD | Source: Ambulatory Visit | Attending: Oncology | Admitting: Oncology

## 2015-10-23 ENCOUNTER — Telehealth: Payer: Self-pay | Admitting: *Deleted

## 2015-10-23 DIAGNOSIS — C541 Malignant neoplasm of endometrium: Secondary | ICD-10-CM | POA: Insufficient documentation

## 2015-10-23 MED ORDER — SODIUM CHLORIDE 0.9 % IV SOLN
INTRAVENOUS | Status: AC
  Administered 2015-10-23: 10:00:00 via INTRAVENOUS

## 2015-10-23 MED ORDER — HEPARIN SOD (PORK) LOCK FLUSH 100 UNIT/ML IV SOLN
500.0000 [IU] | INTRAVENOUS | Status: AC | PRN
  Administered 2015-10-23: 500 [IU]
  Filled 2015-10-23: qty 5

## 2015-10-23 MED ORDER — SODIUM CHLORIDE 0.9% FLUSH
10.0000 mL | INTRAVENOUS | Status: AC | PRN
  Administered 2015-10-23: 10 mL

## 2015-10-23 NOTE — Discharge Instructions (Signed)
Infusion of 1000 ml of 0.9% Normal Saline over 2 hours

## 2015-10-23 NOTE — Telephone Encounter (Signed)
CALLED PATIENT TO REMIND OF HDR TX. FOR 10-24-15 @ 10 AM, LVM FOR A RETURN CALL

## 2015-10-23 NOTE — Progress Notes (Signed)
Patient ID: Lydia Walsh, female   DOB: 1955-04-27, 60 y.o.   MRN: [id number]    Provider: Gery Pray MD  Associated Diagnosis: Endometrial cancer ([place]) (C54.1)  Procedure: Infusion of 1000 ml 0.9% Normal Saline via [place] a Cath as ordered.  Patient tolerated infusion well. No adverse signs or symptoms. Went over discharge instructions and copy was given to patient. Alert, oriented and ambulatory at time of discharge. Discharged to home.

## 2015-10-24 ENCOUNTER — Ambulatory Visit
Admission: RE | Admit: 2015-10-24 | Discharge: 2015-10-24 | Disposition: A | Discharge status: Home / Self Care | Payer: BLUE CROSS/BLUE SHIELD | Source: Ambulatory Visit | Attending: Radiation Oncology | Admitting: Radiation Oncology

## 2015-10-24 DIAGNOSIS — C541 Malignant neoplasm of endometrium: Secondary | ICD-10-CM

## 2015-10-24 DIAGNOSIS — Z888 Allergy status to other drugs, medicaments and biological substances status: Secondary | ICD-10-CM | POA: Diagnosis not present

## 2015-10-24 DIAGNOSIS — Z510 Encounter for antineoplastic radiation therapy: Secondary | ICD-10-CM | POA: Diagnosis not present

## 2015-10-24 DIAGNOSIS — Z79899 Other long term (current) drug therapy: Secondary | ICD-10-CM | POA: Diagnosis not present

## 2015-10-24 NOTE — Progress Notes (Signed)
  Radiation Oncology         [phone] ________________________________  Name: Lydia Walsh MRN: [id number]  Date: 10/24/2015  DOB: 10-Jul-1955  CC: Dellia Nims, MD  Gordy Levan, MD  HDR BRACHYTHERAPY NOTE  DIAGNOSIS: Stage IIIC-1 grade 2 endometrioid endometrial cancer   Simple treatment device note: Patient had construction of her custom vaginal cylinder. She will be treated with a 3 cm diameter segmented cylinder. This conforms to her anatomy without undue discomfort.  Vaginal brachytherapy procedure node: The patient was brought to the [place] suite. Identity was confirmed. All relevant records and images related to the planned course of therapy were reviewed. The patient freely provided informed written consent to proceed with treatment after reviewing the details related to the planned course of therapy. The consent form was witnessed and verified by the simulation staff. Then, the patient was set-up in a stable reproducible supine position for radiation therapy. A pelvic exam was performed documenting the vaginal cuff to be intact. No pelvic masses The patient's custom vaginal cylinder was placed in the proximal vagina. This was affixed to the CT/MR stabilization plate to prevent slippage. Patient tolerated the placement well.  Verification simulation note:  A fiducial marker was placed within the vaginal cylinder. An AP and lateral film was then obtained through the pelvis area. This documented accurate position of the vaginal cylinder for treatment.  HDR BRACHYTHERAPY TREATMENT  The remote afterloading device was affixed to the vaginal cylinder by catheter. Patient then proceeded to undergo her third high-dose-rate treatment directed at the proximal vagina. The patient was prescribed a dose of 6 gray to be delivered to the mucosal surface. Treatment length was 3 cm. Patient was treated with one channel using seven dwell positions. Treatment time was 164.3 seconds. Iridium 192  was the high-dose-rate source for treatment. The patient tolerated the treatment well. After completion of her therapy, a radiation survey was performed documenting return of the iridium source into the Gamma[employer] safe.   PLAN: Follow up in one month. ________________________________  Blair Promise, PhD, MD   This document serves as a record of services personally performed by Gery Pray, MD. It was created on his behalf by Bethann Humble, a trained medical scribe. The creation of this record is based on the scribe's personal observations and the provider's statements to them. This document has been checked and approved by the attending provider.

## 2015-10-25 ENCOUNTER — Ambulatory Visit (INDEPENDENT_AMBULATORY_CARE_PROVIDER_SITE_OTHER): Payer: BLUE CROSS/BLUE SHIELD | Admitting: Cardiovascular Disease

## 2015-10-25 ENCOUNTER — Encounter: Payer: Self-pay | Admitting: Cardiovascular Disease

## 2015-10-25 VITALS — BP 137/82 | HR 97 | Ht 60.0 in | Wt 171.4 lb

## 2015-10-25 DIAGNOSIS — R002 Palpitations: Secondary | ICD-10-CM

## 2015-10-25 DIAGNOSIS — Z0000 Encounter for general adult medical examination without abnormal findings: Secondary | ICD-10-CM

## 2015-10-25 LAB — T4, FREE: FREE T4: 0.9 ng/dL (ref 0.8–1.8)

## 2015-10-25 LAB — TSH: TSH: 13.76 m[IU]/L — AB

## 2015-10-25 NOTE — Assessment & Plan Note (Signed)
History of dyslipidemia on statin therapy with recent lipid profile performed 04/04/15 revealing LDL of 122 and HDL 79

## 2015-10-25 NOTE — Progress Notes (Signed)
10/25/2015 Lydia Walsh   February 15, 1955  [id number]  Primary Physician Dellia Nims, MD Primary Cardiologist: Lorretta Harp MD Renae Gloss  HPI:  Lydia Walsh is a 60 year old moderately overweight divorced African American female mother of 3 children and is [place]. She was referred by her PCP for evaluation of palpitations. She's been evaluated for this in the past several years ago. She also has recent diagnosis of endometrial cancer in June of this year. She has no other cardiac risk factors. She does not drink caffeine. I'm going to get a 2-D echo, and a monitor and routine lab work and we'll see her back after that.   Current Outpatient Prescriptions  Medication Sig Dispense Refill  . allopurinol (ZYLOPRIM) 100 MG tablet TAKE ONE TABLET BY MOUTH ONCE DAILY 90 tablet 1  . amLODipine (NORVASC) 10 MG tablet TAKE ONE TABLET BY MOUTH ONCE DAILY 30 tablet 0  . dexamethasone (DECADRON) 4 MG tablet Take 5 tablets with food 12 hrs and 6 hrs before taxol Chemotherapy. 10 tablet 2  . diphenoxylate-atropine (LOMOTIL) 2.5-0.025 MG tablet Take 2 tablets by mouth 4 (four) times daily as needed for diarrhea or loose stools. 30 tablet 1  . docusate sodium (COLACE) 100 MG capsule Take 1 capsule (100 mg total) by mouth 2 (two) times daily. 30 capsule 0  . Ferrous Fumarate (HEMOCYTE - 106 MG FE) 324 (106 Fe) MG TABS tablet Take 1 tablet (106 mg of iron total) by mouth daily. On an empty stomach with OJ 30 tablet 4  . HYDROcodone-acetaminophen (NORCO/VICODIN) 5-325 MG tablet Take 1 tablet by mouth every 4 (four) hours as needed for severe pain. 10 tablet 0  . ibuprofen (ADVIL,MOTRIN) 200 MG tablet Take 200 mg by mouth every 6 (six) hours as needed for mild pain. Reported on 07/15/2015    . levothyroxine (SYNTHROID, LEVOTHROID) 112 MCG tablet Take 1 tablet (112 mcg total) by mouth daily before breakfast. 90 tablet 0  . lidocaine-prilocaine (EMLA) cream Apply to  port 1-2 hours before accessing with a needle. Cover with plastic wrap. 30 g 1  . loperamide (IMODIUM A-D) 2 MG tablet Take 1 tablet (2 mg total) by mouth 4 (four) times daily as needed for diarrhea or loose stools. 30 tablet 1  . LORazepam (ATIVAN) 0.5 MG tablet Place 1 tablet under the tongue or swallow every 6 hours as needed for nausea. Will make sleepy. 20 tablet 0  . omeprazole (PRILOSEC) 20 MG capsule Take 1 capsule (20 mg total) by mouth daily. 30 capsule 2  . ondansetron (ZOFRAN) 8 MG tablet Take 1 tablet (8 mg total) by mouth every 8 (eight) hours as needed for nausea. Will make drowsy. 30 tablet 1  . potassium chloride (K-DUR) 10 MEQ tablet Take 1 tablet (10 mEq total) by mouth daily. 14 tablet 0  . simvastatin (ZOCOR) 20 MG tablet Take 1 tablet (20 mg total) by mouth every evening. 30 tablet 11  . spironolactone (ALDACTONE) 25 MG tablet Take 25 mg by mouth daily.  3  . vitamin E (VITAMIN E) 400 UNIT capsule Take 400 Units by mouth daily.     Current Facility-Administered Medications  Medication Dose Route Frequency Provider Last Rate Last Dose  . sodium chloride 0.9 % injection 10 mL  10 mL Intravenous Once Gery Pray, MD        Allergies  Allergen Reactions  . Hctz [Hydrochlorothiazide] Rash    Social History  Social History  . Marital status: Widowed    Spouse name: N/A  . Number of children: 3  . Years of education: N/A   Occupational History  . security    Social History Main Topics  . Smoking status: Former Smoker    Years: 2.00    Types: Cigarettes    Quit date: 02/28/1994  . Smokeless tobacco: Never Used  . Alcohol use No  . Drug use: No  . Sexual activity: Not on file   Other Topics Concern  . Not on file   Social History Narrative   Lives with her son.  Perimenopausal.     Review of Systems: General: negative for chills, fever, night sweats or weight changes.  Cardiovascular: negative for chest pain, dyspnea on exertion, edema, orthopnea,  palpitations, paroxysmal nocturnal dyspnea or shortness of breath Dermatological: negative for rash Respiratory: negative for cough or wheezing Urologic: negative for hematuria Abdominal: negative for nausea, vomiting, diarrhea, bright red blood per rectum, melena, or hematemesis Neurologic: negative for visual changes, syncope, or dizziness All other systems reviewed and are otherwise negative except as noted above.    Blood pressure 137/82, pulse 97, height 5' (1.524 m), weight 171 lb 6.4 oz (77.7 kg), last menstrual period 07/16/2014, SpO2 100 %.  General appearance: alert and no distress Neck: no adenopathy, no carotid bruit, no JVD, supple, symmetrical, trachea midline and thyroid not enlarged, symmetric, no tenderness/mass/nodules Lungs: clear to auscultation bilaterally Heart: regular rate and rhythm, S1, S2 normal, no murmur, click, rub or gallop Extremities: extremities normal, atraumatic, no cyanosis or edema  EKG performed today  ASSESSMENT AND PLAN:   Palpitations Lydia Walsh is referred here by her primary care physician for fairly new onset palpitations which are now occurring daily. She's had workup for this several years ago. She has no symptoms of presyncope. She is does not drink caffeine beverages. I'm going to get a 2-D echo, 2 week event monitor and thyroid function tests. I'll see her back after that for further evaluation.  Dyslipidemia History of dyslipidemia on statin therapy with recent lipid profile performed 04/04/15 revealing LDL of 122 and HDL 79  Essential hypertension History of hypertension blood pressure 137/82. She is on amlodipine. Continue current meds at current dosing      Lorretta Harp MD [facility] 10/25/2015 12:18 PM

## 2015-10-25 NOTE — Patient Instructions (Signed)
Medication Instructions:  NO CHANGES.  Labwork: Your physician recommends that you return for lab work in: [place].   Testing/Procedures: Your physician has requested that you have an echocardiogram. Echocardiography is a painless test that uses sound waves to create images of your heart. It provides your doctor with information about the size and shape of your heart and how well your heart's chambers and valves are working. This procedure takes approximately one hour. There are no restrictions for this procedure.  Your physician has recommended that you wear an event monitor. Event monitors are medical devices that record the heart's electrical activity. Doctors most often [place] these monitors to diagnose arrhythmias. Arrhythmias are problems with the speed or rhythm of the heartbeat. The monitor is a small, portable device. You can wear one while you do your normal daily activities. This is usually used to diagnose what is causing palpitations/syncope (passing out). 2 WEEK EVENT.   Follow-Up: Your physician recommends that you schedule a follow-up appointment in: [place].   If you need a refill on your cardiac medications before your next appointment, please call your pharmacy.

## 2015-10-25 NOTE — Assessment & Plan Note (Signed)
History of hypertension blood pressure 137/82. She is on amlodipine. Continue current meds at current dosing

## 2015-10-25 NOTE — Assessment & Plan Note (Signed)
Lydia Walsh is referred here by her primary care physician for fairly new onset palpitations which are now occurring daily. She's had workup for this several years ago. She has no symptoms of presyncope. She is does not drink caffeine beverages. I'm going to get a 2-D echo, 2 week event monitor and thyroid function tests. I'll see her back after that for further evaluation.

## 2015-10-27 ENCOUNTER — Other Ambulatory Visit: Payer: Self-pay | Admitting: Oncology

## 2015-10-28 ENCOUNTER — Other Ambulatory Visit (HOSPITAL_BASED_OUTPATIENT_CLINIC_OR_DEPARTMENT_OTHER): Payer: BLUE CROSS/BLUE SHIELD

## 2015-10-28 ENCOUNTER — Encounter: Payer: Self-pay | Admitting: Internal Medicine

## 2015-10-28 ENCOUNTER — Other Ambulatory Visit: Payer: Self-pay | Admitting: Internal Medicine

## 2015-10-28 ENCOUNTER — Ambulatory Visit (HOSPITAL_BASED_OUTPATIENT_CLINIC_OR_DEPARTMENT_OTHER): Payer: BLUE CROSS/BLUE SHIELD | Admitting: Oncology

## 2015-10-28 ENCOUNTER — Encounter: Payer: Self-pay | Admitting: Oncology

## 2015-10-28 VITALS — BP 131/72 | HR 84 | Temp 98.1°F | Resp 18 | Ht 60.0 in | Wt 171.5 lb

## 2015-10-28 DIAGNOSIS — D701 Agranulocytosis secondary to cancer chemotherapy: Secondary | ICD-10-CM | POA: Diagnosis not present

## 2015-10-28 DIAGNOSIS — Z23 Encounter for immunization: Secondary | ICD-10-CM

## 2015-10-28 DIAGNOSIS — Z95828 Presence of other vascular implants and grafts: Secondary | ICD-10-CM

## 2015-10-28 DIAGNOSIS — C541 Malignant neoplasm of endometrium: Secondary | ICD-10-CM

## 2015-10-28 DIAGNOSIS — D500 Iron deficiency anemia secondary to blood loss (chronic): Secondary | ICD-10-CM

## 2015-10-28 DIAGNOSIS — Z6841 Body Mass Index (BMI) 40.0 and over, adult: Secondary | ICD-10-CM

## 2015-10-28 DIAGNOSIS — E039 Hypothyroidism, unspecified: Secondary | ICD-10-CM | POA: Diagnosis not present

## 2015-10-28 DIAGNOSIS — T451X5A Adverse effect of antineoplastic and immunosuppressive drugs, initial encounter: Secondary | ICD-10-CM

## 2015-10-28 DIAGNOSIS — I10 Essential (primary) hypertension: Secondary | ICD-10-CM

## 2015-10-28 LAB — COMPREHENSIVE METABOLIC PANEL
ALT: 25 U/L (ref 0–55)
AST: 21 U/L (ref 5–34)
Albumin: 3.6 g/dL (ref 3.5–5.0)
Alkaline Phosphatase: 105 U/L (ref 40–150)
Anion Gap: 13 mEq/L — ABNORMAL HIGH (ref 3–11)
BILIRUBIN TOTAL: 0.47 mg/dL (ref 0.20–1.20)
BUN: 17.5 mg/dL (ref 7.0–26.0)
CO2: 28 meq/L (ref 22–29)
Calcium: 9.7 mg/dL (ref 8.4–10.4)
Chloride: 104 mEq/L (ref 98–109)
Creatinine: 0.7 mg/dL (ref 0.6–1.1)
GLUCOSE: 114 mg/dL (ref 70–140)
Potassium: 3.6 mEq/L (ref 3.5–5.1)
SODIUM: 145 meq/L (ref 136–145)
TOTAL PROTEIN: 7.2 g/dL (ref 6.4–8.3)

## 2015-10-28 LAB — CBC WITH DIFFERENTIAL/PLATELET
BASO%: 1.5 % (ref 0.0–2.0)
Basophils Absolute: 0 10*3/uL (ref 0.0–0.1)
EOS%: 4.1 % (ref 0.0–7.0)
Eosinophils Absolute: 0.1 10*3/uL (ref 0.0–0.5)
HCT: 35.1 % (ref 34.8–46.6)
HGB: 11.7 g/dL (ref 11.6–15.9)
LYMPH%: 10.9 % — AB (ref 14.0–49.7)
MCH: 29.8 pg (ref 25.1–34.0)
MCHC: 33.2 g/dL (ref 31.5–36.0)
MCV: 89.6 fL (ref 79.5–101.0)
MONO#: 0.3 10*3/uL (ref 0.1–0.9)
MONO%: 11.1 % (ref 0.0–14.0)
NEUT%: 72.4 % (ref 38.4–76.8)
NEUTROS ABS: 1.7 10*3/uL (ref 1.5–6.5)
Platelets: 192 10*3/uL (ref 145–400)
RBC: 3.92 10*6/uL (ref 3.70–5.45)
RDW: 14.4 % (ref 11.2–14.5)
WBC: 2.4 10*3/uL — AB (ref 3.9–10.3)
lymph#: 0.3 10*3/uL — ABNORMAL LOW (ref 0.9–3.3)

## 2015-10-28 MED ORDER — INFLUENZA VAC SPLIT QUAD 0.5 ML IM SUSY
0.5000 mL | PREFILLED_SYRINGE | Freq: Once | INTRAMUSCULAR | Status: AC
  Administered 2015-10-28: 0.5 mL via INTRAMUSCULAR
  Filled 2015-10-28: qty 0.5

## 2015-10-28 MED ORDER — LEVOTHYROXINE SODIUM 125 MCG PO TABS: 125.0000 ug | ORAL_TABLET | Freq: Every day | ORAL | 3 refills | Status: DC

## 2015-10-28 NOTE — Progress Notes (Signed)
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 She had intial 3 cycles of carboplatin taxol followed by radiation, and resumed chemo with cycle 4 carbo taxol on 10-16-15. She had granix x3 doses days 2,3,4. She neede IVF after most recent chemotherapy, which was helpful .  Patient is feeling well today. She had aches after taxol and granix, mostly in legs, better with claritin and some hydrocodone. Radiation diarrhea has resolved, bowels moving regularly now. She has not had significant nausea or vomiting and denies any significant peripheral neuropathy. She has noticed some tachycardia or palpitations which have not been documented, but is for Holter monitor upcoming. She denies chest pain or any other cardiac symptoms now. She knows to stay well hydrated. She has not had fever, SOB, abdominal or pelvic pain, bleeding, symptoms of infection or problems with PAC. Sleeps poorly, bit worked third shift for years prior to recent retirement Prefers all lab draws from [facility] Remainder of 10 point Review of Systems negative.    ONCOLOGIC HISTORY  Patient had ~ 6 months of post menopausal bleeding, at times heavy, prior to reestablishing with Dr Richardean Chimera. [place] 02-12-15 showed slightly enlarged uterus with multiple intramural fibroids up to 3.5 cm, endometrial thickness 4 mm, polypoid lesion in cavity, ovaries grossly normal. She had hysteroscopic resection of endometrial polyp, with grade 2 endometrial adenocarcinoma. She was seen by gyn oncology on 03-29-15, with uterus large without other findings of concern. She had robotic laparoscopic hysterectomy BSO with bilateral sentinel node sampling and lysis of  adhesions by Dr Nelly Rout on 05-02-15. Pathology [phone]) grade 2 endometrial adenocarcinoma with 90% invasion of myometrium (1.4 cm out of 1.5 cm), no involvement cervix or adnexa, + focal LVSI, 2 benign right sentinel nodes and no left pelvic nodes in specimen; MMR normal. Surgery was uncomplicated, patient DC on post op day 1; she has recovered well from surgery, returning to work after 3 weeks. . She had CT AP 05-24-15 with mild left common iliac adenopathy up to 1.1 cm as well as 0.7 cm low aortocaval node, and a 1.6 x 1.6 cm renal mass posterior lower left kidney indeterminate. PET 06-12-15 had SUV 4.1 at the 10 mm left common iliac node, other nodes too small for PET evaluation; mildly hyperdense lesion lower pole left kidney 1.8 cm. She saw Dr Roselind Messier in consultation on 06-06-15. Recommendation from surgical pathology and imaging is for chemotherapy and radiation in sandwich fashion.  First carboplatin taxol given 06-26-15, neutropenic with ANC 0.4 on day 13 cycle 1, gCSF added. She had cycle 3 chemotherapy on 08-07-15, followed by pelvic radiation and HDR thru 10-24-15. She resumed chemotherapy with cycle 4 carbo taxol on 10-16-15.    Objective:  Vital signs in last 24 hours: Weight down 4 lbs to 171, 137/82, 97 regular, 100%. Afebrile  Alert, oriented and appropriate. Ambulatory without difficulty, easily able to get on and off exam table Alopecia  HEENT:PERRL, sclerae not icteric. Oral mucosa moist without lesions, posterior pharynx clear.  Neck supple. No JVD.  Lymphatics:no cervical,supraclavicular, axillary or inguinal adenopathy Resp: clear to auscultation bilaterally and normal percussion bilaterally Cardio: regular rate and rhythm. No gallop. GI: soft, nontender, not distended, no mass or organomegaly. Normally active bowel sounds. Surgical  incision not remarkable. Musculoskeletal/ Extremities: without pitting edema, cords, tenderness Neuro: no significant peripheral neuropathy.  Otherwise nonfocal Skin without rash, ecchymosis, petechiae Portacath-without erythema or tenderness  Lab Results:  Results for orders placed or performed in visit on 10/28/15  CBC with Differential  Result Value Ref Range   WBC 2.4 (L) 3.9 - 10.3 10e3/uL   NEUT# 1.7 1.5 - 6.5 10e3/uL   HGB 11.7 11.6 - 15.9 g/dL   HCT 35.1 34.8 - 46.6 %   Platelets 192 145 - 400 10e3/uL   MCV 89.6 79.5 - 101.0 fL   MCH 29.8 25.1 - 34.0 pg   MCHC 33.2 31.5 - 36.0 g/dL   RBC 3.92 3.70 - 5.45 10e6/uL   RDW 14.4 11.2 - 14.5 %   lymph# 0.3 (L) 0.9 - 3.3 10e3/uL   MONO# 0.3 0.1 - 0.9 10e3/uL   Eosinophils Absolute 0.1 0.0 - 0.5 10e3/uL   Basophils Absolute 0.0 0.0 - 0.1 10e3/uL   NEUT% 72.4 38.4 - 76.8 %   LYMPH% 10.9 (L) 14.0 - 49.7 %   MONO% 11.1 0.0 - 14.0 %   EOS% 4.1 0.0 - 7.0 %   BASO% 1.5 0.0 - 2.0 %   CMET normal including K 3.6  Studies/Results:  No results found.  Echocardiogram by Dr Gwenlyn Found scheduled for 11-08-15, which will not be easy for her due to chemo 10-25; I have spoken with   Medications: I have reviewed the patient's current medications. OK to try OTC melatonin for sleep Flu vaccine 10-28-15 AUC = 5 for cycle 5 carbo  DISCUSSION Patient feels that she is tolerating chemotherapy adequately to continue as planned. Echocardiogram is scheduled by cardiology just after next chemo, which will not be easy for her to manage; I have called that office to request this be moved by a few days After discussion, she agrees to flu vaccine today  Assessment/Plan:  1.IIIC1 grade 2 endometrioid endometrial carcinoma: post robotic assisted laparoscopic hysterectomy BSO and right pelvic sentinel nodes, with invasion 90% of myometrium and CT/PET evidence of left common iliac node involvement. Plan total 6 cycles of carboplatin taxol chemotherapy in sandwich fashion with RT. Cycle 1 given 06-26-15, chemo neutropenia day 13, granix added.  Cycle 5 chemo due 11-06-15 as long as ANC >=1.5 and plt  >=100k.   She needs gCSF and claritin with subsequent chemo treatments due to documented chemo neutropenia and taxol aches.  2.Iron deficiency related to 6+ months of vaginal bleeding: continue oral iron as ferrous fumarate 3.questionable mass left kidney and right nephrolithiasis: UA negative for blood 07-08-15. May need urology input for left kidney after present treatment completes 4.hypothyroidism on medication, monitored by primary care 5.obesity, BMI 35. Exercise limited by chronic problems knees 6. Cholelithiasis, not presently symptomatic 7.diverticulitis and hemorrhoids by colonoscopy 2007. She is due repeat colonoscopy, likely best to wait on this also until present treatment completed 8.minimal past tobacco 9.Complaints of intermittent tachycardia : cardiology evaluation in process, Holter planned. HTN, elevated lipids. Good EF 2012 10.EMR mention of OSA 11.PAC placed by IR   All questions answered and patient is in agreement with plans, knows to call if concerns prior to next scheduled visit. Chemo and granix orders confirmed. Time spent 25 min including >50% counseling and coordination of care. Route PCP   Evlyn Clines, MD   10/28/2015, 10:30 AM

## 2015-10-28 NOTE — Progress Notes (Signed)
I am covering Dr. Brandt Loosen inbox while he is on vacation.  Ms. Skotnicki was seen by Dr. Gwenlyn Found to assess for a sensation of palpitations.  He obtained a TSH and free T4.  These resulted with an elevated TSH of 13.76 and a normal free T4 of 0.9.  I called Ms. Going to ask if she has been compliant with her synthroid 112 ug daily and she states that last week she may have missed 2 doses, but prior to that she has been compliant.  I stressed the importance of letting me know about her compliance given the palpitations and she assures me she has been.  We will therefore increase the synthroid to 125 mcg daily and have Dr. [name] reassess the TSH in approximately 4 weeks.  Ms. Anhalt is on board with this plan.  The decision to increase the synthroid was based on the fact that the TSH was > 10, so subclinical hypothyroidism is a harder diagnosis to justify in this setting, but a slow titration of the synthroid is appropriate in the setting of palpitations.

## 2015-10-29 ENCOUNTER — Ambulatory Visit (INDEPENDENT_AMBULATORY_CARE_PROVIDER_SITE_OTHER): Payer: BLUE CROSS/BLUE SHIELD

## 2015-10-29 DIAGNOSIS — R002 Palpitations: Secondary | ICD-10-CM | POA: Diagnosis not present

## 2015-10-31 ENCOUNTER — Telehealth: Payer: Self-pay | Admitting: Internal Medicine

## 2015-10-31 ENCOUNTER — Telehealth: Payer: Self-pay

## 2015-10-31 DIAGNOSIS — C541 Malignant neoplasm of endometrium: Secondary | ICD-10-CM

## 2015-10-31 MED ORDER — DEXAMETHASONE 4 MG PO TABS: ORAL_TABLET | ORAL | 2 refills | Status: DC

## 2015-10-31 NOTE — Telephone Encounter (Signed)
Pt would like a call back about her Thyroid Levels.

## 2015-10-31 NOTE — Telephone Encounter (Signed)
Pt lvm asking for a call back for questions. Called pt back. She was getting conflicting information about her thyroid levels and she was looking on line about thyroid. She got an appt with her PCP for tomorrow.  She requested dexamethasone refill for chemo next week -done.

## 2015-10-31 NOTE — Telephone Encounter (Signed)
Called pt, she has lots of questions about her thyroid, she states she has looked it up and maybe that's why her heart is "a flutter" appt made for 10/20 acc at 1315

## 2015-11-01 ENCOUNTER — Ambulatory Visit (INDEPENDENT_AMBULATORY_CARE_PROVIDER_SITE_OTHER): Payer: BLUE CROSS/BLUE SHIELD | Admitting: Internal Medicine

## 2015-11-01 VITALS — BP 154/74 | HR 94 | Temp 98.2°F | Ht 60.0 in | Wt 173.9 lb

## 2015-11-01 DIAGNOSIS — Z79899 Other long term (current) drug therapy: Secondary | ICD-10-CM

## 2015-11-01 DIAGNOSIS — R002 Palpitations: Secondary | ICD-10-CM | POA: Diagnosis not present

## 2015-11-01 DIAGNOSIS — E039 Hypothyroidism, unspecified: Secondary | ICD-10-CM

## 2015-11-01 DIAGNOSIS — Z8542 Personal history of malignant neoplasm of other parts of uterus: Secondary | ICD-10-CM

## 2015-11-01 DIAGNOSIS — Z90710 Acquired absence of both cervix and uterus: Secondary | ICD-10-CM

## 2015-11-01 DIAGNOSIS — Z87891 Personal history of nicotine dependence: Secondary | ICD-10-CM

## 2015-11-01 NOTE — Patient Instructions (Addendum)
Today we talked about your thyroid levels and palpitations (fluttering feeling in you chest).  The test for you thyroid called the TSH was HIGH, which means your thyroid levels are LOW.  Because of this, the dose of your thyroid medicine, Synthroid, was increased.  Please pick up your new prescription for 125 mcg of Synthroid (levothyroxine) per day, and take that instead of your old pills.  Keep wearing the heart monitor and get the echocardiogram done on Monday.  This tests are important to figure out what is causing the fluttering feeling.  We will see you back in clinic on 11/20 to check on your thyroid again.     Hypothyroidism Hypothyroidism is a disorder of the thyroid. The thyroid is a large gland that is located in the lower front of the neck. The thyroid releases hormones that control how the body works. With hypothyroidism, the thyroid does not make enough of these hormones. CAUSES Causes of hypothyroidism may include:  Viral infections.  Pregnancy.  Your own defense system (immune system) attacking your thyroid.  Certain medicines.  Birth defects.  Past radiation treatments to your head or neck.  Past treatment with radioactive iodine.  Past surgical removal of part or all of your thyroid.  Problems with the gland that is located in the center of your brain (pituitary). SIGNS AND SYMPTOMS Signs and symptoms of hypothyroidism may include:  Feeling as though you have no energy (lethargy).  Inability to tolerate cold.  Weight gain that is not explained by a change in diet or exercise habits.  Dry skin.  Coarse hair.  Menstrual irregularity.  Slowing of thought processes.  Constipation.  Sadness or depression. DIAGNOSIS  Your health care provider may diagnose hypothyroidism with blood tests and ultrasound tests. TREATMENT Hypothyroidism is treated with medicine that replaces the hormones that your body does not make. After you begin treatment, it may  take several weeks for symptoms to go away. HOME CARE INSTRUCTIONS   Take medicines only as directed by your health care provider.  If you start taking any new medicines, tell your health care provider.  Keep all follow-up visits as directed by your health care provider. This is important. As your condition improves, your dosage needs may change. You will need to have blood tests regularly so that your health care provider can watch your condition. SEEK MEDICAL CARE IF:  Your symptoms do not get better with treatment.  You are taking thyroid replacement medicine and:  You sweat excessively.  You have tremors.  You feel anxious.  You lose weight rapidly.  You cannot tolerate heat.  You have emotional swings.  You have diarrhea.  You feel weak. SEEK IMMEDIATE MEDICAL CARE IF:   You develop chest pain.  You develop an irregular heartbeat.  You develop a rapid heartbeat.   This information is not intended to replace advice given to you by your health care provider. Make sure you discuss any questions you have with your health care provider.   Document Released: 12/29/2004 Document Revised: 01/19/2014 Document Reviewed: 05/16/2013 Elsevier Interactive Patient Education [employer].

## 2015-11-01 NOTE — Assessment & Plan Note (Signed)
Continuing daily brief episodes of palpitations with no other associated symptoms.  She has already captured several events while wearing the event monitor. -Continue wearing cardiac monitor as prescribed -Go to echo on Monday -F/u with Dr Gwenlyn Found on 11/17

## 2015-11-01 NOTE — Assessment & Plan Note (Signed)
TSH elevated with low-normal Free T4, consistent with subclinical hypothyroidism.  She reports compliance with her synthroid, but has not yet picked up her new synthroid prescription and has been continuing with 112 mcg daily  -levothyroxine 125 mcg daily  -repeat TSH at next visit

## 2015-11-01 NOTE — Progress Notes (Signed)
CC: thyroid levels and palpitations  HPI:  [name] is a 60 y.o. woman with history of HTN, hypothyroidism, endometrial cancer (s/p total hysterectomy BSO and radiation, on carboplatin/taxol chemo) who presents for follow-up of hypothyroidism and palpitations.  For the past 2 months, she has been feeling fluttering in her chest every day, several episodes lasting only a few seconds each.  Not associated with chest pain, dyspnea, or nausea.  Unpredictable, not reliably exertional.   She was last seen in our clinic on 9/18 with 2 weeks of palpitations without CP or SOB.  She has had palpitations several years ago, and has no history of cardiac disease.  She had a normal EKG and was referred to cardiology.   She was seen by cardiologist Dr Gwenlyn Found on 10/13, and given cardaic event monitor and scheduled for echocardigram on 10/23. Her TSH was found to be elevated and Free T4 low-normal on 10/13, and synthroid dose increased from 112 mcg to 125 mcg daily on 10/16.  She is still taking 112 mcg since she has not picked up new prescription.  She received conflicting information about whether her thyroid levels were low or high, and wanted further explanation about whether her thyroid could explain her palpitations.  Has been wearing Holter, took it off this morning when she showered and was not intending to wear it during chemotherapy. Echo scheduled on Monday.  Will see Dr Gwenlyn Found again in 4 weeks.   Past Medical History:  Diagnosis Date  . Cancer [facility])    endometrial cancer  . Endometrial polyp   . GERD (gastroesophageal reflux disease)   . Heart murmur   . History of uterine fibroid   . Hyperlipidemia   . Hypertension   . Hypothyroidism   . OSA (obstructive sleep apnea)    moderate OSA per study 06-21-2005--  per pt does need cpap anymore never purchased it  . Osteoarthritis   . PMB (postmenopausal bleeding)   . Wears dentures    upper  . Wears glasses     Review of Systems:     Review of Systems  Constitutional: Negative for chills and fever.  Respiratory: Negative for cough, shortness of breath and wheezing.   Cardiovascular: Positive for palpitations. Negative for chest pain, orthopnea, leg swelling and PND.     Physical Exam:  Vitals:   11/01/15 1319  BP: (!) 154/74  Pulse: 94  Temp: 98.2 F (36.8 C)  TempSrc: Oral  SpO2: 100%  Weight: 173 lb 14.4 oz (78.9 kg)  Height: 5' (1.524 m)   Physical Exam  Constitutional: She is oriented to person, place, and time. She appears well-developed and well-nourished. No distress.  Neck: No thyromegaly present.  Cardiovascular: Normal rate, regular rhythm and normal heart sounds.   No murmur heard. Pulmonary/Chest: Effort normal and breath sounds normal. She has no wheezes.  Musculoskeletal: She exhibits no edema.  Neurological: She is alert and oriented to person, place, and time.  Skin: Skin is warm and dry.  Psychiatric: She has a normal mood and affect. Her behavior is normal.    Component     Latest Ref Rng & Units 10/25/2015  TSH     mIU/L 13.76 (H)   Component     Latest Ref Rng & Units 10/25/2015  T4,Free(Direct)     0.8 - 1.8 ng/dL 0.9   CBC Latest Ref Rng & Units 10/28/2015 10/16/2015 09/23/2015  WBC 3.9 - 10.3 10e3/uL 2.4(L) 6.1 4.4  Hemoglobin 11.6 - 15.9  g/dL 11.7 13.7 12.2  Hematocrit 34.8 - 46.6 % 35.1 40.0 37.7  Platelets 145 - 400 10e3/uL 192 202 141(L)   CMP Latest Ref Rng & Units 10/28/2015 10/16/2015 09/23/2015  Glucose 70 - 140 mg/dl 114 175(H) 89  BUN 7.0 - 26.0 mg/dL 17.5 12.1 15.6  Creatinine 0.6 - 1.1 mg/dL 0.7 0.7 0.7  Sodium 136 - 145 mEq/L 145 140 140  Potassium 3.5 - 5.1 mEq/L 3.6 3.6 3.4(L)  Chloride 101 - 111 mmol/L - - -  CO2 22 - 29 mEq/L 28 24 24   Calcium 8.4 - 10.4 mg/dL 9.7 10.4 9.5  Total Protein 6.4 - 8.3 g/dL 7.2 8.1 7.2  Total Bilirubin 0.20 - 1.20 mg/dL 0.47 0.80 0.54  Alkaline Phos 40 - 150 U/L 105 116 85  AST 5 - 34 U/L 21 28 21   ALT 0 - 55 U/L 25 34  24     Assessment & Plan:   See Encounters Tab for problem based charting.  Patient seen with Dr. Angelia Mould

## 2015-11-03 ENCOUNTER — Other Ambulatory Visit: Payer: Self-pay | Admitting: Student in an Organized Health Care Education/Training Program

## 2015-11-03 ENCOUNTER — Other Ambulatory Visit: Payer: Self-pay | Admitting: Internal Medicine

## 2015-11-03 DIAGNOSIS — I10 Essential (primary) hypertension: Secondary | ICD-10-CM

## 2015-11-04 ENCOUNTER — Ambulatory Visit (HOSPITAL_COMMUNITY): Payer: BLUE CROSS/BLUE SHIELD | Attending: Cardiovascular Disease

## 2015-11-04 ENCOUNTER — Other Ambulatory Visit: Payer: Self-pay

## 2015-11-04 DIAGNOSIS — E039 Hypothyroidism, unspecified: Secondary | ICD-10-CM | POA: Insufficient documentation

## 2015-11-04 DIAGNOSIS — R002 Palpitations: Secondary | ICD-10-CM | POA: Diagnosis not present

## 2015-11-04 DIAGNOSIS — I10 Essential (primary) hypertension: Secondary | ICD-10-CM | POA: Diagnosis not present

## 2015-11-04 DIAGNOSIS — G4733 Obstructive sleep apnea (adult) (pediatric): Secondary | ICD-10-CM | POA: Diagnosis not present

## 2015-11-04 DIAGNOSIS — E785 Hyperlipidemia, unspecified: Secondary | ICD-10-CM | POA: Diagnosis not present

## 2015-11-04 NOTE — Progress Notes (Signed)
Internal Medicine Clinic Attending  I saw and evaluated the patient.  I personally confirmed the key portions of the history and exam documented by Dr. O'Sullivan and I reviewed pertinent patient test results.  The assessment, diagnosis, and plan were formulated together and I agree with the documentation in the resident's note.   

## 2015-11-05 ENCOUNTER — Other Ambulatory Visit: Payer: Self-pay | Admitting: Student in an Organized Health Care Education/Training Program

## 2015-11-05 ENCOUNTER — Encounter: Payer: Self-pay | Admitting: Pharmacist

## 2015-11-06 ENCOUNTER — Other Ambulatory Visit (HOSPITAL_BASED_OUTPATIENT_CLINIC_OR_DEPARTMENT_OTHER): Payer: BLUE CROSS/BLUE SHIELD

## 2015-11-06 ENCOUNTER — Ambulatory Visit (HOSPITAL_BASED_OUTPATIENT_CLINIC_OR_DEPARTMENT_OTHER): Payer: BLUE CROSS/BLUE SHIELD

## 2015-11-06 VITALS — BP 136/83 | HR 115 | Temp 98.2°F

## 2015-11-06 DIAGNOSIS — Z5111 Encounter for antineoplastic chemotherapy: Secondary | ICD-10-CM | POA: Diagnosis not present

## 2015-11-06 DIAGNOSIS — C541 Malignant neoplasm of endometrium: Secondary | ICD-10-CM

## 2015-11-06 LAB — CBC WITH DIFFERENTIAL/PLATELET
BASO%: 0 % (ref 0.0–2.0)
Basophils Absolute: 0 10*3/uL (ref 0.0–0.1)
EOS%: 0 % (ref 0.0–7.0)
Eosinophils Absolute: 0 10*3/uL (ref 0.0–0.5)
HEMATOCRIT: 34.4 % — AB (ref 34.8–46.6)
HEMOGLOBIN: 11.9 g/dL (ref 11.6–15.9)
LYMPH#: 0.4 10*3/uL — AB (ref 0.9–3.3)
LYMPH%: 7.6 % — ABNORMAL LOW (ref 14.0–49.7)
MCH: 30 pg (ref 25.1–34.0)
MCHC: 34.6 g/dL (ref 31.5–36.0)
MCV: 86.6 fL (ref 79.5–101.0)
MONO#: 0.1 10*3/uL (ref 0.1–0.9)
MONO%: 1.6 % (ref 0.0–14.0)
NEUT%: 90.8 % — AB (ref 38.4–76.8)
NEUTROS ABS: 5.2 10*3/uL (ref 1.5–6.5)
Platelets: 195 10*3/uL (ref 145–400)
RBC: 3.97 10*6/uL (ref 3.70–5.45)
RDW: 14.1 % (ref 11.2–14.5)
WBC: 5.8 10*3/uL (ref 3.9–10.3)
nRBC: 0 % (ref 0–0)

## 2015-11-06 LAB — COMPREHENSIVE METABOLIC PANEL
ALBUMIN: 3.9 g/dL (ref 3.5–5.0)
ALK PHOS: 136 U/L (ref 40–150)
ALT: 38 U/L (ref 0–55)
AST: 28 U/L (ref 5–34)
Anion Gap: 12 mEq/L — ABNORMAL HIGH (ref 3–11)
BUN: 18.6 mg/dL (ref 7.0–26.0)
CALCIUM: 10.3 mg/dL (ref 8.4–10.4)
CHLORIDE: 106 meq/L (ref 98–109)
CO2: 22 mEq/L (ref 22–29)
CREATININE: 0.7 mg/dL (ref 0.6–1.1)
EGFR: >90 mL/min/{1.73_m2} (ref >90)
GLUCOSE: 190 mg/dL — AB (ref 70–140)
POTASSIUM: 3.3 meq/L — AB (ref 3.5–5.1)
SODIUM: 140 meq/L (ref 136–145)
Total Bilirubin: 0.41 mg/dL (ref 0.20–1.20)
Total Protein: 8 g/dL (ref 6.4–8.3)

## 2015-11-06 MED ORDER — ONDANSETRON HCL 4 MG/2ML IJ SOLN: 8.0000 mg | Freq: Once | INTRAMUSCULAR | Status: DC

## 2015-11-06 MED ORDER — LORAZEPAM 2 MG/ML IJ SOLN
0.5000 mg | Freq: Once | INTRAMUSCULAR | Status: AC | PRN
  Administered 2015-11-06: 0.5 mg via INTRAVENOUS

## 2015-11-06 MED ORDER — DIPHENHYDRAMINE HCL 50 MG/ML IJ SOLN
25.0000 mg | Freq: Once | INTRAMUSCULAR | Status: AC
  Administered 2015-11-06: 25 mg via INTRAVENOUS

## 2015-11-06 MED ORDER — LORAZEPAM 2 MG/ML IJ SOLN
INTRAMUSCULAR | Status: AC
  Filled 2015-11-06: qty 1

## 2015-11-06 MED ORDER — FAMOTIDINE IN NACL 20-0.9 MG/50ML-% IV SOLN
INTRAVENOUS | Status: AC
  Filled 2015-11-06: qty 50

## 2015-11-06 MED ORDER — SODIUM CHLORIDE 0.9% FLUSH
10.0000 mL | INTRAVENOUS | Status: DC | PRN
  Administered 2015-11-06: 10 mL
  Filled 2015-11-06: qty 10

## 2015-11-06 MED ORDER — POTASSIUM CHLORIDE CRYS ER 10 MEQ PO TBCR: 10.0000 meq | EXTENDED_RELEASE_TABLET | Freq: Once | ORAL | 0 refills | Status: DC

## 2015-11-06 MED ORDER — ONDANSETRON HCL 8 MG PO TABS
8.0000 mg | ORAL_TABLET | Freq: Once | ORAL | Status: AC
  Administered 2015-11-06: 8 mg via ORAL

## 2015-11-06 MED ORDER — HEPARIN SOD (PORK) LOCK FLUSH 100 UNIT/ML IV SOLN
500.0000 [IU] | Freq: Once | INTRAVENOUS | Status: AC | PRN
Start: 2015-11-06 — End: 2015-11-06
  Administered 2015-11-06: 500 [IU]
  Filled 2015-11-06: qty 5

## 2015-11-06 MED ORDER — ONDANSETRON HCL 8 MG PO TABS
ORAL_TABLET | ORAL | Status: AC
  Filled 2015-11-06: qty 1

## 2015-11-06 MED ORDER — FAMOTIDINE IN NACL 20-0.9 MG/50ML-% IV SOLN
20.0000 mg | Freq: Once | INTRAVENOUS | Status: AC
  Administered 2015-11-06: 20 mg via INTRAVENOUS

## 2015-11-06 MED ORDER — SODIUM CHLORIDE 0.9 % IV SOLN
598.0000 mg | Freq: Once | INTRAVENOUS | Status: AC
  Administered 2015-11-06: 600 mg via INTRAVENOUS
  Filled 2015-11-06: qty 60

## 2015-11-06 MED ORDER — PACLITAXEL CHEMO INJECTION 300 MG/50ML
175.0000 mg/m2 | Freq: Once | INTRAVENOUS | Status: AC
  Administered 2015-11-06: 324 mg via INTRAVENOUS
  Filled 2015-11-06: qty 54

## 2015-11-06 MED ORDER — DIPHENHYDRAMINE HCL 50 MG/ML IJ SOLN
INTRAMUSCULAR | Status: AC
  Filled 2015-11-06: qty 1

## 2015-11-06 MED ORDER — SODIUM CHLORIDE 0.9 % IV SOLN
Freq: Once | INTRAVENOUS | Status: AC
  Administered 2015-11-06: 11:00:00 via INTRAVENOUS
  Filled 2015-11-06: qty 5

## 2015-11-06 MED ORDER — SODIUM CHLORIDE 0.9 % IV SOLN
Freq: Once | INTRAVENOUS | Status: AC
  Administered 2015-11-06: 11:00:00 via INTRAVENOUS

## 2015-11-06 NOTE — Patient Instructions (Signed)
[facility] Discharge Instructions for Patients Receiving Chemotherapy  Today you received the following chemotherapy agents Taxol and Carboplatin. To help prevent nausea and vomiting after your treatment, we encourage you to take your nausea medication as directed.  If you develop nausea and vomiting that is not controlled by your nausea medication, call the clinic.   BELOW ARE SYMPTOMS THAT SHOULD BE REPORTED IMMEDIATELY:  *FEVER GREATER THAN 100.5 F  *CHILLS WITH OR WITHOUT FEVER  NAUSEA AND VOMITING THAT IS NOT CONTROLLED WITH YOUR NAUSEA MEDICATION  *UNUSUAL SHORTNESS OF BREATH  *UNUSUAL BRUISING OR BLEEDING  TENDERNESS IN MOUTH AND THROAT WITH OR WITHOUT PRESENCE OF ULCERS  *URINARY PROBLEMS  *BOWEL PROBLEMS  UNUSUAL RASH Items with * indicate a potential emergency and should be followed up as soon as possible.  Feel free to call the clinic you have any questions or concerns. The clinic phone number is [phone].  Please show the CHEMO ALERT CARD at check-in to the Emergency Department and triage nurse.    

## 2015-11-06 NOTE — Progress Notes (Signed)
OK to treat with HR today 115- pt feels well and this is not a new occurrence. Per MD Magrinat Marko Plume not here). Also made MD aware of Potassium and new order received.

## 2015-11-07 ENCOUNTER — Other Ambulatory Visit: Payer: Self-pay

## 2015-11-07 ENCOUNTER — Ambulatory Visit (HOSPITAL_BASED_OUTPATIENT_CLINIC_OR_DEPARTMENT_OTHER): Payer: BLUE CROSS/BLUE SHIELD

## 2015-11-07 VITALS — BP 137/68 | HR 99 | Temp 98.2°F | Resp 18

## 2015-11-07 DIAGNOSIS — Z5189 Encounter for other specified aftercare: Secondary | ICD-10-CM | POA: Diagnosis not present

## 2015-11-07 DIAGNOSIS — C541 Malignant neoplasm of endometrium: Secondary | ICD-10-CM

## 2015-11-07 MED ORDER — TBO-FILGRASTIM 480 MCG/0.8ML ~~LOC~~ SOSY
480.0000 ug | PREFILLED_SYRINGE | Freq: Once | SUBCUTANEOUS | Status: AC
Start: 2015-11-07 — End: 2015-11-07
  Administered 2015-11-07: 480 ug via SUBCUTANEOUS
  Filled 2015-11-07: qty 0.8

## 2015-11-07 NOTE — Patient Instructions (Signed)

## 2015-11-08 ENCOUNTER — Other Ambulatory Visit (HOSPITAL_COMMUNITY): Payer: BLUE CROSS/BLUE SHIELD

## 2015-11-08 ENCOUNTER — Ambulatory Visit (HOSPITAL_BASED_OUTPATIENT_CLINIC_OR_DEPARTMENT_OTHER): Payer: BLUE CROSS/BLUE SHIELD

## 2015-11-08 ENCOUNTER — Other Ambulatory Visit: Payer: Self-pay | Admitting: Oncology

## 2015-11-08 ENCOUNTER — Ambulatory Visit: Payer: BLUE CROSS/BLUE SHIELD

## 2015-11-08 ENCOUNTER — Other Ambulatory Visit: Payer: Self-pay | Admitting: *Deleted

## 2015-11-08 DIAGNOSIS — Z5189 Encounter for other specified aftercare: Secondary | ICD-10-CM

## 2015-11-08 DIAGNOSIS — C541 Malignant neoplasm of endometrium: Secondary | ICD-10-CM

## 2015-11-08 MED ORDER — SODIUM CHLORIDE 0.9% FLUSH
10.0000 mL | INTRAVENOUS | Status: DC | PRN
  Administered 2015-11-08: 10 mL via INTRAVENOUS
  Filled 2015-11-08: qty 10

## 2015-11-08 MED ORDER — HEPARIN SOD (PORK) LOCK FLUSH 100 UNIT/ML IV SOLN
500.0000 [IU] | Freq: Once | INTRAVENOUS | Status: AC
  Administered 2015-11-08: 500 [IU] via INTRAVENOUS
  Filled 2015-11-08: qty 5

## 2015-11-08 MED ORDER — SODIUM CHLORIDE 0.9 % IV SOLN
INTRAVENOUS | Status: DC
  Administered 2015-11-08: 08:00:00 via INTRAVENOUS

## 2015-11-08 MED ORDER — TBO-FILGRASTIM 480 MCG/0.8ML ~~LOC~~ SOSY
480.0000 ug | PREFILLED_SYRINGE | Freq: Once | SUBCUTANEOUS | Status: AC
  Administered 2015-11-08: 480 ug via SUBCUTANEOUS
  Filled 2015-11-08: qty 0.8

## 2015-11-08 NOTE — Patient Instructions (Addendum)
Dehydration, Adult Dehydration is a condition in which you do not have enough fluid or water in your body. It happens when you take in less fluid than you lose. Vital organs such as the kidneys, brain, and heart cannot function without a proper amount of fluids. Any loss of fluids from the body can cause dehydration.  Dehydration can range from mild to severe. This condition should be treated right away to help prevent it from becoming severe. CAUSES  This condition may be caused by:  Vomiting.  Diarrhea.  Excessive sweating, such as when exercising in hot or humid weather.  Not drinking enough fluid during strenuous exercise or during an illness.  Excessive urine output.  Fever.  Certain medicines. RISK FACTORS This condition is more likely to develop in:  People who are taking certain medicines that cause the body to lose excess fluid (diuretics).   People who have a chronic illness, such as diabetes, that may increase urination.  Older adults.   People who live at high altitudes.   People who participate in endurance sports.  SYMPTOMS  Mild Dehydration  Thirst.  Dry lips.  Slightly dry mouth.  Dry, warm skin. Moderate Dehydration  Very dry mouth.   Muscle cramps.   Dark urine and decreased urine production.   Decreased tear production.   Headache.   Light-headedness, especially when you stand up from a sitting position.  Severe Dehydration  Changes in skin.   Cold and clammy skin.   Skin does not spring back quickly when lightly pinched and released.   Changes in body fluids.   Extreme thirst.   No tears.   Not able to sweat when body temperature is high, such as in hot weather.   Minimal urine production.   Changes in vital signs.   Rapid, weak pulse (more than 100 beats per minute when you are sitting still).   Rapid breathing.   Low blood pressure.   Other changes.   Sunken eyes.   Cold hands and feet.    Confusion.  Lethargy and difficulty being awakened.  Fainting (syncope).   Short-term weight loss.   Unconsciousness. DIAGNOSIS  This condition may be diagnosed based on your symptoms. You may also have tests to determine how severe your dehydration is. These tests may include:   Urine tests.   Blood tests.  TREATMENT  Treatment for this condition depends on the severity. Mild or moderate dehydration can often be treated at home. Treatment should be started right away. Do not wait until dehydration becomes severe. Severe dehydration needs to be treated at the hospital. Treatment for Mild Dehydration  Drinking plenty of water to replace the fluid you have lost.   Replacing minerals in your blood (electrolytes) that you may have lost.  Treatment for Moderate Dehydration  Consuming oral rehydration solution (ORS). Treatment for Severe Dehydration  Receiving fluid through an IV tube.   Receiving electrolyte solution through a feeding tube that is passed through your nose and into your stomach (nasogastric tube or NG tube).  Correcting any abnormalities in electrolytes. HOME CARE INSTRUCTIONS   Drink enough fluid to keep your urine clear or pale yellow.   Drink water or fluid slowly by taking small sips. You can also try sucking on ice cubes.  Have food or beverages that contain electrolytes. Examples include bananas and sports drinks.  Take over-the-counter and prescription medicines only as told by your health care provider.   Prepare ORS according to the manufacturer's instructions. Take sips   of ORS every 5 minutes until your urine returns to normal.  If you have vomiting or diarrhea, continue to try to drink water, ORS, or both.   If you have diarrhea, avoid:   Beverages that contain caffeine.   Fruit juice.   Milk.   Carbonated soft drinks.  Do not take salt tablets. This can lead to the condition of having too much sodium in your body  (hypernatremia).  SEEK MEDICAL CARE IF:  You cannot eat or drink without vomiting.  You have had moderate diarrhea during a period of more than 24 hours.  You have a fever. SEEK IMMEDIATE MEDICAL CARE IF:   You have extreme thirst.  You have severe diarrhea.  You have not urinated in 6-8 hours, or you have urinated only a small amount of very dark urine.  You have shriveled skin.  You are dizzy, confused, or both.   This information is not intended to replace advice given to you by your health care provider. Make sure you discuss any questions you have with your health care provider.   Document Released: 12/29/2004 Document Revised: 09/19/2014 Document Reviewed: 05/16/2014 Elsevier Interactive Patient Education 2016 Elsevier Inc.   Tbo-Filgrastim injection What is this medicine? TBO-FILGRASTIM (T B O fil GRA stim) is a granulocyte colony-stimulating factor that stimulates the growth of neutrophils, a type of white blood cell important in the body's fight against infection. It is used to reduce the incidence of fever and infection in patients with certain types of cancer who are receiving chemotherapy that affects the bone marrow. This medicine may be used for other purposes; ask your health care provider or pharmacist if you have questions. What should I tell my health care provider before I take this medicine? They need to know if you have any of these conditions: -ongoing radiation therapy -sickle cell anemia -an unusual or allergic reaction to tbo-filgrastim, filgrastim, pegfilgrastim, other medicines, foods, dyes, or preservatives -pregnant or trying to get pregnant -breast-feeding How should I use this medicine? This medicine is for injection under the skin. If you get this medicine at home, you will be taught how to prepare and give this medicine. Refer to the Instructions for Use that come with your medication packaging. Use exactly as directed. Take your medicine at  regular intervals. Do not take your medicine more often than directed. It is important that you put your used needles and syringes in a special sharps container. Do not put them in a trash can. If you do not have a sharps container, call your pharmacist or healthcare provider to get one. Talk to your pediatrician regarding the use of this medicine in children. Special care may be needed. Overdosage: If you think you have taken too much of this medicine contact a poison control center or emergency room at once. NOTE: This medicine is only for you. Do not share this medicine with others. What if I miss a dose? It is important not to miss your dose. Call your doctor or health care professional if you miss a dose. What may interact with this medicine? This medicine may interact with the following medications: -medicines that may cause a release of neutrophils, such as lithium This list may not describe all possible interactions. Give your health care provider a list of all the medicines, herbs, non-prescription drugs, or dietary supplements you use. Also tell them if you smoke, drink alcohol, or use illegal drugs. Some items may interact with your medicine. What should I watch for   while using this medicine? You may need blood work done while you are taking this medicine. What side effects may I notice from receiving this medicine? Side effects that you should report to your doctor or health care professional as soon as possible: -allergic reactions like skin rash, itching or hives, swelling of the face, lips, or tongue -shortness of breath or breathing problems -fever -pain, redness, or irritation at site where injected -pinpoint red spots on the skin -stomach or side pain, or pain at the shoulder -swelling -tiredness -trouble passing urine Side effects that usually do not require medical attention (Report these to your doctor or health care professional if they continue or are  bothersome.): -bone pain -muscle pain This list may not describe all possible side effects. Call your doctor for medical advice about side effects. You may report side effects to FDA at 1-800-FDA-1088. Where should I keep my medicine? Keep out of the reach of children. Store in a refrigerator between 2 and 8 degrees C (36 and 46 degrees F). Keep in carton to protect from light. Throw away this medicine if it is left out of the refrigerator for more than 5 consecutive days. Throw away any unused medicine after the expiration date. NOTE: This sheet is a summary. It may not cover all possible information. If you have questions about this medicine, talk to your doctor, pharmacist, or health care provider.    2016, Elsevier/Gold Standard. (2013-04-20 11:52:29)   

## 2015-11-08 NOTE — Patient Instructions (Signed)

## 2015-11-09 ENCOUNTER — Ambulatory Visit (HOSPITAL_BASED_OUTPATIENT_CLINIC_OR_DEPARTMENT_OTHER): Payer: BLUE CROSS/BLUE SHIELD

## 2015-11-09 VITALS — BP 146/78 | HR 117 | Temp 98.5°F | Resp 20

## 2015-11-09 DIAGNOSIS — C541 Malignant neoplasm of endometrium: Secondary | ICD-10-CM

## 2015-11-09 MED ORDER — TBO-FILGRASTIM 480 MCG/0.8ML ~~LOC~~ SOSY
480.0000 ug | PREFILLED_SYRINGE | Freq: Once | SUBCUTANEOUS | Status: AC
  Administered 2015-11-09: 480 ug via SUBCUTANEOUS

## 2015-11-09 NOTE — Patient Instructions (Signed)

## 2015-11-24 ENCOUNTER — Other Ambulatory Visit: Payer: Self-pay | Admitting: Oncology

## 2015-11-25 ENCOUNTER — Ambulatory Visit: Payer: BLUE CROSS/BLUE SHIELD

## 2015-11-25 ENCOUNTER — Encounter: Payer: Self-pay | Admitting: Oncology

## 2015-11-25 ENCOUNTER — Ambulatory Visit (HOSPITAL_BASED_OUTPATIENT_CLINIC_OR_DEPARTMENT_OTHER): Payer: BLUE CROSS/BLUE SHIELD | Admitting: Oncology

## 2015-11-25 ENCOUNTER — Other Ambulatory Visit (HOSPITAL_BASED_OUTPATIENT_CLINIC_OR_DEPARTMENT_OTHER): Payer: BLUE CROSS/BLUE SHIELD

## 2015-11-25 ENCOUNTER — Ambulatory Visit: Payer: BLUE CROSS/BLUE SHIELD | Admitting: Internal Medicine

## 2015-11-25 VITALS — BP 145/75 | HR 99 | Temp 98.6°F | Resp 15 | Ht 60.0 in | Wt 171.3 lb

## 2015-11-25 DIAGNOSIS — Z95828 Presence of other vascular implants and grafts: Secondary | ICD-10-CM

## 2015-11-25 DIAGNOSIS — D500 Iron deficiency anemia secondary to blood loss (chronic): Secondary | ICD-10-CM | POA: Diagnosis not present

## 2015-11-25 DIAGNOSIS — E039 Hypothyroidism, unspecified: Secondary | ICD-10-CM | POA: Diagnosis not present

## 2015-11-25 DIAGNOSIS — Z6835 Body mass index (BMI) 35.0-35.9, adult: Secondary | ICD-10-CM

## 2015-11-25 DIAGNOSIS — D701 Agranulocytosis secondary to cancer chemotherapy: Secondary | ICD-10-CM

## 2015-11-25 DIAGNOSIS — C541 Malignant neoplasm of endometrium: Secondary | ICD-10-CM

## 2015-11-25 DIAGNOSIS — Z1239 Encounter for other screening for malignant neoplasm of breast: Secondary | ICD-10-CM

## 2015-11-25 DIAGNOSIS — T451X5A Adverse effect of antineoplastic and immunosuppressive drugs, initial encounter: Secondary | ICD-10-CM

## 2015-11-25 LAB — COMPREHENSIVE METABOLIC PANEL
ALBUMIN: 3.7 g/dL (ref 3.5–5.0)
ALK PHOS: 122 U/L (ref 40–150)
ALT: 32 U/L (ref 0–55)
ANION GAP: 12 meq/L — AB (ref 3–11)
AST: 24 U/L (ref 5–34)
BILIRUBIN TOTAL: 0.45 mg/dL (ref 0.20–1.20)
BUN: 15.3 mg/dL (ref 7.0–26.0)
CALCIUM: 9.9 mg/dL (ref 8.4–10.4)
CO2: 27 mEq/L (ref 22–29)
Chloride: 103 mEq/L (ref 98–109)
Creatinine: 0.7 mg/dL (ref 0.6–1.1)
Glucose: 99 mg/dl (ref 70–140)
Potassium: 3.8 mEq/L (ref 3.5–5.1)
Sodium: 141 mEq/L (ref 136–145)
Total Protein: 7.4 g/dL (ref 6.4–8.3)

## 2015-11-25 LAB — CBC WITH DIFFERENTIAL/PLATELET
BASO%: 0.3 % (ref 0.0–2.0)
Basophils Absolute: 0 10*3/uL (ref 0.0–0.1)
EOS%: 0.5 % (ref 0.0–7.0)
Eosinophils Absolute: 0 10*3/uL (ref 0.0–0.5)
HCT: 31.2 % — ABNORMAL LOW (ref 34.8–46.6)
HGB: 10.7 g/dL — ABNORMAL LOW (ref 11.6–15.9)
LYMPH%: 16.8 % (ref 14.0–49.7)
MCH: 30.2 pg (ref 25.1–34.0)
MCHC: 34.3 g/dL (ref 31.5–36.0)
MCV: 88.1 fL (ref 79.5–101.0)
MONO#: 0.4 10*3/uL (ref 0.1–0.9)
MONO%: 11.4 % (ref 0.0–14.0)
NEUT#: 2.6 10*3/uL (ref 1.5–6.5)
NEUT%: 71 % (ref 38.4–76.8)
Platelets: 133 10*3/uL — ABNORMAL LOW (ref 145–400)
RBC: 3.54 10*6/uL — ABNORMAL LOW (ref 3.70–5.45)
RDW: 14 % (ref 11.2–14.5)
WBC: 3.7 10*3/uL — ABNORMAL LOW (ref 3.9–10.3)
lymph#: 0.6 10*3/uL — ABNORMAL LOW (ref 0.9–3.3)

## 2015-11-25 MED ORDER — SODIUM CHLORIDE 0.9% FLUSH
10.0000 mL | INTRAVENOUS | Status: DC | PRN
  Administered 2015-11-25: 10 mL via INTRAVENOUS
  Filled 2015-11-25: qty 10

## 2015-11-25 MED ORDER — HEPARIN SOD (PORK) LOCK FLUSH 100 UNIT/ML IV SOLN
500.0000 [IU] | Freq: Once | INTRAVENOUS | Status: AC
  Administered 2015-11-25: 500 [IU] via INTRAVENOUS
  Filled 2015-11-25: qty 5

## 2015-11-25 NOTE — Progress Notes (Signed)
OFFICE PROGRESS NOTE   November 25, 2015   Physicians:Emma Rossi/ Laurette Schimke, Wilford Corner, MD/ [facility], Stan Head, Richardean Chimera, Jodi Geralds, _Grote  INTERVAL HISTORY:  Patient is seen, alone for visit, in continuing attention to adjuvant chemotherapy in process for IIIC1 grade 2 endometrial carcinoma. She received cycle 5 carbo taxol on 11-06-15, with granix 480 mg days 2,3,4.   Patient received IVF day, also helpful this cycle. She had no significant nausea and no vomiting, has pushed oral fluids which has improved palpitations. She was more fatigued and had "terrible aches" mostly in legs, lasted 2-3 days, have resolved entirely. Bowels moving well with diet changes. No SOB. 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 No problems with PAC.No bleeding. No LE swelling. Chronic knee problems bilaterally. No problem with flu vaccine. No increased peripheral neuropathy. Did not tolerate Holter, with skin reaction to adhesive from the electrode pads.  Remainder of 10 Point Review of Systems negative  PAC in Flu vaccine 10-28-15  She expects to get a puppy for Christmas, a "surprise" from her children  ONCOLOGIC HISTORY Patient had ~ 6 months of post menopausal bleeding, at times heavy, prior to reestablishing with Dr Richardean Chimera. [place] 02-12-15 showed slightly enlarged uterus with multiple intramural fibroids up to 3.5 cm, endometrial thickness 4 mm, polypoid lesion in cavity, ovaries grossly normal. She had hysteroscopic resection of endometrial polyp, with grade 2 endometrial adenocarcinoma. She was seen by gyn oncology on 03-29-15, with uterus large without other findings of concern. She had robotic laparoscopic hysterectomy BSO with bilateral sentinel node sampling and lysis of adhesions by Dr Nelly Rout on 05-02-15. Pathology [phone]) grade 2 endometrial adenocarcinoma with 90% invasion of myometrium (1.4 cm out of 1.5  cm), no involvement cervix or adnexa, + focal LVSI, 2 benign right sentinel nodes and no left pelvic nodes in specimen; MMR normal. Surgery was uncomplicated, patient DC on post op day 1; she has recovered well from surgery, returning to work after 3 weeks. . She had CT AP 05-24-15 with mild left common iliac adenopathy up to 1.1 cm as well as 0.7 cm low aortocaval node, and a 1.6 x 1.6 cm renal mass posterior lower left kidney indeterminate. PET 06-12-15 had SUV 4.1 at the 10 mm left common iliac node, other nodes too small for PET evaluation; mildly hyperdense lesion lower pole left kidney 1.8 cm. She saw Dr Roselind Messier in consultation on 06-06-15. Recommendation from surgical pathology and imaging is for chemotherapy and radiation in sandwich fashion.  First carboplatin taxol given 06-26-15, neutropenic with ANC 0.4 on day 13 cycle 1, gCSF added. She had cycle 3 chemotherapy on 08-07-15, followed by pelvic radiation and HDR thru 10-24-15. She resumed chemotherapy with cycle 4 carbo taxol on 10-16-15.  Objective:  Vital signs in last 24 hours:  BP (!) 145/75 (BP Location: Right Arm, Patient Position: Sitting) Comment: Made R.N. aware of elevated BP  Pulse 99   Temp 98.6 F (37 C) (Oral)   Resp 15   Ht 5' (1.524 m)   Wt 171 lb 4.8 oz (77.7 kg)   LMP 07/16/2014 Comment: pt states she seen a mild amount of blood( spotting)  SpO2 100%   BMI 33.45 kg/m  Weight down 2.5 lbs Alert, oriented and appropriate, looks comfortable, very pleasant as always. Ambulatory without difficulty.  Alopecia  HEENT:PERRL, sclerae not icteric. Oral mucosa moist without lesions, posterior pharynx clear.  Neck supple. No JVD.  Lymphatics:no  cervical,supraclavicular,  or inguinal adenopathy Resp: clear to auscultation bilaterally and normal percussion bilaterally Cardio: regular rate and rhythm. No gallop. OV:ANVBTYO obese,  soft, nontender, not distended, no mass or organomegaly. Normally active bowel sounds. Surgical  incisions not remarkable. Musculoskeletal/ Extremities: without pitting edema, cords, tenderness Neuro: no peripheral neuropathy. Otherwise nonfocal. PSYCH appropriate mood and affect Skin without rash, ecchymosis, petechiae Portacath-without erythema or tenderness  Lab Results:  Results for orders placed or performed in visit on 11/25/15  CBC with Differential  Result Value Ref Range   WBC 3.7 (L) 3.9 - 10.3 10e3/uL   NEUT# 2.6 1.5 - 6.5 10e3/uL   HGB 10.7 (L) 11.6 - 15.9 g/dL   HCT 06.0 (L) 04.5 - 99.7 %   Platelets 133 (L) 145 - 400 10e3/uL   MCV 88.1 79.5 - 101.0 fL   MCH 30.2 25.1 - 34.0 pg   MCHC 34.3 31.5 - 36.0 g/dL   RBC 7.41 (L) 4.23 - 9.53 10e6/uL   RDW 14.0 11.2 - 14.5 %   lymph# 0.6 (L) 0.9 - 3.3 10e3/uL   MONO# 0.4 0.1 - 0.9 10e3/uL   Eosinophils Absolute 0.0 0.0 - 0.5 10e3/uL   Basophils Absolute 0.0 0.0 - 0.1 10e3/uL   NEUT% 71.0 38.4 - 76.8 %   LYMPH% 16.8 14.0 - 49.7 %   MONO% 11.4 0.0 - 14.0 %   EOS% 0.5 0.0 - 7.0 %   BASO% 0.3 0.0 - 2.0 %  Comprehensive metabolic panel  Result Value Ref Range   Sodium 141 136 - 145 mEq/L   Potassium 3.8 3.5 - 5.1 mEq/L   Chloride 103 98 - 109 mEq/L   CO2 27 22 - 29 mEq/L   Glucose 99 70 - 140 mg/dl   BUN 20.2 7.0 - 33.4 mg/dL   Creatinine 0.7 0.6 - 1.1 mg/dL   Total Bilirubin 3.56 0.20 - 1.20 mg/dL   Alkaline Phosphatase 122 40 - 150 U/L   AST 24 5 - 34 U/L   ALT 32 0 - 55 U/L   Total Protein 7.4 6.4 - 8.3 g/dL   Albumin 3.7 3.5 - 5.0 g/dL   Calcium 9.9 8.4 - 86.1 mg/dL   Anion Gap 12 (H) 3 - 11 mEq/L   EGFR >90 >90 ml/min/1.73 m2     Studies/Results:  No results found.   Overdue mammograms, last 06-2014 at [facility], agrees to have these set up after chemo has completed  Medications: I have reviewed the patient's current medications. Ok to use aleve with food and good hydration for aches after treatment.  DISCUSSION Interval history reviewed. She agrees with recommendation to complete planned chemo  with cycle 6 on 11-27-15, with granix and additional IVF after. I will see her back with lab 11-30. She will have restaging scans prior to follow up with Dr Andrey Farmer ~ late Dec.   Patient aware that another medical oncologist will be following after first of year. She understands that PAC can be used for upcoming CT, then needs flush every 6-8 weeks when not otherwise used. Peripheral IV access is very difficult, may want to keep this central line for at least several months after completion of chemo.   Assessment/Plan:  1.IIIC1 grade 2 endometrioid endometrial carcinoma: She will have cycle 6 adjuvant carbo taxol on 11-27-15, with support as previously and follow up labs with this MD ~ 2 weeks later. . Plan restaging scans then follow up with gyn oncology.  2.Iron deficiency related to 6+ months of  vaginal bleeding: continue oral iron as ferrous fumarate. Hemoglobin a little lower today, but not requiring transfusion, expect this will improve out from chemo. 3.questionable mass left kidney and right nephrolithiasis: UA negative for blood 07-08-15. May need urology input for left kidney after present treatment completes. Will image again on upcoming CTs 4.hypothyroidism on medication, monitored by primary care 5.obesity, BMI 35. Exercise limited by chronic problems knees 6. Cholelithiasis, not presently symptomatic 7.diverticulitis and hemorrhoids by colonoscopy 2007. She is due repeat colonoscopy, after present treatment completed when OK per gyn oncology 8.minimal past tobacco 9.Complaints of intermittent tachycardia : cardiology aware, did not tolerate Holter with skin reaction to pads. HTN, elevated lipids. Good EF 2012 10.EMR mention of OSA 11.PAC placed by IR: will need to flush every 6-8 weeks when not otherwise used.   All questions answered and patient is in agreement with recommendations and plans. Chemo and granix orders confirmed, IVF orders placed.  Coordination with gyn oncology for  scans prior to follow up visit there.Time spent 25 min including >50% counseling and coordination of care.    Evlyn Clines, MD   11/25/2015, 5:32 PM

## 2015-11-27 ENCOUNTER — Encounter: Payer: Self-pay | Admitting: Oncology

## 2015-11-27 ENCOUNTER — Ambulatory Visit (HOSPITAL_BASED_OUTPATIENT_CLINIC_OR_DEPARTMENT_OTHER): Payer: BLUE CROSS/BLUE SHIELD

## 2015-11-27 ENCOUNTER — Other Ambulatory Visit: Payer: BLUE CROSS/BLUE SHIELD

## 2015-11-27 VITALS — BP 128/61 | HR 102 | Temp 98.2°F | Resp 18

## 2015-11-27 DIAGNOSIS — Z5111 Encounter for antineoplastic chemotherapy: Secondary | ICD-10-CM | POA: Diagnosis not present

## 2015-11-27 DIAGNOSIS — C541 Malignant neoplasm of endometrium: Secondary | ICD-10-CM | POA: Diagnosis not present

## 2015-11-27 MED ORDER — HEPARIN SOD (PORK) LOCK FLUSH 100 UNIT/ML IV SOLN
500.0000 [IU] | Freq: Once | INTRAVENOUS | Status: AC | PRN
  Administered 2015-11-27: 500 [IU]
  Filled 2015-11-27: qty 5

## 2015-11-27 MED ORDER — CARBOPLATIN CHEMO INJECTION 600 MG/60ML
598.0000 mg | Freq: Once | INTRAVENOUS | Status: AC
  Administered 2015-11-27: 600 mg via INTRAVENOUS
  Filled 2015-11-27: qty 60

## 2015-11-27 MED ORDER — LORAZEPAM 2 MG/ML IJ SOLN
0.5000 mg | Freq: Once | INTRAMUSCULAR | Status: AC | PRN
  Administered 2015-11-27: 0.5 mg via INTRAVENOUS

## 2015-11-27 MED ORDER — ONDANSETRON HCL 8 MG PO TABS
ORAL_TABLET | ORAL | Status: AC
  Filled 2015-11-27: qty 1

## 2015-11-27 MED ORDER — DIPHENHYDRAMINE HCL 50 MG/ML IJ SOLN
INTRAMUSCULAR | Status: AC
  Filled 2015-11-27: qty 1

## 2015-11-27 MED ORDER — SODIUM CHLORIDE 0.9 % IV SOLN
Freq: Once | INTRAVENOUS | Status: AC
  Administered 2015-11-27: 10:00:00 via INTRAVENOUS

## 2015-11-27 MED ORDER — LORAZEPAM 2 MG/ML IJ SOLN
INTRAMUSCULAR | Status: AC
  Filled 2015-11-27: qty 1

## 2015-11-27 MED ORDER — PACLITAXEL CHEMO INJECTION 300 MG/50ML
175.0000 mg/m2 | Freq: Once | INTRAVENOUS | Status: AC
  Administered 2015-11-27: 324 mg via INTRAVENOUS
  Filled 2015-11-27: qty 54

## 2015-11-27 MED ORDER — FAMOTIDINE IN NACL 20-0.9 MG/50ML-% IV SOLN
INTRAVENOUS | Status: AC
  Filled 2015-11-27: qty 50

## 2015-11-27 MED ORDER — ONDANSETRON HCL 8 MG PO TABS
8.0000 mg | ORAL_TABLET | Freq: Once | ORAL | Status: AC
  Administered 2015-11-27: 8 mg via ORAL

## 2015-11-27 MED ORDER — SODIUM CHLORIDE 0.9% FLUSH
10.0000 mL | INTRAVENOUS | Status: DC | PRN
  Administered 2015-11-27: 10 mL
  Filled 2015-11-27: qty 10

## 2015-11-27 MED ORDER — DIPHENHYDRAMINE HCL 50 MG/ML IJ SOLN
25.0000 mg | Freq: Once | INTRAMUSCULAR | Status: AC
  Administered 2015-11-27: 25 mg via INTRAVENOUS

## 2015-11-27 MED ORDER — FAMOTIDINE IN NACL 20-0.9 MG/50ML-% IV SOLN
20.0000 mg | Freq: Once | INTRAVENOUS | Status: AC
  Administered 2015-11-27: 20 mg via INTRAVENOUS

## 2015-11-27 MED ORDER — SODIUM CHLORIDE 0.9 % IV SOLN
Freq: Once | INTRAVENOUS | Status: AC
  Administered 2015-11-27: 10:00:00 via INTRAVENOUS
  Filled 2015-11-27: qty 5

## 2015-11-27 NOTE — Progress Notes (Signed)
HR 110 Dr. Marko Plume aware, okay to proceed with treatment.

## 2015-11-27 NOTE — Patient Instructions (Signed)
[facility] Discharge Instructions for Patients Receiving Chemotherapy  Today you received the following chemotherapy agents Taxol and Carboplatin. To help prevent nausea and vomiting after your treatment, we encourage you to take your nausea medication as directed.  If you develop nausea and vomiting that is not controlled by your nausea medication, call the clinic.   BELOW ARE SYMPTOMS THAT SHOULD BE REPORTED IMMEDIATELY:  *FEVER GREATER THAN 100.5 F  *CHILLS WITH OR WITHOUT FEVER  NAUSEA AND VOMITING THAT IS NOT CONTROLLED WITH YOUR NAUSEA MEDICATION  *UNUSUAL SHORTNESS OF BREATH  *UNUSUAL BRUISING OR BLEEDING  TENDERNESS IN MOUTH AND THROAT WITH OR WITHOUT PRESENCE OF ULCERS  *URINARY PROBLEMS  *BOWEL PROBLEMS  UNUSUAL RASH Items with * indicate a potential emergency and should be followed up as soon as possible.  Feel free to call the clinic you have any questions or concerns. The clinic phone number is [phone].  Please show the CHEMO ALERT CARD at check-in to the Emergency Department and triage nurse.    

## 2015-11-28 ENCOUNTER — Encounter: Payer: Self-pay | Admitting: Radiation Oncology

## 2015-11-28 ENCOUNTER — Ambulatory Visit (HOSPITAL_BASED_OUTPATIENT_CLINIC_OR_DEPARTMENT_OTHER): Payer: BLUE CROSS/BLUE SHIELD

## 2015-11-28 ENCOUNTER — Ambulatory Visit
Admission: RE | Admit: 2015-11-28 | Discharge: 2015-11-28 | Disposition: A | Discharge status: Home / Self Care | Payer: BLUE CROSS/BLUE SHIELD | Source: Ambulatory Visit | Attending: Radiation Oncology | Admitting: Radiation Oncology

## 2015-11-28 VITALS — BP 141/76 | HR 99 | Temp 98.3°F | Resp 20

## 2015-11-28 VITALS — BP 119/75 | HR 99 | Temp 98.0°F | Ht 60.0 in | Wt 168.6 lb

## 2015-11-28 DIAGNOSIS — C541 Malignant neoplasm of endometrium: Secondary | ICD-10-CM | POA: Insufficient documentation

## 2015-11-28 DIAGNOSIS — Z888 Allergy status to other drugs, medicaments and biological substances status: Secondary | ICD-10-CM | POA: Insufficient documentation

## 2015-11-28 DIAGNOSIS — Z79899 Other long term (current) drug therapy: Secondary | ICD-10-CM | POA: Diagnosis not present

## 2015-11-28 DIAGNOSIS — Z923 Personal history of irradiation: Secondary | ICD-10-CM | POA: Diagnosis not present

## 2015-11-28 DIAGNOSIS — Z5189 Encounter for other specified aftercare: Secondary | ICD-10-CM | POA: Diagnosis not present

## 2015-11-28 DIAGNOSIS — Z9221 Personal history of antineoplastic chemotherapy: Secondary | ICD-10-CM | POA: Insufficient documentation

## 2015-11-28 MED ORDER — TBO-FILGRASTIM 480 MCG/0.8ML ~~LOC~~ SOSY
480.0000 ug | PREFILLED_SYRINGE | Freq: Once | SUBCUTANEOUS | Status: AC
  Administered 2015-11-28: 480 ug via SUBCUTANEOUS
  Filled 2015-11-28: qty 0.8

## 2015-11-28 NOTE — Progress Notes (Signed)
  Home Care Instructions for the Insertion and Care of Your Vaginal Dilator  Why Do I Need a Vaginal Dilator?  Internal radiation therapy may cause scar tissue to form at the top of your vagina (vaginal cuff).  This may make vaginal examinations difficult in the future. You can prevent scar tissue from forming by using a vaginal dilator (a smooth plastic rod), and/or by having regular sexual intercourse.  If not using the dilator you should be having intercourse two or three times a week.  If you are unable to have intercourse, you should use your vaginal dilator.  You may have some spotting or bleeding from your dilator or intercourse the first few times. You may also have some discomfort. If discomfort occurs with intercourse, you and your partner may need to stop for a while and try again later.  How to Use Your Vaginal Dilator  - Wash the dilator with soap and water before and after each use. - Use the dilator 3 times a week for 10 minutes each time. - Check the dilator to be sure it is smooth. Do not use the dilator if you find any roughspots. - Coat the dilator with K-Y Jelly, Astroglide, or Replens. Do not use Vaseline, baby oil, or other oil based lubricants. They are not water-soluble and can be irritating to the tissues in the vagina. - Lie on your back with your knees bent and legs apart. - Insert the rounded end of the dilator into your vagina as far as it will go without causing pain or discomfort. - Close your knees and slowly straighten your legs. - Keep the dilator in your vagina for about 10 to 15 minutes. [facility] your knees, open your legs, and gently remove the dilator. - Gently cleanse the skin around the vaginal opening. - Wash the dilator after each use. -  It is important that you use the dilator routinely until instructed otherwise by your doctor.

## 2015-11-28 NOTE — Progress Notes (Signed)
Lydia Walsh is here for folow up.  She denies having pain, bladder/bowel issues or vaginal bleeding.  She reports feeling good today.  She finished chemotherapy yesterday.  She has been given a size S+ and M vaginal dilator.  BP 119/75 (BP Location: Right Arm, Patient Position: Sitting)   Pulse 99   Temp 98 F (36.7 C) (Oral)   Ht 5' (1.524 m)   Wt 168 lb 9.6 oz (76.5 kg)   LMP 07/16/2014 Comment: pt states she seen a mild amount of blood( spotting)  SpO2 98%   BMI 32.93 kg/m    Wt Readings from Last 3 Encounters:  11/28/15 168 lb 9.6 oz (76.5 kg)  11/25/15 171 lb 4.8 oz (77.7 kg)  11/01/15 173 lb 14.4 oz (78.9 kg)

## 2015-11-28 NOTE — Progress Notes (Signed)
Radiation Oncology         [phone] ________________________________  Name: Lydia Walsh MRN: [id number]  Date: 11/28/2015  DOB: [birth year]  Follow-Up Visit Note  CC: Dellia Nims, MD  Gordy Levan, MD    ICD-9-CM ICD-10-CM   1. Endometrial cancer ([place]) 182.0 C54.1     Diagnosis:   Endometrial Cancer ([place]). Stage IIIC-1 Grade 2 endometrial cancer  Interval Since Last Radiation:  1 month  10/15/15- 10/24/15 HDR Treatment 09/02/15-10/08/15 50 Gy pelvic radiation  Narrative:  The patient returns today for routine follow-up.  She denies pain, bladder/bowel issues, or vaginal bleeding. She finished chemotherapy yesterday and reports feeling good today. Nurse provided the patient with a size S+ and M vaginal dilator                              ALLERGIES:  is allergic to hctz [hydrochlorothiazide].  Meds: Current Outpatient Prescriptions  Medication Sig Dispense Refill  . allopurinol (ZYLOPRIM) 100 MG tablet TAKE ONE TABLET BY MOUTH ONCE DAILY 90 tablet 1  . amLODipine (NORVASC) 10 MG tablet Take 1 tablet (10 mg total) by mouth daily. 90 tablet 3  . Ferrous Fumarate (HEMOCYTE - 106 MG FE) 324 (106 Fe) MG TABS tablet Take 1 tablet (106 mg of iron total) by mouth daily. On an empty stomach with OJ 30 tablet 4  . levothyroxine (SYNTHROID, LEVOTHROID) 125 MCG tablet Take 1 tablet (125 mcg total) by mouth daily before breakfast. 90 tablet 3  . lidocaine-prilocaine (EMLA) cream Apply to port 1-2 hours before accessing with a needle. Cover with plastic wrap. 30 g 1  . Melatonin 3 MG TABS Take 1 tablet by mouth at bedtime as needed.    . potassium chloride (K-DUR) 10 MEQ tablet Take 10 mEq by mouth daily.    . simvastatin (ZOCOR) 20 MG tablet Take 1 tablet (20 mg total) by mouth every evening. 30 tablet 11  . spironolactone (ALDACTONE) 25 MG tablet TAKE ONE TABLET BY MOUTH ONCE DAILY 30 tablet 5  . dexamethasone (DECADRON) 4 MG tablet Take 5 tablets with food 12 hrs and 6 hrs  before taxol Chemotherapy. (Patient not taking: Reported on 11/28/2015) 10 tablet 2  . diphenoxylate-atropine (LOMOTIL) 2.5-0.025 MG tablet Take 2 tablets by mouth 4 (four) times daily as needed for diarrhea or loose stools. (Patient not taking: Reported on 11/28/2015) 30 tablet 1  . docusate sodium (COLACE) 100 MG capsule Take 1 capsule (100 mg total) by mouth 2 (two) times daily. (Patient not taking: Reported on 11/28/2015) 30 capsule 0  . HYDROcodone-acetaminophen (NORCO/VICODIN) 5-325 MG tablet Take 1 tablet by mouth every 4 (four) hours as needed for severe pain. (Patient not taking: Reported on 11/28/2015) 10 tablet 0  . ibuprofen (ADVIL,MOTRIN) 200 MG tablet Take 200 mg by mouth every 6 (six) hours as needed for mild pain. Reported on 07/15/2015    . loperamide (IMODIUM A-D) 2 MG tablet Take 1 tablet (2 mg total) by mouth 4 (four) times daily as needed for diarrhea or loose stools. (Patient not taking: Reported on 11/28/2015) 30 tablet 1  . LORazepam (ATIVAN) 0.5 MG tablet Place 1 tablet under the tongue or swallow every 6 hours as needed for nausea. Will make sleepy. (Patient not taking: Reported on 11/28/2015) 20 tablet 0  . omeprazole (PRILOSEC) 20 MG capsule Take 1 capsule (20 mg total) by mouth daily. (Patient not taking: Reported on 11/28/2015) 30 capsule 2  .  ondansetron (ZOFRAN) 8 MG tablet Take 1 tablet (8 mg total) by mouth every 8 (eight) hours as needed for nausea. Will make drowsy. (Patient not taking: Reported on 11/28/2015) 30 tablet 1   Current Facility-Administered Medications  Medication Dose Route Frequency Provider Last Rate Last Dose  . sodium chloride 0.9 % injection 10 mL  10 mL Intravenous Once Gery Pray, MD        Physical Findings: The patient is in no acute distress. Patient is alert and oriented.  height is 5' (1.524 m) and weight is 168 lb 9.6 oz (76.5 kg). Her oral temperature is 98 F (36.7 C). Her blood pressure is 119/75 and her pulse is 99. Her oxygen  saturation is 98%. .  No significant changes. Lungs are clear to auscultation bilaterally. Heart has regular rate and rhythm. No palpable cervical, supraclavicular, or axillary adenopathy. Abdomen soft, non-tender, normal bowel sounds.   Lab Findings: Lab Results  Component Value Date   WBC 3.7 (L) 11/25/2015   HGB 10.7 (L) 11/25/2015   HCT 31.2 (L) 11/25/2015   MCV 88.1 11/25/2015   PLT 133 (L) 11/25/2015    Radiographic Findings: No results found.  Impression:  The patient is recovering from the effects of radiation.   Plan:  Routine follow up in radiation oncology in 4 months. Follow up with Dr. Denman George in 1 months. Patient was given a vaginal dilator and instructions on it's use.  ____________________________________  This document serves as a record of services personally performed by Gery Pray, MD. It was created on his behalf by Bethann Humble, a trained medical scribe. The creation of this record is based on the scribe's personal observations and the provider's statements to them. This document has been checked and approved by the attending provider.

## 2015-11-28 NOTE — Patient Instructions (Signed)

## 2015-11-28 NOTE — Progress Notes (Signed)
  Radiation Oncology         [phone] ________________________________  Name: Lydia Walsh MRN: [id number]  Date: 11/28/2015  DOB: 18-May-1955  End of Treatment Note  Diagnosis:   Stage IIIC-1 Grade 2 endometrial cancer    Indication for treatment:  Curative       Radiation treatment dates:   1) 09/02/15-10/08/15     2) 10/15/15, 10/22/15, 10/24/15  Site/dose:   1) Pelvis/ 50 Gy in 25 fx   2) Vaginal Cuff/ 18 Gy in 3 fx  Beams/energy:   1) IMRT for pelvis field / 6X    2) HDR-vaginal/ Iridium HDR  Narrative: The patient tolerated radiation treatment relatively well.   Patient complained of dysuria, and diarrhea x 3/day during treatment.  Plan: The patient has completed radiation treatment. The patient will return to radiation oncology clinic for routine followup in one month. I advised them to call or return sooner if they have any questions or concerns related to their recovery or treatment.  -----------------------------------  Blair Promise, PhD, MD  This document serves as a record of services personally performed by Gery Pray, MD. It was created on his behalf by Bethann Humble, a trained medical scribe. The creation of this record is based on the scribe's personal observations and the provider's statements to them. This document has been checked and approved by the attending provider.

## 2015-11-28 NOTE — Addendum Note (Signed)
Encounter addended by: Jacqulyn Liner, RN on: 11/28/2015 11:44 AM<BR>    Actions taken: Charge Capture section accepted

## 2015-11-29 ENCOUNTER — Ambulatory Visit: Payer: BLUE CROSS/BLUE SHIELD | Admitting: Cardiovascular Disease

## 2015-11-29 ENCOUNTER — Ambulatory Visit (HOSPITAL_BASED_OUTPATIENT_CLINIC_OR_DEPARTMENT_OTHER): Payer: BLUE CROSS/BLUE SHIELD

## 2015-11-29 ENCOUNTER — Ambulatory Visit: Payer: BLUE CROSS/BLUE SHIELD

## 2015-11-29 ENCOUNTER — Other Ambulatory Visit: Payer: Self-pay | Admitting: *Deleted

## 2015-11-29 VITALS — BP 107/55 | HR 96 | Temp 98.9°F | Resp 20

## 2015-11-29 DIAGNOSIS — Z5189 Encounter for other specified aftercare: Secondary | ICD-10-CM

## 2015-11-29 DIAGNOSIS — C541 Malignant neoplasm of endometrium: Secondary | ICD-10-CM | POA: Diagnosis not present

## 2015-11-29 MED ORDER — SODIUM CHLORIDE 0.9% FLUSH
10.0000 mL | INTRAVENOUS | Status: AC | PRN
  Administered 2015-11-29: 10 mL
  Filled 2015-11-29: qty 10

## 2015-11-29 MED ORDER — ACETAMINOPHEN 325 MG PO TABS
ORAL_TABLET | ORAL | Status: AC
  Filled 2015-11-29: qty 1

## 2015-11-29 MED ORDER — ACETAMINOPHEN 325 MG PO TABS
325.0000 mg | ORAL_TABLET | Freq: Once | ORAL | Status: AC
  Administered 2015-11-29: 325 mg via ORAL

## 2015-11-29 MED ORDER — HEPARIN SOD (PORK) LOCK FLUSH 100 UNIT/ML IV SOLN
500.0000 [IU] | INTRAVENOUS | Status: AC | PRN
Start: 2015-11-29 — End: 2015-11-29
  Administered 2015-11-29: 500 [IU]
  Filled 2015-11-29: qty 5

## 2015-11-29 MED ORDER — SODIUM CHLORIDE 0.9 % IV SOLN
INTRAVENOUS | Status: DC
  Administered 2015-11-29: 08:00:00 via INTRAVENOUS

## 2015-11-29 MED ORDER — TBO-FILGRASTIM 480 MCG/0.8ML ~~LOC~~ SOSY
480.0000 ug | PREFILLED_SYRINGE | Freq: Once | SUBCUTANEOUS | Status: AC
  Administered 2015-11-29: 480 ug via SUBCUTANEOUS
  Filled 2015-11-29: qty 0.8

## 2015-11-29 MED ORDER — LORATADINE 10 MG PO TABS
10.0000 mg | ORAL_TABLET | Freq: Once | ORAL | Status: AC
  Administered 2015-11-29: 10 mg via ORAL
  Filled 2015-11-29: qty 1

## 2015-11-29 NOTE — Patient Instructions (Signed)
Dehydration, Adult Dehydration is when there is not enough fluid or water in your body. This happens when you lose more fluids than you take in. Dehydration can range from mild to very bad. It should be treated right away to keep it from getting very bad. Symptoms of mild dehydration may include:  Thirst.  Dry lips.  Slightly dry mouth.  Dry, warm skin.  Dizziness. Symptoms of moderate dehydration may include:  Very dry mouth.  Muscle cramps.  Dark pee (urine). Pee may be the color of tea.  Your body making less pee.  Your eyes making fewer tears.  Heartbeat that is uneven or faster than normal (palpitations).  Headache.  Light-headedness, especially when you stand up from sitting.  Fainting (syncope). Symptoms of very bad dehydration may include:  Changes in skin, such as:  Cold and clammy skin.  Blotchy (mottled) or pale skin.  Skin that does not quickly return to normal after being lightly pinched and let go (poor skin turgor).  Changes in body fluids, such as:  Feeling very thirsty.  Your eyes making fewer tears.  Not sweating when body temperature is high, such as in hot weather.  Your body making very little pee.  Changes in vital signs, such as:  Weak pulse.  Pulse that is more than 100 beats a minute when you are sitting still.  Fast breathing.  Low blood pressure.  Other changes, such as:  Sunken eyes.  Cold hands and feet.  Confusion.  Lack of energy (lethargy).  Trouble waking up from sleep.  Short-term weight loss.  Unconsciousness. Follow these instructions at home:  If told by your doctor, drink an ORS:  Make an ORS by using instructions on the package.  Start by drinking small amounts, about  cup (120 mL) every 5-10 minutes.  Slowly drink more until you have had the amount that your doctor said to have.  Drink enough clear fluid to keep your pee clear or pale yellow. If you were told to drink an ORS, finish the ORS  first, then start slowly drinking clear fluids. Drink fluids such as:  Water. Do not drink only water by itself. Doing that can make the salt (sodium) level in your body get too low (hyponatremia).  Ice chips.  Fruit juice that you have added water to (diluted).  Low-calorie sports drinks.  Avoid:  Alcohol.  Drinks that have a lot of sugar. These include high-calorie sports drinks, fruit juice that does not have water added, and soda.  Caffeine.  Foods that are greasy or have a lot of fat or sugar.  Take over-the-counter and prescription medicines only as told by your doctor.  Do not take salt tablets. Doing that can make the salt level in your body get too high (hypernatremia).  Eat foods that have minerals (electrolytes). Examples include bananas, oranges, potatoes, tomatoes, and spinach.  Keep all follow-up visits as told by your doctor. This is important. Contact a doctor if:  You have belly (abdominal) pain that:  Gets worse.  Stays in one area (localizes).  You have a rash.  You have a stiff neck.  You get angry or annoyed more easily than normal (irritability).  You are more sleepy than normal.  You have a harder time waking up than normal.  You feel:  Weak.  Dizzy.  Very thirsty.  You have peed (urinated) only a small amount of very dark pee during 6-8 hours. Get help right away if:  You have symptoms of  very bad dehydration.  You cannot drink fluids without throwing up (vomiting).  Your symptoms get worse with treatment.  You have a fever.  You have a very bad headache.  You are throwing up or having watery poop (diarrhea) and it:  Gets worse.  Does not go away.  You have blood or something green (bile) in your throw-up.  You have blood in your poop (stool). This may cause poop to look black and tarry.  You have not peed in 6-8 hours.  You pass out (faint).  Your heart rate when you are sitting still is more than 100 beats a  minute.  You have trouble breathing. This information is not intended to replace advice given to you by your health care provider. Make sure you discuss any questions you have with your health care provider. Document Released: 10/25/2008 Document Revised: 07/19/2015 Document Reviewed: 02/22/2015 Elsevier Interactive Patient Education  2017 [place].   Tbo-Filgrastim injection What is this medicine? TBO-FILGRASTIM (T B O fil GRA stim) is a granulocyte colony-stimulating factor that stimulates the growth of neutrophils, a type of white blood cell important in the body's fight against infection. It is used to reduce the incidence of fever and infection in patients with certain types of cancer who are receiving chemotherapy that affects the bone marrow. COMMON BRAND NAME(S): Granix What should I tell my health care provider before I take this medicine? They need to know if you have any of these conditions: -ongoing radiation therapy -sickle cell anemia -an unusual or allergic reaction to tbo-filgrastim, filgrastim, pegfilgrastim, other medicines, foods, dyes, or preservatives -pregnant or trying to get pregnant -breast-feeding How should I use this medicine? This medicine is for injection under the skin. If you get this medicine at home, you will be taught how to prepare and give this medicine. Refer to the Instructions for Use that come with your medication packaging. Use exactly as directed. Take your medicine at regular intervals. Do not take your medicine more often than directed. It is important that you put your used needles and syringes in a special sharps container. Do not put them in a trash can. If you do not have a sharps container, call your pharmacist or healthcare provider to get one. Talk to your pediatrician regarding the use of this medicine in children. Special care may be needed. What if I miss a dose? It is important not to miss your dose. Call your doctor or health care  professional if you miss a dose. What may interact with this medicine? This medicine may interact with the following medications: -medicines that may cause a release of neutrophils, such as lithium What should I watch for while using this medicine? You may need blood work done while you are taking this medicine. What side effects may I notice from receiving this medicine? Side effects that you should report to your doctor or health care professional as soon as possible: -allergic reactions like skin rash, itching or hives, swelling of the face, lips, or tongue -shortness of breath or breathing problems -fever -pain, redness, or irritation at site where injected -pinpoint red spots on the skin -stomach or side pain, or pain at the shoulder -swelling -tiredness -trouble passing urine Side effects that usually do not require medical attention (report to your doctor or health care professional if they continue or are bothersome): -bone pain -muscle pain Where should I keep my medicine? Keep out of the reach of children. Store in a refrigerator between 2 and  8 degrees C (36 and 46 degrees F). Keep in carton to protect from light. Throw away this medicine if it is left out of the refrigerator for more than 5 consecutive days. Throw away any unused medicine after the expiration date.  2017 Elsevier/Gold Standard (2015-01-31 12:15:25)

## 2015-11-29 NOTE — Progress Notes (Signed)
Pt complaint of bone pain from granix. Dr. Marko Plume ordered 325mg  tylenol and 10mg  claritin PO. Reinforced importance of pt to get claritin OTC to begin taking at home post taxol and granix to prevent and tx bone pain. Pt verbalized understanding.

## 2015-11-30 ENCOUNTER — Ambulatory Visit (HOSPITAL_BASED_OUTPATIENT_CLINIC_OR_DEPARTMENT_OTHER): Payer: BLUE CROSS/BLUE SHIELD

## 2015-11-30 VITALS — BP 139/85 | HR 105 | Temp 99.1°F | Resp 20

## 2015-11-30 DIAGNOSIS — C541 Malignant neoplasm of endometrium: Secondary | ICD-10-CM | POA: Diagnosis not present

## 2015-11-30 MED ORDER — TBO-FILGRASTIM 480 MCG/0.8ML ~~LOC~~ SOSY
480.0000 ug | PREFILLED_SYRINGE | Freq: Once | SUBCUTANEOUS | Status: AC
  Administered 2015-11-30: 480 ug via SUBCUTANEOUS

## 2015-11-30 NOTE — Patient Instructions (Signed)
Tbo-Filgrastim injection What is this medicine? TBO-FILGRASTIM (T B O fil GRA stim) is a granulocyte colony-stimulating factor that stimulates the growth of neutrophils, a type of white blood cell important in the body's fight against infection. It is used to reduce the incidence of fever and infection in patients with certain types of cancer who are receiving chemotherapy that affects the bone marrow. COMMON BRAND NAME(S): Granix What should I tell my health care provider before I take this medicine? They need to know if you have any of these conditions: -ongoing radiation therapy -sickle cell anemia -an unusual or allergic reaction to tbo-filgrastim, filgrastim, pegfilgrastim, other medicines, foods, dyes, or preservatives -pregnant or trying to get pregnant -breast-feeding How should I use this medicine? This medicine is for injection under the skin. If you get this medicine at home, you will be taught how to prepare and give this medicine. Refer to the Instructions for Use that come with your medication packaging. Use exactly as directed. Take your medicine at regular intervals. Do not take your medicine more often than directed. It is important that you put your used needles and syringes in a special sharps container. Do not put them in a trash can. If you do not have a sharps container, call your pharmacist or healthcare provider to get one. Talk to your pediatrician regarding the use of this medicine in children. Special care may be needed. What if I miss a dose? It is important not to miss your dose. Call your doctor or health care professional if you miss a dose. What may interact with this medicine? This medicine may interact with the following medications: -medicines that may cause a release of neutrophils, such as lithium What should I watch for while using this medicine? You may need blood work done while you are taking this medicine. What side effects may I notice from receiving  this medicine? Side effects that you should report to your doctor or health care professional as soon as possible: -allergic reactions like skin rash, itching or hives, swelling of the face, lips, or tongue -shortness of breath or breathing problems -fever -pain, redness, or irritation at site where injected -pinpoint red spots on the skin -stomach or side pain, or pain at the shoulder -swelling -tiredness -trouble passing urine Side effects that usually do not require medical attention (report to your doctor or health care professional if they continue or are bothersome): -bone pain -muscle pain Where should I keep my medicine? Keep out of the reach of children. Store in a refrigerator between 2 and 8 degrees C (36 and 46 degrees F). Keep in carton to protect from light. Throw away this medicine if it is left out of the refrigerator for more than 5 consecutive days. Throw away any unused medicine after the expiration date.  2017 Elsevier/Gold Standard (2015-01-31 12:15:25)  

## 2015-12-02 ENCOUNTER — Encounter: Payer: BLUE CROSS/BLUE SHIELD | Admitting: Internal Medicine

## 2015-12-03 ENCOUNTER — Other Ambulatory Visit: Payer: Self-pay | Admitting: Internal Medicine

## 2015-12-03 DIAGNOSIS — R1310 Dysphagia, unspecified: Secondary | ICD-10-CM

## 2015-12-03 DIAGNOSIS — R1319 Other dysphagia: Secondary | ICD-10-CM

## 2015-12-06 ENCOUNTER — Telehealth: Payer: Self-pay

## 2015-12-06 DIAGNOSIS — R1319 Other dysphagia: Secondary | ICD-10-CM

## 2015-12-06 DIAGNOSIS — R1310 Dysphagia, unspecified: Secondary | ICD-10-CM

## 2015-12-06 MED ORDER — OMEPRAZOLE 20 MG PO CPDR: 20.0000 mg | DELAYED_RELEASE_CAPSULE | Freq: Every day | ORAL | 0 refills | Status: DC

## 2015-12-06 NOTE — Telephone Encounter (Signed)
Pt requested refill of omeprazole with her PCP on 11/21. They have not filled it and are closed today. Pt is asking if Dr Marko Plume can help her at least through the weekend. She is having a lot of reflux. Please call her back with response.

## 2015-12-06 NOTE — Telephone Encounter (Signed)
-----   Message from Gordy Levan, MD sent at 12/06/2015  9:39 AM EST ----- Regarding: omeprazole Triage note seen: Pt requested refill of omeprazole with her PCP on 11/21. They have not filled it and are closed today. Pt is asking if Dr Marko Plume can help her at least through the weekend. She is having a lot of reflux. Please call her back with response.   MD response: OK to refill omeprazole x 1 month, then by PCP.

## 2015-12-06 NOTE — Telephone Encounter (Signed)
S/w pt that omeprazole refilled but next time she needs to use her regular doctor. Pt is appreciative.

## 2015-12-08 ENCOUNTER — Other Ambulatory Visit: Payer: Self-pay | Admitting: Oncology

## 2015-12-08 DIAGNOSIS — C541 Malignant neoplasm of endometrium: Secondary | ICD-10-CM

## 2015-12-08 DIAGNOSIS — N2889 Other specified disorders of kidney and ureter: Secondary | ICD-10-CM

## 2015-12-12 ENCOUNTER — Other Ambulatory Visit (HOSPITAL_BASED_OUTPATIENT_CLINIC_OR_DEPARTMENT_OTHER): Payer: BLUE CROSS/BLUE SHIELD

## 2015-12-12 ENCOUNTER — Ambulatory Visit (HOSPITAL_BASED_OUTPATIENT_CLINIC_OR_DEPARTMENT_OTHER): Payer: BLUE CROSS/BLUE SHIELD | Admitting: Oncology

## 2015-12-12 ENCOUNTER — Ambulatory Visit: Payer: BLUE CROSS/BLUE SHIELD

## 2015-12-12 VITALS — BP 140/67 | HR 93 | Temp 97.3°F | Resp 18 | Ht 60.0 in | Wt 172.4 lb

## 2015-12-12 DIAGNOSIS — C541 Malignant neoplasm of endometrium: Secondary | ICD-10-CM

## 2015-12-12 DIAGNOSIS — N2889 Other specified disorders of kidney and ureter: Secondary | ICD-10-CM

## 2015-12-12 DIAGNOSIS — D6481 Anemia due to antineoplastic chemotherapy: Secondary | ICD-10-CM

## 2015-12-12 DIAGNOSIS — E876 Hypokalemia: Secondary | ICD-10-CM

## 2015-12-12 DIAGNOSIS — E039 Hypothyroidism, unspecified: Secondary | ICD-10-CM

## 2015-12-12 DIAGNOSIS — T451X5A Adverse effect of antineoplastic and immunosuppressive drugs, initial encounter: Secondary | ICD-10-CM

## 2015-12-12 DIAGNOSIS — Z95828 Presence of other vascular implants and grafts: Secondary | ICD-10-CM

## 2015-12-12 DIAGNOSIS — E669 Obesity, unspecified: Secondary | ICD-10-CM

## 2015-12-12 DIAGNOSIS — Z6835 Body mass index (BMI) 35.0-35.9, adult: Secondary | ICD-10-CM

## 2015-12-12 DIAGNOSIS — D6959 Other secondary thrombocytopenia: Secondary | ICD-10-CM

## 2015-12-12 DIAGNOSIS — D701 Agranulocytosis secondary to cancer chemotherapy: Secondary | ICD-10-CM | POA: Diagnosis not present

## 2015-12-12 LAB — URINALYSIS, MICROSCOPIC - CHCC
Bilirubin (Urine): NEGATIVE
Glucose: NEGATIVE mg/dL
Ketones: NEGATIVE mg/dL
Nitrite: NEGATIVE
Protein: NEGATIVE mg/dL
Specific Gravity, Urine: 1.005 (ref 1.003–1.035)
Urobilinogen, UR: 0.2 mg/dL (ref 0.2–1)
pH: 6 (ref 4.6–8.0)

## 2015-12-12 LAB — COMPREHENSIVE METABOLIC PANEL WITH GFR
ALT: 25 U/L (ref 0–55)
AST: 23 U/L (ref 5–34)
Albumin: 3.4 g/dL — ABNORMAL LOW (ref 3.5–5.0)
Alkaline Phosphatase: 112 U/L (ref 40–150)
Anion Gap: 11 meq/L (ref 3–11)
BUN: 11.7 mg/dL (ref 7.0–26.0)
CO2: 26 meq/L (ref 22–29)
Calcium: 9.4 mg/dL (ref 8.4–10.4)
Chloride: 102 meq/L (ref 98–109)
Creatinine: 0.6 mg/dL (ref 0.6–1.1)
EGFR: >90 ml/min/1.73 m2
Glucose: 100 mg/dL (ref 70–140)
Potassium: 3.1 meq/L — ABNORMAL LOW (ref 3.5–5.1)
Sodium: 139 meq/L (ref 136–145)
Total Bilirubin: 0.4 mg/dL (ref 0.20–1.20)
Total Protein: 6.8 g/dL (ref 6.4–8.3)

## 2015-12-12 LAB — CBC WITH DIFFERENTIAL/PLATELET
BASO%: 0.5 % (ref 0.0–2.0)
Basophils Absolute: 0 10*3/uL (ref 0.0–0.1)
EOS ABS: 0 10*3/uL (ref 0.0–0.5)
EOS%: 0.5 % (ref 0.0–7.0)
HCT: 27.4 % — ABNORMAL LOW (ref 34.8–46.6)
HGB: 9.4 g/dL — ABNORMAL LOW (ref 11.6–15.9)
LYMPH%: 17.6 % (ref 14.0–49.7)
MCH: 30.1 pg (ref 25.1–34.0)
MCHC: 34.3 g/dL (ref 31.5–36.0)
MCV: 87.8 fL (ref 79.5–101.0)
MONO#: 0.2 10*3/uL (ref 0.1–0.9)
MONO%: 11 % (ref 0.0–14.0)
NEUT#: 1.5 10*3/uL (ref 1.5–6.5)
NEUT%: 70.4 % (ref 38.4–76.8)
PLATELETS: 84 10*3/uL — AB (ref 145–400)
RBC: 3.12 10*6/uL — AB (ref 3.70–5.45)
RDW: 14.4 % (ref 11.2–14.5)
WBC: 2.1 10*3/uL — AB (ref 3.9–10.3)
lymph#: 0.4 10*3/uL — ABNORMAL LOW (ref 0.9–3.3)
nRBC: 0 % (ref 0–0)

## 2015-12-12 MED ORDER — SODIUM CHLORIDE 0.9% FLUSH
10.0000 mL | INTRAVENOUS | Status: DC | PRN
  Administered 2015-12-12: 10 mL via INTRAVENOUS
  Filled 2015-12-12: qty 10

## 2015-12-12 MED ORDER — HEPARIN SOD (PORK) LOCK FLUSH 100 UNIT/ML IV SOLN
500.0000 [IU] | Freq: Once | INTRAVENOUS | Status: AC
  Administered 2015-12-12: 500 [IU] via INTRAVENOUS
  Filled 2015-12-12: qty 5

## 2015-12-12 NOTE — Progress Notes (Signed)
OFFICE PROGRESS NOTE   December 12, 2015   Physicians: Terrence Dupont Rossi/ Janie Morning, Ruta Hinds, MD/ [facility], [name], Arvella Nigh, Dorna Leitz, Clent Jacks  INTERVAL HISTORY:   Patient is seen, alone for visit, in follow up of adjuvant chemotherapy for IIIC1 grade 2 endometrial carcinoma, having had cycle 6 carbo taxol on 11-27-15, with IVF on 11-17 and granix 480 mg x 3 thru 11-30-15. She will have CT AP on 12-27-15 prior to seeing Dr Denman George on 12-30-15, then Dr Sondra Come 03-2016.  Patient tolerated last planned adjuvant chemotherapy without new or different problems. She has no peripheral neuropathy, some fatigue with exertion still, no nausea now. Taxol aches were tolerable and have resolved. Bowels are moving. She denies abdominal or pelvic pain, any bleeding, LE swelling, SOB at rest, chest pain. She has had no fever or symptoms of infection, no problems with PAC. Bowels are moving, bladder ok, no gross hematuria, no flank discomfort.  Remainder of 10 point Review of Systems negative.    PAC flushed 12-12-15; should be used and flushed with CT in Dec. Flu vaccine 10-28-15 No genetics testing, MMR IHC normal on surg path 05-02-15  ONCOLOGIC HISTORY Patient had ~ 6 months of post menopausal bleeding, at times heavy, prior to reestablishing with Dr Arvella Nigh. [place] 02-12-15 showed slightly enlarged uterus with multiple intramural fibroids up to 3.5 cm, endometrial thickness 4 mm, polypoid lesion in cavity, ovaries grossly normal. She had hysteroscopic resection of endometrial polyp, with grade 2 endometrial adenocarcinoma. She was seen by gyn oncology on 03-29-15, with uterus large without other findings of concern. She had robotic laparoscopic hysterectomy BSO with bilateral sentinel node sampling and lysis of adhesions by Dr Skeet Latch on 05-02-15. Pathology [id number]) grade 2 endometrial adenocarcinoma with 90% invasion of myometrium (1.4 cm out of 1.5 cm), no  involvement cervix or adnexa, + focal LVSI, 2 benign right sentinel nodes and no left pelvic nodes in specimen; MMR normal. Surgery was uncomplicated. CT AP 05-24-15 had mild left common iliac adenopathy up to 1.1 cm as well as 0.7 cm low aortocaval node, and a 1.6 x 1.6 cm renal mass posterior lower left kidney indeterminate. PET 06-12-15 had SUV 4.1 at the 10 mm left common iliac node, other nodes too small for PET evaluation; mildly hyperdense lesion lower pole left kidney 1.8 cm. She saw Dr Sondra Come in consultation on 06-06-15. Recommendation from surgical pathology and imaging is for chemotherapy and radiation in sandwich fashion.  First carboplatin taxol given 06-26-15, neutropenic with ANC 0.4 on day 13 cycle 1, gCSF added. She had cycle 3 chemotherapy on 08-07-15, followed by pelvic radiation and HDR thru 10-24-15. She resumed chemotherapy with cycle 4 carbo taxol on 10-16-15, 6 cycles total thru 11-27-15 with granix and IVF support.  Objective:  Vital signs in last 24 hours:  BP 140/67 (BP Location: Left Arm, Patient Position: Sitting)   Pulse 93   Temp 97.3 F (36.3 C) (Oral)   Resp 18   Ht 5' (1.524 m)   Wt 172 lb 6.4 oz (78.2 kg)   LMP 07/16/2014 Comment: pt states she seen a mild amount of blood( spotting)  SpO2 100%   BMI 33.67 kg/m  Weight up 1 lb Alert, oriented and appropriate, looks comfortable, very pleasant as always. Ambulatory without assistance.  Alopecia  HEENT:PERRL, sclerae not icteric. Oral mucosa moist without lesions, posterior pharynx clear.  Neck supple. No JVD.  Lymphatics:no cervical,supraclavicular, axillary or inguinal adenopathy Resp: clear to auscultation bilaterally  and normal percussion bilaterally Cardio: regular rate and rhythm. No gallop. GI: abdomen obese, soft, nontender, not distended, cannot appreciate mass or organomegaly. Normally active bowel sounds. Surgical incision not remarkable. Musculoskeletal/ Extremities: LE without pitting edema, cords,  tenderness Neuro: no peripheral neuropathy. Otherwise nonfocal. PSYCH appropriate mood and affect Skin without rash, ecchymosis, petechiae Breasts: without dominant mass, skin or nipple findings. Axillae benign. Portacath-without erythema or tenderness  Lab Results:  Results for orders placed or performed in visit on 12/12/15  CBC with Differential  Result Value Ref Range   WBC 2.1 (L) 3.9 - 10.3 10e3/uL   NEUT# 1.5 1.5 - 6.5 10e3/uL   HGB 9.4 (L) 11.6 - 15.9 g/dL   HCT 27.4 (L) 34.8 - 46.6 %   Platelets 84 (L) 145 - 400 10e3/uL   MCV 87.8 79.5 - 101.0 fL   MCH 30.1 25.1 - 34.0 pg   MCHC 34.3 31.5 - 36.0 g/dL   RBC 3.12 (L) 3.70 - 5.45 10e6/uL   RDW 14.4 11.2 - 14.5 %   lymph# 0.4 (L) 0.9 - 3.3 10e3/uL   MONO# 0.2 0.1 - 0.9 10e3/uL   Eosinophils Absolute 0.0 0.0 - 0.5 10e3/uL   Basophils Absolute 0.0 0.0 - 0.1 10e3/uL   NEUT% 70.4 38.4 - 76.8 %   LYMPH% 17.6 14.0 - 49.7 %   MONO% 11.0 0.0 - 14.0 %   EOS% 0.5 0.0 - 7.0 %   BASO% 0.5 0.0 - 2.0 %   nRBC 0 0 - 0 %  Comprehensive metabolic panel  Result Value Ref Range   Sodium 139 136 - 145 mEq/L   Potassium 3.1 (L) 3.5 - 5.1 mEq/L   Chloride 102 98 - 109 mEq/L   CO2 26 22 - 29 mEq/L   Glucose 100 70 - 140 mg/dl   BUN 11.7 7.0 - 26.0 mg/dL   Creatinine 0.6 0.6 - 1.1 mg/dL   Total Bilirubin 0.40 0.20 - 1.20 mg/dL   Alkaline Phosphatase 112 40 - 150 U/L   AST 23 5 - 34 U/L   ALT 25 0 - 55 U/L   Total Protein 6.8 6.4 - 8.3 g/dL   Albumin 3.4 (L) 3.5 - 5.0 g/dL   Calcium 9.4 8.4 - 10.4 mg/dL   Anion Gap 11 3 - 11 mEq/L   EGFR >90 >90 ml/min/1.73 m2  Urinalysis with microscopic  Result Value Ref Range   Glucose Negative Negative mg/dL   Bilirubin (Urine) Negative Negative   Ketones Negative Negative mg/dL   Specific Gravity, Urine 1.005 1.003 - 1.035   Blood Moderate Negative   pH 6.0 4.6 - 8.0   Protein Negative Negative- <30 mg/dL   Urobilinogen, UR 0.2 0.2 - 1 mg/dL   Nitrite Negative Negative   Leukocyte  Esterase Moderate Negative   RBC / HPF 0-2 0 - 2   WBC, UA 21-50 0 - 2   Bacteria, UA Few Negative- Trace   Epithelial Cells Occasional Negative- Few   Chemistries available after visit, see below  Studies/Results:  No results found.  Medications: I have reviewed the patient's current medications. Will increase K for next week, then continue 10 mEq daily until next visit to PCP. Avoid ASA and NSAID while platelets low  DISCUSSION Anemia, leukopenia, thrombocytopenia related to chemo and radiation, expect to improve over next several weeks. Patient knows to call if bleeding, more symptomatic anemia or infection.   She is aware of appointments for mammograms (last 06-2014), CT , Dr Denman George.  Discussed need to keep PAC flushed every 6-8 weeks when not otherwise used. Discussed removing PAC if not needed in next several months.   Mentioned question of mass in left kidney. If concerns by upcoming CT, will need referral to urology.  Encouraged her to use vaginal dilator as instructed by radiation oncology, reviewed rationale for this. She is still hesitant to use this, but will try.  I will see her in Jan to follow up counts and other issues as above. After she has recovered from chemotherapy including counts. She is aware that another medical oncologist will be working will this team in 2018.  Assessment/Plan:  1.IIIC1 grade 2 endometrioid endometrial carcinoma: 6 cycles of adjuvant carboplatin taxol given from 06-26-15 thru 11-27-15, given in sandwich fashion with radiation. Chemo was complicated by cytopenias and taxol aches.  She will have restaging scans prior to follow up with Dr Denman George 12-30-15.  I will see her with repeat labs in Jan. 2 Chemo leukopenia, anemia, thrombocytopenia .Iron deficiency related to 6+ months of vaginal bleeding, continue oral iron. Radiation to pelvis also impacting counts. Not neutropenic, not bleeding, not more symptomatic from anemia and therapy hopefully is  completed. Follow up as above. 3.questionable mass left kidney and right nephrolithiasis: UA negative for blood 07-08-15. May need urology input for left kidney after present treatment completes. Will image again on upcoming CTs 4.hypothyroidism on medication, monitored by primary care 5.obesity, BMI 35. Exercise limited by chronic problems knees 6. Cholelithiasis, not presently symptomatic 7.diverticulitis and hemorrhoids by colonoscopy 2007. She is due repeat colonoscopy, after present treatment completed when OK per gyn oncology 8.minimal past tobacco 9.Complaints of intermittent tachycardia : cardiology aware, did not tolerate Holter with skin reaction to pads. HTN, elevated lipids. Good EF 2012 10.EMR mention of OSA 11.PAC placed by IR: will need to flush every 6-8 weeks when not otherwise used. 12.Hypokalemia: likely related to diuretic. Supplement as above, f/u with labs here if not done prior by PCP.  All questions answered and patient knows to call if needed prior to next scheduled visit. Time spent 25 min including >50% counseling and coordination of care. Route PCP, cc Drs Denman George and Delena Serve, MD   12/12/2015, 2:04 PM

## 2015-12-14 ENCOUNTER — Encounter: Payer: Self-pay | Admitting: Oncology

## 2015-12-16 ENCOUNTER — Encounter: Payer: Self-pay | Admitting: Internal Medicine

## 2015-12-16 ENCOUNTER — Telehealth: Payer: Self-pay

## 2015-12-16 ENCOUNTER — Ambulatory Visit (INDEPENDENT_AMBULATORY_CARE_PROVIDER_SITE_OTHER): Payer: BLUE CROSS/BLUE SHIELD | Admitting: Internal Medicine

## 2015-12-16 VITALS — BP 145/61 | HR 104 | Temp 98.7°F | Ht 60.0 in | Wt 172.3 lb

## 2015-12-16 DIAGNOSIS — I10 Essential (primary) hypertension: Secondary | ICD-10-CM | POA: Diagnosis not present

## 2015-12-16 DIAGNOSIS — C541 Malignant neoplasm of endometrium: Secondary | ICD-10-CM

## 2015-12-16 DIAGNOSIS — R252 Cramp and spasm: Secondary | ICD-10-CM | POA: Diagnosis not present

## 2015-12-16 DIAGNOSIS — R1310 Dysphagia, unspecified: Secondary | ICD-10-CM | POA: Diagnosis not present

## 2015-12-16 DIAGNOSIS — Z923 Personal history of irradiation: Secondary | ICD-10-CM

## 2015-12-16 DIAGNOSIS — E876 Hypokalemia: Secondary | ICD-10-CM | POA: Diagnosis not present

## 2015-12-16 DIAGNOSIS — Z87891 Personal history of nicotine dependence: Secondary | ICD-10-CM

## 2015-12-16 DIAGNOSIS — Z9221 Personal history of antineoplastic chemotherapy: Secondary | ICD-10-CM

## 2015-12-16 DIAGNOSIS — R1319 Other dysphagia: Secondary | ICD-10-CM

## 2015-12-16 DIAGNOSIS — Z79899 Other long term (current) drug therapy: Secondary | ICD-10-CM

## 2015-12-16 DIAGNOSIS — Z0000 Encounter for general adult medical examination without abnormal findings: Secondary | ICD-10-CM

## 2015-12-16 MED ORDER — SPIRONOLACTONE 50 MG PO TABS: 50.0000 mg | ORAL_TABLET | Freq: Every day | ORAL | 2 refills | Status: DC

## 2015-12-16 MED ORDER — OMEPRAZOLE 20 MG PO CPDR: 20.0000 mg | DELAYED_RELEASE_CAPSULE | Freq: Every day | ORAL | 3 refills | Status: DC

## 2015-12-16 NOTE — Telephone Encounter (Signed)
Pt called stating she got message. She stated she has been having cramps real bad and she is at her internal medicine MD right now. That appt is seen in EMR as 315 pm. Did not call pt back to clarify b/c she is at IM MD.

## 2015-12-16 NOTE — Assessment & Plan Note (Signed)
Feels better since taking omeprazole. Has not had the EGD yet. Also due for cscope. Will refer her to GI again and try to get done with both.

## 2015-12-16 NOTE — Progress Notes (Signed)
   CC: HTN f/up, leg cramps  HPI:  [name] is a 60 y.o. with PMH as listed below presents for HTN f/up and leg cramps.   Past Medical History:  Diagnosis Date  . Cancer [facility])    endometrial cancer  . Endometrial polyp   . GERD (gastroesophageal reflux disease)   . Heart murmur   . History of radiation therapy   . History of uterine fibroid   . Hyperlipidemia   . Hypertension   . Hypothyroidism   . OSA (obstructive sleep apnea)    moderate OSA per study 06-21-2005--  per pt does need cpap anymore never purchased it  . Osteoarthritis   . PMB (postmenopausal bleeding)   . Wears dentures    upper  . Wears glasses    Wore heart monitor for 4 days, had to d/c it early (plan was 1 week) due to allergic reaction to the pads. No longer having palpitations.   Patient has IIIC1 grade 2 endometrial carcinom, follows at the cancer center, finished chemo and radiation, last chemo was 1 week prior to Thanksgiving. Labs 4 days ago showed K+ 3.1. Having some leg cramps.   HTN: On spironolactone 25mg  daily + amlodipine 10mg  daily. BP slightly up 145/61.   Due for cscope. Also was supposed to get EGD for esophageal dysphagia. She still has some globus sensation occasionally but omeprazole is helping.. She has not done that yet. Hopefully we can get both done together.   Review of Systems:    Review of Systems  Constitutional: Negative for chills and fever.  HENT: Negative for hearing loss.   Eyes: Negative for blurred vision.  Cardiovascular: Negative for chest pain.  Gastrointestinal: Negative for heartburn and nausea.  Musculoskeletal:       Leg cramps  Neurological: Negative for dizziness and headaches.  Psychiatric/Behavioral: Negative for depression.     Physical Exam:  Vitals:   12/16/15 1539  BP: (!) 145/61  Pulse: (!) 104  Temp: 98.7 F (37.1 C)  TempSrc: Oral  SpO2: 100%  Weight: 172 lb 4.8 oz (78.2 kg)  Height: 5' (1.524 m)   Physical Exam    Constitutional: She is oriented to person, place, and time. She appears well-developed and well-nourished. No distress.  HENT:  Head: Normocephalic and atraumatic.  Eyes: Conjunctivae are normal. No scleral icterus.  Cardiovascular: Normal rate and regular rhythm.  Exam reveals no gallop and no friction rub.   No murmur heard. Respiratory: Effort normal and breath sounds normal. No respiratory distress. She has no wheezes.  GI: Soft. Bowel sounds are normal. She exhibits no distension. There is no tenderness.  Musculoskeletal: Normal range of motion. She exhibits no edema or tenderness.  Neurological: She is alert and oriented to person, place, and time. No cranial nerve deficit. Coordination normal.  Skin: She is not diaphoretic.     Assessment & Plan:   See Encounters Tab for problem based charting.  Patient discussed with Dr. Daryll Drown

## 2015-12-16 NOTE — Telephone Encounter (Signed)
-----   Message from Gordy Levan, MD sent at 12/14/2015  3:10 PM EST ----- K a bit low at last visit, 3.1 Would increase KCL 10 mEq to 2 daily for a week, then continue 10 mEq daily. Would be good to get at least 2 servings high K food or liquid daily.  PCP should refill after next apt there.  thanks

## 2015-12-16 NOTE — Assessment & Plan Note (Addendum)
Vitals:   12/16/15 1539  BP: (!) 145/61  Pulse: (!) 104  Temp: 98.7 F (37.1 C)   BP is higher than goal . On spironolactone 25mg  daily + amlodipine 10mg  daily.   Will inc spironolactone to 50mg  daily ( this should help the hypokalemia too) and cont amlodipine 10mg  daily.   Follow up in 1 month for BP check and K+ recheck.

## 2015-12-16 NOTE — Telephone Encounter (Signed)
LVM per Dr Mariana Kaufman attached message. Some high K foods listed

## 2015-12-16 NOTE — Patient Instructions (Addendum)
Take spironolactone 50mg  daily. Increased from 25mg .  Take potassium tablet 70meq daily.  Refilled your omeprazole.  Will get you scheduled for colonoscopy.   Follow up in 1 month for BP recheck and potassium check.

## 2015-12-16 NOTE — Assessment & Plan Note (Signed)
Having some muscle cramps likely due to low K+. Recently K was 3.1 four days ago. Was asked to take 20 meq PO K+ by the Cancer center. I agree with this. Also increased spironolactone dose which should help increase the K+.   Follow up in 1 month for BP check and K+ recheck.

## 2015-12-17 ENCOUNTER — Encounter: Payer: Self-pay | Admitting: Internal Medicine

## 2015-12-17 ENCOUNTER — Telehealth: Payer: Self-pay

## 2015-12-17 NOTE — Telephone Encounter (Signed)
Please call pt back regarding omeprazole.

## 2015-12-17 NOTE — Telephone Encounter (Signed)
Wants to know why one med is free and the other is $19.00. Instructed to call ins co or pharmacist for explanation

## 2015-12-22 ENCOUNTER — Other Ambulatory Visit: Payer: Self-pay

## 2015-12-22 DIAGNOSIS — D509 Iron deficiency anemia, unspecified: Secondary | ICD-10-CM

## 2015-12-22 MED ORDER — FERROUS FUMARATE 324 (106 FE) MG PO TABS: 1.0000 | ORAL_TABLET | Freq: Every day | ORAL | 3 refills | Status: DC

## 2015-12-23 ENCOUNTER — Ambulatory Visit
Admission: RE | Admit: 2015-12-23 | Discharge: 2015-12-23 | Disposition: A | Discharge status: Home / Self Care | Payer: BLUE CROSS/BLUE SHIELD | Source: Ambulatory Visit | Attending: Oncology | Admitting: Oncology

## 2015-12-23 DIAGNOSIS — Z1239 Encounter for other screening for malignant neoplasm of breast: Secondary | ICD-10-CM

## 2015-12-23 DIAGNOSIS — Z1231 Encounter for screening mammogram for malignant neoplasm of breast: Secondary | ICD-10-CM | POA: Diagnosis not present

## 2015-12-24 ENCOUNTER — Other Ambulatory Visit: Payer: Self-pay | Admitting: Oncology

## 2015-12-24 DIAGNOSIS — R928 Other abnormal and inconclusive findings on diagnostic imaging of breast: Secondary | ICD-10-CM

## 2015-12-24 NOTE — Progress Notes (Signed)
Internal Medicine Clinic Attending  Case discussed with Dr. Ahmed soon after the resident saw the patient.  We reviewed the resident's history and exam and pertinent patient test results.  I agree with the assessment, diagnosis, and plan of care documented in the resident's note. 

## 2015-12-26 ENCOUNTER — Ambulatory Visit
Admission: RE | Admit: 2015-12-26 | Discharge: 2015-12-26 | Disposition: A | Discharge status: Home / Self Care | Payer: BLUE CROSS/BLUE SHIELD | Source: Ambulatory Visit | Attending: Oncology | Admitting: Oncology

## 2015-12-26 DIAGNOSIS — R928 Other abnormal and inconclusive findings on diagnostic imaging of breast: Secondary | ICD-10-CM | POA: Diagnosis not present

## 2015-12-27 ENCOUNTER — Ambulatory Visit (HOSPITAL_COMMUNITY)
Admission: RE | Admit: 2015-12-27 | Discharge: 2015-12-27 | Disposition: A | Discharge status: Home / Self Care | Payer: BLUE CROSS/BLUE SHIELD | Source: Ambulatory Visit | Attending: Oncology | Admitting: Oncology

## 2015-12-27 ENCOUNTER — Encounter (HOSPITAL_COMMUNITY): Payer: Self-pay

## 2015-12-27 DIAGNOSIS — Z90710 Acquired absence of both cervix and uterus: Secondary | ICD-10-CM | POA: Insufficient documentation

## 2015-12-27 DIAGNOSIS — M47816 Spondylosis without myelopathy or radiculopathy, lumbar region: Secondary | ICD-10-CM | POA: Diagnosis not present

## 2015-12-27 DIAGNOSIS — C541 Malignant neoplasm of endometrium: Secondary | ICD-10-CM | POA: Insufficient documentation

## 2015-12-27 DIAGNOSIS — N2889 Other specified disorders of kidney and ureter: Secondary | ICD-10-CM | POA: Insufficient documentation

## 2015-12-27 DIAGNOSIS — K8020 Calculus of gallbladder without cholecystitis without obstruction: Secondary | ICD-10-CM | POA: Diagnosis not present

## 2015-12-27 DIAGNOSIS — K5730 Diverticulosis of large intestine without perforation or abscess without bleeding: Secondary | ICD-10-CM | POA: Diagnosis not present

## 2015-12-27 DIAGNOSIS — I7090 Unspecified atherosclerosis: Secondary | ICD-10-CM | POA: Diagnosis not present

## 2015-12-27 MED ORDER — IOPAMIDOL (ISOVUE-300) INJECTION 61%
100.0000 mL | Freq: Once | INTRAVENOUS | Status: AC | PRN
  Administered 2015-12-27: 100 mL via INTRAVENOUS

## 2015-12-30 ENCOUNTER — Encounter: Payer: Self-pay | Admitting: Gynecologic Oncology

## 2015-12-30 ENCOUNTER — Other Ambulatory Visit: Payer: Self-pay | Admitting: Oncology

## 2015-12-30 ENCOUNTER — Ambulatory Visit: Payer: BLUE CROSS/BLUE SHIELD | Attending: Gynecologic Oncology | Admitting: Gynecologic Oncology

## 2015-12-30 ENCOUNTER — Telehealth: Payer: Self-pay | Admitting: Nurse Practitioner

## 2015-12-30 ENCOUNTER — Other Ambulatory Visit (HOSPITAL_BASED_OUTPATIENT_CLINIC_OR_DEPARTMENT_OTHER): Payer: BLUE CROSS/BLUE SHIELD

## 2015-12-30 VITALS — BP 133/74 | HR 18 | Temp 98.1°F | Resp 18 | Ht 60.0 in | Wt 168.5 lb

## 2015-12-30 DIAGNOSIS — I10 Essential (primary) hypertension: Secondary | ICD-10-CM | POA: Insufficient documentation

## 2015-12-30 DIAGNOSIS — Z79899 Other long term (current) drug therapy: Secondary | ICD-10-CM | POA: Insufficient documentation

## 2015-12-30 DIAGNOSIS — Z8249 Family history of ischemic heart disease and other diseases of the circulatory system: Secondary | ICD-10-CM | POA: Diagnosis not present

## 2015-12-30 DIAGNOSIS — N2889 Other specified disorders of kidney and ureter: Secondary | ICD-10-CM | POA: Diagnosis not present

## 2015-12-30 DIAGNOSIS — E876 Hypokalemia: Secondary | ICD-10-CM

## 2015-12-30 DIAGNOSIS — E785 Hyperlipidemia, unspecified: Secondary | ICD-10-CM | POA: Diagnosis not present

## 2015-12-30 DIAGNOSIS — C541 Malignant neoplasm of endometrium: Secondary | ICD-10-CM | POA: Diagnosis not present

## 2015-12-30 DIAGNOSIS — Z90722 Acquired absence of ovaries, bilateral: Secondary | ICD-10-CM | POA: Diagnosis not present

## 2015-12-30 DIAGNOSIS — K219 Gastro-esophageal reflux disease without esophagitis: Secondary | ICD-10-CM | POA: Diagnosis not present

## 2015-12-30 DIAGNOSIS — Z888 Allergy status to other drugs, medicaments and biological substances status: Secondary | ICD-10-CM | POA: Insufficient documentation

## 2015-12-30 DIAGNOSIS — Z90710 Acquired absence of both cervix and uterus: Secondary | ICD-10-CM | POA: Diagnosis not present

## 2015-12-30 DIAGNOSIS — M1990 Unspecified osteoarthritis, unspecified site: Secondary | ICD-10-CM | POA: Diagnosis not present

## 2015-12-30 DIAGNOSIS — E039 Hypothyroidism, unspecified: Secondary | ICD-10-CM | POA: Diagnosis not present

## 2015-12-30 DIAGNOSIS — Z9221 Personal history of antineoplastic chemotherapy: Secondary | ICD-10-CM | POA: Insufficient documentation

## 2015-12-30 DIAGNOSIS — G4733 Obstructive sleep apnea (adult) (pediatric): Secondary | ICD-10-CM | POA: Insufficient documentation

## 2015-12-30 DIAGNOSIS — Z87891 Personal history of nicotine dependence: Secondary | ICD-10-CM | POA: Diagnosis not present

## 2015-12-30 DIAGNOSIS — Z923 Personal history of irradiation: Secondary | ICD-10-CM | POA: Diagnosis not present

## 2015-12-30 LAB — BASIC METABOLIC PANEL
ANION GAP: 14 meq/L — AB (ref 3–11)
BUN: 20.6 mg/dL (ref 7.0–26.0)
CO2: 23 mEq/L (ref 22–29)
Calcium: 10.3 mg/dL (ref 8.4–10.4)
Chloride: 105 mEq/L (ref 98–109)
Creatinine: 0.7 mg/dL (ref 0.6–1.1)
Glucose: 99 mg/dl (ref 70–140)
POTASSIUM: 3.8 meq/L (ref 3.5–5.1)
Sodium: 141 mEq/L (ref 136–145)

## 2015-12-30 NOTE — Progress Notes (Signed)
ENDOMETRIAL CANCER FOLLOW-UP VISIT  Assessment:    60 y.o. year old with clinical stage IIIC Grade 2 endometrioid endometrial cancer.   S/p robotic assisted total hysterectomy, BSO, sentinel lymph node biopsy on 05/02/15 with post-op imaging suggestive of involved lymph nodes. S/p chemoradiation with carboplatin and paclitaxel x 6 cycles and sandwich external beam pelvic radiation (completed 10/24/15) with chemotherapy completed 11/27/15.  Complete clinical response. Plan: 1) Imaging results reviewed today  2) Treatment counseling -discussed that she is disease free on evaluation. Recommend surveillance with exam in 1months.  3)  Left renal lesion - suspicious for malignancy. Referral made to [facility].  4)  follow-up: Dr Sondra Come to see her in 3 months and Dr Skeet Latch to see her in 34months.   HPI:  Lydia Walsh is a 60 y.o. year old G3P3. initially seen in consultation on 03/29/15 referred by Dr Radene Knee for grade 2 endometrial cancer.  She then underwent a robotic assisted total hysterectomy, BSO, sentinel lymph node biopsy with Dr Skeet Latch on [id number] without complications.  Her postoperative course was uncomplicated.  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  Interval Hx:   Postoperative imaging on 05/24/15 showed mild left comon iliac adenopathy. This region had not been sampled surgically. On PET scan on 06/12/15 there was increased uptake at the left common iliac node consistent with metastastatic disease. A 1.8cm left lower pole renal lesion was also identified.  She was diagnosed with clinical stage IIIC disease and was prescribed sandwiched chemotherapy and radiation. She received 6 cycles of carboplatin and paclitaxel between 06/26/15 and 11/27/15 with whole pelvic external beam  radiation administered after 3 cycles from July through 10/24/15.   She tolerated treatment well.   On post-treatment CT abdo/pelvis on 12/27/15 showed: No residual lymphadenopathy or other evidence of metastatic endometrial carcinoma. 2. Persistent hypodense mass involving the lower pole of the left kidney, likely enhancing as correlated with previous PET-CT. This remains worrisome for renal cell carcinoma.   Past Medical History:  Diagnosis Date  . Cancer [facility]    endometrial cancer  . Endometrial polyp   . GERD (gastroesophageal reflux disease)   . Heart murmur   . History of radiation therapy   . History of uterine fibroid   . Hyperlipidemia   . Hypertension   . Hypothyroidism   . OSA (obstructive sleep apnea)    moderate OSA per study 06-21-2005--  per pt does need cpap anymore never purchased it  . Osteoarthritis   . PMB (postmenopausal bleeding)   . Wears dentures    upper  . Wears glasses    Past Surgical History:  Procedure Laterality Date  . DILATATION & CURETTAGE/HYSTEROSCOPY WITH MYOSURE N/A 03/07/2015   Procedure: DILATATION & CURETTAGE/HYSTEROSCOPY WITH MYOSURE (POLYP);  Surgeon: Arvella Nigh, MD;  Location: [facility];  Service: Gynecology;  Laterality: N/A;  . DILATATION & CURRETTAGE/HYSTEROSCOPY WITH RESECTOCOPE  12-13-2002  &  09-26-2004   polyp/  submucosal fibroid  . EXCISION BENIGN RIGHT BREAST MASS  age 59  . KNEE ARTHROSCOPY W/ MENISCECTOMY Bilateral right 04-12-2006//  left 12-29-2006   and Chondroplasty  . ROBOTIC ASSISTED TOTAL HYSTERECTOMY WITH BILATERAL SALPINGO OOPHERECTOMY Bilateral 05/02/2015   Procedure: XI ROBOTIC ASSISTED TOTAL HYSTERECTOMY WITH BILATERAL SALPINGO OOPHORECTOMY AND SENTINAL LYMPH NODE BIOPSY;  Surgeon: Janie Morning, MD;  Location: [facility];  Service: Gynecology;  Laterality: Bilateral;  . TRANSTHORACIC ECHOCARDIOGRAM  04-02-2010   normal LV, ef 65%  . TUBAL LIGATION  1980's   Family History  Problem Relation Age  of Onset  . Hypertension Mother    Social History   Social History  . Marital status: Widowed    Spouse name: N/A  . Number of children: 3  . Years of education: N/A   Occupational History  . security    Social History Main Topics  . Smoking status: Former Smoker    Years: 2.00    Types: Cigarettes    Quit date: 02/28/1994  . Smokeless tobacco: Never Used  . Alcohol use No  . Drug use: No  . Sexual activity: No   Other Topics Concern  . Not on file   Social History Narrative   Lives with her son.  Perimenopausal.   Allergies  Allergen Reactions  . Hctz [Hydrochlorothiazide] Rash   Current Outpatient Prescriptions on File Prior to Visit  Medication Sig Dispense Refill  . allopurinol (ZYLOPRIM) 100 MG tablet TAKE ONE TABLET BY MOUTH ONCE DAILY 90 tablet 1  . amLODipine (NORVASC) 10 MG tablet Take 1 tablet (10 mg total) by mouth daily. 90 tablet 3  . Ferrous Fumarate (HEMOCYTE - 106 MG FE) 324 (106 Fe) MG TABS tablet Take 1 tablet (106 mg of iron total) by mouth daily. On an empty stomach with OJ 30 tablet 3  . levothyroxine (SYNTHROID, LEVOTHROID) 125 MCG tablet Take 1 tablet (125 mcg total) by mouth daily before breakfast. 90 tablet 3  . lidocaine-prilocaine (EMLA) cream Apply to port 1-2 hours before accessing with a needle. Cover with plastic wrap. 30 g 1  . potassium chloride (K-DUR) 10 MEQ tablet Take 10 mEq by mouth daily.    . simvastatin (ZOCOR) 20 MG tablet Take 1 tablet (20 mg total) by mouth every evening. 30 tablet 11  . spironolactone (ALDACTONE) 50 MG tablet Take 1 tablet (50 mg total) by mouth daily. 30 tablet 2  . HYDROcodone-acetaminophen (NORCO/VICODIN) 5-325 MG tablet Take 1 tablet by mouth every 4 (four) hours as needed for severe pain. (Patient not taking: Reported on 12/30/2015) 10 tablet 0  . ibuprofen (ADVIL,MOTRIN) 200 MG tablet Take 200 mg by mouth every 6 (six) hours as needed for mild pain. Reported on 07/15/2015    . LORazepam (ATIVAN) 0.5 MG  tablet Place 1 tablet under the tongue or swallow every 6 hours as needed for nausea. Will make sleepy. (Patient not taking: Reported on 12/30/2015) 20 tablet 0  . Melatonin 3 MG TABS Take 1 tablet by mouth at bedtime as needed.    Marland Kitchen omeprazole (PRILOSEC) 20 MG capsule Take 1 capsule (20 mg total) by mouth daily. (Patient not taking: Reported on 12/30/2015) 30 capsule 3  . ondansetron (ZOFRAN) 8 MG tablet Take 1 tablet (8 mg total) by mouth every 8 (eight) hours as needed for nausea. Will make drowsy. (Patient not taking: Reported on 12/30/2015) 30 tablet 1  . [DISCONTINUED] esomeprazole (NEXIUM) 20 MG capsule Take 2 capsules (40 mg total) by mouth daily before breakfast. 30 capsule 3   Current Facility-Administered Medications on File Prior to Visit  Medication Dose Route Frequency Provider Last Rate Last Dose  . sodium chloride 0.9 % injection 10 mL  10 mL Intravenous Once Gery Pray, MD         Review of systems: Constitutional:  She has no weight gain or weight loss. She has no fever  or chills. Eyes: No blurred vision Ears, Nose, Mouth, Throat: No dizziness, headaches or changes in hearing. No mouth sores. Cardiovascular: No chest pain, palpitations or edema. Respiratory:  No shortness of breath, wheezing or cough Gastrointestinal: She has normal bowel movements without diarrhea or constipation. She denies any nausea or vomiting. She denies blood in her stool or heart burn. Genitourinary:  She denies pelvic pain, pelvic pressure or changes in her urinary function. She has no hematuria, dysuria, or incontinence. She has no irregular vaginal bleeding or vaginal discharge Musculoskeletal: Denies muscle weakness or joint pains.  Skin:  She has no skin changes, rashes or itching Neurological:  Denies dizziness or headaches. No neuropathy, no numbness or tingling. Psychiatric:  She denies depression or anxiety. Hematologic/Lymphatic:   No easy bruising or bleeding   Physical Exam: Blood  pressure 133/74, pulse (!) 18, temperature 98.1 F (36.7 C), temperature source Oral, resp. rate 18, height 5' (1.524 m), weight 168 lb 8 oz (76.4 kg), last menstrual period 07/16/2014, SpO2 100 %. General: Well dressed, well nourished in no apparent distress.   HEENT:  Normocephalic and atraumatic, no lesions.  Extraocular muscles intact. Sclerae anicteric. Pupils equal, round, reactive. No mouth sores or ulcers. Thyroid is normal size, not nodular, midline. Lungs:  Clear to auscultation bilaterally.  No wheezes. Cardiovascular:  Regular rate and rhythm.  No murmurs or rubs. Abdomen:  Soft, nontender, nondistended.  No palpable masses.  No hepatosplenomegaly.  No ascites. Normal bowel sounds.  No hernias.  Incisions are well healed Genitourinary: Normal EGBUS  Vaginal cuff intact.  No bleeding or discharge.  No cul de sac fullness. Extremities: No cyanosis, clubbing or edema.  No calf tenderness or erythema. No palpable cords. Psychiatric: Mood and affect are appropriate. Neurological: Awake, alert and oriented x 3. Sensation is intact, no neuropathy.  Musculoskeletal: No pain, normal strength and range of motion.  Donaciano Eva, MD

## 2015-12-30 NOTE — Patient Instructions (Signed)
You have an appointment to see Dr. Sondra Come at the end of March. Dr. Skeet Latch will see you in this office on Thursday May 21, 2016 at 12:00 pm. We will call you with the results of your lab draw today. Please call our clinic with any new questions or concerns.

## 2015-12-30 NOTE — Telephone Encounter (Signed)
Per Dr. Marko Plume, Rn called patient to inform her that Lydia Walsh is normal today at 3.8 and she should continue current dose of aldactone per PCP and take one 10 mEq tablet of Kdur daily. She verbalizes understanding and knows to call clinic at any time with any questions or concerns.

## 2016-01-03 ENCOUNTER — Telehealth: Payer: Self-pay

## 2016-01-03 NOTE — Telephone Encounter (Signed)
potassium chloride (K-DUR) 10 MEQ tablet, refill request @ [facility] on [address].

## 2016-01-03 NOTE — Telephone Encounter (Signed)
S/w pt that per Dr Marko Plume potassium is good and she is to continue taking 10 meq daily. Pt is agreeable.

## 2016-01-07 MED ORDER — POTASSIUM CHLORIDE ER 10 MEQ PO TBCR: 10.0000 meq | EXTENDED_RELEASE_TABLET | Freq: Every day | ORAL | 0 refills | Status: DC

## 2016-01-17 ENCOUNTER — Ambulatory Visit: Payer: BLUE CROSS/BLUE SHIELD | Admitting: Cardiovascular Disease

## 2016-01-22 ENCOUNTER — Ambulatory Visit: Payer: BLUE CROSS/BLUE SHIELD | Admitting: Cardiovascular Disease

## 2016-01-23 ENCOUNTER — Encounter: Payer: Self-pay | Admitting: Oncology

## 2016-01-23 ENCOUNTER — Ambulatory Visit: Payer: BLUE CROSS/BLUE SHIELD

## 2016-01-23 ENCOUNTER — Other Ambulatory Visit: Payer: Self-pay | Admitting: Oncology

## 2016-01-23 ENCOUNTER — Ambulatory Visit (HOSPITAL_BASED_OUTPATIENT_CLINIC_OR_DEPARTMENT_OTHER): Payer: BLUE CROSS/BLUE SHIELD | Admitting: Oncology

## 2016-01-23 ENCOUNTER — Other Ambulatory Visit (HOSPITAL_BASED_OUTPATIENT_CLINIC_OR_DEPARTMENT_OTHER): Payer: BLUE CROSS/BLUE SHIELD

## 2016-01-23 VITALS — BP 119/72 | HR 108 | Temp 98.1°F | Resp 18 | Ht 60.0 in | Wt 164.7 lb

## 2016-01-23 DIAGNOSIS — C541 Malignant neoplasm of endometrium: Secondary | ICD-10-CM

## 2016-01-23 DIAGNOSIS — D701 Agranulocytosis secondary to cancer chemotherapy: Secondary | ICD-10-CM

## 2016-01-23 DIAGNOSIS — N2889 Other specified disorders of kidney and ureter: Secondary | ICD-10-CM | POA: Diagnosis not present

## 2016-01-23 DIAGNOSIS — D6481 Anemia due to antineoplastic chemotherapy: Secondary | ICD-10-CM | POA: Diagnosis not present

## 2016-01-23 DIAGNOSIS — E039 Hypothyroidism, unspecified: Secondary | ICD-10-CM

## 2016-01-23 DIAGNOSIS — D6959 Other secondary thrombocytopenia: Secondary | ICD-10-CM

## 2016-01-23 DIAGNOSIS — M67912 Unspecified disorder of synovium and tendon, left shoulder: Secondary | ICD-10-CM

## 2016-01-23 DIAGNOSIS — D500 Iron deficiency anemia secondary to blood loss (chronic): Secondary | ICD-10-CM

## 2016-01-23 DIAGNOSIS — Z95828 Presence of other vascular implants and grafts: Secondary | ICD-10-CM

## 2016-01-23 DIAGNOSIS — E669 Obesity, unspecified: Secondary | ICD-10-CM

## 2016-01-23 DIAGNOSIS — Z6835 Body mass index (BMI) 35.0-35.9, adult: Secondary | ICD-10-CM

## 2016-01-23 DIAGNOSIS — I10 Essential (primary) hypertension: Secondary | ICD-10-CM

## 2016-01-23 LAB — CBC WITH DIFFERENTIAL/PLATELET
BASO%: 0.5 % (ref 0.0–2.0)
BASOS ABS: 0 10*3/uL (ref 0.0–0.1)
EOS ABS: 0.1 10*3/uL (ref 0.0–0.5)
EOS%: 2 % (ref 0.0–7.0)
HEMATOCRIT: 33 % — AB (ref 34.8–46.6)
HGB: 11.1 g/dL — ABNORMAL LOW (ref 11.6–15.9)
LYMPH%: 14.1 % (ref 14.0–49.7)
MCH: 30.5 pg (ref 25.1–34.0)
MCHC: 33.6 g/dL (ref 31.5–36.0)
MCV: 90.7 fL (ref 79.5–101.0)
MONO#: 0.3 10*3/uL (ref 0.1–0.9)
MONO%: 6.3 % (ref 0.0–14.0)
NEUT%: 77.1 % — ABNORMAL HIGH (ref 38.4–76.8)
NEUTROS ABS: 3.4 10*3/uL (ref 1.5–6.5)
NRBC: 0 % (ref 0–0)
PLATELETS: 182 10*3/uL (ref 145–400)
RBC: 3.64 10*6/uL — AB (ref 3.70–5.45)
RDW: 13.7 % (ref 11.2–14.5)
WBC: 4.4 10*3/uL (ref 3.9–10.3)
lymph#: 0.6 10*3/uL — ABNORMAL LOW (ref 0.9–3.3)

## 2016-01-23 LAB — COMPREHENSIVE METABOLIC PANEL
ALT: 18 U/L (ref 0–55)
ANION GAP: 10 meq/L (ref 3–11)
AST: 17 U/L (ref 5–34)
Albumin: 3.8 g/dL (ref 3.5–5.0)
Alkaline Phosphatase: 88 U/L (ref 40–150)
BILIRUBIN TOTAL: 0.57 mg/dL (ref 0.20–1.20)
BUN: 18.6 mg/dL (ref 7.0–26.0)
CALCIUM: 9.9 mg/dL (ref 8.4–10.4)
CO2: 25 mEq/L (ref 22–29)
CREATININE: 0.8 mg/dL (ref 0.6–1.1)
Chloride: 106 mEq/L (ref 98–109)
Glucose: 121 mg/dl (ref 70–140)
Potassium: 3.9 mEq/L (ref 3.5–5.1)
Sodium: 141 mEq/L (ref 136–145)
TOTAL PROTEIN: 7.1 g/dL (ref 6.4–8.3)

## 2016-01-23 MED ORDER — SODIUM CHLORIDE 0.9% FLUSH
10.0000 mL | INTRAVENOUS | Status: DC | PRN
  Administered 2016-01-23: 10 mL via INTRAVENOUS
  Filled 2016-01-23: qty 10

## 2016-01-23 MED ORDER — HEPARIN SOD (PORK) LOCK FLUSH 100 UNIT/ML IV SOLN
500.0000 [IU] | Freq: Once | INTRAVENOUS | Status: AC
  Administered 2016-01-23: 500 [IU] via INTRAVENOUS
  Filled 2016-01-23: qty 5

## 2016-01-23 NOTE — Progress Notes (Signed)
OFFICE PROGRESS NOTE   January 23, 2016   Physicians: Terrence Dupont Rossi/ Janie Morning, Ruta Hinds, MD/ [facility], [name], Arvella Nigh, Dorna Leitz, Clent Jacks Dr Alexis Frock - new patient consult 02-17-16  INTERVAL HISTORY:  Patient is seen, alone for visit, on observation for IIIC grade 2 endometrial carcinoma since completing 6 cycles of adjuvant carboplatin taxol on 11-27-15. CT AP 12-26-16 had no findings of concern from standpoint of the gyn carcinoma, but unchanged hypodense mass lower left kidney 1.5 x 1.4 cm.  She saw Dr Denman George on 12-30-15 and will see Dr Skeet Latch next 05-2016. New patient consultation Dr Clint Lipps 02-17-16. Dr Sondra Come [phone]  Patient has felt very well, with no concerns that seem referable to the endometrial cancer. Peripheral neuropathy has progressively improved, now only noticeable at metacarpophanangeal joints and not bothersome. She has had no problems with PAC, which she prefers to keep for now as peripheral IV access is very difficuly. Appetite and energy are good. Bowels are moving regularly, no abdominal or pelvic discomfort, no bleeding including no gross hematuria. No SOB or other respiratory symptoms. No flank pain. No LE swelling. No fever or symptoms of infection. She has limited ROM and some pain at left shoulder when she reaches behind back or elevates at shoulder, no trauma, no swelling in arm. Remainder of 14 point Review of Systems negative.    PAC flushed 01-23-16 Flu vaccine 10-28-15 No genetics testing, MMR IHC normal on surg path 05-02-15  ONCOLOGIC HISTORY Patient had ~ 6 months of post menopausal bleeding, at times heavy, prior to reestablishing with Dr Arvella Nigh. [place] 02-12-15 showed slightly enlarged uterus with multiple intramural fibroids up to 3.5 cm, endometrial thickness 4 mm, polypoid lesion in cavity, ovaries grossly normal. She had hysteroscopic resection of endometrial polyp, with grade 2 endometrial  adenocarcinoma. She was seen by gyn oncology on 03-29-15, with uterus large without other findings of concern. She had robotic laparoscopic hysterectomy BSO with bilateral sentinel node sampling and lysis of adhesions by Dr Skeet Latch on 05-02-15. Pathology [phone]) grade 2 endometrial adenocarcinoma with 90% invasion of myometrium (1.4 cm out of 1.5 cm), no involvement cervix or adnexa, + focal LVSI, 2 benign right sentinel nodes and no left pelvic nodes in specimen; MMR normal. Surgery was uncomplicated. CT AP 05-24-15 had mild left common iliac adenopathy up to 1.1 cm as well as 0.7 cm low aortocaval node, and a 1.6 x 1.6 cm renal mass posterior lower left kidney indeterminate. PET 06-12-15 had SUV 4.1 at the 10 mm left common iliac node, other nodes too small for PET evaluation; mildly hyperdense lesion lower pole left kidney 1.8 cm. She saw Dr Sondra Come in consultation on 06-06-15. Recommendation from surgical pathology and imaging is for chemotherapy and radiation in sandwich fashion.  First carboplatin taxol given 06-26-15, neutropenic with ANC 0.4 on day 13 cycle 1, gCSF added. She had cycle 3 chemotherapy on 08-07-15, followed by pelvic radiation and HDR thru 10-24-15. She resumed chemotherapy with cycle 4 carbo taxol on 10-16-15, 6 cycles total thru 11-27-15 with granix and IVF support. Restaging CT AP 12-26-16 NED from standpoint of the gyn malignancy, with left renal lesion to be evaluated. .  Objective:  Vital signs in last 24 hours:  BP 119/72 (BP Location: Left Arm, Patient Position: Sitting)   Pulse (!) 108   Temp 98.1 F (36.7 C) (Oral)   Resp 18   Ht 5' (1.524 m)   Wt 164 lb 11.2 oz (74.7 kg)  LMP 07/16/2014   SpO2 100%   BMI 32.17 kg/m  Weight down 4 lbs. Alert, oriented and appropriate. Ambulatory without difficulty.  Partial alopecia  HEENT:PERRL, sclerae not icteric. Oral mucosa moist without lesions, posterior pharynx clear.  Neck supple. No JVD.  Lymphatics:no  cervical,supraclavicular, axillary or inguinal adenopathy Resp: clear to auscultation bilaterally and normal percussion bilaterally Cardio: regular rate and rhythm. No gallop. GI: abdomen obese, soft, nontender, cannot appreciate mass or organomegaly. Normally active bowel sounds. Surgical incisions not remarkable. Musculoskeletal/ Extremities: LE without pitting edema, cords, tenderness. Unable to elevate LUE at shoulder to 90 degrees, unable to position left arm behind back. No swelling or erythema LUE Neuro: minimal residual peripheral neuropathy metacarpophalangeal heads bilaterally. Otherwise nonfocal. PSYCH appropriate mood and affect Skin without rash, ecchymosis, petechiae Portacath-without erythema or tenderness  Lab Results:  Results for orders placed or performed in visit on 01/23/16  CBC with Differential  Result Value Ref Range   WBC 4.4 3.9 - 10.3 10e3/uL   NEUT# 3.4 1.5 - 6.5 10e3/uL   HGB 11.1 (L) 11.6 - 15.9 g/dL   HCT 33.0 (L) 34.8 - 46.6 %   Platelets 182 145 - 400 10e3/uL   MCV 90.7 79.5 - 101.0 fL   MCH 30.5 25.1 - 34.0 pg   MCHC 33.6 31.5 - 36.0 g/dL   RBC 3.64 (L) 3.70 - 5.45 10e6/uL   RDW 13.7 11.2 - 14.5 %   lymph# 0.6 (L) 0.9 - 3.3 10e3/uL   MONO# 0.3 0.1 - 0.9 10e3/uL   Eosinophils Absolute 0.1 0.0 - 0.5 10e3/uL   Basophils Absolute 0.0 0.0 - 0.1 10e3/uL   NEUT% 77.1 (H) 38.4 - 76.8 %   LYMPH% 14.1 14.0 - 49.7 %   MONO% 6.3 0.0 - 14.0 %   EOS% 2.0 0.0 - 7.0 %   BASO% 0.5 0.0 - 2.0 %   nRBC 0 0 - 0 %  Comprehensive metabolic panel  Result Value Ref Range   Sodium 141 136 - 145 mEq/L   Potassium 3.9 3.5 - 5.1 mEq/L   Chloride 106 98 - 109 mEq/L   CO2 25 22 - 29 mEq/L   Glucose 121 70 - 140 mg/dl   BUN 18.6 7.0 - 26.0 mg/dL   Creatinine 0.8 0.6 - 1.1 mg/dL   Total Bilirubin 0.57 0.20 - 1.20 mg/dL   Alkaline Phosphatase 88 40 - 150 U/L   AST 17 5 - 34 U/L   ALT 18 0 - 55 U/L   Total Protein 7.1 6.4 - 8.3 g/dL   Albumin 3.8 3.5 - 5.0 g/dL    Calcium 9.9 8.4 - 10.4 mg/dL   Anion Gap 10 3 - 11 mEq/L   EGFR >90 >90 ml/min/1.73 m2   Hgb up from  9.4 in late Nov.  Studies/Results: EXAM: CT ABDOMEN AND PELVIS WITH CONTRAST  12-26-16  COMPARISON:  Abdominopelvic CT 05/24/2015.  PET-CT 06/12/2015.  FINDINGS: Lower chest: Clear lung bases. There is no pleural or pericardial effusion. Central venous catheter tip in the upper right atrium. There is atherosclerosis of the thoracic aorta and coronary arteries.  Hepatobiliary: The liver appears stable without suspicious finding. There is no biliary dilatation or gallbladder wall thickening. Multiple noncalcified gallstones are present within the gallbladder lumen.  Pancreas: Mildly atrophied without focal abnormality or surrounding inflammation.  Spleen: Normal in size without focal abnormality.  Small splenule.  Adrenals/Urinary Tract: Both adrenal glands appear normal. There is stable cortical scarring in the upper pole the right  kidney. Persistent hypodense mass involving the lower pole the left kidney, measuring approximately 15 x 14 mm on image 35. This measures 68 HU on the immediate postcontrast images and 63 HU on the delayed images. This lesion remains concerning for renal cell carcinoma. There is a tiny cyst anteriorly in the mid left kidney. No hydronephrosis. The bladder is nearly empty without apparent abnormality.  Stomach/Bowel: No evidence of bowel wall thickening, distention or surrounding inflammatory change. There are diverticular changes throughout the distal colon, greatest in the sigmoid. No evidence of active inflammation. The appendix appears normal.  Vascular/Lymphatic: There are no residual enlarged abdominopelvic lymph nodes. Stable aortic and branch vessel atherosclerosis.  Reproductive: Hysterectomy. Trace free pelvic fluid. No evidence of adnexal mass.  Other: No generalized ascites or peritoneal nodularity. Stable  small umbilical hernia containing only fat.  Musculoskeletal: No acute or significant osseous findings. Multilevel spondylosis again noted.  IMPRESSION: 1. No residual lymphadenopathy or other evidence of metastatic endometrial carcinoma. 2. Persistent hypodense mass involving the lower pole of the left kidney, likely enhancing as correlated with previous PET-CT. This remains worrisome for renal cell carcinoma. Again, renal mass protocol MRI abdomen without and with contrast recommended. 3. Stable incidental findings including cholelithiasis, atherosclerosis, distal colonic diverticulosis and lumbar spondylosis.  PACs images reviewed by MD     2D DIGITAL DIAGNOSTIC UNILATERAL LEFT MAMMOGRAM WITH CAD AND ADJUNCT    12-26-15 TOMO  COMPARISON:  12/23/2015 and earlier priors  ACR Breast Density Category b: There are scattered areas of fibroglandular density.  FINDINGS: CC and MLO whole breast views including tomography show no mass or distortion in the retroareolar left breast, in the region of concern on recent screening mammogram. The area of concern on the screening mammogram was likely due to overlapping retroareolar ducts, accentuated by the nipple not being in profile. No suspicious findings are identified in the left breast.  Mammographic images were processed with CAD.  IMPRESSION: No evidence of malignancy in the left breast.  RECOMMENDATION: Screening mammogram in one year.(Code:SM-B-01Y)  I have discussed the findings and recommendations with the patient. Results were also provided in writing at the conclusion of the visit. If applicable, a reminder letter will be sent to the patient regarding the next appointment.  BI-RADS CATEGORY  1: Negative.   DIGITAL SCREENING BILATERAL MAMMOGRAM WITH CAD  12-23-15 COMPARISON:  Previous exam(s).  ACR Breast Density Category b: There are scattered areas of fibroglandular density.  FINDINGS: In the  left breast, a possible asymmetry warrants further evaluation. In the right breast, no findings suspicious for malignancy. Images were processed with CAD.  IMPRESSION: Further evaluation is suggested for possible asymmetry in the left breast.  RECOMMENDATION: Diagnostic mammogram and possibly ultrasound of the left breast. (Code:[id number]-62M)  The patient will be contacted regarding the findings, and additional imaging will be scheduled.  BI-RADS CATEGORY  0: Incomplete. Need additional imaging evaluation and/or prior mammograms for comparison.   Medications: I have reviewed the patient's current medications.  DISCUSSION Doing well from standpoint of endometrial malignancy and progressively improving from chemotherapy. She is aware of follow up appoimtents with Drs Sondra Come and Skeet Latch.    Very difficult peripheral IV access including for blood draws, prefers to keep PAC for a bit longer. PAC will need flush every 6-8 weeks if not otherwise used. With PAC in, will ask Dr Alvy Bimler to see in 6 months.   Discussed urology referral due to left renal mass. Referral made by Dr Denman George however no appointment scheduled yet;  I have spoken directly with that office, appointment made now with Dr Tresa Moore for 02-17-16, patient aware.  Likely rotator cuff problem with left shoulder. She is due back to PCP , will discuss with that MD and may benefit from orthopedic referral.   She has been given written information on advance directives today, may want to discuss with [facility]. Message to L.Mullis.   Assessment/Plan:  1.IIIC1 grade 2 endometrioid endometrial carcinoma: on observation since hysterectomy/ BSO/ sentinel nodes 05-02-15, followed by 6 cycles of adjuvant carboplatin taxol given from 06-26-15 thru 11-27-15 in sandwich fashion with radiation. Chemo was complicated by cytopenias and taxol aches. NED by restaging scans 12-27-15. Follow up as above. 2 Chemo leukopenia, anemia, thrombocytopenia .Iron  deficiency related to 6+ months of vaginal bleeding. Counts improving, continue oral iron for now, follow.  3.apparent mass left kidney : urology consult upcoming, thank you. Right nephrolithiasis on scans 4.hypothyroidism on medication, monitored by primary care 5.obesity, BMI 35. Exercise limited by chronic problems knees 6. Cholelithiasis, not presently symptomatic 7.diverticulitis and hemorrhoids by colonoscopy 2007. She is due repeat colonoscopy, afterpresent treatment completed when OK per gyn oncology 8.minimal past tobacco 9.Complaints of intermittent tachycardia : cardiology aware, did not tolerate Holter with skin reaction to pads. HTN, elevated lipids. Good EF 2012 10.EMR mention of OSA 11.PAC placed by IR: will need to flush every 6-8 weeks when not otherwise used. 12.Hypokalemia: likely related to diuretic.K in normal range now. 13. Probably left rotator cuff problem: she will discuss with PCP, may need orthopedics. 14. Mammograms 12-2015 OK after follow up imaging on left. Due bilateral in one year.   All questions answered and patient knows to call if concerns prior to next appointment. Time spent 25 min including >50% counseling and coordination of care. Route PCP, cc Dr Tresa Moore  Evlyn Clines, MD   01/23/2016, 3:29 PM

## 2016-01-23 NOTE — Patient Instructions (Signed)

## 2016-02-03 ENCOUNTER — Encounter: Payer: Self-pay | Admitting: Internal Medicine

## 2016-02-03 ENCOUNTER — Ambulatory Visit (INDEPENDENT_AMBULATORY_CARE_PROVIDER_SITE_OTHER): Payer: BLUE CROSS/BLUE SHIELD | Admitting: Internal Medicine

## 2016-02-03 VITALS — BP 138/70 | HR 96 | Temp 98.2°F | Wt 164.8 lb

## 2016-02-03 DIAGNOSIS — M170 Bilateral primary osteoarthritis of knee: Secondary | ICD-10-CM | POA: Diagnosis not present

## 2016-02-03 DIAGNOSIS — I10 Essential (primary) hypertension: Secondary | ICD-10-CM

## 2016-02-03 DIAGNOSIS — M25561 Pain in right knee: Secondary | ICD-10-CM

## 2016-02-03 DIAGNOSIS — G8929 Other chronic pain: Secondary | ICD-10-CM | POA: Insufficient documentation

## 2016-02-03 DIAGNOSIS — M79602 Pain in left arm: Secondary | ICD-10-CM

## 2016-02-03 DIAGNOSIS — M25562 Pain in left knee: Secondary | ICD-10-CM

## 2016-02-03 DIAGNOSIS — E876 Hypokalemia: Secondary | ICD-10-CM

## 2016-02-03 DIAGNOSIS — Z87891 Personal history of nicotine dependence: Secondary | ICD-10-CM

## 2016-02-03 DIAGNOSIS — Z79899 Other long term (current) drug therapy: Secondary | ICD-10-CM

## 2016-02-03 MED ORDER — MELOXICAM 7.5 MG PO TABS: 7.5000 mg | ORAL_TABLET | Freq: Every day | ORAL | 2 refills | Status: DC

## 2016-02-03 NOTE — Progress Notes (Signed)
   CC: HTN and hypokalemia f/up.  HPI:  [name] is a 61 y.o. pmh as listed below is here for follow up of HTN and hypokalemia  Past Medical History:  Diagnosis Date  . Cancer [facility])    endometrial cancer  . Endometrial polyp   . GERD (gastroesophageal reflux disease)   . Heart murmur   . History of radiation therapy   . History of uterine fibroid   . Hyperlipidemia   . Hypertension   . Hypothyroidism   . OSA (obstructive sleep apnea)    moderate OSA per study 06-21-2005--  per pt does need cpap anymore never purchased it  . Osteoarthritis   . PMB (postmenopausal bleeding)   . Wears dentures    upper  . Wears glasses     HTN: we increased spironolactone last time, currently on amlodipine 10mg  daily + spironolactone 50mg  daily. Doing well on this.  Hypokalemia - resolved per lab work on 01/23/16 showing K 3.9. On kdur 35meq daily along with spironolactone.  Left arm pain - has pain over left arm for 1 month, feels it was caused by turning on her faucet which is usually tight and needs pressure to be turned on. Painful to move the arm, can't raise it above her head. No weakness,numbness,tingling. No elbow or hand pain. No shoulder joint pain.   B/l knee pain - has hx of moderlat tricompartmental OA changes based on last xray 2013. Used to see Dr. Berenice Primas ortho. Can't see them anymore due to left over balance. Used to get steroid injections. Requesting new ortho referral for continuation of b/l knee pain.   Review of Systems:    Review of Systems  Constitutional: Negative for chills and fever.  Respiratory: Negative for cough, sputum production and shortness of breath.   Cardiovascular: Negative for chest pain and palpitations.  Gastrointestinal: Negative for heartburn, nausea and vomiting.  Musculoskeletal: Positive for myalgias.  Neurological: Negative for dizziness and headaches.  Psychiatric/Behavioral: Negative for depression.     Physical Exam:  Vitals:   02/03/16 1317  BP: 138/70  Pulse: 96  Temp: 98.2 F (36.8 C)  TempSrc: Oral  SpO2: 100%  Weight: 164 lb 12.8 oz (74.8 kg)   Physical Exam  Constitutional: She appears well-developed and well-nourished. No distress.  HENT:  Head: Normocephalic and atraumatic.  Mouth/Throat: No oropharyngeal exudate.  Neck: Normal range of motion. Neck supple.  No tenderness over Cspine.   Cardiovascular: Normal rate and regular rhythm.  Exam reveals no friction rub.   No murmur heard. Respiratory: Effort normal and breath sounds normal. No respiratory distress. She has no wheezes.  GI: Soft. Bowel sounds are normal.  Musculoskeletal:  On left arm, has tenderness over the bicep muscle and also the coracobrachialis muscle that goes up and posteriorly. No shoulder joint tenderness. L shoulder flexion limited to 90 degrees due to pain. No signs of injury. No mass or edema.   Lymphadenopathy:    She has no cervical adenopathy.  Skin: She is not diaphoretic.    Assessment & Plan:   See Encounters Tab for problem based charting.  Patient discussed with Dr. Evette Doffing

## 2016-02-03 NOTE — Assessment & Plan Note (Addendum)
  has hx of moderlate tricompartmental OA changes based on last xray 2013. Used to see Dr. Berenice Primas ortho. Pain with walking. On exam has mild ttp over the knee joints medially and laterally on both knees. Good ROM.  Will do conservative management using NSAID for now. Will do trial of meloxicam. Has not had good relief with ibuprofen, says iburprofen at higher dose causes SOB. But has had no side effect with 200mg  dose.   -started meloxicam 7.5mg  daily.

## 2016-02-03 NOTE — Assessment & Plan Note (Addendum)
Having arm pain for 1 month, likely from using her tight faucet at home. Has tendrenss over the bicep muscle. Left shoulder flexion limited past 90 degrees to to pain. Likely has bicep tendinitis or coracobrachialis tendinitis.  Will manage conservatively with NSAID, ice, rest.  Started meloxicam 7.5mg  daily.  F/up as needed.

## 2016-02-03 NOTE — Patient Instructions (Signed)
Lets try using Meloxicam for your shoulder and knee pain.  If it gets worse then we will do further workup in the future.  Don't take iburpofen or other NSAID products while taking meloxicam.  Continue your other medications as you are.   See me in 3 months.

## 2016-02-04 NOTE — Assessment & Plan Note (Signed)
HypoK+ resolved on labs 01/23/16 after increasing spironolactone, also on kdur 73meqdaily.  Cont this.

## 2016-02-04 NOTE — Assessment & Plan Note (Signed)
Vitals:   02/03/16 1317  BP: 138/70  Pulse: 96  Temp: 98.2 F (36.8 C)    currently on amlodipine 10mg  daily + spironolactone 50mg  daily. Doing well on this. Cont this.

## 2016-02-04 NOTE — Progress Notes (Signed)
Internal Medicine Clinic Attending  Case discussed with Dr. Ahmed at the time of the visit.  We reviewed the resident's history and exam and pertinent patient test results.  I agree with the assessment, diagnosis, and plan of care documented in the resident's note. 

## 2016-02-10 ENCOUNTER — Ambulatory Visit: Payer: BLUE CROSS/BLUE SHIELD | Admitting: Internal Medicine

## 2016-02-14 ENCOUNTER — Other Ambulatory Visit: Payer: Self-pay | Admitting: *Deleted

## 2016-02-14 DIAGNOSIS — E876 Hypokalemia: Secondary | ICD-10-CM

## 2016-02-14 MED ORDER — POTASSIUM CHLORIDE ER 10 MEQ PO TBCR: 10.0000 meq | EXTENDED_RELEASE_TABLET | Freq: Every day | ORAL | 0 refills | Status: DC

## 2016-02-21 ENCOUNTER — Encounter: Payer: Self-pay | Admitting: Cardiovascular Disease

## 2016-02-21 ENCOUNTER — Ambulatory Visit (INDEPENDENT_AMBULATORY_CARE_PROVIDER_SITE_OTHER): Payer: BLUE CROSS/BLUE SHIELD | Admitting: Cardiovascular Disease

## 2016-02-21 VITALS — BP 119/79 | HR 90 | Ht 60.0 in | Wt 165.2 lb

## 2016-02-21 DIAGNOSIS — I10 Essential (primary) hypertension: Secondary | ICD-10-CM | POA: Diagnosis not present

## 2016-02-21 DIAGNOSIS — R002 Palpitations: Secondary | ICD-10-CM | POA: Diagnosis not present

## 2016-02-21 DIAGNOSIS — E785 Hyperlipidemia, unspecified: Secondary | ICD-10-CM

## 2016-02-21 NOTE — Assessment & Plan Note (Signed)
History of palpitations with the monitor showed only sinus rhythm. A 2-D echo was entirely normal. Her symptoms have resolved spontaneously.

## 2016-02-21 NOTE — Assessment & Plan Note (Signed)
History of hypertension with blood pressure measures 119/79. She is on amlodipine and spironolactone. Continue current meds at current dosing

## 2016-02-21 NOTE — Assessment & Plan Note (Signed)
History of hyperlipidemia on statin therapy with lipid profile performed a year ago that showed LDL 122 followed by her PCP.

## 2016-02-21 NOTE — Progress Notes (Signed)
02/21/2016 Lydia Walsh   04-27-1955  [id number]  Primary Physician Dellia Nims, MD Primary Cardiologist: Lorretta Harp MD Renae Gloss  HPI:  Lydia Walsh is a 61 year old moderately overweight divorced African American female mother of 3 children and is [place]. She was referred by her PCP for evaluation of palpitations. The left side her in the office 10/25/15. She's been evaluated for this in the past several years ago. She also has recent diagnosis of endometrial cancer in June of this year. She has no other cardiac risk factors. She does not drink caffeine. I ordered a 2-D echocardiogram which was entirely normal and the monitor that showed only sinus rhythm. Her symptoms palpitations have spontaneously resolved.   Current Outpatient Prescriptions  Medication Sig Dispense Refill  . allopurinol (ZYLOPRIM) 100 MG tablet TAKE ONE TABLET BY MOUTH ONCE DAILY 90 tablet 1  . amLODipine (NORVASC) 10 MG tablet Take 1 tablet (10 mg total) by mouth daily. 90 tablet 3  . Ferrous Fumarate (HEMOCYTE - 106 MG FE) 324 (106 Fe) MG TABS tablet Take 1 tablet (106 mg of iron total) by mouth daily. On an empty stomach with OJ 30 tablet 3  . levothyroxine (SYNTHROID, LEVOTHROID) 125 MCG tablet Take 1 tablet (125 mcg total) by mouth daily before breakfast. 90 tablet 3  . Melatonin 3 MG TABS Take 1 tablet by mouth at bedtime as needed.    . meloxicam (MOBIC) 7.5 MG tablet Take 1 tablet (7.5 mg total) by mouth daily. 30 tablet 2  . potassium chloride (K-DUR) 10 MEQ tablet Take 1 tablet (10 mEq total) by mouth daily. 30 tablet 0  . simvastatin (ZOCOR) 20 MG tablet Take 1 tablet (20 mg total) by mouth every evening. 30 tablet 11  . spironolactone (ALDACTONE) 50 MG tablet Take 1 tablet (50 mg total) by mouth daily. 30 tablet 2   No current facility-administered medications for this visit.     Allergies  Allergen Reactions  . Hctz [Hydrochlorothiazide] Rash     Social History   Social History  . Marital status: Widowed    Spouse name: N/A  . Number of children: 3  . Years of education: N/A   Occupational History  . security    Social History Main Topics  . Smoking status: Former Smoker    Years: 2.00    Types: Cigarettes    Quit date: 02/28/1994  . Smokeless tobacco: Never Used  . Alcohol use No  . Drug use: No  . Sexual activity: No   Other Topics Concern  . Not on file   Social History Narrative   Lives with her son.  Perimenopausal.     Review of Systems: General: negative for chills, fever, night sweats or weight changes.  Cardiovascular: negative for chest pain, dyspnea on exertion, edema, orthopnea, palpitations, paroxysmal nocturnal dyspnea or shortness of breath Dermatological: negative for rash Respiratory: negative for cough or wheezing Urologic: negative for hematuria Abdominal: negative for nausea, vomiting, diarrhea, bright red blood per rectum, melena, or hematemesis Neurologic: negative for visual changes, syncope, or dizziness All other systems reviewed and are otherwise negative except as noted above.    Blood pressure 119/79, pulse 90, height 5' (1.524 m), weight 165 lb 3.2 oz (74.9 kg), last menstrual period 07/16/2014.  General appearance: alert and no distress Neck: no adenopathy, no carotid bruit, no JVD, supple, symmetrical, trachea midline and thyroid not enlarged, symmetric, no tenderness/mass/nodules Lungs: clear to  auscultation bilaterally Heart: regular rate and rhythm, S1, S2 normal, no murmur, click, rub or gallop Extremities: extremities normal, atraumatic, no cyanosis or edema  EKG sinus rhythm at 90 without ST or T-wave changes. I personally reviewed this EKG  ASSESSMENT AND PLAN:   Essential hypertension History of hypertension with blood pressure measures 119/79. She is on amlodipine and spironolactone. Continue current meds at current dosing  Dyslipidemia History of  hyperlipidemia on statin therapy with lipid profile performed a year ago that showed LDL 122 followed by her PCP.  Palpitations History of palpitations with the monitor showed only sinus rhythm. A 2-D echo was entirely normal. Her symptoms have resolved spontaneously.      Lorretta Harp MD FACP,FACC,FAHA, [facility] 02/21/2016 10:21 AM

## 2016-02-21 NOTE — Patient Instructions (Signed)
Medication Instructions: Your physician recommends that you continue on your current medications as directed. Please refer to the Current Medication list given to you today.   Follow-Up: Your physician recommends that you schedule a follow-up appointment as needed with Dr. Berry.  If you need a refill on your cardiac medications before your next appointment, please call your pharmacy.  

## 2016-03-13 ENCOUNTER — Ambulatory Visit: Payer: BLUE CROSS/BLUE SHIELD | Admitting: Internal Medicine

## 2016-03-14 ENCOUNTER — Other Ambulatory Visit: Payer: Self-pay | Admitting: Hematology and Oncology

## 2016-03-14 DIAGNOSIS — E876 Hypokalemia: Secondary | ICD-10-CM

## 2016-03-17 DIAGNOSIS — N200 Calculus of kidney: Secondary | ICD-10-CM | POA: Diagnosis not present

## 2016-03-17 DIAGNOSIS — D49512 Neoplasm of unspecified behavior of left kidney: Secondary | ICD-10-CM | POA: Diagnosis not present

## 2016-03-18 ENCOUNTER — Other Ambulatory Visit: Payer: Self-pay | Admitting: Internal Medicine

## 2016-03-18 DIAGNOSIS — E876 Hypokalemia: Secondary | ICD-10-CM

## 2016-03-18 MED ORDER — POTASSIUM CHLORIDE ER 10 MEQ PO TBCR: 10.0000 meq | EXTENDED_RELEASE_TABLET | Freq: Every day | ORAL | 0 refills | Status: DC

## 2016-03-18 NOTE — Telephone Encounter (Signed)
Potassium Chloride 10 MEQ   [facility]  Asking if we can fill for her. Please call and discuss.

## 2016-03-19 ENCOUNTER — Other Ambulatory Visit: Payer: Self-pay | Admitting: Hematology and Oncology

## 2016-03-19 ENCOUNTER — Ambulatory Visit (HOSPITAL_BASED_OUTPATIENT_CLINIC_OR_DEPARTMENT_OTHER): Payer: BLUE CROSS/BLUE SHIELD

## 2016-03-19 VITALS — BP 128/68 | HR 94 | Temp 98.3°F | Resp 18

## 2016-03-19 DIAGNOSIS — Z95828 Presence of other vascular implants and grafts: Secondary | ICD-10-CM

## 2016-03-19 DIAGNOSIS — C541 Malignant neoplasm of endometrium: Secondary | ICD-10-CM | POA: Diagnosis not present

## 2016-03-19 DIAGNOSIS — Z452 Encounter for adjustment and management of vascular access device: Secondary | ICD-10-CM | POA: Diagnosis not present

## 2016-03-19 MED ORDER — HEPARIN SOD (PORK) LOCK FLUSH 100 UNIT/ML IV SOLN
500.0000 [IU] | Freq: Once | INTRAVENOUS | Status: AC
  Administered 2016-03-19: 500 [IU] via INTRAVENOUS
  Filled 2016-03-19: qty 5

## 2016-03-19 MED ORDER — SODIUM CHLORIDE 0.9% FLUSH
10.0000 mL | INTRAVENOUS | Status: DC | PRN
  Administered 2016-03-19: 10 mL via INTRAVENOUS
  Filled 2016-03-19: qty 10

## 2016-03-19 MED ORDER — ALTEPLASE 2 MG IJ SOLR
2.0000 mg | Freq: Once | INTRAMUSCULAR | Status: DC | PRN
  Filled 2016-03-19: qty 2

## 2016-03-19 NOTE — Patient Instructions (Signed)
Implanted Port Home Guide An implanted port is a type of central line that is placed under the skin. Central lines are used to provide IV access when treatment or nutrition needs to be given through a person's veins. Implanted ports are used for long-term IV access. An implanted port may be placed because:  You need IV medicine that would be irritating to the small veins in your hands or arms.  You need long-term IV medicines, such as antibiotics.  You need IV nutrition for a long period.  You need frequent blood draws for lab tests.  You need dialysis.  Implanted ports are usually placed in the chest area, but they can also be placed in the upper arm, the abdomen, or the leg. An implanted port has two main parts:  Reservoir. The reservoir is round and will appear as a small, raised area under your skin. The reservoir is the part where a needle is inserted to give medicines or draw blood.  Catheter. The catheter is a thin, flexible tube that extends from the reservoir. The catheter is placed into a large vein. Medicine that is inserted into the reservoir goes into the catheter and then into the vein.  How will I care for my incision site? Do not get the incision site wet. Bathe or shower as directed by your health care provider. How is my port accessed? Special steps must be taken to access the port:  Before the port is accessed, a numbing cream can be placed on the skin. This helps numb the skin over the port site.  Your health care provider uses a sterile technique to access the port. ? Your health care provider must put on a mask and sterile gloves. ? The skin over your port is cleaned carefully with an antiseptic and allowed to dry. ? The port is gently pinched between sterile gloves, and a needle is inserted into the port.  Only "non-coring" port needles should be used to access the port. Once the port is accessed, a blood return should be checked. This helps ensure that the port  is in the vein and is not clogged.  If your port needs to remain accessed for a constant infusion, a clear (transparent) bandage will be placed over the needle site. The bandage and needle will need to be changed every week, or as directed by your health care provider.  Keep the bandage covering the needle clean and dry. Do not get it wet. Follow your health care provider's instructions on how to take a shower or bath while the port is accessed.  If your port does not need to stay accessed, no bandage is needed over the port.  What is flushing? Flushing helps keep the port from getting clogged. Follow your health care provider's instructions on how and when to flush the port. Ports are usually flushed with saline solution or a medicine called heparin. The need for flushing will depend on how the port is used.  If the port is used for intermittent medicines or blood draws, the port will need to be flushed: ? After medicines have been given. ? After blood has been drawn. ? As part of routine maintenance.  If a constant infusion is running, the port may not need to be flushed.  How long will my port stay implanted? The port can stay in for as long as your health care provider thinks it is needed. When it is time for the port to come out, surgery will be   done to remove it. The procedure is similar to the one performed when the port was put in. When should I seek immediate medical care? When you have an implanted port, you should seek immediate medical care if:  You notice a bad smell coming from the incision site.  You have swelling, redness, or drainage at the incision site.  You have more swelling or pain at the port site or the surrounding area.  You have a fever that is not controlled with medicine.  This information is not intended to replace advice given to you by your health care provider. Make sure you discuss any questions you have with your health care provider. Document  Released: 12/29/2004 Document Revised: 06/06/2015 Document Reviewed: 09/05/2012 Elsevier Interactive Patient Education  2017 [employer] Inc[employer]  

## 2016-04-02 ENCOUNTER — Encounter: Payer: Self-pay | Admitting: Radiation Oncology

## 2016-04-02 ENCOUNTER — Ambulatory Visit
Admission: RE | Admit: 2016-04-02 | Discharge: 2016-04-02 | Disposition: A | Discharge status: Home / Self Care | Payer: BLUE CROSS/BLUE SHIELD | Source: Ambulatory Visit | Attending: Radiation Oncology | Admitting: Radiation Oncology

## 2016-04-02 VITALS — BP 137/75 | HR 98 | Temp 98.8°F | Ht 60.0 in | Wt 168.4 lb

## 2016-04-02 DIAGNOSIS — C541 Malignant neoplasm of endometrium: Secondary | ICD-10-CM

## 2016-04-02 DIAGNOSIS — Z923 Personal history of irradiation: Secondary | ICD-10-CM | POA: Diagnosis not present

## 2016-04-02 DIAGNOSIS — Z888 Allergy status to other drugs, medicaments and biological substances status: Secondary | ICD-10-CM | POA: Insufficient documentation

## 2016-04-02 DIAGNOSIS — Z08 Encounter for follow-up examination after completed treatment for malignant neoplasm: Secondary | ICD-10-CM | POA: Diagnosis not present

## 2016-04-02 DIAGNOSIS — Z79899 Other long term (current) drug therapy: Secondary | ICD-10-CM | POA: Diagnosis not present

## 2016-04-02 NOTE — Progress Notes (Signed)
Lydia Walsh is here for follow up.  She reports having dull pain on and off in her left chest last night.  She denies having shortness of breath.  She also reports having stomach pain due to her gallbladder and also occasional pain her left groin.  She denies having any bladder issues.  She reports having constipation alternating with diarrhea.  She reports using her dilator occasionally and does occasionally see some blood on the top of the dilator.  She reports her appetite and energy level is better and is thinking about going back to work part time.  BP 137/75 (BP Location: Right Arm, Patient Position: Sitting)   Pulse 98   Temp 98.8 F (37.1 C) (Oral)   Ht 5' (1.524 m)   Wt 168 lb 6.4 oz (76.4 kg)   LMP 07/16/2014   SpO2 100%   BMI 32.89 kg/m    Wt Readings from Last 3 Encounters:  04/02/16 168 lb 6.4 oz (76.4 kg)  02/21/16 165 lb 3.2 oz (74.9 kg)  02/03/16 164 lb 12.8 oz (74.8 kg)

## 2016-04-02 NOTE — Progress Notes (Signed)
Radiation Oncology         [phone] ________________________________  Name: Lydia Walsh MRN: [id number]  Date: 04/02/2016  DOB: [birth year]  Follow-Up Visit Note  CC: Dellia Nims, MD  Gordy Levan, MD    ICD-9-CM ICD-10-CM   1. Endometrial cancer (HCC) 182.0 C54.1   2. International Federation 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 ([facility]) 182.0 C54.1     Diagnosis: Stage IIIC-1 (pT1b, pN0) grade 2 endometrioid adenocarcinoma    Interval Since Last Radiation:  5 months   1) 09/02/15-10/08/15: External beam radiation to the pelvis - 50 Gy in 25 fractions 2) 10/15/15, 10/22/15, 10/24/15 - HDR vaginal brachytherapy - 18 Gy in 3 fractions.  Narrative:  The patient returns today for routine follow-up. The patient followed up with Dr. Denman George on 12/30/15 who noted a normal pelvic exam. However, she wanted to refer the patient to [facility] concerning a persistent hypodense mass involving the lower pole of the left kidney worrisome for renal cell carcinoma. The patient saw Dr. Tresa Moore on 02/17/16 and the recommendation was another scan in 1 year. The patient saw Dr. Marko Plume on 01/23/16   She reports having dull pain on and off in her left chest last night. The patient saw her cardiologist, Dr. Gwenlyn Found, on 02/21/16 and exam at that time was normal. She denies having shortness of breath. She also reports having stomach pain due to her gallbladder and occasional pain her left groin. She denies having any bladder issues. She reports having constipation alternating with diarrhea. She reports using her dilator occasionally and does occasionally see some blood on the top of the dilator. She reports her appetite and energy level is better and is thinking about going back to work part time.  ALLERGIES:  is allergic to hctz [hydrochlorothiazide].  Meds: Current Outpatient Prescriptions  Medication Sig Dispense Refill  . allopurinol (ZYLOPRIM) 100 MG  tablet TAKE ONE TABLET BY MOUTH ONCE DAILY 90 tablet 1  . amLODipine (NORVASC) 10 MG tablet Take 1 tablet (10 mg total) by mouth daily. 90 tablet 3  . Ferrous Fumarate (HEMOCYTE - 106 MG FE) 324 (106 Fe) MG TABS tablet Take 1 tablet (106 mg of iron total) by mouth daily. On an empty stomach with OJ 30 tablet 3  . levothyroxine (SYNTHROID, LEVOTHROID) 125 MCG tablet Take 1 tablet (125 mcg total) by mouth daily before breakfast. 90 tablet 3  . potassium chloride (K-DUR) 10 MEQ tablet Take 1 tablet (10 mEq total) by mouth daily. 90 tablet 0  . simvastatin (ZOCOR) 20 MG tablet Take 1 tablet (20 mg total) by mouth every evening. 30 tablet 11  . spironolactone (ALDACTONE) 50 MG tablet Take 1 tablet (50 mg total) by mouth daily. 30 tablet 2  . Melatonin 3 MG TABS Take 1 tablet by mouth at bedtime as needed.    . meloxicam (MOBIC) 7.5 MG tablet Take 1 tablet (7.5 mg total) by mouth daily. (Patient not taking: Reported on 04/02/2016) 30 tablet 2  . omeprazole (PRILOSEC) 20 MG capsule   0   No current facility-administered medications for this encounter.     Physical Findings: The patient is in no acute distress. Patient is alert and oriented.  height is 5' (1.524 m) and weight is 168 lb 6.4 oz (76.4 kg). Her oral temperature is 98.8 F (37.1 C). Her blood pressure is 137/75 and her pulse is 98. Her oxygen saturation is 100%.   Lungs are clear to auscultation  bilaterally. Heart has regular rate and rhythm. No palpable cervical, supraclavicular, or axillary adenopathy. Abdomen soft, non-tender, normal bowel sounds.  On pelvic examination the external genitalia were unremarkable. A speculum exam was performed. The vaginal vault is narrowed and foreshortened. There are no mucosal lesions noted in the vaginal vault. Minimal bleeding noted on exam. On bimanual and rectovaginal examination there were no pelvic masses appreciated.  Lab Findings: Lab Results  Component Value Date   WBC 4.4 01/23/2016   HGB  11.1 (L) 01/23/2016   HCT 33.0 (L) 01/23/2016   MCV 90.7 01/23/2016   PLT 182 01/23/2016    Radiographic Findings: No results found.  Impression:  The patient is recovering from the effects of radiation. No evidence of recurrence on clinical exam.  Plan: The patient is scheduled to follow up with Dr. Skeet Latch on 05/21/16. She will follow up with radiation oncology in 08/2016. ____________________________________ -----------------------------------  Blair Promise, PhD, MD  This document serves as a record of services personally performed by Gery Pray, MD. It was created on his behalf by Darcus Austin, a trained medical scribe. The creation of this record is based on the scribe's personal observations and the provider's statements to them. This document has been checked and approved by the attending provider.

## 2016-04-03 ENCOUNTER — Other Ambulatory Visit: Payer: Self-pay | Admitting: Hematology and Oncology

## 2016-04-14 ENCOUNTER — Ambulatory Visit: Payer: BLUE CROSS/BLUE SHIELD

## 2016-04-17 ENCOUNTER — Ambulatory Visit (INDEPENDENT_AMBULATORY_CARE_PROVIDER_SITE_OTHER): Payer: BLUE CROSS/BLUE SHIELD | Admitting: Internal Medicine

## 2016-04-17 ENCOUNTER — Encounter: Payer: Self-pay | Admitting: Internal Medicine

## 2016-04-17 VITALS — BP 104/64 | HR 100 | Ht 59.0 in | Wt 169.1 lb

## 2016-04-17 DIAGNOSIS — K8020 Calculus of gallbladder without cholecystitis without obstruction: Secondary | ICD-10-CM

## 2016-04-17 DIAGNOSIS — K219 Gastro-esophageal reflux disease without esophagitis: Secondary | ICD-10-CM | POA: Diagnosis not present

## 2016-04-17 DIAGNOSIS — Z1211 Encounter for screening for malignant neoplasm of colon: Secondary | ICD-10-CM

## 2016-04-17 DIAGNOSIS — R1310 Dysphagia, unspecified: Secondary | ICD-10-CM | POA: Diagnosis not present

## 2016-04-17 DIAGNOSIS — R1319 Other dysphagia: Secondary | ICD-10-CM

## 2016-04-17 NOTE — Progress Notes (Signed)
Lydia Walsh 61 y.o. [birth year] [id number] Referred by: Florinda Marker, MD  Assessment & Plan:   Encounter Diagnoses  Name Primary?  . Symptomatic cholelithiasis Yes  . Gastroesophageal reflux disease, esophagitis presence not specified   . Esophageal dysphagia   . Colon cancer screening    Stay on PPIWhich has treated GERD and esophageal dysphagia. Since the dysphagia completely resolved on PPI I think it safe to attribute this to reflux and I don't think an endoscopy is needed. [facility] referral re: gallstones Screening colonoscopyHas been chosen by the patient as her screening option The risks and benefits as well as alternatives of endoscopic procedure(s) have been discussed and reviewed. All questions answered. The patient agrees to proceed.   CC: Dr. Martyn Malay Dr. Verita Lamb  Subjective:   Chief Complaint: Originally was dysphagia, that has resolved, colon cancer screening  HPI Patient is a very nice 61 year old African-American woman known to me from prior colonoscopy about 11 years ago, who was originally referred her dysphagia issues. He was placed on a PPI, omeprazole, taking it daily and the dysphagia resolved and heartburn symptoms that she was having resolved. Busy year last year having been diagnosed with an treated for uterine cancer. She is about to go back to work. She was asking about colon cancer screening and Cologuard versus colonoscopy.  Also has a number of years her intermittent postprandial upper abdominal pain and seems epigastric and right upper quadrant it may radiate to the left upper quadrant but it occurs several hours after fatty meals. It happens about 5 times a month. She is aware she has gallstones and is asking about what can be done about this.  Review of systems is otherwise negative. Allergies  Allergen Reactions  . Hctz [Hydrochlorothiazide] Rash   Current Meds  Medication Sig  . allopurinol (ZYLOPRIM) 100 MG tablet TAKE ONE TABLET BY  MOUTH ONCE DAILY  . amLODipine (NORVASC) 10 MG tablet Take 1 tablet (10 mg total) by mouth daily.  . Ferrous Fumarate (HEMOCYTE - 106 MG FE) 324 (106 Fe) MG TABS tablet Take 1 tablet (106 mg of iron total) by mouth daily. On an empty stomach with OJ  . ibuprofen (ADVIL,MOTRIN) 200 MG tablet Take 200 mg by mouth as needed.  Marland Kitchen levothyroxine (SYNTHROID, LEVOTHROID) 125 MCG tablet Take 1 tablet (125 mcg total) by mouth daily before breakfast.  . omeprazole (PRILOSEC) 20 MG capsule daily.   . potassium chloride (K-DUR) 10 MEQ tablet Take 1 tablet (10 mEq total) by mouth daily.  . simvastatin (ZOCOR) 20 MG tablet Take 1 tablet (20 mg total) by mouth every evening.  Marland Kitchen spironolactone (ALDACTONE) 50 MG tablet Take 1 tablet (50 mg total) by mouth daily.   Past Medical History:  Diagnosis Date  . Cancer [facility])    endometrial cancer  . Endometrial polyp   . GERD (gastroesophageal reflux disease)   . Heart murmur   . History of radiation therapy   . History of uterine fibroid   . Hyperlipidemia   . Hypertension   . Hypothyroidism   . OSA (obstructive sleep apnea)    moderate OSA per study 06-21-2005--  per pt does need cpap anymore never purchased it  . Osteoarthritis   . PMB (postmenopausal bleeding)   . Wears dentures    upper  . Wears glasses    Past Surgical History:  Procedure Laterality Date  . COLONOSCOPY    . DILATATION & CURETTAGE/HYSTEROSCOPY WITH MYOSURE N/A 03/07/2015   Procedure:  DILATATION & CURETTAGE/HYSTEROSCOPY WITH MYOSURE (POLYP);  Surgeon: Arvella Nigh, MD;  Location: [facility];  Service: Gynecology;  Laterality: N/A;  . DILATATION & CURRETTAGE/HYSTEROSCOPY WITH RESECTOCOPE  12-13-2002  &  09-26-2004   polyp/  submucosal fibroid  . ESOPHAGOGASTRODUODENOSCOPY    . EXCISION BENIGN RIGHT BREAST MASS  age 34  . KNEE ARTHROSCOPY W/ MENISCECTOMY Bilateral right 04-12-2006//  left 12-29-2006   and Chondroplasty  . ROBOTIC ASSISTED TOTAL HYSTERECTOMY WITH  BILATERAL SALPINGO OOPHERECTOMY Bilateral 05/02/2015   Procedure: XI ROBOTIC ASSISTED TOTAL HYSTERECTOMY WITH BILATERAL SALPINGO OOPHORECTOMY AND SENTINAL LYMPH NODE BIOPSY;  Surgeon: Janie Morning, MD;  Location: [facility];  Service: Gynecology;  Laterality: Bilateral;  . TRANSTHORACIC ECHOCARDIOGRAM  04-02-2010   normal LV, ef 65%  . TUBAL LIGATION  1980's    family history includes Cirrhosis in her father; Hypertension in her mother.   Review of Systems Some arthritis and insomnia problems. All other review of systems are negative.  Objective:   Physical Exam @BP  104/64   Pulse 100   Ht 4\' 11"  (1.499 m)   Wt 169 lb 2 oz (76.7 kg)   LMP 07/16/2014   BMI 34.16 kg/m @  General:  Well-developed, well-nourished and in no acute distress Eyes:  anicteric. ENT:   Mouth and posterior pharynx free of lesions.  Neck:   supple w/o thyromegaly or mass.  Lungs: Clear to auscultation bilaterally. Heart:  S1S2, no rubs, murmurs, gallops. Abdomen:  soft, non-tender, no hepatosplenomegaly, hernia, or mass and BS+.  Rectal: deferred Lymph:  no cervical or supraclavicular adenopathy. Extremities:   no edema, cyanosis or clubbing Skin   no rash. Neuro:  A&O x 3.  Psych:  appropriate mood and  Affect.   Data Reviewed: December 2017 CT abdomen and pelvis  Prior abdominal ultrasound 2016 showing gallstones  2018 labs in the computer

## 2016-04-17 NOTE — Patient Instructions (Signed)
You have been scheduled for a colonoscopy. Please follow written instructions given to you at your visit today.  Please pick up your prep supplies at the pharmacy within the next 1-3 days. If you use inhalers (even only as needed), please bring them with you on the day of your procedure. Your physician has requested that you go to www.startemmi.com and enter the access code given to you at your visit today. This web site gives a general overview about your procedure. However, you should still follow specific instructions given to you by our office regarding your preparation for the procedure.  Normal BMI (Body Mass Index- based on height and weight) is between 19 and 25. Your BMI today is Body mass index is 34.16 kg/m. Marland Kitchen Please consider follow up  regarding your BMI with your Primary Care Provider.   You have been scheduled for an appointment with _____________ at [facility]. Your appointment is on __________ at __________. Please arrive at ______________ for registration. Make certain to bring a list of current medications, including any over the counter medications or vitamins. Also bring your co-pay if you have one as well as your insurance cards. [facility] is located at [address]. Should you need to reschedule your appointment, please contact them at [phone].   I appreciate the opportunity to care for you. [name], MD, [facility]

## 2016-04-20 ENCOUNTER — Telehealth: Payer: Self-pay

## 2016-04-20 NOTE — Telephone Encounter (Signed)
Received a fax from [facility] that patient has appointment with Dr Georgette Dover on 04/27/16 at 9:50 arrival time for a 10:20AM appointment.  They are going to mail information out to the patient.  I spoke to [place] and she wrote the date/time down.

## 2016-04-23 ENCOUNTER — Other Ambulatory Visit: Payer: Self-pay | Admitting: Hematology and Oncology

## 2016-04-23 ENCOUNTER — Telehealth: Payer: Self-pay

## 2016-04-23 DIAGNOSIS — D509 Iron deficiency anemia, unspecified: Secondary | ICD-10-CM

## 2016-04-23 MED ORDER — FERROUS FUMARATE 324 (106 FE) MG PO TABS: 1.0000 | ORAL_TABLET | Freq: Every day | ORAL | 3 refills | Status: DC

## 2016-04-23 NOTE — Telephone Encounter (Signed)
Called patient with below message,

## 2016-04-23 NOTE — Telephone Encounter (Signed)
Patient called requesting Ferrous Sulfate refill.

## 2016-04-23 NOTE — Telephone Encounter (Signed)
I refilled electronically 

## 2016-04-27 ENCOUNTER — Ambulatory Visit: Payer: Self-pay | Admitting: Surgery

## 2016-04-27 DIAGNOSIS — K8010 Calculus of gallbladder with chronic cholecystitis without obstruction: Secondary | ICD-10-CM | POA: Diagnosis not present

## 2016-04-27 NOTE — H&P (Signed)
History of Present Illness Lydia Walsh. [name] MD; 04/27/2016 12:28 PM) The patient is a 61 year old female who presents for evaluation of gall stones. Referred by Dr. [name] for symptomatic gallstones  This is a 61 year old female who presented to Dr. [name] recently with some dysphasia and some intermittent postprandial upper abdominal pain. The dysphagia has improved on PPI. The patient has known that she has had gallstones for several years. 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 She has completed chemotherapy and radiation therapy. Recent CT scan showed no sign of malignancy but she does have gallstones. The patient states that she has had intermittent postprandial right upper quadrant abdominal pain over the last several years but recently has become more frequent and more severe. These episodes to the last couple of hours. She does experience some mild nausea. Rarely does she have diarrhea. She does report some slight abdominal bloating.  She is scheduled for colonoscopy on 04/30/16 for colon cancer screening. 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  CLINICAL DATA: Endometrial carcinoma diagnosed in 2017. Total hysterectomy. Chemotherapy and radiation therapy completed last month.  EXAM: CT ABDOMEN AND PELVIS WITH CONTRAST  TECHNIQUE: Multidetector CT imaging of the abdomen and pelvis was performed using the standard protocol following bolus administration of intravenous contrast.  CONTRAST: 166mL ISOVUE-300 IOPAMIDOL (ISOVUE-300) INJECTION 61%  COMPARISON: Abdominopelvic CT 05/24/2015. PET-CT 06/12/2015.  FINDINGS: Lower chest: Clear lung bases. There is no pleural or pericardial effusion. Central venous catheter tip in the upper right atrium. There is atherosclerosis of the thoracic aorta and coronary arteries.  Hepatobiliary: The liver appears stable without suspicious  finding. There is no biliary dilatation or gallbladder wall thickening. Multiple noncalcified gallstones are present within the gallbladder lumen.  Pancreas: Mildly atrophied without focal abnormality or surrounding inflammation.  Spleen: Normal in size without focal abnormality. Small splenule.  Adrenals/Urinary Tract: Both adrenal glands appear normal. There is stable cortical scarring in the upper pole the right kidney. Persistent hypodense mass involving the lower pole the left kidney, measuring approximately 15 x 14 mm on image 35. This measures 68 HU on the immediate postcontrast images and 63 HU on the delayed images. This lesion remains concerning for renal cell carcinoma. There is a tiny cyst anteriorly in the mid left kidney. No hydronephrosis. The bladder is nearly empty without apparent abnormality.  Stomach/Bowel: No evidence of bowel wall thickening, distention or surrounding inflammatory change. There are diverticular changes throughout the distal colon, greatest in the sigmoid. No evidence of active inflammation. The appendix appears normal.  Vascular/Lymphatic: There are no residual enlarged abdominopelvic lymph nodes. Stable aortic and branch vessel atherosclerosis.  Reproductive: Hysterectomy. Trace free pelvic fluid. No evidence of adnexal mass.  Other: No generalized ascites or peritoneal nodularity. Stable small umbilical hernia containing only fat.  Musculoskeletal: No acute or significant osseous findings. Multilevel spondylosis again noted.  IMPRESSION: 1. No residual lymphadenopathy or other evidence of metastatic endometrial carcinoma. 2. Persistent hypodense mass involving the lower pole of the left kidney, likely enhancing as correlated with previous PET-CT. This remains worrisome for renal cell carcinoma. Again, renal mass protocol MRI abdomen without and with contrast recommended. 3. Stable incidental findings including  cholelithiasis, atherosclerosis, distal colonic diverticulosis and lumbar spondylosis.   Electronically Signed By: [name] M.D. On: 12/27/2015 11:26  CLINICAL DATA: Right upper quadrant pain  EXAM: ULTRASOUND ABDOMEN COMPLETE  COMPARISON: None.  FINDINGS: Gallbladder: Well distended  with multiple gallstones within. No wall thickening or pericholecystic fluid is noted.  Common bile duct: Diameter: 4.6 mm.  Liver: Increase in echogenicity consistent with fatty infiltration.  IVC: No abnormality visualized.  Pancreas: Visualized portion unremarkable.  Spleen: Size and appearance within normal limits.  Right Kidney: Length: 10.9 cm. Echogenicity within normal limits. No mass or hydronephrosis visualized.  Left Kidney: Length: 11.1 cm. Echogenicity within normal limits. No mass or hydronephrosis visualized.  Abdominal aorta: No aneurysm visualized.  Other findings: None.  IMPRESSION: Cholelithiasis without complicating factors.  Fatty liver.   Electronically Signed By: [name] M.D. On: 03/31/2014 08:14   LFT's WNL on 01/23/16    Allergies ([name], CMA; 04/27/2016 10:43 AM) HydroCHLOROthiazide *DIURETICS*  Medication History ([name], CMA; 04/27/2016 10:47 AM) Allopurinol (100MG  Tablet, Oral) Active. AmLODIPine Besylate (10MG  Tablet, Oral) Active. Ferrous Fumarate (324 (106 Fe)MG Tablet, Oral) Active. Levothyroxine Sodium (125MCG Tablet, Oral) Active. Omeprazole (20MG  Capsule DR, Oral) Active. Potassium Chloride (28mEq) (0.2MEQ/ML Solution, Injection) Active. Simvastatin (20MG  Tablet, Oral) Active. Spironolactone (50MG  Tablet, Oral) Active. Medications Reconciled    Vitals ([name] CMA; 04/27/2016 10:47 AM) 04/27/2016 10:47 AM Weight: 169.8 lb Height: 60in Body Surface Area: 1.74 m Body Mass Index: 33.16 kg/m  Temp.: 98.18F  Pulse: 86 (Regular)  BP: 120/70 (Sitting, Left Arm,  Standard)      Physical Exam Rodman Key K. Davidlee Jeanbaptiste MD; 04/27/2016 12:29 PM)  The physical exam findings are as follows: Note:WDWN in NAD Eyes: Pupils equal, round; sclera anicteric HENT: Oral mucosa moist; good dentition Neck: No masses palpated, no thyromegaly Lungs: CTA bilaterally; normal respiratory effort CV: Regular rate and rhythm; no murmurs; extremities well-perfused with no edema Abd: +bowel sounds, soft, mildly tender in epigastrium and RUQ, no palpable organomegaly; no palpable hernias; healed laparoscopic incisions Skin: Warm, dry; no sign of jaundice Psychiatric - alert and oriented x 4; calm mood and affect    Assessment & Plan Rodman Key K. Merrik Puebla MD; 04/27/2016 11:02 AM)  CHRONIC CHOLECYSTITIS WITH CALCULUS (K80.10)  Current Plans Schedule for Surgery - Laparoscopic cholecystectomy with intraoperative cholangiogram. The surgical procedure has been discussed with the patient. Potential risks, benefits, alternative treatments, and expected outcomes have been explained. All of the patient's questions at this time have been answered. The likelihood of reaching the patient's treatment goal is good. The patient understand the proposed surgical procedure and wishes to proceed.  Lydia Walsh. Georgette Dover, MD, [facility] Surgery  General/ Trauma Surgery  04/27/2016 12:29 PM

## 2016-04-29 ENCOUNTER — Ambulatory Visit (AMBULATORY_SURGERY_CENTER): Payer: BLUE CROSS/BLUE SHIELD | Admitting: Internal Medicine

## 2016-04-29 ENCOUNTER — Encounter: Payer: Self-pay | Admitting: Internal Medicine

## 2016-04-29 VITALS — BP 111/74 | HR 72 | Temp 96.6°F | Resp 17 | Ht 59.0 in | Wt 169.0 lb

## 2016-04-29 DIAGNOSIS — Z1211 Encounter for screening for malignant neoplasm of colon: Secondary | ICD-10-CM | POA: Diagnosis not present

## 2016-04-29 DIAGNOSIS — Z1212 Encounter for screening for malignant neoplasm of rectum: Secondary | ICD-10-CM | POA: Diagnosis not present

## 2016-04-29 MED ORDER — SODIUM CHLORIDE 0.9 % IV SOLN: 500.0000 mL | INTRAVENOUS | Status: DC

## 2016-04-29 NOTE — Progress Notes (Signed)
Pt's states no medical or surgical changes since previsit or office visit. 

## 2016-04-29 NOTE — Patient Instructions (Addendum)
No polyps or cancer seen in the colon.  You do have diverticulosis - thickened muscle rings and pouches in the colon wall. Please read the handout about this condition.  Next routine colonoscopy or other screening test in 10 years - 2028  Good luck with gallbladder surgery.  I appreciate the opportunity to care for you. Gatha Mayer, MD, FACG YOU HAD AN ENDOSCOPIC PROCEDURE TODAY AT [facility]:   Refer to the procedure report that was given to you for any specific questions about what was found during the examination.  If the procedure report does not answer your questions, please call your gastroenterologist to clarify.  If you requested that your care partner not be given the details of your procedure findings, then the procedure report has been included in a sealed envelope for you to review at your convenience later.  YOU SHOULD EXPECT: Some feelings of bloating in the abdomen. Passage of more gas than usual.  Walking can help get rid of the air that was put into your GI tract during the procedure and reduce the bloating. If you had a lower endoscopy (such as a colonoscopy or flexible sigmoidoscopy) you may notice spotting of blood in your stool or on the toilet paper. If you underwent a bowel prep for your procedure, you may not have a normal bowel movement for a few days.  Please Note:  You might notice some irritation and congestion in your nose or some drainage.  This is from the oxygen used during your procedure.  There is no need for concern and it should clear up in a day or so.  SYMPTOMS TO REPORT IMMEDIATELY:   Following lower endoscopy (colonoscopy or flexible sigmoidoscopy):  Excessive amounts of blood in the stool  Significant tenderness or worsening of abdominal pains  Swelling of the abdomen that is new, acute  Fever of 100F or higher  For urgent or emergent issues, a gastroenterologist can be reached at any hour by calling [phone]  [phone].   DIET:  We do recommend a small meal at first, but then you may proceed to your regular diet.  Drink plenty of fluids but you should avoid alcoholic beverages for 24 hours.  MEDICATIONS:  Continue present medications.  Please see handouts given to you by your recovery nurse.  ACTIVITY:  You should plan to take it easy for the rest of today and you should NOT DRIVE or use heavy machinery until tomorrow (because of the sedation medicines used during the test).    FOLLOW UP: Our staff will call the number listed on your records the next business day following your procedure to check on you and address any questions or concerns that you may have regarding the information given to you following your procedure. If we do not reach you, we will leave a message.  However, if you are feeling well and you are not experiencing any problems, there is no need to return our call.  We will assume that you have returned to your regular daily activities without incident.  If any biopsies were taken you will be contacted by phone or by letter within the next 1-3 weeks.  Please call us at [phone] if you have not heard about the biopsies in 3 weeks.   Thank you for allowing [place] to provide for our healthcare needs today.   SIGNATURES/CONFIDENTIALITY: You and/or your care partner have signed paperwork which will be entered into your electronic medical record.  These  signatures attest to the fact that that the information above on your After Visit Summary has been reviewed and is understood.  Full responsibility of the confidentiality of this discharge information lies with you and/or your care-partner. 

## 2016-04-29 NOTE — Progress Notes (Signed)
A and O x3. Report to RN. Tolerated MAC anesthesia well.

## 2016-04-29 NOTE — Op Note (Signed)
[facility] Patient Name: Lydia Walsh Procedure Date: 04/29/2016 7:48 AM MRN: [id number] Endoscopist: Gatha Mayer , MD Age: 61 Referring MD:  Date of Birth: 01-29-1955 Gender: Female Account #: [id number] Procedure:                Colonoscopy Indications:              Screening for colorectal malignant neoplasm Medicines:                Propofol per Anesthesia, Monitored Anesthesia Care Procedure:                Pre-Anesthesia Assessment:                           - Prior to the procedure, a History and Physical                            was performed, and patient medications and                            allergies were reviewed. The patient's tolerance of                            previous anesthesia was also reviewed. The risks                            and benefits of the procedure and the sedation                            options and risks were discussed with the patient.                            All questions were answered, and informed consent                            was obtained. Prior Anticoagulants: The patient has                            taken no previous anticoagulant or antiplatelet                            agents. ASA Grade Assessment: II - A patient with                            mild systemic disease. After reviewing the risks                            and benefits, the patient was deemed in                            satisfactory condition to undergo the procedure.                           After obtaining informed consent, the colonoscope  was passed under direct vision. Throughout the                            procedure, the patient's blood pressure, pulse, and                            oxygen saturations were monitored continuously. The                            Colonoscope was introduced through the anus and                            advanced to the the cecum, identified by   appendiceal orifice and ileocecal valve. The                            colonoscopy was performed without difficulty. The                            patient tolerated the procedure well. The quality                            of the bowel preparation was excellent. The                            ileocecal valve, appendiceal orifice, and rectum                            were photographed. Scope In: 8:01:57 AM Scope Out: 8:17:31 AM Scope Withdrawal Time: 0 hours 11 minutes 1 second  Total Procedure Duration: 0 hours 15 minutes 34 seconds  Findings:                 The perianal and digital rectal examinations were                            normal.                           Multiple small and large-mouthed diverticula were                            found in the sigmoid colon.                           The exam was otherwise without abnormality on                            direct and retroflexion views. Complications:            No immediate complications. Estimated Blood Loss:     Estimated blood loss: none. Impression:               - Diverticulosis in the sigmoid colon.                           - The examination was otherwise normal  on direct                            and retroflexion views.                           - No specimens collected. Recommendation:           - Patient has a contact number available for                            emergencies. The signs and symptoms of potential                            delayed complications were discussed with the                            patient. Return to normal activities tomorrow.                            Written discharge instructions were provided to the                            patient.                           - Resume previous diet.                           - Continue present medications.                           - Repeat colonoscopy/other test in 10 years for                            screening purposes. Gatha Mayer, MD 04/29/2016 8:26:35 AM This report has been signed electronically.

## 2016-04-30 ENCOUNTER — Ambulatory Visit: Payer: Self-pay | Admitting: Radiation Oncology

## 2016-04-30 ENCOUNTER — Telehealth: Payer: Self-pay | Admitting: *Deleted

## 2016-04-30 NOTE — Telephone Encounter (Signed)
Ok - good plan

## 2016-04-30 NOTE — Telephone Encounter (Signed)
Pt called about "heart flutters", refused all appts, offered, ask to got to urg care or ED- refused States this heart flutter x 2 weeks, gets a little short of breath, tired. Advised to also call dr berry- cardiologist office. Pt called back states she has appt w/ dr berry in am, made f/u with pcp- 1 month overdue

## 2016-04-30 NOTE — Telephone Encounter (Signed)
  Follow up Call-  Call back number 04/29/2016  Post procedure Call Back phone  # [phone]  Permission to leave phone message Yes  Some recent data might be hidden     No answer at # given.  Left message that we will call back this afternoon.

## 2016-04-30 NOTE — Telephone Encounter (Signed)
  Follow up Call-  Call back number 04/29/2016  Post procedure Call Back phone  # [phone]  Permission to leave phone message Yes  Some recent data might be hidden     Patient questions:  Do you have a fever, pain , or abdominal swelling? No. Pain Score  0 *  Have you tolerated food without any problems? Yes.    Have you been able to return to your normal activities? Yes.    Do you have any questions about your discharge instructions: Diet   No. Medications  No. Follow up visit  No.  Do you have questions or concerns about your Care? No.  Actions: * If pain score is 4 or above: No action needed, pain <4.

## 2016-05-01 ENCOUNTER — Encounter: Payer: Self-pay | Admitting: Cardiovascular Disease

## 2016-05-01 ENCOUNTER — Ambulatory Visit (INDEPENDENT_AMBULATORY_CARE_PROVIDER_SITE_OTHER): Payer: BLUE CROSS/BLUE SHIELD | Admitting: Cardiovascular Disease

## 2016-05-01 ENCOUNTER — Ambulatory Visit: Payer: BLUE CROSS/BLUE SHIELD

## 2016-05-01 VITALS — BP 110/64 | HR 91 | Ht 60.0 in | Wt 167.0 lb

## 2016-05-01 DIAGNOSIS — R002 Palpitations: Secondary | ICD-10-CM | POA: Diagnosis not present

## 2016-05-01 DIAGNOSIS — I10 Essential (primary) hypertension: Secondary | ICD-10-CM | POA: Diagnosis not present

## 2016-05-01 MED ORDER — AMLODIPINE BESYLATE 5 MG PO TABS: 5.0000 mg | ORAL_TABLET | Freq: Every day | ORAL | 6 refills | Status: DC

## 2016-05-01 MED ORDER — METOPROLOL SUCCINATE ER 25 MG PO TB24: 25.0000 mg | ORAL_TABLET | Freq: Every day | ORAL | 6 refills | Status: DC

## 2016-05-01 NOTE — Assessment & Plan Note (Signed)
History of hypertension with blood pressure measures 110/64. She is on amlodipine and Aldactone. Did decrease her amlodipine and half and add low-dose beta blocker because of her palpitations.

## 2016-05-01 NOTE — Assessment & Plan Note (Signed)
History of Palpitations with a unrevealing event monitor and nom LV function. He dos have swelling Port-A-Cath which may be contributory. When I saw her 2 months ago her palpitations had resolved although they've recurred in the last 2 weeks. He apparently is scheduled to have her Port-A-Cath removed. This may improve her symptoms although I believe adding a low-dose beta blocker would also be beneficial. I'm going to decrease her amlodipine from 10-5 mg a day and add low-dose Toprol 25 mg by mouth daily. She will see mid-level provider back in 3 months at me back in 6 months.

## 2016-05-01 NOTE — Patient Instructions (Signed)
Medication Instructions: Decrease Amlodipine to 5 mg daily.  START Metoprolol Succinate (TOPROL XL) 25 mg daily.   Follow-Up: We request that you follow-up in: 3 months with an extender and in 6 months with Dr Andria Rhein will receive a reminder letter in the mail two months in advance. If you don't receive a letter, please call our office to schedule the follow-up appointment.  If you need a refill on your cardiac medications before your next appointment, please call your pharmacy.

## 2016-05-01 NOTE — Progress Notes (Signed)
05/01/2016 Lydia Walsh   07-May-1955  [id number]  Primary Physician Lydia Nims, MD Primary Cardiologist: Lorretta Harp MD Renae Gloss  HPI:   [name] is a 61 year old moderately overweight divorced African American female mother of 3 children and is [place]. She was referred by her PCP for evaluation of palpitations. The left side her in the office -9/18. She's been evaluated for this in the past several years ago. She also has recent diagnosis of endometrial cancer in June of this year. She has no other cardiac risk factors. She does not drink caffeine. I ordered a 2-D echocardiogram which was entirely normal and the monitor that showed only sinus rhythm. Her symptoms palpitations have spontaneously resolved. Several months ago her palpitations have occurred in the last 2 weeks. She did remind me that she has an indwelling Port-A-Cath which may be contributory.   Current Outpatient Prescriptions  Medication Sig Dispense Refill  . allopurinol (ZYLOPRIM) 100 MG tablet TAKE ONE TABLET BY MOUTH ONCE DAILY 90 tablet 1  . amLODipine (NORVASC) 5 MG tablet Take 1 tablet (5 mg total) by mouth daily. 30 tablet 6  . Ferrous Fumarate (HEMOCYTE - 106 MG FE) 324 (106 Fe) MG TABS tablet Take 1 tablet (106 mg of iron total) by mouth daily. On an empty stomach with OJ 30 tablet 3  . ibuprofen (ADVIL,MOTRIN) 200 MG tablet Take 200 mg by mouth as needed.    Marland Kitchen levothyroxine (SYNTHROID, LEVOTHROID) 125 MCG tablet Take 1 tablet (125 mcg total) by mouth daily before breakfast. 90 tablet 3  . omeprazole (PRILOSEC) 20 MG capsule daily.   0  . potassium chloride (K-DUR) 10 MEQ tablet Take 1 tablet (10 mEq total) by mouth daily. 90 tablet 0  . simvastatin (ZOCOR) 20 MG tablet Take 1 tablet (20 mg total) by mouth every evening. 30 tablet 11  . spironolactone (ALDACTONE) 50 MG tablet Take 1 tablet (50 mg total) by mouth daily. 30 tablet 2  . metoprolol succinate  (TOPROL-XL) 25 MG 24 hr tablet Take 1 tablet (25 mg total) by mouth daily. 30 tablet 6   Current Facility-Administered Medications  Medication Dose Route Frequency Provider Last Rate Last Dose  . 0.9 %  sodium chloride infusion  500 mL Intravenous Continuous Gatha Mayer, MD        Allergies  Allergen Reactions  . Hctz [Hydrochlorothiazide] Rash    Social History   Social History  . Marital status: Widowed    Spouse name: N/A  . Number of children: 3  . Years of education: N/A   Occupational History  . retired    Social History Main Topics  . Smoking status: Former Smoker    Years: 2.00    Types: Cigarettes    Quit date: 02/28/1994  . Smokeless tobacco: Never Used  . Alcohol use No  . Drug use: No  . Sexual activity: No   Other Topics Concern  . 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  Cardiovascular: negative for chest pain, dyspnea on exertion, edema, orthopnea, palpitations, paroxysmal nocturnal dyspnea or shortness of breath Dermatological: negative for rash Respiratory: negative for cough or wheezing Urologic: negative for hematuria Abdominal: negative for  nausea, vomiting, diarrhea, bright red blood per rectum, melena, or hematemesis Neurologic: negative for visual changes, syncope, or dizziness All other systems reviewed and are otherwise negative except as noted above.    Blood pressure 110/64, pulse 91, height 5' (1.524 m), weight 167 lb (75.8 kg), last menstrual period 07/16/2014.  General appearance: alert and no distress Neck: no adenopathy, no carotid bruit, no JVD, supple, symmetrical, trachea midline and thyroid not enlarged, symmetric, no tenderness/mass/nodules Lungs: clear to auscultation bilaterally Heart: regular rate and rhythm, S1, S2  normal, no murmur, click, rub or gallop Extremities: extremities normal, atraumatic, no cyanosis or edema  EKG sinus rhythm at 91 without ST or T-wave changes. I personally reviewed this EKG  ASSESSMENT AND PLAN:   Essential hypertension History of hypertension with blood pressure measures 110/64. She is on amlodipine and Aldactone. Did decrease her amlodipine and half and add low-dose beta blocker because of her palpitations.  Palpitations History of Palpitations with a unrevealing event monitor and nom LV function. He dos have swelling Port-A-Cath which may be contributory. When I saw her 2 months ago her palpitations had resolved although they've recurred in the last 2 weeks. He apparently is scheduled to have her Port-A-Cath removed. This may improve her symptoms although I believe adding a low-dose beta blocker would also be beneficial. I'm going to decrease her amlodipine from 10-5 mg a day and add low-dose Toprol 25 mg by mouth daily. She will see mid-level provider back in 3 months at me back in 6 months.      Lorretta Harp MD FACP,FACC,FAHA, [facility] 05/01/2016 8:38 AM

## 2016-05-15 ENCOUNTER — Telehealth: Payer: Self-pay | Admitting: *Deleted

## 2016-05-15 NOTE — Telephone Encounter (Signed)
I have called and moved her appt from May 10th to June  7th. Patient aware of new date/time.

## 2016-05-21 ENCOUNTER — Ambulatory Visit: Payer: BLUE CROSS/BLUE SHIELD | Admitting: Gynecologic Oncology

## 2016-06-01 ENCOUNTER — Encounter: Payer: Self-pay | Admitting: Internal Medicine

## 2016-06-01 ENCOUNTER — Encounter (INDEPENDENT_AMBULATORY_CARE_PROVIDER_SITE_OTHER): Payer: Self-pay

## 2016-06-01 ENCOUNTER — Ambulatory Visit (INDEPENDENT_AMBULATORY_CARE_PROVIDER_SITE_OTHER): Payer: BLUE CROSS/BLUE SHIELD | Admitting: Internal Medicine

## 2016-06-01 VITALS — BP 144/71 | HR 80 | Temp 98.0°F | Ht 60.0 in | Wt 142.8 lb

## 2016-06-01 DIAGNOSIS — M25562 Pain in left knee: Secondary | ICD-10-CM

## 2016-06-01 DIAGNOSIS — G8929 Other chronic pain: Secondary | ICD-10-CM

## 2016-06-01 DIAGNOSIS — G2581 Restless legs syndrome: Secondary | ICD-10-CM | POA: Insufficient documentation

## 2016-06-01 DIAGNOSIS — I10 Essential (primary) hypertension: Secondary | ICD-10-CM | POA: Diagnosis not present

## 2016-06-01 DIAGNOSIS — Z79899 Other long term (current) drug therapy: Secondary | ICD-10-CM

## 2016-06-01 DIAGNOSIS — M25561 Pain in right knee: Secondary | ICD-10-CM | POA: Diagnosis not present

## 2016-06-01 DIAGNOSIS — E876 Hypokalemia: Secondary | ICD-10-CM | POA: Diagnosis not present

## 2016-06-01 DIAGNOSIS — D509 Iron deficiency anemia, unspecified: Secondary | ICD-10-CM | POA: Diagnosis not present

## 2016-06-01 DIAGNOSIS — Z87891 Personal history of nicotine dependence: Secondary | ICD-10-CM | POA: Diagnosis not present

## 2016-06-01 HISTORY — DX: Restless legs syndrome: G25.81

## 2016-06-01 NOTE — Assessment & Plan Note (Signed)
Vitals:   06/01/16 1314  BP: (!) 144/71  Pulse: 80  Temp: 98 F (36.7 C)   BP slightly above goal, did not take her BP meds yet today. Currently on spironolactone 50mg  daily + amlodipine 5 mg daily + metoprolol 25 mg 24hr tablet  Continue current regimen. Check BMET.

## 2016-06-01 NOTE — Assessment & Plan Note (Signed)
Having leg cramps at night time only with improvement after walking. Has hx of iron def anemia, taking iron pill daily.   Will check ferritin level, if still low, will consider doing IV iron tx. If normal, will treat her with Pramipexole 0.125mg  qHS.

## 2016-06-01 NOTE — Patient Instructions (Signed)
Take ibuprofen higher dose (you can take 3 tablets of the 200mg  every 4-6 hours as needed for the pain)  Stop the meloxicam since it's not helping.  Put referral for orthopaedic.  Will check your blood work  Continue other meds.  Follow up in 3 months.

## 2016-06-01 NOTE — Addendum Note (Signed)
Addended by: Dellia Nims on: 06/01/2016 01:55 PM   Modules accepted: Orders

## 2016-06-01 NOTE — Progress Notes (Addendum)
   CC: f/up on HTN,   HPI:  [name] is a 61 y.o. with PMh as listed below is here for HTN f/up and knee pain.   Past Medical History:  Diagnosis Date  . Cancer [facility])    endometrial cancer  . Endometrial polyp   . GERD (gastroesophageal reflux disease)   . Heart murmur   . History of blood transfusion   . History of radiation therapy   . History of uterine fibroid   . Hyperlipidemia   . Hypertension   . Hypothyroidism   . OSA (obstructive sleep apnea)    moderate OSA per study 06-21-2005--  per pt does need cpap anymore never purchased it  . Osteoarthritis   . PMB (postmenopausal bleeding)   . Wears dentures    upper  . Wears glasses    HTN - recently BP meds were changed by her cardiologist as she was having some palpitations thought to be from her med port irritating her heart. Was put on metoprolol for palpitations with improvement of her symptoms.   Currently on spironolactone 50mg  daily + amlodipine 5 mg daily + metoprolol 25 mg 24hr tablet  Knee pain - continues to have knee pain, we decided to do meloxicam 7.5mg  daily on 01/2016 visit. It's not helping so she went back to taking ibuprofen which helps somewhat. Wants a surgical opinion and would like ortho referral today.   Also having some leg crams on calf area, no swelling or redness, mainly at night time when she gets to bed, she has to get up and and walk to get improvement of the sx. Has hx of iron def anemia and taking iron pill every morning.    Review of Systems:   Review of Systems  Constitutional: Negative for chills and fever.  Respiratory: Negative for cough and hemoptysis.   Cardiovascular: Negative for chest pain.  Genitourinary: Negative for dysuria.  Musculoskeletal: Positive for joint pain. Negative for falls.  Neurological: Negative for dizziness and headaches.     Physical Exam:  Vitals:   06/01/16 1314  BP: (!) 144/71  Pulse: 80  Temp: 98 F (36.7 C)  TempSrc: Oral  SpO2:  100%  Weight: 142 lb 12.8 oz (64.8 kg)  Height: 5' (1.524 m)   Physical Exam  Constitutional: She is oriented to person, place, and time. She appears well-developed and well-nourished.  Cardiovascular: Normal rate and regular rhythm.  Exam reveals no gallop and no friction rub.   No murmur heard. Respiratory: Effort normal and breath sounds normal.  Musculoskeletal:  Good ROM on both knees, has pain with hyperextension on both sides. +clicking.   No erythema or tenderness or warmth over the calves. Both legs equal in size.   Neurological: She is alert and oriented to person, place, and time.    Assessment & Plan:   See Encounters Tab for problem based charting.  Patient discussed with Dr. Eppie Gibson

## 2016-06-01 NOTE — Assessment & Plan Note (Signed)
Will check BMET to make sure her K+ is normal now as she is having leg cramps.

## 2016-06-01 NOTE — Assessment & Plan Note (Signed)
Has hx of tricompartmental OA changes on xray 2013. Continues to have pain especially with walking on both knees, also has some calf area cramps which could be from the knee OA. On exam has good ROM but pain with knee hyperextension.  Continue ibuprofen, asked to take higher dose 600mg  q4-6hrs prn Will refer to ortho for surgical opinion.

## 2016-06-02 LAB — BASIC METABOLIC PANEL
BUN/Creatinine Ratio: 28 (ref 12–28)
BUN: 22 mg/dL (ref 8–27)
CALCIUM: 10.2 mg/dL (ref 8.7–10.3)
CHLORIDE: 102 mmol/L (ref 96–106)
CO2: 24 mmol/L (ref 18–29)
Creatinine, Ser: 0.8 mg/dL (ref 0.57–1.00)
GFR calc non Af Amer: 79 mL/min/{1.73_m2} (ref >59)
GFR, EST AFRICAN AMERICAN: 91 mL/min/{1.73_m2} (ref >59)
GLUCOSE: 102 mg/dL — AB (ref 65–99)
Potassium: 5 mmol/L (ref 3.5–5.2)
Sodium: 143 mmol/L (ref 134–144)

## 2016-06-02 LAB — FERRITIN: FERRITIN: 216 ng/mL — AB (ref 15–150)

## 2016-06-02 MED ORDER — PRAMIPEXOLE DIHYDROCHLORIDE 0.125 MG PO TABS: 0.1250 mg | ORAL_TABLET | Freq: Every day | ORAL | 1 refills | Status: DC

## 2016-06-02 NOTE — Addendum Note (Signed)
Addended by: Dellia Nims on: 06/02/2016 08:42 AM   Modules accepted: Orders

## 2016-06-03 NOTE — Progress Notes (Signed)
Case discussed with Dr. Ahmed at the time of the visit.  We reviewed the resident's history and exam and pertinent patient test results.  I agree with the assessment, diagnosis and plan of care documented in the resident's note. 

## 2016-06-09 NOTE — Addendum Note (Signed)
Addended by: Hulan Fray on: 06/09/2016 06:33 PM   Modules accepted: Orders

## 2016-06-18 ENCOUNTER — Ambulatory Visit: Payer: BLUE CROSS/BLUE SHIELD

## 2016-06-18 ENCOUNTER — Other Ambulatory Visit: Payer: Self-pay | Admitting: Emergency Medicine

## 2016-06-18 ENCOUNTER — Encounter: Payer: Self-pay | Admitting: Gynecologic Oncology

## 2016-06-18 ENCOUNTER — Ambulatory Visit: Payer: BLUE CROSS/BLUE SHIELD | Attending: Gynecologic Oncology | Admitting: Gynecologic Oncology

## 2016-06-18 VITALS — BP 120/66 | HR 76 | Temp 98.4°F | Resp 20 | Wt 167.3 lb

## 2016-06-18 DIAGNOSIS — R002 Palpitations: Secondary | ICD-10-CM

## 2016-06-18 DIAGNOSIS — N289 Disorder of kidney and ureter, unspecified: Secondary | ICD-10-CM

## 2016-06-18 DIAGNOSIS — Z95828 Presence of other vascular implants and grafts: Secondary | ICD-10-CM | POA: Diagnosis not present

## 2016-06-18 DIAGNOSIS — Z9889 Other specified postprocedural states: Secondary | ICD-10-CM | POA: Insufficient documentation

## 2016-06-18 DIAGNOSIS — N2889 Other specified disorders of kidney and ureter: Secondary | ICD-10-CM | POA: Diagnosis not present

## 2016-06-18 DIAGNOSIS — Z8542 Personal history of malignant neoplasm of other parts of uterus: Secondary | ICD-10-CM

## 2016-06-18 DIAGNOSIS — K219 Gastro-esophageal reflux disease without esophagitis: Secondary | ICD-10-CM | POA: Diagnosis not present

## 2016-06-18 DIAGNOSIS — E785 Hyperlipidemia, unspecified: Secondary | ICD-10-CM | POA: Diagnosis not present

## 2016-06-18 DIAGNOSIS — C541 Malignant neoplasm of endometrium: Secondary | ICD-10-CM

## 2016-06-18 DIAGNOSIS — Z79899 Other long term (current) drug therapy: Secondary | ICD-10-CM | POA: Insufficient documentation

## 2016-06-18 DIAGNOSIS — G4733 Obstructive sleep apnea (adult) (pediatric): Secondary | ICD-10-CM | POA: Diagnosis not present

## 2016-06-18 DIAGNOSIS — Z87891 Personal history of nicotine dependence: Secondary | ICD-10-CM | POA: Insufficient documentation

## 2016-06-18 DIAGNOSIS — M1990 Unspecified osteoarthritis, unspecified site: Secondary | ICD-10-CM | POA: Insufficient documentation

## 2016-06-18 DIAGNOSIS — Z923 Personal history of irradiation: Secondary | ICD-10-CM | POA: Insufficient documentation

## 2016-06-18 DIAGNOSIS — Z90710 Acquired absence of both cervix and uterus: Secondary | ICD-10-CM | POA: Diagnosis not present

## 2016-06-18 DIAGNOSIS — E669 Obesity, unspecified: Secondary | ICD-10-CM | POA: Diagnosis not present

## 2016-06-18 DIAGNOSIS — Z9221 Personal history of antineoplastic chemotherapy: Secondary | ICD-10-CM | POA: Diagnosis not present

## 2016-06-18 DIAGNOSIS — I10 Essential (primary) hypertension: Secondary | ICD-10-CM | POA: Diagnosis not present

## 2016-06-18 DIAGNOSIS — E039 Hypothyroidism, unspecified: Secondary | ICD-10-CM | POA: Insufficient documentation

## 2016-06-18 MED ORDER — SODIUM CHLORIDE 0.9% FLUSH
10.0000 mL | Freq: Once | INTRAVENOUS | Status: AC
  Administered 2016-06-18: 10 mL
  Filled 2016-06-18: qty 10

## 2016-06-18 MED ORDER — HEPARIN SOD (PORK) LOCK FLUSH 100 UNIT/ML IV SOLN
500.0000 [IU] | Freq: Once | INTRAVENOUS | Status: AC
  Administered 2016-06-18: 500 [IU]
  Filled 2016-06-18: qty 5

## 2016-06-18 NOTE — Patient Instructions (Signed)
Implanted Port Home Guide An implanted port is a type of central line that is placed under the skin. Central lines are used to provide IV access when treatment or nutrition needs to be given through a person's veins. Implanted ports are used for long-term IV access. An implanted port may be placed because:  You need IV medicine that would be irritating to the small veins in your hands or arms.  You need long-term IV medicines, such as antibiotics.  You need IV nutrition for a long period.  You need frequent blood draws for lab tests.  You need dialysis.  Implanted ports are usually placed in the chest area, but they can also be placed in the upper arm, the abdomen, or the leg. An implanted port has two main parts:  Reservoir. The reservoir is round and will appear as a small, raised area under your skin. The reservoir is the part where a needle is inserted to give medicines or draw blood.  Catheter. The catheter is a thin, flexible tube that extends from the reservoir. The catheter is placed into a large vein. Medicine that is inserted into the reservoir goes into the catheter and then into the vein.  How will I care for my incision site? Do not get the incision site wet. Bathe or shower as directed by your health care provider. How is my port accessed? Special steps must be taken to access the port:  Before the port is accessed, a numbing cream can be placed on the skin. This helps numb the skin over the port site.  Your health care provider uses a sterile technique to access the port. ? Your health care provider must put on a mask and sterile gloves. ? The skin over your port is cleaned carefully with an antiseptic and allowed to dry. ? The port is gently pinched between sterile gloves, and a needle is inserted into the port.  Only "non-coring" port needles should be used to access the port. Once the port is accessed, a blood return should be checked. This helps ensure that the port  is in the vein and is not clogged.  If your port needs to remain accessed for a constant infusion, a clear (transparent) bandage will be placed over the needle site. The bandage and needle will need to be changed every week, or as directed by your health care provider.  Keep the bandage covering the needle clean and dry. Do not get it wet. Follow your health care provider's instructions on how to take a shower or bath while the port is accessed.  If your port does not need to stay accessed, no bandage is needed over the port.  What is flushing? Flushing helps keep the port from getting clogged. Follow your health care provider's instructions on how and when to flush the port. Ports are usually flushed with saline solution or a medicine called heparin. The need for flushing will depend on how the port is used.  If the port is used for intermittent medicines or blood draws, the port will need to be flushed: ? After medicines have been given. ? After blood has been drawn. ? As part of routine maintenance.  If a constant infusion is running, the port may not need to be flushed.  How long will my port stay implanted? The port can stay in for as long as your health care provider thinks it is needed. When it is time for the port to come out, surgery will be   done to remove it. The procedure is similar to the one performed when the port was put in. When should I seek immediate medical care? When you have an implanted port, you should seek immediate medical care if:  You notice a bad smell coming from the incision site.  You have swelling, redness, or drainage at the incision site.  You have more swelling or pain at the port site or the surrounding area.  You have a fever that is not controlled with medicine.  This information is not intended to replace advice given to you by your health care provider. Make sure you discuss any questions you have with your health care provider. Document  Released: 12/29/2004 Document Revised: 06/06/2015 Document Reviewed: 09/05/2012 Elsevier Interactive Patient Education  2017 [employer] Inc[employer]  

## 2016-06-18 NOTE — Progress Notes (Signed)
ENDOMETRIAL CANCER FOLLOW-UP VISIT  Assessment:   61 y.o.  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   S/p robotic assisted total hysterectomy, BSO, sentinel lymph node biopsy on 05/02/15 with post-op imaging suggestive of involved lymph nodes. S/p chemoradiation with carboplatin and paclitaxel x 6 cycles and sandwich external beam pelvic radiation (completed 10/24/15) with chemotherapy completed 11/27/15.  Complete clinical response. Plan: 1) Cardiology is concerned that the port is the source of cardiac ectopy.  On medication with persistence of symptoms.  Will remove port.  2) Treatment counseling -discussed that she is disease free on evaluation. Recommend surveillance with exam in 51months.  3)  Left renal lesion - suspicious for malignancy. Evaluated by Urology, follow-up scheduled for 1 year  4)  follow-up: Dr Sondra Come 08/2016 as scheduled and Dr Skeet Latch 11/2016 - pap at this vsit   HPI:  Lydia Walsh is a 61 y.o. year old G3P3. initially seen in consultation on 03/29/15 referred by Dr Radene Knee for grade 2 endometrial cancer.  She then underwent a robotic assisted total hysterectomy, BSO, sentinel lymph node biopsy with Dr Skeet Latch on 9/38/18 without complications.  Her postoperative course was uncomplicated.  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  Postoperative imaging on 05/24/15 showed mild left comon iliac adenopathy. This region had not been sampled surgically. On PET scan on 06/12/15 there was increased uptake at the left common iliac node consistent with metastastatic disease. A 1.8cm left lower pole renal lesion was also identified.  She was diagnosed with clinical stage IIIC disease and was prescribed sandwiched chemotherapy and radiation. She received 6  cycles of carboplatin and paclitaxel between 06/26/15 and 11/27/15 with whole pelvic external beam radiation administered after 3 cycles from July through 10/24/15.   She tolerated treatment well.   On post-treatment CT abdo/pelvis on 12/27/15 showed: No residual lymphadenopathy or other evidence of metastatic endometrial carcinoma. 2. Persistent hypodense mass involving the lower pole of the left kidney, likely enhancing as correlated with previous PET-CT. This remains worrisome for renal cell carcinoma.  Mammogram 2018 wnl  Past Medical History:  Diagnosis Date  . Cancer [facility])    endometrial cancer  . Endometrial polyp   . GERD (gastroesophageal reflux disease)   . Heart murmur   . History of blood transfusion   . History of radiation therapy   . History of uterine fibroid   . Hyperlipidemia   . Hypertension   . Hypothyroidism   . OSA (obstructive sleep apnea)    moderate OSA per study 06-21-2005--  per pt does need cpap anymore never purchased it  . Osteoarthritis   . PMB (postmenopausal bleeding)   . Wears dentures    upper  . Wears glasses    Past Surgical History:  Procedure Laterality Date  . COLONOSCOPY    . DILATATION & CURETTAGE/HYSTEROSCOPY WITH MYOSURE N/A 03/07/2015   Procedure: DILATATION & CURETTAGE/HYSTEROSCOPY WITH MYOSURE (POLYP);  Surgeon: Arvella Nigh, MD;  Location: [facility];  Service: Gynecology;  Laterality: N/A;  . DILATATION & CURRETTAGE/HYSTEROSCOPY WITH RESECTOCOPE  12-13-2002  &  09-26-2004   polyp/  submucosal fibroid  . ESOPHAGOGASTRODUODENOSCOPY    . EXCISION BENIGN RIGHT BREAST MASS  age 69  . KNEE ARTHROSCOPY W/ MENISCECTOMY Bilateral right 04-12-2006//  left 12-29-2006   and Chondroplasty  . ROBOTIC  ASSISTED TOTAL HYSTERECTOMY WITH BILATERAL SALPINGO OOPHERECTOMY Bilateral 05/02/2015   Procedure: XI ROBOTIC ASSISTED TOTAL HYSTERECTOMY WITH BILATERAL SALPINGO OOPHORECTOMY AND SENTINAL LYMPH NODE BIOPSY;  Surgeon: Janie Morning,  MD;  Location: [facility];  Service: Gynecology;  Laterality: Bilateral;  . TRANSTHORACIC ECHOCARDIOGRAM  04-02-2010   normal LV, ef 65%  . TUBAL LIGATION  1980's   Family History  Problem Relation Age of Onset  . Hypertension Mother   . Cirrhosis Father        alcoholic  . Colon cancer Neg Hx   . Stomach cancer Neg Hx   . Rectal cancer Neg Hx   . Esophageal cancer Neg Hx   . Liver cancer Neg Hx    Social History   Social History  . Marital status: Widowed    Spouse name: N/A  . Number of children: 3  . Years of education: N/A   Occupational History  . retired    Social History Main Topics  . Smoking status: Former Smoker    Years: 2.00    Types: Cigarettes    Quit date: 02/28/1994  . Smokeless tobacco: Never Used  . Alcohol use No  . Drug use: No  . Sexual activity: No   Other Topics Concern  . 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 Hctz [Hydrochlorothiazide] Rash   Current Outpatient Prescriptions on File Prior to Visit  Medication Sig Dispense Refill  . allopurinol (ZYLOPRIM) 100 MG tablet TAKE ONE TABLET BY MOUTH ONCE DAILY 90 tablet 1  . amLODipine (NORVASC) 5 MG tablet Take 1 tablet (5 mg total) by mouth daily. 30 tablet 6  . Ferrous Fumarate (HEMOCYTE - 106 MG FE) 324 (106 Fe) MG TABS tablet Take 1 tablet (106 mg of iron total) by mouth daily. On an empty stomach with OJ 30 tablet 3  . ibuprofen (ADVIL,MOTRIN) 200 MG tablet Take 200 mg by mouth as needed.    Marland Kitchen levothyroxine (SYNTHROID, LEVOTHROID) 125 MCG tablet Take 1 tablet (125 mcg total) by mouth daily before breakfast. 90 tablet 3  . metoprolol succinate (TOPROL-XL) 25 MG 24 hr tablet Take 1 tablet (25 mg total) by mouth daily. 30 tablet 6  . omeprazole (PRILOSEC) 20 MG capsule daily.   0  . potassium chloride (K-DUR) 10 MEQ tablet Take 1 tablet (10 mEq total)  by mouth daily. 90 tablet 0  . pramipexole (MIRAPEX) 0.125 MG tablet Take 1 tablet (0.125 mg total) by mouth at bedtime. 30 tablet 1  . simvastatin (ZOCOR) 20 MG tablet Take 1 tablet (20 mg total) by mouth every evening. 30 tablet 11  . spironolactone (ALDACTONE) 50 MG tablet Take 1 tablet (50 mg total) by mouth daily. 30 tablet 2  . [DISCONTINUED] esomeprazole (NEXIUM) 20 MG capsule Take 2 capsules (40 mg total) by mouth daily before breakfast. 30 capsule 3   Current Facility-Administered Medications on File Prior to Visit  Medication Dose Route Frequency Provider Last Rate Last Dose  . 0.9 %  sodium chloride infusion  500 mL Intravenous Continuous Gatha Mayer, MD         Review of systems: Constitutional:  She has no weight gain or weight loss. She has no fever or chills. Eyes: No blurred vision Ears, Nose, Mouth, Throat: No dizziness, headaches or changes in hearing. No  mouth sores. Cardiovascular: No chest pain, palpitations or edema. Respiratory:  No shortness of breath, wheezing or cough Gastrointestinal: She has normal bowel movements without diarrhea or constipation. She denies any nausea or vomiting. She denies blood in her stool or heart burn. Genitourinary:  She denies pelvic pain, pelvic pressure or changes in her urinary function. She has no hematuria, dysuria, or incontinence. She has no  vaginal bleeding or vaginal discharge Musculoskeletal: Denies muscle weakness or joint pains.  Skin:  She has no skin changes, rashes or itching Neurological:  Denies dizziness or headaches. No neuropathy, no numbness or tingling. Psychiatric:  She denies depression or anxiety. Hematologic/Lymphatic:   No easy bruising or bleeding   Physical Exam: Last menstrual period 07/16/2014. BP 120/66 (BP Location: Left Arm, Patient Position: Sitting)   Pulse 76   Temp 98.4 F (36.9 C) (Oral)   Resp 20   Wt 167 lb 4.8 oz (75.9 kg)   LMP 07/16/2014   BMI 32.67 kg/m  Wt Readings from Last 3  Encounters:  06/18/16 167 lb 4.8 oz (75.9 kg)  06/01/16 142 lb 12.8 oz (64.8 kg)  05/01/16 167 lb (75.8 kg)   General: Well dressed, well nourished in no apparent distress.   HEENT:  Normocephalic and atraumatic, no lesions.  Sclerae anicteric. Lungs:  Clear to auscultation bilaterally.  No wheezes. Cardiovascular:  Regular rate and rhythm.   Abdomen:  Soft, nontender, nondistended.  No palpable masses.  No hepatosplenomegaly.  No ascites. Normal bowel sounds.  No hernias.  Incisions are well healed.  No masses or hernia or tenderness at port sites Genitourinary: Normal EGBUS  Vaginal cuff intact.  No bleeding or discharge.  No cul de sac fullness, vaginal agglutination present and was disrupted on digital examination, no nodularity proximal to agglutination Rectal: Good tone no masses no cul-de-sac nodularity  Extremities: No cyanosis, clubbing or edema.  No calf tenderness or erythema.  Psychiatric: Mood and affect are appropriate. Neurological: Awake, alert and oriented x 3. Musculoskeletal: No pain, normal strength and gait

## 2016-06-18 NOTE — Patient Instructions (Signed)
You will receive a phone call from the hospital to arrange an appointment for you to come in for port-a-cath removal.  Plan to follow up in November or sooner if needed.  Please call for any needs or concerns.

## 2016-06-19 ENCOUNTER — Other Ambulatory Visit: Payer: Self-pay | Admitting: Internal Medicine

## 2016-06-19 ENCOUNTER — Encounter: Payer: Self-pay | Admitting: *Deleted

## 2016-06-19 DIAGNOSIS — E876 Hypokalemia: Secondary | ICD-10-CM

## 2016-06-30 ENCOUNTER — Other Ambulatory Visit: Payer: Self-pay | Admitting: Radiology

## 2016-07-01 ENCOUNTER — Telehealth (HOSPITAL_COMMUNITY): Payer: Self-pay | Admitting: Radiology

## 2016-07-01 ENCOUNTER — Other Ambulatory Visit: Payer: Self-pay | Admitting: Radiology

## 2016-07-01 NOTE — Telephone Encounter (Signed)
Rec'd call from patient concerned about having her port removed tomorrow while she does not feel well.  Patient states she had a fever, cough, sore throat yesterday (does not know exact temp).  Does not feel like she has a fever today. I spoke with Dr. Barbie Banner who recommended waiting until symptoms resolve before removing portacath.  Patient instructed to call [facility] when she feels better, and we will reschedule port removal at that time.

## 2016-07-02 ENCOUNTER — Ambulatory Visit (HOSPITAL_COMMUNITY): Payer: BLUE CROSS/BLUE SHIELD

## 2016-07-02 ENCOUNTER — Other Ambulatory Visit: Payer: Self-pay | Admitting: Internal Medicine

## 2016-07-02 ENCOUNTER — Inpatient Hospital Stay (HOSPITAL_COMMUNITY): Admission: RE | Admit: 2016-07-02 | Payer: BLUE CROSS/BLUE SHIELD | Source: Ambulatory Visit

## 2016-07-02 DIAGNOSIS — I10 Essential (primary) hypertension: Secondary | ICD-10-CM

## 2016-07-06 ENCOUNTER — Ambulatory Visit (INDEPENDENT_AMBULATORY_CARE_PROVIDER_SITE_OTHER): Payer: BLUE CROSS/BLUE SHIELD | Admitting: Internal Medicine

## 2016-07-06 VITALS — BP 133/57 | HR 72 | Temp 98.1°F | Ht 60.0 in | Wt 170.5 lb

## 2016-07-06 DIAGNOSIS — J069 Acute upper respiratory infection, unspecified: Secondary | ICD-10-CM | POA: Diagnosis not present

## 2016-07-06 DIAGNOSIS — Z9221 Personal history of antineoplastic chemotherapy: Secondary | ICD-10-CM

## 2016-07-06 DIAGNOSIS — Z95828 Presence of other vascular implants and grafts: Secondary | ICD-10-CM

## 2016-07-06 DIAGNOSIS — Z8542 Personal history of malignant neoplasm of other parts of uterus: Secondary | ICD-10-CM | POA: Diagnosis not present

## 2016-07-06 MED ORDER — HYDROCOD POLST-CPM POLST ER 10-8 MG/5ML PO SUER: 5.0000 mL | Freq: Two times a day (BID) | ORAL | 0 refills | Status: DC | PRN

## 2016-07-06 MED ORDER — FLUTICASONE PROPIONATE 50 MCG/ACT NA SUSP: 2.0000 | Freq: Every day | NASAL | 1 refills | Status: DC

## 2016-07-06 NOTE — Assessment & Plan Note (Signed)
She should not be at significantly increased risk of complication during catheter removal with a resolving URI, which I believe was the cause for her fever last week. If she continues to improve as expected from a self limited infection this procedure can be scheduled next week.

## 2016-07-06 NOTE — Progress Notes (Signed)
   CC: Here for evaluation of fever  HPI:  [name] is a 61 y.o. woman who was scheduled for port-a-cath removal last week but was cancelled due to feeling feverish and sick beforehand.   See problem based assessment and plan below for additional details  Past Medical History:  Diagnosis Date  . Cancer [facility])    endometrial cancer  . Endometrial polyp   . GERD (gastroesophageal reflux disease)   . Heart murmur   . History of blood transfusion   . History of radiation therapy   . History of uterine fibroid   . Hyperlipidemia   . Hypertension   . Hypothyroidism   . OSA (obstructive sleep apnea)    moderate OSA per study 06-21-2005--  per pt does need cpap anymore never purchased it  . Osteoarthritis   . PMB (postmenopausal bleeding)   . Wears dentures    upper  . Wears glasses     Review of Systems:  Review of Systems  Constitutional: Positive for fever.  HENT: Negative for hearing loss.   Eyes: Negative for blurred vision.  Respiratory: Positive for cough and sputum production. Negative for shortness of breath.   Gastrointestinal: Negative for diarrhea.  Musculoskeletal: Negative for myalgias.  Skin: Negative for rash.  Neurological: Negative for dizziness and headaches.    Physical Exam: Physical Exam  Constitutional: She is well-developed, well-nourished, and in no distress.  HENT:  Mouth/Throat: Oropharynx is clear and moist. No oropharyngeal exudate.  Eyes: Conjunctivae are normal.  Cardiovascular: Normal rate and regular rhythm.   Pulmonary/Chest: Effort normal and breath sounds normal.  Right upper chest portacath in place without overlying tenderness or erythema  Musculoskeletal: She exhibits no edema.  Lymphadenopathy:    She has no cervical adenopathy.  Skin: Skin is warm and dry. No rash noted.    Vitals:   07/06/16 0918  BP: (!) 133/57  Pulse: 72  Temp: 98.1 F (36.7 C)  TempSrc: Oral  SpO2: 100%  Weight: 170 lb 8 oz (77.3 kg)    Height: 5' (1.524 m)     Assessment & Plan:   See Encounters Tab for problem based charting.  Patient discussed with Dr. Daryll Drown

## 2016-07-06 NOTE — Assessment & Plan Note (Addendum)
HPI: She has upper airway congestion, productive cough, sore throat since 4 days ago with associated subjective fevers. She never checked her temperature at home while feeling febrile. She did not have her port catheter removed last week due to this until she was seen by her doctor and cleared to undergo this. Prior to these symptoms she was feeling well. She is not taking OTC medications for her symptoms at this time.  A: Acute viral upper respiratory tract infection She has no red flags for a severe or complicated infection at this time. She is not neutropenic per last CBC and completed her previous chemotherapy treatments for endometrial cancer in 11/2015. I see no evidence of local infection or inflammation at the catheter site itself.  P: Flonase 2 sprays each nsotril daily Tussionex q12hrs PRN Recommended to call clinic if symptoms not improving by next week

## 2016-07-06 NOTE — Patient Instructions (Addendum)
It was a pleasure to see you today Lydia Walsh.  I suspect your symptoms are due to an upper respiratory tract infection. These are MOST OFTEN viral infections that will improve on their own in about 5-10 days. In some people a more severe infection can result from this.  This type of illness does not pose a danger for having a port catheter removed.  For now I recommend using a nasal steroid spray and cough medicine as needed while waiting for your symptoms to improve. If you are still feeling poorly or having fevers after the end of the week or next week please call us back.

## 2016-07-07 ENCOUNTER — Other Ambulatory Visit: Payer: Self-pay | Admitting: Pharmacist

## 2016-07-07 DIAGNOSIS — I10 Essential (primary) hypertension: Secondary | ICD-10-CM

## 2016-07-07 MED ORDER — SPIRONOLACTONE 25 MG PO TABS: 50.0000 mg | ORAL_TABLET | Freq: Every day | ORAL | 2 refills | Status: DC

## 2016-07-07 NOTE — Progress Notes (Signed)
Internal Medicine Clinic Attending  Case discussed with Dr. Rice at the time of the visit.  We reviewed the resident's history and exam and pertinent patient test results.  I agree with the assessment, diagnosis, and plan of care documented in the resident's note.  

## 2016-07-07 NOTE — Progress Notes (Signed)
Patient interchanged to 25 mg x 2 spironolactone daily due to 50 mg being on backorder. Patient verbalized understanding.

## 2016-07-16 ENCOUNTER — Other Ambulatory Visit: Payer: Self-pay | Admitting: Radiology

## 2016-07-17 ENCOUNTER — Ambulatory Visit (HOSPITAL_COMMUNITY)
Admission: RE | Admit: 2016-07-17 | Discharge: 2016-07-17 | Disposition: A | Discharge status: Home / Self Care | Payer: BLUE CROSS/BLUE SHIELD | Source: Ambulatory Visit | Attending: Gynecologic Oncology | Admitting: Gynecologic Oncology

## 2016-07-17 ENCOUNTER — Encounter (HOSPITAL_COMMUNITY): Payer: Self-pay

## 2016-07-17 DIAGNOSIS — I10 Essential (primary) hypertension: Secondary | ICD-10-CM | POA: Insufficient documentation

## 2016-07-17 DIAGNOSIS — Z8542 Personal history of malignant neoplasm of other parts of uterus: Secondary | ICD-10-CM | POA: Insufficient documentation

## 2016-07-17 DIAGNOSIS — Z7951 Long term (current) use of inhaled steroids: Secondary | ICD-10-CM | POA: Insufficient documentation

## 2016-07-17 DIAGNOSIS — G4733 Obstructive sleep apnea (adult) (pediatric): Secondary | ICD-10-CM | POA: Diagnosis not present

## 2016-07-17 DIAGNOSIS — Z79899 Other long term (current) drug therapy: Secondary | ICD-10-CM | POA: Diagnosis not present

## 2016-07-17 DIAGNOSIS — E039 Hypothyroidism, unspecified: Secondary | ICD-10-CM | POA: Diagnosis not present

## 2016-07-17 DIAGNOSIS — D649 Anemia, unspecified: Secondary | ICD-10-CM | POA: Insufficient documentation

## 2016-07-17 DIAGNOSIS — Z923 Personal history of irradiation: Secondary | ICD-10-CM | POA: Insufficient documentation

## 2016-07-17 DIAGNOSIS — Z452 Encounter for adjustment and management of vascular access device: Secondary | ICD-10-CM | POA: Diagnosis not present

## 2016-07-17 DIAGNOSIS — K219 Gastro-esophageal reflux disease without esophagitis: Secondary | ICD-10-CM | POA: Diagnosis not present

## 2016-07-17 DIAGNOSIS — Z9221 Personal history of antineoplastic chemotherapy: Secondary | ICD-10-CM | POA: Diagnosis not present

## 2016-07-17 DIAGNOSIS — E785 Hyperlipidemia, unspecified: Secondary | ICD-10-CM | POA: Diagnosis not present

## 2016-07-17 DIAGNOSIS — Z95828 Presence of other vascular implants and grafts: Secondary | ICD-10-CM

## 2016-07-17 HISTORY — DX: Personal history of antineoplastic chemotherapy: Z92.21

## 2016-07-17 HISTORY — PX: IR REMOVAL TUN ACCESS W/ PORT W/O FL MOD SED: IMG2290

## 2016-07-17 HISTORY — DX: Palpitations: R00.2

## 2016-07-17 LAB — CBC WITH DIFFERENTIAL/PLATELET
BASOS ABS: 0 10*3/uL (ref 0.0–0.1)
Basophils Relative: 0 %
Eosinophils Absolute: 0.1 10*3/uL (ref 0.0–0.7)
Eosinophils Relative: 2 %
HEMATOCRIT: 35.1 % — AB (ref 36.0–46.0)
HEMOGLOBIN: 11.9 g/dL — AB (ref 12.0–15.0)
LYMPHS ABS: 0.9 10*3/uL (ref 0.7–4.0)
LYMPHS PCT: 17 %
MCH: 30.3 pg (ref 26.0–34.0)
MCHC: 33.9 g/dL (ref 30.0–36.0)
MCV: 89.3 fL (ref 78.0–100.0)
Monocytes Absolute: 0.3 10*3/uL (ref 0.1–1.0)
Monocytes Relative: 6 %
NEUTROS ABS: 3.8 10*3/uL (ref 1.7–7.7)
NEUTROS PCT: 75 %
PLATELETS: 205 10*3/uL (ref 150–400)
RBC: 3.93 MIL/uL (ref 3.87–5.11)
RDW: 13.3 % (ref 11.5–15.5)
WBC: 5.1 10*3/uL (ref 4.0–10.5)

## 2016-07-17 LAB — BASIC METABOLIC PANEL
ANION GAP: 10 (ref 5–15)
BUN: 24 mg/dL — ABNORMAL HIGH (ref 6–20)
CO2: 24 mmol/L (ref 22–32)
Calcium: 9.4 mg/dL (ref 8.9–10.3)
Chloride: 107 mmol/L (ref 101–111)
Creatinine, Ser: 0.9 mg/dL (ref 0.44–1.00)
GFR calc Af Amer: >60 mL/min (ref >60)
Glucose, Bld: 126 mg/dL — ABNORMAL HIGH (ref 65–99)
POTASSIUM: 3.8 mmol/L (ref 3.5–5.1)
SODIUM: 141 mmol/L (ref 135–145)

## 2016-07-17 LAB — PROTIME-INR
INR: 0.95
Prothrombin Time: 12.7 seconds (ref 11.4–15.2)

## 2016-07-17 MED ORDER — CEFAZOLIN SODIUM-DEXTROSE 2-4 GM/100ML-% IV SOLN
INTRAVENOUS | Status: AC
  Administered 2016-07-17: 2 g via INTRAVENOUS
  Filled 2016-07-17: qty 100

## 2016-07-17 MED ORDER — MIDAZOLAM HCL 2 MG/2ML IJ SOLN
INTRAMUSCULAR | Status: AC
  Filled 2016-07-17: qty 4

## 2016-07-17 MED ORDER — MIDAZOLAM HCL 2 MG/2ML IJ SOLN
INTRAMUSCULAR | Status: AC | PRN
  Administered 2016-07-17: 1 mg via INTRAVENOUS

## 2016-07-17 MED ORDER — CEFAZOLIN SODIUM-DEXTROSE 2-4 GM/100ML-% IV SOLN
2.0000 g | INTRAVENOUS | Status: AC
  Administered 2016-07-17: 2 g via INTRAVENOUS

## 2016-07-17 MED ORDER — LIDOCAINE HCL 1 % IJ SOLN
INTRAMUSCULAR | Status: AC
  Filled 2016-07-17: qty 20

## 2016-07-17 MED ORDER — SODIUM CHLORIDE 0.9 % IV SOLN
INTRAVENOUS | Status: DC
  Administered 2016-07-17: 08:00:00 via INTRAVENOUS

## 2016-07-17 MED ORDER — FENTANYL CITRATE (PF) 100 MCG/2ML IJ SOLN
INTRAMUSCULAR | Status: AC
  Filled 2016-07-17: qty 4

## 2016-07-17 MED ORDER — FENTANYL CITRATE (PF) 100 MCG/2ML IJ SOLN
INTRAMUSCULAR | Status: AC | PRN
  Administered 2016-07-17: 50 ug via INTRAVENOUS

## 2016-07-17 MED ORDER — LIDOCAINE HCL 1 % IJ SOLN
INTRAMUSCULAR | Status: AC | PRN
  Administered 2016-07-17: 10 mL

## 2016-07-17 NOTE — Procedures (Signed)
Successful removal of portacath.  Minimal blood loss and no immediate complication.  See full report in Imaging.

## 2016-07-17 NOTE — Discharge Instructions (Signed)
Moderate Conscious Sedation, Adult, Care After °These instructions provide you with information about caring for yourself after your procedure. Your health care provider may also give you more specific instructions. Your treatment has been planned according to current medical practices, but problems sometimes occur. Call your health care provider if you have any problems or questions after your procedure. °What can I expect after the procedure? °After your procedure, it is common: °· To feel sleepy for several hours. °· To feel clumsy and have poor balance for several hours. °· To have poor judgment for several hours. °· To vomit if you eat too soon. ° °Follow these instructions at home: °For at least 24 hours after the procedure: ° °· Do not: °? Participate in activities where you could fall or become injured. °? Drive. °? Use heavy machinery. °? Drink alcohol. °? Take sleeping pills or medicines that cause drowsiness. °? Make important decisions or sign legal documents. °? Take care of children on your own. °· Rest. °Eating and drinking °· Follow the diet recommended by your health care provider. °· If you vomit: °? Drink water, juice, or soup when you can drink without vomiting. °? Make sure you have little or no nausea before eating solid foods. °General instructions °· Have a responsible adult stay with you until you are awake and alert. °· Take over-the-counter and prescription medicines only as told by your health care provider. °· If you smoke, do not smoke without supervision. °· Keep all follow-up visits as told by your health care provider. This is important. °Contact a health care provider if: °· You keep feeling nauseous or you keep vomiting. °· You feel light-headed. °· You develop a rash. °· You have a fever. °Get help right away if: °· You have trouble breathing. °This information is not intended to replace advice given to you by your health care provider. Make sure you discuss any questions you have  with your health care provider. °Document Released: 10/19/2012 Document Revised: 06/03/2015 Document Reviewed: 04/20/2015 °Elsevier Interactive Patient Education © 2018 [employer] ° ° ° ° °Implanted Port Removal, Care After °Refer to this sheet in the next few weeks. These instructions provide you with information about caring for yourself after your procedure. Your health care provider may also give you more specific instructions. Your treatment has been planned according to current medical practices, but problems sometimes occur. Call your health care provider if you have any problems or questions after your procedure. °What can I expect after the procedure? °After the procedure, it is common to have: °· Soreness or pain near your incision. °· Some swelling or bruising near your incision. ° °Follow these instructions at home: °Medicines °· Take over-the-counter and prescription medicines only as told by your health care provider. °· If you were prescribed an antibiotic medicine, take it as told by your health care provider. Do not stop taking the antibiotic even if you start to feel better. °Bathing °· Do not take baths, swim, or use a hot tub until your health care provider approves. Ask your health care provider if you can take showers. You may only be allowed to take sponge baths for bathing. °Incision care °· Follow instructions from your health care provider about how to take care of your incision. Make sure you: °? Wash your hands with soap and water before you change your bandage (dressing). If soap and water are not available, use hand sanitizer. °? Change your dressing as told by your health care provider. °?   Keep your dressing dry. °? Leave stitches (sutures), skin glue, or adhesive strips in place. These skin closures may need to stay in place for 2 weeks or longer. If adhesive strip edges start to loosen and curl up, you may trim the loose edges. Do not remove adhesive strips completely unless your  health care provider tells you to do that. °· Check your incision area every day for signs of infection. Check for: °? More redness, swelling, or pain. °? More fluid or blood. °? Warmth. °? Pus or a bad smell. °Driving °· If you received a sedative, do not drive for 24 hours after the procedure. °· If you did not receive a sedative, ask your health care provider when it is safe to drive. °Activity °· Return to your normal activities as told by your health care provider. Ask your health care provider what activities are safe for you. °· Until your health care provider says it is safe: °? Do not lift anything that is heavier than 10 lb (4.5 kg). °? Do not do activities that involve lifting your arms over your head. °General instructions °· Do not use any tobacco products, such as cigarettes, chewing tobacco, and e-cigarettes. Tobacco can delay healing. If you need help quitting, ask your health care provider. °· Keep all follow-up visits as told by your health care provider. This is important. °Contact a health care provider if: °· You have more redness, swelling, or pain around your incision. °· You have more fluid or blood coming from your incision. °· Your incision feels warm to the touch. °· You have pus or a bad smell coming from your incision. °· You have a fever. °· You have pain that is not relieved by your pain medicine. °Get help right away if: °· You have chest pain. °· You have difficulty breathing. °This information is not intended to replace advice given to you by your health care provider. Make sure you discuss any questions you have with your health care provider. °Document Released: 12/10/2014 Document Revised: 06/06/2015 Document Reviewed: 10/03/2014 °Elsevier Interactive Patient Education © 2018 [employer] ° °

## 2016-07-17 NOTE — H&P (Signed)
Referring Physician(s): Guy Sandifer  Supervising Physician: Markus Daft  Patient Status:  [facility]  Chief Complaint:  "I'm getting my port out"  Subjective: Patient familiar to IR service from prior right chest wall Port-A-Cath on 07/15/15. She has a history of endometrial cancer and is status post surgery and chemoradiation. She has had a complete clinical response and is no longer receiving therapy. She presents today for Port-A-Cath removal. She denies fever, headache, chest pain, dyspnea, cough, abdominal/back pain, nausea, vomiting or abnormal bleeding. Past Medical History:  Diagnosis Date  . Anemia   . Cancer [facility])    endometrial cancer  . Endometrial polyp   . GERD (gastroesophageal reflux disease)   . Heart murmur   . Heart palpitations   . History of blood transfusion   . History of chemotherapy   . History of radiation therapy   . History of uterine fibroid   . Hyperlipidemia   . Hypertension   . Hypothyroidism   . OSA (obstructive sleep apnea)    moderate OSA per study 06-21-2005--  per pt does need cpap anymore never purchased it  . Osteoarthritis   . PMB (postmenopausal bleeding)   . Wears dentures    upper  . Wears glasses    Past Surgical History:  Procedure Laterality Date  . COLONOSCOPY    . DILATATION & CURETTAGE/HYSTEROSCOPY WITH MYOSURE N/A 03/07/2015   Procedure: DILATATION & CURETTAGE/HYSTEROSCOPY WITH MYOSURE (POLYP);  Surgeon: Arvella Nigh, MD;  Location: [facility];  Service: Gynecology;  Laterality: N/A;  . DILATATION & CURRETTAGE/HYSTEROSCOPY WITH RESECTOCOPE  12-13-2002  &  09-26-2004   polyp/  submucosal fibroid  . ESOPHAGOGASTRODUODENOSCOPY    . EXCISION BENIGN RIGHT BREAST MASS  age 60  . KNEE ARTHROSCOPY W/ MENISCECTOMY Bilateral right 04-12-2006//  left 12-29-2006   and Chondroplasty  . port placement    . ROBOTIC ASSISTED TOTAL HYSTERECTOMY WITH BILATERAL SALPINGO OOPHERECTOMY Bilateral 05/02/2015   Procedure: XI ROBOTIC  ASSISTED TOTAL HYSTERECTOMY WITH BILATERAL SALPINGO OOPHORECTOMY AND SENTINAL LYMPH NODE BIOPSY;  Surgeon: Janie Morning, MD;  Location: [facility];  Service: Gynecology;  Laterality: Bilateral;  . TRANSTHORACIC ECHOCARDIOGRAM  04-02-2010   normal LV, ef 65%  . TUBAL LIGATION  1980's      Allergies: Hctz [hydrochlorothiazide]  Medications: Prior to Admission medications   Medication Sig Start Date End Date Taking? Authorizing Provider  allopurinol (ZYLOPRIM) 100 MG tablet TAKE ONE TABLET BY MOUTH ONCE DAILY 07/05/16  Yes Ahmed, Chesley Mires, MD  amLODipine (NORVASC) 5 MG tablet Take 1 tablet (5 mg total) by mouth daily. 05/01/16  Yes Lorretta Harp, MD  chlorpheniramine-HYDROcodone [facility] PENNKINETIC ER) 10-8 MG/5ML SUER Take 5 mLs by mouth every 12 (twelve) hours as needed for cough. 07/06/16  Yes Rice, Resa Miner, MD  Ferrous Fumarate (HEMOCYTE - 106 MG FE) 324 (106 Fe) MG TABS tablet Take 1 tablet (106 mg of iron total) by mouth daily. On an empty stomach with OJ 04/23/16  Yes Gorsuch, Ni, MD  fluticasone (FLONASE) 50 MCG/ACT nasal spray Place 2 sprays into both nostrils daily. 07/06/16  Yes Rice, Resa Miner, MD  ibuprofen (ADVIL,MOTRIN) 200 MG tablet Take 200 mg by mouth as needed.   Yes [provider]  levothyroxine (SYNTHROID, LEVOTHROID) 125 MCG tablet Take 1 tablet (125 mcg total) by mouth daily before breakfast. 10/28/15  Yes Oval Linsey, MD  metoprolol succinate (TOPROL-XL) 25 MG 24 hr tablet Take 1 tablet (25 mg total) by mouth daily. 05/01/16  Yes Gwenlyn Found,  Pearletha Forge, MD  omeprazole (PRILOSEC) 20 MG capsule daily.  03/30/16  Yes [provider]  potassium chloride (K-DUR) 10 MEQ tablet TAKE 1 TABLET BY MOUTH ONCE DAILY 06/19/16  Yes Ahmed, Chesley Mires, MD  pramipexole (MIRAPEX) 0.125 MG tablet Take 1 tablet (0.125 mg total) by mouth at bedtime. 06/02/16 06/02/17 Yes Ahmed, Chesley Mires, MD  simvastatin (ZOCOR) 20 MG tablet Take 1 tablet (20 mg total) by mouth every evening.  09/04/15  Yes Ahmed, Chesley Mires, MD  spironolactone (ALDACTONE) 25 MG tablet Take 2 tablets (50 mg total) by mouth daily. D/c previous spironolactone prescription 07/07/16  Yes Bartholomew Crews, MD     Vital Signs: BP 130/70   Pulse 81   Temp 98.4 F (36.9 C) (Oral)   Resp 16   Ht 5' (1.524 m)   Wt 174 lb (78.9 kg)   LMP 07/16/2014   SpO2 100%   BMI 33.98 kg/m   Physical Exam awake, alert. Chest clear to auscultation bilaterally. Clean, intact right chest wall Port-A-Cath. Heart with regular rate and rhythm, soft murmur. Abdomen soft, positive bowel sounds, nontender. Lower extremities with full range of motion.  Imaging: No results found.  Labs:  CBC:  Recent Labs  11/25/15 1004 12/12/15 1244 01/23/16 0903 07/17/16 0752  WBC 3.7* 2.1* 4.4 5.1  HGB 10.7* 9.4* 11.1* 11.9*  HCT 31.2* 27.4* 33.0* 35.1*  PLT 133* 84* 182 205    COAGS:  Recent Labs  07/17/16 0752  INR 0.95    BMP:  Recent Labs  12/30/15 1259 01/23/16 0903 06/01/16 1357 07/17/16 0752  NA 141 141 143 141  K 3.8 3.9 5.0 3.8  CL  --   --  102 107  CO2 23 25 24 24   GLUCOSE 99 121 102* 126*  BUN 20.6 18.6 22 24*  CALCIUM 10.3 9.9 10.2 9.4  CREATININE 0.7 0.8 0.80 0.90  GFRNONAA  --   --  79 >60  GFRAA  --   --  91 >60    LIVER FUNCTION TESTS:  Recent Labs  11/06/15 0902 11/25/15 1004 12/12/15 1244 01/23/16 0903  BILITOT 0.41 0.45 0.40 0.57  AST 28 24 23 17   ALT 38 32 25 18  ALKPHOS 136 122 112 88  PROT 8.0 7.4 6.8 7.1  ALBUMIN 3.9 3.7 3.4* 3.8    Assessment and Plan: Pt with history of endometrial cancer ; status post surgery and chemoradiation. She has had a complete clinical response and is no longer receiving therapy. She presents today for Port-A-Cath removal. Details/risks of procedure, including but not limited to, internal bleeding, infection, injury to adjacent structures, discussed with patient with her understanding and consent. Electronically Signed: D. Rowe Robert,  PA-C 07/17/2016, 9:03 AM   I spent a total of 20 minutes at the the patient's bedside AND on the patient's hospital floor or unit, greater than 50% of which was counseling/coordinating care for Port-A-Cath removal

## 2016-07-20 ENCOUNTER — Other Ambulatory Visit: Payer: Self-pay | Admitting: Hematology and Oncology

## 2016-07-20 DIAGNOSIS — C541 Malignant neoplasm of endometrium: Secondary | ICD-10-CM

## 2016-07-21 ENCOUNTER — Other Ambulatory Visit (HOSPITAL_BASED_OUTPATIENT_CLINIC_OR_DEPARTMENT_OTHER): Payer: BLUE CROSS/BLUE SHIELD

## 2016-07-21 ENCOUNTER — Ambulatory Visit: Payer: BLUE CROSS/BLUE SHIELD

## 2016-07-21 ENCOUNTER — Ambulatory Visit (HOSPITAL_BASED_OUTPATIENT_CLINIC_OR_DEPARTMENT_OTHER): Payer: BLUE CROSS/BLUE SHIELD | Admitting: Hematology and Oncology

## 2016-07-21 ENCOUNTER — Encounter: Payer: Self-pay | Admitting: *Deleted

## 2016-07-21 ENCOUNTER — Encounter: Payer: Self-pay | Admitting: Hematology and Oncology

## 2016-07-21 DIAGNOSIS — C541 Malignant neoplasm of endometrium: Secondary | ICD-10-CM

## 2016-07-21 DIAGNOSIS — N2889 Other specified disorders of kidney and ureter: Secondary | ICD-10-CM | POA: Diagnosis not present

## 2016-07-21 DIAGNOSIS — D500 Iron deficiency anemia secondary to blood loss (chronic): Secondary | ICD-10-CM | POA: Diagnosis not present

## 2016-07-21 LAB — CBC WITH DIFFERENTIAL/PLATELET
BASO%: 0.4 % (ref 0.0–2.0)
Basophils Absolute: 0 10*3/uL (ref 0.0–0.1)
EOS%: 1.8 % (ref 0.0–7.0)
Eosinophils Absolute: 0.1 10*3/uL (ref 0.0–0.5)
HCT: 38.5 % (ref 34.8–46.6)
HEMOGLOBIN: 12.8 g/dL (ref 11.6–15.9)
LYMPH%: 13.9 % — AB (ref 14.0–49.7)
MCH: 30.3 pg (ref 25.1–34.0)
MCHC: 33.2 g/dL (ref 31.5–36.0)
MCV: 91.3 fL (ref 79.5–101.0)
MONO#: 0.2 10*3/uL (ref 0.1–0.9)
MONO%: 5 % (ref 0.0–14.0)
NEUT%: 78.9 % — ABNORMAL HIGH (ref 38.4–76.8)
NEUTROS ABS: 3.8 10*3/uL (ref 1.5–6.5)
Platelets: 190 10*3/uL (ref 145–400)
RBC: 4.22 10*6/uL (ref 3.70–5.45)
RDW: 13.8 % (ref 11.2–14.5)
WBC: 4.8 10*3/uL (ref 3.9–10.3)
lymph#: 0.7 10*3/uL — ABNORMAL LOW (ref 0.9–3.3)

## 2016-07-21 LAB — COMPREHENSIVE METABOLIC PANEL
ALBUMIN: 4 g/dL (ref 3.5–5.0)
ALT: 18 U/L (ref 0–55)
AST: 16 U/L (ref 5–34)
Alkaline Phosphatase: 108 U/L (ref 40–150)
Anion Gap: 9 mEq/L (ref 3–11)
BILIRUBIN TOTAL: 0.7 mg/dL (ref 0.20–1.20)
BUN: 16.2 mg/dL (ref 7.0–26.0)
CO2: 24 mEq/L (ref 22–29)
Calcium: 10.4 mg/dL (ref 8.4–10.4)
Chloride: 107 mEq/L (ref 98–109)
Creatinine: 0.8 mg/dL (ref 0.6–1.1)
EGFR: 90 mL/min/{1.73_m2} — AB (ref >90)
GLUCOSE: 120 mg/dL (ref 70–140)
Potassium: 3.7 mEq/L (ref 3.5–5.1)
SODIUM: 140 meq/L (ref 136–145)
TOTAL PROTEIN: 7.7 g/dL (ref 6.4–8.3)

## 2016-07-21 MED ORDER — SODIUM CHLORIDE 0.9% FLUSH
10.0000 mL | Freq: Once | INTRAVENOUS | Status: DC
Start: 2016-07-21 — End: 2016-07-21
  Filled 2016-07-21: qty 10

## 2016-07-21 MED ORDER — HEPARIN SOD (PORK) LOCK FLUSH 100 UNIT/ML IV SOLN
500.0000 [IU] | Freq: Once | INTRAVENOUS | Status: DC
  Filled 2016-07-21: qty 5

## 2016-07-21 NOTE — Progress Notes (Signed)
[place] Social Work  Clinical Social Work was referred by medical oncologist to review and complete healthcare advance directives.  Clinical Social Worker met with patient in [place] office.  The patient designated [employer] as their primary healthcare agent and Dejha King as their secondary agent.     Clinical Social Worker notarized documents and made copies for patient/family. Clinical Social Worker will send documents to medical records to be scanned into patient's chart. Clinical Social Worker encouraged patient/family to contact with any additional questions or concerns.  Lydia Walsh, MSW, LCSW, OSW-C Clinical Social Worker [facility] [phone]

## 2016-07-21 NOTE — Assessment & Plan Note (Signed)
She has asymptomatic left kidney mass She has been refer to see urologist for future follow-up She is not symptomatic

## 2016-07-21 NOTE — Progress Notes (Signed)
Port was removed July 17, 2016. Labs will now be drawn peripherally. Porsche Cates LPN

## 2016-07-21 NOTE — Assessment & Plan Note (Signed)
The patient have no clinical signs of disease Port has been removed at it was thought to have caused her irregular heartbeat I will see her once a year with history, physical examination only

## 2016-07-21 NOTE — Progress Notes (Signed)
[place] FOLLOW-UP progress notes  Patient Care Team: Thomasene Ripple, MD as PCP - General Plyler, Chauncey Reading, RD as Dietitian (Dietician)  CHIEF COMPLAINTS/PURPOSE OF VISIT:  Endometrial cancer  HISTORY OF PRESENTING ILLNESS:  Lydia Walsh 61 y.o. female was transferred to my care after her prior physician has left.  I reviewed the patient's records extensive and collaborated the history with the patient. Summary of her history is as follows: Oncology History   MSI stable     Endometrial cancer ([place])   03/31/2014 Procedure    Cholelithiasis without complicating factors. Fatty liver      02/12/2015 Initial Diagnosis    Patient had ~ 6 months of post menopausal bleeding, at times heavy, prior to reestablishing with Dr Arvella Nigh. [place] 02-12-15 showed slightly enlarged uterus with multiple intramural fibroids up to 3.5 cm, endometrial thickness 4 mm, polypoid lesion in cavity, ovaries grossly normal      03/07/2015 Pathology Results    1. Endometrium, resection - ENDOMETRIAL ADENOCARCINOMA. 2. Endometrium, curettage - SCANT ENDOMETRIAL STROMA AND BENIGN GLANDS. - NO HYPERPLASIA OR MALIGNANCY. Microscopic Comment 1. There is endometrioid adenocarcinoma with foci of squamous differentiation, FIGO grade 2.      03/07/2015 Procedure    She underwent D & C      03/07/2015 Surgery    Paracervical block.  Cervical dilation. Hysteroscopy with MyoSure resection of endometrial thickening and polyps.  Endometrial curettings      05/02/2015 Pathology Results    1. Lymph node, sentinel, biopsy, right external iliac vein sentinel - TWO BENIGN LYMPH NODES (0/2). 2. Lymph node, sentinel, biopsy, left external iliac sentinel - BENIGN ADIPOSE TISSUE. - NO LYMPH NODE TISSUE OR MALIGNANCY. 3. Uterus +/- tubes/ovaries, neoplastic, cervix - ENDOMETRIAL ADENOCARCINOMA EXTENDING INTO THE DEEP MYOMETRIUM. - NO CERVICAL, BILATERAL FALLOPIAN TUBES OR BILATERAL OVARY INVOLVEMENT. -  MARGINS NOT INVOLVED.  Specimen: Uterus with bilateral fallopian tubes and ovaries and right and left external iliac sentinel lymph nodes. Procedure: Hysterectomy with bilateral salpingo-oophorectomy and lymph node sampling. Lymph node sampling performed: Yes. Specimen integrity: Intact. Maximum tumor size: 7 cm Histologic type: Endometrioid adenocarcinoma. Grade: FIGO grade 2. Myometrial invasion: 1.4 cm where myometrium is 1.5 cm in thickness Cervical stromal involvement: No. Extent of involvement of other organs: None detected. Lymph - vascular invasion: Present, focal. Peritoneal washings: Pending. Lymph nodes: # examined 2. ; # positive 0 Pelvic lymph nodes: 0 involved of 2 lymph nodes. Para-aortic lymph nodes: N/A involved of N/A lymph nodes. Other (specify involvement and site): 0 TNM code: pT1b, pN0 FIGO Stage (based on pathologic findings, needs clinical correlation): IB Comment: Immunohistochemistry for cytokeratin AE1/AE3 is performed on the two right external iliac nodes and no positivity is identified.   3. Mismatch Repair (MMR) Protein Immunohistochemistry (IHC) IHC Expression Result: MLH1: Preserved nuclear expression (greater 50% tumor expression) MSH2: Preserved nuclear expression (greater 50% tumor expression) MSH6: Preserved nuclear expression (greater 50% tumor expression) PMS2: Preserved nuclear expression (greater 50% tumor expression) * Internal control demonstrates intact nuclear expression Interpretation: NORMAL There is preserved expression of the major and minor MMR proteins. There is a very low probability that microsatellite instability (MSI) is present. However, certain clinically significant MMR protein mutations may result in preservation of nuclear expression. It is recommended that the preservation of protein expression be correlated with molecular based MSI testing      05/02/2015 Surgery    Operation: Robotic-assisted laparoscopic hysterectomy with  bilateral salpingoophorectomy bilateral sentinel lymph node sampling, lysis of  adhesions  Operative Findings:  1.  No extrauterine disease. 2.  A 8 cm uterus. 3.  Normal bilateral adnexa. 4.  No evidence of any extrauterine disease. 5.  Normal appearing cervix.         05/24/2015 Imaging    Ct abdomen: 1. Mild left common iliac lymphadenopathy. Borderline prominent rounded low aortocaval node. These nodes are suspicious for metastatic disease. 2. No residual or recurrent tumor at the vaginal cuff. No adnexal mass. No visceral metastases. 3. Indeterminate dense 1.6 cm renal mass in the lower left kidney, for which renal cell carcinoma is to be excluded. Renal mass protocol MRI abdomen without and with IV contrast is recommended for further evaluation. If MRI is not clinically feasible, a renal mass protocol CT abdomen without and with IV contrast is recommended. 4. Additional findings include coronary atherosclerosis, cholelithiasis, nonobstructing right renal stone and moderate sigmoid diverticulosis.      06/12/2015 PET scan    1. 10 mm left common iliac lymph node is mildly hypermetabolic, worrisome for metastatic disease. Additional sub cm lymph nodes seen described on 05/24/2015 may be too small for PET resolution. 2. Focal hypermetabolism along the lower left lateral abdominal musculature may be postoperative in etiology. 3. Mildly hyperdense lesion in the lower pole left kidney, also seen and described on 05/24/2015. Please refer to that report for further details and recommendations. 4. Coronary artery calcification. 5. Cholelithiasis.      06/26/2015 - 11/27/2015 Chemotherapy    She received 6 cycles of carbo/Taxol      07/15/2015 Procedure    Status post right IJ port catheter placement. Catheter ready for use      09/02/2015 - 10/24/2015 Radiation Therapy    1) 09/02/15-10/08/15: External beam radiation to the pelvis - 50 Gy in 25 fractions 2) 10/15/15, 10/22/15,  10/24/15 - HDR vaginal brachytherapy - 18 Gy in 3 fractions.      12/27/2015 Imaging    Ct abdomen 1. No residual lymphadenopathy or other evidence of metastatic endometrial carcinoma. 2. Persistent hypodense mass involving the lower pole of the left kidney, likely enhancing as correlated with previous PET-CT. This remains worrisome for renal cell carcinoma. Again, renal mass protocol MRI abdomen without and with contrast recommended. 3. Stable incidental findings including cholelithiasis, atherosclerosis, distal colonic diverticulosis and lumbar spondylosis.       02/17/2016 Miscellaneous    She saw Dr. Tresa Moore for follow-up related to abnormal renal lesion seen on imaging      07/17/2016 Procedure    Technically successful tunneled Port catheter removal.     She feels well. She had no residual peripheral neuropathy Her port was recently been removed due to occasional palpitation and overall assessment points to its the port as potential culprit of inducing abnormal heartbeat She denies vaginal bleeding, pelvic discomfort or changes in bowel habits She denies recent p[place], restless leg syndrome or symptoms of iron deficiency anemia No recent hematuria or flank pain Her last appointment to see urologist was in February  MEDICAL HISTORY:  Past Medical History:  Diagnosis Date  . Anemia   . Cancer [facility])    endometrial cancer  . Endometrial polyp   . GERD (gastroesophageal reflux disease)   . Heart murmur   . Heart palpitations   . History of blood transfusion   . History of chemotherapy   . History of radiation therapy   . History of uterine fibroid   . Hyperlipidemia   . Hypertension   . Hypothyroidism   .  OSA (obstructive sleep apnea)    moderate OSA per study 06-21-2005--  per pt does need cpap anymore never purchased it  . Osteoarthritis   . PMB (postmenopausal bleeding)   . Wears dentures    upper  . Wears glasses     SURGICAL HISTORY: Past Surgical History:   Procedure Laterality Date  . COLONOSCOPY    . DILATATION & CURETTAGE/HYSTEROSCOPY WITH MYOSURE N/A 03/07/2015   Procedure: DILATATION & CURETTAGE/HYSTEROSCOPY WITH MYOSURE (POLYP);  Surgeon: Arvella Nigh, MD;  Location: [facility];  Service: Gynecology;  Laterality: N/A;  . DILATATION & CURRETTAGE/HYSTEROSCOPY WITH RESECTOCOPE  12-13-2002  &  09-26-2004   polyp/  submucosal fibroid  . ESOPHAGOGASTRODUODENOSCOPY    . EXCISION BENIGN RIGHT BREAST MASS  age 89  . IR REMOVAL TUN ACCESS W/ PORT W/O FL MOD SED  07/17/2016  . KNEE ARTHROSCOPY W/ MENISCECTOMY Bilateral right 04-12-2006//  left 12-29-2006   and Chondroplasty  . port placement    . ROBOTIC ASSISTED TOTAL HYSTERECTOMY WITH BILATERAL SALPINGO OOPHERECTOMY Bilateral 05/02/2015   Procedure: XI ROBOTIC ASSISTED TOTAL HYSTERECTOMY WITH BILATERAL SALPINGO OOPHORECTOMY AND SENTINAL LYMPH NODE BIOPSY;  Surgeon: Janie Morning, MD;  Location: [facility];  Service: Gynecology;  Laterality: Bilateral;  . TRANSTHORACIC ECHOCARDIOGRAM  04-02-2010   normal LV, ef 65%  . TUBAL LIGATION  1980's    SOCIAL HISTORY: Social History   Social History  . Marital status: Widowed    Spouse name: N/A  . Number of children: 3  . Years of education: N/A   Occupational History  . security office    Social History Main Topics  . Smoking status: Former Smoker    Years: 2.00    Types: Cigarettes    Quit date: 02/28/1994  . Smokeless tobacco: Never Used  . Alcohol use No  . Drug use: No  . Sexual activity: No   Other Topics Concern  . 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 Hypertension Mother   . Cirrhosis Father        alcoholic  . Colon cancer Neg Hx   . Stomach cancer Neg Hx   . Rectal cancer Neg Hx   . Esophageal cancer Neg Hx   . Liver cancer Neg  Hx     ALLERGIES:  is allergic to hctz [hydrochlorothiazide].  MEDICATIONS:  Current Outpatient Prescriptions  Medication Sig Dispense Refill  . allopurinol (ZYLOPRIM) 100 MG tablet TAKE ONE TABLET BY MOUTH ONCE DAILY 90 tablet 1  . amLODipine (NORVASC) 5 MG tablet Take 1 tablet (5 mg total) by mouth daily. 30 tablet 6  . chlorpheniramine-HYDROcodone (TUSSIONEX PENNKINETIC ER) 10-8 MG/5ML SUER Take 5 mLs by mouth every 12 (twelve) hours as needed for cough. 115 mL 0  . Ferrous Fumarate (HEMOCYTE - 106 MG FE) 324 (106 Fe) MG TABS tablet Take 1 tablet (106 mg of iron total) by mouth daily. On an empty stomach with OJ 30 tablet 3  . fluticasone (FLONASE) 50 MCG/ACT nasal spray Place 2 sprays into both nostrils daily. 16 g 1  . ibuprofen (ADVIL,MOTRIN) 200 MG tablet Take 200 mg by mouth as needed.    Marland Kitchen levothyroxine (SYNTHROID, LEVOTHROID) 125 MCG tablet Take 1 tablet (125 mcg total) by mouth daily before breakfast. 90 tablet  3  . metoprolol succinate (TOPROL-XL) 25 MG 24 hr tablet Take 1 tablet (25 mg total) by mouth daily. 30 tablet 6  . omeprazole (PRILOSEC) 20 MG capsule daily.   0  . potassium chloride (K-DUR) 10 MEQ tablet TAKE 1 TABLET BY MOUTH ONCE DAILY 90 tablet 0  . pramipexole (MIRAPEX) 0.125 MG tablet Take 1 tablet (0.125 mg total) by mouth at bedtime. 30 tablet 1  . simvastatin (ZOCOR) 20 MG tablet Take 1 tablet (20 mg total) by mouth every evening. 30 tablet 11  . spironolactone (ALDACTONE) 25 MG tablet Take 2 tablets (50 mg total) by mouth daily. D/c previous spironolactone prescription 180 tablet 2   No current facility-administered medications for this visit.     REVIEW OF SYSTEMS:   Constitutional: Denies fevers, chills or abnormal night sweats Eyes: Denies blurriness of vision, double vision or watery eyes Ears, nose, mouth, throat, and face: Denies mucositis or sore throat Respiratory: Denies cough, dyspnea or wheezes Cardiovascular: Denies palpitation, chest discomfort  or lower extremity swelling Gastrointestinal:  Denies nausea, heartburn or change in bowel habits Skin: Denies abnormal skin rashes Lymphatics: Denies new lymphadenopathy or easy bruising Neurological:Denies numbness, tingling or new weaknesses Behavioral/Psych: Mood is stable, no new changes  All other systems were reviewed with the patient and are negative.  PHYSICAL EXAMINATION: ECOG PERFORMANCE STATUS: 0 - Asymptomatic  Vitals:   07/21/16 0848  BP: (!) 131/59  Pulse: 81  Resp: 18  Temp: 98.2 F (36.8 C)   Filed Weights   07/21/16 0848  Weight: 167 lb 9.6 oz (76 kg)    GENERAL:alert, no distress and comfortable SKIN: skin color, texture, turgor are normal, no rashes or significant lesions EYES: normal, conjunctiva are pink and non-injected, sclera clear OROPHARYNX:no exudate, normal lips, buccal mucosa, and tongue  NECK: supple, thyroid normal size, non-tender, without nodularity LYMPH:  no palpable lymphadenopathy in the cervical, axillary or inguinal LUNGS: clear to auscultation and percussion with normal breathing effort HEART: regular rate & rhythm and no murmurs without lower extremity edema ABDOMEN:abdomen soft, non-tender and normal bowel sounds Musculoskeletal:no cyanosis of digits and no clubbing  PSYCH: alert & oriented x 3 with fluent speech NEURO: no focal motor/sensory deficits  LABORATORY DATA:  I have reviewed the data as listed Lab Results  Component Value Date   WBC 4.8 07/21/2016   HGB 12.8 07/21/2016   HCT 38.5 07/21/2016   MCV 91.3 07/21/2016   PLT 190 07/21/2016    Recent Labs  12/12/15 1244  01/23/16 0903 06/01/16 1357 07/17/16 0752 07/21/16 0827  NA 139  < > 141 143 141 140  K 3.1*  < > 3.9 5.0 3.8 3.7  CL  --   --   --  102 107  --   CO2 26  < > _0 GLUCOSE 100  < > 121 102* 126* 120  BUN 11.7  < > 18.6 22 24* 16.2  CREATININE 0.6  < > 0.8 0.80 0.90 0.8  CALCIUM 9.4  < > 9.9 10.2 9.4 10.4  GFRNONAA  --   --   --  79  >60  --   GFRAA  --   --   --  91 >60  --   PROT 6.8  --  7.1  --   --  7.7  ALBUMIN 3.4*  --  3.8  --   --  4.0  AST 23  --  17  --   --  16  ALT 25  --  18  --   --  18  ALKPHOS 112  --  88  --   --  108  BILITOT 0.40  --  0.57  --   --  0.70  < > = values in this interval not displayed.  RADIOGRAPHIC STUDIES: I have personally reviewed the radiological images as listed and agreed with the findings in the report. Ir Removal [employer] W/o [place] Mod Sed  Result Date: 07/17/2016 CLINICAL HISTORY: 61 year old with history of endometrial cancer. Patient has completed therapy. Port-A-Cath no longer needed INDICATION: TUNNELED PORT CATHETER REMOVAL MEDICATIONS: Ancef 2 g; The antibiotic was administered within an appropriate time interval prior to skin puncture. ANESTHESIA/SEDATION: Versed 2.0 mg IV; Fentanyl 100 mcg IV; Moderate Sedation Time:  34 minutes The patient was continuously monitored during the procedure by the interventional radiology nurse under my direct supervision. FLUOROSCOPY TIME:  None COMPLICATIONS: None immediate. PROCEDURE: The procedure was explained to the patient. The risks and benefits of the procedure were discussed and the patient's questions were addressed. Informed consent was obtained from the patient. Right chest was prepped and draped in a sterile fashion. Maximal barrier sterile technique was utilized including caps, mask, sterile gowns, sterile gloves, sterile drape, hand hygiene and skin antiseptic. The skin was anesthetized with 1% lidocaine. Incision was made over the old scar. The port was exposed and removed without complication. Pocket was irrigated with sterile saline. Pocket was closed using 2 layers of absorbable suture and Dermabond. Dressing placed over the incision. IMPRESSION: 1. Technically successful tunneled Port catheter removal. Electronically Signed   By: Markus Daft M.D.   On: 07/17/2016 11:57    ASSESSMENT & PLAN:  Endometrial cancer ([place]) The  patient have no clinical signs of disease Port has been removed at it was thought to have caused her irregular heartbeat I will see her once a year with history, physical examination only  Renal mass, left She has asymptomatic left kidney mass She has been refer to see urologist for future follow-up She is not symptomatic  Iron deficiency anemia due to chronic blood loss Iron deficiency anemia had resolved I recommend discontinuation of oral iron supplement   No orders of the defined types were placed in this encounter.   All questions were answered. The patient knows to call the clinic with any problems, questions or concerns. I spent 20 minutes counseling the patient face to face. The total time spent in the appointment was 30 minutes and more than 50% was on counseling.     Heath Lark, MD 07/21/2016 10:15 AM

## 2016-07-21 NOTE — Assessment & Plan Note (Signed)
Iron deficiency anemia had resolved I recommend discontinuation of oral iron supplement

## 2016-07-30 ENCOUNTER — Other Ambulatory Visit: Payer: Self-pay | Admitting: *Deleted

## 2016-07-30 MED ORDER — OMEPRAZOLE 20 MG PO CPDR: 20.0000 mg | DELAYED_RELEASE_CAPSULE | Freq: Every day | ORAL | 2 refills | Status: DC

## 2016-08-05 ENCOUNTER — Encounter: Payer: Self-pay | Admitting: Physician Assistant

## 2016-08-05 ENCOUNTER — Ambulatory Visit (INDEPENDENT_AMBULATORY_CARE_PROVIDER_SITE_OTHER): Payer: BLUE CROSS/BLUE SHIELD | Admitting: Physician Assistant

## 2016-08-05 VITALS — BP 102/62 | HR 72 | Ht 60.0 in | Wt 167.6 lb

## 2016-08-05 DIAGNOSIS — R002 Palpitations: Secondary | ICD-10-CM | POA: Diagnosis not present

## 2016-08-05 DIAGNOSIS — I10 Essential (primary) hypertension: Secondary | ICD-10-CM | POA: Diagnosis not present

## 2016-08-05 NOTE — Progress Notes (Signed)
Cardiology Office Note   Date:  08/05/2016   ID:  Lydia Walsh, DOB 1955-03-04, MRN [id number]  PCP:  Thomasene Ripple, MD  Cardiologist:  Dr Gwenlyn Found 05/01/2016 Rosaria Ferries, PA-C   Chief Complaint  Patient presents with  . Follow-up    History of Present Illness: Lydia Walsh is a 61 y.o. female with a history of palpitations with no significant arrhythmia on event monitor and normal 2-D echocardiogram, Port-A-Cath was removed 07/17/2016, endometrial cancer s/p D&C, XRT and chemo, GERD, HTN, OSA, hypothyroid  04/18 office visit amlodipine decreased and Toprol-XL 25 mg daily added  Lydia Walsh presents for cardiology follow up.  She has had her port taken out. Since then, she feels better. She has had no fluttering, feels much calmer. No history of presyncope or syncope. No orthostatic symptoms.   She is working part-time in [place], likes this. She got a Yorkie, walks him.   She has pain in her L tibia, wonders if she should see her PCP (I said yes).  No chest pain, no LE edema.   She is not on CPAP, says she quit eating at [facility] night and laying down>>sx resolved. Omeprazole helps her reflux.    Past Medical History:  Diagnosis Date  . Anemia   . Cancer [facility])    endometrial cancer  . Endometrial polyp   . GERD (gastroesophageal reflux disease)   . Heart murmur   . Heart palpitations   . History of blood transfusion   . History of chemotherapy   . History of radiation therapy   . History of uterine fibroid   . Hyperlipidemia   . Hypertension   . Hypothyroidism   . OSA (obstructive sleep apnea)    moderate OSA per study 06-21-2005--  per pt does need cpap anymore never purchased it  . Osteoarthritis   . PMB (postmenopausal bleeding)   . Wears dentures    upper  . Wears glasses     Past Surgical History:  Procedure Laterality Date  . COLONOSCOPY    . DILATATION & CURETTAGE/HYSTEROSCOPY WITH MYOSURE N/A 03/07/2015   Procedure: DILATATION &  CURETTAGE/HYSTEROSCOPY WITH MYOSURE (POLYP);  Surgeon: Arvella Nigh, MD;  Location: [facility];  Service: Gynecology;  Laterality: N/A;  . DILATATION & CURRETTAGE/HYSTEROSCOPY WITH RESECTOCOPE  12-13-2002  &  09-26-2004   polyp/  submucosal fibroid  . ESOPHAGOGASTRODUODENOSCOPY    . EXCISION BENIGN RIGHT BREAST MASS  age 52  . IR REMOVAL TUN ACCESS W/ PORT W/O FL MOD SED  07/17/2016  . KNEE ARTHROSCOPY W/ MENISCECTOMY Bilateral right 04-12-2006//  left 12-29-2006   and Chondroplasty  . port placement    . ROBOTIC ASSISTED TOTAL HYSTERECTOMY WITH BILATERAL SALPINGO OOPHERECTOMY Bilateral 05/02/2015   Procedure: XI ROBOTIC ASSISTED TOTAL HYSTERECTOMY WITH BILATERAL SALPINGO OOPHORECTOMY AND SENTINAL LYMPH NODE BIOPSY;  Surgeon: Janie Morning, MD;  Location: [facility];  Service: Gynecology;  Laterality: Bilateral;  . TRANSTHORACIC ECHOCARDIOGRAM  04-02-2010   normal LV, ef 65%  . TUBAL LIGATION  1980's    Current Outpatient Prescriptions  Medication Sig Dispense Refill  . allopurinol (ZYLOPRIM) 100 MG tablet TAKE ONE TABLET BY MOUTH ONCE DAILY 90 tablet 1  . amLODipine (NORVASC) 5 MG tablet Take 1 tablet (5 mg total) by mouth daily. 30 tablet 6  . Ferrous Fumarate (HEMOCYTE - 106 MG FE) 324 (106 Fe) MG TABS tablet Take 1 tablet (106 mg of iron total) by mouth daily. On an empty stomach with  OJ 30 tablet 3  . ibuprofen (ADVIL,MOTRIN) 200 MG tablet Take 200 mg by mouth as needed.    Marland Kitchen levothyroxine (SYNTHROID, LEVOTHROID) 125 MCG tablet Take 1 tablet (125 mcg total) by mouth daily before breakfast. 90 tablet 3  . metoprolol succinate (TOPROL-XL) 25 MG 24 hr tablet Take 1 tablet (25 mg total) by mouth daily. 30 tablet 6  . omeprazole (PRILOSEC) 20 MG capsule Take 1 capsule (20 mg total) by mouth daily. 30 capsule 2  . potassium chloride (K-DUR) 10 MEQ tablet TAKE 1 TABLET BY MOUTH ONCE DAILY 90 tablet 0  . simvastatin (ZOCOR) 20 MG tablet Take 1 tablet (20 mg total) by mouth every  evening. 30 tablet 11  . spironolactone (ALDACTONE) 25 MG tablet Take 2 tablets (50 mg total) by mouth daily. D/c previous spironolactone prescription 180 tablet 2   No current facility-administered medications for this visit.     Allergies:   Hctz [hydrochlorothiazide]    Social History:  The patient  reports that she quit smoking about 22 years ago. Her smoking use included Cigarettes. She quit after 2.00 years of use. She has never used smokeless tobacco. She reports that she does not drink alcohol or use drugs.   Family History:  The patient's family history includes Cirrhosis in her father; Hypertension in her mother.    ROS:  Please see the history of present illness. All other systems are reviewed and negative.    PHYSICAL EXAM: VS:  BP 102/62 (BP Location: Left Arm, Cuff Size: Normal)   Pulse 72   Ht 5' (1.524 m)   Wt 167 lb 9.6 oz (76 kg)   LMP 07/16/2014   SpO2 99%   BMI 32.73 kg/m  , BMI Body mass index is 32.73 kg/m. GEN: Well nourished, well developed, female in no acute distress  HEENT: normal for age  Neck: no JVD, no carotid bruit, no masses Cardiac: RRR; very soft murmur, no rubs, or gallops Respiratory:  Decreased BS bases bilaterally, normal work of breathing, lungs clear GI: soft, nontender, nondistended, + BS MS: no deformity or atrophy; no edema; distal pulses are 2+ in all 4 extremities. Area of tenderness in her mid-tibia noted, no deformity Skin: warm and dry, no rash Neuro:  Strength and sensation are intact Psych: euthymic mood, full affect   EKG:  EKG is not ordered today.   Recent Labs: 10/16/2015: Magnesium 2.1 10/25/2015: TSH 13.76 07/21/2016: ALT 18; BUN 16.2; Creatinine 0.8; HGB 12.8; Platelets 190; Potassium 3.7; Sodium 140    Lipid Panel    Component Value Date/Time   CHOL 219 (H) 04/04/2015 1027   TRIG 89 04/04/2015 1027   HDL 79 04/04/2015 1027   CHOLHDL 2.8 04/04/2015 1027   CHOLHDL 3.0 11/08/2013 1037   VLDL 18 11/08/2013  1037   LDLCALC 122 (H) 04/04/2015 1027     Wt Readings from Last 3 Encounters:  08/05/16 167 lb 9.6 oz (76 kg)  07/21/16 167 lb 9.6 oz (76 kg)  07/17/16 174 lb (78.9 kg)     Other studies Reviewed: Additional studies/ records that were reviewed today include: office notes, hospital records and testing.  ASSESSMENT AND PLAN:  1.  Palpitations: have resolved on the BB and after Port-A-Cath removal. Pt feels Port-A-Cath removal helped the most. Continue BB. BP is low-nl, but pt feels well. No side effects. No ischemic or CHF sx.   2. Hypertension: Her blood pressure is well controlled on current therapy. No med changes.   Current  medicines are reviewed at length with the patient today.  The patient does not have concerns regarding medicines.  The following changes have been made:  no change  Labs/ tests ordered today include:  No orders of the defined types were placed in this encounter.    Disposition:   FU with Dr Gwenlyn Found in 1 year.  Augusto Garbe  08/05/2016 9:10 AM    [facility] Phone: [phone]; Fax: [phone]  This note was written with the assistance of speech recognition software. Please excuse any transcriptional errors.

## 2016-08-05 NOTE — Patient Instructions (Signed)
Medication Instructions:  Your physician recommends that you continue on your current medications as directed. Please refer to the Current Medication list given to you today.   If you need a refill on your cardiac medications before your next appointment, please call your pharmacy.  Follow-Up: Your physician wants you to follow-up in: [place]. You will receive a reminder letter in the mail two months in advance. If you don't receive a letter, please call our office MAY 2019 to schedule the July 2019 follow-up appointment.  Thank you for choosing [facility] at [facility]!!

## 2016-09-03 ENCOUNTER — Ambulatory Visit
Admission: RE | Admit: 2016-09-03 | Discharge: 2016-09-03 | Disposition: A | Discharge status: Home / Self Care | Payer: BLUE CROSS/BLUE SHIELD | Source: Ambulatory Visit | Attending: Radiation Oncology | Admitting: Radiation Oncology

## 2016-09-03 ENCOUNTER — Encounter: Payer: Self-pay | Admitting: Radiation Oncology

## 2016-09-03 DIAGNOSIS — Z923 Personal history of irradiation: Secondary | ICD-10-CM | POA: Diagnosis not present

## 2016-09-03 DIAGNOSIS — Z888 Allergy status to other drugs, medicaments and biological substances status: Secondary | ICD-10-CM | POA: Insufficient documentation

## 2016-09-03 DIAGNOSIS — C541 Malignant neoplasm of endometrium: Secondary | ICD-10-CM | POA: Insufficient documentation

## 2016-09-03 DIAGNOSIS — Z79899 Other long term (current) drug therapy: Secondary | ICD-10-CM | POA: Diagnosis not present

## 2016-09-03 DIAGNOSIS — Z791 Long term (current) use of non-steroidal anti-inflammatories (NSAID): Secondary | ICD-10-CM | POA: Insufficient documentation

## 2016-09-03 DIAGNOSIS — R32 Unspecified urinary incontinence: Secondary | ICD-10-CM | POA: Diagnosis not present

## 2016-09-03 DIAGNOSIS — Z08 Encounter for follow-up examination after completed treatment for malignant neoplasm: Secondary | ICD-10-CM | POA: Diagnosis not present

## 2016-09-03 DIAGNOSIS — Z8542 Personal history of malignant neoplasm of other parts of uterus: Secondary | ICD-10-CM | POA: Diagnosis not present

## 2016-09-03 NOTE — Progress Notes (Addendum)
Lydia Walsh is here for follow up.  She reports having some pain in both sides of her groin last week.  She said the pain is gone now. She reports having some urinary incontinence but said she has always had that. She reports having occasional loose stools.  She denies having any vaginal bleeding or discharge.  She is not using the vaginal dilator.  She reports having trouble sleeping.    BP 130/72 (BP Location: Right Arm, Patient Position: Sitting)   Pulse 70   Temp 97.8 F (36.6 C) (Oral)   Ht 5' (1.524 m)   Wt 169 lb 3.2 oz (76.7 kg)   LMP 07/16/2014   SpO2 100%   BMI 33.04 kg/m    Wt Readings from Last 3 Encounters:  09/03/16 169 lb 3.2 oz (76.7 kg)  08/05/16 167 lb 9.6 oz (76 kg)  07/21/16 167 lb 9.6 oz (76 kg)

## 2016-09-03 NOTE — Progress Notes (Signed)
Radiation Oncology         [phone] ________________________________  Name: Lydia Walsh MRN: [id number]  Date: 09/03/2016  DOB: 1955/12/10    Follow-Up Visit Note  CC: Thomasene Ripple, MD  Gordy Levan, MD    ICD-10-CM   1. Endometrial cancer ([place]) C54.1     Diagnosis: Stage IIIC-1 (pT1b, pN0) grade 2 endometrioid adenocarcinoma    Interval Since Last Radiation:  10 months   1) 09/02/15-10/08/15: External beam radiation to the pelvis - 50 Gy in 25 fractions 2) 10/15/15, 10/22/15, 10/24/15 - HDR vaginal brachytherapy - 18 Gy in 3 fractions.  Narrative:  The patient returns today for routine follow-up. She reports having some pain in both sides of her groin last week. She said the pain is gone now. She reports having some urinary incontinence but said she always had that. She reports having occasional loose stools. She denies having any vaginal bleeding or discharge. She is not using the vaginal dilator. She reports having trouble sleeping. She denies any problems with nausea or cramping. She is scheduled to see her PCP on 09/07/2016 and plans to speak with him about her trouble sleeping. She has not tried any over the counter medication for sleep.   She was last seen by Dr. Alvy Bimler on 07/21/2016. She is scheduled to see Dr. Skeet Latch on 12/10/2016 who plans to do a PAP at that time. She is scheduled to see Dr. Alvy Bimler on 07/22/2017.  ALLERGIES:  is allergic to hctz [hydrochlorothiazide].  Meds: Current Outpatient Prescriptions  Medication Sig Dispense Refill  . allopurinol (ZYLOPRIM) 100 MG tablet TAKE ONE TABLET BY MOUTH ONCE DAILY 90 tablet 1  . amLODipine (NORVASC) 5 MG tablet Take 1 tablet (5 mg total) by mouth daily. 30 tablet 6  . ibuprofen (ADVIL,MOTRIN) 200 MG tablet Take 200 mg by mouth as needed.    Marland Kitchen levothyroxine (SYNTHROID, LEVOTHROID) 125 MCG tablet Take 1 tablet (125 mcg total) by mouth daily before breakfast. 90 tablet 3  . metoprolol succinate (TOPROL-XL)  25 MG 24 hr tablet Take 1 tablet (25 mg total) by mouth daily. 30 tablet 6  . omeprazole (PRILOSEC) 20 MG capsule Take 1 capsule (20 mg total) by mouth daily. 30 capsule 2  . potassium chloride (K-DUR) 10 MEQ tablet TAKE 1 TABLET BY MOUTH ONCE DAILY 90 tablet 0  . simvastatin (ZOCOR) 20 MG tablet Take 1 tablet (20 mg total) by mouth every evening. 30 tablet 11  . spironolactone (ALDACTONE) 25 MG tablet Take 2 tablets (50 mg total) by mouth daily. D/c previous spironolactone prescription 180 tablet 2   No current facility-administered medications for this encounter.     Physical Findings: The patient is in no acute distress. Patient is alert and oriented.  height is 5' (1.524 m) and weight is 169 lb 3.2 oz (76.7 kg). Her oral temperature is 97.8 F (36.6 C). Her blood pressure is 130/72 and her pulse is 70. Her oxygen saturation is 100%.   Lungs are clear to auscultation bilaterally. Heart has regular rate and rhythm. No palpable cervical, supraclavicular, or axillary adenopathy. Abdomen soft, non-tender, normal bowel sounds.  On initial exam the vaginal vault was narrowed and shortened (the patient has not been using her vaginal dilator). Adhesions were gently released and view of the vaginal cuff was obtained showing no mucosal lesions at the cuff or other areas of the vaginal wall. On bimanual and rectovaginal examination there were no pelvic masses appreciated.  Lab Findings: Lab Results  Component Value Date   WBC 4.8 07/21/2016   HGB 12.8 07/21/2016   HCT 38.5 07/21/2016   MCV 91.3 07/21/2016   PLT 190 07/21/2016    Radiographic Findings: No results found.  Impression:  No evidence of recurrence on clinical exam. PAP sample not taken, she will have PAP smear with Dr. Skeet Latch in November.  Plan: The patient is scheduled to follow up with Dr. Skeet Latch on 12/10/16. She will follow up with radiation oncology in 6 months from now.  -----------------------------------  Blair Promise, PhD, MD  This document serves as a record of services personally performed by Gery Pray, MD. It was created on his behalf by Arlyce Harman, a trained medical scribe. The creation of this record is based on the scribe's personal observations and the provider's statements to them. This document has been checked and approved by the attending provider.

## 2016-09-03 NOTE — Progress Notes (Signed)
  Home Care Instructions for the Insertion and Care of Your Vaginal Dilator  Why Do I Need a Vaginal Dilator?  Internal radiation therapy may cause scar tissue to form at the top of your vagina (vaginal cuff).  This may make vaginal examinations difficult in the future. You can prevent scar tissue from forming by using a vaginal dilator (a smooth plastic rod), and/or by having regular sexual intercourse.  If not using the dilator you should be having intercourse two or three times a week.  If you are unable to have intercourse, you should use your vaginal dilator.  You may have some spotting or bleeding from your dilator or intercourse the first few times. You may also have some discomfort. If discomfort occurs with intercourse, you and your partner may need to stop for a while and try again later.  How to Use Your Vaginal Dilator  - Wash the dilator with soap and water before and after each use. - Check the dilator to be sure it is smooth. Do not use the dilator if you find any roughspots. - Coat the dilator with K-Y Jelly, Astroglide, or Replens. Do not use Vaseline, baby oil, or other oil based lubricants. They are not water-soluble and can be irritating to the tissues in the vagina. - Lie on your back with your knees bent and legs apart. - Insert the rounded end of the dilator into your vagina as far as it will go without causing pain or discomfort. - Close your knees and slowly straighten your legs. - Keep the dilator in your vagina for about 10 to 15 minutes. Please use the dilator 3 times a week (Monday, Wednesday and Friday for example). [facility] your knees, open your legs, and gently remove the dilator. - Gently cleanse the skin around the vaginal opening. - Wash the dilator after each use. -  It is important that you use the dilator routinely until instructed otherwise by your doctor.

## 2016-09-07 ENCOUNTER — Ambulatory Visit (INDEPENDENT_AMBULATORY_CARE_PROVIDER_SITE_OTHER): Payer: BLUE CROSS/BLUE SHIELD | Admitting: Internal Medicine

## 2016-09-07 ENCOUNTER — Encounter: Payer: Self-pay | Admitting: Internal Medicine

## 2016-09-07 VITALS — BP 122/54 | HR 77 | Temp 98.5°F | Ht 60.0 in | Wt 171.4 lb

## 2016-09-07 DIAGNOSIS — K219 Gastro-esophageal reflux disease without esophagitis: Secondary | ICD-10-CM

## 2016-09-07 DIAGNOSIS — Z79899 Other long term (current) drug therapy: Secondary | ICD-10-CM

## 2016-09-07 DIAGNOSIS — G4700 Insomnia, unspecified: Secondary | ICD-10-CM | POA: Diagnosis not present

## 2016-09-07 DIAGNOSIS — Z923 Personal history of irradiation: Secondary | ICD-10-CM

## 2016-09-07 DIAGNOSIS — E039 Hypothyroidism, unspecified: Secondary | ICD-10-CM | POA: Diagnosis not present

## 2016-09-07 DIAGNOSIS — Z9221 Personal history of antineoplastic chemotherapy: Secondary | ICD-10-CM

## 2016-09-07 DIAGNOSIS — E876 Hypokalemia: Secondary | ICD-10-CM

## 2016-09-07 DIAGNOSIS — Z8542 Personal history of malignant neoplasm of other parts of uterus: Secondary | ICD-10-CM | POA: Diagnosis not present

## 2016-09-07 DIAGNOSIS — D649 Anemia, unspecified: Secondary | ICD-10-CM

## 2016-09-07 DIAGNOSIS — R002 Palpitations: Secondary | ICD-10-CM

## 2016-09-07 DIAGNOSIS — I10 Essential (primary) hypertension: Secondary | ICD-10-CM

## 2016-09-07 DIAGNOSIS — Z87891 Personal history of nicotine dependence: Secondary | ICD-10-CM

## 2016-09-07 MED ORDER — SIMVASTATIN 20 MG PO TABS: 20.0000 mg | ORAL_TABLET | Freq: Every evening | ORAL | 11 refills | Status: DC

## 2016-09-07 MED ORDER — AMLODIPINE BESYLATE 5 MG PO TABS: 5.0000 mg | ORAL_TABLET | Freq: Every day | ORAL | 11 refills | Status: DC

## 2016-09-07 MED ORDER — SPIRONOLACTONE 25 MG PO TABS: 50.0000 mg | ORAL_TABLET | Freq: Every day | ORAL | 3 refills | Status: DC

## 2016-09-07 MED ORDER — OMEPRAZOLE 20 MG PO CPDR: 20.0000 mg | DELAYED_RELEASE_CAPSULE | Freq: Every day | ORAL | 11 refills | Status: DC

## 2016-09-07 MED ORDER — POTASSIUM CHLORIDE ER 10 MEQ PO TBCR: 10.0000 meq | EXTENDED_RELEASE_TABLET | Freq: Every day | ORAL | 3 refills | Status: DC

## 2016-09-07 MED ORDER — METOPROLOL SUCCINATE ER 25 MG PO TB24: 25.0000 mg | ORAL_TABLET | Freq: Every day | ORAL | 11 refills | Status: DC

## 2016-09-07 NOTE — Patient Instructions (Addendum)
Thank you for seeing Lydia Walsh today!  We will check your thyroid levels today and I will call you with the results later this week.   Please continue taking all medications as needed!  Thanks you for seeing Lydia Walsh. Please come back in 3-6 months for a visit.

## 2016-09-07 NOTE — Assessment & Plan Note (Signed)
Patient's last BMP on 07/21/2016 showed K of 3.7 on K-dur 10 mEq daily and spirinolactone for BP. The patient states that she tolerates both these medications well. She states that since taking K supplements her leg cramps have decreased. Plan to continue this medication and monitor K levels with BMP.  Plan: -Continue K-Dur 10 mEq daily -BMP at next visit

## 2016-09-07 NOTE — Progress Notes (Signed)
   CC: HTN and hypothyroid follow up  HPI:  [name] is a 61 y.o. with a PMH of endometrial cancer (s/p chemo, radiation), HTN, anemia, GERD, and hypothyroidism who is presenting today for evaluation of her chronic medical conditions. The patient states that she has felt well since her last visit and that she has been able to take all medications as previously prescribed. Her only acute complaint today is insomnia. She states that for the past few weeks/months she has only been able to sleep for 1-2 hours per night. She is more tired than usual, but doesn't have to take naps during the day to recover from loss of sleep at night. She states that she just can't fall asleep in spite of trying to improve her sleep hygiene by turning off the TV and reading a book. She states that she sometimes has palpitations at night which keep her up. She denies chest pain with palpitations, diaphoresis, and nausea/vomiting. She states that she just feels like her heart races and she can't understand why. This has improved a little since her recent chemotherapy port removal but has not gone away completely. She states that she hasn't lost any weight recently. Denies diarrhea.   Past Medical History:  Diagnosis Date  . Anemia   . Cancer [facility])    endometrial cancer  . Endometrial polyp   . GERD (gastroesophageal reflux disease)   . Heart murmur   . Heart palpitations   . History of blood transfusion   . History of chemotherapy   . History of radiation therapy   . History of uterine fibroid   . Hyperlipidemia   . Hypertension   . Hypothyroidism   . OSA (obstructive sleep apnea)    moderate OSA per study 06-21-2005--  per pt does need cpap anymore never purchased it  . Osteoarthritis   . PMB (postmenopausal bleeding)   . Wears dentures    upper  . Wears glasses    Review of Systems:   Patient endorses insomnia and palpitations, as per HPI Patient denies chest pain, shortness of breath, abdominal  pain, diaphoresis, nausea/vomiting, lower extremity swelling, and change in bowel/bladder habits.  Physical Exam:  Vitals:   09/07/16 1504  BP: (!) 122/54  Pulse: 77  Temp: 98.5 F (36.9 C)  TempSrc: Oral  SpO2: 100%  Weight: 171 lb 6.4 oz (77.7 kg)  Height: 5' (1.524 m)   Physical Exam  Constitutional: She appears well-developed and well-nourished. No distress.  Cardiovascular: Normal rate, regular rhythm and intact distal pulses.   No murmur heard. Pulmonary/Chest: Effort normal. No respiratory distress. She has no wheezes.  No crackles appreciated  Abdominal: Soft. She exhibits no distension. There is no tenderness. There is no guarding.  Musculoskeletal: She exhibits no edema (of bilateral lower extremities) or tenderness (of bilateral lower extremities).  Skin: Skin is warm and dry. Capillary refill takes less than 2 seconds. No rash noted. No erythema.  Well healed incision on R upper chest along prior port placement site. No overlying signs of erythema or tenderness.   Assessment & Plan:   See Encounters Tab for problem based charting.  Patient seen with Dr. Angelia Mould

## 2016-09-07 NOTE — Assessment & Plan Note (Signed)
BP at goal of <140/90 today. Patient denies symptoms of hypotension, including dizziness with standing. Cr at baseline of 0.9 on BMP at 07/21/16. Plan to continue current regimen and provide refills for medications today.  Plan: -Continue metoprolol 25 mg, spironolactone 50 mg, and amlodipine 5 mg daily

## 2016-09-07 NOTE — Assessment & Plan Note (Signed)
The patient's last TSH was >13 when it was last checked in 10/2015. At that time the patient's syntrhoid was increased to 125 mcg daily. The patient's complaint of insomnia with intermittent palpitations may be consistent with a hyperthyroid state. She denies previous symptoms of leg cramping/restless legs which have contributed to insomnia in the past.  It less likely that the patient's dose of synthroid is too high, because other symptoms of hyperthyroidism including tachycardia, hypertension, diarrhea, and weight loss are absent. Since she has not had a TSH in about 1 year, plan to evaluate this today to determine if dose of synthroid needs to be decreased. Will call the patient later this week with results regarding this study.   If TSH within normal limits will consider sleep medication such as Melatonin or Ambien, as patient has already failed conservative treatment of improving sleep hygiene at home over the past few months.  Plan: -Check TSH today -Plan to call regarding results and discuss if additional sleep medication needed for patient to use PRN

## 2016-09-08 LAB — TSH: TSH: 0.143 u[IU]/mL — ABNORMAL LOW (ref 0.450–4.500)

## 2016-09-08 NOTE — Progress Notes (Signed)
Internal Medicine Clinic Attending  I saw and evaluated the patient.  I personally confirmed the key portions of the history and exam documented by Dr. Berneice Gandy and I reviewed pertinent patient test results.  The assessment, diagnosis, and plan were formulated together and I agree with the documentation in the resident's note. TSH has returned low, iatrogenic hyperthyroidism this may be the cause of her insomnia.  I would recommend decreasing back down to the 179mcg levothyroxine dose and repeat TSH in about 3 months.

## 2016-09-10 ENCOUNTER — Telehealth: Payer: Self-pay | Admitting: Internal Medicine

## 2016-09-10 MED ORDER — LEVOTHYROXINE SODIUM 112 MCG PO TABS: 112.0000 ug | ORAL_TABLET | Freq: Every day | ORAL | 3 refills | Status: DC

## 2016-09-10 NOTE — Telephone Encounter (Signed)
Called patient to inform her of TSH results and explained that her symptoms of palpitations and inability to sleep daily could be due to iatrogenic hyperthyroidism. Called prescription for 112 mcg synthroid to her pharmacy. Patient states that she will be able to pick it up tomorrow. She was told to call for any questions or difficulties with this new medication. She will follow up in clinic for TSH recheck in 3 months.

## 2016-09-22 ENCOUNTER — Encounter: Payer: Self-pay | Admitting: Internal Medicine

## 2016-09-22 ENCOUNTER — Ambulatory Visit (INDEPENDENT_AMBULATORY_CARE_PROVIDER_SITE_OTHER): Payer: BLUE CROSS/BLUE SHIELD | Admitting: Internal Medicine

## 2016-09-22 ENCOUNTER — Ambulatory Visit (HOSPITAL_COMMUNITY): Payer: BLUE CROSS/BLUE SHIELD | Attending: Internal Medicine

## 2016-09-22 VITALS — BP 127/57 | HR 74 | Temp 98.2°F | Ht 60.0 in | Wt 170.7 lb

## 2016-09-22 DIAGNOSIS — E032 Hypothyroidism due to medicaments and other exogenous substances: Secondary | ICD-10-CM

## 2016-09-22 DIAGNOSIS — Z79899 Other long term (current) drug therapy: Secondary | ICD-10-CM

## 2016-09-22 DIAGNOSIS — Z923 Personal history of irradiation: Secondary | ICD-10-CM | POA: Diagnosis not present

## 2016-09-22 DIAGNOSIS — R002 Palpitations: Secondary | ICD-10-CM | POA: Insufficient documentation

## 2016-09-22 DIAGNOSIS — Z888 Allergy status to other drugs, medicaments and biological substances status: Secondary | ICD-10-CM | POA: Diagnosis not present

## 2016-09-22 DIAGNOSIS — Z791 Long term (current) use of non-steroidal anti-inflammatories (NSAID): Secondary | ICD-10-CM | POA: Diagnosis not present

## 2016-09-22 DIAGNOSIS — I10 Essential (primary) hypertension: Secondary | ICD-10-CM

## 2016-09-22 DIAGNOSIS — T381X5D Adverse effect of thyroid hormones and substitutes, subsequent encounter: Secondary | ICD-10-CM

## 2016-09-22 DIAGNOSIS — C541 Malignant neoplasm of endometrium: Secondary | ICD-10-CM | POA: Diagnosis not present

## 2016-09-22 DIAGNOSIS — Z87891 Personal history of nicotine dependence: Secondary | ICD-10-CM | POA: Diagnosis not present

## 2016-09-22 DIAGNOSIS — I440 Atrioventricular block, first degree: Secondary | ICD-10-CM | POA: Insufficient documentation

## 2016-09-22 MED ORDER — METOPROLOL SUCCINATE ER 50 MG PO TB24: 50.0000 mg | ORAL_TABLET | Freq: Every day | ORAL | 11 refills | Status: DC

## 2016-09-22 NOTE — Progress Notes (Signed)
I saw and evaluated the patient. I personally confirmed the key portions of Dr. Revonda Standard history and exam and reviewed pertinent patient test results. The assessment, diagnosis, and plan were formulated together and I agree with the documentation in the resident's note.

## 2016-09-22 NOTE — Assessment & Plan Note (Signed)
EKG performed in clinic due to complaints of palpitations and associated chest ache.   EKG showed Sinus Rhythm with first degree AV block. HR 70, PR interval 219.   Assessment: This is new from previous EKGs. Most likely due to Metoprolol. Last normal EKG was at a Cardiology visit in 04/2016, at which the patient was started on Metoprolol 25 mg.   Plan: -Still increased metoprolol dose to 50 mg daily -Repeat EKG at follow up with PCP

## 2016-09-22 NOTE — Progress Notes (Signed)
   CC: palpitations  HPI:  Lydia Walsh is a 61 y.o. past medical history as documented below presenting for palpitations. She was last seen in clinic on 09/11/2015 for palpitations and insomnia. Her TSH level was checked and was found to be low at 0.143. Her Synthroid dose was adjusted accordingly from 125 to 112 mcg. Since that time she still is experiencing palpitations. The palpitations occur mainly at night, and have been occurring for approximately two weeks. She cannot say how long they last for exactly, but she estimates several minutes because she will fall asleep with the palpitations occuring. She also complains of a left sided chest ache that is associated with the palpations. She denies associated shortness of breath. She also endorses sweating, feeling hyper/jittery, and intermittent diarrhea.   Past Medical History:  Diagnosis Date  . Anemia   . Cancer [facility])    endometrial cancer  . Endometrial polyp   . GERD (gastroesophageal reflux disease)   . Heart murmur   . Heart palpitations   . History of blood transfusion   . History of chemotherapy   . History of radiation therapy   . History of uterine fibroid   . Hyperlipidemia   . Hypertension   . Hypothyroidism   . OSA (obstructive sleep apnea)    moderate OSA per study 06-21-2005--  per pt does need cpap anymore never purchased it  . Osteoarthritis   . PMB (postmenopausal bleeding)   . Wears dentures    upper  . Wears glasses    Review of Systems:   Review of Systems  Constitutional: Positive for diaphoresis. Negative for chills, fever and weight loss.  HENT: Negative.   Eyes: Negative.   Respiratory: Negative for shortness of breath.   Cardiovascular: Positive for chest pain and palpitations.  Gastrointestinal: Positive for diarrhea. Negative for constipation, nausea and vomiting.  Genitourinary: Negative.   Musculoskeletal: Negative.   Skin: Negative for rash.  Neurological: Negative.     Psychiatric/Behavioral: Negative.     Physical Exam:  Vitals:   09/22/16 0843  BP: (!) 127/57  Pulse: 74  Temp: 98.2 F (36.8 C)  TempSrc: Oral  SpO2: 100%  Weight: 170 lb 11.2 oz (77.4 kg)  Height: 5' (1.524 m)   General: Sitting in chair comfortably, NAD HEENT: [place]/AT, EOMI, no scleral icterus, PERRL, no thyromegaly Cardiac: RRR, No R/M/G appreciated, chest pain not reproducible Pulm: normal effort, CTAB Abd: soft, non tender, non distended, BS normal Ext: extremities well perfused, no peripheral edema Neuro: alert and oriented X3, cranial nerves II-XII grossly intact   Assessment & Plan:   See Encounters Tab for problem based charting.  Patient seen with Dr. Eppie Gibson

## 2016-09-22 NOTE — Assessment & Plan Note (Signed)
BP stable and well controlled. Blood Pressure 09/22/2016 09/07/2016 09/03/2016 08/05/2016  BP 127/57 122/54 130/72 102/62   Plan: -Stop Amlodipine 5 mg -Increase Metoprolol to 50 mg daily

## 2016-09-22 NOTE — Patient Instructions (Addendum)
Lydia Walsh,   It was a pleasure meeting you today.   The tracing of your heart (EKG) we did in clinic today was normal.  Please stop taking Amlodipine 5 mg.   Increase your Toprol XL (metoprolol succinate) from 25 mg daily to 50 mg daily. Take two tablets of Toprol 25 mg daily. This will help control your blood pressure, as well as decrease the sensation of your heart fluttering.   I have sent a prescription for Toprol XL 50 mg to your pharmacy, feel free to pick it up when you run out of your Toprol XL 25 mg.   Please follow up with your PCP in 3 months to recheck your thyroid.

## 2016-09-22 NOTE — Assessment & Plan Note (Addendum)
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 TSH 0.143. Her synthroid dose was reduced from 125 to 112 mcg two week ago. She had a 14 day cardiac event monitory, which was unrevealing as well as a normal ECHO in 10/2015. She has been on low dose metoprolol for her palpitations since April 2018, which has helped her. She also has associated symptoms with hyperthyroidism including sweating, feeling jittery, and intermittent diarrhea. Hemoglobin stable at 12.8, no concern for symptomatic anemia.  Plan: -Continue 112 mcg Levothyroxine -Will not recheck TSH because it has only been two weeks since dose adjustment; patient will follow up with PCP in 3 months for TSH recheck -Increase Metoprolol to 50 mg daily for symptomatic management and reassess need for increased dose at follow up visit -EKG to r/o arythmia or ischemia

## 2016-11-13 ENCOUNTER — Telehealth: Payer: Self-pay

## 2016-11-13 MED ORDER — FLUTICASONE PROPIONATE 50 MCG/ACT NA SUSP: NASAL | 5 refills | Status: DC

## 2016-11-13 MED ORDER — DEXTROMETHORPHAN HBR 15 MG/5ML PO SYRP: 10.0000 mL | ORAL_SOLUTION | Freq: Four times a day (QID) | ORAL | 0 refills | Status: DC | PRN

## 2016-11-13 NOTE — Telephone Encounter (Signed)
Patient stated she has been having cough x 2 weeks now, also having "sniffles". She remembers that on her appt 07/06/16, Dr Benjamine Mola gave her Tussionex that really helped with her cough back then. And if she can get that med ordered again.  Informed that she may need to be evaluated for her cough but refused due to other MD appts. Denies SOB.  Also requesting refill on flonase. Pls advise!

## 2016-11-13 NOTE — Telephone Encounter (Signed)
Requesting a refill on cough med. Please call pt back.

## 2016-11-16 NOTE — Telephone Encounter (Signed)
Pt informed

## 2016-11-16 NOTE — Telephone Encounter (Signed)
LM to call back- need to inform cough med has been ordered.

## 2016-11-16 NOTE — Telephone Encounter (Signed)
Pt is calling back to speak with a nurse. 

## 2016-11-18 ENCOUNTER — Ambulatory Visit: Payer: BLUE CROSS/BLUE SHIELD | Admitting: Internal Medicine

## 2016-11-18 ENCOUNTER — Encounter: Payer: Self-pay | Admitting: Internal Medicine

## 2016-11-18 ENCOUNTER — Telehealth: Payer: Self-pay | Admitting: *Deleted

## 2016-11-18 DIAGNOSIS — M170 Bilateral primary osteoarthritis of knee: Secondary | ICD-10-CM | POA: Diagnosis not present

## 2016-11-18 DIAGNOSIS — J069 Acute upper respiratory infection, unspecified: Secondary | ICD-10-CM | POA: Diagnosis not present

## 2016-11-18 DIAGNOSIS — Z87891 Personal history of nicotine dependence: Secondary | ICD-10-CM

## 2016-11-18 MED ORDER — HYDROCOD POLST-CPM POLST ER 10-8 MG/5ML PO SUER: 5.0000 mL | Freq: Two times a day (BID) | ORAL | 0 refills | Status: DC | PRN

## 2016-11-18 MED ORDER — DEXTROMETHORPHAN HBR 15 MG/5ML PO SYRP: 10.0000 mL | ORAL_SOLUTION | Freq: Four times a day (QID) | ORAL | 0 refills | Status: DC | PRN

## 2016-11-18 NOTE — Patient Instructions (Signed)
Please continue to take your medications as prescribed.  If there are any questions, please feel free to call our clinic.  You have been given a second referral to Orthopedic surgery for evaluation of your knees. It is essential that you keep this appointment.  Thank you for your visit to the [facility] clinic.

## 2016-11-18 NOTE — Progress Notes (Signed)
   CC: "cough x2 weeks"  HPI:  [name] is a 61 y.o. female who presents today for treatment of an unproductive cough for two weeks. She was previously treated with dextromethorphan most recently prescribed in June. Patient presents with intermittent unproductive cough, rhinorrhea, nasal congestion for 2 weeks only. She denied headache, nausea, diarrhea, constipation, fever, chills, sinus pain, face pain, earache, ear discharge, difficulty swallowing, respiratory difficulty, palpitations, chest pain, abdominal pain or muscle aches.   Past Medical History:  Diagnosis Date  . Anemia   . Cancer [facility])    endometrial cancer  . Endometrial polyp   . GERD (gastroesophageal reflux disease)   . Heart murmur   . Heart palpitations   . History of blood transfusion   . History of chemotherapy   . History of radiation therapy   . History of uterine fibroid   . Hyperlipidemia   . Hypertension   . Hypothyroidism   . OSA (obstructive sleep apnea)    moderate OSA per study 06-21-2005--  per pt does need cpap anymore never purchased it  . Osteoarthritis   . PMB (postmenopausal bleeding)   . Wears dentures    upper  . Wears glasses    Review of Systems:  ROS negative except as per HPI.  Physical Exam:  Vitals:   11/18/16 0848  BP: (!) 157/69  Pulse: 66  Temp: 97.8 F (36.6 C)  TempSrc: Oral  SpO2: 100%  Weight: 171 lb 12.8 oz (77.9 kg)  Height: 5' (1.524 m)   Physical Exam  Constitutional: She appears well-developed. No distress.  HENT:  Head: Normocephalic.  Nose: Nose normal.  Mouth/Throat: Oropharynx is clear and moist. No oropharyngeal exudate.  Eyes: Right eye exhibits no discharge. Left eye exhibits no discharge.  Cardiovascular: Normal rate and regular rhythm.  Pulmonary/Chest: Effort normal and breath sounds normal. No stridor. No respiratory distress. She has no wheezes. She exhibits no tenderness.  Abdominal: Soft. Bowel sounds are normal. She exhibits no  distension.  Musculoskeletal: She exhibits tenderness (Medial aspects of the patient's knees bilaterally overlying the tibiofemoral junction).  Lymphadenopathy:    She has no cervical adenopathy.  Skin: Skin is warm.  Nursing note and vitals reviewed.   Assessment & Plan:   See Encounters Tab for problem based charting.  Patient seen with Dr. Angelia Mould

## 2016-11-18 NOTE — Assessment & Plan Note (Signed)
Assessment: Patient has a chronic history of bilateral primary osteoarthritis of the knees with confirmatory imaging proximal 5 years ago.  The pain is been progressively worsening since that time and would like referral to orthopedic surgery today. Evaluation of the knees revealed minor tenderness the medial and lateral aspects with deep palpation.  Patient tested to significant weakness at times secondary to the pain.  The pain is consistently elevated throughout the day.  Plan: Referral to orthopedic surgery for evaluation as the patient is not agreeable to injections at this time. The patient states that she does not desire pain medication and will await evaluation by orthopedic surgery.

## 2016-11-18 NOTE — Assessment & Plan Note (Signed)
Assessment: Patient has a 2-week history of URI.  This is consistent given the rhinorrhea, postnasal drip most likely from her nasal discharge. See HPI for additional details Given the lack of systemic symptoms or signs concerning for influenza, material infection, pneumonia, or bronchitis we will treat this as per URI.  Plan: Prescribed Tussionex 10 mL's 4 times daily as needed for cough suppression Recommended patient continue the Flonase to assist with nasal congestion Recommended symptomatic treatment with maintaining p.o. intake. Inform the patient that if she were to develop fever, chills, myalgias, shortness of breath, chest pain, or other concerning symptoms that she would should return to clinic and revisit the ED.

## 2016-11-18 NOTE — Telephone Encounter (Signed)
Attempted to phone in rx for tussinex pennkinetic ER 10-8mg /68ml into pharmacy and was informed that rx cant be phoned in, but MD can send in electronically.  MD had already attempted to send electronically, but was unable to do so, rx was faxed to pharmacy.Regenia Skeeter, Avie Checo Cassady11/7/201811:31 AM

## 2016-11-18 NOTE — Addendum Note (Signed)
Addended by: Nicola Girt on: 11/18/2016 10:15 AM   Modules accepted: Orders

## 2016-11-18 NOTE — Addendum Note (Signed)
Addended by: Marcelino Duster on: 11/18/2016 10:23 AM   Modules accepted: Orders

## 2016-11-24 ENCOUNTER — Ambulatory Visit (INDEPENDENT_AMBULATORY_CARE_PROVIDER_SITE_OTHER): Payer: BLUE CROSS/BLUE SHIELD

## 2016-11-24 ENCOUNTER — Ambulatory Visit (INDEPENDENT_AMBULATORY_CARE_PROVIDER_SITE_OTHER): Payer: BLUE CROSS/BLUE SHIELD | Admitting: Orthopaedic Surgery

## 2016-11-24 ENCOUNTER — Encounter (INDEPENDENT_AMBULATORY_CARE_PROVIDER_SITE_OTHER): Payer: Self-pay | Admitting: Orthopaedic Surgery

## 2016-11-24 DIAGNOSIS — M170 Bilateral primary osteoarthritis of knee: Secondary | ICD-10-CM

## 2016-11-24 MED ORDER — DICLOFENAC SODIUM 75 MG PO TBEC: 75.0000 mg | DELAYED_RELEASE_TABLET | Freq: Two times a day (BID) | ORAL | 2 refills | Status: DC

## 2016-11-24 MED ORDER — DICLOFENAC SODIUM 1 % TD GEL: 2.0000 g | Freq: Four times a day (QID) | TRANSDERMAL | 5 refills | Status: DC

## 2016-11-24 NOTE — Progress Notes (Signed)
Office Visit Note   Patient: Lydia Walsh           Date of Birth: 04/10/1955           MRN: [id number] Visit Date: 11/24/2016              Requested by: Thomasene Ripple, MD [address] [place], [place] [address] PCP: Thomasene Ripple, MD   Assessment & Plan: Visit Diagnoses:  1. Bilateral primary osteoarthritis of knee     Plan: Impression is advanced degenerative joint disease of bilateral knees.  We offered her cortisone injections which she declined.  She is leaning towards knee replacement but she is to try to get another year out of this.  Prescription for oral and topical diclofenac.  Questions encouraged and answered.  Follow-up as needed.  Follow-Up Instructions: Return if symptoms worsen or fail to improve.   Orders:  Orders Placed This Encounter  Procedures  . XR KNEE 3 VIEW LEFT  . XR KNEE 3 VIEW RIGHT   Meds ordered this encounter  Medications  . diclofenac (VOLTAREN) 75 MG EC tablet    Sig: Take 1 tablet (75 mg total) 2 (two) times daily by mouth.    Dispense:  30 tablet    Refill:  2  . diclofenac sodium (VOLTAREN) 1 % GEL    Sig: Apply 2 g 4 (four) times daily topically.    Dispense:  1 Tube    Refill:  5      Procedures: No procedures performed   Clinical Data: No additional findings.   Subjective: Chief Complaint  Patient presents with  . Left Knee - Pain  . Right Knee - Pain    [name] is a very pleasant 61 year old female knee pain that has been progressively worsening over the last 6 months.  The pain is causing cracking and popping and giving way.  She has not fallen.  She takes ibuprofen with temporary and partial relief.  She has had previous knee scope and partial medial meniscectomy.  She endorses difficulty with ADLs and night pain.  She works as a Artist.  Pain does not radiate or cause numbness    Review of Systems  Constitutional: Negative.   HENT: Negative.   Eyes: Negative.   Respiratory: Negative.     Cardiovascular: Negative.   Endocrine: Negative.   Musculoskeletal: Negative.   Neurological: Negative.   Hematological: Negative.   Psychiatric/Behavioral: Negative.   All other systems reviewed and are negative.    Objective: Vital Signs: LMP 07/16/2014   Physical Exam  Constitutional: She is oriented to person, place, and time. She appears well-developed and well-nourished.  HENT:  Head: Normocephalic and atraumatic.  Eyes: EOM are normal.  Neck: Neck supple.  Pulmonary/Chest: Effort normal.  Abdominal: Soft.  Neurological: She is alert and oriented to person, place, and time.  Skin: Skin is warm. Capillary refill takes less than 2 seconds.  Psychiatric: She has a normal mood and affect. Her behavior is normal. Judgment and thought content normal.  Nursing note and vitals reviewed.   Ortho Exam  Specialty Comments:  No specialty comments available.  Imaging: No results found.   PMFS History: Patient Active Problem List   Diagnosis Date Noted  . Primary osteoarthritis of knees, bilateral 11/18/2016  . AV block, 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  . Viral upper respiratory tract infection 07/06/2016  . Restless leg syndrome 06/01/2016  . Bilateral knee pain 02/03/2016  . Esophageal dysphagia 09/03/2015  .  Port catheter in place 08/20/2015  . Hypokalemia 07/24/2015  . International Federation 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 ([place]) 07/10/2015  . 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[place]) 07/10/2015  . Rash 07/02/2015  . Renal mass, left 06/21/2015  . Nephrolithiasis 06/21/2015  . Obesity (BMI 30-39.9) 06/21/2015  . Iron deficiency anemia due to chronic blood loss 06/21/2015  . Endometrial cancer ([place]) 03/29/2015  . Arm pain, left 10/29/2013  . Palpitations 08/02/2013  . Major depression 06/02/2013  . Fatigue 05/02/2013  . Unspecified constipation 05/23/2012  . Cough 03/28/2012  . Osteoarthritis   . LOW BACK PAIN, CHRONIC  02/27/2008  . GOUT 11/24/2007  . ALLERGIC RHINITIS, SEASONAL 03/09/2006  . G E R D 02/19/2006  . Hypothyroidism 02/17/2006  . Dyslipidemia 02/17/2006  . Essential hypertension 02/17/2006  . OBSTRUCTIVE SLEEP APNEA 06/21/2005   Past Medical History:  Diagnosis Date  . Anemia   . Cancer [facility])    endometrial cancer  . Endometrial polyp   . GERD (gastroesophageal reflux disease)   . Heart murmur   . Heart palpitations   . History of blood transfusion   . History of chemotherapy   . History of radiation therapy   . History of uterine fibroid   . Hyperlipidemia   . Hypertension   . Hypothyroidism   . OSA (obstructive sleep apnea)    moderate OSA per study 06-21-2005--  per pt does need cpap anymore never purchased it  . Osteoarthritis   . PMB (postmenopausal bleeding)   . Wears dentures    upper  . Wears glasses     Family History  Problem Relation Age of Onset  . Hypertension Mother   . Cirrhosis Father        alcoholic  . Colon cancer Neg Hx   . Stomach cancer Neg Hx   . Rectal cancer Neg Hx   . Esophageal cancer Neg Hx   . Liver cancer Neg Hx     Past Surgical History:  Procedure Laterality Date  . COLONOSCOPY    . DILATATION & CURRETTAGE/HYSTEROSCOPY WITH RESECTOCOPE  12-13-2002  &  09-26-2004   polyp/  submucosal fibroid  . ESOPHAGOGASTRODUODENOSCOPY    . EXCISION BENIGN RIGHT BREAST MASS  age 70  . IR REMOVAL TUN ACCESS W/ PORT W/O FL MOD SED  07/17/2016  . KNEE ARTHROSCOPY W/ MENISCECTOMY Bilateral right 04-12-2006//  left 12-29-2006   and Chondroplasty  . port placement    . TRANSTHORACIC ECHOCARDIOGRAM  04-02-2010   normal LV, ef 65%  . TUBAL LIGATION  1980's   Social History   Occupational History  . Occupation: Production manager, [employer]  Tobacco Use  . Smoking status: Former Smoker    Years: 2.00    Types: Cigarettes    Last attempt to quit: 02/28/1994    Years since quitting: 22.7  . Smokeless tobacco: Never Used  Substance and Sexual  Activity  . Alcohol use: No    Alcohol/week: 0.0 oz  . Drug use: No  . Sexual activity: No    Birth control/protection: Post-menopausal

## 2016-11-24 NOTE — Progress Notes (Signed)
Internal Medicine Clinic Attending  I saw and evaluated the patient.  I personally confirmed the key portions of the history and exam documented by Dr. Harbrecht and I reviewed pertinent patient test results.  The assessment, diagnosis, and plan were formulated together and I agree with the documentation in the resident's note.  

## 2016-12-10 ENCOUNTER — Ambulatory Visit: Payer: BLUE CROSS/BLUE SHIELD | Attending: Gynecologic Oncology | Admitting: Gynecologic Oncology

## 2016-12-10 ENCOUNTER — Encounter: Payer: Self-pay | Admitting: Gynecologic Oncology

## 2016-12-10 VITALS — BP 132/67 | HR 72 | Temp 98.0°F | Resp 20 | Ht 60.0 in | Wt 170.4 lb

## 2016-12-10 DIAGNOSIS — N289 Disorder of kidney and ureter, unspecified: Secondary | ICD-10-CM | POA: Diagnosis not present

## 2016-12-10 DIAGNOSIS — Z8379 Family history of other diseases of the digestive system: Secondary | ICD-10-CM | POA: Diagnosis not present

## 2016-12-10 DIAGNOSIS — Z90722 Acquired absence of ovaries, bilateral: Secondary | ICD-10-CM | POA: Diagnosis not present

## 2016-12-10 DIAGNOSIS — Z923 Personal history of irradiation: Secondary | ICD-10-CM | POA: Diagnosis not present

## 2016-12-10 DIAGNOSIS — Z79899 Other long term (current) drug therapy: Secondary | ICD-10-CM | POA: Insufficient documentation

## 2016-12-10 DIAGNOSIS — Z9221 Personal history of antineoplastic chemotherapy: Secondary | ICD-10-CM | POA: Insufficient documentation

## 2016-12-10 DIAGNOSIS — R635 Abnormal weight gain: Secondary | ICD-10-CM | POA: Insufficient documentation

## 2016-12-10 DIAGNOSIS — G4733 Obstructive sleep apnea (adult) (pediatric): Secondary | ICD-10-CM | POA: Insufficient documentation

## 2016-12-10 DIAGNOSIS — Z87891 Personal history of nicotine dependence: Secondary | ICD-10-CM | POA: Diagnosis not present

## 2016-12-10 DIAGNOSIS — I10 Essential (primary) hypertension: Secondary | ICD-10-CM | POA: Insufficient documentation

## 2016-12-10 DIAGNOSIS — Z90710 Acquired absence of both cervix and uterus: Secondary | ICD-10-CM | POA: Diagnosis not present

## 2016-12-10 DIAGNOSIS — Z79890 Hormone replacement therapy (postmenopausal): Secondary | ICD-10-CM | POA: Insufficient documentation

## 2016-12-10 DIAGNOSIS — E039 Hypothyroidism, unspecified: Secondary | ICD-10-CM | POA: Insufficient documentation

## 2016-12-10 DIAGNOSIS — E785 Hyperlipidemia, unspecified: Secondary | ICD-10-CM | POA: Insufficient documentation

## 2016-12-10 DIAGNOSIS — Z8542 Personal history of malignant neoplasm of other parts of uterus: Secondary | ICD-10-CM | POA: Diagnosis not present

## 2016-12-10 DIAGNOSIS — Z6833 Body mass index (BMI) 33.0-33.9, adult: Secondary | ICD-10-CM | POA: Insufficient documentation

## 2016-12-10 DIAGNOSIS — Z8249 Family history of ischemic heart disease and other diseases of the circulatory system: Secondary | ICD-10-CM | POA: Insufficient documentation

## 2016-12-10 DIAGNOSIS — K219 Gastro-esophageal reflux disease without esophagitis: Secondary | ICD-10-CM | POA: Insufficient documentation

## 2016-12-10 DIAGNOSIS — C541 Malignant neoplasm of endometrium: Secondary | ICD-10-CM

## 2016-12-10 DIAGNOSIS — R5383 Other fatigue: Secondary | ICD-10-CM | POA: Diagnosis not present

## 2016-12-10 DIAGNOSIS — M1990 Unspecified osteoarthritis, unspecified site: Secondary | ICD-10-CM | POA: Diagnosis not present

## 2016-12-10 DIAGNOSIS — Z08 Encounter for follow-up examination after completed treatment for malignant neoplasm: Secondary | ICD-10-CM | POA: Insufficient documentation

## 2016-12-10 NOTE — Progress Notes (Signed)
ENDOMETRIAL CANCER FOLLOW-UP VISIT  Assessment:   61 y.o.  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   S/p robotic assisted total hysterectomy, BSO, sentinel lymph node biopsy on 05/02/15 with post-op imaging suggestive of involved lymph nodes. S/p chemoradiation with carboplatin and paclitaxel x 6 cycles and sandwich external beam pelvic radiation (completed 10/24/15) with chemotherapy completed 11/27/15.  Reports fatigue and weight gain.  No vaginal or rectal bleeding.  Soft stools  Complete clinical response.  NED Plan: 1) Treatment counseling -discussed that she is disease free on evaluation. Recommend surveillance with exam in 35months.  2)  Left renal lesion - suspicious for malignancy. Evaluated by Urology, follow-up scheduled for 1 year  3)  follow-up: Dr Sondra Come 02/2017 as scheduled and Dr Skeet Latch 05/2017 Per SGO guidelines for f/u of endometrial cancer,pap tests  are not longer recommended. 4) Fatigue.  Advised to call if fatigue worsens or is associated wth weight loss or new symptoms.  Advised to incease water intake and physical activity is possible  HPI:  Lydia Walsh is a 61 y.o. year old G3P3. initially seen in consultation on 03/29/15 referred by Dr Radene Knee for grade 2 endometrial cancer.  She then underwent a robotic assisted total hysterectomy, BSO, sentinel lymph node biopsy with Dr Skeet Latch on 7/67/34 without complications.  Her postoperative course was uncomplicated.  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  Postoperative imaging on 05/24/15 showed mild left comon iliac adenopathy. This region had not been sampled surgically. On PET scan on 06/12/15 there was increased uptake at the left common iliac node consistent with metastastatic disease. A 1.8cm  left lower pole renal lesion was also identified.  She was diagnosed with clinical stage IIIC disease and was prescribed sandwiched chemotherapy and radiation. She received 6 cycles of carboplatin and paclitaxel between 06/26/15 and 11/27/15 with whole pelvic external beam radiation administered after 3 cycles from July through 10/24/15.   She tolerated treatment well.   On post-treatment CT abdo/pelvis on 12/27/15 showed: No residual lymphadenopathy or other evidence of metastatic endometrial carcinoma. 2. Persistent hypodense mass involving the lower pole of the left kidney, likely enhancing as correlated with previous PET-CT. This remains worrisome for renal cell carcinoma.  Followed by urology  Mammogram 2018 wnl  Past Medical History:  Diagnosis Date  . Anemia   . Cancer [facility])    endometrial cancer  . Endometrial polyp   . GERD (gastroesophageal reflux disease)   . Heart murmur   . Heart palpitations   . History of blood transfusion   . History of chemotherapy   . History of radiation therapy   . History of uterine fibroid   . Hyperlipidemia   . Hypertension   . Hypothyroidism   . OSA (obstructive sleep apnea)    moderate OSA per study 06-21-2005--  per pt does need cpap anymore never purchased it  . Osteoarthritis   . PMB (postmenopausal bleeding)   . Wears dentures    upper  . Wears glasses    Past Surgical History:  Procedure Laterality Date  . COLONOSCOPY    . DILATATION & CURETTAGE/HYSTEROSCOPY WITH MYOSURE N/A 03/07/2015   Procedure: DILATATION & CURETTAGE/HYSTEROSCOPY WITH MYOSURE (POLYP);  Surgeon: Arvella Nigh, MD;  Location: [facility];  Service: Gynecology;  Laterality: N/A;  . DILATATION & CURRETTAGE/HYSTEROSCOPY  WITH RESECTOCOPE  12-13-2002  &  09-26-2004   polyp/  submucosal fibroid  . ESOPHAGOGASTRODUODENOSCOPY    . EXCISION BENIGN RIGHT BREAST MASS  age 61  . IR REMOVAL TUN ACCESS W/ PORT W/O FL MOD SED  07/17/2016  . KNEE ARTHROSCOPY W/  MENISCECTOMY Bilateral right 04-12-2006//  left 12-29-2006   and Chondroplasty  . port placement    . ROBOTIC ASSISTED TOTAL HYSTERECTOMY WITH BILATERAL SALPINGO OOPHERECTOMY Bilateral 05/02/2015   Procedure: XI ROBOTIC ASSISTED TOTAL HYSTERECTOMY WITH BILATERAL SALPINGO OOPHORECTOMY AND SENTINAL LYMPH NODE BIOPSY;  Surgeon: Janie Morning, MD;  Location: [facility];  Service: Gynecology;  Laterality: Bilateral;  . TRANSTHORACIC ECHOCARDIOGRAM  04-02-2010   normal LV, ef 65%  . TUBAL LIGATION  1980's   Family History  Problem Relation Age of Onset  . Hypertension Mother   . Cirrhosis Father        alcoholic  . Colon cancer Neg Hx   . Stomach cancer Neg Hx   . Rectal cancer Neg Hx   . Esophageal cancer Neg Hx   . Liver cancer Neg Hx    Social History   Socioeconomic History  . Marital status: Widowed    Spouse name: Not on file  . Number of children: 3  . Years of education: Not on file  . Highest education level: Not on file  Social Needs  . Financial resource strain: Not on file  . Food insecurity - worry: Not on file  . Food insecurity - inability: Not on file  . Transportation needs - medical: Not on file  . Transportation needs - non-medical: Not on file  Occupational History  . Occupation: Production manager, [employer]  Tobacco Use  . Smoking status: Former Smoker    Years: 2.00    Types: Cigarettes    Last attempt to quit: 02/28/1994    Years since quitting: 22.7  . Smokeless tobacco: Never Used  Substance and Sexual Activity  . Alcohol use: No    Alcohol/week: 0.0 oz  . Drug use: No  . Sexual activity: No    Birth control/protection: Post-menopausal  Other Topics Concern  . 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 Hctz [Hydrochlorothiazide] Rash   Current Outpatient Medications on File Prior to Visit  Medication  Sig Dispense Refill  . allopurinol (ZYLOPRIM) 100 MG tablet TAKE ONE TABLET BY MOUTH ONCE DAILY 90 tablet 1  . chlorpheniramine-HYDROcodone (TUSSIONEX PENNKINETIC ER) 10-8 MG/5ML SUER Take 5 mLs every 12 (twelve) hours as needed by mouth for cough. 140 mL 0  . dextromethorphan 15 MG/5ML syrup Take 10 mLs (30 mg total) 4 (four) times daily as needed by mouth for cough. 240 mL 0  . diclofenac (VOLTAREN) 75 MG EC tablet Take 1 tablet (75 mg total) 2 (two) times daily by mouth. 30 tablet 2  . diclofenac sodium (VOLTAREN) 1 % GEL Apply 2 g 4 (four) times daily topically. 1 Tube 5  . fluticasone (FLONASE) 50 MCG/ACT nasal spray Spray 1-2 sprays per nostril per day as needed for treatment of allergies. 16 g 5  . ibuprofen (ADVIL,MOTRIN) 200 MG tablet Take 200 mg by mouth as needed.    Marland Kitchen levothyroxine (SYNTHROID) 112 MCG tablet Take 1 tablet (112 mcg total) by mouth daily. 30 tablet 3  . metoprolol succinate (TOPROL  XL) 50 MG 24 hr tablet Take 1 tablet (50 mg total) by mouth daily. Take with or immediately following a meal. 30 tablet 11  . metoprolol succinate (TOPROL-XL) 25 MG 24 hr tablet Take 1 tablet (25 mg total) by mouth daily. 30 tablet 11  . omeprazole (PRILOSEC) 20 MG capsule Take 1 capsule (20 mg total) by mouth daily. 30 capsule 11  . potassium chloride (K-DUR) 10 MEQ tablet Take 1 tablet (10 mEq total) by mouth daily. 90 tablet 3  . simvastatin (ZOCOR) 20 MG tablet Take 1 tablet (20 mg total) by mouth every evening. 30 tablet 11  . spironolactone (ALDACTONE) 25 MG tablet Take 2 tablets (50 mg total) by mouth daily. D/c previous spironolactone prescription 180 tablet 3  . [DISCONTINUED] esomeprazole (NEXIUM) 20 MG capsule Take 2 capsules (40 mg total) by mouth daily before breakfast. 30 capsule 3   No current facility-administered medications on file prior to visit.      Review of systems: Constitutional:  She has no weight gain or weight loss. She has no fever or chills. Reports new onset  fatigue and weight gain Eyes: No blurred vision Ears, Nose, Mouth, Throat: No dizziness, headaches or changes in hearing. No mouth sores. Cardiovascular: No chest pain, palpitations or edema. Respiratory:  No shortness of breath, wheezing or cough Gastrointestinal: She has normal bowel movements. She denies any nausea or vomiting. She denies blood in her stool or heart burn. Genitourinary:  She denies pelvic pain, pelvic pressure or changes in her urinary function. She has no hematuria, dysuria, or incontinence. She has no  vaginal bleeding or vaginal discharge Musculoskeletal: Denies muscle weakness or joint pains.  Skin:  She has no skin changes, rashes or itching Neurological:  Denies dizziness or headaches. No neuropathy, no numbness or tingling. Psychiatric:  She denies depression or anxiety. Hematologic/Lymphatic:   No easy bruising or bleeding   Physical Exam: Blood pressure 132/67, pulse 72, temperature 98 F (36.7 C), temperature source Oral, resp. rate 20, height 5' (1.524 m), weight 170 lb 6.4 oz (77.3 kg), last menstrual period 07/16/2014, SpO2 100 %. BP 132/67 (BP Location: Left Arm, Patient Position: Sitting)   Pulse 72   Temp 98 F (36.7 C) (Oral)   Resp 20   Ht 5' (1.524 m)   Wt 170 lb 6.4 oz (77.3 kg)   LMP 07/16/2014   SpO2 100%   BMI 33.28 kg/m  Wt Readings from Last 3 Encounters:  12/10/16 170 lb 6.4 oz (77.3 kg)  11/18/16 171 lb 12.8 oz (77.9 kg)  09/22/16 170 lb 11.2 oz (77.4 kg)   General: Well dressed, well nourished in no apparent distress.   HEENT:  Normocephalic and atraumatic, no lesions.  Sclerae anicteric. Lungs:  Clear to auscultation bilaterally.  No wheezes. Cardiovascular:  Regular rate and rhythm.   Abdomen:  Soft, nontender, nondistended.  No palpable masses.  No hepatosplenomegaly.  No ascites. Normal bowel sounds.  No hernias.  Incisions are well healed.  No masses or hernia or tenderness at port sites Genitourinary: Normal EGBUS  Vaginal  cuff intact.  No bleeding or discharge.  No cul de sac fullness, vaginal agglutination present and was disrupted on digital examination, no nodularity proximal to agglutination.  Atrophic,  Rectal: Good tone no masses no cul-de-sac nodularity  Extremities: No cyanosis, clubbing or edema.  No calf tenderness or erythema.  Psychiatric: Mood and affect are appropriate. Neurological: Awake, alert and oriented x 3. Musculoskeletal: No pain, normal strength and gait

## 2016-12-10 NOTE — Patient Instructions (Signed)
Call Dr. Leone Brand office if you notice any weight loss with fatigue. Increase in exercise to help with fatigue.

## 2017-01-13 ENCOUNTER — Other Ambulatory Visit: Payer: Self-pay

## 2017-01-13 MED ORDER — LEVOTHYROXINE SODIUM 112 MCG PO TABS: 112.0000 ug | ORAL_TABLET | Freq: Every day | ORAL | 3 refills | Status: DC

## 2017-01-13 NOTE — Telephone Encounter (Signed)
levothyroxine (SYNTHROID) 112 MCG tablet, Refill request @ [facility] on [address].

## 2017-03-05 ENCOUNTER — Other Ambulatory Visit: Payer: Self-pay | Admitting: *Deleted

## 2017-03-08 ENCOUNTER — Other Ambulatory Visit: Payer: Self-pay

## 2017-03-08 ENCOUNTER — Encounter: Payer: Self-pay | Admitting: Radiation Oncology

## 2017-03-08 ENCOUNTER — Ambulatory Visit
Admission: RE | Admit: 2017-03-08 | Discharge: 2017-03-08 | Disposition: A | Discharge status: Home / Self Care | Payer: BLUE CROSS/BLUE SHIELD | Source: Ambulatory Visit | Attending: Radiation Oncology | Admitting: Radiation Oncology

## 2017-03-08 VITALS — BP 148/58 | HR 66 | Temp 98.0°F | Resp 18 | Wt 169.6 lb

## 2017-03-08 DIAGNOSIS — R5383 Other fatigue: Secondary | ICD-10-CM | POA: Insufficient documentation

## 2017-03-08 DIAGNOSIS — Z8542 Personal history of malignant neoplasm of other parts of uterus: Secondary | ICD-10-CM | POA: Insufficient documentation

## 2017-03-08 DIAGNOSIS — C541 Malignant neoplasm of endometrium: Secondary | ICD-10-CM

## 2017-03-08 DIAGNOSIS — Z79899 Other long term (current) drug therapy: Secondary | ICD-10-CM | POA: Diagnosis not present

## 2017-03-08 DIAGNOSIS — Z08 Encounter for follow-up examination after completed treatment for malignant neoplasm: Secondary | ICD-10-CM | POA: Diagnosis not present

## 2017-03-08 DIAGNOSIS — R197 Diarrhea, unspecified: Secondary | ICD-10-CM | POA: Insufficient documentation

## 2017-03-08 DIAGNOSIS — Z923 Personal history of irradiation: Secondary | ICD-10-CM | POA: Diagnosis not present

## 2017-03-08 MED ORDER — ALLOPURINOL 100 MG PO TABS: 100.0000 mg | ORAL_TABLET | Freq: Every day | ORAL | 1 refills | Status: DC

## 2017-03-08 NOTE — Progress Notes (Signed)
Radiation Oncology         ([phone]) [phone] ________________________________  Name: Lydia Walsh MRN: [id number]  Date: 03/08/2017  DOB: [birth year]    Follow-Up Visit Note  CC: Thomasene Ripple, MD  Gordy Levan, MD    ICD-10-CM   1. International Federation 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 ([place]) C54.1   2. Endometrial cancer ([place]) C54.1     Diagnosis: Stage IIIC-1 (pT1b, pN0) grade 2 endometrioid adenocarcinoma    Interval Since Last Radiation:  1 year 4 months  1) 09/02/15-10/08/15: External beam radiation to the pelvis - 45 Gy in 25 fractions, simultaneous integrated boost to suspicious node 50 Gy  2) 10/15/15, 10/22/15, 10/24/15 - HDR vaginal brachytherapy - 18 Gy in 3 fractions.  Narrative:  The patient returns today for routine follow-up. She denies pain at this time. She reports using the vaginal dilator just recently in the past month. She denies vaginal discharge or vaginal bleeding. She reports diarrhea x 2 day but states she feels diet contributes to it. She denies urinary frequency, burning, discharge, or odor. She complains of chronic fatigue.   She was last seen by Dr. Skeet Latch on 12/10/16. Per her note, there was no evidence of disease.  ALLERGIES:  is allergic to hctz [hydrochlorothiazide].  Meds: Current Outpatient Medications  Medication Sig Dispense Refill  . allopurinol (ZYLOPRIM) 100 MG tablet Take 1 tablet (100 mg total) by mouth daily. 90 tablet 1  . levothyroxine (SYNTHROID) 112 MCG tablet Take 1 tablet (112 mcg total) by mouth daily. 30 tablet 3  . metoprolol succinate (TOPROL XL) 50 MG 24 hr tablet Take 1 tablet (50 mg total) by mouth daily. Take with or immediately following a meal. 30 tablet 11  . omeprazole (PRILOSEC) 20 MG capsule Take 1 capsule (20 mg total) by mouth daily. 30 capsule 11  . potassium chloride (K-DUR) 10 MEQ tablet Take 1 tablet (10 mEq total) by mouth daily. 90 tablet 3  .  simvastatin (ZOCOR) 20 MG tablet Take 1 tablet (20 mg total) by mouth every evening. 30 tablet 11  . spironolactone (ALDACTONE) 25 MG tablet Take 2 tablets (50 mg total) by mouth daily. D/c previous spironolactone prescription 180 tablet 3  . fluticasone (FLONASE) 50 MCG/ACT nasal spray Spray 1-2 sprays per nostril per day as needed for treatment of allergies. 16 g 5  . ibuprofen (ADVIL,MOTRIN) 200 MG tablet Take 200 mg by mouth as needed.     No current facility-administered medications for this encounter.     Physical Findings: The patient is in no acute distress. Patient is alert and oriented.  weight is 169 lb 9.6 oz (76.9 kg). Her oral temperature is 98 F (36.7 C). Her blood pressure is 148/58 (abnormal) and her pulse is 66. Her respiration is 18 and oxygen saturation is 100%.   Lungs are clear to auscultation bilaterally. Heart has regular rate and rhythm. No palpable cervical, supraclavicular, or axillary adenopathy. Abdomen soft, non-tender, normal bowel sounds.  On initial exam the vaginal vault was narrowed and shortened. Adhesions were gently released and view of the vaginal cuff was obtained showing no mucosal lesions at the cuff or other areas of the vaginal wall. On bimanual and rectovaginal examination there were no pelvic masses appreciated.  Lab Findings: Lab Results  Component Value Date   WBC 4.8 07/21/2016   HGB 12.8 07/21/2016   HCT 38.5 07/21/2016   MCV 91.3 07/21/2016   PLT 190 07/21/2016  Radiographic Findings: No results found.  Impression:  No evidence of recurrence on clinical exam.  Plan: The patient is scheduled to follow up with Dr. Skeet Latch on 05/13/17. She will follow up with radiation oncology in 6 months.  -----------------------------------  Blair Promise, PhD, MD  This document serves as a record of services personally performed by Gery Pray, MD. It was created on his behalf by Bethann Humble, a trained medical scribe. The creation of this  record is based on the scribe's personal observations and the provider's statements to them. This document has been checked and approved by the attending provider.

## 2017-03-08 NOTE — Progress Notes (Signed)
Lydia Walsh is here today for her follow up regarding endometrial cancer.  Patient denies having any pain.  She reports using the vaginal dilator just recently in the past month.  Denies having any vaginal discharge, denies vaginal bleeding.  Reports diarrhea X 2 day but states that she feels diet contributes to it (ice-cream).  Denies having any urinary frequency, burning, discharge or odor.    Vitals:   03/08/17 0930  BP: (!) 148/58  Pulse: 66  Resp: 18  Temp: 98 F (36.7 C)  TempSrc: Oral  SpO2: 100%  Weight: 169 lb 9.6 oz (76.9 kg)   Wt Readings from Last 3 Encounters:  03/08/17 169 lb 9.6 oz (76.9 kg)  12/10/16 170 lb 6.4 oz (77.3 kg)  11/18/16 171 lb 12.8 oz (77.9 kg)

## 2017-03-24 ENCOUNTER — Other Ambulatory Visit: Payer: Self-pay | Admitting: Hematology and Oncology

## 2017-03-24 DIAGNOSIS — Z1231 Encounter for screening mammogram for malignant neoplasm of breast: Secondary | ICD-10-CM

## 2017-04-01 DIAGNOSIS — H04123 Dry eye syndrome of bilateral lacrimal glands: Secondary | ICD-10-CM | POA: Diagnosis not present

## 2017-04-01 DIAGNOSIS — H43813 Vitreous degeneration, bilateral: Secondary | ICD-10-CM | POA: Diagnosis not present

## 2017-04-01 DIAGNOSIS — H2513 Age-related nuclear cataract, bilateral: Secondary | ICD-10-CM | POA: Diagnosis not present

## 2017-04-01 DIAGNOSIS — H40013 Open angle with borderline findings, low risk, bilateral: Secondary | ICD-10-CM | POA: Diagnosis not present

## 2017-04-20 ENCOUNTER — Ambulatory Visit: Payer: BLUE CROSS/BLUE SHIELD

## 2017-04-23 ENCOUNTER — Ambulatory Visit: Payer: BLUE CROSS/BLUE SHIELD

## 2017-05-17 ENCOUNTER — Ambulatory Visit
Admission: RE | Admit: 2017-05-17 | Discharge: 2017-05-17 | Disposition: A | Discharge status: Home / Self Care | Payer: BLUE CROSS/BLUE SHIELD | Source: Ambulatory Visit | Attending: Hematology and Oncology | Admitting: Hematology and Oncology

## 2017-05-17 DIAGNOSIS — Z1231 Encounter for screening mammogram for malignant neoplasm of breast: Secondary | ICD-10-CM | POA: Diagnosis not present

## 2017-05-18 ENCOUNTER — Other Ambulatory Visit: Payer: Self-pay | Admitting: *Deleted

## 2017-05-19 MED ORDER — LEVOTHYROXINE SODIUM 112 MCG PO TABS: 112.0000 ug | ORAL_TABLET | Freq: Every day | ORAL | 3 refills | Status: DC

## 2017-05-27 ENCOUNTER — Inpatient Hospital Stay: Payer: BLUE CROSS/BLUE SHIELD | Attending: Gynecologic Oncology | Admitting: Gynecologic Oncology

## 2017-05-27 ENCOUNTER — Encounter: Payer: Self-pay | Admitting: Gynecologic Oncology

## 2017-05-27 VITALS — BP 154/91 | HR 66 | Temp 98.4°F | Resp 20 | Ht 60.0 in | Wt 178.4 lb

## 2017-05-27 DIAGNOSIS — Z8543 Personal history of malignant neoplasm of ovary: Secondary | ICD-10-CM | POA: Diagnosis not present

## 2017-05-27 DIAGNOSIS — Z90722 Acquired absence of ovaries, bilateral: Secondary | ICD-10-CM | POA: Diagnosis not present

## 2017-05-27 DIAGNOSIS — Z90710 Acquired absence of both cervix and uterus: Secondary | ICD-10-CM

## 2017-05-27 DIAGNOSIS — Z08 Encounter for follow-up examination after completed treatment for malignant neoplasm: Secondary | ICD-10-CM | POA: Diagnosis not present

## 2017-05-27 DIAGNOSIS — Z923 Personal history of irradiation: Secondary | ICD-10-CM | POA: Diagnosis not present

## 2017-05-27 DIAGNOSIS — Z8542 Personal history of malignant neoplasm of other parts of uterus: Secondary | ICD-10-CM | POA: Insufficient documentation

## 2017-05-27 DIAGNOSIS — C541 Malignant neoplasm of endometrium: Secondary | ICD-10-CM

## 2017-05-27 DIAGNOSIS — Z9221 Personal history of antineoplastic chemotherapy: Secondary | ICD-10-CM | POA: Diagnosis not present

## 2017-05-27 NOTE — Progress Notes (Signed)
ENDOMETRIAL CANCER FOLLOW-UP VISIT  Assessment:   62 y.o.  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   S/p robotic assisted total hysterectomy, BSO, sentinel lymph node biopsy on 05/02/15 with post-op imaging suggestive of involved lymph nodes. S/p chemoradiation with carboplatin and paclitaxel x 6 cycles and sandwich external beam pelvic radiation (completed 10/24/15) with chemotherapy completed 11/27/15.  Complete clinical response.  NED NED  Plan: 1) Treatment counseling -discussed that she is disease free on evaluation. Recommend surveillance with exam in 5months.  2)  Left renal lesion - suspicious for malignancy. Evaluated by Urology, follow-up scheduled for 1 year  3)  follow-up: Dr Sondra Come 02/2017 as scheduled and Dr Skeet Latch 05/2017 Per [facility] guidelines for f/u of endometrial cancer,pap tests  are not longer recommended. 4) Fatigue.  Advised to call if fatigue worsens or is associated wth weight loss or new symptoms.  Advised to incease water intake and physical activity is possible  HPI:  Lydia Walsh is a 62 y.o. year old G3P3. initially seen in consultation on 03/29/15 referred by Dr Radene Knee for grade 2 endometrial cancer.  She then underwent a robotic assisted total hysterectomy, BSO, sentinel lymph node biopsy with Dr Skeet Latch on 06/15/52 without complications.  Her postoperative course was uncomplicated.  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  Postoperative imaging on 05/24/15 showed mild left comon iliac adenopathy. This region had not been sampled surgically. On PET scan on 06/12/15 there was increased uptake at the left common iliac node consistent with metastastatic disease. A 1.8cm left lower pole renal lesion was also identified.  She was diagnosed with  clinical stage IIIC disease and was prescribed sandwiched chemotherapy and radiation. She received 6 cycles of carboplatin and paclitaxel between 06/26/15 and 11/27/15 with whole pelvic external beam radiation administered after 3 cycles from July through 10/24/15.   She tolerated treatment well.   On post-treatment CT abdo/pelvis on 12/27/15 showed: No residual lymphadenopathy or other evidence of metastatic endometrial carcinoma. 2. Persistent hypodense mass involving the lower pole of the left kidney, likely enhancing as correlated with previous PET-CT. This remains worrisome for renal cell carcinoma.  Followed by urology    Past Medical History:  Diagnosis Date  . Anemia   . Cancer [facility])    endometrial cancer  . Endometrial polyp   . GERD (gastroesophageal reflux disease)   . Heart murmur   . Heart palpitations   . History of blood transfusion   . History of chemotherapy   . History of radiation therapy   . History of uterine fibroid   . Hyperlipidemia   . Hypertension   . Hypothyroidism   . OSA (obstructive sleep apnea)    moderate OSA per study 06-21-2005--  per pt does need cpap anymore never purchased it  . Osteoarthritis   . PMB (postmenopausal bleeding)   . Wears dentures    upper  . Wears glasses    Past Surgical History:  Procedure Laterality Date  . BREAST EXCISIONAL BIOPSY Right    benign  . COLONOSCOPY    . DILATATION & CURETTAGE/HYSTEROSCOPY WITH MYOSURE N/A 03/07/2015   Procedure: DILATATION & CURETTAGE/HYSTEROSCOPY WITH MYOSURE (POLYP);  Surgeon: Arvella Nigh, MD;  Location: [facility];  Service: Gynecology;  Laterality: N/A;  . DILATATION & CURRETTAGE/HYSTEROSCOPY WITH RESECTOCOPE  12-13-2002  &  09-26-2004   polyp/  submucosal fibroid  . ESOPHAGOGASTRODUODENOSCOPY    . EXCISION BENIGN RIGHT BREAST MASS  age 51  . IR REMOVAL TUN ACCESS W/ PORT W/O FL MOD SED  07/17/2016  . KNEE ARTHROSCOPY W/ MENISCECTOMY Bilateral right 04-12-2006//  left  12-29-2006   and Chondroplasty  . port placement    . ROBOTIC ASSISTED TOTAL HYSTERECTOMY WITH BILATERAL SALPINGO OOPHERECTOMY Bilateral 05/02/2015   Procedure: XI ROBOTIC ASSISTED TOTAL HYSTERECTOMY WITH BILATERAL SALPINGO OOPHORECTOMY AND SENTINAL LYMPH NODE BIOPSY;  Surgeon: Janie Morning, MD;  Location: [facility];  Service: Gynecology;  Laterality: Bilateral;  . TRANSTHORACIC ECHOCARDIOGRAM  04-02-2010   normal LV, ef 65%  . TUBAL LIGATION  1980's   Family History  Problem Relation Age of Onset  . Hypertension Mother   . Cirrhosis Father        alcoholic  . Colon cancer Neg Hx   . Stomach cancer Neg Hx   . Rectal cancer Neg Hx   . Esophageal cancer Neg Hx   . Liver cancer Neg Hx    Social History   Socioeconomic History  . Marital status: Widowed    Spouse name: Not on file  . Number of children: 3  . Years of education: Not on file  . Highest education level: Not on file  Occupational History  . Occupation: Production manager, [employer]  Social Needs  . Financial resource strain: Not on file  . Food insecurity:    Worry: Not on file    Inability: Not on file  . Transportation needs:    Medical: Not on file    Non-medical: Not on file  Tobacco Use  . Smoking status: Former Smoker    Years: 2.00    Types: Cigarettes    Last attempt to quit: 02/28/1994    Years since quitting: 23.2  . Smokeless tobacco: Never Used  Substance and Sexual Activity  . Alcohol use: No    Alcohol/week: 0.0 oz  . Drug use: No  . Sexual activity: Never    Birth control/protection: Post-menopausal  Lifestyle  . Physical activity:    Days per week: Not on file    Minutes per session: Not on file  . Stress: Not on file  Relationships  . Social connections:    Talks on phone: Not on file    Gets together: Not on file    Attends religious service: Not on file    Active member of club or organization: Not on file    Attends meetings of clubs or organizations: Not on file     Relationship status: Not on file  . Intimate partner violence:    Fear of current or ex partner: Not on file    Emotionally abused: Not on file    Physically abused: Not on file    Forced sexual activity: Not on file  Other Topics Concern  . Not on file  Social History Narrative   Widowed she has 3 sons one recently passed with heart issues.   She is employed with security at [employer]  . Hctz [Hydrochlorothiazide] Rash   Current Outpatient Medications on File Prior to Visit  Medication Sig Dispense Refill  . allopurinol (ZYLOPRIM) 100 MG tablet Take 1 tablet (100 mg total) by mouth daily. 90 tablet 1  . ibuprofen (ADVIL,MOTRIN) 200 MG tablet Take 200 mg by mouth as needed.    Marland Kitchen levothyroxine (SYNTHROID) 112 MCG tablet Take 1 tablet (112  mcg total) by mouth daily. 30 tablet 3  . metoprolol succinate (TOPROL XL) 50 MG 24 hr tablet Take 1 tablet (50 mg total) by mouth daily. Take with or immediately following a meal. 30 tablet 11  . omeprazole (PRILOSEC) 20 MG capsule Take 1 capsule (20 mg total) by mouth daily. 30 capsule 11  . OVER THE COUNTER MEDICATION Alive Women's 50+ Gummie Multivitamin    . potassium chloride (K-DUR) 10 MEQ tablet Take 1 tablet (10 mEq total) by mouth daily. 90 tablet 3  . simvastatin (ZOCOR) 20 MG tablet Take 1 tablet (20 mg total) by mouth every evening. 30 tablet 11  . spironolactone (ALDACTONE) 25 MG tablet Take 2 tablets (50 mg total) by mouth daily. D/c previous spironolactone prescription 180 tablet 3  . fluticasone (FLONASE) 50 MCG/ACT nasal spray Spray 1-2 sprays per nostril per day as needed for treatment of allergies. 16 g 5  . [DISCONTINUED] esomeprazole (NEXIUM) 20 MG capsule Take 2 capsules (40 mg total) by mouth daily before breakfast. 30 capsule 3   No current facility-administered medications on file prior to visit.      Review of systems: Constitutional:  She has no weight gain or weight loss. She has no  fever or chills. Reports new onset fatigue and weight gain Eyes: No blurred vision Ears, Nose, Mouth, Throat: No dizziness, headaches or changes in hearing. No mouth sores. Cardiovascular: No chest pain, palpitations or edema. Respiratory:  No shortness of breath, wheezing or cough Gastrointestinal: She has normal bowel movements. She denies any nausea or vomiting. She denies blood in her stool or heart burn. Genitourinary:  She denies pelvic pain, pelvic pressure or changes in her urinary function. She has no hematuria, dysuria, or incontinence. She has no  vaginal bleeding or vaginal discharge Musculoskeletal: Denies muscle weakness or joint pains.  Skin:  She has no skin changes, rashes or itching Neurological:  Denies dizziness or headaches. No neuropathy, no numbness or tingling. Psychiatric:  She denies depression or anxiety. Hematologic/Lymphatic:   No easy bruising or bleeding   Physical Exam: Blood pressure (!) 154/91, pulse 66, temperature 98.4 F (36.9 C), temperature source Oral, resp. rate 20, height 5' (1.524 m), weight 178 lb 6.4 oz (80.9 kg), last menstrual period 07/16/2014, SpO2 99 %. BP (!) 154/91 (BP Location: Left Arm, Patient Position: Sitting)   Pulse 66   Temp 98.4 F (36.9 C) (Oral)   Resp 20   Ht 5' (1.524 m)   Wt 178 lb 6.4 oz (80.9 kg)   LMP 07/16/2014   SpO2 99%   BMI 34.84 kg/m  Wt Readings from Last 3 Encounters:  05/27/17 178 lb 6.4 oz (80.9 kg)  03/08/17 169 lb 9.6 oz (76.9 kg)  12/10/16 170 lb 6.4 oz (77.3 kg)   General: Well dressed, well nourished in no apparent distress.   HEENT:  Normocephalic and atraumatic, no lesions.  Sclerae anicteric. Lungs:  Clear to auscultation bilaterally.  No wheezes. Cardiovascular:  Regular rate and rhythm.   Abdomen:  Soft, nontender, nondistended.  No palpable masses.  No hepatosplenomegaly.  No ascites. Normal bowel sounds.  No hernias.  Incisions are well healed.  No masses or hernia or tenderness at port  sites Genitourinary: Normal EGBUS  Vaginal cuff intact.  No bleeding or discharge.  No cul de sac fullness, vaginal agglutination present and was disrupted on digital examination with scant bleeding, no nodularity proximal to agglutination.  Atrophic,  Rectal: Good tone no masses no cul-de-sac nodularity  Extremities: No cyanosis, clubbing or edema.  No calf tenderness or erythema.  Psychiatric: Mood and affect are appropriate. Neurological: Awake, alert and oriented x 3. Musculoskeletal: No pain, normal strength and gait

## 2017-05-27 NOTE — Patient Instructions (Signed)
Plan to follow up in three months or sooner if needed.  Please call for any questions, concerns, or new symptoms including vaginal spotting/bleeding.

## 2017-05-29 ENCOUNTER — Ambulatory Visit (HOSPITAL_COMMUNITY)
Admission: EM | Admit: 2017-05-29 | Discharge: 2017-05-29 | Disposition: A | Discharge status: Home / Self Care | Payer: BLUE CROSS/BLUE SHIELD | Attending: Physician Assistant | Admitting: Physician Assistant

## 2017-05-29 ENCOUNTER — Encounter (HOSPITAL_COMMUNITY): Payer: Self-pay | Admitting: Emergency Medicine

## 2017-05-29 DIAGNOSIS — M25531 Pain in right wrist: Secondary | ICD-10-CM

## 2017-05-29 DIAGNOSIS — S63501A Unspecified sprain of right wrist, initial encounter: Secondary | ICD-10-CM

## 2017-05-29 NOTE — ED Triage Notes (Signed)
Pt sts right wrist pain after exerting last night at work

## 2017-05-29 NOTE — Discharge Instructions (Signed)
Please wear the splint for at least 2 weeks and take Tylenol 1000 mg every 8 hours as needed for wrist pain.

## 2017-05-29 NOTE — ED Provider Notes (Signed)
05/29/2017 12:46 PM   DOB: August 26, 1955 / MRN: [id number]  SUBJECTIVE:  Lydia Walsh is a 62 y.o. 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  She tells me that the pain started after her most recent shift and she associates some mild swelling.  She is taken ibuprofen for this with some mild relief.  She is allergic to hctz [hydrochlorothiazide].   She  has a past medical history of Anemia, Cancer ([place]), Endometrial polyp, GERD (gastroesophageal reflux disease), Heart murmur, Heart palpitations, History of blood transfusion, History of chemotherapy, History of radiation therapy, History of uterine fibroid, Hyperlipidemia, Hypertension, Hypothyroidism, OSA (obstructive sleep apnea), Osteoarthritis, PMB (postmenopausal bleeding), Wears dentures, and Wears glasses.    She  reports that she quit smoking about 23 years ago. Her smoking use included cigarettes. She quit after 2.00 years of use. She has never used smokeless tobacco. She reports that she does not drink alcohol or use drugs. She  reports that she does not engage in sexual activity. The patient  has a past surgical history that includes Knee arthroscopy w/ meniscectomy (Bilateral, right 04-12-2006//  left 12-29-2006); EXCISION BENIGN RIGHT BREAST MASS (age 60); Dilatation & currettage/hysteroscopy with resectoscope (12-13-2002  &  09-26-2004); transthoracic echocardiogram (04-02-2010); Tubal ligation (1980's); Dilatation & curettage/hysteroscopy with myosure (N/A, 03/07/2015); Robotic assisted total hysterectomy with bilateral salpingo oophorectomy (Bilateral, 05/02/2015); Esophagogastroduodenoscopy; Colonoscopy; port placement; IR REMOVAL TUN ACCESS W/ PORT W/O FL MOD SED (07/17/2016); and Breast excisional biopsy (Right).  Her family history includes Cirrhosis in her father; Hypertension in her mother.  Review of Systems  Musculoskeletal:  Positive for joint pain. Negative for back pain, falls, myalgias and neck pain.    OBJECTIVE:  BP 137/82 (BP Location: Left Arm)   Pulse 72   Temp (!) 97 F (36.1 C) (Oral)   Resp 16   LMP 07/16/2014   SpO2 96%   Physical Exam  Constitutional: She is oriented to person, place, and time. She appears well-nourished. No distress.  Eyes: Pupils are equal, round, and reactive to light. EOM are normal.  Cardiovascular: Normal rate.  Pulmonary/Chest: Effort normal.  Abdominal: She exhibits no distension.  Musculoskeletal:       Right wrist: She exhibits decreased range of motion, tenderness and swelling. She exhibits no bony tenderness, no effusion, no crepitus, no deformity and no laceration.       Arms: Neurological: She is alert and oriented to person, place, and time. No cranial nerve deficit. Gait normal.  Skin: Skin is dry. She is not diaphoretic.  Psychiatric: She has a normal mood and affect.  Vitals reviewed.   No results found for this or any previous visit (from the past 72 hour(s)).  No results found.  ASSESSMENT AND PLAN:  No orders of the defined types were placed in this encounter.    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  For pain she should take Tylenol 1000 mg every 8 hours as needed.  Exam is reassuring.  Sprain of right wrist, initial encounter      The patient is advised to call or return to clinic if she does not see an improvement in symptoms, or to seek the care of the closest emergency department  if she worsens with the above plan.   Philis Fendt, MHS, PA-C 05/29/2017 12:46 PM    Tereasa Coop, PA-C 05/29/17 1249

## 2017-06-01 ENCOUNTER — Other Ambulatory Visit: Payer: Self-pay | Admitting: Internal Medicine

## 2017-06-01 MED ORDER — FLUTICASONE PROPIONATE 50 MCG/ACT NA SUSP: NASAL | 5 refills | Status: DC

## 2017-06-01 NOTE — Telephone Encounter (Signed)
Refill Request   fluticasone (FLONASE) 50 MCG/ACT nasal spray(Expired)

## 2017-06-30 ENCOUNTER — Encounter: Payer: Self-pay | Admitting: *Deleted

## 2017-07-02 ENCOUNTER — Telehealth: Payer: Self-pay

## 2017-07-02 ENCOUNTER — Telehealth: Payer: Self-pay | Admitting: Hematology and Oncology

## 2017-07-02 NOTE — Telephone Encounter (Signed)
Patient scheduled per 6/21 sch message. Patient aware of d&t.

## 2017-07-02 NOTE — Telephone Encounter (Signed)
She called and left a message to call her.  Called back. She is feeling tired all the time. She sees Dr. Alvy Bimler on 7/11. She feels like her iron may be low. She has taken iron tablets in the past and is asking the office to call Rx to her pharmacy.

## 2017-07-02 NOTE — Telephone Encounter (Signed)
She needs labs and follow-up appt Please schedule I do not recommend resuming iron until labs are available

## 2017-07-02 NOTE — Telephone Encounter (Signed)
Called and given below message. She verbalized understanding. She will come in 6/24 at 11 am for labs then see Dr. Alvy Bimler at [phone]. Scheduling message sent.

## 2017-07-05 ENCOUNTER — Encounter: Payer: Self-pay | Admitting: Hematology and Oncology

## 2017-07-05 ENCOUNTER — Telehealth: Payer: Self-pay | Admitting: Hematology and Oncology

## 2017-07-05 ENCOUNTER — Inpatient Hospital Stay (HOSPITAL_BASED_OUTPATIENT_CLINIC_OR_DEPARTMENT_OTHER): Payer: BLUE CROSS/BLUE SHIELD | Admitting: Hematology and Oncology

## 2017-07-05 ENCOUNTER — Inpatient Hospital Stay: Payer: BLUE CROSS/BLUE SHIELD | Attending: Gynecologic Oncology

## 2017-07-05 ENCOUNTER — Other Ambulatory Visit: Payer: Self-pay | Admitting: Hematology and Oncology

## 2017-07-05 DIAGNOSIS — K8020 Calculus of gallbladder without cholecystitis without obstruction: Secondary | ICD-10-CM | POA: Diagnosis not present

## 2017-07-05 DIAGNOSIS — C541 Malignant neoplasm of endometrium: Secondary | ICD-10-CM

## 2017-07-05 DIAGNOSIS — R5383 Other fatigue: Secondary | ICD-10-CM

## 2017-07-05 DIAGNOSIS — Z9221 Personal history of antineoplastic chemotherapy: Secondary | ICD-10-CM | POA: Insufficient documentation

## 2017-07-05 DIAGNOSIS — Z923 Personal history of irradiation: Secondary | ICD-10-CM | POA: Diagnosis not present

## 2017-07-05 DIAGNOSIS — Z8542 Personal history of malignant neoplasm of other parts of uterus: Secondary | ICD-10-CM

## 2017-07-05 DIAGNOSIS — Z79899 Other long term (current) drug therapy: Secondary | ICD-10-CM | POA: Insufficient documentation

## 2017-07-05 DIAGNOSIS — I2510 Atherosclerotic heart disease of native coronary artery without angina pectoris: Secondary | ICD-10-CM

## 2017-07-05 DIAGNOSIS — N2889 Other specified disorders of kidney and ureter: Secondary | ICD-10-CM | POA: Diagnosis not present

## 2017-07-05 DIAGNOSIS — D500 Iron deficiency anemia secondary to blood loss (chronic): Secondary | ICD-10-CM

## 2017-07-05 LAB — CBC WITH DIFFERENTIAL/PLATELET
BASOS ABS: 0 10*3/uL (ref 0.0–0.1)
BASOS PCT: 1 %
Eosinophils Absolute: 0.1 10*3/uL (ref 0.0–0.5)
Eosinophils Relative: 2 %
HEMATOCRIT: 36.8 % (ref 34.8–46.6)
HEMOGLOBIN: 12 g/dL (ref 11.6–15.9)
Lymphocytes Relative: 18 %
Lymphs Abs: 0.8 10*3/uL — ABNORMAL LOW (ref 0.9–3.3)
MCH: 29.2 pg (ref 25.1–34.0)
MCHC: 32.5 g/dL (ref 31.5–36.0)
MCV: 89.9 fL (ref 79.5–101.0)
MONOS PCT: 4 %
Monocytes Absolute: 0.2 10*3/uL (ref 0.1–0.9)
NEUTROS ABS: 3.5 10*3/uL (ref 1.5–6.5)
NEUTROS PCT: 75 %
Platelets: 204 10*3/uL (ref 145–400)
RBC: 4.1 MIL/uL (ref 3.70–5.45)
RDW: 14.3 % (ref 11.2–14.5)
WBC: 4.6 10*3/uL (ref 3.9–10.3)

## 2017-07-05 LAB — IRON AND TIBC
Iron: 52 ug/dL (ref 41–142)
SATURATION RATIOS: 16 % — AB (ref 21–57)
TIBC: 329 ug/dL (ref 236–444)
UIBC: 276 ug/dL

## 2017-07-05 LAB — COMPREHENSIVE METABOLIC PANEL
ALT: 16 U/L (ref 0–55)
ANION GAP: 11 (ref 3–11)
AST: 19 U/L (ref 5–34)
Albumin: 4 g/dL (ref 3.5–5.0)
Alkaline Phosphatase: 102 U/L (ref 40–150)
BUN: 15 mg/dL (ref 7–26)
CALCIUM: 9.6 mg/dL (ref 8.4–10.4)
CHLORIDE: 104 mmol/L (ref 98–109)
CO2: 26 mmol/L (ref 22–29)
Creatinine, Ser: 0.79 mg/dL (ref 0.60–1.10)
GFR calc non Af Amer: >60 mL/min (ref >60)
Glucose, Bld: 100 mg/dL (ref 70–140)
POTASSIUM: 3.8 mmol/L (ref 3.5–5.1)
SODIUM: 141 mmol/L (ref 136–145)
Total Bilirubin: 0.5 mg/dL (ref 0.2–1.2)
Total Protein: 7.5 g/dL (ref 6.4–8.3)

## 2017-07-05 LAB — TSH: TSH: 8.321 u[IU]/mL — ABNORMAL HIGH (ref 0.308–3.960)

## 2017-07-05 LAB — SEDIMENTATION RATE: Sed Rate: 45 mm/hr — ABNORMAL HIGH (ref 0–22)

## 2017-07-05 LAB — FERRITIN: FERRITIN: 90 ng/mL (ref 9–269)

## 2017-07-05 NOTE — Assessment & Plan Note (Signed)
Clinically, she has no signs of cancer recurrence She will continue close follow-up with GYN oncologist and radiation oncologist

## 2017-07-05 NOTE — Telephone Encounter (Signed)
Per 6/24 los, no orders.

## 2017-07-05 NOTE — Assessment & Plan Note (Signed)
She complained of excessive fatigue and was concerned about iron deficiency anemia Iron level is pending She is not anemic TSH level is pending She has appointment to see GI service next week for follow-up I will call the patient with test results For now, I reassured her that the excessive fatigue is not related to potential iron deficiency anemia, more likely due to poor sleep I recommend against caffeinated beverages late in the day

## 2017-07-05 NOTE — Progress Notes (Signed)
[place] OFFICE PROGRESS NOTE  Patient Care Team: Asencion Noble, MD as PCP - General Plyler, Chauncey Reading, RD as Dietitian (Dietician)  ASSESSMENT & PLAN:  Endometrial cancer [facility]) Clinically, she has no signs of cancer recurrence She will continue close follow-up with GYN oncologist and radiation oncologist  Fatigue She complained of excessive fatigue and was concerned about iron deficiency anemia Iron level is pending She is not anemic TSH level is pending She has appointment to see GI service next week for follow-up I will call the patient with test results For now, I reassured her that the excessive fatigue is not related to potential iron deficiency anemia, more likely due to poor sleep I recommend against caffeinated beverages late in the day  Renal mass, left She has been referred to see urologist for further follow-up She denies recent hematuria   No orders of the defined types were placed in this encounter.   INTERVAL HISTORY: Please see below for problem oriented charting. She returns for further follow-up She complained of excessive fatigue and is wondering about potential iron deficiency anemia She sleeps poorly at night She usually drinks coffee throughout the day She is taking thyroid replacement therapy and is uncertain when TSH was last checked She denies nausea, abdominal bloating or changes in bowel habits No vaginal bleeding  SUMMARY OF ONCOLOGIC HISTORY: Oncology History   MSI stable     Endometrial cancer ([place])   03/31/2014 Procedure    Cholelithiasis without complicating factors. Fatty liver      02/12/2015 Initial Diagnosis    Patient had ~ 6 months of post menopausal bleeding, at times heavy, prior to reestablishing with Dr Arvella Nigh. [place] 02-12-15 showed slightly enlarged uterus with multiple intramural fibroids up to 3.5 cm, endometrial thickness 4 mm, polypoid lesion in cavity, ovaries grossly normal      03/07/2015  Pathology Results    1. Endometrium, resection - ENDOMETRIAL ADENOCARCINOMA. 2. Endometrium, curettage - SCANT ENDOMETRIAL STROMA AND BENIGN GLANDS. - NO HYPERPLASIA OR MALIGNANCY. Microscopic Comment 1. There is endometrioid adenocarcinoma with foci of squamous differentiation, FIGO grade 2.      03/07/2015 Procedure    She underwent D & C      03/07/2015 Surgery    Paracervical block.  Cervical dilation. Hysteroscopy with MyoSure resection of endometrial thickening and polyps.  Endometrial curettings      05/02/2015 Pathology Results    1. Lymph node, sentinel, biopsy, right external iliac vein sentinel - TWO BENIGN LYMPH NODES (0/2). 2. Lymph node, sentinel, biopsy, left external iliac sentinel - BENIGN ADIPOSE TISSUE. - NO LYMPH NODE TISSUE OR MALIGNANCY. 3. Uterus +/- tubes/ovaries, neoplastic, cervix - ENDOMETRIAL ADENOCARCINOMA EXTENDING INTO THE DEEP MYOMETRIUM. - NO CERVICAL, BILATERAL FALLOPIAN TUBES OR BILATERAL OVARY INVOLVEMENT. - MARGINS NOT INVOLVED.  Specimen: Uterus with bilateral fallopian tubes and ovaries and right and left external iliac sentinel lymph nodes. Procedure: Hysterectomy with bilateral salpingo-oophorectomy and lymph node sampling. Lymph node sampling performed: Yes. Specimen integrity: Intact. Maximum tumor size: 7 cm Histologic type: Endometrioid adenocarcinoma. Grade: FIGO grade 2. Myometrial invasion: 1.4 cm where myometrium is 1.5 cm in thickness Cervical stromal involvement: No. Extent of involvement of other organs: None detected. Lymph - vascular invasion: Present, focal. Peritoneal washings: Pending. Lymph nodes: # examined 2. ; # positive 0 Pelvic lymph nodes: 0 involved of 2 lymph nodes. Para-aortic lymph nodes: N/A involved of N/A lymph nodes. Other (specify involvement and site): 0 TNM code: pT1b, pN0 FIGO Stage (based  on pathologic findings, needs clinical correlation): IB Comment: Immunohistochemistry for cytokeratin AE1/AE3  is performed on the two right external iliac nodes and no positivity is identified.   3. Mismatch Repair (MMR) Protein Immunohistochemistry (IHC) IHC Expression Result: MLH1: Preserved nuclear expression (greater 50% tumor expression) MSH2: Preserved nuclear expression (greater 50% tumor expression) MSH6: Preserved nuclear expression (greater 50% tumor expression) PMS2: Preserved nuclear expression (greater 50% tumor expression) * Internal control demonstrates intact nuclear expression Interpretation: NORMAL There is preserved expression of the major and minor MMR proteins. There is a very low probability that microsatellite instability (MSI) is present. However, certain clinically significant MMR protein mutations may result in preservation of nuclear expression. It is recommended that the preservation of protein expression be correlated with molecular based MSI testing      05/02/2015 Surgery    Operation: Robotic-assisted laparoscopic hysterectomy with bilateral salpingoophorectomy bilateral sentinel lymph node sampling, lysis of adhesions  Operative Findings:  1.  No extrauterine disease. 2.  A 8 cm uterus. 3.  Normal bilateral adnexa. 4.  No evidence of any extrauterine disease. 5.  Normal appearing cervix.         05/24/2015 Imaging    Ct abdomen: 1. Mild left common iliac lymphadenopathy. Borderline prominent rounded low aortocaval node. These nodes are suspicious for metastatic disease. 2. No residual or recurrent tumor at the vaginal cuff. No adnexal mass. No visceral metastases. 3. Indeterminate dense 1.6 cm renal mass in the lower left kidney, for which renal cell carcinoma is to be excluded. Renal mass protocol MRI abdomen without and with IV contrast is recommended for further evaluation. If MRI is not clinically feasible, a renal mass protocol CT abdomen without and with IV contrast is recommended. 4. Additional findings include coronary atherosclerosis, cholelithiasis,  nonobstructing right renal stone and moderate sigmoid diverticulosis.      06/12/2015 PET scan    1. 10 mm left common iliac lymph node is mildly hypermetabolic, worrisome for metastatic disease. Additional sub cm lymph nodes seen described on 05/24/2015 may be too small for PET resolution. 2. Focal hypermetabolism along the lower left lateral abdominal musculature may be postoperative in etiology. 3. Mildly hyperdense lesion in the lower pole left kidney, also seen and described on 05/24/2015. Please refer to that report for further details and recommendations. 4. Coronary artery calcification. 5. Cholelithiasis.      06/26/2015 - 11/27/2015 Chemotherapy    She received 6 cycles of carbo/Taxol      07/15/2015 Procedure    Status post right IJ port catheter placement. Catheter ready for use      09/02/2015 - 10/24/2015 Radiation Therapy    1) 09/02/15-10/08/15: External beam radiation to the pelvis - 50 Gy in 25 fractions 2) 10/15/15, 10/22/15, 10/24/15 - HDR vaginal brachytherapy - 18 Gy in 3 fractions.      12/27/2015 Imaging    Ct abdomen 1. No residual lymphadenopathy or other evidence of metastatic endometrial carcinoma. 2. Persistent hypodense mass involving the lower pole of the left kidney, likely enhancing as correlated with previous PET-CT. This remains worrisome for renal cell carcinoma. Again, renal mass protocol MRI abdomen without and with contrast recommended. 3. Stable incidental findings including cholelithiasis, atherosclerosis, distal colonic diverticulosis and lumbar spondylosis.       02/17/2016 Miscellaneous    She saw Dr. Tresa Moore for follow-up related to abnormal renal lesion seen on imaging      07/17/2016 Procedure    Technically successful tunneled Port catheter removal.  REVIEW OF SYSTEMS:   Constitutional: Denies fevers, chills or abnormal weight loss Eyes: Denies blurriness of vision Ears, nose, mouth, throat, and face: Denies mucositis or sore  throat Respiratory: Denies cough, dyspnea or wheezes Cardiovascular: Denies palpitation, chest discomfort or lower extremity swelling Gastrointestinal:  Denies nausea, heartburn or change in bowel habits Skin: Denies abnormal skin rashes Lymphatics: Denies new lymphadenopathy or easy bruising Neurological:Denies numbness, tingling or new weaknesses Behavioral/Psych: Mood is stable, no new changes  All other systems were reviewed with the patient and are negative.  I have reviewed the past medical history, past surgical history, social history and family history with the patient and they are unchanged from previous note.  ALLERGIES:  is allergic to hctz [hydrochlorothiazide].  MEDICATIONS:  Current Outpatient Medications  Medication Sig Dispense Refill  . allopurinol (ZYLOPRIM) 100 MG tablet Take 1 tablet (100 mg total) by mouth daily. 90 tablet 1  . fluticasone (FLONASE) 50 MCG/ACT nasal spray Spray 1-2 sprays per nostril per day as needed for treatment of allergies. 16 g 5  . ibuprofen (ADVIL,MOTRIN) 200 MG tablet Take 200 mg by mouth as needed.    Marland Kitchen levothyroxine (SYNTHROID) 112 MCG tablet Take 1 tablet (112 mcg total) by mouth daily. 30 tablet 3  . metoprolol succinate (TOPROL XL) 50 MG 24 hr tablet Take 1 tablet (50 mg total) by mouth daily. Take with or immediately following a meal. 30 tablet 11  . omeprazole (PRILOSEC) 20 MG capsule Take 1 capsule (20 mg total) by mouth daily. 30 capsule 11  . OVER THE COUNTER MEDICATION Alive Women's 50+ Gummie Multivitamin    . potassium chloride (K-DUR) 10 MEQ tablet Take 1 tablet (10 mEq total) by mouth daily. 90 tablet 3  . simvastatin (ZOCOR) 20 MG tablet Take 1 tablet (20 mg total) by mouth every evening. 30 tablet 11  . spironolactone (ALDACTONE) 25 MG tablet Take 2 tablets (50 mg total) by mouth daily. D/c previous spironolactone prescription 180 tablet 3   No current facility-administered medications for this visit.     PHYSICAL  EXAMINATION: ECOG PERFORMANCE STATUS: 1 - Symptomatic but completely ambulatory  Vitals:   07/05/17 1038  BP: (!) 160/64  Pulse: 73  Resp: 18  Temp: 98.4 F (36.9 C)  SpO2: 100%   Filed Weights   07/05/17 1038  Weight: 181 lb 14.4 oz (82.5 kg)    GENERAL:alert, no distress and comfortable SKIN: skin color, texture, turgor are normal, no rashes or significant lesions EYES: normal, Conjunctiva are pink and non-injected, sclera clear OROPHARYNX:no exudate, no erythema and lips, buccal mucosa, and tongue normal  NECK: supple, thyroid normal size, non-tender, without nodularity LYMPH:  no palpable lymphadenopathy in the cervical, axillary or inguinal LUNGS: clear to auscultation and percussion with normal breathing effort HEART: regular rate & rhythm and no murmurs and no lower extremity edema ABDOMEN:abdomen soft, non-tender and normal bowel sounds Musculoskeletal:no cyanosis of digits and no clubbing  NEURO: alert & oriented x 3 with fluent speech, no focal motor/sensory deficits  LABORATORY DATA:  I have reviewed the data as listed    Component Value Date/Time   NA 140 07/21/2016 0827   K 3.7 07/21/2016 0827   CL 107 07/17/2016 0752   CO2 24 07/21/2016 0827   GLUCOSE 120 07/21/2016 0827   BUN 16.2 07/21/2016 0827   CREATININE 0.8 07/21/2016 0827   CALCIUM 10.4 07/21/2016 0827   PROT 7.7 07/21/2016 0827   ALBUMIN 4.0 07/21/2016 0827   AST 16 07/21/2016 0827  ALT 18 07/21/2016 0827   ALKPHOS 108 07/21/2016 0827   BILITOT 0.70 07/21/2016 0827   GFRNONAA >60 07/17/2016 0752   GFRNONAA >89 07/26/2014 1027   GFRAA >60 07/17/2016 0752   GFRAA >89 07/26/2014 1027    No results found for: SPEP, UPEP  Lab Results  Component Value Date   WBC 4.6 07/05/2017   NEUTROABS 3.5 07/05/2017   HGB 12.0 07/05/2017   HCT 36.8 07/05/2017   MCV 89.9 07/05/2017   PLT 204 07/05/2017      Chemistry      Component Value Date/Time   NA 140 07/21/2016 0827   K 3.7 07/21/2016  0827   CL 107 07/17/2016 0752   CO2 24 07/21/2016 0827   BUN 16.2 07/21/2016 0827   CREATININE 0.8 07/21/2016 0827      Component Value Date/Time   CALCIUM 10.4 07/21/2016 0827   ALKPHOS 108 07/21/2016 0827   AST 16 07/21/2016 0827   ALT 18 07/21/2016 0827   BILITOT 0.70 07/21/2016 0827      All questions were answered. The patient knows to call the clinic with any problems, questions or concerns. No barriers to learning was detected.  I spent 15 minutes counseling the patient face to face. The total time spent in the appointment was 20 minutes and more than 50% was on counseling and review of test results  Heath Lark, MD 07/05/2017 11:12 AM

## 2017-07-05 NOTE — Assessment & Plan Note (Signed)
She has been referred to see urologist for further follow-up She denies recent hematuria

## 2017-07-06 ENCOUNTER — Telehealth: Payer: Self-pay

## 2017-07-06 LAB — CA 125: CANCER ANTIGEN (CA) 125: 15.2 U/mL (ref 0.0–38.1)

## 2017-07-06 NOTE — Telephone Encounter (Signed)
-----   Message from Heath Lark, MD sent at 07/06/2017  8:32 AM EDT ----- Regarding: final labs Let her know all labs are good except her thyroid test is abnormal. COuld be the cause of her fatigue. She needs to contact her primary doctor/doctor who prescribed her thyroid medication for an appointment to adjust her medication

## 2017-07-06 NOTE — Telephone Encounter (Signed)
Called and given below message. She verbalized understanding. She states she will call her MD.

## 2017-07-08 ENCOUNTER — Ambulatory Visit: Payer: BLUE CROSS/BLUE SHIELD | Admitting: Internal Medicine

## 2017-07-08 ENCOUNTER — Other Ambulatory Visit: Payer: Self-pay

## 2017-07-08 VITALS — BP 141/65 | HR 71 | Temp 98.4°F | Ht 60.0 in | Wt 182.3 lb

## 2017-07-08 DIAGNOSIS — Z79890 Hormone replacement therapy (postmenopausal): Secondary | ICD-10-CM | POA: Diagnosis not present

## 2017-07-08 DIAGNOSIS — E039 Hypothyroidism, unspecified: Secondary | ICD-10-CM

## 2017-07-08 DIAGNOSIS — I10 Essential (primary) hypertension: Secondary | ICD-10-CM

## 2017-07-08 DIAGNOSIS — C541 Malignant neoplasm of endometrium: Secondary | ICD-10-CM

## 2017-07-08 DIAGNOSIS — G4733 Obstructive sleep apnea (adult) (pediatric): Secondary | ICD-10-CM | POA: Diagnosis not present

## 2017-07-08 MED ORDER — LEVOTHYROXINE SODIUM 125 MCG PO TABS: 125.0000 ug | ORAL_TABLET | Freq: Every day | ORAL | 0 refills | Status: DC

## 2017-07-08 NOTE — Assessment & Plan Note (Signed)
Patient presents for evaluation of her hypothyroidism after her TSH was checked at her oncologist office.  TSH on 07/05/2017 was 8.321.  Patient is currently taking levothyroxine 112 mcg daily.  She reports feelings of fatigue, low energy, cold intolerance, and increased weight. A/P: Her TSH is only mildly elevated, however she may be experiencing symptoms of mildly undertreated hypothyroidism.  We will increase her dose to 125 mcg daily and order repeat TSH check in 6 weeks.  Of note she was previously overtreated on 125 mcg daily about 1 and half years ago.  If her repeat TSH is low on 125 mcg daily, we should err on the side of maintaining her at a slightly undertreated hypothyroid level as overtreatment can result in more significant side effects (I.e Afib, etc). -Levothyroxine 125 mcg daily -Repeat TSH 6 weeks

## 2017-07-08 NOTE — Patient Instructions (Addendum)
It was a pleasure to see you Lydia Walsh.  Your thyroid level was mildly out of the normal range.  I will increase your Levothyroxine slightly to 125 mcg once daily.  Please return in 6 weeks for a repeat lab check and follow up with your PCP in about 6 months or sooner if needed.

## 2017-07-08 NOTE — Progress Notes (Signed)
   CC: Hypothyroidism  HPI:  [name] is a 62 y.o. female with HTN, Hypothyroidism, OSA, and Endometrial Cancer who presents for follow up management of her hypothyroidism. Please see problem based charting for status of patient's chronic medical issues.  Hypothyroidism Patient presents for evaluation of her hypothyroidism after her TSH was checked at her oncologist office.  TSH on 07/05/2017 was 8.321.  Patient is currently taking levothyroxine 112 mcg daily.  She reports feelings of fatigue, low energy, cold intolerance, and increased weight. A/P: Her TSH is only mildly elevated, however she may be experiencing symptoms of mildly undertreated hypothyroidism.  We will increase her dose to 125 mcg daily and order repeat TSH check in 6 weeks.  Of note she was previously overtreated on 125 mcg daily about 1 and half years ago.  If her repeat TSH is low on 125 mcg daily, we should err on the side of maintaining her at a slightly undertreated hypothyroid level as overtreatment can result in more significant side effects (I.e Afib, etc). -Levothyroxine 125 mcg daily -Repeat TSH 6 weeks    Past Medical History:  Diagnosis Date  . Anemia   . Cancer [facility])    endometrial cancer  . Endometrial polyp   . GERD (gastroesophageal reflux disease)   . Heart murmur   . Heart palpitations   . History of blood transfusion   . History of chemotherapy   . History of radiation therapy   . History of uterine fibroid   . Hyperlipidemia   . Hypertension   . Hypothyroidism   . OSA (obstructive sleep apnea)    moderate OSA per study 06-21-2005--  per pt does need cpap anymore never purchased it  . Osteoarthritis   . PMB (postmenopausal bleeding)   . Wears dentures    upper  . Wears glasses    Review of Systems:   Review of Systems  Constitutional: Positive for malaise/fatigue.       Weight gain, cold intolerance  Respiratory: Negative for shortness of breath.   Cardiovascular: Negative for  chest pain.  Gastrointestinal: Negative for constipation.     Physical Exam:  Vitals:   07/08/17 0915  BP: (!) 141/65  Pulse: 71  Temp: 98.4 F (36.9 C)  TempSrc: Oral  SpO2: 98%  Weight: 182 lb 4.8 oz (82.7 kg)  Height: 5' (1.524 m)   Physical Exam  Constitutional: She is oriented to person, place, and time. She appears well-nourished. No distress.  Neck: No thyroid mass and no thyromegaly present.  Cardiovascular: Normal rate and regular rhythm.  No murmur heard. Pulmonary/Chest: Effort normal. No respiratory distress. She has no wheezes. She has no rales.  Neurological: She is alert and oriented to person, place, and time.  Skin: Skin is warm. She is not diaphoretic.  Psychiatric: She has a normal mood and affect.     Assessment & Plan:   See Encounters Tab for problem based charting.  Patient discussed with Dr. Daryll Drown

## 2017-07-12 ENCOUNTER — Encounter: Payer: Self-pay | Admitting: Internal Medicine

## 2017-07-12 ENCOUNTER — Ambulatory Visit: Payer: BLUE CROSS/BLUE SHIELD | Admitting: Internal Medicine

## 2017-07-12 VITALS — BP 132/80 | HR 72 | Ht <= 58 in | Wt 182.5 lb

## 2017-07-12 DIAGNOSIS — N2889 Other specified disorders of kidney and ureter: Secondary | ICD-10-CM

## 2017-07-12 DIAGNOSIS — K580 Irritable bowel syndrome with diarrhea: Secondary | ICD-10-CM

## 2017-07-12 DIAGNOSIS — E739 Lactose intolerance, unspecified: Secondary | ICD-10-CM

## 2017-07-12 DIAGNOSIS — K8020 Calculus of gallbladder without cholecystitis without obstruction: Secondary | ICD-10-CM | POA: Diagnosis not present

## 2017-07-12 NOTE — Progress Notes (Signed)
Lydia Walsh 62 y.o. 1955-02-16 [id number]  Assessment & Plan:   Encounter Diagnoses  Name Primary?  . Irritable bowel syndrome with diarrhea Yes  . Symptomatic cholelithiasis   . Lactose intolerance in adult suspected   . Left renal mass suspicious for renal cell carcinoma on imaging     I think her problems are multifactorial.  I want her to return to see Dr. Georgette Dover as I think cholecystectomy would benefit her.  I want her to avoid corn and the frappe's and watch dairy intake and other fried and greasy foods which also bother her.  She was given samples of IBgard to try as needed.  She was given a handout of lactose intolerance information and she can consider using lactase.  However a steady diet of the frappe's is not healthy eating and she should stop those and other unhealthy foods we did discuss.  I do not think the renal mass is related to her GI symptoms.  Though it could impact timing of cholecystectomy. She may return to see me as needed  I appreciate the opportunity to care for this patient. CC: Asencion Noble, MD Heath Lark MD [facility]  Subjective:   Chief Complaint: Abdominal pain and diarrhea  HPI The patient presents for follow-up, I saw her last year and performed a negative (diverticulosis) screening colonoscopy and thought she had symptomatic gallstones.  She saw Dr. Georgette Dover of surgery and he wanted to perform a cholecystectomy after she went to her colonoscopy but somehow she never followed up.  She is complaining of bilateral mid abdominal cramps and loose stools at times she notices it after eating corn and [employer] frappe's.  She also thinks dairy like ice cream may give her similar symptoms.  She also still has intermittent right upper quadrant and infrascapular postprandial pain.  We know she has gallstones on ultrasound and CT scanning.  I have reviewed the 2018 consult note with Dr. Georgette Dover.  Wt Readings from Last 3 Encounters:  07/12/17 182  lb 8 oz (82.8 kg)  07/08/17 182 lb 4.8 oz (82.7 kg)  07/05/17 181 lb 14.4 oz (82.5 kg)   She also has a left renal mass that is been there for some time but no hematuria, it was followed through her postoperative chemotherapy and radiation therapy for uterine cancer.  She is supposed to go back and see urology.  Allergies  Allergen Reactions  . Hctz [Hydrochlorothiazide] Rash   Current Meds  Medication Sig  . allopurinol (ZYLOPRIM) 100 MG tablet Take 1 tablet (100 mg total) by mouth daily.  . fluticasone (FLONASE) 50 MCG/ACT nasal spray Spray 1-2 sprays per nostril per day as needed for treatment of allergies.  Marland Kitchen ibuprofen (ADVIL,MOTRIN) 200 MG tablet Take 200 mg by mouth as needed.  Marland Kitchen levothyroxine (SYNTHROID, LEVOTHROID) 125 MCG tablet Take 1 tablet (125 mcg total) by mouth daily.  . metoprolol succinate (TOPROL XL) 50 MG 24 hr tablet Take 1 tablet (50 mg total) by mouth daily. Take with or immediately following a meal.  . omeprazole (PRILOSEC) 20 MG capsule Take 1 capsule (20 mg total) by mouth daily.  Marland Kitchen OVER THE COUNTER MEDICATION Alive Women's 50+ Gummie Multivitamin  . Peppermint Oil (IBGARD PO) Take by mouth daily as needed.  . potassium chloride (K-DUR) 10 MEQ tablet Take 1 tablet (10 mEq total) by mouth daily.  . simvastatin (ZOCOR) 20 MG tablet Take 1 tablet (20 mg total) by mouth every evening.  Marland Kitchen spironolactone (ALDACTONE) 25  MG tablet Take 2 tablets (50 mg total) by mouth daily. D/c previous spironolactone prescription   Past Medical History:  Diagnosis Date  . Anemia   . Cancer [facility])    endometrial cancer  . Endometrial polyp   . GERD (gastroesophageal reflux disease)   . Heart murmur   . Heart palpitations   . History of blood transfusion   . History of chemotherapy   . History of radiation therapy   . History of uterine fibroid   . Hyperlipidemia   . Hypertension   . Hypothyroidism   . OSA (obstructive sleep apnea)    moderate OSA per study 06-21-2005--  per  pt does need cpap anymore never purchased it  . Osteoarthritis   . PMB (postmenopausal bleeding)   . Wears dentures    upper  . Wears glasses    Past Surgical History:  Procedure Laterality Date  . BREAST EXCISIONAL BIOPSY Right    benign  . COLONOSCOPY    . DILATATION & CURETTAGE/HYSTEROSCOPY WITH MYOSURE N/A 03/07/2015   Procedure: DILATATION & CURETTAGE/HYSTEROSCOPY WITH MYOSURE (POLYP);  Surgeon: Arvella Nigh, MD;  Location: [facility];  Service: Gynecology;  Laterality: N/A;  . DILATATION & CURRETTAGE/HYSTEROSCOPY WITH RESECTOCOPE  12-13-2002  &  09-26-2004   polyp/  submucosal fibroid  . ESOPHAGOGASTRODUODENOSCOPY    . EXCISION BENIGN RIGHT BREAST MASS  age 37  . IR REMOVAL TUN ACCESS W/ PORT W/O FL MOD SED  07/17/2016  . KNEE ARTHROSCOPY W/ MENISCECTOMY Bilateral right 04-12-2006//  left 12-29-2006   and Chondroplasty  . port placement    . ROBOTIC ASSISTED TOTAL HYSTERECTOMY WITH BILATERAL SALPINGO OOPHERECTOMY Bilateral 05/02/2015   Procedure: XI ROBOTIC ASSISTED TOTAL HYSTERECTOMY WITH BILATERAL SALPINGO OOPHORECTOMY AND SENTINAL LYMPH NODE BIOPSY;  Surgeon: Janie Morning, MD;  Location: [facility];  Service: Gynecology;  Laterality: Bilateral;  . TRANSTHORACIC ECHOCARDIOGRAM  04-02-2010   normal LV, ef 65%  . TUBAL LIGATION  1980's   Social History   Social History Narrative   Widowed she has 3 sons one died from heart issues.   She is employed with security at [employer]   No alcohol tobacco or drug use   family history includes Cirrhosis in her father; Heart disease in her son; Hypertension in her mother.   Review of Systems As per HPI  Objective:   Physical Exam BP 132/80 (BP Location: Left Arm, Patient Position: Sitting, Cuff Size: Normal)   Pulse 72   Ht 4\' 10"  (1.473 m) Comment: height measured without shoes  Wt 182 lb 8 oz (82.8 kg)   LMP 07/16/2014   BMI 38.14 kg/m  Obese bw NAD Eyes anicteric abd soft and NT BS + Upper back ribs  nontender Appropriate mood/affect  Data reviewed includes prior colonoscopy report surgical notes recent labs

## 2017-07-12 NOTE — Patient Instructions (Signed)
  You have been scheduled for an appointment with Dr Donnie Mesa at [facility]. Your appointment is on _______________ at ____________. Please arrive at _________________ for registration. Make certain to bring a list of current medications, including any over the counter medications or vitamins. Also bring your co-pay if you have one as well as your insurance cards. [facility] is located at [address]. Should you need to reschedule your appointment, please contact them at [phone].   We are providing you with Lactose intolerance information to read and follow.   We are giving you several boxes of IBgard to try.   Try to avoid the foods that bother you such as corn and [employer] Frapps.    I appreciate the opportunity to care for you. [name], MD, [facility]

## 2017-07-12 NOTE — Progress Notes (Signed)
Internal Medicine Clinic Attending  Case discussed with Dr. Patel at the time of the visit.  We reviewed the resident's history and exam and pertinent patient test results.  I agree with the assessment, diagnosis, and plan of care documented in the resident's note.  

## 2017-07-22 ENCOUNTER — Ambulatory Visit: Payer: BLUE CROSS/BLUE SHIELD | Admitting: Hematology and Oncology

## 2017-07-30 ENCOUNTER — Ambulatory Visit: Payer: Self-pay | Admitting: Surgery

## 2017-07-30 DIAGNOSIS — K8010 Calculus of gallbladder with chronic cholecystitis without obstruction: Secondary | ICD-10-CM | POA: Diagnosis not present

## 2017-07-30 NOTE — H&P (Signed)
History of Present Illness Lydia Walsh. Lydia Novinger MD; 07/30/2017 11:48 AM) The patient is a 62 year old female who presents for evaluation of gall stones. Referred by Dr. [name] for symptomatic gallstones  This is a 62 year old female who presented to Dr. [name] recently with some dysphasia and some intermittent postprandial upper abdominal pain. The dysphagia has improved on PPI. The patient has known that she has had gallstones for several years. 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 She has completed chemotherapy and radiation therapy. Recent CT scan showed no sign of malignancy but she does have gallstones. The patient states that she has had intermittent postprandial right upper quadrant abdominal pain over the last several years but recently has become more frequent and more severe. These episodes to the last couple of hours. She does experience some mild nausea. Rarely does she have diarrhea. She does report some slight abdominal bloating.  She was seen in April 2018. She was scheduled for a colonoscopy and we recommended laparoscopic cholecystectomy after colonoscopy. She was lost to follow-up and have continued. She is unable only any foods that have fat. She has recurrent right upper quadrant tenderness, distention, diarrhea.  CLINICAL DATA: Endometrial carcinoma diagnosed in 2017. Total hysterectomy. Chemotherapy and radiation therapy completed last month.  EXAM: CT ABDOMEN AND PELVIS WITH CONTRAST  TECHNIQUE: Multidetector CT imaging of the abdomen and pelvis was performed using the standard protocol following bolus administration of intravenous contrast.  CONTRAST: 157mL ISOVUE-300 IOPAMIDOL (ISOVUE-300) INJECTION 61%  COMPARISON: Abdominopelvic CT 05/24/2015. PET-CT 06/12/2015.  FINDINGS: Lower chest: Clear lung bases. There is no pleural or pericardial effusion. Central venous catheter tip in the upper right atrium. There is  atherosclerosis of the thoracic aorta and coronary arteries.  Hepatobiliary: The liver appears stable without suspicious finding. There is no biliary dilatation or gallbladder wall thickening. Multiple noncalcified gallstones are present within the gallbladder lumen.  Pancreas: Mildly atrophied without focal abnormality or surrounding inflammation.  Spleen: Normal in size without focal abnormality. Small splenule.  Adrenals/Urinary Tract: Both adrenal glands appear normal. There is stable cortical scarring in the upper pole the right kidney. Persistent hypodense mass involving the lower pole the left kidney, measuring approximately 15 x 14 mm on image 35. This measures 68 HU on the immediate postcontrast images and 63 HU on the delayed images. This lesion remains concerning for renal cell carcinoma. There is a tiny cyst anteriorly in the mid left kidney. No hydronephrosis. The bladder is nearly empty without apparent abnormality.  Stomach/Bowel: No evidence of bowel wall thickening, distention or surrounding inflammatory change. There are diverticular changes throughout the distal colon, greatest in the sigmoid. No evidence of active inflammation. The appendix appears normal.  Vascular/Lymphatic: There are no residual enlarged abdominopelvic lymph nodes. Stable aortic and branch vessel atherosclerosis.  Reproductive: Hysterectomy. Trace free pelvic fluid. No evidence of adnexal mass.  Other: No generalized ascites or peritoneal nodularity. Stable small umbilical hernia containing only fat.  Musculoskeletal: No acute or significant osseous findings. Multilevel spondylosis again noted.  IMPRESSION: 1. No residual lymphadenopathy or other evidence of metastatic endometrial carcinoma. 2. Persistent hypodense mass involving the lower pole of the left kidney, likely enhancing as correlated with previous PET-CT. This remains worrisome for renal cell carcinoma. Again, renal  mass protocol MRI abdomen without and with contrast recommended. 3. Stable incidental findings including cholelithiasis, atherosclerosis, distal colonic diverticulosis and lumbar spondylosis.   Electronically Signed By: Lydia Walsh M.D. On: 12/27/2015 11:26  CLINICAL DATA: Right upper  quadrant pain  EXAM: ULTRASOUND ABDOMEN COMPLETE  COMPARISON: None.  FINDINGS: Gallbladder: Well distended with multiple gallstones within. No wall thickening or pericholecystic fluid is noted.  Common bile duct: Diameter: 4.6 mm.  Liver: Increase in echogenicity consistent with fatty infiltration.  IVC: No abnormality visualized.  Pancreas: Visualized portion unremarkable.  Spleen: Size and appearance within normal limits.  Right Kidney: Length: 10.9 cm. Echogenicity within normal limits. No mass or hydronephrosis visualized.  Left Kidney: Length: 11.1 cm. Echogenicity within normal limits. No mass or hydronephrosis visualized.  Abdominal aorta: No aneurysm visualized.  Other findings: None.  IMPRESSION: Cholelithiasis without complicating factors.  Fatty liver.   Electronically Signed By: Lydia Walsh M.D. On: 03/31/2014 08:14   LFT's WNL on 07/05/17   Problem List/Past Medical Lydia Key K. Calani Gick, MD; 07/30/2017 11:48 AM) CHRONIC CHOLECYSTITIS WITH CALCULUS (K80.10)  Past Surgical History (Lydia Tooker K. Cosandra Plouffe, MD; 07/30/2017 11:48 AM) Hysterectomy (due to cancer) - Complete  Diagnostic Studies History (Lydia Walsh K. Sheera Illingworth, MD; 07/30/2017 11:48 AM) Mammogram within last year  Allergies (Lydia A. Owens Shark, [place]; 07/30/2017 9:36 AM) HydroCHLOROthiazide *DIURETICS* Allergies Reconciled  Medication History (Lydia A. Owens Shark, RMA; 07/30/2017 9:36 AM) Allopurinol (100MG  Tablet, Oral) Active. AmLODIPine Besylate (10MG  Tablet, Oral) Active. Ferrous Fumarate (324 (106 Fe)MG Tablet, Oral) Active. Levothyroxine Sodium (125MCG Tablet, Oral) Active. Omeprazole (20MG  Capsule  DR, Oral) Active. Potassium Chloride (10mEq) (0.2MEQ/ML Solution, Injection) Active. Simvastatin (20MG  Tablet, Oral) Active. Spironolactone (50MG  Tablet, Oral) Active. Medications Reconciled  Social History Lydia Walsh. Lydia Kahrs, MD; 07/30/2017 11:48 AM) No alcohol use No caffeine use Tobacco use Former smoker.  Family History Lydia Walsh. Lydia Bogusz, MD; 07/30/2017 11:48 AM) Alcohol Abuse Father. Arthritis Mother. Heart Disease Mother. Hypertension Mother.  Pregnancy / Birth History Lydia Walsh. Lydia Baby, MD; 07/30/2017 11:48 AM) Age at menarche 72 years. Age of menopause 24-60 Gravida 3 Maternal age 30-25 Para 59  Other Problems Lydia Walsh. Lydia Donalson, MD; 07/30/2017 11:48 AM) Arthritis Cholelithiasis Heart murmur High blood pressure Hypercholesterolemia Sleep Apnea Thyroid Disease    Vitals (Lydia Walsh RMA; 07/30/2017 9:36 AM) 07/30/2017 9:36 AM Weight: 185.2 lb Height: 60in Body Surface Area: 1.81 m Body Mass Index: 36.17 kg/m  Temp.: 97.57F  Pulse: 84 (Regular)  BP: 134/76 (Sitting, Left Arm, Standard)      Physical Exam Lydia Key K. Jeri Jeanbaptiste MD; 07/30/2017 11:51 AM)  The physical exam findings are as follows: Note:WDWN in NAD Eyes: Pupils equal, round; sclera anicteric HENT: Oral mucosa moist; good dentition Neck: No masses palpated, no thyromegaly Lungs: CTA bilaterally; normal respiratory effort CV: Regular rate and rhythm; no murmurs; extremities well-perfused with no edema Abd: +bowel sounds, soft, mildly tender in epigastrium and RUQ, no palpable organomegaly; no palpable hernias; healed laparoscopic incisions Skin: Warm, dry; no sign of jaundice Psychiatric - alert and oriented x 4; calm mood and affect    Assessment & Plan Lydia Key K. Lalena Salas MD; 07/30/2017 10:01 AM)  CHRONIC CHOLECYSTITIS WITH CALCULUS (K80.10)  Current Plans Schedule for Surgery - Laparoscopic cholecystectomy with intraoperative cholangiogram. The surgical  procedure has been discussed with the patient. Potential risks, benefits, alternative treatments, and expected outcomes have been explained. All of the patient's questions at this time have been answered. The likelihood of reaching the patient's treatment goal is good. The patient understand the proposed surgical procedure and wishes to proceed.  Lydia Walsh. Georgette Dover, MD, [facility] Surgery  General/ Trauma Surgery  07/30/2017 11:51 AM

## 2017-08-05 ENCOUNTER — Encounter (INDEPENDENT_AMBULATORY_CARE_PROVIDER_SITE_OTHER): Payer: Self-pay | Admitting: Orthopaedic Surgery

## 2017-08-05 ENCOUNTER — Ambulatory Visit (INDEPENDENT_AMBULATORY_CARE_PROVIDER_SITE_OTHER): Payer: BLUE CROSS/BLUE SHIELD | Admitting: Physician Assistant

## 2017-08-05 DIAGNOSIS — G8929 Other chronic pain: Secondary | ICD-10-CM

## 2017-08-05 DIAGNOSIS — M25562 Pain in left knee: Secondary | ICD-10-CM | POA: Diagnosis not present

## 2017-08-05 MED ORDER — TRAMADOL HCL 50 MG PO TABS: ORAL_TABLET | ORAL | 0 refills | Status: DC

## 2017-08-05 NOTE — Progress Notes (Signed)
Office Visit Note   Patient: Lydia Walsh           Date of Birth: 03/05/1955           MRN: [id number] Visit Date: 08/05/2017              Requested by: Asencion Noble, MD [address], [place] [address] PCP: Asencion Noble, MD   Assessment & Plan: Visit Diagnoses:  1. Chronic pain of left knee     Plan: Impression is left knee Pes bursitis with underlying severe osteoarthritis.  I have discussed with patient the likelihood that the antalgic gait from the underlying arthritis has inflamed her pes bursa and that until you cure the underlying issue which is arthritis in the antalgic gait, the Pez bursitis will continue to occur.  We have discussed injecting the bursa today, but the patient would like to avoid this for right now. We have also discussed the need for total knee replacement.  She will follow up with [place] as needed.    Follow-Up Instructions: Return if symptoms worsen or fail to improve.   Orders:  No orders of the defined types were placed in this encounter.  Meds ordered this encounter  Medications  . traMADol (ULTRAM) 50 MG tablet    Sig: TAKE 1-2 TABS PO Q6-8 HOURS PRN PAIN    Dispense:  30 tablet    Refill:  0      Procedures: No procedures performed   Clinical Data: No additional findings.   Subjective: Chief Complaint  Patient presents with  . Left Knee - Pain    HPI patient is a pleasant 62 year old female who presents to our clinic today with recurrent left knee pain.  History of osteoarthritis over the past 2 years.  The pain she has is to the entire knee but worse over the pes bursa.  She has associated swelling around the pes bursa as well.  No new injury or change in activity.  Pain is worse with weightbearing.  She has tried ibuprofen without relief of symptoms.  She does note a previous cortisone injection to the knee joint which only helped for a few days.  Review of Systems as detailed in HPI.  All others reviewed and are  negative.   Objective: Vital Signs: LMP 07/16/2014   Physical Exam well-developed and well-nourished female in no acute distress.  Alert and oriented x3.  Ortho Exam examination of the left knee shows a trace effusion.  Range of motion 0 to 110 degrees.  Minimal joint line tenderness.  Marked tenderness and swelling over the Pez bursa.  She is stable to valgus varus stress.  Neurovascularly intact distally.  She does walk with an antalgic gait   Specialty Comments:  No specialty comments available.  Imaging: No new imaging   PMFS History: Patient Active Problem List   Diagnosis Date Noted  . Primary osteoarthritis of knees, bilateral 11/18/2016  . AV block, 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  . Viral upper respiratory tract infection 07/06/2016  . Restless leg syndrome 06/01/2016  . Chronic pain of left knee 02/03/2016  . Esophageal dysphagia 09/03/2015  . Port catheter in place 08/20/2015  . Hypokalemia 07/24/2015  . International Federation 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 ([place]) 07/10/2015  . 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[place]) 07/10/2015  . Rash 07/02/2015  . Renal mass, left 06/21/2015  . Nephrolithiasis 06/21/2015  . Obesity (BMI 30-39.9) 06/21/2015  . Iron deficiency anemia  due to chronic blood loss 06/21/2015  . Endometrial cancer ([place]) 03/29/2015  . Arm pain, left 10/29/2013  . Palpitations 08/02/2013  . Major depression 06/02/2013  . Fatigue 05/02/2013  . Unspecified constipation 05/23/2012  . Cough 03/28/2012  . Osteoarthritis   . LOW BACK PAIN, CHRONIC 02/27/2008  . GOUT 11/24/2007  . ALLERGIC RHINITIS, SEASONAL 03/09/2006  . G E R D 02/19/2006  . Hypothyroidism 02/17/2006  . Dyslipidemia 02/17/2006  . Essential hypertension 02/17/2006  . OBSTRUCTIVE SLEEP APNEA 06/21/2005   Past Medical History:  Diagnosis Date  . Anemia   . Cancer [facility])    endometrial cancer  . Endometrial polyp   . GERD (gastroesophageal  reflux disease)   . Heart murmur   . Heart palpitations   . History of blood transfusion   . History of chemotherapy   . History of radiation therapy   . History of uterine fibroid   . Hyperlipidemia   . Hypertension   . Hypothyroidism   . OSA (obstructive sleep apnea)    moderate OSA per study 06-21-2005--  per pt does need cpap anymore never purchased it  . Osteoarthritis   . PMB (postmenopausal bleeding)   . Wears dentures    upper  . Wears glasses     Family History  Problem Relation Age of Onset  . Hypertension Mother   . Cirrhosis Father        alcoholic  . Heart disease Son   . Colon cancer Neg Hx   . Stomach cancer Neg Hx   . Rectal cancer Neg Hx   . Esophageal cancer Neg Hx   . Liver cancer Neg Hx     Past Surgical History:  Procedure Laterality Date  . BREAST EXCISIONAL BIOPSY Right    benign  . COLONOSCOPY    . DILATATION & CURETTAGE/HYSTEROSCOPY WITH MYOSURE N/A 03/07/2015   Procedure: DILATATION & CURETTAGE/HYSTEROSCOPY WITH MYOSURE (POLYP);  Surgeon: Arvella Nigh, MD;  Location: [facility];  Service: Gynecology;  Laterality: N/A;  . DILATATION & CURRETTAGE/HYSTEROSCOPY WITH RESECTOCOPE  12-13-2002  &  09-26-2004   polyp/  submucosal fibroid  . ESOPHAGOGASTRODUODENOSCOPY    . EXCISION BENIGN RIGHT BREAST MASS  age 61  . IR REMOVAL TUN ACCESS W/ PORT W/O FL MOD SED  07/17/2016  . KNEE ARTHROSCOPY W/ MENISCECTOMY Bilateral right 04-12-2006//  left 12-29-2006   and Chondroplasty  . port placement    . ROBOTIC ASSISTED TOTAL HYSTERECTOMY WITH BILATERAL SALPINGO OOPHERECTOMY Bilateral 05/02/2015   Procedure: XI ROBOTIC ASSISTED TOTAL HYSTERECTOMY WITH BILATERAL SALPINGO OOPHORECTOMY AND SENTINAL LYMPH NODE BIOPSY;  Surgeon: Janie Morning, MD;  Location: [facility];  Service: Gynecology;  Laterality: Bilateral;  . TRANSTHORACIC ECHOCARDIOGRAM  04-02-2010   normal LV, ef 65%  . TUBAL LIGATION  1980's   Social History   Occupational History  .  Occupation: Production manager, [employer]  Tobacco Use  . Smoking status: Former Smoker    Years: 2.00    Types: Cigarettes    Last attempt to quit: 02/28/1994    Years since quitting: 23.4  . Smokeless tobacco: Never Used  Substance and Sexual Activity  . Alcohol use: No    Alcohol/week: 0.0 oz  . Drug use: No  . Sexual activity: Never    Birth control/protection: Post-menopausal

## 2017-08-17 ENCOUNTER — Other Ambulatory Visit: Payer: Self-pay | Admitting: Surgery

## 2017-08-17 DIAGNOSIS — K811 Chronic cholecystitis: Secondary | ICD-10-CM | POA: Diagnosis not present

## 2017-08-17 DIAGNOSIS — K8010 Calculus of gallbladder with chronic cholecystitis without obstruction: Secondary | ICD-10-CM | POA: Diagnosis not present

## 2017-08-31 ENCOUNTER — Other Ambulatory Visit: Payer: Self-pay | Admitting: Internal Medicine

## 2017-08-31 DIAGNOSIS — E876 Hypokalemia: Secondary | ICD-10-CM

## 2017-08-31 IMAGING — PT NM PET TUM IMG INITIAL (PI) SKULL BASE T - THIGH
1 of 8 series · 1 of 25 positions shown · non-contrast
Comparison: CT abdomen pelvis 05/24/2015.

CLINICAL DATA: Initial treatment strategy for endometrial cancer.

EXAM:
NUCLEAR MEDICINE PET SKULL BASE TO THIGH
TECHNIQUE: 8.7 mCi F-18 FDG was injected intravenously. Full-ring PET imaging
was performed from the skull base to thigh after the radiotracer. CT
data was obtained and used for attenuation correction and anatomic
localization.
FASTING BLOOD GLUCOSE:  Value: 116 mg/dl

[Series 4: ct sk_thigh 5.0 b31f · axial · 5.0mm · 0.98mm/px · 1 of 188 slices shown]
[im 188/188  brain]
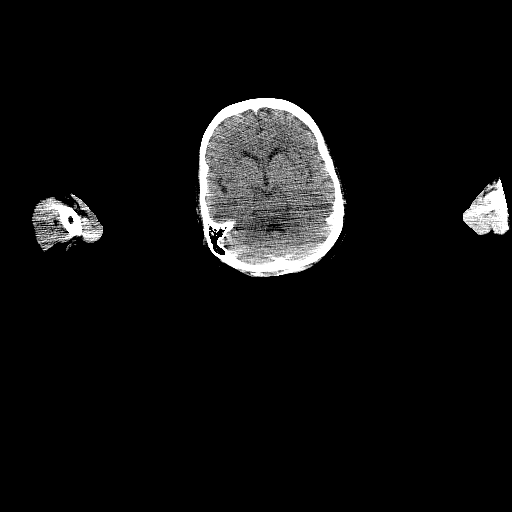

[1 of 25 positions shown; findings below may reference images not displayed]

FINDINGS: NECK

No hypermetabolic lymph nodes in the neck. CT images show no acute
findings.

CHEST

No hypermetabolic mediastinal, hilar or axillary lymph nodes. No
hypermetabolic pulmonary nodules. Heart is at the upper limits of
normal in size to mildly enlarged. Coronary artery calcification. No
pericardial or pleural effusion.

ABDOMEN/PELVIS

10 mm left common iliac lymph node (CT image 122) has an SUV max of
4.1. Other retroperitoneal lymph nodes described on 05/24/2015 are
too small for PET resolution. Focal hypermetabolism associated with
a fatty defect in the lower left lateral abdominal wall musculature
(CT image 114) is nonspecific and may be postprocedural/postsurgical
in etiology. No abnormal hypermetabolism in the liver, adrenal
glands, spleen or pancreas. Liver is grossly unremarkable. Numerous
stones are seen in the gallbladder. Adrenal glands and right kidney
are unremarkable. Ill-defined and minimally hypodense lesion in the
lower pole left kidney, seen and described on 05/24/2015, appears to
be slightly hyperdense on the current study (series 4, image 103),
measuring approximately 1.8 cm. Spleen, pancreas, stomach and bowel
are grossly unremarkable. Hysterectomy. No free fluid.

SKELETON

No abnormal osseous hypermetabolism. Degenerative changes are seen
in the spine.
IMPRESSION: 1. 10 mm left common iliac lymph node is mildly hypermetabolic,
worrisome for metastatic disease. Additional sub cm lymph nodes seen
described on 05/24/2015 may be too small for PET resolution.
2. Focal hypermetabolism along the lower left lateral abdominal
musculature may be postoperative in etiology.
3. Mildly hyperdense lesion in the lower pole left kidney, also seen
and described on 05/24/2015. Please refer to that report for further
details and recommendations.
4. Coronary artery calcification.
5. Cholelithiasis.

## 2017-09-06 ENCOUNTER — Telehealth: Payer: Self-pay | Admitting: *Deleted

## 2017-09-06 ENCOUNTER — Encounter: Payer: Self-pay | Admitting: Radiation Oncology

## 2017-09-06 ENCOUNTER — Other Ambulatory Visit: Payer: Self-pay

## 2017-09-06 ENCOUNTER — Ambulatory Visit
Admission: RE | Admit: 2017-09-06 | Discharge: 2017-09-06 | Disposition: A | Discharge status: Home / Self Care | Payer: BLUE CROSS/BLUE SHIELD | Source: Ambulatory Visit | Attending: Radiation Oncology | Admitting: Radiation Oncology

## 2017-09-06 VITALS — BP 148/72 | HR 70 | Temp 98.3°F | Resp 18 | Wt 179.8 lb

## 2017-09-06 DIAGNOSIS — Z08 Encounter for follow-up examination after completed treatment for malignant neoplasm: Secondary | ICD-10-CM | POA: Diagnosis not present

## 2017-09-06 DIAGNOSIS — C541 Malignant neoplasm of endometrium: Secondary | ICD-10-CM | POA: Diagnosis not present

## 2017-09-06 DIAGNOSIS — Z8542 Personal history of malignant neoplasm of other parts of uterus: Secondary | ICD-10-CM | POA: Diagnosis not present

## 2017-09-06 DIAGNOSIS — G8918 Other acute postprocedural pain: Secondary | ICD-10-CM | POA: Diagnosis not present

## 2017-09-06 DIAGNOSIS — Z79899 Other long term (current) drug therapy: Secondary | ICD-10-CM | POA: Insufficient documentation

## 2017-09-06 DIAGNOSIS — Z9049 Acquired absence of other specified parts of digestive tract: Secondary | ICD-10-CM | POA: Diagnosis not present

## 2017-09-06 NOTE — Progress Notes (Signed)
Radiation Oncology         [phone] ________________________________  Name: Lydia Walsh MRN: [id number]  Date: 09/06/2017  DOB: 04/24/1955    Follow-Up Visit Note  CC: Asencion Noble, MD  Gordy Levan, MD    ICD-10-CM   1. Endometrial cancer ([place]) C54.1     Diagnosis: Stage IIIC-1 (pT1b, pN0) grade 2 endometrioid adenocarcinoma    Interval Since Last Radiation:  1 year 10 months  1) 09/02/15-10/08/15: External beam radiation to the pelvis - 45 Gy in 25 fractions, simultaneous integrated boost to suspicious node 50 Gy  2) 10/15/15, 10/22/15, 10/24/15 - HDR vaginal brachytherapy - 18 Gy in 3 fractions.  Narrative:  The patient returns today for routine follow-up. She notes that she is doing somewhat better following her recent cholecystectomy.   Since her last visit to the office, she underwent a cholecystectomy on 08/17/2017 with results showing: Gallbladder with chronic calculous cholecystitis. No dysplasia or malignancy identified. Fragments of incidental benign liver.   She last saw Dr. Skeet Latch 3 months ago. She has been seen by Medical Oncologist, Dr. Alvy Bimler on 07/05/2017.   On review of systems, she reports intermittent pelvic pain, umbilicus pain s/p recent cholecystectomy. she denies vaginal bleeding, hematuria, rectal bleeding, and any other symptoms. Pertinent positives are listed and detailed within the above HPI.   ALLERGIES:  is allergic to hctz [hydrochlorothiazide].  Meds: Current Outpatient Medications  Medication Sig Dispense Refill  . allopurinol (ZYLOPRIM) 100 MG tablet TAKE 1 TABLET BY MOUTH ONCE DAILY 90 tablet 1  . levothyroxine (SYNTHROID, LEVOTHROID) 125 MCG tablet Take 1 tablet (125 mcg total) by mouth daily. 90 tablet 0  . metoprolol succinate (TOPROL XL) 50 MG 24 hr tablet Take 1 tablet (50 mg total) by mouth daily. Take with or immediately following a meal. 30 tablet 11  . omeprazole (PRILOSEC) 20 MG capsule Take 1 capsule (20 mg  total) by mouth daily. 30 capsule 11  . OVER THE COUNTER MEDICATION Alive Women's 50+ Gummie Multivitamin    . potassium chloride (K-DUR) 10 MEQ tablet TAKE 1 TABLET BY MOUTH ONCE DAILY 90 tablet 3  . simvastatin (ZOCOR) 20 MG tablet Take 1 tablet (20 mg total) by mouth every evening. 30 tablet 11  . spironolactone (ALDACTONE) 25 MG tablet Take 2 tablets (50 mg total) by mouth daily. D/c previous spironolactone prescription 180 tablet 3  . traMADol (ULTRAM) 50 MG tablet TAKE 1-2 TABS PO Q6-8 HOURS PRN PAIN 30 tablet 0  . fluticasone (FLONASE) 50 MCG/ACT nasal spray Spray 1-2 sprays per nostril per day as needed for treatment of allergies. 16 g 5  . ibuprofen (ADVIL,MOTRIN) 200 MG tablet Take 200 mg by mouth as needed.    Marland Kitchen Peppermint Oil (IBGARD PO) Take by mouth daily as needed.     No current facility-administered medications for this encounter.     Physical Findings: The patient is in no acute distress. Patient is alert and oriented.  weight is 179 lb 12.8 oz (81.6 kg). Her oral temperature is 98.3 F (36.8 C). Her blood pressure is 148/72 (abnormal) and her pulse is 70. Her respiration is 18 and oxygen saturation is 96%.   Lungs are clear to auscultation bilaterally. Heart has regular rate and rhythm. No palpable cervical, supraclavicular, or axillary adenopathy. Abdomen soft, non-tender, normal bowel sounds.   On initial exam the vaginal vault was narrowed and shortened. Adhesions were gently released and view of the vaginal cuff was obtained showing no mucosal  lesions at the cuff or other areas of the vaginal wall. On bimanual and rectovaginal examination there were no pelvic masses appreciated. Umbilicus scar is healing well without signs of drainage or infection  Lab Findings: Lab Results  Component Value Date   WBC 4.6 07/05/2017   HGB 12.0 07/05/2017   HCT 36.8 07/05/2017   MCV 89.9 07/05/2017   PLT 204 07/05/2017    Radiographic Findings: No results found.  Impression:   No evidence of recurrence on clinical exam. Patient continue to have soreness since cholecystectomy, but is making good progress as this issue is concerned.   Plan: The patient will follow up with Dr. Skeet Latch in 3 months. She will follow up with radiation oncology in 6 months.   -----------------------------------  Blair Promise, PhD, MD  This document serves as a record of services personally performed by Gery Pray, MD. It was created on his behalf by [facility], a trained medical scribe. The creation of this record is based on the scribe's personal observations and the provider's statements to them. This document has been checked and approved by the attending provider.

## 2017-09-06 NOTE — Telephone Encounter (Signed)
XXXX 

## 2017-09-06 NOTE — Progress Notes (Signed)
Pt presents today for f/u with Dr. Sondra Come. Pt is unaccompanied. Pt is recently post-op for cholecystectomy on 08/17/17 by Dr. Lane Hacker. Pt reports not using vaginal dilator "my stomach was hurting". Pt denies dysuria/hematuria. Pt denies vaginal bleeding/discharge. Pt denies rectal bleeding or constipation. Pt reports "on and off" diarrhea. Pt denies abdominal bloating.   BP (!) 148/72   Pulse 70   Temp 98.3 F (36.8 C) (Oral)   Resp 18   Wt 179 lb 12.8 oz (81.6 kg)   LMP 07/16/2014   SpO2 96%   BMI 37.58 kg/m   Wt Readings from Last 3 Encounters:  09/06/17 179 lb 12.8 oz (81.6 kg)  07/12/17 182 lb 8 oz (82.8 kg)  07/08/17 182 lb 4.8 oz (82.7 kg)   Loma Sousa, RN BSN

## 2017-09-06 NOTE — Telephone Encounter (Signed)
APPT. WITH DR. Skeet Latch CANCELLED FOR 09/09/17 AND RESCHEDULED WITH DR. Skeet Latch ON 11-18-17 INSTEAD OF DR. Denman George, PT. IS AWARE OF THIS APPT. AND THE CANCELLATION

## 2017-09-06 NOTE — Telephone Encounter (Signed)
CALLED PATIENT TO INFORM OF FU APPT. WITH DR. Denman George ON 11-18-17- ARRIVAL TIME- 11:30 AM , PT. AWARE APPT. WITH DR. Denman George CANCELLED ON 09-09-17 PER DR. KINARD DIRECTION, PATIENT AWARE OF THIS APPT. AND THE APPT. CHANGE

## 2017-09-09 ENCOUNTER — Ambulatory Visit: Payer: BLUE CROSS/BLUE SHIELD | Admitting: Gynecologic Oncology

## 2017-09-09 ENCOUNTER — Other Ambulatory Visit: Payer: Self-pay | Admitting: Internal Medicine

## 2017-09-20 ENCOUNTER — Other Ambulatory Visit: Payer: Self-pay | Admitting: Internal Medicine

## 2017-09-20 DIAGNOSIS — E039 Hypothyroidism, unspecified: Secondary | ICD-10-CM

## 2017-09-28 ENCOUNTER — Other Ambulatory Visit: Payer: Self-pay | Admitting: Internal Medicine

## 2017-10-03 IMAGING — US IR FLUORO GUIDE CV LINE*R*
1 series · 1 of 1 positions shown · non-contrast
Comparison: none

INDICATION: 59-year-old female with a history of endometrial carcinoma. She
presents for port catheter placement

[Series 1: ir fluoro/shunt/fist · 1 of 1 slices shown]
[im 1/1]
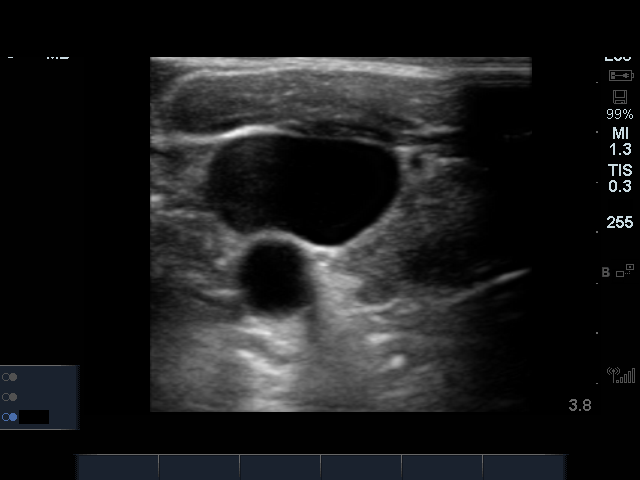

[1 of 1 positions shown; findings below may reference images not displayed]

EXAM:
IMPLANTED PORT A CATH PLACEMENT WITH ULTRASOUND AND FLUOROSCOPIC
GUIDANCE

MEDICATIONS:
2.0 g Ancef; The antibiotic was administered within an appropriate
time interval prior to skin puncture.

ANESTHESIA/SEDATION:
Moderate (conscious) sedation was employed during this procedure. A
total of Versed 4.0 mg and Fentanyl 50 mcg was administered
intravenously.

Moderate Sedation Time: 22 minutes. The patient's level of
consciousness and vital signs were monitored continuously by
radiology nursing throughout the procedure under my direct
supervision.

FLUOROSCOPY TIME:  Zero minutes, 6 seconds (2 mGy)

COMPLICATIONS:
None

PROCEDURE:
The procedure, risks, benefits, and alternatives were explained to
the patient. Questions regarding the procedure were encouraged and
answered. The patient understands and consents to the procedure.

Ultrasound survey was performed with images stored and sent to PACs.

The right neck and chest was prepped with chlorhexidine, and draped
in the usual sterile fashion using maximum barrier technique (cap
and mask, sterile gown, sterile gloves, large sterile sheet, hand
hygiene and cutaneous antiseptic). Antibiotic prophylaxis was
provided with 2.0g Ancef administered IV one hour prior to skin
incision. Local anesthesia was attained by infiltration with 1%
lidocaine without epinephrine.

Ultrasound demonstrated patency of the right internal jugular vein,
and this was documented with an image. Under real-time ultrasound
guidance, this vein was accessed with a 21 gauge micropuncture
needle and image documentation was performed. A small dermatotomy
was made at the access site with an 11 scalpel. A 0.018" wire was
advanced into the SVC and used to estimate the length of the
internal catheter. The access needle exchanged for a 4F
micropuncture vascular sheath. The 0.018" wire was then removed and
a 0.035" wire advanced into the IVC.



The venous access site was then serially dilated and a peel away
vascular sheath placed over the wire. The wire was removed and the
port catheter advanced into position under fluoroscopic guidance.
The catheter tip is positioned in the cavoatrial junction. This was
documented with a spot image. The portacatheter was then tested and
found to flush and aspirate well. The port was flushed with saline
followed by 100 units/mL heparinized saline.

The pocket was then closed in two layers using first subdermal
inverted interrupted absorbable sutures followed by a running
subcuticular suture. The epidermis was then sealed with Dermabond.
The dermatotomy at the venous access site was also seal with
Dermabond.

Patient tolerated the procedure well and remained hemodynamically
stable throughout.

No complications encountered and no significant blood loss
encountered
IMPRESSION: Status post right IJ port catheter placement. Catheter ready for
use.

## 2017-10-15 ENCOUNTER — Ambulatory Visit: Payer: BLUE CROSS/BLUE SHIELD

## 2017-10-18 ENCOUNTER — Other Ambulatory Visit: Payer: Self-pay | Admitting: Internal Medicine

## 2017-10-19 IMAGING — DX DG FOOT COMPLETE 3+V*R*
3 series · 3 of 3 positions shown · non-contrast
Comparison: 02/27/2004

CLINICAL DATA: Initial encounter for Per pt: stumped the right foot
last night on a table leg, injuring the right foot. Pain is right
foot, 4th toe. No prior injury to the right foot. Patient is not a
diabetic.

EXAM:
RIGHT FOOT COMPLETE - 3+ VIEW

[foot ap]
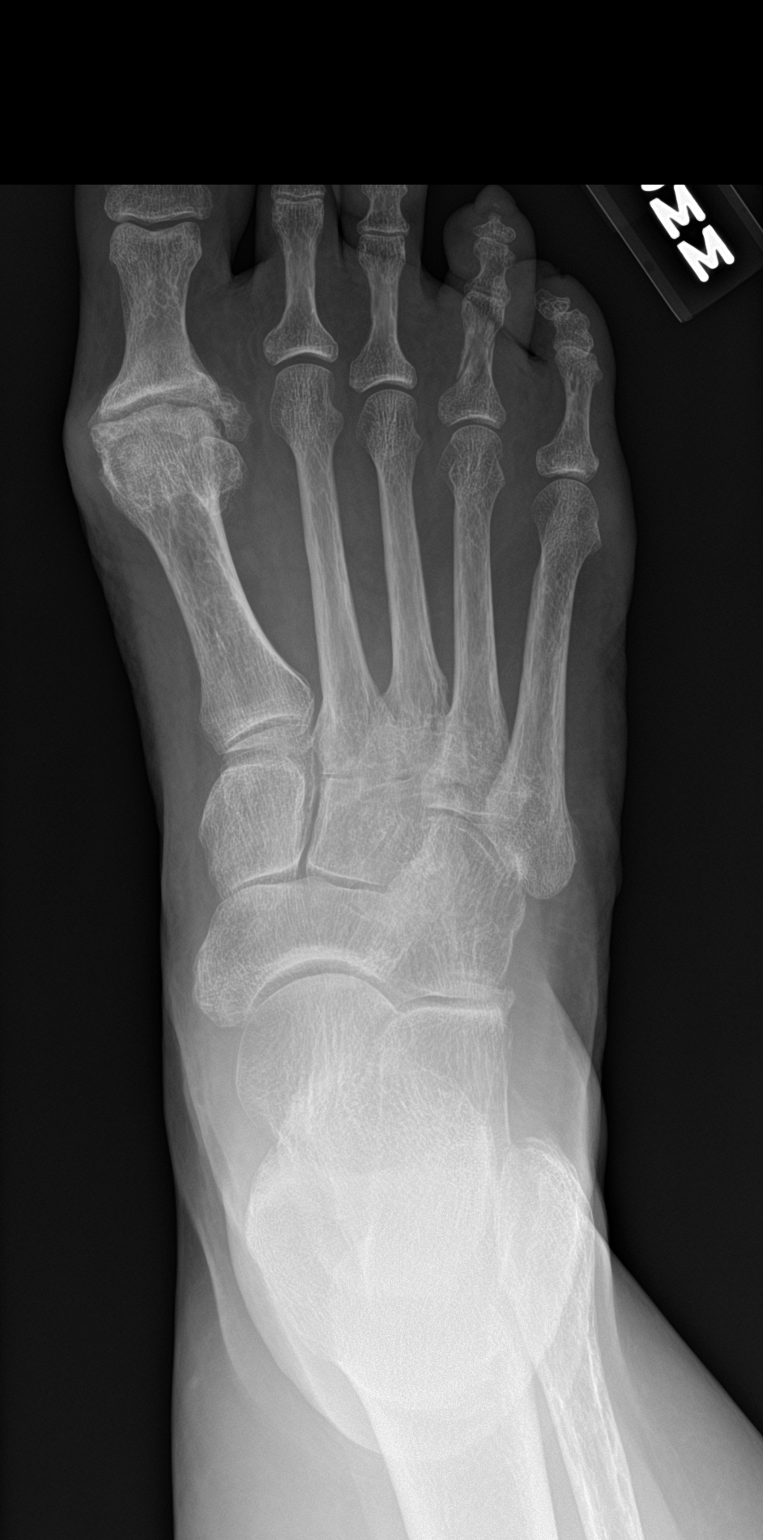

[foot obl]
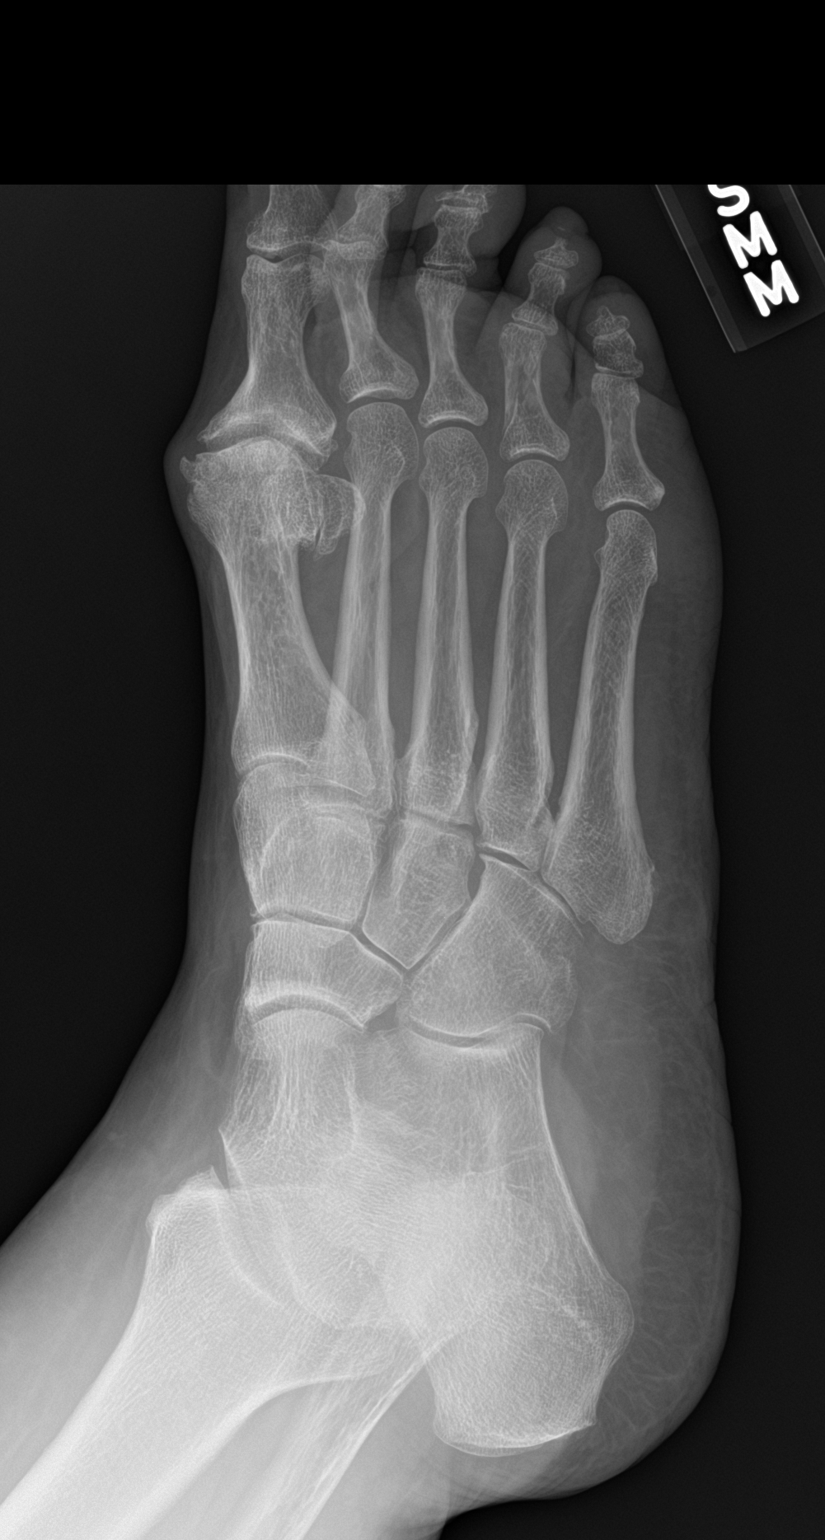

[foot lat]
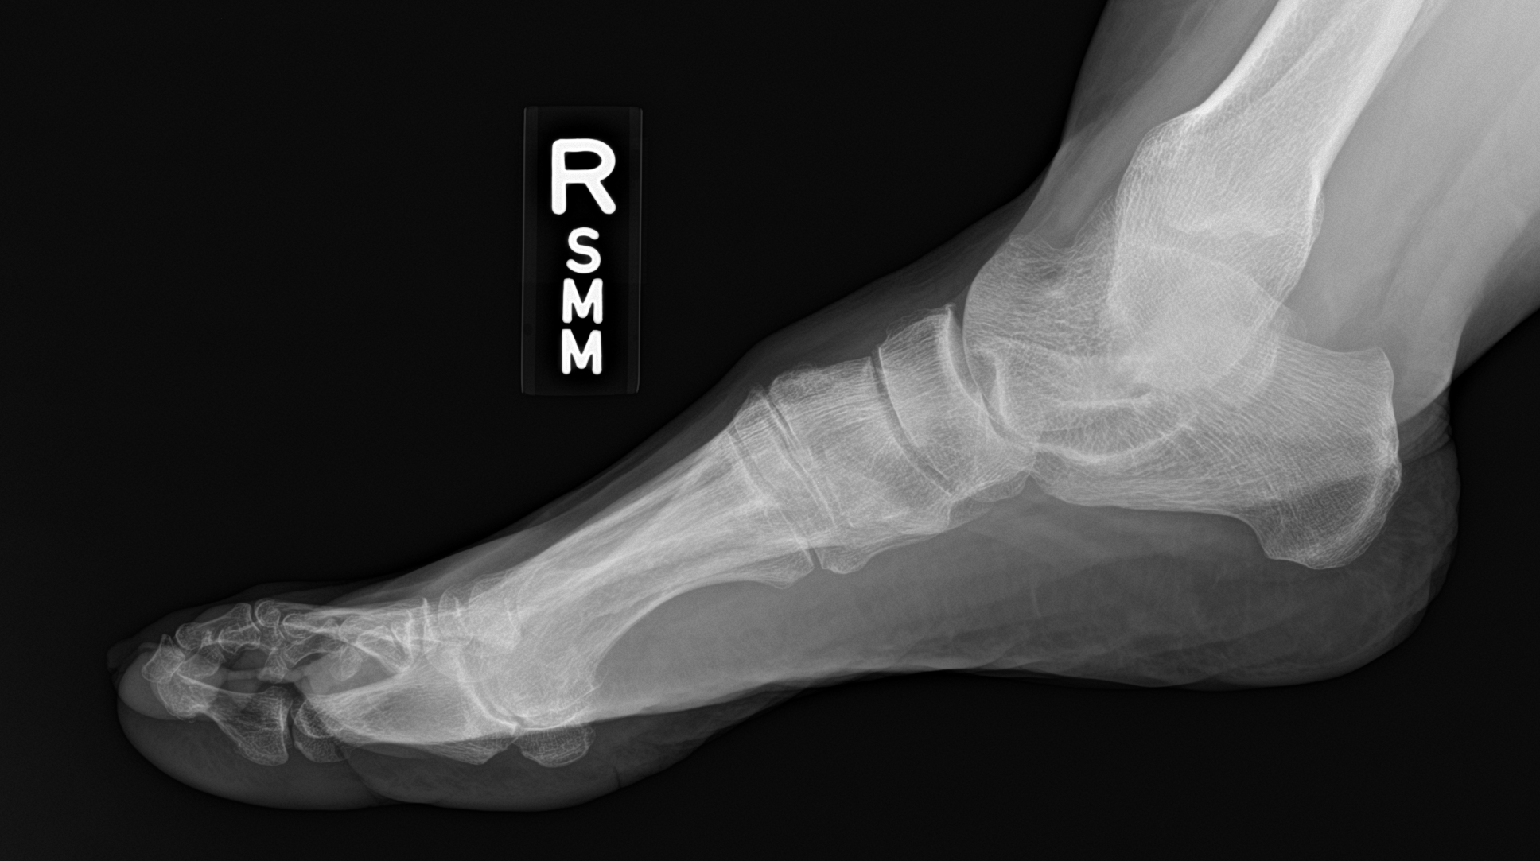

[3 of 3 positions shown; findings below may reference images not displayed]

FINDINGS: Progression of moderate to marked osteoarthritis involving the first
metatarsal phalangeal joint. Minimally displaced, oblique fracture
involving the proximal phalanx of the fourth. No intra-articular
extension.
IMPRESSION: Fourth proximal phalangeal fracture.

Advanced osteoarthritis of the first metatarsal phalangeal joint.

## 2017-11-18 ENCOUNTER — Encounter: Payer: Self-pay | Admitting: Gynecologic Oncology

## 2017-11-18 ENCOUNTER — Inpatient Hospital Stay: Payer: BLUE CROSS/BLUE SHIELD | Attending: Gynecologic Oncology | Admitting: Gynecologic Oncology

## 2017-11-18 VITALS — BP 123/68 | HR 74 | Temp 98.4°F | Resp 18 | Ht <= 58 in | Wt 182.6 lb

## 2017-11-18 DIAGNOSIS — C541 Malignant neoplasm of endometrium: Secondary | ICD-10-CM | POA: Diagnosis not present

## 2017-11-18 DIAGNOSIS — Z90710 Acquired absence of both cervix and uterus: Secondary | ICD-10-CM | POA: Diagnosis not present

## 2017-11-18 DIAGNOSIS — Z90722 Acquired absence of ovaries, bilateral: Secondary | ICD-10-CM | POA: Diagnosis not present

## 2017-11-18 DIAGNOSIS — Z923 Personal history of irradiation: Secondary | ICD-10-CM | POA: Diagnosis not present

## 2017-11-18 DIAGNOSIS — Z9221 Personal history of antineoplastic chemotherapy: Secondary | ICD-10-CM

## 2017-11-18 DIAGNOSIS — Z87891 Personal history of nicotine dependence: Secondary | ICD-10-CM | POA: Diagnosis not present

## 2017-11-18 NOTE — Progress Notes (Signed)
ENDOMETRIAL CANCER FOLLOW-UP VISIT  Assessment:   62 y.o.  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   S/p robotic assisted total hysterectomy, BSO, sentinel lymph node biopsy on 05/02/15 with post-op imaging suggestive of involved lymph nodes. S/p chemoradiation with carboplatin and paclitaxel x 6 cycles and sandwich external beam pelvic radiation (completed 10/24/15) with chemotherapy completed 11/27/15.  NED  Plan: 1) Treatment counseling -discussed that she is disease free on evaluation. Recommend surveillance with exam in 42months.  2)  follow-up: Dr Sondra Come 02/2018 as scheduled and Dr Skeet Latch 05/2018 Per [facility] guidelines for f/u of endometrial cancer,pap tests  are not longer recommended.  HPI:  Lydia Walsh is a 62 y.o. year old G3P3. initially seen in consultation on 03/29/15 referred by Dr Radene Knee for grade 2 endometrial cancer.  She then underwent a robotic assisted total hysterectomy, BSO, sentinel lymph node biopsy with Dr Skeet Latch on 1/76/16 without complications.  Her postoperative course was uncomplicated.  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  Postoperative imaging on 05/24/15 showed mild left comon iliac adenopathy. This region had not been sampled surgically. On PET scan on 06/12/15 there was increased uptake at the left common iliac node consistent with metastastatic disease. A 1.8cm left lower pole renal lesion was also identified.  She was diagnosed with clinical stage IIIC disease and was prescribed sandwiched chemotherapy and radiation. She received 6 cycles of carboplatin and paclitaxel between 06/26/15 and 11/27/15 with whole pelvic external beam radiation administered after 3 cycles from July through 10/24/15.   She tolerated treatment well.   On  post-treatment CT abdo/pelvis on 12/27/15 showed: No residual lymphadenopathy or other evidence of metastatic endometrial carcinoma. 2. Persistent hypodense mass involving the lower pole of the left kidney, likely enhancing as correlated with previous PET-CT. This remains worrisome for renal cell carcinoma.  Followed by urology    Past Medical History:  Diagnosis Date  . Anemia   . Cancer [facility])    endometrial cancer  . Endometrial polyp   . GERD (gastroesophageal reflux disease)   . Heart murmur   . Heart palpitations   . History of blood transfusion   . History of chemotherapy   . History of radiation therapy   . History of uterine fibroid   . Hyperlipidemia   . Hypertension   . Hypothyroidism   . OSA (obstructive sleep apnea)    moderate OSA per study 06-21-2005--  per pt does need cpap anymore never purchased it  . Osteoarthritis   . PMB (postmenopausal bleeding)   . Wears dentures    upper  . Wears glasses    Past Surgical History:  Procedure Laterality Date  . BREAST EXCISIONAL BIOPSY Right    benign  . COLONOSCOPY    . DILATATION & CURETTAGE/HYSTEROSCOPY WITH MYOSURE N/A 03/07/2015   Procedure: DILATATION & CURETTAGE/HYSTEROSCOPY WITH MYOSURE (POLYP);  Surgeon: Arvella Nigh, MD;  Location: [facility];  Service: Gynecology;  Laterality: N/A;  . DILATATION & CURRETTAGE/HYSTEROSCOPY WITH RESECTOCOPE  12-13-2002  &  09-26-2004   polyp/  submucosal fibroid  . ESOPHAGOGASTRODUODENOSCOPY    . EXCISION BENIGN RIGHT BREAST MASS  age 66  . IR REMOVAL TUN ACCESS W/ PORT W/O FL MOD SED  07/17/2016  . KNEE ARTHROSCOPY W/ MENISCECTOMY Bilateral right 04-12-2006//  left 12-29-2006  and Chondroplasty  . port placement    . ROBOTIC ASSISTED TOTAL HYSTERECTOMY WITH BILATERAL SALPINGO OOPHERECTOMY Bilateral 05/02/2015   Procedure: XI ROBOTIC ASSISTED TOTAL HYSTERECTOMY WITH BILATERAL SALPINGO OOPHORECTOMY AND SENTINAL LYMPH NODE BIOPSY;  Surgeon: Janie Morning, MD;   Location: [facility];  Service: Gynecology;  Laterality: Bilateral;  . TRANSTHORACIC ECHOCARDIOGRAM  04-02-2010   normal LV, ef 65%  . TUBAL LIGATION  1980's   Family History  Problem Relation Age of Onset  . Hypertension Mother   . Cirrhosis Father        alcoholic  . Heart disease Son   . Colon cancer Neg Hx   . Stomach cancer Neg Hx   . Rectal cancer Neg Hx   . Esophageal cancer Neg Hx   . Liver cancer Neg Hx    Social History   Socioeconomic History  . Marital status: Widowed    Spouse name: Not on file  . Number of children: 3  . Years of education: Not on file  . Highest education level: Not on file  Occupational History  . Occupation: Production manager, [employer]  Social Needs  . Financial resource strain: Not on file  . Food insecurity:    Worry: Not on file    Inability: Not on file  . Transportation needs:    Medical: Not on file    Non-medical: Not on file  Tobacco Use  . Smoking status: Former Smoker    Years: 2.00    Types: Cigarettes    Last attempt to quit: 02/28/1994    Years since quitting: 23.7  . Smokeless tobacco: Never Used  Substance and Sexual Activity  . Alcohol use: No    Alcohol/week: 0.0 standard drinks  . Drug use: No  . Sexual activity: Never    Birth control/protection: Post-menopausal  Lifestyle  . Physical activity:    Days per week: Not on file    Minutes per session: Not on file  . Stress: Not on file  Relationships  . Social connections:    Talks on phone: Not on file    Gets together: Not on file    Attends religious service: Not on file    Active member of club or organization: Not on file    Attends meetings of clubs or organizations: Not on file    Relationship status: Not on file  . Intimate partner violence:    Fear of current or ex partner: Not on file    Emotionally abused: Not on file    Physically abused: Not on file    Forced sexual activity: Not on file  Other Topics Concern  . Not on file  Social  History Narrative   Widowed she has 3 sons one died from heart issues.   She is employed with security at [employer]   No alcohol tobacco or drug use   Allergies  Allergen Reactions  . Hctz [Hydrochlorothiazide] Rash   Current Outpatient Medications on File Prior to Visit  Medication Sig Dispense Refill  . allopurinol (ZYLOPRIM) 100 MG tablet TAKE 1 TABLET BY MOUTH ONCE DAILY 90 tablet 1  . fluticasone (FLONASE) 50 MCG/ACT nasal spray Spray 1-2 sprays per nostril per day as needed for treatment of allergies. 16 g 5  . ibuprofen (ADVIL,MOTRIN) 200 MG tablet Take 200 mg by mouth as needed.    Marland Kitchen levothyroxine (SYNTHROID, LEVOTHROID) 125 MCG tablet TAKE 1 TABLET BY MOUTH ONCE DAILY 90 tablet 0  . metoprolol succinate (TOPROL-XL) 50 MG  24 hr tablet TAKE 1 TABLET BY MOUTH ONCE DAILY IMMEDIATELY FOLLOWING A MEAL 30 tablet 11  . omeprazole (PRILOSEC) 20 MG capsule TAKE 1 CAPSULE BY MOUTH ONCE DAILY 30 capsule 11  . OVER THE COUNTER MEDICATION Alive Women's 50+ Gummie Multivitamin    . Peppermint Oil (IBGARD PO) Take by mouth daily as needed.    . potassium chloride (K-DUR) 10 MEQ tablet TAKE 1 TABLET BY MOUTH ONCE DAILY 90 tablet 3  . simvastatin (ZOCOR) 20 MG tablet TAKE 1 TABLET BY MOUTH EVERY DAY AT BEDTIME 30 tablet 11  . spironolactone (ALDACTONE) 25 MG tablet Take 2 tablets (50 mg total) by mouth daily. D/c previous spironolactone prescription 180 tablet 3  . traMADol (ULTRAM) 50 MG tablet TAKE 1-2 TABS PO Q6-8 HOURS PRN PAIN 30 tablet 0  . [DISCONTINUED] esomeprazole (NEXIUM) 20 MG capsule Take 2 capsules (40 mg total) by mouth daily before breakfast. 30 capsule 3   No current facility-administered medications on file prior to visit.      Review of systems: Constitutional:  She has no weight gain or weight loss. She has no fever or chills. Eyes: No blurred vision Ears, Nose, Mouth, Throat: No dizziness, headaches or changes in hearing. No mouth sores. Cardiovascular: No chest  pain, palpitations or edema. Respiratory:  No shortness of breath, wheezing or cough Gastrointestinal: She has normal bowel movements. She denies any nausea or vomiting. She denies blood in her stool or heart burn.- intermittent episodes of diarrhea since cholecystectomy Genitourinary:  She denies pelvic pain, pelvic pressure or changes in her urinary function. She has no hematuria, dysuria, or incontinence. She has no  vaginal bleeding or vaginal discharge Musculoskeletal: Denies muscle weakness or joint pains.  Skin:  She has no skin changes, rashes or itching Neurological:  Denies dizziness or headaches. No neuropathy, no numbness or tingling. Psychiatric:  She denies depression or anxiety. Hematologic/Lymphatic:   No easy bruising or bleeding   Physical Exam: Blood pressure 123/68, pulse 74, temperature 98.4 F (36.9 C), temperature source Oral, resp. rate 18, height 4\' 10"  (1.473 m), weight 182 lb 9 oz (82.8 kg), last menstrual period 07/16/2014, SpO2 100 %. LMP 07/16/2014  Wt Readings from Last 3 Encounters:  11/18/17 182 lb 9 oz (82.8 kg)  09/06/17 179 lb 12.8 oz (81.6 kg)  07/12/17 182 lb 8 oz (82.8 kg)   General: Well dressed, well nourished in no apparent distress.   HEENT:  Normocephalic and atraumatic, no lesions.  Sclerae anicteric. Lungs:  Clear to auscultation bilaterally.  No wheezes. Cardiovascular:  Regular rate and rhythm.   Abdomen:  Soft, nontender, nondistended.  No palpable masses.  No hepatosplenomegaly.  No ascites. Normal bowel sounds.  No hernias.  Incisions are well healed.  No masses or hernia or tenderness at port sites Genitourinary: Normal EGBUS  Vaginal cuff intact.  Vagina is markedly foreshortened no bleeding or discharge.  No cul de sac fullness,  no nodularity proximal to agglutination.  Atrophic,  Rectal: Good tone no masses no cul-de-sac nodularity  Extremities: No cyanosis, clubbing or edema.  No calf tenderness or erythema.  Psychiatric: Mood and  affect are appropriate. Neurological: Awake, alert and oriented x 3. Musculoskeletal: No pain, normal strength and gait

## 2017-11-18 NOTE — Patient Instructions (Addendum)
  Follow up with Dr Skeet Latch in May 2020. Call our office for any concerns prior to your appointment.  Thank you very much [name] for allowing me to provide care for you today.  I appreciate your confidence in choosing our Gynecologic Oncology team.  If you have any questions about your visit today please call our office and we will get back to you as soon as possible.  Please consider using the website Medlineplus.gov as an Geneticist, molecular.   Francetta Found. Neel Buffone MD., PhD Gynecologic Oncology

## 2017-11-24 ENCOUNTER — Other Ambulatory Visit: Payer: Self-pay | Admitting: *Deleted

## 2017-11-24 DIAGNOSIS — I10 Essential (primary) hypertension: Secondary | ICD-10-CM

## 2017-11-24 MED ORDER — SPIRONOLACTONE 25 MG PO TABS: 50.0000 mg | ORAL_TABLET | Freq: Every day | ORAL | 3 refills | Status: DC

## 2017-11-24 NOTE — Telephone Encounter (Signed)
Last OV 07/08/17  Next appt 11/18 w/pcp

## 2017-11-29 ENCOUNTER — Other Ambulatory Visit: Payer: Self-pay

## 2017-11-29 ENCOUNTER — Ambulatory Visit (INDEPENDENT_AMBULATORY_CARE_PROVIDER_SITE_OTHER): Payer: BLUE CROSS/BLUE SHIELD | Admitting: Internal Medicine

## 2017-11-29 ENCOUNTER — Encounter: Payer: Self-pay | Admitting: Internal Medicine

## 2017-11-29 ENCOUNTER — Ambulatory Visit (HOSPITAL_COMMUNITY)
Admission: RE | Admit: 2017-11-29 | Discharge: 2017-11-29 | Disposition: A | Discharge status: Home / Self Care | Payer: BLUE CROSS/BLUE SHIELD | Source: Ambulatory Visit | Attending: Internal Medicine | Admitting: Internal Medicine

## 2017-11-29 VITALS — BP 136/68 | HR 66 | Temp 98.1°F | Ht 60.0 in | Wt 187.3 lb

## 2017-11-29 DIAGNOSIS — I10 Essential (primary) hypertension: Secondary | ICD-10-CM | POA: Insufficient documentation

## 2017-11-29 DIAGNOSIS — I440 Atrioventricular block, first degree: Secondary | ICD-10-CM | POA: Diagnosis not present

## 2017-11-29 DIAGNOSIS — M170 Bilateral primary osteoarthritis of knee: Secondary | ICD-10-CM

## 2017-11-29 DIAGNOSIS — Z87891 Personal history of nicotine dependence: Secondary | ICD-10-CM

## 2017-11-29 DIAGNOSIS — Z79899 Other long term (current) drug therapy: Secondary | ICD-10-CM

## 2017-11-29 DIAGNOSIS — E039 Hypothyroidism, unspecified: Secondary | ICD-10-CM

## 2017-11-29 DIAGNOSIS — Z79890 Hormone replacement therapy (postmenopausal): Secondary | ICD-10-CM

## 2017-11-29 MED ORDER — DICLOFENAC SODIUM 1 % TD GEL: 2.0000 g | Freq: Four times a day (QID) | TRANSDERMAL | 1 refills | Status: DC

## 2017-11-29 NOTE — Progress Notes (Signed)
CC: Follow up of chronic medical conditions  HPI:  [name] is a 62 y.o.  with a PMH listed below presenting for follow up of chronic medical conditions   Hypertension: Patient is currently taking metoprolol 50 mg daily and spironolactone 25 mg daily.  Her blood pressure today is 149/63, repeat is 136/68. She denies any issues obtaining or taking her medications. Denies any light headedness, dizziness, or palpitations. Blood pressure at goal.   Plan: -Continue metoprolol 50 mg daily -Continue spironolactone 25 mg daily  Hypothyroidism: She is currently on Synthroid 125 mcg daily.  TSH on 07/05/2017 was 8.32. She reports taht she has had some weight gain of about 20 lbs after her gall bladder surgery. She reports that she does stay cold at times. Denies any hair loss and reports that she is feeling less tired.   Plan: -Continue Synthroid 125 mcg daily -Recheck TSH, will likely increase to 150 mcg daily if TSH is still elevated   Chronic knee pain: She has a left knee with severe osteoarthritis.  She has seen orthopedics and they discussed steroid injections but she was not interested at that time.  She is currently on tramadol 50 mg 1 to 2 tablets as needed.  She reports that she is taking them however that they do not help with the pain so she has stopped. She reports that she is still having some knee pain, worse with standing and movement. She has never tried any topical therapies. We discussed streoid injections and she reported that she would like to try the gel first to see it that helps.   Plan: -Discontinue tramadol  Voltaren gel -Can discuss steroid injections if symptoms do not improve  Please see A&P for status of the patient's chronic medical conditions  Past Medical History:  Diagnosis Date  . Anemia   . Cancer [facility])    endometrial cancer  . Endometrial polyp   . GERD (gastroesophageal reflux disease)   . Heart murmur   . Heart palpitations   . History of  blood transfusion   . History of chemotherapy   . History of radiation therapy   . History of uterine fibroid   . Hyperlipidemia   . Hypertension   . Hypothyroidism   . OSA (obstructive sleep apnea)    moderate OSA per study 06-21-2005--  per pt does need cpap anymore never purchased it  . Osteoarthritis   . PMB (postmenopausal bleeding)   . Wears dentures    upper  . Wears glasses    Review of Systems: Refer to history of present illness and assessment and plans for pertinent review of systems, all others reviewed and negative.  Physical Exam:  Vitals:   11/29/17 1350 11/29/17 1506  BP: (!) 149/63 136/68  Pulse: 68 66  Temp: 98.1 F (36.7 C)   SpO2: 100% 100%  Weight: 187 lb 4.8 oz (85 kg)   Height: 5' (1.524 m)     Physical Exam  Constitutional: She is oriented to person, place, and time and well-developed, well-nourished, and in no distress.  HENT:  Head: Normocephalic and atraumatic.  Eyes: Pupils are equal, round, and reactive to light. Conjunctivae and EOM are normal.  Neck: Normal range of motion. Neck supple. No thyromegaly present.  Cardiovascular: Normal rate, regular rhythm and normal heart sounds.  Pulmonary/Chest: Breath sounds normal. No respiratory distress.  Abdominal: Soft. Bowel sounds are normal. She exhibits no distension.  Musculoskeletal: Normal range of motion. She exhibits tenderness (TTP  of left knee, no edema or erythema).  Neurological: She is alert and oriented to person, place, and time. Gait normal.  Skin: Skin is warm and dry. No erythema.  Psychiatric: Mood and affect normal.    Social History   Socioeconomic History  . Marital status: Widowed    Spouse name: Not on file  . Number of children: 3  . Years of education: Not on file  . Highest education level: Not on file  Occupational History  . Occupation: Production manager, [employer]  Social Needs  . Financial resource strain: Not on file  . Food insecurity:    Worry: Not  on file    Inability: Not on file  . Transportation needs:    Medical: Not on file    Non-medical: Not on file  Tobacco Use  . Smoking status: Former Smoker    Years: 2.00    Types: Cigarettes    Last attempt to quit: 02/28/1994    Years since quitting: 23.7  . Smokeless tobacco: Never Used  Substance and Sexual Activity  . Alcohol use: No    Alcohol/week: 0.0 standard drinks  . Drug use: No  . Sexual activity: Never    Birth control/protection: Post-menopausal  Lifestyle  . Physical activity:    Days per week: Not on file    Minutes per session: Not on file  . Stress: Not on file  Relationships  . Social connections:    Talks on phone: Not on file    Gets together: Not on file    Attends religious service: Not on file    Active member of club or organization: Not on file    Attends meetings of clubs or organizations: Not on file    Relationship status: Not on file  . Intimate partner violence:    Fear of current or ex partner: Not on file    Emotionally abused: Not on file    Physically abused: Not on file    Forced sexual activity: Not on file  Other Topics Concern  . Not on file  Social History Narrative   Widowed she has 3 sons one died from heart issues.   She is employed with security at [employer]   No alcohol tobacco or drug use    Family History  Problem Relation Age of Onset  . Hypertension Mother   . Cirrhosis Father        alcoholic  . Heart disease Son   . Colon cancer Neg Hx   . Stomach cancer Neg Hx   . Rectal cancer Neg Hx   . Esophageal cancer Neg Hx   . Liver cancer Neg Hx     Assessment & Plan:   See Encounters Tab for problem based charting.  Patient seen with Dr. Daryll Drown

## 2017-11-29 NOTE — Patient Instructions (Addendum)
Thank you for allowing [place] to provide your care today. Today we discussed your knee pain, blood pressure and thyroid condition    I have ordered some labs for you. I will call if any are abnormal.    Today we made some changes to your medications.   You can start using voltaren gel on your knee, we will see how this works for you. We may consider a steroid injection if your pain persists.  Please continue your blood pressure medications, we will call you with the results of your EKG and blood tests if any changes need to be made  Please follow-up in 2 months.    Should you have any questions or concerns please call the internal medicine clinic at [phone].

## 2017-11-30 ENCOUNTER — Other Ambulatory Visit: Payer: Self-pay | Admitting: Internal Medicine

## 2017-11-30 ENCOUNTER — Telehealth: Payer: Self-pay

## 2017-11-30 DIAGNOSIS — M170 Bilateral primary osteoarthritis of knee: Secondary | ICD-10-CM

## 2017-11-30 LAB — BMP8+ANION GAP
ANION GAP: 23 mmol/L — AB (ref 10.0–18.0)
BUN/Creatinine Ratio: 17 (ref 12–28)
BUN: 14 mg/dL (ref 8–27)
CO2: 17 mmol/L — AB (ref 20–29)
CREATININE: 0.83 mg/dL (ref 0.57–1.00)
Calcium: 10.1 mg/dL (ref 8.7–10.3)
Chloride: 104 mmol/L (ref 96–106)
GFR calc Af Amer: 86 mL/min/{1.73_m2} (ref >59)
GFR calc non Af Amer: 75 mL/min/{1.73_m2} (ref >59)
Glucose: 96 mg/dL (ref 65–99)
Potassium: 3.9 mmol/L (ref 3.5–5.2)
Sodium: 144 mmol/L (ref 134–144)

## 2017-11-30 LAB — TSH: TSH: 0.673 u[IU]/mL (ref 0.450–4.500)

## 2017-11-30 NOTE — Telephone Encounter (Signed)
Rx receipt confirmed by pharmacy yesterday at 2:58 PM. Placed call to [place] at [facility]. States the Rx is there but needs PA. He will fax [place] PA request. Patient made aware that this process can take several days. Hubbard Hartshorn, RN, BSN

## 2017-11-30 NOTE — Assessment & Plan Note (Signed)
Hypothyroidism: She is currently on Synthroid 125 mcg daily.  TSH on 07/05/2017 was 8.32. She reports taht she has had some weight gain of about 20 lbs after her gall bladder surgery. She reports that she does stay cold at times. Denies any hair loss and reports that she is feeling less tired.   Plan: -Continue Synthroid 125 mcg daily -Recheck TSH, will likely increase to 150 mcg daily if TSH is still elevated

## 2017-11-30 NOTE — Assessment & Plan Note (Signed)
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 She denies any light headedness, dizziness, chest pain, or palpapitations. She is currently on metoprolol. Will obtain EKG to reassess.   Repeat EKG showed stable 1st degree heart block. HR 67, PR interval 208. Will continue current medication for now.

## 2017-11-30 NOTE — Assessment & Plan Note (Signed)
Hypertension: Patient is currently taking metoprolol 50 mg daily and spironolactone 25 mg daily.  Her blood pressure today is 149/63, repeat is 136/68. She denies any issues obtaining or taking her medications. Denies any light headedness, dizziness, or palpitations. Blood pressure at goal.   Plan: -Continue metoprolol 50 mg daily -Continue spironolactone 25 mg daily

## 2017-11-30 NOTE — Telephone Encounter (Signed)
Pt states the cream for her knee is not at the pharmacy. Pt is using  [facility] ([facility]), [address] [phone] (Phone) [phone] (Fax)   Please call pt back.

## 2017-11-30 NOTE — Assessment & Plan Note (Signed)
Chronic knee pain: She has a left knee with severe osteoarthritis.  She has seen orthopedics and they discussed steroid injections but she was not interested at that time.  She is currently on tramadol 50 mg 1 to 2 tablets as needed.  She reports that she is taking them however that they do not help with the pain so she has stopped. She reports that she is still having some knee pain, worse with standing and movement. She has never tried any topical therapies. We discussed streoid injections and she reported that she would like to try the gel first to see it that helps.   Plan: -Discontinue tramadol  -Voltaren gel -Can discuss steroid injections if symptoms do not improve

## 2017-11-30 NOTE — Telephone Encounter (Signed)
The pharmacy is requesting new directions, and needs dx code. See instructions on surescript. Thanks

## 2017-12-01 ENCOUNTER — Telehealth: Payer: Self-pay | Admitting: *Deleted

## 2017-12-01 NOTE — Progress Notes (Signed)
Internal Medicine Clinic Attending  I saw and evaluated the patient.  I personally confirmed the key portions of the history and exam documented by Dr. Krienke and I reviewed pertinent patient test results.  The assessment, diagnosis, and plan were formulated together and I agree with the documentation in the resident's note.    

## 2017-12-01 NOTE — Telephone Encounter (Signed)
Information was sent through CoverMyMeds for [place] for Diclofenac Gel. 1% .  Approved 12/01/2017 thru 11/29/2020. [facility] was notified at [phone] spoke with [place].  Sander Nephew, RN 11/30/2017 9:41 AM.

## 2017-12-03 ENCOUNTER — Telehealth: Payer: Self-pay

## 2017-12-03 NOTE — Telephone Encounter (Signed)
Questions about metoprolol succinate (TOPROL-XL) 50 MG 24 hr tablet, please call back.

## 2017-12-03 NOTE — Telephone Encounter (Signed)
Pt states she couldn't tell if her her metoprolol or reflux was causing her discomfort.  States she took her metoprolol on an empty stomach last night and went to bed, but shortly thereafter she had to raise back up because she couldn't "catch her breath" states it's the same feeling she had in the past when her reflux was "acting up". Denies chest pain,  sweating, nausea, vomiting, dizziness, or pain.   Pt encouraged to take metoprolol immediately following a meal  (as stated on directions) and continue to take omeprazole. Pt given [facility] holiday scheudule and instructed to call back on Monday to make an appt if her symptoms dont improve over the weekend or go to ED/Urgent Care if her symptoms warrant.Regenia Skeeter, Dejaun Vidrio Cassady11/22/201911:06 AM

## 2017-12-29 ENCOUNTER — Other Ambulatory Visit: Payer: Self-pay | Admitting: Oncology

## 2017-12-29 DIAGNOSIS — E039 Hypothyroidism, unspecified: Secondary | ICD-10-CM

## 2018-01-12 HISTORY — PX: CHOLECYSTECTOMY, LAPAROSCOPIC: SHX56

## 2018-02-06 ENCOUNTER — Ambulatory Visit (HOSPITAL_COMMUNITY)
Admission: EM | Admit: 2018-02-06 | Discharge: 2018-02-06 | Disposition: A | Discharge status: Home / Self Care | Payer: BLUE CROSS/BLUE SHIELD

## 2018-02-06 ENCOUNTER — Encounter (HOSPITAL_COMMUNITY): Payer: Self-pay | Admitting: *Deleted

## 2018-02-06 DIAGNOSIS — M7551 Bursitis of right shoulder: Secondary | ICD-10-CM | POA: Insufficient documentation

## 2018-02-06 DIAGNOSIS — I10 Essential (primary) hypertension: Secondary | ICD-10-CM | POA: Diagnosis not present

## 2018-02-06 MED ORDER — PREDNISONE 20 MG PO TABS: ORAL_TABLET | ORAL | 0 refills | Status: AC

## 2018-02-06 NOTE — ED Provider Notes (Signed)
02/06/2018 11:33 AM   DOB: 10-28-1955 / MRN: [id number]  SUBJECTIVE:  Lydia Walsh is a 63 y.o. female presenting for right shoulder pain that started about a week ago.  Patient reports started after carrying a very heavy purse.  She since has stopped caring that purse.  Ports pain with any motion of the arm.  Denies a history of diabetes.  She complains of leg swelling however this is not new and is willing to check in with her primary care provider regarding this.  She denies chest pain shortness of breath.   She is allergic to hctz [hydrochlorothiazide].   She  has a past medical history of Anemia, Cancer ([place]), Endometrial polyp, GERD (gastroesophageal reflux disease), Heart murmur, Heart palpitations, History of blood transfusion, History of chemotherapy, History of radiation therapy, History of uterine fibroid, Hyperlipidemia, Hypertension, Hypothyroidism, OSA (obstructive sleep apnea), Osteoarthritis, PMB (postmenopausal bleeding), Wears dentures, and Wears glasses.    She  reports that she quit smoking about 23 years ago. Her smoking use included cigarettes. She quit after 2.00 years of use. She has never used smokeless tobacco. She reports that she does not drink alcohol or use drugs. She  has no history on file for sexual activity. The patient  has a past surgical history that includes Knee arthroscopy w/ meniscectomy (Bilateral, right 04-12-2006//  left 12-29-2006); EXCISION BENIGN RIGHT BREAST MASS (age 42); Dilatation & currettage/hysteroscopy with resectoscope (12-13-2002  &  09-26-2004); transthoracic echocardiogram (04-02-2010); Tubal ligation (1980's); Dilatation & curettage/hysteroscopy with myosure (N/A, 03/07/2015); Robotic assisted total hysterectomy with bilateral salpingo oophorectomy (Bilateral, 05/02/2015); Esophagogastroduodenoscopy; Colonoscopy; port placement; IR REMOVAL TUN ACCESS W/ PORT W/O FL MOD SED (07/17/2016); Breast excisional biopsy (Right); and Abdominal hysterectomy.   Her family history includes Cirrhosis in her father; Heart disease in her son; Hypertension in her mother.  Review of Systems  Musculoskeletal: Positive for joint pain and myalgias.    OBJECTIVE:  BP (!) 149/69   Pulse 73   Temp 98.7 F (37.1 C) (Temporal)   Resp 16   LMP 07/16/2014   SpO2 98%   Wt Readings from Last 3 Encounters:  11/29/17 187 lb 4.8 oz (85 kg)  11/18/17 182 lb 9 oz (82.8 kg)  09/06/17 179 lb 12.8 oz (81.6 kg)   Temp Readings from Last 3 Encounters:  02/06/18 98.7 F (37.1 C) (Temporal)  11/29/17 98.1 F (36.7 C)  11/18/17 98.4 F (36.9 C) (Oral)   BP Readings from Last 3 Encounters:  02/06/18 (!) 149/69  11/29/17 136/68  11/18/17 123/68   Pulse Readings from Last 3 Encounters:  02/06/18 73  11/29/17 66  11/18/17 74    Physical Exam Vitals signs reviewed.  Constitutional:      General: She is not in acute distress.    Appearance: She is not diaphoretic.  Eyes:     Pupils: Pupils are equal, round, and reactive to light.  Cardiovascular:     Rate and Rhythm: Normal rate.  Pulmonary:     Effort: Pulmonary effort is normal.  Abdominal:     General: There is no distension.  Musculoskeletal:       Arms:  Skin:    General: Skin is dry.  Neurological:     Mental Status: She is alert and oriented to person, place, and time.     Cranial Nerves: No cranial nerve deficit.     Gait: Gait normal.     Lab Results  Component Value Date   CREATININE 0.83 11/29/2017  BUN 14 11/29/2017   NA 144 11/29/2017   K 3.9 11/29/2017   CL 104 11/29/2017   CO2 17 (L) 11/29/2017   Lab Results  Component Value Date   ALT 16 07/05/2017   AST 19 07/05/2017   ALKPHOS 102 07/05/2017   BILITOT 0.5 07/05/2017      No results found for this or any previous visit (from the past 72 hour(s)).  No results found.  ASSESSMENT AND PLAN:   Subacromial bursitis of right shoulder joint - Offered a injection today.  Patient refused.  Patient would like to  try a sling along with medication by mouth.  Will prescribe some prednisone.  She has no history of diabetes.    Discharge Instructions     Please take the prednisone.  Please wear your sling often.  If you feel like you need a shot in the shoulder please come back in.        The patient is advised to call or return to clinic if she does not see an improvement in symptoms, or to seek the care of the closest emergency department if she worsens with the above plan.   Philis Fendt, MHS, PA-C 02/06/2018 11:33 AM   Tereasa Coop, PA-C 02/06/18 1133

## 2018-02-06 NOTE — Discharge Instructions (Addendum)
Please take the prednisone.  Please wear your sling often.  If you feel like you need a shot in the shoulder please come back in.

## 2018-02-06 NOTE — ED Triage Notes (Addendum)
C/O right shoulder pain x approx 6 days without injury.  Pain worse with movements. Also c/o BLE swelling x approx 1 wk.

## 2018-02-17 ENCOUNTER — Ambulatory Visit (HOSPITAL_COMMUNITY)
Admission: EM | Admit: 2018-02-17 | Discharge: 2018-02-17 | Disposition: A | Discharge status: Home / Self Care | Payer: BLUE CROSS/BLUE SHIELD | Attending: Family Medicine | Admitting: Family Medicine

## 2018-02-17 ENCOUNTER — Encounter (HOSPITAL_COMMUNITY): Payer: Self-pay | Admitting: Emergency Medicine

## 2018-02-17 DIAGNOSIS — M109 Gout, unspecified: Secondary | ICD-10-CM

## 2018-02-17 MED ORDER — PREDNISONE 10 MG (21) PO TBPK: ORAL_TABLET | ORAL | 0 refills | Status: DC

## 2018-02-17 NOTE — ED Provider Notes (Signed)
[facility]    CSN: [id number] Arrival date & time: 02/17/18  1001     History   Chief Complaint Chief Complaint  Patient presents with  . Toe Pain    HPI EDNAMAE Lydia Walsh is a 63 y.o. female.   Is a 63 year old female who presents with left great toe pain that started this morning upon waking.  Symptoms have been constant and remain the same.  She does have a history of gout.  She reports this is similar to her gout flares.  She does take allopurinol.  She just finished prednisone for shoulder pain. She denies any injury to the foot.  There is some pain with ambulation.  She denies any fevers, myalgias numbness, tingling.  No history of diabetes.  ROS per HPI      Past Medical History:  Diagnosis Date  . Anemia   . Cancer [facility])    endometrial cancer  . Endometrial polyp   . GERD (gastroesophageal reflux disease)   . Heart murmur   . Heart palpitations   . History of blood transfusion   . History of chemotherapy   . History of radiation therapy   . History of uterine fibroid   . Hyperlipidemia   . Hypertension   . Hypothyroidism   . OSA (obstructive sleep apnea)    moderate OSA per study 06-21-2005--  per pt does need cpap anymore never purchased it  . Osteoarthritis   . PMB (postmenopausal bleeding)   . Wears dentures    upper  . Wears glasses     Patient Active Problem List   Diagnosis Date Noted  . Primary osteoarthritis of knees, bilateral 11/18/2016  . AV block, 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  . Viral upper respiratory tract infection 07/06/2016  . Restless leg syndrome 06/01/2016  . Chronic pain of left knee 02/03/2016  . Esophageal dysphagia 09/03/2015  . Port catheter in place 08/20/2015  . Hypokalemia 07/24/2015  . 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[place]) 07/10/2015  . Rash 07/02/2015  . Renal mass, left 06/21/2015  .  Nephrolithiasis 06/21/2015  . Obesity (BMI 30-39.9) 06/21/2015  . Iron deficiency anemia due to chronic blood loss 06/21/2015  . Endometrial cancer ([place]) 03/29/2015  . Palpitations 08/02/2013  . Major depression 06/02/2013  . Fatigue 05/02/2013  . Osteoarthritis   . LOW BACK PAIN, CHRONIC 02/27/2008  . GOUT 11/24/2007  . ALLERGIC RHINITIS, SEASONAL 03/09/2006  . G E R D 02/19/2006  . Hypothyroidism 02/17/2006  . Dyslipidemia 02/17/2006  . Essential hypertension 02/17/2006  . OBSTRUCTIVE SLEEP APNEA 06/21/2005    Past Surgical History:  Procedure Laterality Date  . ABDOMINAL HYSTERECTOMY    . BREAST EXCISIONAL BIOPSY Right    benign  . COLONOSCOPY    . DILATATION & CURETTAGE/HYSTEROSCOPY WITH MYOSURE N/A 03/07/2015   Procedure: DILATATION & CURETTAGE/HYSTEROSCOPY WITH MYOSURE (POLYP);  Surgeon: Arvella Nigh, MD;  [facility];  Service: Gynecology;  Laterality: N/A;  . DILATATION & CURRETTAGE/HYSTEROSCOPY WITH RESECTOCOPE  12-13-2002  &  09-26-2004   polyp/  submucosal fibroid  . ESOPHAGOGASTRODUODENOSCOPY    . EXCISION BENIGN RIGHT BREAST MASS  age 67  . IR REMOVAL TUN ACCESS W/ PORT W/O FL MOD SED  07/17/2016  . KNEE ARTHROSCOPY W/ MENISCECTOMY Bilateral right 04-12-2006//  left 12-29-2006   and Chondroplasty  . port placement    . ROBOTIC ASSISTED TOTAL HYSTERECTOMY WITH BILATERAL SALPINGO  OOPHERECTOMY Bilateral 05/02/2015   Procedure: XI ROBOTIC ASSISTED TOTAL HYSTERECTOMY WITH BILATERAL SALPINGO OOPHORECTOMY AND SENTINAL LYMPH NODE BIOPSY;  Surgeon: Janie Morning, MD;  Location: [facility];  Service: Gynecology;  Laterality: Bilateral;  . TRANSTHORACIC ECHOCARDIOGRAM  04-02-2010   normal LV, ef 65%  . TUBAL LIGATION  1980's    OB History   No obstetric history on file.      Home Medications    Prior to Admission medications   Medication Sig Start Date End Date Taking? Authorizing Provider  allopurinol (ZYLOPRIM) 100 MG tablet TAKE 1 TABLET BY  MOUTH ONCE DAILY 08/31/17  Yes Asencion Noble, MD  ibuprofen (ADVIL,MOTRIN) 200 MG tablet Take 200 mg by mouth as needed.   Yes [provider]  levothyroxine (SYNTHROID, LEVOTHROID) 125 MCG tablet TAKE 1 TABLET BY MOUTH ONCE DAILY 12/30/17  Yes Asencion Noble, MD  metoprolol succinate (TOPROL-XL) 50 MG 24 hr tablet TAKE 1 TABLET BY MOUTH ONCE DAILY IMMEDIATELY FOLLOWING A MEAL 09/28/17  Yes Asencion Noble, MD  Multiple Vitamins-Minerals (CENTRUM PO) Take by mouth.   Yes [provider]  omeprazole (PRILOSEC) 20 MG capsule TAKE 1 CAPSULE BY MOUTH ONCE DAILY 09/23/17  Yes Annia Belt, MD  potassium chloride (K-DUR) 10 MEQ tablet TAKE 1 TABLET BY MOUTH ONCE DAILY 08/31/17  Yes Asencion Noble, MD  simvastatin (ZOCOR) 20 MG tablet TAKE 1 TABLET BY MOUTH EVERY DAY AT BEDTIME 09/17/17  Yes Asencion Noble, MD  spironolactone (ALDACTONE) 25 MG tablet Take 2 tablets (50 mg total) by mouth daily. D/c previous spironolactone prescription 11/24/17  Yes Asencion Noble, MD  diclofenac sodium (VOLTAREN) 1 % GEL APPLY 2 GRAMS TOPICALLY 4 TIMES DAILY 11/30/17   Asencion Noble, MD  fluticasone [facility]) 50 MCG/ACT nasal spray Spray 1-2 sprays per nostril per day as needed for treatment of allergies. 06/01/17 07/12/17  Thomasene Ripple, MD  OVER THE COUNTER MEDICATION Alive Women's 50+ Gummie Multivitamin    [provider]  predniSONE (STERAPRED UNI-PAK 21 TAB) 10 MG (21) TBPK tablet 6 tabs for 1 day, then 5 tabs for 1 das, then 4 tabs for 1 day, then 3 tabs for 1 day, 2 tabs for 1 day, then 1 tab for 1 day 02/17/18   Loura Halt A, NP  esomeprazole (NEXIUM) 20 MG capsule Take 2 capsules (40 mg total) by mouth daily before breakfast. 10/15/10 04/08/11  Rosalia Hammers, MD    Family History Family History  Problem Relation Age of Onset  . Hypertension Mother   . Cirrhosis Father        alcoholic  . Heart disease Son   . Colon cancer Neg Hx   . Stomach cancer  Neg Hx   . Rectal cancer Neg Hx   . Esophageal cancer Neg Hx   . Liver cancer Neg Hx     Social History Social History   Tobacco Use  . Smoking status: Former Smoker    Years: 2.00    Types: Cigarettes    Last attempt to quit: 02/28/1994    Years since quitting: 23.9  . Smokeless tobacco: Never Used  Substance Use Topics  . Alcohol use: No    Alcohol/week: 0.0 standard drinks  . Drug use: No     Allergies   Hctz [hydrochlorothiazide]   Review of Systems Review of Systems   Physical Exam Triage Vital Signs ED Triage Vitals  Enc Vitals Group     BP 02/17/18 1104 (!) 176/87  Pulse Rate 02/17/18 1104 88     Resp 02/17/18 1104 18     Temp 02/17/18 1104 98.4 F (36.9 C)     Temp Source 02/17/18 1104 Oral     SpO2 02/17/18 1104 100 %     Weight --      Height --      Head Circumference --      Peak Flow --      Pain Score 02/17/18 1111 8     Pain Loc --      Pain Edu? --      Excl. in [place]? --    No data found.  Updated Vital Signs BP (!) 176/87 (BP Location: Left Arm)   Pulse 88   Temp 98.4 F (36.9 C) (Oral)   Resp 18   LMP 07/16/2014   SpO2 100%   Visual Acuity Right Eye Distance:   Left Eye Distance:   Bilateral Distance:    Right Eye Near:   Left Eye Near:    Bilateral Near:     Physical Exam Vitals signs and nursing note reviewed.  Constitutional:      General: She is not in acute distress.    Appearance: She is well-developed.  HENT:     Head: Normocephalic and atraumatic.     Nose: Nose normal.  Eyes:     Conjunctiva/sclera: Conjunctivae normal.  Neck:     Musculoskeletal: Normal range of motion.  Cardiovascular:     Pulses: Normal pulses.     Heart sounds: No murmur.  Musculoskeletal: Normal range of motion.     Comments: Mild swelling and redness to the left great toe Good flexion-extension of the toe. Sensation and pedal pulse intact.  Skin:    General: Skin is warm and dry.  Neurological:     Mental Status: She is  alert.  Psychiatric:        Mood and Affect: Mood normal.      UC Treatments / Results  Labs (all labs ordered are listed, but only abnormal results are displayed) Labs Reviewed - No data to display  EKG None  Radiology No results found.  Procedures Procedures (including critical care time)  Medications Ordered in UC Medications - No data to display  Initial Impression / Assessment and Plan / UC Course  I have reviewed the triage vital signs and the nursing notes.  Pertinent labs & imaging results that were available during my care of the patient were reviewed by me and considered in my medical decision making (see chart for details).     Symptoms and presentation consistent with gout We will do prednisone taper over the next 6 days We will have her take this daily with food Work note given as requested Follow up as needed for continued or worsening symptoms  Final Clinical Impressions(s) / UC Diagnoses   Final diagnoses:  Acute gout involving toe of left foot, unspecified cause     Discharge Instructions     We are treating you for Gout  Prednisone  taper for 6 days. Take with food.  Work note given     ED Prescriptions    Medication Sig Dispense Auth. Provider   predniSONE (STERAPRED UNI-PAK 21 TAB) 10 MG (21) TBPK tablet 6 tabs for 1 day, then 5 tabs for 1 das, then 4 tabs for 1 day, then 3 tabs for 1 day, 2 tabs for 1 day, then 1 tab for 1 day 21 tablet Loura Halt A, NP  Controlled Substance Prescriptions [place] Controlled Substance Registry consulted? Not Applicable   Orvan July, NP 02/17/18 1139

## 2018-02-17 NOTE — Discharge Instructions (Signed)
We are treating you for Gout  Prednisone  taper for 6 days. Take with food.  Work note given

## 2018-02-17 NOTE — ED Triage Notes (Signed)
Pt c/o bilateral foot pain onset this am... sts she got up and walked and noticed the pain  Denies inj/trauma... hx of gout.... Pt believes it may be d/t gout  Sts she did not go to work d/t severe pain  Slow and steady gait... ambulatory ... NAD

## 2018-03-07 ENCOUNTER — Encounter: Payer: Self-pay | Admitting: Radiation Oncology

## 2018-03-07 ENCOUNTER — Other Ambulatory Visit: Payer: Self-pay

## 2018-03-07 ENCOUNTER — Ambulatory Visit
Admission: RE | Admit: 2018-03-07 | Discharge: 2018-03-07 | Disposition: A | Discharge status: Home / Self Care | Payer: BLUE CROSS/BLUE SHIELD | Source: Ambulatory Visit | Attending: Radiation Oncology | Admitting: Radiation Oncology

## 2018-03-07 VITALS — BP 158/75 | HR 74 | Temp 98.0°F | Resp 18 | Ht 60.0 in | Wt 187.2 lb

## 2018-03-07 DIAGNOSIS — Z08 Encounter for follow-up examination after completed treatment for malignant neoplasm: Secondary | ICD-10-CM | POA: Diagnosis not present

## 2018-03-07 DIAGNOSIS — Z8542 Personal history of malignant neoplasm of other parts of uterus: Secondary | ICD-10-CM | POA: Diagnosis not present

## 2018-03-07 DIAGNOSIS — C541 Malignant neoplasm of endometrium: Secondary | ICD-10-CM | POA: Diagnosis not present

## 2018-03-07 DIAGNOSIS — Z79899 Other long term (current) drug therapy: Secondary | ICD-10-CM | POA: Diagnosis not present

## 2018-03-07 NOTE — Progress Notes (Signed)
Pt presents today for f/u with Dr. Sondra Come. Pt reports lingering fatigue. Pt reports bilateral upper chest/"kind of by my throat" pain with swallowing, and occasionally unpredictable. Pt denies dysuria/hematuria. Pt denies vaginal bleeding, discharge, itching. Pt denies rectal bleeding. Pt reports occasional diarrhea. Pt denies abdominal bloating, N/V.    Pt with questions on which provider to see to get her annual mammogram. Pt is past due by one year.   BP (!) 158/75 (BP Location: Left Arm, Patient Position: Sitting)   Pulse 74   Temp 98 F (36.7 C) (Oral)   Resp 18   Ht 5' (1.524 m)   Wt 187 lb 4 oz (84.9 kg)   LMP 07/16/2014   SpO2 99%   BMI 36.57 kg/m   Wt Readings from Last 3 Encounters:  03/07/18 187 lb 4 oz (84.9 kg)  11/29/17 187 lb 4.8 oz (85 kg)  11/18/17 182 lb 9 oz (82.8 kg)   Loma Sousa, RN BSN

## 2018-03-07 NOTE — Progress Notes (Signed)
Radiation Oncology         [phone] ________________________________  Name: Lydia Walsh MRN: [id number]  Date: 03/07/2018  DOB: [birth year]    Follow-Up Visit Note  CC: Asencion Noble, MD  Gordy Levan, MD    ICD-10-CM   1. Endometrial cancer ([place]) C54.1   2. 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  Narrative:  The patient returns today for routine follow-up. It has been two years since her last mammogram and she is interested in scheduling one, but she denies breast pain, nipple discharge or bleeding.  Since her last visit to the office, she had two ER visits. She presented to the ER on January 26 for right sided shoulder pain. She was offered an injection but refused. She was given a sling to wear and discharged. She again presented to the ER on February 6 with severe big toe pain. She described it to the provider as similar to a gout flare-up. She was placed on a 6 day Prednisone taper and discharged.   On review of systems, she reports work-related fatigue. she denies vaginal bleeding, pelvic pain and any other symptoms. Pertinent positives are listed and detailed within the above HPI.   ALLERGIES:  is allergic to hctz [hydrochlorothiazide].  Meds: Current Outpatient Medications  Medication Sig Dispense Refill  . allopurinol (ZYLOPRIM) 100 MG tablet TAKE 1 TABLET BY MOUTH ONCE DAILY 90 tablet 1  . fluticasone (FLONASE) 50 MCG/ACT nasal spray Spray 1-2 sprays per nostril per day as needed for treatment of allergies. 16 g 5  . ibuprofen  (ADVIL,MOTRIN) 200 MG tablet Take 200 mg by mouth as needed.    Marland Kitchen levothyroxine (SYNTHROID, LEVOTHROID) 125 MCG tablet TAKE 1 TABLET BY MOUTH ONCE DAILY 90 tablet 0  . metoprolol succinate (TOPROL-XL) 50 MG 24 hr tablet TAKE 1 TABLET BY MOUTH ONCE DAILY IMMEDIATELY FOLLOWING A MEAL 30 tablet 11  . omeprazole (PRILOSEC) 20 MG capsule TAKE 1 CAPSULE BY MOUTH ONCE DAILY 30 capsule 11  . OVER THE COUNTER MEDICATION Alive Women's 50+ Gummie Multivitamin    . potassium chloride (K-DUR) 10 MEQ tablet TAKE 1 TABLET BY MOUTH ONCE DAILY 90 tablet 3  . simvastatin (ZOCOR) 20 MG tablet TAKE 1 TABLET BY MOUTH EVERY DAY AT BEDTIME 30 tablet 11  . spironolactone (ALDACTONE) 25 MG tablet Take 2 tablets (50 mg total) by mouth daily. D/c previous spironolactone prescription 180 tablet 3  . diclofenac sodium (VOLTAREN) 1 % GEL APPLY 2 GRAMS TOPICALLY 4 TIMES DAILY (Patient not taking: Reported on 03/07/2018) 100 g 1  . Multiple Vitamins-Minerals (CENTRUM PO) Take by mouth.    . predniSONE (STERAPRED UNI-PAK 21 TAB) 10 MG (21) TBPK tablet 6 tabs for 1 day, then 5 tabs for 1 das, then 4 tabs for 1 day, then 3 tabs for 1 day, 2 tabs for 1 day, then 1 tab for 1 day (Patient not taking: Reported on 03/07/2018) 21 tablet 0   No current facility-administered medications for this encounter.     Physical Findings: The patient is in no acute distress. Patient is  alert and oriented.  height is 5' (1.524 m) and weight is 187 lb 4 oz (84.9 kg). Her oral temperature is 98 F (36.7 C). Her blood pressure is 158/75 (abnormal) and her pulse is 74. Her respiration is 18 and oxygen saturation is 99%.   Lungs are clear to auscultation bilaterally. Heart has regular rate and rhythm. No palpable cervical, supraclavicular, or axillary adenopathy. Abdomen soft, non-tender, normal bowel sounds.   On pelvic examination the external genitalia were unremarkable. A speculum exam was performed. The vaginal vault was noted to be shortened. There  are no mucosal lesions noted in the vaginal vault. On bimanual and rectovaginal examination there were no pelvic masses appreciated.   Lab Findings: Lab Results  Component Value Date   WBC 4.6 07/05/2017   HGB 12.0 07/05/2017   HCT 36.8 07/05/2017   MCV 89.9 07/05/2017   PLT 204 07/05/2017    Radiographic Findings: No results found.  Impression:  No evidence of recurrence on clinical exam.   Plan: Patient will see Dr. Skeet Latch in May. She will follow-up in radiation oncology in August. She will contact her PCP to get a mammogram scheduled.   -----------------------------------  Blair Promise, PhD, MD  This document serves as a record of services personally performed by Gery Pray, MD. It was created on his behalf by Mary-Margaret Loma Messing, a trained medical scribe. The creation of this record is based on the scribe's personal observations and the provider's statements to them. This document has been checked and approved by the attending provider.

## 2018-03-17 ENCOUNTER — Other Ambulatory Visit: Payer: Self-pay

## 2018-03-17 ENCOUNTER — Ambulatory Visit (INDEPENDENT_AMBULATORY_CARE_PROVIDER_SITE_OTHER): Payer: BLUE CROSS/BLUE SHIELD | Admitting: Internal Medicine

## 2018-03-17 ENCOUNTER — Encounter: Payer: Self-pay | Admitting: Internal Medicine

## 2018-03-17 VITALS — BP 137/64 | HR 85 | Temp 98.0°F | Wt 188.0 lb

## 2018-03-17 DIAGNOSIS — R1011 Right upper quadrant pain: Secondary | ICD-10-CM

## 2018-03-17 DIAGNOSIS — Z9049 Acquired absence of other specified parts of digestive tract: Secondary | ICD-10-CM

## 2018-03-17 DIAGNOSIS — R5381 Other malaise: Secondary | ICD-10-CM

## 2018-03-17 DIAGNOSIS — R1010 Upper abdominal pain, unspecified: Secondary | ICD-10-CM | POA: Insufficient documentation

## 2018-03-17 DIAGNOSIS — R5383 Other fatigue: Secondary | ICD-10-CM | POA: Diagnosis not present

## 2018-03-17 DIAGNOSIS — R1012 Left upper quadrant pain: Secondary | ICD-10-CM | POA: Diagnosis not present

## 2018-03-17 DIAGNOSIS — Z79899 Other long term (current) drug therapy: Secondary | ICD-10-CM

## 2018-03-17 DIAGNOSIS — Z8544 Personal history of malignant neoplasm of other female genital organs: Secondary | ICD-10-CM

## 2018-03-17 DIAGNOSIS — M7989 Other specified soft tissue disorders: Secondary | ICD-10-CM

## 2018-03-17 DIAGNOSIS — R110 Nausea: Secondary | ICD-10-CM

## 2018-03-17 DIAGNOSIS — K219 Gastro-esophageal reflux disease without esophagitis: Secondary | ICD-10-CM

## 2018-03-17 LAB — CBC
HCT: 40.3 % (ref 36.0–46.0)
Hemoglobin: 12.5 g/dL (ref 12.0–15.0)
MCH: 28.3 pg (ref 26.0–34.0)
MCHC: 31 g/dL (ref 30.0–36.0)
MCV: 91.4 fL (ref 80.0–100.0)
Platelets: 198 10*3/uL (ref 150–400)
RBC: 4.41 MIL/uL (ref 3.87–5.11)
RDW: 13.2 % (ref 11.5–15.5)
WBC: 6.1 10*3/uL (ref 4.0–10.5)
nRBC: 0 % (ref 0.0–0.2)

## 2018-03-17 LAB — COMPREHENSIVE METABOLIC PANEL
ALT: 23 U/L (ref 0–44)
AST: 28 U/L (ref 15–41)
Albumin: 4.1 g/dL (ref 3.5–5.0)
Alkaline Phosphatase: 105 U/L (ref 38–126)
Anion gap: 11 (ref 5–15)
BUN: 12 mg/dL (ref 8–23)
CO2: 24 mmol/L (ref 22–32)
Calcium: 9.7 mg/dL (ref 8.9–10.3)
Chloride: 106 mmol/L (ref 98–111)
Creatinine, Ser: 0.79 mg/dL (ref 0.44–1.00)
GFR calc Af Amer: >60 mL/min (ref >60)
GFR calc non Af Amer: >60 mL/min (ref >60)
Glucose, Bld: 115 mg/dL — ABNORMAL HIGH (ref 70–99)
Potassium: 4 mmol/L (ref 3.5–5.1)
Sodium: 141 mmol/L (ref 135–145)
Total Bilirubin: 0.7 mg/dL (ref 0.3–1.2)
Total Protein: 6.9 g/dL (ref 6.5–8.1)

## 2018-03-17 NOTE — Progress Notes (Signed)
   CC: Stomach pain  HPI:  Lydia Walsh is a 63 y.o. with PMH as below.   Please see A&P for assessment of the patient's acute and chronic medical conditions.   Past Medical History:  Diagnosis Date  . Anemia   . Cancer [facility])    endometrial cancer  . Endometrial polyp   . GERD (gastroesophageal reflux disease)   . Heart murmur   . Heart palpitations   . History of blood transfusion   . History of chemotherapy   . History of radiation therapy   . History of uterine fibroid   . Hyperlipidemia   . Hypertension   . Hypothyroidism   . OSA (obstructive sleep apnea)    moderate OSA per study 06-21-2005--  per pt does need cpap anymore never purchased it  . Osteoarthritis   . PMB (postmenopausal bleeding)   . Wears dentures    upper  . Wears glasses    Review of Systems:   Review of Systems  Constitutional: Positive for malaise/fatigue. Negative for chills, fever and weight loss.  Respiratory: Negative for cough, shortness of breath and wheezing.   Cardiovascular: Positive for leg swelling. Negative for chest pain and orthopnea.  Gastrointestinal: Positive for abdominal pain and nausea. Negative for blood in stool, constipation, diarrhea, heartburn and vomiting.  Genitourinary: Negative for dysuria, flank pain, frequency, hematuria and urgency.  Musculoskeletal: Positive for joint pain. Negative for neck pain.  Neurological: Negative for tingling.    Physical Exam:  Constitution: NAD, obese Eyes: no scleral icterus or injection  Cardio: RRR, no m/r/g  Respiratory: CTA, no w/r/r  Abdominal: Acutely TTP LUQ and RUQ, unable to perform deep palpation due to pain, +BS, no CVA tenderness  MSK: moving all extremities, non-pitting edema in b/l LE  Neuro: a&ox3 Skin: c/d/i    Vitals:   03/17/18 0837  BP: 137/64  Pulse: 85  Temp: 98 F (36.7 C)  TempSrc: Oral  SpO2: 100%  Weight: 188 lb (85.3 kg)     Assessment & Plan:   See Encounters Tab for problem based  charting.  Patient discussed with Dr. Dareen Piano

## 2018-03-17 NOTE — Assessment & Plan Note (Signed)
Pain in RUQ and LUQ with movement and palpation. It's been present intermittently for years but became acutely worse in the last three weeks. She denies changes in BM except for occasional diarrhea, no change in color. The pain seems to get worse one hour after eating fatty foods. She has a history of cholecystectomy last year. She denies dysuria or changes in urination. She is extremely TTP, and I was unable to perform deep palpation, left being more tender than the right. No rib tenderness. 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 She states she has not had any symptoms of reflux and feels her GERD symptoms are well controlled on protonix 40 mg.    - prescribed Ibuprofen q6h for pain as this may be MSK - CBC, CMP  - CT scan abdomen pelvis with history of endometrial cancer, kidney lesion in 2017 and intensity of pain.   - discussed spacing out fatty foods as she often eats large amounts at once, which could be contributing to some of her abdominal pain s/p cholecystectomy.

## 2018-03-17 NOTE — Patient Instructions (Addendum)
Thank you for allowing [place] to provide your care today. Today we discussed your abdominal and side pain   I have ordered the following labs for you:  Basic metabolic panel, urinalysis, complete metabolic panel    I will call if any are abnormal.    Today we made the following changes to your medications:   Please START taking  Ibuprofen 200 or 400 MG tablet every 6 hours as needed for pain   I have ordered and abdominal CT scan. Please make sure to get this done in the next few days.   Please follow-up in one week if symptoms do not resolve.    Should you have any questions or concerns please call the internal medicine clinic at [phone].

## 2018-03-18 LAB — MICROSCOPIC EXAMINATION: Casts: NONE SEEN /lpf

## 2018-03-18 LAB — URINALYSIS, ROUTINE W REFLEX MICROSCOPIC
Bilirubin, UA: NEGATIVE
Glucose, UA: NEGATIVE
Ketones, UA: NEGATIVE
Nitrite, UA: NEGATIVE
Protein, UA: NEGATIVE
RBC, UA: NEGATIVE
Specific Gravity, UA: 1.016 (ref 1.005–1.030)
Urobilinogen, Ur: 0.2 mg/dL (ref 0.2–1.0)
pH, UA: 5 (ref 5.0–7.5)

## 2018-03-18 NOTE — Progress Notes (Signed)
Internal Medicine Clinic Attending  Case discussed with Dr. Seawell at the time of the visit.  We reviewed the resident's history and exam and pertinent patient test results.  I agree with the assessment, diagnosis, and plan of care documented in the resident's note.    

## 2018-03-23 ENCOUNTER — Other Ambulatory Visit: Payer: Self-pay | Admitting: Internal Medicine

## 2018-03-23 DIAGNOSIS — R1010 Upper abdominal pain, unspecified: Secondary | ICD-10-CM

## 2018-03-30 ENCOUNTER — Other Ambulatory Visit: Payer: Self-pay | Admitting: Internal Medicine

## 2018-03-30 DIAGNOSIS — E039 Hypothyroidism, unspecified: Secondary | ICD-10-CM

## 2018-03-31 ENCOUNTER — Ambulatory Visit (HOSPITAL_COMMUNITY)
Admission: RE | Admit: 2018-03-31 | Discharge: 2018-03-31 | Disposition: A | Discharge status: Home / Self Care | Payer: BLUE CROSS/BLUE SHIELD | Source: Ambulatory Visit | Attending: Internal Medicine | Admitting: Internal Medicine

## 2018-03-31 ENCOUNTER — Other Ambulatory Visit: Payer: Self-pay

## 2018-03-31 DIAGNOSIS — R1010 Upper abdominal pain, unspecified: Secondary | ICD-10-CM | POA: Diagnosis not present

## 2018-03-31 DIAGNOSIS — R1011 Right upper quadrant pain: Secondary | ICD-10-CM | POA: Diagnosis not present

## 2018-03-31 MED ORDER — IOHEXOL 300 MG/ML  SOLN
100.0000 mL | Freq: Once | INTRAMUSCULAR | Status: AC | PRN
  Administered 2018-03-31: 100 mL via INTRAVENOUS

## 2018-04-01 NOTE — Telephone Encounter (Signed)
Needs refill on levothyroxine (SYNTHROID, LEVOTHROID) 125 MCG tablet [facility] ([facility]), [place] ;pt contact [phone]

## 2018-04-05 ENCOUNTER — Other Ambulatory Visit: Payer: Self-pay | Admitting: Internal Medicine

## 2018-04-05 DIAGNOSIS — N2889 Other specified disorders of kidney and ureter: Secondary | ICD-10-CM

## 2018-04-05 DIAGNOSIS — R1010 Upper abdominal pain, unspecified: Secondary | ICD-10-CM

## 2018-05-19 ENCOUNTER — Telehealth: Payer: Self-pay | Admitting: *Deleted

## 2018-05-19 NOTE — Progress Notes (Signed)
ENDOMETRIAL CANCER FOLLOW-UP VISIT  CHIEF COMPLAINT:  Endometrial cancer surveillance  Assessment:   63 y.o.  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   S/p robotic assisted total hysterectomy, BSO, sentinel lymph node biopsy on 05/02/15 with post-op imaging suggestive of involved lymph nodes. S/p chemoradiation with carboplatin and paclitaxel x 6 cycles and sandwich external beam pelvic radiation (completed 10/24/15) with chemotherapy completed 11/27/15.  NED  Plan:  Follow-up: Dr Sondra Come 08/2018 as scheduled and Dr Skeet Latch 11/2018 Per [facility] guidelines for f/u of endometrial cancer,pap tests  are not longer recommended.  Renal lesion: F/U with urology as scheduled  Atherosclerosis Follow-up with PCP  HPI:  Lydia Walsh is a 63 y.o. year old G3P3. initially seen in consultation on 03/29/15 referred by Dr Radene Knee for grade 2 endometrial cancer.  She then underwent a robotic assisted total hysterectomy, BSO, sentinel lymph node biopsy with Dr Skeet Latch on 2/62/03 without complications.  Her postoperative course was uncomplicated.  Her final pathologic diagnosis is a Stage IB Grade 2 endometrioid endometrial cancer with positive lymphovascular space invasion, 14/15 mm (90%) of myometrial invasion and negative right lymph nodes (the left SLN specimen was negative for nodal mets), MSI-Stable  Postoperative imaging on 05/24/15 showed mild left comon iliac adenopathy. This region had not been sampled surgically. On PET scan on 06/12/15 there was increased uptake at the left common iliac node consistent with metastastatic disease. A 1.8cm left lower pole renal lesion was also identified.  She was diagnosed with clinical stage IIIC disease and was prescribed sandwiched chemotherapy and radiation. She received 6 cycles of carboplatin and paclitaxel between 06/26/15 and 11/27/15 with whole pelvic external beam radiation administered after 3 cycles from July through 10/24/15.   She  tolerated treatment well.   On post-treatment CT abdo/pelvis on 12/27/15 showed: No residual lymphadenopathy or other evidence of metastatic endometrial carcinoma. 2. Persistent hypodense mass involving the lower pole of the left kidney, likely enhancing as correlated with previous PET-CT. This remains worrisome for renal cell carcinoma.  Followed by urology, last seen 1 year ago  CT abd/pelvis 03/2018 IMPRESSION: 1. No acute abnormality within the abdomen or pelvis. 2. Interval cholecystectomy compared to 12/27/2015. Small 2.2 x 1.8 cm circumscribed low-density structure within the gallbladder fossa of uncertain etiology. This may represent a hepatic cyst, or small loculated biloma 3. Stable size and appearance of a 1.4 x 1.4 cm low-attenuation lesion in the medial aspect of the lower pole of the left kidney. Again, this lesion demonstrates what appears to be delayed enhancement and remains concerning for an indolent renal cell neoplasm such as papillary renal cell carcinoma. 4.  Aortic Atherosclerosis (ICD10-170.0) 5. Coronary artery calcifications. 6. Multilevel lower lumbar degenerative disc disease with mild grade1 anterolisthesis of L4 on L5.    Past Medical History:  Diagnosis Date  . Anemia   . Cancer [facility])    endometrial cancer  . Endometrial polyp   . GERD (gastroesophageal reflux disease)   . Heart murmur   . Heart palpitations   . History of blood transfusion   . History of chemotherapy   . History of radiation therapy   . History of uterine fibroid   . Hyperlipidemia   . Hypertension   . Hypothyroidism   . OSA (obstructive sleep apnea)    moderate OSA per study 06-21-2005--  per pt does need cpap anymore never purchased it  . Osteoarthritis   . PMB (postmenopausal bleeding)   . Wears dentures  upper  . Wears glasses    Past Surgical History:  Procedure Laterality Date  . ABDOMINAL HYSTERECTOMY    . BREAST EXCISIONAL BIOPSY Right    benign  . COLONOSCOPY     . DILATATION & CURETTAGE/HYSTEROSCOPY WITH MYOSURE N/A 03/07/2015   Procedure: DILATATION & CURETTAGE/HYSTEROSCOPY WITH MYOSURE (POLYP);  Surgeon: Arvella Nigh, MD;  [facility];  Service: Gynecology;  Laterality: N/A;  . DILATATION & CURRETTAGE/HYSTEROSCOPY WITH RESECTOCOPE  12-13-2002  &  09-26-2004   polyp/  submucosal fibroid  . ESOPHAGOGASTRODUODENOSCOPY    . EXCISION BENIGN RIGHT BREAST MASS  age 44  . IR REMOVAL TUN ACCESS W/ PORT W/O FL MOD SED  07/17/2016  . KNEE ARTHROSCOPY W/ MENISCECTOMY Bilateral right 04-12-2006//  left 12-29-2006   and Chondroplasty  . port placement    . ROBOTIC ASSISTED TOTAL HYSTERECTOMY WITH BILATERAL SALPINGO OOPHERECTOMY Bilateral 05/02/2015   Procedure: XI ROBOTIC ASSISTED TOTAL HYSTERECTOMY WITH BILATERAL SALPINGO OOPHORECTOMY AND SENTINAL LYMPH NODE BIOPSY;  Surgeon: Janie Morning, MD;  Location: [facility];  Service: Gynecology;  Laterality: Bilateral;  . TRANSTHORACIC ECHOCARDIOGRAM  04-02-2010   normal LV, ef 65%  . TUBAL LIGATION  1980's   Family History  Problem Relation Age of Onset  . Hypertension Mother   . Cirrhosis Father        alcoholic  . Heart disease Son   . Colon cancer Neg Hx   . Stomach cancer Neg Hx   . Rectal cancer Neg Hx   . Esophageal cancer Neg Hx   . Liver cancer Neg Hx    Social History   Socioeconomic History  . Marital status: Widowed    Spouse name: Not on file  . Number of children: 3  . Years of education: Not on file  . Highest education level: Not on file  Occupational History  . Occupation: Production manager, [employer]  Social Needs  . Financial resource strain: Not on file  . Food insecurity:    Worry: Not on file    Inability: Not on file  . Transportation needs:    Medical: Not on file    Non-medical: Not on file  Tobacco Use  . Smoking status: Former Smoker    Years: 2.00    Types: Cigarettes    Last attempt to quit: 02/28/1994    Years since quitting: 24.2  .  Smokeless tobacco: Never Used  Substance and Sexual Activity  . Alcohol use: No    Alcohol/week: 0.0 standard drinks  . Drug use: No  . Sexual activity: Not on file  Lifestyle  . Physical activity:    Days per week: Not on file    Minutes per session: Not on file  . Stress: Not on file  Relationships  . Social connections:    Talks on phone: Not on file    Gets together: Not on file    Attends religious service: Not on file    Active member of club or organization: Not on file    Attends meetings of clubs or organizations: Not on file    Relationship status: Not on file  . Intimate partner violence:    Fear of current or ex partner: Not on file    Emotionally abused: Not on file    Physically abused: Not on file    Forced sexual activity: Not on file  Other Topics Concern  . Not on file  Social History Narrative   Widowed she has 3 sons one died  from heart issues.   She is employed with security at [employer]   No alcohol tobacco or drug use   Allergies  Allergen Reactions  . Hctz [Hydrochlorothiazide] Rash   Current Outpatient Medications on File Prior to Visit  Medication Sig Dispense Refill  . allopurinol (ZYLOPRIM) 100 MG tablet TAKE 1 TABLET BY MOUTH ONCE DAILY 90 tablet 1  . diclofenac sodium (VOLTAREN) 1 % GEL APPLY 2 GRAMS TOPICALLY 4 TIMES DAILY (Patient not taking: Reported on 03/07/2018) 100 g 1  . fluticasone (FLONASE) 50 MCG/ACT nasal spray Spray 1-2 sprays per nostril per day as needed for treatment of allergies. 16 g 5  . ibuprofen (ADVIL,MOTRIN) 200 MG tablet Take 200 mg by mouth as needed.    Marland Kitchen levothyroxine (SYNTHROID, LEVOTHROID) 125 MCG tablet Take 1 tablet by mouth once daily 90 tablet 2  . metoprolol succinate (TOPROL-XL) 50 MG 24 hr tablet TAKE 1 TABLET BY MOUTH ONCE DAILY IMMEDIATELY FOLLOWING A MEAL 30 tablet 11  . Multiple Vitamins-Minerals (CENTRUM PO) Take by mouth.    Marland Kitchen omeprazole (PRILOSEC) 20 MG capsule TAKE 1 CAPSULE BY MOUTH ONCE  DAILY 30 capsule 11  . OVER THE COUNTER MEDICATION Alive Women's 50+ Gummie Multivitamin    . potassium chloride (K-DUR) 10 MEQ tablet TAKE 1 TABLET BY MOUTH ONCE DAILY 90 tablet 3  . predniSONE (STERAPRED UNI-PAK 21 TAB) 10 MG (21) TBPK tablet 6 tabs for 1 day, then 5 tabs for 1 das, then 4 tabs for 1 day, then 3 tabs for 1 day, 2 tabs for 1 day, then 1 tab for 1 day (Patient not taking: Reported on 03/07/2018) 21 tablet 0  . simvastatin (ZOCOR) 20 MG tablet TAKE 1 TABLET BY MOUTH EVERY DAY AT BEDTIME 30 tablet 11  . spironolactone (ALDACTONE) 25 MG tablet Take 2 tablets (50 mg total) by mouth daily. D/c previous spironolactone prescription 180 tablet 3  . [DISCONTINUED] esomeprazole (NEXIUM) 20 MG capsule Take 2 capsules (40 mg total) by mouth daily before breakfast. 30 capsule 3   No current facility-administered medications on file prior to visit.      Review of systems: Review of Systems  Constitutional: Positive for malaise/fatigue. Negative for chills, fever and weight loss.       Reports fatigue. Improved with vitamims  Respiratory: Negative for hemoptysis, shortness of breath and wheezing.   Cardiovascular: Negative for chest pain and leg swelling.  Gastrointestinal: Positive for diarrhea. Negative for abdominal pain, constipation, nausea and vomiting.       Intermittent diarrhea since cholecystectomy   Genitourinary: Positive for frequency. Negative for dysuria and urgency.       No vaginal bleeding or discharge  Musculoskeletal: Positive for joint pain. Negative for back pain and neck pain.       Bilateral knee pain  Neurological: Negative for dizziness, tingling, tremors, weakness and headaches.     15 minutes spent in phone conversation with Lydia Walsh from her home in [place].  An additional 6 minutes was spent in chart review and documentation.  She was in a private location and voiced an understanding of the limitations of a phone visit in this time of [place] pandemic.

## 2018-05-19 NOTE — Telephone Encounter (Signed)
Called and left the patient a message to call the office back. Need to see if the patient still wants to come in for her appt or have a virtual visit.

## 2018-05-20 NOTE — Telephone Encounter (Signed)
Patient called back and changed her appt to a phone visit

## 2018-05-20 NOTE — Telephone Encounter (Signed)
Called and spoke with the patient regarding her appt for next week. Patient to keep her in person appt

## 2018-05-26 ENCOUNTER — Inpatient Hospital Stay: Payer: BC Managed Care – PPO | Attending: Gynecologic Oncology | Admitting: Gynecologic Oncology

## 2018-05-26 DIAGNOSIS — N2889 Other specified disorders of kidney and ureter: Secondary | ICD-10-CM

## 2018-05-26 DIAGNOSIS — C541 Malignant neoplasm of endometrium: Secondary | ICD-10-CM | POA: Insufficient documentation

## 2018-05-26 DIAGNOSIS — Z90710 Acquired absence of both cervix and uterus: Secondary | ICD-10-CM

## 2018-05-26 DIAGNOSIS — Z923 Personal history of irradiation: Secondary | ICD-10-CM | POA: Diagnosis not present

## 2018-05-26 DIAGNOSIS — Z9221 Personal history of antineoplastic chemotherapy: Secondary | ICD-10-CM

## 2018-05-26 DIAGNOSIS — I700 Atherosclerosis of aorta: Secondary | ICD-10-CM

## 2018-05-26 DIAGNOSIS — Z90722 Acquired absence of ovaries, bilateral: Secondary | ICD-10-CM | POA: Insufficient documentation

## 2018-05-26 NOTE — Patient Instructions (Signed)
Atherosclerosis  Atherosclerosis is narrowing and hardening of the arteries. Arteries are blood vessels that carry blood from the heart to all parts of the body. This blood contains oxygen. Arteries can become narrow or clogged with a buildup of fat, cholesterol, calcium, and other substances (plaque). Plaque decreases the amount of blood that can flow through the artery. Atherosclerosis can affect any artery in the body, including:  Heart arteries (coronary artery disease). This may cause a heart attack.  Brain arteries. This may cause a stroke (cerebrovascular accident).  Leg, arm, and pelvis arteries (peripheral artery disease). This may cause pain and numbness.  Kidney arteries. This may cause kidney (renal) failure. Treatment may slow the disease and prevent further damage to the heart, brain, peripheral arteries, and kidneys. What are the causes? Atherosclerosis develops slowly over many years. The inner layers of your arteries become damaged and allow the gradual buildup of plaque. The exact cause of atherosclerosis is not fully understood. Symptoms of atherosclerosis do not occur until the artery becomes narrow or blocked. What increases the risk? The following factors may make you more likely to develop this condition:  High blood pressure.  High cholesterol.  Being middle-aged or older.  Having a family history of atherosclerosis.  Having high blood fats (triglycerides).  Diabetes.  Being overweight.  Smoking tobacco.  Not exercising enough (sedentary lifestyle).  Having a substance in the blood called C-reactive protein (CRP). This is a sign of increased levels of inflammation in the body.  Sleep apnea.  Being stressed.  Drinking too much alcohol. What are the signs or symptoms? This condition may not cause any symptoms. If you have symptoms, they are caused by damage to an area of your body that is not getting enough blood.  Coronary artery disease may cause  chest pain and shortness of breath.  Decreased blood supply to your brain may cause a stroke. Signs of a stroke may include sudden: ? Weakness on one side of the body. ? Confusion. ? Changes in vision. ? Inability to speak or understand speech. ? Loss of balance, coordination, or the ability to walk. ? Severe headache. ? Loss of consciousness.  Peripheral arterial disease may cause pain and numbness, often in the legs and hips.  Renal failure may cause fatigue, nausea, swelling, and itchy skin. How is this diagnosed? This condition is diagnosed based on your medical history and a physical exam. During the exam:  Your health care provider will: ? Check your pulse in different places. ? Listen for a "whooshing" sound over your arteries (bruit).  You may have tests, such as: ? Blood tests to check your levels of cholesterol, triglycerides, and CRP. ? Electrocardiogram (ECG) to check for heart damage. ? Chest X-ray to see if you have an enlarged heart, which is a sign of heart failure. ? Stress test to see how your heart reacts to exercise. ? Echocardiogram to get images of the inside of your heart. ? Ankle-brachial index to compare blood pressure in your arms to blood pressure in your ankles. ? Ultrasound of your peripheral arteries to check blood flow. ? CT scan to check for damage to your heart or brain. ? X-rays of blood vessels after dye has been injected (angiogram) to check blood flow. How is this treated? Treatment starts with lifestyle changes, which may include:  Changing your diet.  Losing weight.  Reducing stress.  Exercising and being physically active more regularly.  Not smoking. You may also need medicine to:  Lower   triglycerides and cholesterol.  Control blood pressure.  Prevent blood clots.  Lower inflammation in your body.  Control your blood sugar. Sometimes, surgery is needed to:  Remove plaque from an artery (endarterectomy).  Open or widen  a narrowed heart artery (angioplasty).  Create a new path for your blood with one of these procedures: ? Heart (coronary) artery bypass graft surgery. ? Peripheral artery bypass graft surgery. Follow these instructions at home: Eating and drinking   Eat a heart-healthy diet. Talk with your health care provider or a diet and nutrition specialist (dietitian) if you need help. A heart-healthy diet involves: ? Limiting unhealthy fats and increasing healthy fats. Some examples of healthy fats are olive oil and canola oil. ? Eating plant-based foods, such as fruits, vegetables, nuts, whole grains, and legumes (such as peas and lentils).  Limit alcohol intake to no more than 1 drink a day for nonpregnant women and 2 drinks a day for men. One drink equals 12 oz of beer, 5 oz of wine, or 1 oz of hard liquor. Lifestyle  Follow an exercise program as told by your health care provider.  Maintain a healthy weight. Lose weight if your health care provider says that you need to do that.  Rest when you are tired.  Learn to manage your stress.  Do not use any products that contain nicotine or tobacco, such as cigarettes and e-cigarettes. If you need help quitting, ask your health care provider.  Do not abuse drugs. General instructions  Take over-the-counter and prescription medicines only as told by your health care provider.  Manage other health conditions as told by your health care provider.  Keep all follow-up visits as told by your health care provider. This is important. Contact a health care provider if:  You have chest pain or discomfort. This includes squeezing chest pain that may feel like indigestion (angina).  You have shortness of breath.  You have an irregular heartbeat.  You have unexplained fatigue.  You have unexplained pain or numbness in an arm, leg, or hip.  You have nausea, swelling of your hands or feet, and itchy skin. Get help right away if:  You have any  symptoms of a heart attack, such as: ? Chest pain. ? Shortness of breath. ? Pain in your neck, jaw, arms, back, or stomach. ? Cold sweat. ? Nausea. ? Light-headedness.  You have any symptoms of a stroke. "BE FAST" is an easy way to remember the main warning signs of a stroke: ? B - Balance. Signs are dizziness, sudden trouble walking, or loss of balance. ? E - Eyes. Signs are trouble seeing or a sudden change in vision. ? F - Face. Signs are sudden weakness or numbness of the face, or the face or eyelid drooping on one side. ? A - Arms. Signs are weakness or numbness in an arm. This happens suddenly and usually on one side of the body. ? S - Speech. Signs are sudden trouble speaking, slurred speech, or trouble understanding what people say. ? T - Time. Time to call emergency services. Write down what time symptoms started.  You have other signs of a stroke, such as: ? A sudden, severe headache with no known cause. ? Nausea or vomiting. ? Seizure. These symptoms may represent a serious problem that is an emergency. Do not wait to see if the symptoms will go away. Get medical help right away. Call your local emergency services (911 in the [place] Do not drive yourself   to the hospital. Summary  Atherosclerosis is narrowing and hardening of the arteries.  Arteries can become narrow or clogged with a buildup of fat, cholesterol, calcium, and other substances (plaque).  This condition may not cause any symptoms. If you do have symptoms, they are caused by damage to an area of your body that is not getting enough blood.  Treatment may include lifestyle changes and medicines. In some cases, surgery is needed. This information is not intended to replace advice given to you by your health care provider. Make sure you discuss any questions you have with your health care provider. Document Released: 03/21/2003 Document Revised: 04/09/2017 Document Reviewed: 09/03/2016 Elsevier Interactive Patient  Education  2019 [employer].   Follow-up with Gyn Onc in 11/2018 Follow-up with Urology Follow-up with PCP reg atherosclerosis

## 2018-05-30 ENCOUNTER — Other Ambulatory Visit: Payer: Self-pay | Admitting: Internal Medicine

## 2018-06-02 ENCOUNTER — Ambulatory Visit: Payer: BLUE CROSS/BLUE SHIELD

## 2018-06-15 ENCOUNTER — Other Ambulatory Visit: Payer: Self-pay

## 2018-06-15 ENCOUNTER — Ambulatory Visit (HOSPITAL_COMMUNITY)
Admission: RE | Admit: 2018-06-15 | Discharge: 2018-06-15 | Disposition: A | Discharge status: Home / Self Care | Payer: BC Managed Care – PPO | Source: Ambulatory Visit | Attending: Internal Medicine | Admitting: Internal Medicine

## 2018-06-15 DIAGNOSIS — R1010 Upper abdominal pain, unspecified: Secondary | ICD-10-CM | POA: Insufficient documentation

## 2018-06-15 DIAGNOSIS — K5790 Diverticulosis of intestine, part unspecified, without perforation or abscess without bleeding: Secondary | ICD-10-CM | POA: Diagnosis not present

## 2018-06-15 DIAGNOSIS — K55059 Acute (reversible) ischemia of intestine, part and extent unspecified: Secondary | ICD-10-CM | POA: Diagnosis not present

## 2018-06-15 LAB — POCT I-STAT CREATININE: Creatinine, Ser: 0.7 mg/dL (ref 0.44–1.00)

## 2018-06-15 MED ORDER — IOHEXOL 350 MG/ML SOLN
100.0000 mL | Freq: Once | INTRAVENOUS | Status: AC | PRN
  Administered 2018-06-15: 100 mL via INTRAVENOUS

## 2018-06-16 ENCOUNTER — Telehealth: Payer: Self-pay | Admitting: Internal Medicine

## 2018-06-16 NOTE — Telephone Encounter (Signed)
Pt calling checking on results; [phone]

## 2018-06-17 DIAGNOSIS — N200 Calculus of kidney: Secondary | ICD-10-CM | POA: Diagnosis not present

## 2018-06-17 DIAGNOSIS — C642 Malignant neoplasm of left kidney, except renal pelvis: Secondary | ICD-10-CM | POA: Diagnosis not present

## 2018-06-17 NOTE — Telephone Encounter (Signed)
Contacted patient, she was requesting results of CT scan. Discussed that there was no findings that explained her abdominal pain and she was relieved. She was concerned about the atherosclerosis, discussed that it will  be important to modify any risk factors and have good control of her HTN and HLD.  She reported that she wanted information about diet and exercise, we discussed some options. She had met with Butch Penny before and stated that she was interested in meeting with her again. All her questions were answered

## 2018-07-20 ENCOUNTER — Other Ambulatory Visit: Payer: Self-pay | Admitting: Internal Medicine

## 2018-07-20 DIAGNOSIS — Z1231 Encounter for screening mammogram for malignant neoplasm of breast: Secondary | ICD-10-CM

## 2018-07-26 NOTE — Progress Notes (Signed)
CC: Hypertension, hypothyroidism, right neck pain  HPI:  Lydia Walsh is a 63 y.o.  with a PMH listed below presenting for hypertension, hypothyroidism, and right neck pain. She reports that she has been having some neck pain for the past 2-3 weeks, the pain comes and goes, is more on the right side of her neck, and it is improved with ibuprofen. She states that she has had a similar episode before about a year ago that had resolved with no issues. She denied any pain with swallowing, difficulty swallowing, shortness of breath, fevers, chills, nausea, vomiting, or any other symptoms. She has not had any sick contacts or recent travel.   Please see A&P for status of the patient's chronic medical conditions  Past Medical History:  Diagnosis Date  . Anemia   . Cancer [facility])    endometrial cancer  . Endometrial polyp   . GERD (gastroesophageal reflux disease)   . Heart murmur   . Heart palpitations   . History of blood transfusion   . History of chemotherapy   . History of radiation therapy   . History of uterine fibroid   . Hyperlipidemia   . Hypertension   . Hypothyroidism   . OSA (obstructive sleep apnea)    moderate OSA per study 06-21-2005--  per pt does need cpap anymore never purchased it  . Osteoarthritis   . PMB (postmenopausal bleeding)   . Wears dentures    upper  . Wears glasses    Review of Systems: Refer to history of present illness and assessment and plans for pertinent review of systems, all others reviewed and negative.  Physical Exam:  Vitals:   07/27/18 1413 07/27/18 1456  BP: 138/70 (!) 139/97  Pulse: 84 82  Temp: 99.6 F (37.6 C)   TempSrc: Oral   SpO2: 100%   Weight: 188 lb 3.2 oz (85.4 kg)   Height: 5' (1.524 m)     Physical Exam  Constitutional: She is oriented to person, place, and time and well-developed, well-nourished, and in no distress.  HENT:  Head: Normocephalic and atraumatic.  Mouth/Throat: No oropharyngeal exudate.  Neck:  Normal range of motion. Neck supple. No tracheal deviation present. No thyromegaly present.  TTP in middle on right neck area, no cervical adenopathy, no enlarged thyroid or nodules noted  Cardiovascular: Normal rate, regular rhythm and normal heart sounds.  Pulmonary/Chest: Effort normal and breath sounds normal. No respiratory distress.  Abdominal: Soft. Bowel sounds are normal. She exhibits no distension.  Lymphadenopathy:    She has no cervical adenopathy.  Neurological: She is alert and oriented to person, place, and time.  Skin: Skin is warm and dry.  Psychiatric: Mood and affect normal.    Social History   Socioeconomic History  . Marital status: Widowed    Spouse name: Not on file  . Number of children: 3  . Years of education: Not on file  . Highest education level: Not on file  Occupational History  . Occupation: Production manager, [employer]  Social Needs  . Financial resource strain: Not on file  . Food insecurity    Worry: Not on file    Inability: Not on file  . Transportation needs    Medical: Not on file    Non-medical: Not on file  Tobacco Use  . Smoking status: Former Smoker    Years: 2.00    Types: Cigarettes    Quit date: 02/28/1994    Years since quitting: 24.4  .  Smokeless tobacco: Never Used  Substance and Sexual Activity  . Alcohol use: No    Alcohol/week: 0.0 standard drinks  . Drug use: No  . Sexual activity: Not on file  Lifestyle  . Physical activity    Days per week: Not on file    Minutes per session: Not on file  . Stress: Not on file  Relationships  . Social Herbalist on phone: Not on file    Gets together: Not on file    Attends religious service: Not on file    Active member of club or organization: Not on file    Attends meetings of clubs or organizations: Not on file    Relationship status: Not on file  . Intimate partner violence    Fear of current or ex partner: Not on file    Emotionally abused: Not on file     Physically abused: Not on file    Forced sexual activity: Not on file  Other Topics Concern  . Not on file  Social History Narrative   Widowed she has 3 sons one died from heart issues.   She is employed with security at [employer]   No alcohol tobacco or drug use    Family History  Problem Relation Age of Onset  . Hypertension Mother   . Cirrhosis Father        alcoholic  . Heart disease Son   . Colon cancer Neg Hx   . Stomach cancer Neg Hx   . Rectal cancer Neg Hx   . Esophageal cancer Neg Hx   . Liver cancer Neg Hx     Assessment & Plan:   See Encounters Tab for problem based charting.  Patient discussed with Dr. Rebeca Alert

## 2018-07-27 ENCOUNTER — Ambulatory Visit (INDEPENDENT_AMBULATORY_CARE_PROVIDER_SITE_OTHER): Payer: BC Managed Care – PPO | Admitting: Internal Medicine

## 2018-07-27 ENCOUNTER — Other Ambulatory Visit: Payer: Self-pay

## 2018-07-27 ENCOUNTER — Encounter: Payer: Self-pay | Admitting: Internal Medicine

## 2018-07-27 VITALS — BP 139/97 | HR 82 | Temp 99.6°F | Ht 60.0 in | Wt 188.2 lb

## 2018-07-27 DIAGNOSIS — J029 Acute pharyngitis, unspecified: Secondary | ICD-10-CM

## 2018-07-27 DIAGNOSIS — E039 Hypothyroidism, unspecified: Secondary | ICD-10-CM | POA: Diagnosis not present

## 2018-07-27 DIAGNOSIS — Z79890 Hormone replacement therapy (postmenopausal): Secondary | ICD-10-CM

## 2018-07-27 DIAGNOSIS — M542 Cervicalgia: Secondary | ICD-10-CM

## 2018-07-27 DIAGNOSIS — I10 Essential (primary) hypertension: Secondary | ICD-10-CM | POA: Diagnosis not present

## 2018-07-27 DIAGNOSIS — N2889 Other specified disorders of kidney and ureter: Secondary | ICD-10-CM

## 2018-07-27 DIAGNOSIS — Z79899 Other long term (current) drug therapy: Secondary | ICD-10-CM

## 2018-07-27 DIAGNOSIS — Z87891 Personal history of nicotine dependence: Secondary | ICD-10-CM

## 2018-07-27 MED ORDER — METOPROLOL SUCCINATE ER 100 MG PO TB24: 100.0000 mg | ORAL_TABLET | Freq: Every day | ORAL | 0 refills | Status: DC

## 2018-07-27 NOTE — Patient Instructions (Signed)
[name],  It was a pleasure to see you today. Thank you for coming in.   Today we discussed your blood pressure and your sore throat.   Your blood pressure was a little high today, please increase your metoprolol to 100 mg daily and continue taking the spironolactone.  In regards to your sore throat, this could just be related to a viral infection, please continue taking over the counter ibuprofen for your symptoms for now. However if your symptoms do not improve within the next 2 weeks please contact us to be evaluated. We may need to obtain some labs at that time.   Please return to clinic in 3 months or sooner if needed.   Thank you again for coming in.   Asencion Noble.D.

## 2018-07-28 DIAGNOSIS — M542 Cervicalgia: Secondary | ICD-10-CM | POA: Insufficient documentation

## 2018-07-28 NOTE — Assessment & Plan Note (Signed)
Patient is on metoprolol 50 mg daily and spironolactone 25 mg daily. She denies any issues taking her medications. Denies any light headedness, dizziness, chest pain, SOB, or headaches. BP today is 138/70, repeat is 139/97.   -Increase metoprolol to 100 mg daily -Continue spironolactone 50 daily

## 2018-07-28 NOTE — Assessment & Plan Note (Signed)
Patient saw Dr. Tresa Moore, Urologist, on 06/17/18. Renal mass has been stable and they will follow up in 1 year.

## 2018-07-28 NOTE — Assessment & Plan Note (Addendum)
She reports that she has been having some throat pain for the past 2-3 weeks, the pain comes and goes, is more on the right side of her neck, and it is improved with ibuprofen. She states that she has had a similar episode before about a year ago that had resolved with no issues. She denied any pain with swallowing, difficulty swallowing, shortness of breath, fevers, chills, nausea, vomiting, or any other symptoms. She has not had any sick contacts or recent travel.  On exam she has some tenderness on the right side of the middle of her neck, above the area of the thyroid. No nodules appreciated. This could just be related to a small tender lymph node however she does not report any evidence of infections. We discussed that given her history of previous episode that had resolved we can continue to monitor for now and that if her symptoms don't resolve within the next 2 weeks to RTC to be re-evaluated and we will likely obtain imaging at that time.   -Advised patient to monitor for now and continue ibuprofen, if symptoms don't improve or resolve within 1-2 weeks to RTC -Will obtain CBC and likely an neck ultrasound at that time

## 2018-07-28 NOTE — Assessment & Plan Note (Signed)
On Synthroid 125 mcg daily, TSH on 11/19 was 0.673.  Today she reports that she has had an increase in her appetite and that she has gained about 10 lbs. She denied any issues with heat or cold intolerance, dry skin, hair loss, palpitations, chest pain, or excessive sweating. She does report some throat pain however on exam the area of pain does not appear to be around her thyroid so this seems unrelated. Discussed checking TSH with patient and she reported that she would rather wait until the next visit.  -continue synthroid 125 mcg daily -Repeat TSH on next visit

## 2018-07-29 NOTE — Progress Notes (Signed)
Internal Medicine Clinic Attending  Case discussed with Dr. Krienke at the time of the visit.  We reviewed the resident's history and exam and pertinent patient test results.  I agree with the assessment, diagnosis, and plan of care documented in the resident's note.  Alexander Raines, M.D., Ph.D.  

## 2018-08-28 ENCOUNTER — Other Ambulatory Visit: Payer: Self-pay | Admitting: Internal Medicine

## 2018-08-31 ENCOUNTER — Other Ambulatory Visit: Payer: Self-pay | Admitting: Internal Medicine

## 2018-08-31 DIAGNOSIS — I10 Essential (primary) hypertension: Secondary | ICD-10-CM

## 2018-08-31 NOTE — Telephone Encounter (Signed)
Questions about meds, please call pt back. 

## 2018-09-01 ENCOUNTER — Other Ambulatory Visit: Payer: Self-pay

## 2018-09-01 ENCOUNTER — Ambulatory Visit
Admission: RE | Admit: 2018-09-01 | Discharge: 2018-09-01 | Disposition: A | Discharge status: Home / Self Care | Payer: BC Managed Care – PPO | Source: Ambulatory Visit

## 2018-09-01 DIAGNOSIS — Z1231 Encounter for screening mammogram for malignant neoplasm of breast: Secondary | ICD-10-CM

## 2018-09-02 NOTE — Telephone Encounter (Signed)
Patient called in, states Metoprolol XL 100 mg is too large and makes her choke. She has been taking 2 (50 mg) tablets instead and would like a refill on this. Hubbard Hartshorn, RN, BSN

## 2018-09-05 ENCOUNTER — Other Ambulatory Visit: Payer: Self-pay

## 2018-09-05 ENCOUNTER — Ambulatory Visit
Admission: RE | Admit: 2018-09-05 | Discharge: 2018-09-05 | Disposition: A | Discharge status: Home / Self Care | Payer: Medicare Other | Source: Ambulatory Visit | Attending: Radiation Oncology | Admitting: Radiation Oncology

## 2018-09-05 ENCOUNTER — Encounter: Payer: Self-pay | Admitting: Radiation Oncology

## 2018-09-05 VITALS — BP 164/71 | HR 68 | Temp 98.7°F | Resp 18 | Ht 60.0 in | Wt 186.0 lb

## 2018-09-05 DIAGNOSIS — C541 Malignant neoplasm of endometrium: Secondary | ICD-10-CM

## 2018-09-05 DIAGNOSIS — Z923 Personal history of irradiation: Secondary | ICD-10-CM | POA: Diagnosis not present

## 2018-09-05 DIAGNOSIS — Z8542 Personal history of malignant neoplasm of other parts of uterus: Secondary | ICD-10-CM | POA: Insufficient documentation

## 2018-09-05 DIAGNOSIS — Z79899 Other long term (current) drug therapy: Secondary | ICD-10-CM | POA: Insufficient documentation

## 2018-09-05 NOTE — Progress Notes (Signed)
Pt presents today for f/u with Dr. Sondra Come. Pt denies c/o pain. Pt denies dysuria/hematuria. Pt denies vaginal bleeding/discharge. Pt denies rectal bleeding, diarrhea/constipation. Pt denies abdominal bloating, N/V.   BP (!) 164/71 (BP Location: Left Arm, Patient Position: Sitting)   Pulse 68   Temp 98.7 F (37.1 C) (Temporal)   Resp 18   Ht 5' (1.524 m)   Wt 186 lb (84.4 kg)   LMP 07/16/2014   SpO2 100%   BMI 36.33 kg/m   Wt Readings from Last 3 Encounters:  09/05/18 186 lb (84.4 kg)  07/27/18 188 lb 3.2 oz (85.4 kg)  03/17/18 188 lb (85.3 kg)   Loma Sousa, RN BSN

## 2018-09-05 NOTE — Patient Instructions (Signed)
Coronavirus (COVID-19) Are you at risk?  Are you at risk for the Coronavirus (COVID-19)?  To be considered HIGH RISK for Coronavirus (COVID-19), you have to meet the following criteria:  . Traveled to [place], [place], [place], [place] or [place]; or in the [place] to [place], [place], [place], or [place]; and have fever, cough, and shortness of breath within the last 2 weeks of travel OR . Been in close contact with a person diagnosed with COVID-19 within the last 2 weeks and have fever, cough, and shortness of breath . IF YOU DO NOT MEET THESE CRITERIA, YOU ARE CONSIDERED LOW RISK FOR COVID-19.  What to do if you are HIGH RISK for COVID-19?  . If you are having a medical emergency, call 911. . Seek medical care right away. Before you go to a doctor's office, urgent care or emergency department, call ahead and tell them about your recent travel, contact with someone diagnosed with COVID-19, and your symptoms. You should receive instructions from your physician's office regarding next steps of care.  . When you arrive at healthcare provider, tell the healthcare staff immediately you have returned from visiting [place], [place], [place], [place] or [place]; or traveled in the [place] to [place], [place], [place], or [place]; in the last two weeks or you have been in close contact with a person diagnosed with COVID-19 in the last 2 weeks.   . Tell the health care staff about your symptoms: fever, cough and shortness of breath. . After you have been seen by a medical provider, you will be either: o Tested for (COVID-19) and discharged home on quarantine except to seek medical care if symptoms worsen, and asked to  - Stay home and avoid contact with others until you get your results (4-5 days)  - Avoid travel on public transportation if possible (such as bus, train, or airplane) or o Sent to the Emergency Department by EMS for evaluation, COVID-19 testing, and possible  admission depending on your condition and test results.  What to do if you are LOW RISK for COVID-19?  Reduce your risk of any infection by using the same precautions used for avoiding the common cold or flu:  . Wash your hands often with soap and warm water for at least 20 seconds.  If soap and water are not readily available, use an alcohol-based hand sanitizer with at least 60% alcohol.  . If coughing or sneezing, cover your mouth and nose by coughing or sneezing into the elbow areas of your shirt or coat, into a tissue or into your sleeve (not your hands). . Avoid shaking hands with others and consider head nods or verbal greetings only. . Avoid touching your eyes, nose, or mouth with unwashed hands.  . Avoid close contact with people who are sick. . Avoid places or events with large numbers of people in one location, like concerts or sporting events. . Carefully consider travel plans you have or are making. . If you are planning any travel outside or inside the [place], visit the CDC's Travelers' Health webpage for the latest health notices. . If you have some symptoms but not all symptoms, continue to monitor at home and seek medical attention if your symptoms worsen. . If you are having a medical emergency, call 911.   ADDITIONAL HEALTHCARE OPTIONS FOR PATIENTS  [facility] / e-Visit: [email or web address]         [facility]: [phone]  [place]   [facility]: [phone]                   [facility]: [phone]   

## 2018-09-05 NOTE — Progress Notes (Signed)
Radiation Oncology         [phone] ________________________________  Name: Lydia Walsh MRN: [id number]  Date: 09/05/2018  DOB: 1955-09-18    Follow-Up Visit Note  CC: Asencion Noble, MD  Gordy Levan, MD    ICD-10-CM   1. International Federation 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 ([place])  C54.1     Diagnosis: Stage IIIC-1 grade 2 endometrioid adenocarcinoma    Interval Since Last Radiation:  2 years, 10 months  1) 09/02/15-10/08/15: External beam radiation to the pelvis - 45 Gy in 25 fractions, simultaneous integrated boost to suspicious node 50 Gy  2) 10/15/15, 10/22/15, 10/24/15 - HDR vaginal brachytherapy - 18 Gy in 3 fractions.  Narrative:  The patient returns today for routine follow-up. She last saw Dr. Skeet Latch on 05/26/2018 with no evidence of recurrence.  Since her last visit, she underwent abdominal CT scans, on 03/31/2018 and 06/15/2018, for increased bilateral upper quadrant pain. Fortunately, both scans were negative.  She also screening mammogram on 09/01/2018, which was negative for malignancy.  On review of systems, she is without complaints today. She specifically denies dysuria or hematuria, vaginal bleeding or discharge, rectal bleeding, diarrhea or constipation, abdominal bloating, and nausea or vomiting.     ALLERGIES:  is allergic to hctz [hydrochlorothiazide].  Meds: Current Outpatient Medications  Medication Sig Dispense Refill  . allopurinol (ZYLOPRIM) 100 MG tablet Take 1 tablet by mouth once daily 90 tablet 1  . ibuprofen (ADVIL,MOTRIN) 200 MG tablet Take 200 mg by mouth as needed.    Marland Kitchen levothyroxine (SYNTHROID, LEVOTHROID) 125 MCG tablet Take 1 tablet by mouth once daily 90 tablet 2  . metoprolol succinate (TOPROL-XL) 50 MG 24 hr tablet Take 2 tablets (100 mg total) by mouth daily. 60 tablet 1  . omeprazole (PRILOSEC) 20 MG capsule TAKE 1 CAPSULE BY MOUTH ONCE DAILY 30 capsule 11  .  OVER THE COUNTER MEDICATION Alive Women's 50+ Gummie Multivitamin    . potassium chloride (K-DUR) 10 MEQ tablet TAKE 1 TABLET BY MOUTH ONCE DAILY 90 tablet 3  . simvastatin (ZOCOR) 20 MG tablet TAKE 1 TABLET BY MOUTH EVERY DAY AT BEDTIME 30 tablet 11  . spironolactone (ALDACTONE) 25 MG tablet Take 2 tablets (50 mg total) by mouth daily. D/c previous spironolactone prescription 180 tablet 3  . diclofenac sodium (VOLTAREN) 1 % GEL APPLY 2 GRAMS TOPICALLY 4 TIMES DAILY (Patient not taking: Reported on 03/07/2018) 100 g 1  . fluticasone (FLONASE) 50 MCG/ACT nasal spray Spray 1-2 sprays per nostril per day as needed for treatment of allergies. 16 g 5  . Multiple Vitamins-Minerals (CENTRUM PO) Take by mouth.     No current facility-administered medications for this encounter.     Physical Findings: The patient is in no acute distress. Patient is alert and oriented.  height is 5' (1.524 m) and weight is 186 lb (84.4 kg). Her temporal temperature is 98.7 F (37.1 C). Her blood pressure is 164/71 (abnormal) and her pulse is 68. Her respiration is 18 and oxygen saturation is 100%.   Lungs are clear to auscultation bilaterally. Heart has regular rate and rhythm. No palpable cervical, supraclavicular, or axillary adenopathy. Abdomen soft, non-tender, normal bowel sounds.   On pelvic examination the external genitalia were unremarkable. A speculum exam was performed.  A small speculum was used for the examination.  The vaginal vault was noted to be shortened. There are no mucosal lesions noted in the vaginal vault. On  bimanual and rectovaginal examination there were no pelvic masses appreciated.  Vaginal cuff intact.   Lab Findings: Lab Results  Component Value Date   WBC 6.1 03/17/2018   HGB 12.5 03/17/2018   HCT 40.3 03/17/2018   MCV 91.4 03/17/2018   PLT 198 03/17/2018    Radiographic Findings: Mm 3d Screen Breast Bilateral  Result Date: 09/02/2018 CLINICAL DATA:  Screening. EXAM: DIGITAL  SCREENING BILATERAL MAMMOGRAM WITH TOMO AND CAD COMPARISON:  Previous exam(s). ACR Breast Density Category b: There are scattered areas of fibroglandular density. FINDINGS: There are no findings suspicious for malignancy. Images were processed with CAD. IMPRESSION: No mammographic evidence of malignancy. A result letter of this screening mammogram will be mailed directly to the patient. RECOMMENDATION: Screening mammogram in one year. (Code:[id number]-B-01Y) BI-RADS CATEGORY  1: Negative. Electronically Signed   By: Audie Pinto M.D.   On: 09/02/2018 08:24    Impression:  No evidence of recurrence on clinical exam.   Plan: Patient will see Dr. Skeet Latch in November 2020. She will follow-up in radiation oncology on 03/09/2019.   -----------------------------------  Blair Promise, PhD, MD  This document serves as a record of services personally performed by Gery Pray, MD. It was created on his behalf by Wilburn Mylar, a trained medical scribe. The creation of this record is based on the scribe's personal observations and the provider's statements to them. This document has been checked and approved by the attending provider.

## 2018-09-09 ENCOUNTER — Ambulatory Visit (INDEPENDENT_AMBULATORY_CARE_PROVIDER_SITE_OTHER): Payer: Medicare Other | Admitting: Cardiovascular Disease

## 2018-09-09 ENCOUNTER — Encounter: Payer: Self-pay | Admitting: Cardiovascular Disease

## 2018-09-09 ENCOUNTER — Other Ambulatory Visit: Payer: Self-pay

## 2018-09-09 DIAGNOSIS — G4733 Obstructive sleep apnea (adult) (pediatric): Secondary | ICD-10-CM | POA: Diagnosis not present

## 2018-09-09 DIAGNOSIS — I2510 Atherosclerotic heart disease of native coronary artery without angina pectoris: Secondary | ICD-10-CM | POA: Insufficient documentation

## 2018-09-09 DIAGNOSIS — I10 Essential (primary) hypertension: Secondary | ICD-10-CM

## 2018-09-09 DIAGNOSIS — E785 Hyperlipidemia, unspecified: Secondary | ICD-10-CM | POA: Diagnosis not present

## 2018-09-09 DIAGNOSIS — R931 Abnormal findings on diagnostic imaging of heart and coronary circulation: Secondary | ICD-10-CM | POA: Insufficient documentation

## 2018-09-09 NOTE — Assessment & Plan Note (Signed)
History of dyslipidemia on statin therapy.  We will recheck a lipid liver profile since his been 3 years

## 2018-09-09 NOTE — Assessment & Plan Note (Signed)
History of obstructive sleep apnea not on CPAP. 

## 2018-09-09 NOTE — Progress Notes (Signed)
09/09/2018 Lydia Walsh   September 18, 1955  [id number]  Primary Physician Asencion Noble, MD Primary Cardiologist: Lorretta Harp MD [name], [place]  HPI:  Lydia Walsh is a 63 y.o.  moderately overweight divorced African American female mother of 3 children and is [place] from [place]. She was referred by her PCP for evaluation of palpitations.  I last saw her in the office 05/01/2016.  She did see Rosaria Ferries, PA-C in the office 6 months later.  The left side her in the office -9/18. She's been evaluated for this in the past several years ago. She also has recent diagnosis of endometrial cancer in June of this year. She has no other cardiac risk factors. She does not drink caffeine.I ordered a 2-D echocardiogram which was entirely normal and the monitor that showed only sinus rhythm. Her symptoms palpitations have spontaneously resolved. Several months ago her palpitations have occurred in the last 2 weeks. She did remind me that she has an indwelling Port-A-Cath which may be contributory.  Since I saw her over 2 years ago she is remained stable.  She gets occasional palpitations.  She denies chest pain or shortness of breath.  She did have a CT scan performed 03/17/2018 that had incidental finding of coronary calcification although she is completely asymptomatic.   Current Meds  Medication Sig  . allopurinol (ZYLOPRIM) 100 MG tablet Take 1 tablet by mouth once daily  . diclofenac sodium (VOLTAREN) 1 % GEL APPLY 2 GRAMS TOPICALLY 4 TIMES DAILY  . fluticasone (FLONASE) 50 MCG/ACT nasal spray Spray 1-2 sprays per nostril per day as needed for treatment of allergies.  Marland Kitchen ibuprofen (ADVIL,MOTRIN) 200 MG tablet Take 200 mg by mouth as needed.  Marland Kitchen levothyroxine (SYNTHROID, LEVOTHROID) 125 MCG tablet Take 1 tablet by mouth once daily  . metoprolol succinate (TOPROL-XL) 50 MG 24 hr tablet Take 2 tablets (100 mg total) by mouth daily.  . Multiple Vitamins-Minerals (CENTRUM  PO) Take by mouth.  Marland Kitchen omeprazole (PRILOSEC) 20 MG capsule TAKE 1 CAPSULE BY MOUTH ONCE DAILY  . OVER THE COUNTER MEDICATION Alive Women's 50+ Gummie Multivitamin  . potassium chloride (K-DUR) 10 MEQ tablet TAKE 1 TABLET BY MOUTH ONCE DAILY  . simvastatin (ZOCOR) 20 MG tablet TAKE 1 TABLET BY MOUTH EVERY DAY AT BEDTIME  . spironolactone (ALDACTONE) 25 MG tablet Take 2 tablets (50 mg total) by mouth daily. D/c previous spironolactone prescription     Allergies  Allergen Reactions  . Hctz [Hydrochlorothiazide] Rash    Social History   Socioeconomic History  . Marital status: Widowed    Spouse name: Not on file  . Number of children: 3  . Years of education: Not on file  . Highest education level: Not on file  Occupational History  . Occupation: Production manager, [employer]  Social Needs  . Financial resource strain: Not on file  . Food insecurity    Worry: Not on file    Inability: Not on file  . Transportation needs    Medical: Not on file    Non-medical: Not on file  Tobacco Use  . Smoking status: Former Smoker    Years: 2.00    Types: Cigarettes    Quit date: 02/28/1994    Years since quitting: 24.5  . Smokeless tobacco: Never Used  Substance and Sexual Activity  . Alcohol use: No    Alcohol/week: 0.0 standard drinks  . Drug use: No  . Sexual activity:  Not on file  Lifestyle  . Physical activity    Days per week: Not on file    Minutes per session: Not on file  . Stress: Not on file  Relationships  . Social Herbalist on phone: Not on file    Gets together: Not on file    Attends religious service: Not on file    Active member of club or organization: Not on file    Attends meetings of clubs or organizations: Not on file    Relationship status: Not on file  . Intimate partner violence    Fear of current or ex partner: Not on file    Emotionally abused: Not on file    Physically abused: Not on file    Forced sexual activity: Not on file  Other  Topics Concern  . Not on file  Social History Narrative   Widowed she has 3 sons one died from heart issues.   She is employed with security at [employer]   No alcohol tobacco or drug use     Review of Systems: General: negative for chills, fever, night sweats or weight changes.  Cardiovascular: negative for chest pain, dyspnea on exertion, edema, orthopnea, palpitations, paroxysmal nocturnal dyspnea or shortness of breath Dermatological: negative for rash Respiratory: negative for cough or wheezing Urologic: negative for hematuria Abdominal: negative for nausea, vomiting, diarrhea, bright red blood per rectum, melena, or hematemesis Neurologic: negative for visual changes, syncope, or dizziness All other systems reviewed and are otherwise negative except as noted above.    Blood pressure 124/72, pulse 68, temperature (!) 97.4 F (36.3 C), height 5' (1.524 m), weight 186 lb (84.4 kg), last menstrual period 07/16/2014.  General appearance: alert and no distress Neck: no adenopathy, no carotid bruit, no JVD, supple, symmetrical, trachea midline and thyroid not enlarged, symmetric, no tenderness/mass/nodules Lungs: clear to auscultation bilaterally Heart: regular rate and rhythm, S1, S2 normal, no murmur, click, rub or gallop Extremities: extremities normal, atraumatic, no cyanosis or edema Pulses: 2+ and symmetric Skin: Skin color, texture, turgor normal. No rashes or lesions Neurologic: Alert and oriented X 3, normal strength and tone. Normal symmetric reflexes. Normal coordination and gait  EKG sinus rhythm at 68 with left ventricular hypertrophy.  I personally reviewed this EKG.  ASSESSMENT AND PLAN:   Essential hypertension History of essential hypertension with blood pressure measured today at 124/72.  She is on metoprolol and Spironolactone.  Dyslipidemia History of dyslipidemia on statin therapy.  We will recheck a lipid liver profile since his been 3 years   OBSTRUCTIVE SLEEP APNEA History of obstructive sleep apnea not on CPAP  Palpitations History of palpitations with a normal event monitor and 2D echo.  These only occur occasionally.  Coronary artery calcification seen on CT scan Recent CT scan performed 03/17/2018 revealed an incidental finding of coronary calcification.  Patient is completely asymptomatic.  At this point, I do not feel compelled to perform any other diagnostic test.  Should she have chest pain or other cardiac symptoms I would have a low threshold to perform noninvasive imaging to further evaluate.      Lorretta Harp MD FACP,FACC,FAHA, [facility] 09/09/2018 9:19 AM

## 2018-09-09 NOTE — Assessment & Plan Note (Signed)
Recent CT scan performed 03/17/2018 revealed an incidental finding of coronary calcification.  Patient is completely asymptomatic.  At this point, I do not feel compelled to perform any other diagnostic test.  Should she have chest pain or other cardiac symptoms I would have a low threshold to perform noninvasive imaging to further evaluate.

## 2018-09-09 NOTE — Patient Instructions (Addendum)
Medication Instructions:  Your physician recommends that you continue on your current medications as directed. Please refer to the Current Medication list given to you today.  If you need a refill on your cardiac medications before your next appointment, please call your pharmacy.   Lab work: Your physician recommends that you return for lab work WITHIN 1-2 WEEKS: LIPID AND LIVER PANELS  If you have labs (blood work) drawn today and your tests are completely normal, you will receive your results only by: Marland Kitchen MyChart Message (if you have MyChart) OR . A paper copy in the mail If you have any lab test that is abnormal or we need to change your treatment, we will call you to review the results.  Testing/Procedures: NONE  Follow-Up: At [facility], you and your health needs are our priority.  As part of our continuing mission to provide you with exceptional heart care, we have created designated Provider Care Teams.  These Care Teams include your primary Cardiologist (physician) and Advanced Practice Providers (APPs -  Physician Assistants and Nurse Practitioners) who all work together to provide you with the care you need, when you need it. . You will need a follow up appointment AS NEEDED. You may see Dr. Gwenlyn Found or one of the following Advanced Practice Providers on your designated Care Team:   . Kerin Ransom, PA-C . Daleen Snook Kroeger, PA-C . Sande Rives, PA-C . Almyra Deforest, PA-C . Fabian Sharp, PA-C . Jory Sims, DNP . Rosaria Ferries, PA-C

## 2018-09-09 NOTE — Assessment & Plan Note (Signed)
History of palpitations with a normal event monitor and 2D echo.  These only occur occasionally.

## 2018-09-09 NOTE — Assessment & Plan Note (Signed)
History of essential hypertension with blood pressure measured today at 124/72.  She is on metoprolol and Spironolactone.

## 2018-09-12 DIAGNOSIS — E785 Hyperlipidemia, unspecified: Secondary | ICD-10-CM | POA: Diagnosis not present

## 2018-09-12 LAB — HEPATIC FUNCTION PANEL
ALT: 22 IU/L (ref 0–32)
AST: 22 IU/L (ref 0–40)
Albumin: 4.6 g/dL (ref 3.8–4.8)
Alkaline Phosphatase: 106 IU/L (ref 39–117)
Bilirubin Total: 0.4 mg/dL (ref 0.0–1.2)
Bilirubin, Direct: 0.12 mg/dL (ref 0.00–0.40)
Total Protein: 7 g/dL (ref 6.0–8.5)

## 2018-09-12 LAB — LIPID PANEL
Chol/HDL Ratio: 3.5 ratio (ref 0.0–4.4)
Cholesterol, Total: 218 mg/dL — ABNORMAL HIGH (ref 100–199)
HDL: 62 mg/dL (ref >39)
LDL Chol Calc (NIH): 135 mg/dL — ABNORMAL HIGH (ref 0–99)
Triglycerides: 116 mg/dL (ref 0–149)
VLDL Cholesterol Cal: 21 mg/dL (ref 5–40)

## 2018-09-13 ENCOUNTER — Encounter: Payer: Self-pay | Admitting: *Deleted

## 2018-09-18 ENCOUNTER — Other Ambulatory Visit: Payer: Self-pay | Admitting: Internal Medicine

## 2018-09-18 DIAGNOSIS — E876 Hypokalemia: Secondary | ICD-10-CM

## 2018-09-20 ENCOUNTER — Other Ambulatory Visit: Payer: Self-pay | Admitting: Internal Medicine

## 2018-09-20 DIAGNOSIS — E876 Hypokalemia: Secondary | ICD-10-CM

## 2018-09-20 NOTE — Telephone Encounter (Signed)
Needs refill on potassium chloride (K-DUR) 10 MEQ tablet [place] ([place]), [place]   ;pt contact [phone]

## 2018-09-20 NOTE — Telephone Encounter (Signed)
Duplicate-refill request sent in another encounter-pending review by pcp.Despina Hidden Cassady9/8/20201:02 PM

## 2018-09-26 ENCOUNTER — Other Ambulatory Visit: Payer: Self-pay | Admitting: Internal Medicine

## 2018-09-26 NOTE — Telephone Encounter (Signed)
Refill Request  [id number] [place] ([place]), [place] - [place]

## 2018-09-26 NOTE — Telephone Encounter (Signed)
Refill Request  omeprazole (PRILOSEC) 20 MG capsule  [id number] - [place] ([place]), [place] - [place]

## 2018-09-28 MED ORDER — OMEPRAZOLE 20 MG PO CPDR: 20.0000 mg | DELAYED_RELEASE_CAPSULE | Freq: Every day | ORAL | 0 refills | Status: DC

## 2018-10-22 ENCOUNTER — Other Ambulatory Visit: Payer: Self-pay | Admitting: Internal Medicine

## 2018-10-24 NOTE — Telephone Encounter (Signed)
Appt sch with PCP 11/21/2018.

## 2018-11-03 ENCOUNTER — Telehealth: Payer: Self-pay | Admitting: Internal Medicine

## 2018-11-03 NOTE — Telephone Encounter (Signed)
Pt states her medicine does not look like it always has, she thinks the pharmacy is changing it, advised to speak with pharmacist and then call back if there is additional problems

## 2018-11-03 NOTE — Telephone Encounter (Signed)
Pls contact pt regarding side effects on medicine [phone]

## 2018-11-05 ENCOUNTER — Other Ambulatory Visit: Payer: Self-pay | Admitting: Internal Medicine

## 2018-11-05 DIAGNOSIS — I10 Essential (primary) hypertension: Secondary | ICD-10-CM

## 2018-11-21 ENCOUNTER — Encounter: Payer: Self-pay | Admitting: Internal Medicine

## 2018-11-21 ENCOUNTER — Other Ambulatory Visit: Payer: Self-pay

## 2018-11-21 ENCOUNTER — Ambulatory Visit (INDEPENDENT_AMBULATORY_CARE_PROVIDER_SITE_OTHER): Payer: Medicare Other | Admitting: Internal Medicine

## 2018-11-21 VITALS — BP 172/64 | HR 67 | Temp 99.5°F | Ht 60.0 in | Wt 193.2 lb

## 2018-11-21 DIAGNOSIS — E039 Hypothyroidism, unspecified: Secondary | ICD-10-CM

## 2018-11-21 DIAGNOSIS — K219 Gastro-esophageal reflux disease without esophagitis: Secondary | ICD-10-CM | POA: Diagnosis not present

## 2018-11-21 DIAGNOSIS — M542 Cervicalgia: Secondary | ICD-10-CM | POA: Diagnosis not present

## 2018-11-21 DIAGNOSIS — I10 Essential (primary) hypertension: Secondary | ICD-10-CM | POA: Diagnosis present

## 2018-11-21 MED ORDER — METOPROLOL SUCCINATE ER 50 MG PO TB24: 150.0000 mg | ORAL_TABLET | Freq: Every day | ORAL | 1 refills | Status: DC

## 2018-11-21 NOTE — Assessment & Plan Note (Signed)
She is on metoprolol 100 mg daily and spironolactone 50 mg daily. BP today is elevated at 162/71, repeat blood pressures are 186/75 and 172/64.  She reports that she took her blood pressure medication this morning, prior blood pressures have been within normal limits.  She also endorses that she ate some ham and eggs this morning which could be contributing.  Discussed increasing her blood pressure medications and she is in agreement.  -Continue spironolactone 50 mg daily -Increase metoprolol 250 mg daily -Return to clinic in 3 weeks to repeat blood pressure

## 2018-11-21 NOTE — Assessment & Plan Note (Signed)
Patient reports that she is continuing to have right neck pain, on her last visit she had taken the ibuprofen and her symptoms resolved for about a month however they then returned.  Her ibuprofen helps the symptoms but she continues to have had return.  She reports that it is worse in the evening. She denies any pain with swallowing, difficulty swallowing, cough, shortness of breath, fever, chills, or other symptoms.  On exam she has no erythema or edema noted in her posterior pharynx, mild cobblestoning noted, she has painful nodule on her right anterior neck area, unclear if on thyroid or small lymph nodes.  Given the recurrence of her symptoms will obtain thyroid ultrasound to evaluate and CBC to evaluate for any signs of infection. -Continue ibuprofen as needed -Obtain CBC -Thyroid ultrasound

## 2018-11-21 NOTE — Patient Instructions (Addendum)
Ms. ALEXCIS POSADAS,  It was a pleasure to see you today. Thank you for coming in.   Today we discussed your blood pressure. This was a little high today, please increase your metoprolol to 150 mg daily and continue the spironolactone. Please start checking your blood pressures at home and record this.   We also discussed your hypothyroidism. Please continue taking the synthroid as prescribed, we are checking your TSH and will call you if you need to adjust your medication dose.   We also discussed your throat pain, we are getting some labs and imaging of your thyroid. We will call you if these are abnormal. Please continue taking the ibuprofen as needed.   Please return to clinic in 3 weeks to recheck your blood pressure.   Thank you again for coming in.   Asencion Noble.D.

## 2018-11-21 NOTE — Assessment & Plan Note (Signed)
Is on omeprazole daily, she denies any issues taking this medication.  Denies any issues with acid reflux today.

## 2018-11-21 NOTE — Progress Notes (Signed)
CC: hypertension, hypothyroidism, and right neck pain  HPI:  [name] is a 63 y.o.  with a PMH listed below presenting for hypertension, hypothyroidism, and right neck pain.  Please see A&P for status of the patient's chronic medical conditions  Past Medical History:  Diagnosis Date  . Anemia   . Cancer [facility])    endometrial cancer  . Endometrial polyp   . GERD (gastroesophageal reflux disease)   . Heart murmur   . Heart palpitations   . History of blood transfusion   . History of chemotherapy   . History of radiation therapy   . History of uterine fibroid   . Hyperlipidemia   . Hypertension   . Hypothyroidism   . OSA (obstructive sleep apnea)    moderate OSA per study 06-21-2005--  per pt does need cpap anymore never purchased it  . Osteoarthritis   . PMB (postmenopausal bleeding)   . Wears dentures    upper  . Wears glasses    Review of Systems: Refer to history of present illness and assessment and plans for pertinent review of systems, all others reviewed and negative.  Physical Exam:  Vitals:   11/21/18 1419 11/21/18 1448 11/21/18 1501  BP: (!) 162/71 (!) 186/75 (!) 172/64  Pulse: 69 68 67  Temp: 99.5 F (37.5 C)    TempSrc: Oral    SpO2: 100% 100% 100%  Weight: 193 lb 3.2 oz (87.6 kg)    Height: 5' (1.524 m)      Physical Exam  Constitutional: She is oriented to person, place, and time and well-developed, well-nourished, and in no distress.  HENT:  Head: Normocephalic and atraumatic.  Mouth/Throat: Oropharynx is clear and moist. No oropharyngeal exudate.  Neck: Normal range of motion. Neck supple.  Right neck mass, TTP, mobile, unclear if part of thyroid  Cardiovascular: Normal rate, regular rhythm and normal heart sounds.  Pulmonary/Chest: Effort normal and breath sounds normal. No respiratory distress.  Abdominal: Soft. Bowel sounds are normal. She exhibits no distension.  Musculoskeletal: Normal range of motion.  Neurological: She is  alert and oriented to person, place, and time.  Skin: Skin is warm and dry.  Psychiatric: Mood and affect normal.    Social History   Socioeconomic History  . Marital status: Widowed    Spouse name: Not on file  . Number of children: 3  . Years of education: Not on file  . Highest education level: Not on file  Occupational History  . Occupation: Production manager, [employer]  Social Needs  . Financial resource strain: Not on file  . Food insecurity    Worry: Not on file    Inability: Not on file  . Transportation needs    Medical: Not on file    Non-medical: Not on file  Tobacco Use  . Smoking status: Former Smoker    Years: 2.00    Types: Cigarettes    Quit date: 02/28/1994    Years since quitting: 24.7  . Smokeless tobacco: Never Used  Substance and Sexual Activity  . Alcohol use: No    Alcohol/week: 0.0 standard drinks  . Drug use: No  . Sexual activity: Not on file  Lifestyle  . Physical activity    Days per week: Not on file    Minutes per session: Not on file  . Stress: Not on file  Relationships  . Social Herbalist on phone: Not on file    Gets together: Not on  file    Attends religious service: Not on file    Active member of club or organization: Not on file    Attends meetings of clubs or organizations: Not on file    Relationship status: Not on file  . Intimate partner violence    Fear of current or ex partner: Not on file    Emotionally abused: Not on file    Physically abused: Not on file    Forced sexual activity: Not on file  Other Topics Concern  . Not on file  Social History Narrative   Widowed she has 3 sons one died from heart issues.   She is employed with security at [employer]   No alcohol tobacco or drug use    Family History  Problem Relation Age of Onset  . Hypertension Mother   . Cirrhosis Father        alcoholic  . Heart disease Son   . Colon cancer Neg Hx   . Stomach cancer Neg Hx   . Rectal cancer  Neg Hx   . Esophageal cancer Neg Hx   . Liver cancer Neg Hx     Assessment & Plan:   See Encounters Tab for problem based charting.  Patient discussed with Dr. Lynnae January

## 2018-11-21 NOTE — Assessment & Plan Note (Addendum)
On synthroid 125 mcg daily, last TSH in 11/19 was 0.673. She does report a weight gain of about 20 lbs over the past few months.  She denies any issues with heat or cold intolerance, dry skin, hair loss, chest pain, palpitations, or other symptoms.  She does endorse some right neck pain, unclear if on thyroid or small nodes.  -Repeating TSH today -Continue Synthroid 125 mcg daily, adjust as needed  Addendum: TSH elevated to 13.6. Contacted patient regarding this result and discussed adjusting her synthroid to 150 mcg daily. Increased requirement could be related to her weight gain that she has had. Will have her follow up in 6 weeks to repeat TSH level.

## 2018-11-22 LAB — CBC
Hematocrit: 39.3 % (ref 34.0–46.6)
Hemoglobin: 13.1 g/dL (ref 11.1–15.9)
MCH: 28.7 pg (ref 26.6–33.0)
MCHC: 33.3 g/dL (ref 31.5–35.7)
MCV: 86 fL (ref 79–97)
Platelets: 248 10*3/uL (ref 150–450)
RBC: 4.56 x10E6/uL (ref 3.77–5.28)
RDW: 13.4 % (ref 11.7–15.4)
WBC: 7.3 10*3/uL (ref 3.4–10.8)

## 2018-11-22 LAB — TSH: TSH: 13.6 u[IU]/mL — ABNORMAL HIGH (ref 0.450–4.500)

## 2018-11-22 MED ORDER — LEVOTHYROXINE SODIUM 150 MCG PO TABS: 150.0000 ug | ORAL_TABLET | Freq: Every day | ORAL | 2 refills | Status: DC

## 2018-11-22 NOTE — Addendum Note (Signed)
Addended by: Asencion Noble on: 11/22/2018 03:36 PM   Modules accepted: Orders

## 2018-11-22 NOTE — Progress Notes (Signed)
Internal Medicine Clinic Attending  Case discussed with Dr. Krienke at the time of the visit.  We reviewed the resident's history and exam and pertinent patient test results.  I agree with the assessment, diagnosis, and plan of care documented in the resident's note.    

## 2018-11-25 ENCOUNTER — Other Ambulatory Visit: Payer: Self-pay | Admitting: Internal Medicine

## 2018-11-28 ENCOUNTER — Ambulatory Visit: Payer: Medicare Other | Admitting: Dietician

## 2018-11-28 ENCOUNTER — Other Ambulatory Visit: Payer: Self-pay

## 2018-11-28 ENCOUNTER — Encounter: Payer: Self-pay | Admitting: Dietician

## 2018-11-28 NOTE — Progress Notes (Signed)
Documentation Appt start time: 09:55 end time: 11:05  11/28/2018 Lydia Walsh [id number]  Assessment:  Primary concerns today: Lydia Walsh mentioned she wanted to lost weight but also wants to change some of her habits. Patient mentioned that she is eating fried chicken, cheese, ham and felt that wasn't good for her. Patient also mentioned that she feels she has been eating out more often and wasn't good for her weight and blood pressure as well.   Preferred Learning Style: No preference indicated  Learning Readiness: Ready    ANTHROPOMETRICS:  Wt Readings from Last 5 Encounters:  11/28/18 193 lb 8 oz (87.8 kg)  11/21/18 193 lb 3.2 oz (87.6 kg)  09/09/18 186 lb (84.4 kg)  09/05/18 186 lb (84.4 kg)  07/27/18 188 lb 3.2 oz (85.4 kg)  Body mass index is 37.79 kg/m.   SLEEP:  In bed around 10:00PM, falls asleep around 12:00AM, wakes up around 07:00AM. Mentions she wakes up during the night.   MEDICATIONS:  Current Outpatient Medications on File Prior to Visit  Medication Sig Dispense Refill  . allopurinol (ZYLOPRIM) 100 MG tablet Take 1 tablet by mouth once daily 90 tablet 1  . diclofenac sodium (VOLTAREN) 1 % GEL APPLY 2 GRAMS TOPICALLY 4 TIMES DAILY 100 g 1  . fluticasone (FLONASE) 50 MCG/ACT nasal spray Spray 1-2 sprays per nostril per day as needed for treatment of allergies. 16 g 5  . ibuprofen (ADVIL,MOTRIN) 200 MG tablet Take 200 mg by mouth as needed.    Marland Kitchen levothyroxine (SYNTHROID) 150 MCG tablet Take 1 tablet (150 mcg total) by mouth daily. 30 tablet 2  . metoprolol succinate (TOPROL-XL) 50 MG 24 hr tablet Take 3 tablets (150 mg total) by mouth daily. Take with or immediately following a meal. 90 tablet 1  . Multiple Vitamins-Minerals (CENTRUM PO) Take by mouth.    Marland Kitchen omeprazole (PRILOSEC) 20 MG capsule Take 1 capsule (20 mg total) by mouth daily. 90 capsule 0  . OVER THE COUNTER MEDICATION Alive Women's 50+ Gummie Multivitamin    . potassium chloride (K-DUR) 10 MEQ tablet  Take 1 tablet by mouth once daily 90 tablet 0  . simvastatin (ZOCOR) 20 MG tablet TAKE 1 TABLET BY MOUTH ONCE DAILY AT BEDTIME 30 tablet 11  . spironolactone (ALDACTONE) 25 MG tablet Take 2 tablets (50 mg total) by mouth daily. D/c previous spironolactone prescription 180 tablet 3  . [DISCONTINUED] esomeprazole (NEXIUM) 20 MG capsule Take 2 capsules (40 mg total) by mouth daily before breakfast. 30 capsule 3   No current facility-administered medications on file prior to visit.    DIETARY INTAKE: Usual eating pattern includes 3 meals and 0 snacks per day. Everyday foods include eggs, cheese, ham, fried chicken, coffee.  Patient has history of acid reflux. Has been managing by eating dinner earlier in the day (~06:00PM). Indicates no issues with after making this adjustment.  Dining Out (times/week): 2-3 times a week 24-hr recall:  B ( AM): Egg, hams, coffee with sugar, cheese   L ( PM): [employer] cheeseburger with sweet tea D ( PM): Cajun chicken and rice  Beverages: coffee, water, sweet tea   Usual physical activity: ~20 minutes of walking with dog  Progress Towards Goal(s):  In progress.   Nutritional Diagnosis:  NB-1.1 Food and nutrition-related knowledge deficit As related to hypertension  As evidenced by 24-hour recall and patient self-report.    Intervention:  Nutrition education on food choices for patient current lifestyle and preferences.   Action Goal:  Proposed other food choices and reaffirmed current items patient can have throughout the day. Outcome goal: Decrease amount of time eating out and reduce high sodium foods.  Coordination of care: none  Teaching Method Utilized: Visual, Auditory,Hands on Handouts: carbohydrate portion sizes and activity tracker Barriers to learning/adherence to lifestyle change: financial  Demonstrated degree of understanding via:  Teach Back   Monitoring/Evaluation: food chocies during breakfast and lunch and work up to 25 minutes of walking  a day  in 4 week(s).  Arnold Long  11/28/2018 11:54

## 2018-11-28 NOTE — Progress Notes (Signed)
I reviewed and approve this note and the plan. Debera Lat, RD 11/28/2018 1:39 PM.

## 2018-11-28 NOTE — Patient Instructions (Addendum)
Lydia Walsh,  Thank you for letting us talk to you today about nutrition!  We talked about increasing your walking time and writing down how many minutes you are walking each day with the sheet we gave you. (Include the time you wal at work.)  For example: Morning: 11 minutes Evening: 11 minutes  We also talked about things you can eat for breakfast, lunch, and dinner.  Breakfast: 2 eggs, cheese, a slice of bread, some coffee (if including ham, smaller portion) Lunch: [place] sandwich and some fruit  Dinner: Cooking baked chicken  Next time bring your walking log!  Arnold Long  11/28/2018 11:30 [phone]

## 2018-12-07 NOTE — Progress Notes (Deleted)
CC: ***  HPI:  [name] is a 63 y.o.  with a PMH listed below presenting for ***   Please see A&P for status of the patient's chronic medical conditions  Past Medical History:  Diagnosis Date  . Anemia   . Cancer [facility])    endometrial cancer  . Endometrial polyp   . GERD (gastroesophageal reflux disease)   . Heart murmur   . Heart palpitations   . History of blood transfusion   . History of chemotherapy   . History of radiation therapy   . History of uterine fibroid   . Hyperlipidemia   . Hypertension   . Hypothyroidism   . OSA (obstructive sleep apnea)    moderate OSA per study 06-21-2005--  per pt does need cpap anymore never purchased it  . Osteoarthritis   . PMB (postmenopausal bleeding)   . Wears dentures    upper  . Wears glasses    Review of Systems: Refer to history of present illness and assessment and plans for pertinent review of systems, all others reviewed and negative.  Physical Exam:  There were no vitals filed for this visit. *** Physical Exam  Social History   Socioeconomic History  . Marital status: Widowed    Spouse name: Not on file  . Number of children: 3  . Years of education: Not on file  . Highest education level: Not on file  Occupational History  . Occupation: Production manager, [employer]  Social Needs  . Financial resource strain: Not on file  . Food insecurity    Worry: Not on file    Inability: Not on file  . Transportation needs    Medical: Not on file    Non-medical: Not on file  Tobacco Use  . Smoking status: Former Smoker    Years: 2.00    Types: Cigarettes    Quit date: 02/28/1994    Years since quitting: 24.7  . Smokeless tobacco: Never Used  Substance and Sexual Activity  . Alcohol use: No    Alcohol/week: 0.0 standard drinks  . Drug use: No  . Sexual activity: Not on file  Lifestyle  . Physical activity    Days per week: Not on file    Minutes per session: Not on file  . Stress: Not on file   Relationships  . Social Herbalist on phone: Not on file    Gets together: Not on file    Attends religious service: Not on file    Active member of club or organization: Not on file    Attends meetings of clubs or organizations: Not on file    Relationship status: Not on file  . Intimate partner violence    Fear of current or ex partner: Not on file    Emotionally abused: Not on file    Physically abused: Not on file    Forced sexual activity: Not on file  Other Topics Concern  . Not on file  Social History Narrative   Widowed she has 3 sons one died from heart issues.   She is employed with security at [employer]   No alcohol tobacco or drug use   *** Family History  Problem Relation Age of Onset  . Hypertension Mother   . Cirrhosis Father        alcoholic  . Heart disease Son   . Colon cancer Neg Hx   . Stomach cancer Neg Hx   . Rectal  cancer Neg Hx   . Esophageal cancer Neg Hx   . Liver cancer Neg Hx     Assessment & Plan:   See Encounters Tab for problem based charting.  Patient {GC/GE:[id number]::"discussed with","seen with"} Dr. {NAMES:[id number]::"Butcher","Granfortuna","E. Hoffman","Klima","Mullen","Narendra","Raines","Vincent"}

## 2018-12-12 ENCOUNTER — Encounter: Payer: Medicare Other | Admitting: Internal Medicine

## 2018-12-12 ENCOUNTER — Ambulatory Visit (HOSPITAL_COMMUNITY): Payer: Medicare Other

## 2018-12-15 ENCOUNTER — Other Ambulatory Visit: Payer: Self-pay | Admitting: Internal Medicine

## 2018-12-15 DIAGNOSIS — E876 Hypokalemia: Secondary | ICD-10-CM

## 2018-12-15 DIAGNOSIS — I10 Essential (primary) hypertension: Secondary | ICD-10-CM

## 2018-12-19 ENCOUNTER — Other Ambulatory Visit: Payer: Self-pay

## 2018-12-19 ENCOUNTER — Telehealth: Payer: Self-pay | Admitting: *Deleted

## 2018-12-19 ENCOUNTER — Ambulatory Visit (HOSPITAL_COMMUNITY)
Admission: RE | Admit: 2018-12-19 | Discharge: 2018-12-19 | Disposition: A | Discharge status: Home / Self Care | Payer: Medicare Other | Source: Ambulatory Visit | Attending: Internal Medicine | Admitting: Internal Medicine

## 2018-12-19 DIAGNOSIS — M542 Cervicalgia: Secondary | ICD-10-CM

## 2018-12-19 NOTE — Telephone Encounter (Signed)
Received fax from [place] stating, "Please note: The brand of synthroid patient has been on is no longer available. We had to change brands. Hubbard Hartshorn, BSN, RN-BC

## 2018-12-27 ENCOUNTER — Ambulatory Visit (INDEPENDENT_AMBULATORY_CARE_PROVIDER_SITE_OTHER): Payer: Medicare Other | Admitting: Internal Medicine

## 2018-12-27 ENCOUNTER — Encounter: Payer: Self-pay | Admitting: Internal Medicine

## 2018-12-27 ENCOUNTER — Other Ambulatory Visit: Payer: Self-pay | Admitting: Internal Medicine

## 2018-12-27 VITALS — BP 148/76 | HR 72 | Temp 98.1°F | Wt 192.1 lb

## 2018-12-27 DIAGNOSIS — Z79899 Other long term (current) drug therapy: Secondary | ICD-10-CM

## 2018-12-27 DIAGNOSIS — Z79890 Hormone replacement therapy (postmenopausal): Secondary | ICD-10-CM

## 2018-12-27 DIAGNOSIS — I10 Essential (primary) hypertension: Secondary | ICD-10-CM

## 2018-12-27 DIAGNOSIS — E039 Hypothyroidism, unspecified: Secondary | ICD-10-CM

## 2018-12-27 NOTE — Progress Notes (Signed)
Internal Medicine Clinic Attending  Case discussed with Dr. Santos-Sanchez at the time of the visit.  We reviewed the resident's history and exam and pertinent patient test results.  I agree with the assessment, diagnosis, and plan of care documented in the resident's note.    

## 2018-12-27 NOTE — Assessment & Plan Note (Signed)
BP elevated today 174/68 and 148/65 when repeated.  She is on metoprolol 150 mg daily and spironolactone 50 mg daily.  She is compliant.  Seems BP has been elevated since her last visit in 11/2018.  I suspect this is probably because of weight gain in the setting of uncontrolled hypothyroidism.  She declined starting a new antihypertensive today, would like to discuss with her PCP.

## 2018-12-27 NOTE — Patient Instructions (Addendum)
Lydia Walsh,   Your doctor increased your thyroid medicine last time you were seen. We will check your thyroid function today again to make sure the change in dose is working.   Make a follow  Up appointment in 4 months with you regular doctor.  Call us if you have any questions or concerns.  - Dr. Frederico Hamman

## 2018-12-27 NOTE — Progress Notes (Signed)
   CC: Hypothyroidism follow-up  HPI:  [name] is a 63 y.o. year-old female with PMH listed below who presents to clinic for hypothyroidism follow-up. Please see problem based assessment and plan for further details.   Past Medical History:  Diagnosis Date  . Anemia   . Cancer [facility])    endometrial cancer  . Endometrial polyp   . GERD (gastroesophageal reflux disease)   . Heart murmur   . Heart palpitations   . History of blood transfusion   . History of chemotherapy   . History of radiation therapy   . History of uterine fibroid   . Hyperlipidemia   . Hypertension   . Hypothyroidism   . OSA (obstructive sleep apnea)    moderate OSA per study 06-21-2005--  per pt does need cpap anymore never purchased it  . Osteoarthritis   . PMB (postmenopausal bleeding)   . Wears dentures    upper  . Wears glasses    Review of Systems:   Review of Systems  Constitutional: Negative for weight loss.  Respiratory: Negative for shortness of breath.   Cardiovascular: Negative for chest pain and palpitations.  Neurological: Negative for dizziness and headaches.    Physical Exam:  Vitals:   12/27/18 0850 12/27/18 0912  BP: (!) 174/68 (!) 148/76  Pulse: 76 72  Temp: 98.1 F (36.7 C)   TempSrc: Oral   SpO2: 100%   Weight: 192 lb 1.6 oz (87.1 kg)     General: Well-appearing female in no acute distress Neck: No goiter or thyromegaly, no nodules palpated Cardiac: regular rate and rhythm, nl S1/S2, no murmurs, rubs or gallops Pulm: CTAB, no wheezes or crackles, no increased work Ext: warm and well perfused, no peripheral edema    Assessment & Plan:   See Encounters Tab for problem based charting.  Patient discussed with Dr. Philipp Ovens

## 2018-12-27 NOTE — Assessment & Plan Note (Signed)
Patient presents for hypothyroidism follow-up after PCP increased Synthroid 125 --> 150 mcg six weeks ago.  Reports doing well on this dose and has not noticed any more weight gain.  Her weight is down 1 pound.  Her pharmacy recently switched her Synthroid formulation and she is experiencing some mild GI symptoms (nausea) with the new medicine.  Otherwise no other symptoms.Thryoid US performed 12/7 showed heterogeneous gland consistent with medical thyroid disease, but no nodules or masses. -Check TSH -Discussed she can transfer her prescription to a different pharmacy that carries her previous Synthroid formulation

## 2018-12-28 ENCOUNTER — Ambulatory Visit: Payer: Medicare Other | Admitting: Dietician

## 2018-12-28 LAB — TSH: TSH: 0.138 u[IU]/mL — ABNORMAL LOW (ref 0.450–4.500)

## 2019-01-02 ENCOUNTER — Ambulatory Visit: Payer: Medicare Other | Admitting: Dietician

## 2019-01-21 ENCOUNTER — Other Ambulatory Visit: Payer: Self-pay | Admitting: Internal Medicine

## 2019-01-21 DIAGNOSIS — E039 Hypothyroidism, unspecified: Secondary | ICD-10-CM

## 2019-02-16 ENCOUNTER — Telehealth: Payer: Self-pay | Admitting: Internal Medicine

## 2019-02-16 NOTE — Telephone Encounter (Signed)
Pls contact pt [phone] regarding side effects from medicine

## 2019-02-16 NOTE — Telephone Encounter (Signed)
Pt states since starting EUTHYROX her throat has bothered her, tickling, "not feeling right, choking" since increasing the dose it got worse she stopped taking the med today and already she is not having the choking feeling, cough or tickle. appt 2/8 [facility] at 0845. Pt states she will not take it anymore til she sees dr

## 2019-02-20 ENCOUNTER — Ambulatory Visit (INDEPENDENT_AMBULATORY_CARE_PROVIDER_SITE_OTHER): Payer: Medicare Other | Admitting: Internal Medicine

## 2019-02-20 ENCOUNTER — Other Ambulatory Visit: Payer: Self-pay

## 2019-02-20 VITALS — BP 156/66 | HR 64 | Temp 99.2°F | Ht 60.0 in | Wt 192.2 lb

## 2019-02-20 DIAGNOSIS — Z79890 Hormone replacement therapy (postmenopausal): Secondary | ICD-10-CM

## 2019-02-20 DIAGNOSIS — E039 Hypothyroidism, unspecified: Secondary | ICD-10-CM

## 2019-02-20 MED ORDER — FLUTICASONE PROPIONATE 50 MCG/ACT NA SUSP: NASAL | 5 refills | Status: DC

## 2019-02-20 MED ORDER — LEVOTHYROXINE SODIUM 150 MCG PO TABS: 150.0000 ug | ORAL_TABLET | Freq: Every day | ORAL | 3 refills | Status: DC

## 2019-02-20 NOTE — Assessment & Plan Note (Addendum)
Hypothyroidism: Per chart review, she was taking Synthroid for her hypothyroidism however [facility] did not have the Synthroid formulation and late November, this was changed to Euthyrox.  She states that several weeks ago she began experiencing dry cough in the morning every time she takes the medication.  She did stop taking the medicine for about 2 to 3 days and noticed resolution of cough.  She denies any other systemic findings.  She denies dysphagia, odynophagia, choking sensation, weight loss.  Per chart review, she did report to the clinic with symptoms of dysphagia in 2017 and was advised to follow-up with GI for an EGD however looks like this has not been done.  It is reassuring today that she does not have any symptoms of mass-effect in her esophagus.  Plan: -New prescription for Synthroid [facility] mcg ([facility]) -Telehealth visit in 3 to 5 days to follow-up on symptoms. -IF symptoms improve, will list Euthyrox as allergy

## 2019-02-20 NOTE — Progress Notes (Signed)
   CC: "Cough", "Reaction to recent medication changes"  HPI:  Lydia Walsh is a 64 y.o. has medical history significant for hypothyroidism.  She presents today with complaints of cough after recent medication change.  Please see problem based charting for further details.  Past Medical History:  Diagnosis Date  . Anemia   . Cancer [facility])    endometrial cancer  . Endometrial polyp   . GERD (gastroesophageal reflux disease)   . Heart murmur   . Heart palpitations   . History of blood transfusion   . History of chemotherapy   . History of radiation therapy   . History of uterine fibroid   . Hyperlipidemia   . Hypertension   . Hypothyroidism   . OSA (obstructive sleep apnea)    moderate OSA per study 06-21-2005--  per pt does need cpap anymore never purchased it  . Osteoarthritis   . PMB (postmenopausal bleeding)   . Wears dentures    upper  . Wears glasses    Review of Systems:  As per HPI  Physical Exam:  Vitals:   02/20/19 0844 02/20/19 0845 02/20/19 0906  BP:  (!) 178/87 (!) 156/66  Pulse:  69 64  Temp:  99.2 F (37.3 C)   TempSrc:  Oral   SpO2:  100%   Weight: 192 lb 3.2 oz (87.2 kg)    Height: 5' (1.524 m)     Physical Exam  Constitutional: She is well-developed, well-nourished, and in no distress.  Cardiovascular: Normal rate, regular rhythm and normal heart sounds.  Pulmonary/Chest: Effort normal and breath sounds normal. No respiratory distress. She has no wheezes.    Assessment & Plan:   See Encounters Tab for problem based charting.  Patient discussed with Dr. Rebeca Alert

## 2019-02-20 NOTE — Patient Instructions (Signed)
Hi Adisa,   Thanks for seeing [place] today. As we discussed, we will switch your thyroid medication to a different pharmacy. Let us know how you do with the new medication.   Call us in 3-5 days and set up a TeleHealth appointment.  Take Care!

## 2019-02-22 NOTE — Progress Notes (Signed)
Internal Medicine Clinic Attending  Case discussed with Dr. Agyei at the time of the visit.  We reviewed the resident's history and exam and pertinent patient test results.  I agree with the assessment, diagnosis, and plan of care documented in the resident's note.  Helaina Stefano, M.D., Ph.D.  

## 2019-03-08 NOTE — Progress Notes (Signed)
Radiation Oncology         [phone] ________________________________  Name: Lydia Walsh MRN: [id number]  Date: 03/09/2019  DOB: [birth year]    Follow-Up Visit Note  CC: Asencion Noble, MD  Gordy Levan, MD    ICD-10-CM   1. International Federation 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 ([place])  C54.1     Diagnosis: Stage IIIC-1 grade 2 endometrioid adenocarcinoma    Interval Since Last Radiation:  3 years, 4 months, 2 weeks  1) 09/02/15-10/08/15: External beam radiation to the pelvis - 45 Gy in 25 fractions, simultaneous integrated boost to suspicious node 50 Gy  2) 10/15/15, 10/22/15, 10/24/15 - HDR vaginal brachytherapy - 18 Gy in 3 fractions.  Narrative:  The patient returns today for routine follow-up. She last saw Dr. Skeet Latch on 05/26/2018 with no evidence of recurrence.  On review of systems, she is without complaints today. She specifically denies dysuria or hematuria, vaginal bleeding or discharge, rectal bleeding, diarrhea or constipation, abdominal bloating, and nausea or vomiting.    ALLERGIES:  is allergic to hctz [hydrochlorothiazide].  Meds: Current Outpatient Medications  Medication Sig Dispense Refill  . allopurinol (ZYLOPRIM) 100 MG tablet Take 1 tablet by mouth once daily 90 tablet 0  . fluticasone (FLONASE) 50 MCG/ACT nasal spray Spray 1-2 sprays per nostril per day as needed for treatment of allergies. 16 g 5  . ibuprofen (ADVIL,MOTRIN) 200 MG tablet Take 200 mg by mouth as needed.    Marland Kitchen levothyroxine (SYNTHROID) 150 MCG tablet Take 1 tablet (150 mcg total) by mouth daily before breakfast. 30 tablet 3  . metoprolol succinate (TOPROL-XL) 50 MG 24 hr tablet Take 3 tablets (150 mg total) by mouth daily. Take with or immediately following a meal. 90 tablet 1  . Multiple Vitamins-Minerals (CENTRUM PO) Take by mouth.    Marland Kitchen omeprazole (PRILOSEC) 20 MG capsule Take 1 capsule by mouth once daily 90 capsule 0    . OVER THE COUNTER MEDICATION Alive Women's 50+ Gummie Multivitamin    . potassium chloride (KLOR-CON) 10 MEQ tablet Take 1 tablet by mouth once daily 90 tablet 0  . simvastatin (ZOCOR) 20 MG tablet TAKE 1 TABLET BY MOUTH ONCE DAILY AT BEDTIME 30 tablet 11  . spironolactone (ALDACTONE) 25 MG tablet TAKE 2 TABLETS BY MOUTH ONCE DAILY DISCONTINUE  PREVIOUS  SPIRONOLACTONE  PRESCRIPTION 180 tablet 0  . diclofenac sodium (VOLTAREN) 1 % GEL APPLY 2 GRAMS TOPICALLY 4 TIMES DAILY (Patient not taking: Reported on 03/09/2019) 100 g 1   No current facility-administered medications for this encounter.    Physical Findings: The patient is in no acute distress. Patient is alert and oriented.  weight is 193 lb (87.5 kg). Her temperature is 98.3 F (36.8 C). Her blood pressure is 162/65 (abnormal) and her pulse is 67. Her respiration is 20 and oxygen saturation is 100%.   Lungs are clear to auscultation bilaterally. Heart has regular rate and rhythm. No palpable cervical, supraclavicular, or axillary adenopathy. Abdomen soft, non-tender, normal bowel sounds.   On pelvic examination the external genitalia were unremarkable. A speculum exam was performed.  A small speculum was used for the examination.  The vaginal vault was noted to be shortened. There are no mucosal lesions noted in the vaginal vault. On bimanual and rectovaginal examination there were no pelvic masses appreciated.  Vaginal cuff intact.   Lab Findings: Lab Results  Component Value Date   WBC 7.3 11/21/2018   HGB  13.1 11/21/2018   HCT 39.3 11/21/2018   MCV 86 11/21/2018   PLT 248 11/21/2018    Radiographic Findings: No results found.  Impression:  No evidence of recurrence on clinical exam.   Plan: Patient will follow up in radiation oncology in 6 months  -----------------------------------  Blair Promise, PhD, MD  This document serves as a record of services personally performed by Gery Pray, MD. It was created on his  behalf by Wilburn Mylar, a trained medical scribe. The creation of this record is based on the scribe's personal observations and the provider's statements to them. This document has been checked and approved by the attending provider.

## 2019-03-09 ENCOUNTER — Other Ambulatory Visit: Payer: Self-pay

## 2019-03-09 ENCOUNTER — Ambulatory Visit
Admission: RE | Admit: 2019-03-09 | Discharge: 2019-03-09 | Disposition: A | Discharge status: Home / Self Care | Payer: Medicare Other | Source: Ambulatory Visit | Attending: Radiation Oncology | Admitting: Radiation Oncology

## 2019-03-09 ENCOUNTER — Encounter: Payer: Self-pay | Admitting: Radiation Oncology

## 2019-03-09 VITALS — BP 162/65 | HR 67 | Temp 98.3°F | Resp 20 | Wt 193.0 lb

## 2019-03-09 DIAGNOSIS — Z923 Personal history of irradiation: Secondary | ICD-10-CM | POA: Diagnosis not present

## 2019-03-09 DIAGNOSIS — Z79899 Other long term (current) drug therapy: Secondary | ICD-10-CM | POA: Insufficient documentation

## 2019-03-09 DIAGNOSIS — C541 Malignant neoplasm of endometrium: Secondary | ICD-10-CM

## 2019-03-09 DIAGNOSIS — Z8542 Personal history of malignant neoplasm of other parts of uterus: Secondary | ICD-10-CM | POA: Diagnosis not present

## 2019-03-09 NOTE — Patient Instructions (Signed)
Coronavirus (COVID-19) Are you at risk?  Are you at risk for the Coronavirus (COVID-19)?  To be considered HIGH RISK for Coronavirus (COVID-19), you have to meet the following criteria:  . Traveled to [place], [place], [place], [place] or [place]; or in the [place] to [place], [place], [place], or [place]; and have fever, cough, and shortness of breath within the last 2 weeks of travel OR . Been in close contact with a person diagnosed with COVID-19 within the last 2 weeks and have fever, cough, and shortness of breath . IF YOU DO NOT MEET THESE CRITERIA, YOU ARE CONSIDERED LOW RISK FOR COVID-19.  What to do if you are HIGH RISK for COVID-19?  . If you are having a medical emergency, call 911. . Seek medical care right away. Before you go to a doctor's office, urgent care or emergency department, call ahead and tell them about your recent travel, contact with someone diagnosed with COVID-19, and your symptoms. You should receive instructions from your physician's office regarding next steps of care.  . When you arrive at healthcare provider, tell the healthcare staff immediately you have returned from visiting [place], [place], [place], [place] or [place]; or traveled in the [place] to [place], [place], [place], or [place]; in the last two weeks or you have been in close contact with a person diagnosed with COVID-19 in the last 2 weeks.   . Tell the health care staff about your symptoms: fever, cough and shortness of breath. . After you have been seen by a medical provider, you will be either: o Tested for (COVID-19) and discharged home on quarantine except to seek medical care if symptoms worsen, and asked to  - Stay home and avoid contact with others until you get your results (4-5 days)  - Avoid travel on public transportation if possible (such as bus, train, or airplane) or o Sent to the Emergency Department by EMS for evaluation, COVID-19 testing, and possible  admission depending on your condition and test results.  What to do if you are LOW RISK for COVID-19?  Reduce your risk of any infection by using the same precautions used for avoiding the common cold or flu:  . Wash your hands often with soap and warm water for at least 20 seconds.  If soap and water are not readily available, use an alcohol-based hand sanitizer with at least 60% alcohol.  . If coughing or sneezing, cover your mouth and nose by coughing or sneezing into the elbow areas of your shirt or coat, into a tissue or into your sleeve (not your hands). . Avoid shaking hands with others and consider head nods or verbal greetings only. . Avoid touching your eyes, nose, or mouth with unwashed hands.  . Avoid close contact with people who are sick. . Avoid places or events with large numbers of people in one location, like concerts or sporting events. . Carefully consider travel plans you have or are making. . If you are planning any travel outside or inside the [place], visit the CDC's Travelers' Health webpage for the latest health notices. . If you have some symptoms but not all symptoms, continue to monitor at home and seek medical attention if your symptoms worsen. . If you are having a medical emergency, call 911.   ADDITIONAL HEALTHCARE OPTIONS FOR PATIENTS  [place] Tele[facility] / e-Visit: [email or web address]         [facility]: [phone]  [place]   [facility]: [phone]                   [facility]: [phone]   

## 2019-03-09 NOTE — Progress Notes (Signed)
Denies any issues with GYN area. Denies any GU issues.BP (!) 162/65 (BP Location: Left Arm, Patient Position: Sitting, Cuff Size: Large)   Pulse 67   Temp 98.3 F (36.8 C)   Resp 20   Wt 193 lb (87.5 kg)   LMP 07/16/2014   SpO2 100%   BMI 37.69 kg/m

## 2019-03-13 ENCOUNTER — Other Ambulatory Visit: Payer: Self-pay | Admitting: Internal Medicine

## 2019-03-13 DIAGNOSIS — E876 Hypokalemia: Secondary | ICD-10-CM

## 2019-03-15 ENCOUNTER — Other Ambulatory Visit: Payer: Self-pay | Admitting: Internal Medicine

## 2019-03-15 DIAGNOSIS — I10 Essential (primary) hypertension: Secondary | ICD-10-CM

## 2019-03-17 NOTE — Telephone Encounter (Signed)
Patient calling to check on refill request.  Will be taking last pill day.  Please call back.

## 2019-03-20 ENCOUNTER — Telehealth: Payer: Self-pay | Admitting: Internal Medicine

## 2019-03-20 NOTE — Telephone Encounter (Signed)
Pt is calling back regarding blood pressure medicine, pls contact [phone]

## 2019-03-20 NOTE — Telephone Encounter (Signed)
RTC to patient, informed her that her metoprolol and omeprazole RX's were both sent via [facility] this morning from her PCP. SChaplin, RN,BSN

## 2019-04-03 ENCOUNTER — Telehealth: Payer: Self-pay | Admitting: Cardiovascular Disease

## 2019-04-03 NOTE — Telephone Encounter (Signed)
Returned call to patient no answer.LMTC. 

## 2019-04-03 NOTE — Telephone Encounter (Signed)
Pt c/o of Chest Pain: STAT if CP now or developed within 24 hours  1. Are you having CP right now? No   2. Are you experiencing any other symptoms (ex. SOB, nausea, vomiting, sweating)? No  3. How long have you been experiencing CP? For the past 2 days   4. Is your CP continuous or coming and going? Coming and Going   5. Have you taken Nitroglycerin? No  Lydia Walsh is calling stating she has been experiencing CP for the past 2 days. She states no other symptoms are present, but she wanted to make Dr. Gwenlyn Found aware. The patient states she does not have an Nitroglycerin to take in regards to this. Please advise.   ?

## 2019-04-04 NOTE — Telephone Encounter (Signed)
Needs ROV with me to discuss 

## 2019-04-04 NOTE — Telephone Encounter (Signed)
Spoke with pt, offered appt tomorrow with dr berry and she declined due to another appt. Scheduled for Friday this week to follow up pain.

## 2019-04-04 NOTE — Telephone Encounter (Signed)
Called pt back after triage left her a message yesterday... she initially called to report that she was having on and off sharp chest pain on the left sie of her chest that lasts a few minutes everytime she rests. She denies pain with exertion. She also denies SOB, dizziness, no GI upset.   Pt says it has been happening for 2-3 days. She was worried it could just be "gas pain"... I instructed her to talk with her PCP about any OTC gas pain meds.   I will also forward to Dr. Gwenlyn Found to determine if he would like any further cardiac testing. Pt had CT 03/2018.

## 2019-04-07 ENCOUNTER — Other Ambulatory Visit: Payer: Self-pay

## 2019-04-07 ENCOUNTER — Ambulatory Visit (INDEPENDENT_AMBULATORY_CARE_PROVIDER_SITE_OTHER): Payer: Medicare Other | Admitting: Cardiovascular Disease

## 2019-04-07 ENCOUNTER — Encounter: Payer: Self-pay | Admitting: Cardiovascular Disease

## 2019-04-07 VITALS — BP 142/82 | HR 72 | Ht 60.0 in | Wt 191.8 lb

## 2019-04-07 DIAGNOSIS — R072 Precordial pain: Secondary | ICD-10-CM

## 2019-04-07 DIAGNOSIS — E785 Hyperlipidemia, unspecified: Secondary | ICD-10-CM | POA: Diagnosis not present

## 2019-04-07 DIAGNOSIS — Z79899 Other long term (current) drug therapy: Secondary | ICD-10-CM | POA: Diagnosis not present

## 2019-04-07 DIAGNOSIS — R002 Palpitations: Secondary | ICD-10-CM | POA: Diagnosis not present

## 2019-04-07 DIAGNOSIS — R079 Chest pain, unspecified: Secondary | ICD-10-CM

## 2019-04-07 DIAGNOSIS — I10 Essential (primary) hypertension: Secondary | ICD-10-CM | POA: Diagnosis not present

## 2019-04-07 DIAGNOSIS — Z01812 Encounter for preprocedural laboratory examination: Secondary | ICD-10-CM

## 2019-04-07 DIAGNOSIS — R931 Abnormal findings on diagnostic imaging of heart and coronary circulation: Secondary | ICD-10-CM

## 2019-04-07 LAB — BASIC METABOLIC PANEL
BUN/Creatinine Ratio: 21 (ref 12–28)
BUN: 15 mg/dL (ref 8–27)
CO2: 21 mmol/L (ref 20–29)
Calcium: 10 mg/dL (ref 8.7–10.3)
Chloride: 100 mmol/L (ref 96–106)
Creatinine, Ser: 0.72 mg/dL (ref 0.57–1.00)
GFR calc Af Amer: 102 mL/min/{1.73_m2} (ref >59)
GFR calc non Af Amer: 88 mL/min/{1.73_m2} (ref >59)
Glucose: 100 mg/dL — ABNORMAL HIGH (ref 65–99)
Potassium: 4.4 mmol/L (ref 3.5–5.2)
Sodium: 139 mmol/L (ref 134–144)

## 2019-04-07 MED ORDER — ATORVASTATIN CALCIUM 40 MG PO TABS: 40.0000 mg | ORAL_TABLET | Freq: Every day | ORAL | 3 refills | Status: DC

## 2019-04-07 NOTE — Assessment & Plan Note (Signed)
History of essential hypertension blood pressure measured today 142/82.  She is on metoprolol and spironolactone.

## 2019-04-07 NOTE — Assessment & Plan Note (Signed)
History of dyslipidemia on simvastatin 20 mg with a lipid lipid profile performed 09/12/2018 revealing total cholesterol 218, LDL 135 and HDL 62.  I am going to switch her to atorvastatin 40 mg and will recheck.

## 2019-04-07 NOTE — Assessment & Plan Note (Signed)
No longer an active problem 

## 2019-04-07 NOTE — Progress Notes (Signed)
04/07/2019 Lydia Walsh   Dec 14, 1955  [id number]  Primary Physician Asencion Noble, MD Primary Cardiologist: Lorretta Harp MD [name], [place]  HPI:  Lydia Walsh is a 64 y.o.  moderately overweight divorced African American female mother of 3 children and is [place] from [place]. She was referred by her PCP for evaluation of palpitations.  I last saw her in the office 09/09/2018.  She did see Rosaria Ferries, PA-C in the office 6 months later.  The left side her in the office-9/18. She's been evaluated for this in the past several years ago. She also has recent diagnosis of endometrial cancer in June of this year. She has no other cardiac risk factors. She does not drink caffeine.I ordered a 2-D echocardiogram which was entirely normal and the monitor that showed only sinus rhythm. Her symptoms palpitations have spontaneously resolved.Several months ago her palpitations have occurred in the last 2 weeks. She did remind me that she has an indwelling Port-A-Cath which may be contributory.  She  did have a CT scan performed 03/17/2018 that had incidental finding of coronary calcification .  At the time this was done she was asymptomatic although in the last week or so she is developed new onset atypical chest pain.  Based on this, I am going get a coronary CTA to further evaluate.    Current Meds  Medication Sig  . allopurinol (ZYLOPRIM) 100 MG tablet Take 1 tablet by mouth once daily  . diclofenac sodium (VOLTAREN) 1 % GEL APPLY 2 GRAMS TOPICALLY 4 TIMES DAILY  . fluticasone (FLONASE) 50 MCG/ACT nasal spray Spray 1-2 sprays per nostril per day as needed for treatment of allergies.  Marland Kitchen ibuprofen (ADVIL,MOTRIN) 200 MG tablet Take 200 mg by mouth as needed.  Marland Kitchen levothyroxine (SYNTHROID) 150 MCG tablet Take 1 tablet (150 mcg total) by mouth daily before breakfast.  . metoprolol succinate (TOPROL-XL) 50 MG 24 hr tablet TAKE 3 TABLETS BY MOUTH ONCE DAILY WITH MEALS  OR  IMMEDIATELY  FOLLOWING  . Multiple Vitamins-Minerals (CENTRUM PO) Take by mouth.  Marland Kitchen omeprazole (PRILOSEC) 20 MG capsule Take 1 capsule by mouth once daily  . OVER THE COUNTER MEDICATION Alive Women's 50+ Gummie Multivitamin  . potassium chloride (KLOR-CON) 10 MEQ tablet Take 1 tablet by mouth once daily  . spironolactone (ALDACTONE) 25 MG tablet TAKE 2 TABLETS BY MOUTH ONCE DAILY DISCONTINUE  PREVIOUS  SPIRONOLACTONE  PRESCRIPTION  . [DISCONTINUED] simvastatin (ZOCOR) 20 MG tablet TAKE 1 TABLET BY MOUTH ONCE DAILY AT BEDTIME     Allergies  Allergen Reactions  . Hctz [Hydrochlorothiazide] Rash    Social History   Socioeconomic History  . Marital status: Widowed    Spouse name: Not on file  . Number of children: 3  . Years of education: Not on file  . Highest education level: Not on file  Occupational History  . Occupation: Production manager, [employer]  Tobacco Use  . Smoking status: Former Smoker    Years: 2.00    Types: Cigarettes    Quit date: 02/28/1994    Years since quitting: 25.1  . Smokeless tobacco: Never Used  Substance and Sexual Activity  . Alcohol use: No    Alcohol/week: 0.0 standard drinks  . Drug use: No  . Sexual activity: Not on file  Other Topics Concern  . Not on file  Social History Narrative   Widowed she has 3 sons one died from heart issues.  She is employed with security at [employer]   No alcohol tobacco or drug use   Social Determinants of Radio broadcast assistant Strain:   . Difficulty of Paying Living Expenses:   Food Insecurity:   . Worried About Charity fundraiser in the Last Year:   . Arboriculturist in the Last Year:   Transportation Needs:   . Film/video editor (Medical):   Marland Kitchen Lack of Transportation (Non-Medical):   Physical Activity:   . Days of Exercise per Week:   . Minutes of Exercise per Session:   Stress:   . Feeling of Stress :   Social Connections:   . Frequency of Communication with Friends and  Family:   . Frequency of Social Gatherings with Friends and Family:   . Attends Religious Services:   . Active Member of Clubs or Organizations:   . Attends Archivist Meetings:   Marland Kitchen Marital Status:   Intimate Partner Violence:   . Fear of Current or Ex-Partner:   . Emotionally Abused:   Marland Kitchen Physically Abused:   . Sexually Abused:      Review of Systems: General: negative for chills, fever, night sweats or weight changes.  Cardiovascular: negative for chest pain, dyspnea on exertion, edema, orthopnea, palpitations, paroxysmal nocturnal dyspnea or shortness of breath Dermatological: negative for rash Respiratory: negative for cough or wheezing Urologic: negative for hematuria Abdominal: negative for nausea, vomiting, diarrhea, bright red blood per rectum, melena, or hematemesis Neurologic: negative for visual changes, syncope, or dizziness All other systems reviewed and are otherwise negative except as noted above.    Blood pressure (!) 142/82, pulse 72, height 5' (1.524 m), weight 191 lb 12.8 oz (87 kg), last menstrual period 07/16/2014.  General appearance: alert and no distress Neck: no adenopathy, no carotid bruit, no JVD, supple, symmetrical, trachea midline and thyroid not enlarged, symmetric, no tenderness/mass/nodules Lungs: clear to auscultation bilaterally Heart: regular rate and rhythm, S1, S2 normal, no murmur, click, rub or gallop Extremities: extremities normal, atraumatic, no cyanosis or edema Pulses: 2+ and symmetric Skin: Skin color, texture, turgor normal. No rashes or lesions Neurologic: Alert and oriented X 3, normal strength and tone. Normal symmetric reflexes. Normal coordination and gait  EKG sinus rhythm at 72 with first-degree AV block.  I personally reviewed this EKG.  ASSESSMENT AND PLAN:   Essential hypertension History of essential hypertension blood pressure measured today 142/82.  She is on metoprolol and  spironolactone.  Dyslipidemia History of dyslipidemia on simvastatin 20 mg with a lipid lipid profile performed 09/12/2018 revealing total cholesterol 218, LDL 135 and HDL 62.  I am going to switch her to atorvastatin 40 mg and will recheck.  Palpitations No longer an active problem  Coronary artery calcification seen on CT scan Lydia Walsh is complained of some atypical chest pain which began within the last week and separate every day lasting seconds at a time.  She did have coronary calcification seen on chest CT incidentally.  I am going to get a coronary calcium score.  If this is elevated we may obtain a coronary CTA.      Lorretta Harp MD FACP,FACC,FAHA, [facility] 04/07/2019 10:06 AM

## 2019-04-07 NOTE — Patient Instructions (Addendum)
Medication Instructions:  Stop Simvastatin 20 mg Start Atorvastatin 40 mg daily  Your Physician recommend you continue on your current medication as directed.    *If you need a refill on your cardiac medications before your next appointment, please call your pharmacy*   Lab Work: Your physician recommends that you return for lab work (BMP) today, then (Fasting Lipid, LFT) in 3 months    Testing/Procedures: Cardiac CT Angiography (CTA), is a special type of CT scan that uses a computer to produce multi-dimensional views of major blood vessels throughout the body. In CT angiography, a contrast material is injected through an IV to help visualize the blood vessels [facility]   Follow-Up: At [facility], you and your health needs are our priority.  As part of our continuing mission to provide you with exceptional heart care, we have created designated Provider Care Teams.  These Care Teams include your primary Cardiologist (physician) and Advanced Practice Providers (APPs -  Physician Assistants and Nurse Practitioners) who all work together to provide you with the care you need, when you need it.  We recommend signing up for the patient portal called "MyChart".  Sign up information is provided on this After Visit Summary.  MyChart is used to connect with patients for Virtual Visits (Telemedicine).  Patients are able to view lab/test results, encounter notes, upcoming appointments, etc.  Non-urgent messages can be sent to your provider as well.   To learn more about what you can do with MyChart, go to [email or web address].    Your next appointment:   1 year(s)  The format for your next appointment:   In Person  Provider:   Quay Burow, MD  Your cardiac CT will be scheduled at one of the below locations:   [facility] [address] [place], [place] [address] [phone]   If scheduled at [facility], please arrive at the [facility] main  entrance of [facility] 30 minutes prior to test start time. Proceed to the [facility] Radiology Department (first floor) to check-in and test prep.  If scheduled at [facility], please arrive 15 mins early for check-in and test prep.  Please follow these instructions carefully (unless otherwise directed):    On the Night Before the Test: . Be sure to Drink plenty of water. . Do not consume any caffeinated/decaffeinated beverages or chocolate 12 hours prior to your test. . Do not take any antihistamines 12 hours prior to your test.   On the Day of the Test: . Drink plenty of water. Do not drink any water within one hour of the test. . Do not eat any food 4 hours prior to the test. . You may take your regular medications prior to the test.  . Take metoprolol succinate 50 mg  two hours prior to test. . FEMALES- please wear underwire-free bra if available        After the Test: . Drink plenty of water. . After receiving IV contrast, you may experience a mild flushed feeling. This is normal. . On occasion, you may experience a mild rash up to 24 hours after the test. This is not dangerous. If this occurs, you can take Benadryl 25 mg and increase your fluid intake. . If you experience trouble breathing, this can be serious. If it is severe call 911 IMMEDIATELY. If it is mild, please call our office. . If you take any of these medications: Glipizide/Metformin, Avandament, Glucavance, please do not  take 48 hours after completing test unless otherwise instructed.   Once we have confirmed authorization from your insurance company, we will call you to set up a date and time for your test.   For non-scheduling related questions, please contact the cardiac imaging nurse navigator should you have any questions/concerns: Marchia Bond, RN Navigator Cardiac Imaging [facility] [phone] office  For scheduling needs, including  cancellations and rescheduling, please call [phone].

## 2019-04-07 NOTE — Assessment & Plan Note (Signed)
Lydia Walsh is complained of some atypical chest pain which began within the last week and separate every day lasting seconds at a time.  She did have coronary calcification seen on chest CT incidentally.  I am going to get a coronary calcium score.  If this is elevated we may obtain a coronary CTA.

## 2019-04-12 ENCOUNTER — Telehealth: Payer: Self-pay | Admitting: Cardiovascular Disease

## 2019-04-12 NOTE — Telephone Encounter (Signed)
Pt c/o medication issue:  1. Name of Medication: atorvastatin (LIPITOR) 40 MG tablet  2. How are you currently taking this medication (dosage and times per day)? Refuses to take  3. Are you having a reaction (difficulty breathing--STAT)? no  4. What is your medication issue? Patient states that she will not be taking this medication due to the side effects. She wants to know if there is another medication that she can be prescribed.

## 2019-04-12 NOTE — Addendum Note (Signed)
Addended by: Zebedee Iba on: 04/12/2019 07:18 AM   Modules accepted: Orders

## 2019-04-12 NOTE — Telephone Encounter (Signed)
Returned call to pt she states that she "went on the internet" and saw the side effects for atorvastatin and states that she "refuses to take" d/t the side effects. Tried to tall pt that not allpt's have the listed s/e but pt still refuses to take and would like something else instead. Please advise.

## 2019-04-16 NOTE — Telephone Encounter (Signed)
Crestor 20 mg/d

## 2019-04-17 ENCOUNTER — Other Ambulatory Visit: Payer: Self-pay | Admitting: Internal Medicine

## 2019-04-17 DIAGNOSIS — I10 Essential (primary) hypertension: Secondary | ICD-10-CM

## 2019-04-18 ENCOUNTER — Other Ambulatory Visit: Payer: Self-pay

## 2019-04-18 DIAGNOSIS — I10 Essential (primary) hypertension: Secondary | ICD-10-CM

## 2019-04-18 MED ORDER — ROSUVASTATIN CALCIUM 20 MG PO TABS: 20.0000 mg | ORAL_TABLET | Freq: Every day | ORAL | 3 refills | Status: DC

## 2019-04-18 NOTE — Telephone Encounter (Signed)
LEFT DETAILED MESSAGE AS TO MEDICATION CHANGE SHE REQUESTED. NEW RX SENT.

## 2019-04-19 ENCOUNTER — Other Ambulatory Visit: Payer: Self-pay | Admitting: Internal Medicine

## 2019-04-19 DIAGNOSIS — I10 Essential (primary) hypertension: Secondary | ICD-10-CM

## 2019-04-21 ENCOUNTER — Encounter: Payer: Self-pay | Admitting: Internal Medicine

## 2019-04-21 ENCOUNTER — Other Ambulatory Visit: Payer: Self-pay

## 2019-04-21 ENCOUNTER — Ambulatory Visit (INDEPENDENT_AMBULATORY_CARE_PROVIDER_SITE_OTHER): Payer: Medicare Other | Admitting: Internal Medicine

## 2019-04-21 VITALS — BP 142/59 | HR 68 | Temp 98.4°F | Ht 60.0 in | Wt 189.5 lb

## 2019-04-21 DIAGNOSIS — R1310 Dysphagia, unspecified: Secondary | ICD-10-CM

## 2019-04-21 DIAGNOSIS — E039 Hypothyroidism, unspecified: Secondary | ICD-10-CM

## 2019-04-21 MED ORDER — OMEPRAZOLE 20 MG PO CPDR: 20.0000 mg | DELAYED_RELEASE_CAPSULE | Freq: Two times a day (BID) | ORAL | 0 refills | Status: DC

## 2019-04-21 MED ORDER — LIDOCAINE VISCOUS HCL 2 % MT SOLN: 15.0000 mL | Freq: Four times a day (QID) | OROMUCOSAL | 0 refills | Status: DC | PRN

## 2019-04-21 MED ORDER — GUAIFENESIN-DM 100-10 MG/5ML PO SYRP: 5.0000 mL | ORAL_SOLUTION | ORAL | 0 refills | Status: DC | PRN

## 2019-04-21 NOTE — Patient Instructions (Addendum)
Ms. Leseman I have referred you to GI for your throat pain and painful swallowing.  I have increased your omeprazole to 20mg  twice daily.  Additionally I want you to take flonase daily, use robitussin DM to suppress your cough.  I have also prescribed viscous lidocaine to help with your throat pain.  While you are here we will check your thyroid function please continue to take your synthroid.

## 2019-04-21 NOTE — Progress Notes (Signed)
Internal Medicine Clinic Attending  Case discussed with Dr. Winfrey  at the time of the visit.  We reviewed the resident's history and exam and pertinent patient test results.  I agree with the assessment, diagnosis, and plan of care documented in the resident's note.  

## 2019-04-21 NOTE — Assessment & Plan Note (Addendum)
She reports coughing almost daily which usually when taking her levothyroxine and says this aggravates her throat irritation.  She says this started a week or two ago by chart review this has been going on for longer.  She was switched from euthyrox to synthroid for this last visit.  She does report pain with swallowing as well.  She reports no trouble swallowing.  She is currently taking ibuprofen daily.  She also takes omeprazole daily.  She denies any sinus drainage but does have allergies.  She is taking flonase.  She doesn't feel that the pills get stuck she drinks them with a full glass of water.  Not on ACEI or ARB  -stop ibuprofen -switch ppi to BID -referral to GI -daily flonase instead of intermittent -robitussin DM for cough -viscous lidocaine for discomfort

## 2019-04-21 NOTE — Assessment & Plan Note (Signed)
Hypothyroidism: she has been taking levothyroxine on an empty stomach at least 30 minutes without any other pills consistently for the last two months after trouble with her prior thyroid supplementation type.  No hypothyroid symptoms today, has also noticed losing 3lbs since her last visit.     -repeat TSH today

## 2019-04-21 NOTE — Progress Notes (Signed)
CC: odynophagia, hypothyroidism  HPI:  [name] is a 64 y.o. female with PMH below.  Today we will address odynophagia, hypothyroidism  Please see A&P for status of the patient's chronic medical conditions  Past Medical History:  Diagnosis Date  . Anemia   . Cancer [facility])    endometrial cancer  . Endometrial polyp   . GERD (gastroesophageal reflux disease)   . Heart murmur   . Heart palpitations   . History of blood transfusion   . History of chemotherapy   . History of radiation therapy   . History of uterine fibroid   . Hyperlipidemia   . Hypertension   . Hypothyroidism   . OSA (obstructive sleep apnea)    moderate OSA per study 06-21-2005--  per pt does need cpap anymore never purchased it  . Osteoarthritis   . PMB (postmenopausal bleeding)   . Wears dentures    upper  . Wears glasses    Review of Systems:  ROS: Pulmonary: pt denies increased work of breathing, shortness of breath,  Cardiac: pt denies palpitations, chest pain,  Abdominal: pt denies abdominal pain, nausea, vomiting, or diarrhea   Physical Exam:  Vitals:   04/21/19 0928 04/21/19 0929  BP:  (!) 142/59  Pulse:  68  Temp:  98.4 F (36.9 C)  TempSrc:  Oral  SpO2:  100%  Weight: 189 lb 8 oz (86 kg)   Height: 5' (1.524 m)    Cardiac:normal rate and rhythm, clear s1 and s2, no murmurs, rubs or gallops Pulmonary: CTAB, not in distress Abdominal: non distended abdomen, soft and nontender Throat: no pharyngeal edema or exudate,  Nose: no erythema in nasal turbinates, scant mucous   Social History   Socioeconomic History  . Marital status: Widowed    Spouse name: Not on file  . Number of children: 3  . Years of education: Not on file  . Highest education level: Not on file  Occupational History  . Occupation: Production manager, [employer]  Tobacco Use  . Smoking status: Former Smoker    Years: 2.00    Types: Cigarettes    Quit date: 02/28/1994    Years since quitting: 25.1    . Smokeless tobacco: Never Used  Substance and Sexual Activity  . Alcohol use: No    Alcohol/week: 0.0 standard drinks  . Drug use: No  . Sexual activity: Not on file  Other Topics Concern  . Not on file  Social History Narrative   Widowed she has 3 sons one died from heart issues.   She is employed with security at [employer]   No alcohol tobacco or drug use   Social Determinants of Radio broadcast assistant Strain:   . Difficulty of Paying Living Expenses:   Food Insecurity:   . Worried About Charity fundraiser in the Last Year:   . Arboriculturist in the Last Year:   Transportation Needs:   . Film/video editor (Medical):   Marland Kitchen Lack of Transportation (Non-Medical):   Physical Activity:   . Days of Exercise per Week:   . Minutes of Exercise per Session:   Stress:   . Feeling of Stress :   Social Connections:   . Frequency of Communication with Friends and Family:   . Frequency of Social Gatherings with Friends and Family:   . Attends Religious Services:   . Active Member of Clubs or Organizations:   . Attends Archivist Meetings:   .  Marital Status:   Intimate Partner Violence:   . Fear of Current or Ex-Partner:   . Emotionally Abused:   Marland Kitchen Physically Abused:   . Sexually Abused:     Family History  Problem Relation Age of Onset  . Hypertension Mother   . Cirrhosis Father        alcoholic  . Heart disease Son   . Colon cancer Neg Hx   . Stomach cancer Neg Hx   . Rectal cancer Neg Hx   . Esophageal cancer Neg Hx   . Liver cancer Neg Hx     Assessment & Plan:   See Encounters Tab for problem based charting.  Patient discussed with Dr. Angelia Mould

## 2019-04-22 LAB — TSH: TSH: 0.018 u[IU]/mL — ABNORMAL LOW (ref 0.450–4.500)

## 2019-04-24 ENCOUNTER — Telehealth: Payer: Self-pay | Admitting: Internal Medicine

## 2019-04-24 DIAGNOSIS — E039 Hypothyroidism, unspecified: Secondary | ICD-10-CM

## 2019-04-24 MED ORDER — LEVOTHYROXINE SODIUM 112 MCG PO TABS: 112.0000 ug | ORAL_TABLET | Freq: Every day | ORAL | 1 refills | Status: DC

## 2019-04-24 NOTE — Telephone Encounter (Signed)
Left message for pt with tft results and need to decrease synthroid dosage to 139mcg daily.  Will place prescription and d/c current prescription.  Will attempt to contact patient again at a later time to verify.  Will also send message to front desk about follow up appointment

## 2019-04-25 NOTE — Telephone Encounter (Signed)
Able to reach pt today, explained new dosage and need for follow up in around 6 weeks.  Pt is agreeable and will safely dispose of prior prescription at pharmacy and pick up new prescription from [facility].

## 2019-05-08 ENCOUNTER — Telehealth: Payer: Self-pay | Admitting: Internal Medicine

## 2019-05-08 NOTE — Telephone Encounter (Signed)
Pls contact pt [phone] 

## 2019-05-08 NOTE — Telephone Encounter (Signed)
ACC 4/28 at 1045 legs cramping bad

## 2019-05-10 ENCOUNTER — Other Ambulatory Visit: Payer: Self-pay

## 2019-05-10 ENCOUNTER — Encounter: Payer: Self-pay | Admitting: Internal Medicine

## 2019-05-10 ENCOUNTER — Ambulatory Visit (INDEPENDENT_AMBULATORY_CARE_PROVIDER_SITE_OTHER): Payer: Medicare Other | Admitting: Internal Medicine

## 2019-05-10 VITALS — BP 171/71 | HR 65 | Temp 98.3°F | Ht 60.0 in | Wt 191.2 lb

## 2019-05-10 DIAGNOSIS — R252 Cramp and spasm: Secondary | ICD-10-CM | POA: Diagnosis present

## 2019-05-10 MED ORDER — ALLOPURINOL 100 MG PO TABS: 100.0000 mg | ORAL_TABLET | Freq: Every day | ORAL | 0 refills | Status: DC

## 2019-05-10 NOTE — Progress Notes (Signed)
   CC: leg cramps  HPI:  [name] is a 64 y.o. F with significant PMH as outlined below, who presents for nocturnal leg cramping. Please see problem based charting for additional information.  Past Medical History:  Diagnosis Date  . Anemia   . Cancer [facility])    endometrial cancer  . Endometrial polyp   . GERD (gastroesophageal reflux disease)   . Heart murmur   . Heart palpitations   . History of blood transfusion   . History of chemotherapy   . History of radiation therapy   . History of uterine fibroid   . Hyperlipidemia   . Hypertension   . Hypothyroidism   . OSA (obstructive sleep apnea)    moderate OSA per study 06-21-2005--  per pt does need cpap anymore never purchased it  . Osteoarthritis   . PMB (postmenopausal bleeding)   . Wears dentures    upper  . Wears glasses     Review of Systems:   Review of Systems  Constitutional: Negative for chills and fever.  Respiratory: Negative for shortness of breath.   Cardiovascular: Negative for chest pain.  Gastrointestinal: Negative for abdominal pain, nausea and vomiting.  Musculoskeletal: Negative for joint pain and myalgias.       + Leg cramps  Neurological: Negative for tingling, sensory change and weakness.   Physical Exam:  Vitals:   05/10/19 1037  BP: (!) 171/71  Pulse: 65  Temp: 98.3 F (36.8 C)  TempSrc: Oral  SpO2: 100%  Weight: 191 lb 3.2 oz (86.7 kg)  Height: 5' (1.524 m)   Physical Exam Vitals and nursing note reviewed.  Constitutional:      General: She is not in acute distress.    Appearance: Normal appearance. She is obese. She is not ill-appearing.  Musculoskeletal:     Right lower leg: No edema.     Left lower leg: No edema.     Comments: Bilateral legs symmetric in size. Full ROM in bilateral knees and hips. No tenderness to palpation of quadriceps bilaterally. Negative Homan's sign of right and left lower legs.  Skin:    General: Skin is warm and dry.     Findings: No bruising,  erythema or rash.  Neurological:     Mental Status: She is alert.     Comments: Full strength and sensation in bilateral lower extremities.    Assessment & Plan:   See Encounters Tab for problem based charting.  Patient discussed with Dr. Dareen Piano

## 2019-05-10 NOTE — Patient Instructions (Signed)
Lydia Walsh,  It was nice meeting you today! I am sorry you starting having leg cramps at night again. We will check your blood work today to look at your electrolytes, potassium, and blood counts. I will call you with the results later this week. If they are normal, it is possible you have restless leg syndrome. But we will wait for the results to determine the next steps.  Continue taking your medications as prescribed.  Thank you for letting us be a part of your care!

## 2019-05-11 LAB — BMP8+ANION GAP
Anion Gap: 15 mmol/L (ref 10.0–18.0)
BUN/Creatinine Ratio: 16 (ref 12–28)
BUN: 11 mg/dL (ref 8–27)
CO2: 23 mmol/L (ref 20–29)
Calcium: 9.7 mg/dL (ref 8.7–10.3)
Chloride: 103 mmol/L (ref 96–106)
Creatinine, Ser: 0.69 mg/dL (ref 0.57–1.00)
GFR calc Af Amer: 106 mL/min/{1.73_m2} (ref >59)
GFR calc non Af Amer: 92 mL/min/{1.73_m2} (ref >59)
Glucose: 105 mg/dL — ABNORMAL HIGH (ref 65–99)
Potassium: 3.9 mmol/L (ref 3.5–5.2)
Sodium: 141 mmol/L (ref 134–144)

## 2019-05-11 LAB — CBC
Hematocrit: 37.3 % (ref 34.0–46.6)
Hemoglobin: 12.2 g/dL (ref 11.1–15.9)
MCH: 28.4 pg (ref 26.6–33.0)
MCHC: 32.7 g/dL (ref 31.5–35.7)
MCV: 87 fL (ref 79–97)
Platelets: 238 10*3/uL (ref 150–450)
RBC: 4.3 x10E6/uL (ref 3.77–5.28)
RDW: 13.7 % (ref 11.7–15.4)
WBC: 5.1 10*3/uL (ref 3.4–10.8)

## 2019-05-18 ENCOUNTER — Other Ambulatory Visit: Payer: Self-pay | Admitting: Internal Medicine

## 2019-05-18 ENCOUNTER — Encounter: Payer: Self-pay | Admitting: Internal Medicine

## 2019-05-18 ENCOUNTER — Ambulatory Visit (INDEPENDENT_AMBULATORY_CARE_PROVIDER_SITE_OTHER): Payer: Medicare Other | Admitting: Internal Medicine

## 2019-05-18 VITALS — BP 138/70 | HR 68 | Temp 96.5°F | Ht 60.0 in | Wt 188.0 lb

## 2019-05-18 DIAGNOSIS — J392 Other diseases of pharynx: Secondary | ICD-10-CM

## 2019-05-18 DIAGNOSIS — E039 Hypothyroidism, unspecified: Secondary | ICD-10-CM

## 2019-05-18 DIAGNOSIS — R0982 Postnasal drip: Secondary | ICD-10-CM

## 2019-05-18 DIAGNOSIS — R1310 Dysphagia, unspecified: Secondary | ICD-10-CM | POA: Diagnosis not present

## 2019-05-18 NOTE — Patient Instructions (Addendum)
We have referred you to [facility], Nose and Throat. Your appointment is Thursday May 20th 2021 at 1:10pm, please arrive at 1:00pm with Dr Constance Holster.  They are located at [address], [address]  [place].  Phone # [phone].   I appreciate the opportunity to care for you. Ronney Lion, [facility]

## 2019-05-18 NOTE — Progress Notes (Signed)
Lydia Walsh 64 y.o. August 08, 1955 [id number]  Assessment & Plan:   Encounter Diagnoses  Name Primary?  . Pill dysphagia Yes  . Post-nasal drip   . Throat irritation     Doubt serious problem but could be some problem in post pharynx area. Sxs seem restricted to one type of pill for most part and hx of some post nasal drip.  I will refer to [facility]NT for evaluation of these sxs and exam of pharynx.  If that is unrevealing and sxs persist would consider modified ba swallow.  Her TSH is low ? If any link but seems late for a hyperthyroid situation as dx w/ hypothyroidsm 3 yrs ago. Consider additional thyroid eval also.  I appreciate the opportunity to care for this patient.  Cc;Krienke, Adela Lank, MD Elie Goody, MD    Subjective:   Chief Complaint: throat irritation, trouble swallowing thyroid medicine  HPI Several month hx of throat irritation and difficult swallowing levothyroxine. Itchy and irritated throat. Has changed thyroid medication dose as TSH is low (decrease dose) but still has sxs. Other pills seem ok. Has to take levothyroxine on empty stomach "mouth is so dry then". Sounds like she has some post-nasal drip. Will cough some when lying down. No horaseness, esophageal dysphagia, cough with ingesting liquids. Cough is dry. PCP clinic had her try bid omeprazole 20 mg. She was having some nocturnal reflux in past but that stopped after no food past 6 PM.  Thyroid US ok 202 - heterogeneous glans but no enlargement or nodules No ACEI/ARB  Lab Results  Component Value Date   TSH 0.018 (L) 04/21/2019    Allergies  Allergen Reactions  . Hctz [Hydrochlorothiazide] Rash   Current Meds  Medication Sig  . allopurinol (ZYLOPRIM) 100 MG tablet Take 1 tablet (100 mg total) by mouth daily.  . fluticasone (FLONASE) 50 MCG/ACT nasal spray Spray 1-2 sprays per nostril per day as needed for treatment of allergies.  Marland Kitchen levothyroxine (SYNTHROID) 112 MCG tablet Take 1 tablet  (112 mcg total) by mouth daily.  Marland Kitchen lidocaine (XYLOCAINE) 2 % solution Use as directed 15 mLs in the mouth or throat every 6 (six) hours as needed for mouth pain.  . metoprolol succinate (TOPROL-XL) 50 MG 24 hr tablet TAKE 3 TABLETS BY MOUTH ONCE DAILY WITH MEALS OR  IMMEDIATELY  FOLLOWING  . Multiple Vitamins-Minerals (CENTRUM PO) Take by mouth.  Marland Kitchen omeprazole (PRILOSEC) 20 MG capsule Take 1 capsule (20 mg total) by mouth 2 (two) times daily before a meal.  . potassium chloride (KLOR-CON) 10 MEQ tablet Take 1 tablet by mouth once daily  . rosuvastatin (CRESTOR) 20 MG tablet Take 1 tablet (20 mg total) by mouth daily.  Marland Kitchen spironolactone (ALDACTONE) 25 MG tablet TAKE 2 TABLETS BY MOUTH ONCE DAILY DISCONTINUE  PREVIOUS  SPIRONOLACTONE  PRESCRIPTION   Past Medical History:  Diagnosis Date  . Anemia   . Cancer [facility])    endometrial cancer  . Endometrial polyp   . GERD (gastroesophageal reflux disease)   . Heart murmur   . Heart palpitations   . History of blood transfusion   . History of chemotherapy   . History of radiation therapy   . History of uterine fibroid   . Hyperlipidemia   . Hypertension   . Hypothyroidism   . OSA (obstructive sleep apnea)    moderate OSA per study 06-21-2005--  per pt does need cpap anymore never purchased it  . Osteoarthritis   . PMB (  postmenopausal bleeding)   . Wears dentures    upper  . Wears glasses    Past Surgical History:  Procedure Laterality Date  . ABDOMINAL HYSTERECTOMY    . BREAST EXCISIONAL BIOPSY Right    benign  . CHOLECYSTECTOMY    . CHOLECYSTECTOMY, LAPAROSCOPIC  2020  . COLONOSCOPY    . DILATATION & CURETTAGE/HYSTEROSCOPY WITH MYOSURE N/A 03/07/2015   Procedure: DILATATION & CURETTAGE/HYSTEROSCOPY WITH MYOSURE (POLYP);  Surgeon: Arvella Nigh, MD;  Location: [facility];  Service: Gynecology;  Laterality: N/A;  . DILATATION & CURRETTAGE/HYSTEROSCOPY WITH RESECTOCOPE  12-13-2002  &  09-26-2004   polyp/  submucosal fibroid    . ESOPHAGOGASTRODUODENOSCOPY    . EXCISION BENIGN RIGHT BREAST MASS  age 76  . IR REMOVAL TUN ACCESS W/ PORT W/O FL MOD SED  07/17/2016  . KNEE ARTHROSCOPY W/ MENISCECTOMY Bilateral right 04-12-2006//  left 12-29-2006   and Chondroplasty  . port placement    . ROBOTIC ASSISTED TOTAL HYSTERECTOMY WITH BILATERAL SALPINGO OOPHERECTOMY Bilateral 05/02/2015   Procedure: XI ROBOTIC ASSISTED TOTAL HYSTERECTOMY WITH BILATERAL SALPINGO OOPHORECTOMY AND SENTINAL LYMPH NODE BIOPSY;  Surgeon: Janie Morning, MD;  Location: [facility];  Service: Gynecology;  Laterality: Bilateral;  . TRANSTHORACIC ECHOCARDIOGRAM  04-02-2010   normal LV, ef 65%  . TUBAL LIGATION  1980's   Social History   Social History Narrative   Widowed she has 3 sons one died from heart issues.   She is employed with security at [employer]   No alcohol tobacco or drug use   family history includes Cirrhosis in her father; Heart disease in her son; Hypertension in her mother.   Review of Systems As per HPI  Objective:   Physical Exam BP 138/70   Pulse 68   Temp (!) 96.5 F (35.8 C)   Ht 5' (1.524 m)   Wt 188 lb (85.3 kg)   LMP 07/16/2014   BMI 36.72 kg/m  NAD Neck supple no TM/mass and non-tender and no LA Mouth and prox post pharynx NL, + dentures

## 2019-05-20 NOTE — Assessment & Plan Note (Signed)
Pt presents today for nocturnal leg cramping. She wonders if it is a side effect of one of her medications. Has been having bilateral leg cramps overnight for the last week or so. States they come on suddenly when she is laying down at night and are relieved by getting up and walking around. Located mostly in large muscle groups of quadriceps. No cramping in other areas of the body. Has a history of cramps like this which were attributed to hypokalemia. Also has history of iron deficiency anemia from chronic blood loss, which resolved two years ago. Currently taking spironolactone 25mg  daily with 2mEq oral K. Denies weakness, numbness, tingling, myalgias, joint pain, or lower back pain.   - will check labs with BMP and CBC today - if normal, would favor restless leg syndrome in this patient and begin pramipexole qhs - pt scheduled already for PCP f/u in 1 month

## 2019-05-22 NOTE — Progress Notes (Signed)
Internal Medicine Clinic Attending  Case discussed with Dr. Jones at the time of the visit.  We reviewed the resident's history and exam and pertinent patient test results.  I agree with the assessment, diagnosis, and plan of care documented in the resident's note.  

## 2019-05-30 ENCOUNTER — Other Ambulatory Visit: Payer: Self-pay | Admitting: Internal Medicine

## 2019-05-30 DIAGNOSIS — I10 Essential (primary) hypertension: Secondary | ICD-10-CM

## 2019-05-30 DIAGNOSIS — R1310 Dysphagia, unspecified: Secondary | ICD-10-CM

## 2019-05-30 NOTE — Telephone Encounter (Signed)
Next appt scheduled 5/24 with PCP.

## 2019-06-05 ENCOUNTER — Encounter: Payer: Self-pay | Admitting: Internal Medicine

## 2019-06-05 ENCOUNTER — Ambulatory Visit (INDEPENDENT_AMBULATORY_CARE_PROVIDER_SITE_OTHER): Payer: Medicare Other | Admitting: Internal Medicine

## 2019-06-05 VITALS — BP 148/62 | HR 61 | Temp 99.2°F | Ht 60.0 in | Wt 189.8 lb

## 2019-06-05 DIAGNOSIS — E039 Hypothyroidism, unspecified: Secondary | ICD-10-CM | POA: Diagnosis present

## 2019-06-05 DIAGNOSIS — I10 Essential (primary) hypertension: Secondary | ICD-10-CM

## 2019-06-05 DIAGNOSIS — R252 Cramp and spasm: Secondary | ICD-10-CM | POA: Diagnosis not present

## 2019-06-05 DIAGNOSIS — E876 Hypokalemia: Secondary | ICD-10-CM

## 2019-06-05 DIAGNOSIS — I2510 Atherosclerotic heart disease of native coronary artery without angina pectoris: Secondary | ICD-10-CM

## 2019-06-05 MED ORDER — SPIRONOLACTONE 25 MG PO TABS: 75.0000 mg | ORAL_TABLET | Freq: Every day | ORAL | 0 refills | Status: DC

## 2019-06-05 NOTE — Progress Notes (Signed)
CC: Bilateral lower extremity pain, HTN, hypothyroidism, and hypokalemia  HPI:  [name] is a 64 y.o.  with a PMH listed below presenting for bilateral lower extremity pain, hypertension, hypothyroidism and hypokalemia.   Please see A&P for status of the patient's chronic medical conditions  Past Medical History:  Diagnosis Date  . Anemia   . Endometrial cancer ([place])   . Endometrial polyp   . GERD (gastroesophageal reflux disease)   . Heart murmur   . Heart palpitations   . History of blood transfusion   . History of chemotherapy   . History of radiation therapy   . History of uterine fibroid   . Hyperlipidemia   . Hypertension   . Hypothyroidism   . OSA (obstructive sleep apnea)    moderate OSA per study 06-21-2005--  per pt does need cpap anymore never purchased it  . Osteoarthritis   . PMB (postmenopausal bleeding)   . Wears dentures    upper  . Wears glasses    Review of Systems: Refer to history of present illness and assessment and plans for pertinent review of systems, all others reviewed and negative.  Physical Exam:  Vitals:   06/05/19 1329 06/05/19 1410  BP: (!) 156/71 (!) 148/62  Pulse: 63 61  Temp: 99.2 F (37.3 C)   TempSrc: Oral   SpO2: 100%   Weight: 189 lb 12.8 oz (86.1 kg)   Height: 5' (1.524 m)    Physical Exam  Constitutional: She is oriented to person, place, and time and well-developed, well-nourished, and in no distress.  HENT:  Head: Normocephalic and atraumatic.  Cardiovascular: Normal rate, regular rhythm and normal heart sounds.  Pulmonary/Chest: Effort normal and breath sounds normal. No respiratory distress.  Abdominal: Soft. Bowel sounds are normal. She exhibits no distension.  Musculoskeletal:        General: Tenderness (TTP over bilateral anterior shins) present. No deformity or edema. Normal range of motion.  Neurological: She is alert and oriented to person, place, and time.  Skin: Skin is warm and dry. No rash  noted. No erythema.  Psychiatric: Mood and affect normal.    Social History   Socioeconomic History  . Marital status: Widowed    Spouse name: Not on file  . Number of children: 3  . Years of education: Not on file  . Highest education level: Not on file  Occupational History  . Occupation: Production manager, [employer]  Tobacco Use  . Smoking status: Former Smoker    Years: 2.00    Types: Cigarettes    Quit date: 02/28/1994    Years since quitting: 25.2  . Smokeless tobacco: Never Used  Substance and Sexual Activity  . Alcohol use: No    Alcohol/week: 0.0 standard drinks  . Drug use: No  . Sexual activity: Not on file  Other Topics Concern  . Not on file  Social History Narrative   Widowed she has 3 sons one died from heart issues.   She is employed with security at [employer]   No alcohol tobacco or drug use   Social Determinants of Radio broadcast assistant Strain:   . Difficulty of Paying Living Expenses:   Food Insecurity:   . Worried About Charity fundraiser in the Last Year:   . Arboriculturist in the Last Year:   Transportation Needs:   . Film/video editor (Medical):   Marland Kitchen Lack of Transportation (Non-Medical):   Physical Activity:   .  Days of Exercise per Week:   . Minutes of Exercise per Session:   Stress:   . Feeling of Stress :   Social Connections:   . Frequency of Communication with Friends and Family:   . Frequency of Social Gatherings with Friends and Family:   . Attends Religious Services:   . Active Member of Clubs or Organizations:   . Attends Archivist Meetings:   Marland Kitchen Marital Status:   Intimate Partner Violence:   . Fear of Current or Ex-Partner:   . Emotionally Abused:   Marland Kitchen Physically Abused:   . Sexually Abused:    Family History  Problem Relation Age of Onset  . Hypertension Mother   . Cirrhosis Father        alcoholic  . Heart disease Son   . Colon cancer Neg Hx   . Stomach cancer Neg Hx   . Rectal  cancer Neg Hx   . Esophageal cancer Neg Hx   . Liver cancer Neg Hx     Assessment & Plan:   See Encounters Tab for problem based charting.  Patient discussed with Dr. Dareen Piano

## 2019-06-05 NOTE — Patient Instructions (Signed)
Lydia Walsh,  It was a pleasure to see you today. Thank you for coming in.   Today we discussed your leg pains. In regards to this please hold your rosuvastatin for now, we are checking some labs to see if you are getting any muscle injury from this medication. If you do not have any improvement while you are holding it we may resume this medication.    We also discussed your blood pressure. It was a little high today. Please increase your spironolactone to 75 mg daily, continue taking the metoprolol. We will follow up in a few weeks to recheck your blood pressure. We are also checking some labs to see why your potassium is so low.   I am checking your thyroid levels again and will call you with the results.   Please return to clinic in 2 weeks or sooner if needed.   Thank you again for coming in.   Asencion Noble.D.

## 2019-06-06 LAB — BMP8+ANION GAP
Anion Gap: 17 mmol/L (ref 10.0–18.0)
BUN/Creatinine Ratio: 15 (ref 12–28)
BUN: 11 mg/dL (ref 8–27)
CO2: 21 mmol/L (ref 20–29)
Calcium: 9.8 mg/dL (ref 8.7–10.3)
Chloride: 105 mmol/L (ref 96–106)
Creatinine, Ser: 0.72 mg/dL (ref 0.57–1.00)
GFR calc Af Amer: 102 mL/min/{1.73_m2} (ref >59)
GFR calc non Af Amer: 88 mL/min/{1.73_m2} (ref >59)
Glucose: 107 mg/dL — ABNORMAL HIGH (ref 65–99)
Potassium: 4.4 mmol/L (ref 3.5–5.2)
Sodium: 143 mmol/L (ref 134–144)

## 2019-06-06 LAB — CK: Total CK: 60 U/L (ref 32–182)

## 2019-06-06 LAB — TSH: TSH: 0.124 u[IU]/mL — ABNORMAL LOW (ref 0.450–4.500)

## 2019-06-06 MED ORDER — DEXTROMETHORPHAN HBR 15 MG/5ML PO SYRP: 10.0000 mL | ORAL_SOLUTION | Freq: Four times a day (QID) | ORAL | 0 refills | Status: DC | PRN

## 2019-06-06 MED ORDER — LEVOTHYROXINE SODIUM 100 MCG PO TABS: 100.0000 ug | ORAL_TABLET | Freq: Every day | ORAL | 1 refills | Status: DC

## 2019-06-06 NOTE — Assessment & Plan Note (Signed)
Patient is currently on levothyroxine 112 mcg daily, TSH on 04/21/2019 was 0.018, the Synthroid was decreased at that time.  She reports feeling cold sometimes, denies any recent weight changes, appetite changes, difficulty swallowing, hair loss, or dry skin. Denies any issues taking her medications.   -Repeat TSH today.   -Continue Synthroid 112 mcg  Addendum: TSH 0.124, still low. Contacted patient and advised to decrease synthroid to 100 mcg daily. Repeat TSH in 6 weeks.

## 2019-06-06 NOTE — Assessment & Plan Note (Signed)
Patient reports that she has been having this bilateral lower extremity pain and cramping for a few months, worse with walking, improvement with rest, more on the shin area, lasts for a few hours at a time, hasn't tried anything for this. She reports that it started around the time of starting the statin medication, she was told by a pharmacist that if she had issues with muscle pains to contact her doctor. She reports that the pain seems to start a few hours after taking the statin. She reports holding the medication one day and reports that she didn't have pain on that day. She is following with Dr. Gwenlyn Found, Cardiology, for palpitations,also noted to have coronary artery calcifications on CT scan. Her lipid panel on 09/12/18 showed cholesterol 218, HDL 62, triglycerides 116, and LDL 135. Discussed that if the pain is related to the statin that we can hold the statin for now to see if the pain resolves, discussed risks and benefits of holding this for now since this could be due to statin induced myopathy. This also could be related to restless leg syndrome, would consider pramipexole if symptoms persist.   -CK, BMP -Hold statin for now, follow up in 2 weeks, resume statin if pain persists -If pain resolves, will need to start another medication for her HLD

## 2019-06-06 NOTE — Assessment & Plan Note (Signed)
Patient noted to be hypokalelmic requiring K supplementation. She is on spironolactone and still is needing K. Uncler etiology, has not been worked up yet. Will obtain Renin/alsosterone to further evaluate.

## 2019-06-06 NOTE — Assessment & Plan Note (Signed)
Patient is currently on metoprolol 150 mg daily and spironolactone 50 mg daily.  Blood pressure today is 156/71, repeat 148/62.  Also noted to be hypokalemic previously and is on K supplements. BMP on 05/10/19 showed K 3.9, kidney function stable.  Denies any issues taking her medications. Denies any side effects that she knows of. Not controlled at this time.   -Increase spironolactone to 75 mg daily -Continue metoprolol 150 mg daily -Repeat BMP

## 2019-06-08 NOTE — Progress Notes (Signed)
Internal Medicine Clinic Attending  Case discussed with Dr. Krienke at the time of the visit.  We reviewed the resident's history and exam and pertinent patient test results.  I agree with the assessment, diagnosis, and plan of care documented in the resident's note.    

## 2019-06-09 LAB — ALDOSTERONE + RENIN ACTIVITY W/ RATIO
ALDOS/RENIN RATIO: 7.4 (ref 0.0–30.0)
ALDOSTERONE: 2.6 ng/dL (ref 0.0–30.0)
Renin: 0.35 ng/mL/hr (ref 0.167–5.380)

## 2019-06-20 ENCOUNTER — Other Ambulatory Visit: Payer: Self-pay | Admitting: Internal Medicine

## 2019-06-20 DIAGNOSIS — E039 Hypothyroidism, unspecified: Secondary | ICD-10-CM

## 2019-06-24 ENCOUNTER — Other Ambulatory Visit: Payer: Self-pay | Admitting: Internal Medicine

## 2019-06-24 DIAGNOSIS — E876 Hypokalemia: Secondary | ICD-10-CM

## 2019-07-05 ENCOUNTER — Other Ambulatory Visit: Payer: Self-pay | Admitting: Internal Medicine

## 2019-07-05 DIAGNOSIS — E039 Hypothyroidism, unspecified: Secondary | ICD-10-CM

## 2019-07-10 ENCOUNTER — Other Ambulatory Visit: Payer: Self-pay

## 2019-07-10 NOTE — Telephone Encounter (Signed)
Requesting to speak with a nurse about meds, please call pt back.  

## 2019-07-10 NOTE — Telephone Encounter (Signed)
Called pt, she is confused as to what pharm she uses for her [facility]othyroxine [facility], [facility] it up there, informed pt, she ask what medicine do I get at [facility], went thru med list everything is listed at [facility]. Informed her to go thru [facility] meds [facility] . She is agreeable

## 2019-07-18 ENCOUNTER — Telehealth: Payer: Self-pay | Admitting: Internal Medicine

## 2019-07-18 NOTE — Telephone Encounter (Signed)
Pt states she doesn't know why she is so tired all the time. She states it might be her iron in her blood and she told her dr but has not heard anything. Please advise appt? labwork?

## 2019-07-18 NOTE — Telephone Encounter (Signed)
I tried to call patient regarding her symptoms. She did not answer. I left a HIPPA compliant voice mail to call us back.

## 2019-07-18 NOTE — Telephone Encounter (Signed)
Pls contact pt regarding iron medicine [phone]

## 2019-07-28 ENCOUNTER — Other Ambulatory Visit: Payer: Self-pay | Admitting: Internal Medicine

## 2019-07-28 DIAGNOSIS — E039 Hypothyroidism, unspecified: Secondary | ICD-10-CM

## 2019-08-01 ENCOUNTER — Other Ambulatory Visit: Payer: Self-pay | Admitting: Cardiovascular Disease

## 2019-08-06 ENCOUNTER — Other Ambulatory Visit: Payer: Self-pay | Admitting: Internal Medicine

## 2019-08-06 DIAGNOSIS — E039 Hypothyroidism, unspecified: Secondary | ICD-10-CM

## 2019-08-28 ENCOUNTER — Telehealth: Payer: Self-pay | Admitting: Internal Medicine

## 2019-08-28 DIAGNOSIS — Z1231 Encounter for screening mammogram for malignant neoplasm of breast: Secondary | ICD-10-CM

## 2019-08-28 NOTE — Telephone Encounter (Signed)
Pt calling to request a Referral to have her Annual Mammogram Order placed with [facility]. Per their office a new Order must be placed.  Please advise if an order can be placed.

## 2019-08-28 NOTE — Telephone Encounter (Signed)
Hello, that would be fine. I placed that order.

## 2019-08-31 ENCOUNTER — Other Ambulatory Visit: Payer: Self-pay | Admitting: Internal Medicine

## 2019-08-31 DIAGNOSIS — I10 Essential (primary) hypertension: Secondary | ICD-10-CM

## 2019-09-05 ENCOUNTER — Telehealth: Payer: Self-pay | Admitting: Internal Medicine

## 2019-09-05 NOTE — Telephone Encounter (Signed)
Pt desires appt in am, cannot come in in afternoon, states she has felt this before, ask to call 911 if face/ mouth start to swell, short of breath, she is agreeable appt 1015 dr Myrtie Hawk 8/25

## 2019-09-05 NOTE — Telephone Encounter (Signed)
Pt feels like something is in her throat x 1 week. Please call patient.

## 2019-09-06 ENCOUNTER — Encounter: Payer: Self-pay | Admitting: Internal Medicine

## 2019-09-06 ENCOUNTER — Ambulatory Visit (INDEPENDENT_AMBULATORY_CARE_PROVIDER_SITE_OTHER): Payer: Medicare Other | Admitting: Internal Medicine

## 2019-09-06 ENCOUNTER — Other Ambulatory Visit: Payer: Self-pay

## 2019-09-06 VITALS — BP 149/73 | HR 67 | Temp 98.0°F | Ht 60.0 in | Wt 189.0 lb

## 2019-09-06 DIAGNOSIS — E039 Hypothyroidism, unspecified: Secondary | ICD-10-CM

## 2019-09-06 DIAGNOSIS — I10 Essential (primary) hypertension: Secondary | ICD-10-CM | POA: Diagnosis not present

## 2019-09-06 DIAGNOSIS — R070 Pain in throat: Secondary | ICD-10-CM | POA: Diagnosis present

## 2019-09-06 NOTE — Progress Notes (Signed)
° °  CC: Throat discomfort   HPI:  [name] is a 64 y.o. female with PMHx as documented below, presents with discomfort in her throat. Please refer to problem based charting for further details and assessment and plan of current problem and chronic medical conditions.    Past Medical History:  Diagnosis Date   Anemia    Endometrial cancer ([place])    Endometrial polyp    GERD (gastroesophageal reflux disease)    Heart murmur    Heart palpitations    History of blood transfusion    History of chemotherapy    History of radiation therapy    History of uterine fibroid    Hyperlipidemia    Hypertension    Hypothyroidism    OSA (obstructive sleep apnea)    moderate OSA per study 06-21-2005--  per pt does need cpap anymore never purchased it   Osteoarthritis    PMB (postmenopausal bleeding)    Wears dentures    upper   Wears glasses    Review of Systems:  . Negative except as mentioned in HPI and assessment and plan.  Physical Exam:  Vitals:   09/06/19 1031  BP: (!) 149/73  Pulse: 67  Temp: 98 F (36.7 C)  TempSrc: Oral  SpO2: 98%  Weight: 189 lb (85.7 kg)  Height: 5' (1.524 m)   Physical Exam Constitutional:      General: She is not in acute distress.    Appearance: Normal appearance. She is not ill-appearing.  HENT:     Mouth/Throat:     Pharynx: Oropharynx is clear. No oropharyngeal exudate.     Comments: No thyromegaly, no thyroid nodule No tenderness on thyroid Cardiovascular:     Rate and Rhythm: Normal rate and regular rhythm.     Pulses: Normal pulses.     Heart sounds: Normal heart sounds. No murmur heard.   Pulmonary:     Breath sounds: No wheezing or rales.  Abdominal:     Palpations: Abdomen is soft.     Tenderness: There is no abdominal tenderness.  Neurological:     General: No focal deficit present.     Mental Status: She is alert and oriented to person, place, and time.  Psychiatric:        Mood and Affect: Mood  normal.        Behavior: Behavior normal.        Thought Content: Thought content normal.    Assessment & Plan:   See Encounters Tab for problem based charting.  Patient discussed with Dr. Rebeca Alert

## 2019-09-06 NOTE — Progress Notes (Signed)
Radiation Oncology         [phone] ________________________________  Name: Lydia Walsh MRN: [id number]  Date: 09/07/2019  DOB: 1955/02/16    Follow-Up Visit Note  CC: Asencion Noble, MD  Asencion Noble, MD    ICD-10-CM   1. International Federation 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 ([place])  C54.1   2. Endometrial cancer ([facility])  C54.1     Diagnosis: Stage IIIC-1 grade 2 endometrioid adenocarcinoma    Interval Since Last Radiation: Three years, ten months, and two weeks.  1) 09/02/2015 - 10/08/2015: External beam radiation to the pelvis - 45 Gy in 25 fractions, simultaneous integrated boost to suspicious node 50 Gy  2) 10/15/2015, 10/22/2015, 10/24/2015 - HDR vaginal brachytherapy - 18 Gy in 3 fractions.  Narrative: The patient returns today for routine follow-up. No significant interval history with the exception of regular PCP and cardiology follow-ups.  On review of systems, she reports no complaints. She denies pelvic pain, vaginal bleeding, and changes in bowel/bladder patterns.  Patient describes "gets a catch" in her right groin occasionally when turning to the right.   ALLERGIES:  is allergic to hctz [hydrochlorothiazide].  Meds: Current Outpatient Medications  Medication Sig Dispense Refill  . allopurinol (ZYLOPRIM) 100 MG tablet Take 1 tablet (100 mg total) by mouth daily. 90 tablet 0  . dextromethorphan 15 MG/5ML syrup Take 10 mLs (30 mg total) by mouth 4 (four) times daily as needed for cough. 120 mL 0  . fluticasone (FLONASE) 50 MCG/ACT nasal spray Spray 1-2 sprays per nostril per day as needed for treatment of allergies. 16 g 5  . metoprolol succinate (TOPROL-XL) 50 MG 24 hr tablet TAKE 3 TABLETS BY MOUTH ONCE DAILY WITH MEALS OR  IMMEDIATELY  FOLLOWING 270 tablet 1  . Multiple Vitamins-Minerals (CENTRUM PO) Take by mouth.    Marland Kitchen omeprazole (PRILOSEC) 20 MG capsule Take 1 capsule by mouth once daily 90  capsule 0  . potassium chloride (KLOR-CON) 10 MEQ tablet Take 1 tablet by mouth once daily 90 tablet 1  . rosuvastatin (CRESTOR) 20 MG tablet Take 1 tablet by mouth once daily 90 tablet 0  . spironolactone (ALDACTONE) 25 MG tablet Take 3 tablets by mouth once daily 90 tablet 1  . EUTHYROX 100 MCG tablet Take 1 tablet by mouth once daily 90 tablet 1  . lidocaine (XYLOCAINE) 2 % solution Use as directed 15 mLs in the mouth or throat every 6 (six) hours as needed for mouth pain. (Patient not taking: Reported on 09/07/2019) 600 mL 0   No current facility-administered medications for this encounter.    Physical Findings: The patient is in no acute distress. Patient is alert and oriented.  height is 5' (1.524 m) and weight is 189 lb 9.6 oz (86 kg). Her temperature is 97.8 F (36.6 C). Her blood pressure is 155/59 (abnormal) and her pulse is 71. Her respiration is 20 and oxygen saturation is 100%.   Lungs are clear to auscultation bilaterally. Heart has regular rate and rhythm. No palpable cervical, supraclavicular, or axillary adenopathy. Abdomen soft, non-tender, normal bowel sounds.   On pelvic examination the external genitalia were unremarkable. A speculum exam was performed. A small speculum was used for the examination. The vaginal vault was noted to be shortened. There are no mucosal lesions noted in the vaginal vault. On bimanual and rectovaginal examination there were no pelvic masses appreciated. Vaginal cuff intact.  No inguinal adenopathy bilaterally.  No  obvious hernia when coughing along the right groin area.   Lab Findings: Lab Results  Component Value Date   WBC 5.1 05/10/2019   HGB 12.2 05/10/2019   HCT 37.3 05/10/2019   MCV 87 05/10/2019   PLT 238 05/10/2019    Radiographic Findings: No results found.  Impression: Stage IIIC-1 grade 2 endometrioid adenocarcinoma    No evidence of recurrence on clinical exam.  She is not following up with gynecologic oncology at this  time.  Plan: The patient will follow-up with radiation oncology in six months. She is scheduled to undergo a routine screening mammogram on 09/13/2019.   Total time spent in this encounter was 22 minutes which included reviewing the patient's most recent interval history, physical examination, and documentation.  -----------------------------------  Blair Promise, PhD, MD  This document serves as a record of services personally performed by Gery Pray, MD. It was created on his behalf by Clerance Lav, a trained medical scribe. The creation of this record is based on the scribe's personal observations and the provider's statements to them. This document has been checked and approved by the attending provider.

## 2019-09-06 NOTE — Patient Instructions (Signed)
Thank you for allowing [place] to provide your care today. Today you came in with discomfort in your throat.  Your physical exam did not show any food or foreign body stuck in your tonsils. I refer you to ENT doctor for further elevation and recommendation. Also, make sure to follow-up with your GI doctor as a scheduled for you.  As we discussed, you need some blood work for your thyroid disease and also.  I understand you decline blood work today.  Please follow-up with Dr. Sherry Ruffing, your primary care doctor for follow-up of your blood pressure and thyroid disease.  As always, if having severe symptoms, please seek medical attention at emergency room. Should you have any questions or concerns please call the internal medicine clinic at [phone].    Thank you!

## 2019-09-07 ENCOUNTER — Ambulatory Visit
Admission: RE | Admit: 2019-09-07 | Discharge: 2019-09-07 | Disposition: A | Discharge status: Home / Self Care | Payer: Medicare Other | Source: Ambulatory Visit | Attending: Radiation Oncology | Admitting: Radiation Oncology

## 2019-09-07 ENCOUNTER — Other Ambulatory Visit: Payer: Self-pay

## 2019-09-07 ENCOUNTER — Encounter: Payer: Self-pay | Admitting: Radiation Oncology

## 2019-09-07 VITALS — BP 155/59 | HR 71 | Temp 97.8°F | Resp 20 | Ht 60.0 in | Wt 189.6 lb

## 2019-09-07 DIAGNOSIS — Z79899 Other long term (current) drug therapy: Secondary | ICD-10-CM | POA: Insufficient documentation

## 2019-09-07 DIAGNOSIS — R070 Pain in throat: Secondary | ICD-10-CM | POA: Insufficient documentation

## 2019-09-07 DIAGNOSIS — Z8542 Personal history of malignant neoplasm of other parts of uterus: Secondary | ICD-10-CM | POA: Insufficient documentation

## 2019-09-07 DIAGNOSIS — Z923 Personal history of irradiation: Secondary | ICD-10-CM | POA: Diagnosis not present

## 2019-09-07 DIAGNOSIS — C541 Malignant neoplasm of endometrium: Secondary | ICD-10-CM

## 2019-09-07 NOTE — Assessment & Plan Note (Addendum)
Patient is currently on metoprolol 150 mg daily and spironolactone 75 mg daily (increased last visit by PCP).  Blood pressure today is 149/73, HR 67. Her BP improved by still above the goal.  She is allergic to HCTZ. (Also has gout). Per chart review was on Amlodipine before. I am not sure if there is any contraindication for starting that. She states that she will follow up with Dr. Sherry Ruffing.  -Continue Spironolactone to 75 mg daily -Continue metoprolol 150 mg daily -F/u inc linic in 3 months

## 2019-09-07 NOTE — Assessment & Plan Note (Addendum)
He mentions that it feels like something stuck in her upper part of her throat, the she does not think it is food residual.   Denies eating fish or chicken with bones. No fever or chills, no cough. No sore throat. Swallowing brings her some discomfort but she does not report odynophagia or dysphagia. He mentions that she had similar symptoms before and was evaluated in [facility]. She was prescribed PPI and referred to GI. GI recommended her to be seen by ENT. She took PPI and her symptoms resolved. She was then evaluated by ENT and apparently, the focus on that encounter was mostly about ear problems. No assessment or recommendation provided regarding her throat issue)  Thyroid exam and oropharyngeal exam is unremarkable today. No lymphadenopathy.   No evidence of thyroiditis. Does not seem to be dysphagia. Her symptoms did not improved by PPI this time and no association between throat discomfort with reflux, eating or laying flat.  Given recurrent symptoms and for comprehensive pharyngeal exam, I will refer her to ENT to be evaluated again.  -Ambulatory referral to ENT. We appreciate recommendations

## 2019-09-07 NOTE — Assessment & Plan Note (Signed)
Hypothyroid: Prior TSH results show supratherapeutic numbers.  Levothyroxine dose decreased to 100 mcg, (112) last visit.  Patient did not follow-up repeat blood work.  She would like to defer for to next visit. (Dr. Lynwood Dawley notified)  -Follow-up in clinic to repeat blood work in next 1-3 months.

## 2019-09-07 NOTE — Progress Notes (Signed)
Patient here for a f/u visit with Dr. Sondra Come. Denies pain, vaginal bleeding or problems with her bladder or bowels.  Wt Readings from Last 3 Encounters:  09/07/19 189 lb 9.6 oz (86 kg)  09/06/19 189 lb (85.7 kg)  06/05/19 189 lb 12.8 oz (86.1 kg)    BP (!) 155/59 (BP Location: Right Arm, Patient Position: Sitting, Cuff Size: Large)   Pulse 71   Temp 97.8 F (36.6 C)   Resp 20   Ht 5' (1.524 m)   Wt 189 lb 9.6 oz (86 kg)   LMP 07/16/2014   SpO2 100%   BMI 37.03 kg/m

## 2019-09-08 NOTE — Progress Notes (Signed)
Internal Medicine Clinic Attending  Case discussed with Dr. Masoudi at the time of the visit.  We reviewed the resident's history and exam and pertinent patient test results.  I agree with the assessment, diagnosis, and plan of care documented in the resident's note.  Maaz Spiering, M.D., Ph.D.  

## 2019-09-13 ENCOUNTER — Ambulatory Visit
Admission: RE | Admit: 2019-09-13 | Discharge: 2019-09-13 | Disposition: A | Discharge status: Home / Self Care | Payer: Medicare Other | Source: Ambulatory Visit | Attending: Internal Medicine | Admitting: Internal Medicine

## 2019-09-13 ENCOUNTER — Other Ambulatory Visit: Payer: Self-pay

## 2019-09-13 DIAGNOSIS — Z1231 Encounter for screening mammogram for malignant neoplasm of breast: Secondary | ICD-10-CM

## 2019-09-27 ENCOUNTER — Ambulatory Visit: Payer: Medicare Other | Admitting: Internal Medicine

## 2019-09-27 ENCOUNTER — Telehealth: Payer: Self-pay | Admitting: Internal Medicine

## 2019-09-27 ENCOUNTER — Telehealth: Payer: Self-pay

## 2019-09-27 NOTE — Telephone Encounter (Signed)
Patient called asking if was okay to get her Covid booster injection and was advised per Dr. Sondra Come it was okay. Patient has an appt. on 9/25 for this at another office.

## 2019-09-27 NOTE — Telephone Encounter (Signed)
Pls contact pt [phone] regarding her getting the vaccine

## 2019-09-27 NOTE — Telephone Encounter (Signed)
Called pt - reiterated ok to get vaccine per Dr Daryll Drown. Stated she had talked to Dr Clabe Seal office who also told her it's ok.

## 2019-09-27 NOTE — Telephone Encounter (Signed)
Return pt's call - she wanted to know if it's ok to get the covid vaccine. I asked if she had any reaction to any vaccines; she stated "No". I told her it should be ok. Stated she's in remission and has completed radiation; informed she can call and ask him also.

## 2019-09-27 NOTE — Telephone Encounter (Signed)
She finished her XRT 3 years ago.  She should get the vaccine!  Thanks

## 2019-09-28 ENCOUNTER — Ambulatory Visit: Payer: Medicare Other | Admitting: Nurse Practitioner

## 2019-09-28 NOTE — Progress Notes (Deleted)
09/28/2019 DAKSHA KOONE [id number] [birth year]   Chief Complaint:  History of Present Illness: Dimitria Ketchum. Texeira is a 64 year old female with a past medical history of osteoarthritis, hypertension, endometrial cancer s/p chemo, radiation, hyperlipidemia, hypothyroidism, sleep apnea does not use cpap  Past Medical History:  Diagnosis Date  . Anemia   . Endometrial cancer ([place])   . Endometrial polyp   . GERD (gastroesophageal reflux disease)   . Heart murmur   . Heart palpitations   . History of blood transfusion   . History of chemotherapy   . History of radiation therapy   . History of uterine fibroid   . Hyperlipidemia   . Hypertension   . Hypothyroidism   . OSA (obstructive sleep apnea)    moderate OSA per study 06-21-2005--  per pt does need cpap anymore never purchased it  . Osteoarthritis   . PMB (postmenopausal bleeding)   . Wears dentures    upper  . Wears glasses      She was last seen in our office by Dr. [name] on 05/18/2019 for further evaluation for pill dysphagia, post nasal drip and throat irritation. Dry cough. ENT evaluation was recommended. Barium swallow if ENT evaluation unrevealing.   Colonoscopy 04/29/2016: - Diverticulosis in the sigmoid colon. - The examination was otherwise normal on direct and retroflexion views. - No specimens collected. - 10 year recall  CBC Latest Ref Rng & Units 05/10/2019 11/21/2018 03/17/2018  WBC 3.4 - 10.8 x10E3/uL 5.1 7.3 6.1  Hemoglobin 11.1 - 15.9 g/dL 12.2 13.1 12.5  Hematocrit 34.0 - 46.6 % 37.3 39.3 40.3  Platelets 150 - 450 x10E3/uL 238 248 198    CMP Latest Ref Rng & Units 06/05/2019 05/10/2019 04/07/2019  Glucose 65 - 99 mg/dL 107(H) 105(H) 100(H)  BUN 8 - 27 mg/dL 11 11 15   Creatinine 0.57 - 1.00 mg/dL 0.72 0.69 0.72  Sodium 134 - 144 mmol/L 143 141 139  Potassium 3.5 - 5.2 mmol/L 4.4 3.9 4.4  Chloride 96 - 106 mmol/L 105 103 100  CO2 20 - 29 mmol/L 21 23 21   Calcium 8.7 - 10.3 mg/dL 9.8 9.7 10.0  Total  Protein 6.0 - 8.5 g/dL - - -  Total Bilirubin 0.0 - 1.2 mg/dL - - -  Alkaline Phos 39 - 117 IU/L - - -  AST 0 - 40 IU/L - - -  ALT 0 - 32 IU/L - - -        Current Medications, Allergies, Past Medical History, Past Surgical History, Family History and Social History were reviewed in [employer] record.   Review of Systems:   Constitutional: Negative for fever, sweats, chills or weight loss.  Respiratory: Negative for shortness of breath.   Cardiovascular: Negative for chest pain, palpitations and leg swelling.  Gastrointestinal: See HPI.  Musculoskeletal: Negative for back pain or muscle aches.  Neurological: Negative for dizziness, headaches or paresthesias.    Physical Exam: LMP 07/16/2014  General: Well developed, w   ***female in no acute distress. Head: Normocephalic and atraumatic. Eyes: No scleral icterus. Conjunctiva pink . Ears: Normal auditory acuity. Mouth: Dentition intact. No ulcers or lesions.  Lungs: Clear throughout to auscultation. Heart: Regular rate and rhythm, no murmur. Abdomen: Soft, nontender and nondistended. No masses or hepatomegaly. Normal bowel sounds x 4 quadrants.  Rectal: *** Musculoskeletal: Symmetrical with no gross deformities. Extremities: No edema. Neurological: Alert oriented x 4. No focal deficits.  Psychological: Alert and cooperative. Normal  mood and affect  Assessment and Recommendations: ***

## 2019-10-05 DIAGNOSIS — K219 Gastro-esophageal reflux disease without esophagitis: Secondary | ICD-10-CM | POA: Insufficient documentation

## 2019-10-17 ENCOUNTER — Other Ambulatory Visit: Payer: Self-pay

## 2019-10-17 DIAGNOSIS — I10 Essential (primary) hypertension: Secondary | ICD-10-CM

## 2019-10-17 MED ORDER — METOPROLOL SUCCINATE ER 50 MG PO TB24: ORAL_TABLET | ORAL | 1 refills | Status: DC

## 2019-10-26 ENCOUNTER — Other Ambulatory Visit: Payer: Self-pay

## 2019-10-26 DIAGNOSIS — R1310 Dysphagia, unspecified: Secondary | ICD-10-CM

## 2019-10-26 MED ORDER — OMEPRAZOLE 20 MG PO CPDR: 20.0000 mg | DELAYED_RELEASE_CAPSULE | Freq: Every day | ORAL | 0 refills | Status: DC

## 2019-11-07 ENCOUNTER — Other Ambulatory Visit: Payer: Self-pay | Admitting: Cardiovascular Disease

## 2019-11-07 ENCOUNTER — Other Ambulatory Visit: Payer: Self-pay | Admitting: Internal Medicine

## 2019-11-07 NOTE — Telephone Encounter (Signed)
Patient will need follow up appointment scheduled.

## 2019-11-07 NOTE — Telephone Encounter (Signed)
Spoke with this patient. She has now sch an appt with Dr. Sherry Ruffing for 11/20/2019.

## 2019-11-20 ENCOUNTER — Ambulatory Visit (INDEPENDENT_AMBULATORY_CARE_PROVIDER_SITE_OTHER): Payer: Medicare Other | Admitting: Internal Medicine

## 2019-11-20 ENCOUNTER — Other Ambulatory Visit: Payer: Self-pay

## 2019-11-20 ENCOUNTER — Encounter: Payer: Self-pay | Admitting: Internal Medicine

## 2019-11-20 VITALS — BP 134/66 | HR 63 | Temp 98.3°F | Ht 60.0 in | Wt 194.4 lb

## 2019-11-20 DIAGNOSIS — Z6837 Body mass index (BMI) 37.0-37.9, adult: Secondary | ICD-10-CM | POA: Diagnosis not present

## 2019-11-20 DIAGNOSIS — R252 Cramp and spasm: Secondary | ICD-10-CM

## 2019-11-20 DIAGNOSIS — I10 Essential (primary) hypertension: Secondary | ICD-10-CM

## 2019-11-20 DIAGNOSIS — E669 Obesity, unspecified: Secondary | ICD-10-CM | POA: Diagnosis not present

## 2019-11-20 DIAGNOSIS — D539 Nutritional anemia, unspecified: Secondary | ICD-10-CM

## 2019-11-20 DIAGNOSIS — E785 Hyperlipidemia, unspecified: Secondary | ICD-10-CM

## 2019-11-20 DIAGNOSIS — E039 Hypothyroidism, unspecified: Secondary | ICD-10-CM

## 2019-11-20 NOTE — Assessment & Plan Note (Addendum)
Patient currently on 100 mcg Synthroid daily, this was decreased on her last visit due to TSH being 0.124 on last visit.  Denies any issues taking her medications.  Will repeat TSH today.  Addendum: TSH elevated at 6.6, mildly above goal. Will increase thyroid supplementation to 112 mcg daily from 100 mcg daily. RTC in 6 weeks for repeat TSH.

## 2019-11-20 NOTE — Assessment & Plan Note (Signed)
Patient was requesting referral to weight management [facility].  Her BMI is currently 37.97.  She has tried increasing her exercise, walking, decreasing her food intake including stopping eating at [employer] and decreasing processed foods. She multiple comorbidities including hypertension, osteoarthritis, OSA, and hyperlipidemia that could be related to her obesity.  We discussed monitoring her intake and tracking calories.  Given her multiple comorbidities referral for pain management clinic seems reasonable.  -Referral for weight management clinic

## 2019-11-20 NOTE — Assessment & Plan Note (Signed)
Patient is currently on spironolactone 25 mg daily, metoprolol 50 mg 3 times daily, and K-Dur 10 mEq daily.  Initial blood pressure mildly elevated at 153/56, repeat was 134/66.  Denies any issues taking her medications.    -Repeat BMP today -Continue spironolactone grams daily -Continue metoprolol 50 mg 3 times daily -Continue K-Dur 10 mEq daily

## 2019-11-20 NOTE — Progress Notes (Signed)
   CC: Hypothyroidism, obesity, and leg pain  HPI:  [name] is a 64 y.o. with the history listed below presenting for hypothyroidism, obesity, leg pain  Past Medical History:  Diagnosis Date  . Anemia   . Endometrial cancer ([place])   . Endometrial polyp   . GERD (gastroesophageal reflux disease)   . Heart murmur   . Heart palpitations   . History of blood transfusion   . History of chemotherapy   . History of radiation therapy   . History of uterine fibroid   . Hyperlipidemia   . Hypertension   . Hypothyroidism   . OSA (obstructive sleep apnea)    moderate OSA per study 06-21-2005--  per pt does need cpap anymore never purchased it  . Osteoarthritis   . PMB (postmenopausal bleeding)   . Wears dentures    upper  . Wears glasses    Review of Systems:   Constitutional: Negative for chills and fever.  Respiratory: Negative for shortness of breath.   Cardiovascular: Negative for chest pain and leg swelling.  Gastrointestinal: Negative for abdominal pain, nausea and vomiting.  Muskuloskeletal Neurological: Negative for , and headaches.   Physical Exam:  Vitals:   11/20/19 0847  BP: (!) 153/56  Pulse: 63  Temp: 98.3 F (36.8 C)  TempSrc: Oral  SpO2: 100%  Weight: 194 lb 6.4 oz (88.2 kg)  Height: 5' (1.524 m)   Physical Exam Constitutional:      Appearance: Normal appearance.  HENT:     Mouth/Throat:     Mouth: Mucous membranes are moist.     Pharynx: Oropharynx is clear.  Eyes:     Extraocular Movements: Extraocular movements intact.     Conjunctiva/sclera: Conjunctivae normal.     Pupils: Pupils are equal, round, and reactive to light.  Cardiovascular:     Rate and Rhythm: Normal rate and regular rhythm.     Pulses: Normal pulses.     Heart sounds: Normal heart sounds.  Pulmonary:     Effort: Pulmonary effort is normal.     Breath sounds: Normal breath sounds.  Abdominal:     General: Abdomen is flat. Bowel sounds are normal.     Palpations:  Abdomen is soft.  Musculoskeletal:        General: No swelling. Normal range of motion.  Skin:    General: Skin is warm and dry.     Capillary Refill: Capillary refill takes less than 2 seconds.  Neurological:     General: No focal deficit present.     Mental Status: She is alert and oriented to person, place, and time.  Psychiatric:        Mood and Affect: Mood normal.        Behavior: Behavior normal.     Assessment & Plan:   See Encounters Tab for problem based charting.  Patient discussed with Dr. Rebeca Alert

## 2019-11-20 NOTE — Patient Instructions (Addendum)
Lydia Walsh,  It was a pleasure to see you today. Thank you for coming in.   Today we discussed your blood pressure. In regards to this please continue taking your medications as prescribed. Your blood pressure looks good today.   We also discussed your weight. Please start monitoring your food intake. I have referred you to weight management.   We also discussed your leg pains, we are checking some labs to see if they are related to muscle injury, iron deficiency, or electrolyte abnormalities. You can continue using ibuprofen as needed for the pain. This could be due to restless leg syndrome, please start stretching every night before bed.   We are checking some thyroid labs and will contact you if they are abnormal.   Please return to clinic in 3 months or sooner if needed.   Thank you again for coming in.   [name] M.D.  Restless Legs Syndrome Restless legs syndrome is a condition that causes uncomfortable feelings or sensations in the legs, especially while sitting or lying down. The sensations usually cause an overwhelming urge to move the legs. The arms can also sometimes be affected. The condition can range from mild to severe. The symptoms often interfere with a person's ability to sleep. What are the causes? The cause of this condition is not known. What increases the risk? The following factors may make you more likely to develop this condition:  Being older than 50.  Pregnancy.  Being a woman. In general, the condition is more common in women than in men.  A family history of the condition.  Having iron deficiency.  Overuse of caffeine, nicotine, or alcohol.  Certain medical conditions, such as kidney disease, Parkinson's disease, or nerve damage.  Certain medicines, such as those for high blood pressure, nausea, colds, allergies, depression, and some heart conditions. What are the signs or symptoms? The main symptom of this condition is  uncomfortable sensations in the legs, such as:  Pulling.  Tingling.  Prickling.  Throbbing.  Crawling.  Burning. Usually, the sensations:  Affect both sides of the body.  Are worse when you sit or lie down.  Are worse at night. These may wake you up or make it difficult to fall asleep.  Make you have a strong urge to move your legs.  Are temporarily relieved by moving your legs. The arms can also be affected, but this is rare. People who have this condition often have tiredness during the day because of their lack of sleep at night. How is this diagnosed? This condition may be diagnosed based on:  Your symptoms.  Blood tests. In some cases, you may be monitored in a sleep lab by a specialist (a sleep study). This can detect any disruptions in your sleep. How is this treated? This condition is treated by managing the symptoms. This may include:  Lifestyle changes, such as exercising, using relaxation techniques, and avoiding caffeine, alcohol, or tobacco.  Medicines. Anti-seizure medicines may be tried first. Follow these instructions at home:     General instructions  Take over-the-counter and prescription medicines only as told by your health care provider.  Use methods to help relieve the uncomfortable sensations, such as: ? Massaging your legs. ? Walking or stretching. ? Taking a cold or hot bath.  Keep all follow-up visits as told by your health care provider. This is important. Lifestyle  Practice good sleep habits. For example, go to bed and get up at the same time every  day. Most adults should get 7-9 hours of sleep each night.  Exercise regularly. Try to get at least 30 minutes of exercise most days of the week.  Practice ways of relaxing, such as yoga or meditation.  Avoid caffeine and alcohol.  Do not use any products that contain nicotine or tobacco, such as cigarettes and e-cigarettes. If you need help quitting, ask your health care  provider. Contact a health care provider if:  Your symptoms get worse or they do not improve with treatment. Summary  Restless legs syndrome is a condition that causes uncomfortable feelings or sensations in the legs, especially while sitting or lying down.  The symptoms often interfere with a person's ability to sleep.  This condition is treated by managing the symptoms. You may need to make lifestyle changes or take medicines. This information is not intended to replace advice given to you by your health care provider. Make sure you discuss any questions you have with your health care provider. Document Revised: 01/18/2017 Document Reviewed: 01/18/2017 Elsevier Patient Education  [place].

## 2019-11-20 NOTE — Assessment & Plan Note (Signed)
Patient reports having bilateral thigh pain, she states that it has been going on for the past week, occurs in the evenings, last for approximately 1 hour.  Worsens with movement and activity.  She states she is concerned that it may be related to her rosuvastatin, it occurs a few hours after she takes her medications.  On chart review it appears that she has been having similar symptoms for a few months now, she was last seen on 06/05/2019 for similar symptoms, she had a CK and BMP checked at that time that were both unremarkable.  She was advised to hold her statin to see if problem persists and advised to resume if pain did not improve.  She is not sure if her symptoms resolved with stopping the medication.  Overall symptoms still do seem to be consistent with restless leg syndrome, will repeat CK and BMP today.  Discussed trialing pramipexole to see if this would help, patient reported she did not want to start any medication at this time.  -Check CK and BMP -Check iron and ferritin -Advised to perform stretches nightly, provided handout on restless leg syndrome -Consider pramipexole in future if symptoms persist

## 2019-11-21 LAB — IRON AND TIBC
Iron Saturation: 20 % (ref 15–55)
Iron: 68 ug/dL (ref 27–139)
Total Iron Binding Capacity: 345 ug/dL (ref 250–450)
UIBC: 277 ug/dL (ref 118–369)

## 2019-11-21 LAB — BMP8+ANION GAP
Anion Gap: 17 mmol/L (ref 10.0–18.0)
BUN/Creatinine Ratio: 20 (ref 12–28)
BUN: 14 mg/dL (ref 8–27)
CO2: 21 mmol/L (ref 20–29)
Calcium: 10 mg/dL (ref 8.7–10.3)
Chloride: 102 mmol/L (ref 96–106)
Creatinine, Ser: 0.7 mg/dL (ref 0.57–1.00)
GFR calc Af Amer: 104 mL/min/{1.73_m2} (ref >59)
GFR calc non Af Amer: 91 mL/min/{1.73_m2} (ref >59)
Glucose: 105 mg/dL — ABNORMAL HIGH (ref 65–99)
Potassium: 4.3 mmol/L (ref 3.5–5.2)
Sodium: 140 mmol/L (ref 134–144)

## 2019-11-21 LAB — LIPID PANEL
Chol/HDL Ratio: 3.1 ratio (ref 0.0–4.4)
Cholesterol, Total: 195 mg/dL (ref 100–199)
HDL: 62 mg/dL (ref >39)
LDL Chol Calc (NIH): 115 mg/dL — ABNORMAL HIGH (ref 0–99)
Triglycerides: 99 mg/dL (ref 0–149)
VLDL Cholesterol Cal: 18 mg/dL (ref 5–40)

## 2019-11-21 LAB — FERRITIN: Ferritin: 96 ng/mL (ref 15–150)

## 2019-11-21 LAB — CK: Total CK: 115 U/L (ref 32–182)

## 2019-11-21 LAB — TSH: TSH: 6.66 u[IU]/mL — ABNORMAL HIGH (ref 0.450–4.500)

## 2019-11-21 MED ORDER — LEVOTHYROXINE SODIUM 112 MCG PO TABS: 112.0000 ug | ORAL_TABLET | Freq: Every day | ORAL | 5 refills | Status: DC

## 2019-11-21 NOTE — Addendum Note (Signed)
Addended by: Asencion Noble on: 11/21/2019 03:55 PM   Modules accepted: Orders

## 2019-11-21 NOTE — Progress Notes (Signed)
Internal Medicine Clinic Attending  Case discussed with Dr. Krienke at the time of the visit.  We reviewed the resident's history and exam and pertinent patient test results.  I agree with the assessment, diagnosis, and plan of care documented in the resident's note.  Kinzlee Selvy, M.D., Ph.D.  

## 2019-11-30 ENCOUNTER — Other Ambulatory Visit: Payer: Self-pay | Admitting: Internal Medicine

## 2019-11-30 DIAGNOSIS — I10 Essential (primary) hypertension: Secondary | ICD-10-CM

## 2019-12-04 ENCOUNTER — Encounter: Payer: Self-pay | Admitting: *Deleted

## 2019-12-15 ENCOUNTER — Telehealth: Payer: Self-pay | Admitting: *Deleted

## 2020-01-01 ENCOUNTER — Encounter: Payer: Self-pay | Admitting: *Deleted

## 2020-01-02 ENCOUNTER — Telehealth: Payer: Self-pay

## 2020-01-02 NOTE — Telephone Encounter (Signed)
Pls contact pt regarding medicine [phone]

## 2020-01-02 NOTE — Telephone Encounter (Signed)
RTC, patient states she was placed on cephalexin by her urologist for UTI and she wanted  To make sure it would not react with her other medications.  However, patient has since called her pharmacist and was told by pharmacist that there was no drug interactions and to continue medication. SChaplin, RN,BSN

## 2020-01-08 ENCOUNTER — Other Ambulatory Visit: Payer: Self-pay | Admitting: Internal Medicine

## 2020-01-08 DIAGNOSIS — E876 Hypokalemia: Secondary | ICD-10-CM

## 2020-02-03 ENCOUNTER — Other Ambulatory Visit: Payer: Self-pay | Admitting: Internal Medicine

## 2020-02-03 DIAGNOSIS — R1310 Dysphagia, unspecified: Secondary | ICD-10-CM

## 2020-02-08 ENCOUNTER — Ambulatory Visit (INDEPENDENT_AMBULATORY_CARE_PROVIDER_SITE_OTHER): Payer: Medicare Other | Admitting: Family Medicine

## 2020-02-08 ENCOUNTER — Other Ambulatory Visit: Payer: Self-pay

## 2020-02-08 ENCOUNTER — Encounter (INDEPENDENT_AMBULATORY_CARE_PROVIDER_SITE_OTHER): Payer: Self-pay | Admitting: Family Medicine

## 2020-02-08 VITALS — BP 131/75 | HR 63 | Temp 98.3°F | Ht 59.0 in | Wt 189.0 lb

## 2020-02-08 DIAGNOSIS — R0602 Shortness of breath: Secondary | ICD-10-CM | POA: Diagnosis not present

## 2020-02-08 DIAGNOSIS — R739 Hyperglycemia, unspecified: Secondary | ICD-10-CM

## 2020-02-08 DIAGNOSIS — E039 Hypothyroidism, unspecified: Secondary | ICD-10-CM

## 2020-02-08 DIAGNOSIS — Z6838 Body mass index (BMI) 38.0-38.9, adult: Secondary | ICD-10-CM | POA: Diagnosis not present

## 2020-02-08 DIAGNOSIS — R5383 Other fatigue: Secondary | ICD-10-CM

## 2020-02-08 DIAGNOSIS — Z1331 Encounter for screening for depression: Secondary | ICD-10-CM

## 2020-02-08 DIAGNOSIS — Z0289 Encounter for other administrative examinations: Secondary | ICD-10-CM

## 2020-02-08 DIAGNOSIS — E559 Vitamin D deficiency, unspecified: Secondary | ICD-10-CM

## 2020-02-08 DIAGNOSIS — I10 Essential (primary) hypertension: Secondary | ICD-10-CM | POA: Diagnosis not present

## 2020-02-08 DIAGNOSIS — E782 Mixed hyperlipidemia: Secondary | ICD-10-CM

## 2020-02-13 NOTE — Progress Notes (Signed)
Chief Complaint:   OBESITY Lydia Walsh (MR# [id number]) is a 65 y.o. female who presents for evaluation and treatment of obesity and related comorbidities. Current BMI is Body mass index is 38.17 kg/m. Lydia Walsh has been struggling with her weight for many years and has been unsuccessful in either losing weight, maintaining weight loss, or reaching her healthy weight goal.  Lydia Walsh is currently in the action stage of change and ready to dedicate time achieving and maintaining a healthier weight. Lydia Walsh is interested in becoming our patient and working on intensive lifestyle modifications including (but not limited to) diet and exercise for weight loss.  Lydia Walsh's habits were reviewed today and are as follows: Her family eats meals together, she thinks her family will eat healthier with her, her desired weight loss is 69 lbs, she started gaining weight after her third child, she has significant food cravings issues, she snacks frequently in the evenings, she skips meals frequently, she is frequently drinking liquids with calories, she frequently makes poor food choices, she has problems with excessive hunger, she frequently eats larger portions than normal and she struggles with emotional eating.  Depression Screen Lydia Walsh's Food and Mood (modified PHQ-9) score was 16.  Depression screen PHQ 2/9 02/08/2020  Decreased Interest 1  Down, Depressed, Hopeless 3  PHQ - 2 Score 4  Altered sleeping 3  Tired, decreased energy 3  Change in appetite 2  Feeling bad or failure about yourself  0  Trouble concentrating 2  Moving slowly or fidgety/restless 2  Suicidal thoughts 0  PHQ-9 Score 16  Difficult doing work/chores -  Some recent data might be hidden   Subjective:   1. Other fatigue Lydia Walsh admits to daytime somnolence and admits to waking up still tired. Patent has a history of symptoms of daytime fatigue. Lydia Walsh generally gets 3 hours of sleep per night, and states that she has difficulty falling  asleep. Snoring is not present. Apneic episodes are not present. Epworth Sleepiness Score is 2.  2. Shortness of breath on exertion Lydia Walsh notes increasing shortness of breath with exercising and seems to be worsening over time with weight gain. She notes getting out of breath sooner with activity than she used to. This has not gotten worse recently. Lydia Walsh denies shortness of breath at rest or orthopnea.  3. Primary hypertension Lydia Walsh blood pressure is controlled today on her medications. She is ready to work on diet and weight loss. She denies chest pain.  4. Mixed hyperlipidemia Lydia Walsh is on statin, and she is ready to work on diet and weight loss.  5. Hyperglycemia Lydia Walsh has a history of elevated glucose readings. She denies a history of diabetes mellitus   6. Vitamin D deficiency Lydia Walsh is on multivitamins, and she has no recent Vit D level. She is at risk of Vit D deficiency.  7. Hypothyroidism, unspecified type Lydia Walsh is on levothyroxine 112 mcg daily. She notes fatigue, and she is struggling with weight loss.  Assessment/Plan:   1. Other fatigue Laleh does feel that her weight is causing her energy to be lower than it should be. Fatigue may be related to obesity, depression or many other causes. Labs will be ordered, and in the meanwhile, Makenley will focus on self care including making healthy food choices, increasing physical activity and focusing on stress reduction.  - CBC with Differential/Platelet - EKG 12-Lead  2. Shortness of breath on exertion Lydia Walsh does feel that she gets out of breath more easily that she  used to when she exercises. Maureena's shortness of breath appears to be obesity related and exercise induced. She has agreed to work on weight loss and gradually increase exercise to treat her exercise induced shortness of breath. Will continue to monitor closely.  3. Primary hypertension Lydia Walsh will start her Category 2 plan, and will continue working on healthy weight loss and  exercise to improve blood pressure control. We will check labs today. We will watch for signs of hypotension as she continues her lifestyle modifications.  - Comprehensive metabolic panel  4. Mixed hyperlipidemia Cardiovascular risk and specific lipid/LDL goals reviewed. We discussed several lifestyle modifications today. We will check labs today. Lydia Walsh will start her Category 2 plan, and will continue to work on exercise and weight loss efforts. Orders and follow up as documented in patient record.   - Lipid Panel With LDL/HDL Ratio  5. Hyperglycemia Fasting labs will be obtained today, and results with be discussed with Lydia Walsh in 2 weeks at her follow up visit. In the meanwhile Lydia Walsh will start her Category 2 plan and will work on weight loss efforts.  - Hemoglobin A1c - Insulin, random  6. Vitamin D deficiency Low Vitamin D level contributes to fatigue and are associated with obesity, breast, and colon cancer. We will check labs today. Lydia Walsh will follow-up for routine testing of Vitamin D, at least 2-3 times per year to avoid over-replacement.  - VITAMIN D 25 Hydroxy (Vit-D Deficiency, Fractures)  7. Hypothyroidism, unspecified type We will check labs today. Lydia Walsh will continue levothyroxine, and will follow up as directed. Orders and follow up as documented in patient record.  - T3 - T4 - TSH  8. Screening for depression Lydia Walsh had a positive depression screening. Depression is commonly associated with obesity and often results in emotional eating behaviors. We will monitor this closely and work on CBT to help improve the non-hunger eating patterns. Referral to Psychology may be required if no improvement is seen as she continues in our clinic.  9. Class 2 severe obesity with serious comorbidity and body mass index (BMI) of 38.0 to 38.9 in adult, unspecified obesity type [facility]) Lydia Walsh is currently in the action stage of change and her goal is to continue with weight loss efforts. I recommend  Lydia Walsh begin the structured treatment plan as follows:  She has agreed to the Category 2 Plan.  Exercise goals: No exercise has been prescribed for now, while we concentrate on nutritional changes.  Behavioral modification strategies: no skipping meals and meal planning and cooking strategies.  She was informed of the importance of frequent follow-up visits to maximize her success with intensive lifestyle modifications for her multiple health conditions. She was informed we would discuss her lab results at her next visit unless there is a critical issue that needs to be addressed sooner. Lydia Walsh agreed to keep her next visit at the agreed upon time to discuss these results.  Objective:   Blood pressure 131/75, pulse 63, temperature 98.3 F (36.8 C), height 4\' 11"  (1.499 m), weight 189 lb (85.7 kg), last menstrual period 07/16/2014, SpO2 97 %. Body mass index is 38.17 kg/m.  EKG: Normal sinus rhythm, rate 69 BPM.  Indirect Calorimeter completed today shows a VO2 of 211 and a REE of 1469.  Her calculated basal metabolic rate is 6962 thus her basal metabolic rate is better than expected.  General: Cooperative, alert, well developed, in no acute distress. HEENT: Conjunctivae and lids unremarkable. Cardiovascular: Regular rhythm.  Lungs: Normal work of  breathing. Neurologic: No focal deficits.   Lab Results  Component Value Date   CREATININE 0.70 11/20/2019   BUN 14 11/20/2019   NA 140 11/20/2019   K 4.3 11/20/2019   CL 102 11/20/2019   CO2 21 11/20/2019   Lab Results  Component Value Date   ALT 22 09/12/2018   AST 22 09/12/2018   ALKPHOS 106 09/12/2018   BILITOT 0.4 09/12/2018   Lab Results  Component Value Date   HGBA1C 5.6 11/02/2011   No results found for: INSULIN Lab Results  Component Value Date   TSH 6.660 (H) 11/20/2019   Lab Results  Component Value Date   CHOL 195 11/20/2019   HDL 62 11/20/2019   LDLCALC 115 (H) 11/20/2019   TRIG 99 11/20/2019   CHOLHDL 3.1  11/20/2019   Lab Results  Component Value Date   WBC 5.1 05/10/2019   HGB 12.2 05/10/2019   HCT 37.3 05/10/2019   MCV 87 05/10/2019   PLT 238 05/10/2019   Lab Results  Component Value Date   IRON 68 11/20/2019   TIBC 345 11/20/2019   FERRITIN 96 11/20/2019   Obesity Behavioral Intervention:   Approximately 15 minutes were spent on the discussion below.  ASK: We discussed the diagnosis of obesity with Lydia Walsh today and Margaretmary agreed to give [place] permission to discuss obesity behavioral modification therapy today.  ASSESS: Amanee has the diagnosis of obesity and her BMI today is 38.73. Tuwanda is in the action stage of change.   ADVISE: Emary was educated on the multiple health risks of obesity as well as the benefit of weight loss to improve her health. She was advised of the need for long term treatment and the importance of lifestyle modifications to improve her current health and to decrease her risk of future health problems.  AGREE: Multiple dietary modification options and treatment options were discussed and Jinnifer agreed to follow the recommendations documented in the above note.  ARRANGE: Lafern was educated on the importance of frequent visits to treat obesity as outlined per CMS and USPSTF guidelines and agreed to schedule her next follow up appointment today.  Attestation Statements:   Reviewed by clinician on day of visit: allergies, medications, problem list, medical history, surgical history, family history, social history, and previous encounter notes.   I, Trixie Dredge, am acting as transcriptionist for Dennard Nip, MD.  I have reviewed the above documentation for accuracy and completeness, and I agree with the above. - Dennard Nip, MD

## 2020-02-22 ENCOUNTER — Other Ambulatory Visit: Payer: Self-pay

## 2020-02-22 ENCOUNTER — Ambulatory Visit (INDEPENDENT_AMBULATORY_CARE_PROVIDER_SITE_OTHER): Payer: BLUE CROSS/BLUE SHIELD | Admitting: Family Medicine

## 2020-02-22 ENCOUNTER — Telehealth: Payer: Self-pay

## 2020-02-22 ENCOUNTER — Ambulatory Visit (INDEPENDENT_AMBULATORY_CARE_PROVIDER_SITE_OTHER): Payer: Medicare Other | Admitting: Student

## 2020-02-22 DIAGNOSIS — R197 Diarrhea, unspecified: Secondary | ICD-10-CM

## 2020-02-22 NOTE — Progress Notes (Signed)
[facility] Telephone Encounter Continuity Care Appointment  HPI:   This telephone encounter was created for [name] on 02/22/2020 for the following purpose/cc: diarrhea.  Patient complaining of watery, non-bloody diarrhea for the past few weeks. She reports that it is associated with a crampy abdominal pain that improves after defecation. States that she has about 3 bowel movements during the day and 2-3 bowel movements at night that wake her up from her sleep. She also reports that her diarrhea appeared to get worse after taking omeprazole yesterday night, after which she had an episode of fecal incontinence. She also states that she drinks a lot of coffee, which I advised can increase gut motility. She reports that she tried eating more cheese lately to see if it would help her have more formed stools, but her diarrhea has gotten worse since. Denies fevers, chills, vomiting, SHOB, chest pain, constipation, urinary frequency, dysuria. She does endorse one episode of nausea.  Reports that she saw a urologist, Dr. Tresa Moore?, who told her that she has IBS. She began to take OTC imodium which helped with her diarrhea initially, but it has become ineffective as of late.   I discussed with the patient that at this time, the exact etiology of her diarrhea is unclear. Her synthroid dose was increased in November 2021 for better control of her hypothyroidism, but she did not follow up for a repeat TSH afterwards to see if it within the normal range. She does endorse long-term omeprazole use, which is somewhat concerning for possible C diff-associated diarrheal illness. This could certainly be diarrhea-prominent IBS, but it would be unusual to have nocturnal diarrheal episodes.  I explained to her to come in for an in-person clinic visit to further evaluate causes for her diarrhea. She agrees to come in tomorrow morning as she does not have any transportation today. Until  her appointment, I advised to avoid imodium today, to limit coffee intake, and to increase water intake (2-3L). I also advised to avoid dairy products for about 1 week and see if this helps with her symptoms. She confirms understanding  Will check TSH, CBC (check for infection), BMP (check for electrolyte abnormalities), GI path panel, and possibly a stool antigen test for C diff.   Past Medical History:  Past Medical History:  Diagnosis Date  . Anemia   . Back pain   . Endometrial cancer ([place])   . Endometrial polyp   . GERD (gastroesophageal reflux disease)   . Heart murmur   . Heart palpitations   . History of blood transfusion   . History of chemotherapy   . History of radiation therapy   . History of uterine fibroid   . Hyperlipidemia   . Hypertension   . Hypothyroidism   . IBS (irritable bowel syndrome)   . Joint pain   . OSA (obstructive sleep apnea)    moderate OSA per study 06-21-2005--  per pt does need cpap anymore never purchased it  . Osteoarthritis   . Other fatigue   . Other specified disorders of thyroid   . PMB (postmenopausal bleeding)   . Shortness of breath on exertion   . Wears dentures    upper  . Wears glasses       ROS:   Please see above.   Assessment / Plan / Recommendations:   Please see A&P under problem oriented charting for assessment of the patient's acute and chronic medical conditions.   As always, pt  is advised that if symptoms worsen or new symptoms arise, they should go to an urgent care facility or to to ER for further evaluation.   Consent and Medical Decision Making:   Patient discussed with Dr. Jimmye Norman  This is a telephone encounter between Lydia Walsh and Virl Axe on 02/22/2020 for diarrhea. The visit was conducted with the patient located at home and Virl Axe at [facility]. The patient's identity was confirmed using their DOB and current address. The patient has consented to being evaluated through a telephone encounter  and understands the associated risks (an examination cannot be done and the patient may need to come in for an appointment) / benefits (allows the patient to remain at home, decreasing exposure to coronavirus). I personally spent 29 minutes on medical discussion.

## 2020-02-22 NOTE — Telephone Encounter (Signed)
Received TC from patient who states she is having diarrhea for a couple of days.  Denies fever,no abdominal pain currently, states she did have some abdominal pain last night.  She is eating and drinking.  She is asking for an appt, but states she cannot drive here d/t the diarrhea.  Pt states she has been dx'd with IBS, reports taking 1 dose of imodium day before yesterday.  Telephone appt given for today/red team/Dr. Allyson Sabal at 1:15.  RN informed patient that we can start with telephone appt, but she was made aware that after MD speaks to her, he may suggest in-person visit for evaluation.  She verbalized understanding. SChaplin, RN,BSN

## 2020-02-23 ENCOUNTER — Other Ambulatory Visit: Payer: Self-pay

## 2020-02-23 ENCOUNTER — Encounter: Payer: Self-pay | Admitting: Student

## 2020-02-23 ENCOUNTER — Ambulatory Visit (INDEPENDENT_AMBULATORY_CARE_PROVIDER_SITE_OTHER): Payer: Medicare Other | Admitting: Student

## 2020-02-23 VITALS — BP 142/82 | HR 90 | Temp 98.6°F | Ht 59.0 in | Wt 188.7 lb

## 2020-02-23 DIAGNOSIS — E039 Hypothyroidism, unspecified: Secondary | ICD-10-CM | POA: Diagnosis present

## 2020-02-23 DIAGNOSIS — R197 Diarrhea, unspecified: Secondary | ICD-10-CM | POA: Diagnosis not present

## 2020-02-23 NOTE — Assessment & Plan Note (Signed)
Patient coming in for an in person clinic visit following telephone encounter for watery diarrhea. Please see my previous note for full discussion of this problem. No acute change in her symptoms. She is still having watery diarrhea as per my note from yesterday.  Today, will check TSH to make sure she is not in a hyperthyroid state which could cause diarrhea. Also will check CBC for infection, BMP to assess electrolytes, and a GI path panel to exclude infectious source of diarrhea. She does not seem very ill on exam, so will hold off on testing for C diff at this time.   Advised patient to increase oral fluid intake (2-3L per day) and to avoid dairy products for 1-2 weeks (to make sure this is not from lactose intolerance/malabsorption).

## 2020-02-23 NOTE — Assessment & Plan Note (Deleted)
Patient complaining of watery, non-bloody diarrhea for the past few weeks. She reports that it is associated with a crampy abdominal pain that improves after defecation. States that she has about 3 bowel movements during the day and 2-3 bowel movements at night that wake her up from her sleep. She also reports that her diarrhea appeared to get worse after taking omeprazole yesterday night, after which she had an episode of fecal incontinence. She also states that she drinks a lot of coffee, which I advised can increase gut motility. She reports that she tried eating more cheese lately to see if it would help her have more formed stools, but her diarrhea has gotten worse since. Denies fevers, chills, vomiting, SHOB, chest pain, constipation, urinary frequency, dysuria. She does endorse one episode of nausea.  Reports that she saw a urologist, Dr. Tresa Moore?, who told her that she has IBS. She began to take OTC imodium which helped with her diarrhea initially, but it has become ineffective as of late.   I discussed with the patient that at this time, the exact etiology of her diarrhea is unclear. Her synthroid dose was increased in November 2021 for better control of her hypothyroidism, but she did not follow up for a repeat TSH afterwards to see if it within the normal range. She does endorse long-term omeprazole use, which is somewhat concerning for possible C diff-associated diarrheal illness. This could certainly be diarrhea-prominent IBS, but it would be unusual to have nocturnal diarrheal episodes.  I explained to her to come in for an in-person clinic visit to further evaluate causes for her diarrhea. She agrees to come in tomorrow morning as she does not have any transportation today. Until her appointment, I advised to avoid imodium today, to limit coffee intake, and to increase water intake (2-3L). I also advised to avoid dairy products for about 1 week and see if this helps with her symptoms. She confirms  understanding  Will check TSH, CBC (check for infection), BMP (check for electrolyte abnormalities), GI path panel, and possibly a stool antigen test for C diff

## 2020-02-23 NOTE — Assessment & Plan Note (Signed)
Patient complaining of watery, non-bloody diarrhea for the past few weeks. She reports that it is associated with a crampy abdominal pain that improves after defecation. States that she has about 3 bowel movements during the day and 2-3 bowel movements at night that wake her up from her sleep. She also reports that her diarrhea appeared to get worse after taking omeprazole yesterday night, after which she had an episode of fecal incontinence. She also states that she drinks a lot of coffee, which I advised can increase gut motility. She reports that she tried eating more cheese lately to see if it would help her have more formed stools, but her diarrhea has gotten worse since. Denies fevers, chills, vomiting, SHOB, chest pain, constipation, urinary frequency, dysuria. She does endorse one episode of nausea.  Reports that she saw a urologist, Dr. Tresa Moore?, who told her that she has IBS. She began to take OTC imodium which helped with her diarrhea initially, but it has become ineffective as of late.   I discussed with the patient that at this time, the exact etiology of her diarrhea is unclear. Her synthroid dose was increased in November 2021 for better control of her hypothyroidism, but she did not follow up for a repeat TSH afterwards to see if it within the normal range. She does endorse long-term omeprazole use, which is somewhat concerning for possible C diff-associated diarrheal illness. This could certainly be diarrhea-prominent IBS, but it would be unusual to have nocturnal diarrheal episodes.  I explained to her to come in for an in-person clinic visit to further evaluate causes for her diarrhea. She agrees to come in tomorrow morning as she does not have any transportation today. Until her appointment, I advised to avoid imodium today, to limit coffee intake, and to increase water intake (2-3L). I also advised to avoid dairy products for about 1 week and see if this helps with her symptoms. She confirms  understanding  Will check TSH, CBC (check for infection), BMP (check for electrolyte abnormalities), GI path panel, and possibly a stool antigen test for C diff

## 2020-02-23 NOTE — Progress Notes (Signed)
   CC: watery diarrhea  HPI:  [name] is a 65 y.o. F with history listed below presenting to the [facility] for an in-person visit following telephone encounter for watery diarrhea. Please see individualized problem based charting for full HPI.  Past Medical History:  Diagnosis Date  . Anemia   . Back pain   . Endometrial cancer ([place])   . Endometrial polyp   . GERD (gastroesophageal reflux disease)   . Heart murmur   . Heart palpitations   . History of blood transfusion   . History of chemotherapy   . History of radiation therapy   . History of uterine fibroid   . Hyperlipidemia   . Hypertension   . Hypothyroidism   . IBS (irritable bowel syndrome)   . Joint pain   . OSA (obstructive sleep apnea)    moderate OSA per study 06-21-2005--  per pt does need cpap anymore never purchased it  . Osteoarthritis   . Other fatigue   . Other specified disorders of thyroid   . PMB (postmenopausal bleeding)   . Shortness of breath on exertion   . Wears dentures    upper  . Wears glasses     Review of Systems:  Negative aside from that listed in individualized problem based charting.  Physical Exam:  Vitals:   02/23/20 0907  BP: (!) 142/82  Pulse: 90  Temp: 98.6 F (37 C)  TempSrc: Oral  SpO2: 99%  Weight: 188 lb 11.2 oz (85.6 kg)  Height: 4\' 11"  (1.499 m)   Physical Exam Constitutional:      Appearance: She is obese. She is not ill-appearing.  HENT:     Nose: Nose normal. No congestion.     Mouth/Throat:     Mouth: Mucous membranes are dry.     Pharynx: Oropharynx is clear. No oropharyngeal exudate.  Eyes:     Extraocular Movements: Extraocular movements intact.     Conjunctiva/sclera: Conjunctivae normal.     Pupils: Pupils are equal, round, and reactive to light.  Cardiovascular:     Rate and Rhythm: Normal rate and regular rhythm.     Pulses: Normal pulses.     Heart sounds: Normal heart sounds. No murmur heard. No friction rub. No gallop.   Pulmonary:      Effort: Pulmonary effort is normal.     Breath sounds: Normal breath sounds. No wheezing, rhonchi or rales.  Abdominal:     General: There is no distension.     Palpations: Abdomen is soft.     Tenderness: There is no abdominal tenderness. There is no guarding or rebound.     Comments: Hyperactive bowel sounds  Musculoskeletal:        General: No swelling. Normal range of motion.     Cervical back: Normal range of motion.  Skin:    General: Skin is warm and dry.     Capillary Refill: Capillary refill takes less than 2 seconds.  Neurological:     General: No focal deficit present.     Mental Status: She is alert and oriented to person, place, and time.  Psychiatric:        Mood and Affect: Mood normal.        Behavior: Behavior normal.      Assessment & Plan:   See Encounters Tab for problem based charting.  Patient discussed with Dr. Jimmye Norman

## 2020-02-23 NOTE — Addendum Note (Signed)
Addended by: Truddie Crumble on: 02/23/2020 10:26 AM   Modules accepted: Orders

## 2020-02-23 NOTE — Patient Instructions (Signed)
Ms Lydia Walsh,  It was a pleasure seeing you in the clinic today.   We discussed the following today:  1. Please drink more water (2-3 liters per day).  2. Please avoid dairy products for 1-2 weeks to see if this helps with your diarrhea (no cheese, milk, etc.).  3. We are getting some labs today. I will call you if any are abnormal.  Please call our clinic at [phone] if you have any questions or concerns. The best time to call is Monday-Friday from 9am-4pm, but there is someone available 24/7 at the same number. If you need medication refills, please notify your pharmacy one week in advance and they will send [place] a request.   Thank you for letting us take part in your care. We look forward to seeing you next time!

## 2020-02-25 LAB — CBC WITH DIFFERENTIAL/PLATELET
Basophils Absolute: 0 10*3/uL (ref 0.0–0.2)
Basos: 0 %
EOS (ABSOLUTE): 0.1 10*3/uL (ref 0.0–0.4)
Eos: 2 %
Hematocrit: 41.3 % (ref 34.0–46.6)
Hemoglobin: 13.7 g/dL (ref 11.1–15.9)
Immature Grans (Abs): 0 10*3/uL (ref 0.0–0.1)
Immature Granulocytes: 0 %
Lymphocytes Absolute: 1 10*3/uL (ref 0.7–3.1)
Lymphs: 21 %
MCH: 29 pg (ref 26.6–33.0)
MCHC: 33.2 g/dL (ref 31.5–35.7)
MCV: 87 fL (ref 79–97)
Monocytes Absolute: 0.3 10*3/uL (ref 0.1–0.9)
Monocytes: 7 %
Neutrophils Absolute: 3.5 10*3/uL (ref 1.4–7.0)
Neutrophils: 70 %
Platelets: 249 10*3/uL (ref 150–450)
RBC: 4.73 x10E6/uL (ref 3.77–5.28)
RDW: 13.9 % (ref 11.7–15.4)
WBC: 5 10*3/uL (ref 3.4–10.8)

## 2020-02-25 LAB — BMP8+ANION GAP
Anion Gap: 19 mmol/L — ABNORMAL HIGH (ref 10.0–18.0)
BUN/Creatinine Ratio: 24 (ref 12–28)
BUN: 25 mg/dL (ref 8–27)
CO2: 14 mmol/L — ABNORMAL LOW (ref 20–29)
Calcium: 9.7 mg/dL (ref 8.7–10.3)
Chloride: 103 mmol/L (ref 96–106)
Creatinine, Ser: 1.06 mg/dL — ABNORMAL HIGH (ref 0.57–1.00)
GFR calc Af Amer: 63 mL/min/{1.73_m2} (ref >59)
GFR calc non Af Amer: 55 mL/min/{1.73_m2} — ABNORMAL LOW (ref >59)
Glucose: 120 mg/dL — ABNORMAL HIGH (ref 65–99)
Potassium: 4.2 mmol/L (ref 3.5–5.2)
Sodium: 136 mmol/L (ref 134–144)

## 2020-02-25 LAB — TSH: TSH: 2.29 u[IU]/mL (ref 0.450–4.500)

## 2020-02-25 NOTE — Progress Notes (Signed)
Internal Medicine Clinic Attending  Case discussed with Dr. Allyson Sabal at the time of the visit.  We reviewed the resident's history and exam and pertinent patient test results.  I agree with the assessment, diagnosis, and plan of care documented in the resident's note. Chronicity helps rule out many causes of acute diarrhea, and infectious cause is not likely.  On the differential inlcude IBS and post-radiation enteritis (which can happen months to years after XRT; her last tx was about 3 yrs ago - she has f/u with RadOnc Dr. Sondra Come next month).

## 2020-02-27 LAB — GI PROFILE, STOOL, PCR

## 2020-02-28 ENCOUNTER — Other Ambulatory Visit: Payer: Self-pay | Admitting: Cardiovascular Disease

## 2020-03-01 ENCOUNTER — Other Ambulatory Visit: Payer: Self-pay | Admitting: *Deleted

## 2020-03-01 ENCOUNTER — Telehealth: Payer: Self-pay | Admitting: *Deleted

## 2020-03-01 DIAGNOSIS — I10 Essential (primary) hypertension: Secondary | ICD-10-CM

## 2020-03-01 MED ORDER — METOPROLOL SUCCINATE ER 50 MG PO TB24: ORAL_TABLET | ORAL | 1 refills | Status: DC

## 2020-03-01 NOTE — Telephone Encounter (Signed)
Robert from [place] called in stating patient's insurance will not pay for 3 tabs of metoprolol 50 mg but will pay for 1.5 tabs of 100 mg. Verbal auth given. Will route to Red Team for agreement/denial. Hubbard Hartshorn, BSN, RN-BC

## 2020-03-01 NOTE — Telephone Encounter (Signed)
Agree with plan, thanks

## 2020-03-01 NOTE — Telephone Encounter (Signed)
Patient called in stating [facility] has been waiting for doctor to sign off on her metoprolol. Will forward refill request to Red Team L. Rhythm Gubbels, BSN, RN-BC

## 2020-03-01 NOTE — Telephone Encounter (Signed)
Patient notified that refill has been sent. She is very Patent attorney. Hubbard Hartshorn, BSN, RN-BC

## 2020-03-01 NOTE — Progress Notes (Signed)
Internal Medicine Clinic Attending  Case discussed with Dr. Jinwala  At the time of the visit.  We reviewed the resident's history and exam and pertinent patient test results.  I agree with the assessment, diagnosis, and plan of care documented in the resident's note.  

## 2020-03-07 ENCOUNTER — Ambulatory Visit (INDEPENDENT_AMBULATORY_CARE_PROVIDER_SITE_OTHER): Payer: Medicare Other | Admitting: Bariatrics

## 2020-03-07 ENCOUNTER — Encounter: Payer: Self-pay | Admitting: *Deleted

## 2020-03-11 ENCOUNTER — Ambulatory Visit
Admission: RE | Admit: 2020-03-11 | Discharge: 2020-03-11 | Disposition: A | Discharge status: Home / Self Care | Payer: Medicare Other | Source: Ambulatory Visit | Attending: Radiation Oncology | Admitting: Radiation Oncology

## 2020-03-11 ENCOUNTER — Other Ambulatory Visit: Payer: Self-pay

## 2020-03-11 ENCOUNTER — Encounter: Payer: Self-pay | Admitting: Radiation Oncology

## 2020-03-11 VITALS — BP 147/56 | HR 66 | Temp 97.9°F | Resp 20 | Ht 59.0 in | Wt 193.2 lb

## 2020-03-11 DIAGNOSIS — Z79899 Other long term (current) drug therapy: Secondary | ICD-10-CM | POA: Insufficient documentation

## 2020-03-11 DIAGNOSIS — Z923 Personal history of irradiation: Secondary | ICD-10-CM | POA: Diagnosis not present

## 2020-03-11 DIAGNOSIS — Z8542 Personal history of malignant neoplasm of other parts of uterus: Secondary | ICD-10-CM | POA: Insufficient documentation

## 2020-03-11 DIAGNOSIS — C541 Malignant neoplasm of endometrium: Secondary | ICD-10-CM

## 2020-03-11 NOTE — Progress Notes (Signed)
Radiation Oncology         [phone] ________________________________  Name: Lydia Walsh MRN: [id number]  Date: 03/11/2020  DOB: [birth year]    Follow-Up Visit Note  CC: Asencion Noble, MD  Asencion Noble, MD    ICD-10-CM   1. International Federation 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 ([place])  C54.1   2. Endometrial cancer ([place])  C54.1     Diagnosis: Stage IIIC-1 grade 2 endometrioid adenocarcinoma    Interval Since Last Radiation: Four years, four months, two weeks, and two days  1) 09/02/2015 - 10/08/2015: External beam radiation to the pelvis - 45 Gy in 25 fractions, simultaneous integrated boost to suspicious node 50 Gy  2) 10/15/2015, 10/22/2015, 10/24/2015 - HDR vaginal brachytherapy - 18 Gy in 3 fractions.  Narrative: The patient returns today for routine follow-up. Since her last visit, she underwent a screening mammogram on 09/13/2019 that did not show any mammographic evidence of malignancy. No other significant interval history.  On review of systems, she reports feeling well without any new medical issues. She denies pelvic pain vaginal bleeding or abdominal bloating.  ALLERGIES:  is allergic to shrimp (diagnostic) and hctz [hydrochlorothiazide].  Meds: Current Outpatient Medications  Medication Sig Dispense Refill  . allopurinol (ZYLOPRIM) 100 MG tablet Take 1 tablet by mouth once daily 90 tablet 0  . fluticasone (FLONASE) 50 MCG/ACT nasal spray Spray 1-2 sprays per nostril per day as needed for treatment of allergies. 16 g 5  . levothyroxine (EUTHYROX) 112 MCG tablet Take 1 tablet (112 mcg total) by mouth daily. 30 tablet 5  . metoprolol succinate (TOPROL-XL) 50 MG 24 hr tablet TAKE 3 TABLETS BY MOUTH ONCE DAILY WITH MEALS OR  IMMEDIATELY  FOLLOWING (Patient taking differently: TAKE 1 1/2 TABLETS BY MOUTH ONCE DAILY WITH MEALS OR  IMMEDIATELY  FOLLOWING) 270 tablet 1  . Multiple Vitamins-Minerals (CENTRUM  PO) Take by mouth.    Marland Kitchen omeprazole (PRILOSEC) 20 MG capsule Take 1 capsule by mouth once daily 90 capsule 0  . potassium chloride (KLOR-CON) 10 MEQ tablet Take 1 tablet by mouth once daily 90 tablet 0  . rosuvastatin (CRESTOR) 20 MG tablet Take 1 tablet by mouth once daily 30 tablet 0  . spironolactone (ALDACTONE) 25 MG tablet Take 3 tablets by mouth once daily 270 tablet 1   No current facility-administered medications for this encounter.    Physical Findings: The patient is in no acute distress. Patient is alert and oriented.  height is 4\' 11"  (1.499 m) and weight is 193 lb 3.2 oz (87.6 kg). Her temperature is 97.9 F (36.6 C). Her blood pressure is 147/56 (abnormal) and her pulse is 66. Her respiration is 20 and oxygen saturation is 100%.   Lungs are clear to auscultation bilaterally. Heart has regular rate and rhythm. No palpable cervical, supraclavicular, or axillary adenopathy. Abdomen soft, non-tender, normal bowel sounds.   On pelvic examination the external genitalia were unremarkable. A speculum exam was performed. A small speculum was used for the examination. The vaginal vault was noted to be shortened. There are no mucosal lesions noted in the vaginal vault. On bimanual and rectovaginal examination there were no pelvic masses appreciated. Vaginal cuff intact.  No inguinal adenopathy bilaterally.    Lab Findings: Lab Results  Component Value Date   WBC 5.0 02/23/2020   HGB 13.7 02/23/2020   HCT 41.3 02/23/2020   MCV 87 02/23/2020   PLT 249 02/23/2020  Radiographic Findings: No results found.  Impression: Stage IIIC-1 grade 2 endometrioid adenocarcinoma    No evidence of recurrence on clinical exam. She is not following up with gynecologic oncology at this time.  Plan: The patient will follow-up with radiation oncology in six months.  This will likely be her last follow-up as she will be 5 years out from her treatment.  She will then follow-up with her gynecologist  from thereon out and will help schedule her with a gynecologist if she does not have any preferences.    Total time spent in this encounter was 25 minutes which included reviewing the patient's most recent interval history, screening mammogram, physical examination, and documentation.  -----------------------------------  Blair Promise, PhD, MD  This document serves as a record of services personally performed by Gery Pray, MD. It was created on his behalf by Clerance Lav, a trained medical scribe. The creation of this record is based on the scribe's personal observations and the provider's statements to them. This document has been checked and approved by the attending provider.

## 2020-03-11 NOTE — Progress Notes (Signed)
Patient completed endometrium radiation back in 2017.  She denies having any pain related to her treatment but does report some lower leg pain associated with cramping.  Patient issues with diarrhea but tolerable.  Patient denies nausea/vomiting.  Patient denies having any bladder issues as of yet.  Reports some mild urinary incontinence.  Denies vaginal discharge.  Denies having any appetite issues and states she is trying to manage her weight.  Reports issues with fatigue.  Vitals:   03/11/20 1031  BP: (!) 147/56  Pulse: 66  Resp: 20  Temp: 97.9 F (36.6 C)  SpO2: 100%  Weight: 193 lb 3.2 oz (87.6 kg)  Height: 4\' 11"  (1.499 m)

## 2020-03-12 ENCOUNTER — Other Ambulatory Visit: Payer: Self-pay

## 2020-03-12 ENCOUNTER — Ambulatory Visit (INDEPENDENT_AMBULATORY_CARE_PROVIDER_SITE_OTHER): Payer: Medicare Other | Admitting: Bariatrics

## 2020-03-12 ENCOUNTER — Encounter (INDEPENDENT_AMBULATORY_CARE_PROVIDER_SITE_OTHER): Payer: Self-pay | Admitting: Bariatrics

## 2020-03-12 VITALS — BP 129/81 | HR 71 | Temp 98.1°F | Ht 59.0 in | Wt 190.0 lb

## 2020-03-12 DIAGNOSIS — E782 Mixed hyperlipidemia: Secondary | ICD-10-CM

## 2020-03-12 DIAGNOSIS — K219 Gastro-esophageal reflux disease without esophagitis: Secondary | ICD-10-CM | POA: Diagnosis not present

## 2020-03-12 DIAGNOSIS — Z6838 Body mass index (BMI) 38.0-38.9, adult: Secondary | ICD-10-CM

## 2020-03-12 DIAGNOSIS — E039 Hypothyroidism, unspecified: Secondary | ICD-10-CM | POA: Diagnosis not present

## 2020-03-13 ENCOUNTER — Telehealth: Payer: Self-pay | Admitting: *Deleted

## 2020-03-13 NOTE — Telephone Encounter (Signed)
CALLED PATIENT TO INFORM OF FU APPT. WITH DR. Haralson ON 09-12-20 @ 11 AM, SPOKE WITH PATIENT AND SHE IS AWARE OF THIS APPT.

## 2020-03-13 NOTE — Progress Notes (Signed)
Chief Complaint:   OBESITY Lydia Walsh is here to discuss her progress with her obesity treatment plan along with follow-up of her obesity related diagnoses. Lydia Walsh is on the Category 2 Plan and states she is following her eating plan approximately 0% of the time. Lydia Walsh states she is doing 0 minutes 0 times per week.  Today's visit was #: 2 Starting weight: 189 lbs Starting date: 02/08/2020 Today's weight: 190 lbs Today's date: 03/12/2020 Total lbs lost to date: 0 Total lbs lost since last in-office visit: 0  Interim History: Lydia Walsh is up 1 lbs since her initial visit. She initially saw Dr. Leafy Walsh on 02/08/2020. She has not been following the plan.  Subjective:   1. Gastroesophageal reflux disease, unspecified whether esophagitis present Lydia Walsh is taking Nexium.  2. Hypothyroidism, unspecified type Lydia Walsh is taking Synthroid and she is stable.  3. Mixed hyperlipidemia Lydia Walsh is taking Crestor.  Assessment/Plan:   1. Gastroesophageal reflux disease, unspecified whether esophagitis present Intensive lifestyle modifications are the first line treatment for this issue. We discussed several lifestyle modifications today. Lydia Walsh will continue Nexium will continue to work on diet, exercise and weight loss efforts. Orders and follow up as documented in patient record.   Counseling . If a person has gastroesophageal reflux disease (GERD), food and stomach acid move back up into the esophagus and cause symptoms or problems such as damage to the esophagus. . Anti-reflux measures include: raising the head of the bed, avoiding tight clothing or belts, avoiding eating late at night, not lying down shortly after mealtime, and achieving weight loss. . Avoid ASA, NSAID's, caffeine, alcohol, and tobacco.  . OTC Pepcid and/or Tums are often very helpful for as needed use.  Lydia Walsh Kitchen However, for persisting chronic or daily symptoms, stronger medications like Omeprazole may be needed. . You may need to avoid foods and  drinks such as: ? Coffee and tea (with or without caffeine). ? Drinks that contain alcohol. ? Energy drinks and sports drinks. ? Bubbly (carbonated) drinks or sodas. ? Chocolate and cocoa. ? Peppermint and mint flavorings. ? Garlic and onions. ? Horseradish. ? Spicy and acidic foods. These include peppers, chili powder, curry powder, vinegar, hot sauces, and BBQ sauce. ? Citrus fruit juices and citrus fruits, such as oranges, lemons, and limes. ? Tomato-based foods. These include red sauce, chili, salsa, and pizza with red sauce. ? Fried and fatty foods. These include donuts, french fries, potato chips, and high-fat dressings. ? High-fat meats. These include hot dogs, rib eye steak, sausage, ham, and bacon.  2. Hypothyroidism, unspecified type Lydia Walsh will continue her medications. Orders and follow up as documented in patient record.  Counseling . Good thyroid control is important for overall health. Supratherapeutic thyroid levels are dangerous and will not improve weight loss results. . Counseling: The correct way to take levothyroxine is fasting, with water, separated by at least 30 minutes from breakfast, and separated by more than 4 hours from calcium, iron, multivitamins, acid reflux medications (PPIs).   3. Mixed hyperlipidemia Cardiovascular risk and specific lipid/LDL goals reviewed. We discussed several lifestyle modifications today. Lydia Walsh will continue her medications, and will continue to work on diet, exercise and weight loss efforts. No trans fats and decreased saturated fats. She is to increase PUFA's and MUFA's. Orders and follow up as documented in patient record.   Counseling Intensive lifestyle modifications are the first line treatment for this issue. . Dietary changes: Increase soluble fiber. Decrease simple carbohydrates. . Exercise changes: Moderate to vigorous-intensity  aerobic activity 150 minutes per week if tolerated. . Lipid-lowering medications: see documented  in medical record.  4. Class 2 severe obesity due to excess calories with serious comorbidity and body mass index (BMI) of 38.0 to 38.9 in adult [facility]) Lydia Walsh is currently in the action stage of change. As such, her goal is to continue with weight loss efforts. She has agreed to the Category 2 Plan.  I reviewed labs from 02/23/2020 with the patient today. Lydia Walsh will stop all sugar drinks.  Exercise goals: No exercise has been prescribed at this time.  Behavioral modification strategies: increasing lean protein intake, decreasing simple carbohydrates, increasing vegetables, increasing water intake, decreasing eating out, no skipping meals, meal planning and cooking strategies, keeping healthy foods in the home and planning for success.  [name] has agreed to follow-up with our clinic in 2 weeks with Lydia Marble, NP or [facility], FNP-C. She was informed of the importance of frequent follow-up visits to maximize her success with intensive lifestyle modifications for her multiple health conditions.   Objective:   Blood pressure 129/81, pulse 71, temperature 98.1 F (36.7 C), height 4\' 11"  (1.499 m), weight 190 lb (86.2 kg), last menstrual period 07/16/2014, SpO2 95 %. Body mass index is 38.38 kg/m.  General: Cooperative, alert, well developed, in no acute distress. HEENT: Conjunctivae and lids unremarkable. Cardiovascular: Regular rhythm.  Lungs: Normal work of breathing. Neurologic: No focal deficits.   Lab Results  Component Value Date   CREATININE 1.06 (H) 02/23/2020   BUN 25 02/23/2020   NA 136 02/23/2020   K 4.2 02/23/2020   CL 103 02/23/2020   CO2 14 (L) 02/23/2020   Lab Results  Component Value Date   ALT 22 09/12/2018   AST 22 09/12/2018   ALKPHOS 106 09/12/2018   BILITOT 0.4 09/12/2018   Lab Results  Component Value Date   HGBA1C 5.6 11/02/2011   No results found for: INSULIN Lab Results  Component Value Date   TSH 2.290 02/23/2020   Lab Results  Component Value  Date   CHOL 195 11/20/2019   HDL 62 11/20/2019   LDLCALC 115 (H) 11/20/2019   TRIG 99 11/20/2019   CHOLHDL 3.1 11/20/2019   Lab Results  Component Value Date   WBC 5.0 02/23/2020   HGB 13.7 02/23/2020   HCT 41.3 02/23/2020   MCV 87 02/23/2020   PLT 249 02/23/2020   Lab Results  Component Value Date   IRON 68 11/20/2019   TIBC 345 11/20/2019   FERRITIN 96 11/20/2019    Obesity Behavioral Intervention:   Approximately 15 minutes were spent on the discussion below.  ASK: We discussed the diagnosis of obesity with Bethena Roys today and Jennamarie agreed to give [place] permission to discuss obesity behavioral modification therapy today.  ASSESS: Raelin has the diagnosis of obesity and her BMI today is 38.35. Yesika is in the action stage of change.   ADVISE: Velda was educated on the multiple health risks of obesity as well as the benefit of weight loss to improve her health. She was advised of the need for long term treatment and the importance of lifestyle modifications to improve her current health and to decrease her risk of future health problems.  AGREE: Multiple dietary modification options and treatment options were discussed and Jahari agreed to follow the recommendations documented in the above note.  ARRANGE: Mikayah was educated on the importance of frequent visits to treat obesity as outlined per CMS and USPSTF guidelines and agreed to schedule  her next follow up appointment today.  Attestation Statements:   Reviewed by clinician on day of visit: allergies, medications, problem list, medical history, surgical history, family history, social history, and previous encounter notes.   Wilhemena Durie, am acting as Location manager for [employer], DO.  I have reviewed the above documentation for accuracy and completeness, and I agree with the above. Jearld Lesch, DO

## 2020-03-14 ENCOUNTER — Encounter (INDEPENDENT_AMBULATORY_CARE_PROVIDER_SITE_OTHER): Payer: Self-pay | Admitting: Bariatrics

## 2020-03-14 ENCOUNTER — Telehealth: Payer: Self-pay | Admitting: *Deleted

## 2020-03-14 NOTE — Telephone Encounter (Signed)
RETURNED PATIENT'S PHONE CALL, SPOKE WITH PATIENT. ?

## 2020-03-28 ENCOUNTER — Ambulatory Visit (INDEPENDENT_AMBULATORY_CARE_PROVIDER_SITE_OTHER): Payer: Medicare Other | Admitting: Adult Health

## 2020-04-04 ENCOUNTER — Other Ambulatory Visit: Payer: Self-pay | Admitting: Cardiovascular Disease

## 2020-04-04 ENCOUNTER — Other Ambulatory Visit: Payer: Self-pay | Admitting: Internal Medicine

## 2020-04-04 DIAGNOSIS — R1310 Dysphagia, unspecified: Secondary | ICD-10-CM

## 2020-04-10 ENCOUNTER — Telehealth: Payer: Self-pay

## 2020-04-10 NOTE — Telephone Encounter (Signed)
Pls contact pt [phone]

## 2020-04-10 NOTE — Telephone Encounter (Signed)
Pt c/o burning sensation with urination- No fever, nausea, vomiting, abd pain, or vag d/c Had similar symptoms last month and was given some "pills" by urologist.   Karen Kays to see urologist anytime soon and requests appt to be seen for possible UTI appt given for tomorrow am at [place].

## 2020-04-11 ENCOUNTER — Encounter: Payer: Medicare Other | Admitting: Internal Medicine

## 2020-04-12 ENCOUNTER — Other Ambulatory Visit: Payer: Self-pay

## 2020-04-12 ENCOUNTER — Encounter: Payer: Self-pay | Admitting: Internal Medicine

## 2020-04-12 ENCOUNTER — Ambulatory Visit (INDEPENDENT_AMBULATORY_CARE_PROVIDER_SITE_OTHER): Payer: Medicare Other | Admitting: Internal Medicine

## 2020-04-12 VITALS — BP 142/68 | HR 66 | Temp 98.1°F | Ht 59.0 in | Wt 194.1 lb

## 2020-04-12 DIAGNOSIS — R300 Dysuria: Secondary | ICD-10-CM | POA: Insufficient documentation

## 2020-04-12 LAB — POCT URINALYSIS DIPSTICK
Bilirubin, UA: NEGATIVE
Blood, UA: NEGATIVE
Glucose, UA: NEGATIVE
Ketones, UA: NEGATIVE
Leukocytes, UA: NEGATIVE
Nitrite, UA: NEGATIVE
Protein, UA: NEGATIVE
Spec Grav, UA: 1.025 (ref 1.010–1.025)
Urobilinogen, UA: 0.2 E.U./dL
pH, UA: 5.5 (ref 5.0–8.0)

## 2020-04-12 NOTE — Progress Notes (Signed)
Internal Medicine Clinic Attending  Case discussed with Dr. Lee  At the time of the visit.  We reviewed the resident's history and exam and pertinent patient test results.  I agree with the assessment, diagnosis, and plan of care documented in the resident's note.    

## 2020-04-12 NOTE — Patient Instructions (Addendum)
Dear [name],  Thank you for allowing [place] to provide your care today. Today we discussed your urine symptoms    I have ordered no labs for you. I will call if any are abnormal.    Today we made the following changes to your medications:    Please follow-up as needed.    Please call the internal medicine center clinic if you have any questions or concerns, we may be able to help and keep you from a long and expensive emergency room wait. Our clinic and after hours phone number is [phone], the best time to call is Monday through Friday 9 am to 4 pm but there is always someone available 24/7 if you have an emergency. If you need medication refills please notify your pharmacy one week in advance and they will send [place] a request.    If you have not gotten the COVID vaccine, I recommend doing so:  You may get it at your local [facility] or [facility] OR To schedule an appointment for a COVID vaccine or be added to the vaccine wait list: Go to [email or web address]   OR Go to [email or web address]                  OR Call [phone]                                     OR Call [phone] and select Option 2  Thank you for choosing [facility]

## 2020-04-12 NOTE — Assessment & Plan Note (Signed)
Lydia Walsh is a 65 yo F w/ PMH of HTN, GERD, OSA, hypothyroidism presenting with burning sensation associated with urination. She was in her usual state of health until last Friday when she developed burning sensation with urination. She denies any obvious inciting event. She denies any fevers, chills, nausea, vomiting, diarrhea, constipation. She drank some cranberry juice and her symptoms resolved yesterday. She mentions having similar symptoms couple months prior diagnosed as UTi by her urologist. Currently not sexually active.  A/p Present with dysuria without obvious cause. Urine dipstick shows no significant findings. Chart review does show prior UTi in December 2021 but unable to find any culture data. 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 Will monitor for now.  - Advised on red-flag symptoms and return precautions provided

## 2020-04-12 NOTE — Progress Notes (Signed)
   CC: Burning while peeing  HPI: Lydia Walsh is a 65 y.o. with PMH listed below presenting with complaint of dysuria. Please see problem based assessment and plan for further details.  Past Medical History:  Diagnosis Date  . Anemia   . Back pain   . Endometrial cancer ([place])   . Endometrial polyp   . GERD (gastroesophageal reflux disease)   . Heart murmur   . Heart palpitations   . History of blood transfusion   . History of chemotherapy   . History of radiation therapy 09/02/2015-10/24/2015   Endometrium; Dr. Gery Pray  . History of uterine fibroid   . Hyperlipidemia   . Hypertension   . Hypothyroidism   . IBS (irritable bowel syndrome)   . Joint pain   . OSA (obstructive sleep apnea)    moderate OSA per study 06-21-2005--  per pt does need cpap anymore never purchased it  . Osteoarthritis   . Other fatigue   . Other specified disorders of thyroid   . PMB (postmenopausal bleeding)   . Shortness of breath on exertion   . Wears dentures    upper  . Wears glasses    Review of Systems: Review of Systems  Constitutional: Negative for chills, fever and malaise/fatigue.  Gastrointestinal: Negative for constipation, diarrhea, nausea and vomiting.  Genitourinary: Positive for dysuria. Negative for flank pain, frequency, hematuria and urgency.  All other systems reviewed and are negative.    Physical Exam: Vitals:   04/12/20 1046 04/12/20 1103  BP: (!) 149/58 (!) 142/68  Pulse: 66   Temp: 98.1 F (36.7 C)   TempSrc: Oral   SpO2: 100%   Weight: 194 lb 1.6 oz (88 kg)   Height: 4\' 11"  (1.499 m)    Gen: Well-developed, well nourished, NAD HEENT: NCAT head, hearing intact CV: RRR, S1, S2 normal Pulm: CTAB, No rales, no wheezes Abd: BS+, Non-distended, suprapubic tenderness to palpation Extm: ROM intact, Peripheral pulses intact, No peripheral edema Skin: Dry, Warm, normal turgor  Assessment & Plan:   Dysuria Lydia Walsh is a 65 yo F w/ PMH of HTN, GERD,  OSA, hypothyroidism presenting with burning sensation associated with urination. She was in her usual state of health until last Friday when she developed burning sensation with urination. She denies any obvious inciting event. She denies any fevers, chills, nausea, vomiting, diarrhea, constipation. She drank some cranberry juice and her symptoms resolved yesterday. She mentions having similar symptoms couple months prior diagnosed as UTi by her urologist. Currently not sexually active.  A/p Present with dysuria without obvious cause. Urine dipstick shows no significant findings. Chart review does show prior UTi in December 2021 but unable to find any culture data. 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 Will monitor for now.  - Advised on red-flag symptoms and return precautions provided    Patient discussed with Dr. Jimmye Norman  -Gilberto Better, [facility] Pager: [phone]

## 2020-04-29 ENCOUNTER — Other Ambulatory Visit: Payer: Self-pay | Admitting: Internal Medicine

## 2020-04-29 DIAGNOSIS — E876 Hypokalemia: Secondary | ICD-10-CM

## 2020-05-01 NOTE — Telephone Encounter (Signed)
  potassium chloride (KLOR-CON) 10 MEQ tablet, REFILL REQUEST @  [place] ([place]), [place] Phone:  [phone]  Fax:  [phone]

## 2020-05-02 ENCOUNTER — Other Ambulatory Visit: Payer: Self-pay

## 2020-05-02 ENCOUNTER — Ambulatory Visit (HOSPITAL_COMMUNITY)
Admission: RE | Admit: 2020-05-02 | Discharge: 2020-05-02 | Disposition: A | Discharge status: Home / Self Care | Payer: Medicare Other | Source: Ambulatory Visit | Attending: Internal Medicine | Admitting: Internal Medicine

## 2020-05-02 ENCOUNTER — Ambulatory Visit (INDEPENDENT_AMBULATORY_CARE_PROVIDER_SITE_OTHER): Payer: Medicare Other | Admitting: Internal Medicine

## 2020-05-02 VITALS — BP 142/76 | HR 79 | Temp 98.4°F | Wt 192.8 lb

## 2020-05-02 DIAGNOSIS — R070 Pain in throat: Secondary | ICD-10-CM

## 2020-05-02 DIAGNOSIS — M542 Cervicalgia: Secondary | ICD-10-CM | POA: Diagnosis not present

## 2020-05-02 DIAGNOSIS — I10 Essential (primary) hypertension: Secondary | ICD-10-CM | POA: Diagnosis not present

## 2020-05-02 DIAGNOSIS — E039 Hypothyroidism, unspecified: Secondary | ICD-10-CM

## 2020-05-02 DIAGNOSIS — E876 Hypokalemia: Secondary | ICD-10-CM

## 2020-05-02 DIAGNOSIS — K219 Gastro-esophageal reflux disease without esophagitis: Secondary | ICD-10-CM

## 2020-05-02 DIAGNOSIS — R1310 Dysphagia, unspecified: Secondary | ICD-10-CM

## 2020-05-02 DIAGNOSIS — M7918 Myalgia, other site: Secondary | ICD-10-CM | POA: Diagnosis not present

## 2020-05-02 MED ORDER — OMEPRAZOLE 40 MG PO CPDR: 40.0000 mg | DELAYED_RELEASE_CAPSULE | Freq: Every day | ORAL | 2 refills | Status: DC

## 2020-05-02 MED ORDER — POTASSIUM CHLORIDE ER 10 MEQ PO TBCR: 10.0000 meq | EXTENDED_RELEASE_TABLET | Freq: Every day | ORAL | 0 refills | Status: DC

## 2020-05-02 MED ORDER — AMLODIPINE BESYLATE 10 MG PO TABS
10.0000 mg | ORAL_TABLET | Freq: Every day | ORAL | 11 refills | Status: DC
Start: 2020-05-02 — End: 2021-05-10

## 2020-05-02 NOTE — Patient Instructions (Signed)
Thank you, [name] for allowing [place] to provide your care today. Today we discussed neck pain.    I have ordered the following labs for you:   Lab Orders     BMP8+Anion Gap     TSH   Tests ordered today:  X-ray of neck  Referrals ordered today:    Referral Orders     Ambulatory referral to Gastroenterology   Medication Changes:   Medications Discontinued During This Encounter  Medication Reason  . omeprazole (PRILOSEC) 20 MG capsule Dose change  . potassium chloride (KLOR-CON) 10 MEQ tablet Reorder     Meds ordered this encounter  Medications  . amLODipine (NORVASC) 10 MG tablet    Sig: Take 1 tablet (10 mg total) by mouth daily.    Dispense:  30 tablet    Refill:  11  . potassium chloride (KLOR-CON) 10 MEQ tablet    Sig: Take 1 tablet (10 mEq total) by mouth daily.    Dispense:  90 tablet    Refill:  0  . omeprazole (PRILOSEC) 40 MG capsule    Sig: Take 1 capsule (40 mg total) by mouth daily.    Dispense:  30 capsule    Refill:  2     Instructions: see attached  Follow up: 1 month   Remember:    Should you have any questions or concerns please call the internal medicine clinic at [phone].     Marianna Payment, D.O. [place] [facility] Gastroes[facility] Reflux Disease, Adult When you have gastroesophageal reflux disease (GERD), the foods you eat and your eating habits are very important. Choosing the right foods can help ease your discomfort. Think about working with a food expert (dietitian) to help you make good choices. What are tips for following this plan? Reading food labels  Look for foods that are low in saturated fat. Foods that may help with your symptoms include: ? Foods that have less than 5% of daily value (DV) of fat. ? Foods that have 0 grams of trans fat. Cooking  Do not fry your food.  Cook your food by baking, steaming, grilling, or broiling. These are all methods that do not need a lot  of fat for cooking.  To add flavor, try to use herbs that are low in spice and acidity. Meal planning  Choose healthy foods that are low in fat, such as: ? Fruits and vegetables. ? Whole grains. ? Low-fat dairy products. ? Lean meats, fish, and poultry.  Eat small meals often instead of eating 3 large meals each day. Eat your meals slowly in a place where you are relaxed. Avoid bending over or lying down until 2-3 hours after eating.  Limit high-fat foods such as fatty meats or fried foods.  Limit your intake of fatty foods, such as oils, butter, and shortening.  Avoid the following as told by your doctor: ? Foods that cause symptoms. These may be different for different people. Keep a food diary to keep track of foods that cause symptoms. ? Alcohol. ? Drinking a lot of liquid with meals. ? Eating meals during the 2-3 hours before bed.   Lifestyle  Stay at a healthy weight. Ask your doctor what weight is healthy for you. If you need to lose weight, work with your doctor to do so safely.  Exercise for at least 30 minutes on 5 or more days each week, or as told by your doctor.  Wear loose-fitting  clothes.  Do not smoke or use any products that contain nicotine or tobacco. If you need help quitting, ask your doctor.  Sleep with the head of your bed higher than your feet. Use a wedge under the mattress or blocks under the bed frame to raise the head of the bed.  Chew sugar-free gum after meals. What foods should eat? Eat a healthy, well-balanced diet of fruits, vegetables, whole grains, low-fat dairy products, lean meats, fish, and poultry. Each person is different. Foods that may cause symptoms in one person may not cause any symptoms in another person. Work with your doctor to find foods that are safe for you. The items listed above may not be a complete list of what you can eat and drink. Contact a food expert for more options.   What foods should I avoid? Limiting some of  these foods may help in managing the symptoms of GERD. Everyone is different. Talk with a food expert or your doctor to help you find the exact foods to avoid, if any. Fruits Any fruits prepared with added fat. Any fruits that cause symptoms. For some people, this may include citrus fruits, such as oranges, grapefruit, pineapple, and lemons. Vegetables Deep-fried vegetables. [place] fries. Any vegetables prepared with added fat. Any vegetables that cause symptoms. For some people, this may include tomatoes and tomato products, chili peppers, onions and garlic, and horseradish. Grains Pastries or quick breads with added fat. Meats and other proteins High-fat meats, such as fatty beef or pork, hot dogs, ribs, ham, sausage, salami, and bacon. Fried meat or protein, including fried fish and fried chicken. Nuts and nut butters, in large amounts. Dairy Whole milk and chocolate milk. Sour cream. Cream. Ice cream. Cream cheese. Milkshakes. Fats and oils Butter. Margarine. Shortening. Ghee. Beverages Coffee and tea, with or without caffeine. Carbonated beverages. Sodas. Energy drinks. Fruit juice made with acidic fruits, such as orange or grapefruit. Tomato juice. Alcoholic drinks. Sweets and desserts Chocolate and cocoa. Donuts. Seasonings and condiments Pepper. Peppermint and spearmint. Added salt. Any condiments, herbs, or seasonings that cause symptoms. For some people, this may include curry, hot sauce, or vinegar-based salad dressings. The items listed above may not be a complete list of what you should not eat and drink. Contact a food expert for more options. Questions to ask your doctor Diet and lifestyle changes are often the first steps that are taken to manage symptoms of GERD. If diet and lifestyle changes do not help, talk with your doctor about taking medicines. Where to find more information  International Foundation for Gastrointestinal Disorders: aboutgerd.org Summary  When you  have GERD, food and lifestyle choices are very important in easing your symptoms.  Eat small meals often instead of 3 large meals a day. Eat your meals slowly and in a place where you are relaxed.  Avoid bending over or lying down until 2-3 hours after eating.  Limit high-fat foods such as fatty meats or fried foods. This information is not intended to replace advice given to you by your health care provider. Make sure you discuss any questions you have with your health care provider. Document Revised: 07/10/2019 Document Reviewed: 07/10/2019 Elsevier Patient Education  [place].

## 2020-05-02 NOTE — Progress Notes (Signed)
CC: Neck pain  HPI:  Ms.Makalya S Rodd is a 65 y.o. female with a past medical history stated below and presents today for neck pain. Please see problem based assessment and plan for additional details.  Past Medical History:  Diagnosis Date  . Anemia   . Back pain   . Endometrial cancer ([facility])   . Endometrial polyp   . GERD (gastroesophageal reflux disease)   . Heart murmur   . Heart palpitations   . History of blood transfusion   . History of chemotherapy   . History of radiation therapy 09/02/2015-10/24/2015   Endometrium; Dr. Gery Pray  . History of uterine fibroid   . Hyperlipidemia   . Hypertension   . Hypothyroidism   . IBS (irritable bowel syndrome)   . Joint pain   . OSA (obstructive sleep apnea)    moderate OSA per study 06-21-2005--  per pt does need cpap anymore never purchased it  . Osteoarthritis   . Other fatigue   . Other specified disorders of thyroid   . PMB (postmenopausal bleeding)   . Shortness of breath on exertion   . Wears dentures    upper  . Wears glasses     Current Outpatient Medications on File Prior to Visit  Medication Sig Dispense Refill  . rosuvastatin (CRESTOR) 20 MG tablet Take 1 tablet by mouth once daily 90 tablet 0  . allopurinol (ZYLOPRIM) 100 MG tablet Take 1 tablet by mouth once daily 90 tablet 0  . fluticasone (FLONASE) 50 MCG/ACT nasal spray Spray 1-2 sprays per nostril per day as needed for treatment of allergies. 16 g 5  . levothyroxine (EUTHYROX) 112 MCG tablet Take 1 tablet (112 mcg total) by mouth daily. 30 tablet 5  . metoprolol succinate (TOPROL-XL) 50 MG 24 hr tablet TAKE 3 TABLETS BY MOUTH ONCE DAILY WITH MEALS OR  IMMEDIATELY  FOLLOWING (Patient taking differently: TAKE 1 1/2 TABLETS BY MOUTH ONCE DAILY WITH MEALS OR  IMMEDIATELY  FOLLOWING) 270 tablet 1  . Multiple Vitamins-Minerals (CENTRUM PO) Take by mouth.    Marland Kitchen omeprazole (PRILOSEC) 20 MG capsule Take 1 capsule by mouth once daily 90 capsule 0  . potassium  chloride (KLOR-CON) 10 MEQ tablet Take 1 tablet by mouth once daily 90 tablet 0  . spironolactone (ALDACTONE) 25 MG tablet Take 3 tablets by mouth once daily 270 tablet 1  . [DISCONTINUED] esomeprazole (NEXIUM) 20 MG capsule Take 2 capsules (40 mg total) by mouth daily before breakfast. 30 capsule 3   No current facility-administered medications on file prior to visit.    Family History  Problem Relation Age of Onset  . Hypertension Mother   . Heart failure Mother   . Sudden death Mother   . Stroke Mother   . Cirrhosis Father        alcoholic  . Liver disease Father   . Alcoholism Father   . Heart disease Son   . Colon cancer Neg Hx   . Stomach cancer Neg Hx   . Rectal cancer Neg Hx   . Esophageal cancer Neg Hx   . Liver cancer Neg Hx     Social History   Socioeconomic History  . Marital status: Widowed    Spouse name: Not on file  . Number of children: 3  . Years of education: Not on file  . Highest education level: Not on file  Occupational History  . Occupation: Production manager, [employer]  Tobacco Use  . Smoking status:  Former Smoker    Years: 2.00    Types: Cigarettes    Quit date: 02/28/1994    Years since quitting: 26.1  . Smokeless tobacco: Never Used  Vaping Use  . Vaping Use: Never used  Substance and Sexual Activity  . Alcohol use: No    Alcohol/week: 0.0 standard drinks  . Drug use: No  . Sexual activity: Not Currently    Birth control/protection: Post-menopausal  Other Topics Concern  . Not on file  Social History Narrative   Widowed she has 3 sons one died from heart issues.   She is employed with security at [employer]   No alcohol tobacco or drug use   Social Determinants of Radio broadcast assistant Strain: Not on file  Food Insecurity: Not on file  Transportation Needs: Not on file  Physical Activity: Not on file  Stress: Not on file  Social Connections: Not on file  Intimate Partner Violence: Not on file    Review of  Systems: ROS negative except for what is noted on the assessment and plan.  Vitals:   05/02/20 0859  BP: (!) 153/79  Pulse: 79  Temp: 98.4 F (36.9 C)  TempSrc: Oral  SpO2: 100%  Weight: 192 lb 12.8 oz (87.5 kg)    Physical Exam: Gen: A&O x3 and in no apparent distress, well appearing and nourished. HEENT: Head - normocephalic, atraumatic. Eye -  visual acuity grossly intact, conjunctiva clear, sclera non-icteric, EOM intact. Mouth - No obvious caries or periodontal disease. Neck: no obvious masses or nodules, AROM intact. CV: RRR, no murmurs, rubs, or gallops. S1/S2 presents  Resp: Clear to ascultation bilaterally  Abd: BS (+) x4, soft, non-tender, without obvious hepatosplenomegaly or masses MSK: Grossly normal AROM and strength x4 extremities.  Point tenderness under the submandibular region bilaterally Skin: good skin turgor, no rashes, unusual bruising, or prominent lesions.  Neuro: No focal deficits, grossly normal sensation and coordination.  Psych: Oriented x3 and responding appropriately. Intact recent and remote memory, normal mood, judgement, affect , and insight.    Assessment & Plan:   See Encounters Tab for problem based charting.  Patient discussed with Dr. Lars Mage, D.O. [facility], PGY-2 Pager: [phone], Phone: [phone] Date 05/02/2020 Time 9:06 AM

## 2020-05-03 ENCOUNTER — Encounter: Payer: Self-pay | Admitting: Internal Medicine

## 2020-05-03 ENCOUNTER — Telehealth: Payer: Self-pay

## 2020-05-03 LAB — BMP8+ANION GAP
Anion Gap: 22 mmol/L — ABNORMAL HIGH (ref 10.0–18.0)
BUN/Creatinine Ratio: 26 (ref 12–28)
BUN: 22 mg/dL (ref 8–27)
CO2: 18 mmol/L — ABNORMAL LOW (ref 20–29)
Calcium: 10.3 mg/dL (ref 8.7–10.3)
Chloride: 102 mmol/L (ref 96–106)
Creatinine, Ser: 0.85 mg/dL (ref 0.57–1.00)
Glucose: 116 mg/dL — ABNORMAL HIGH (ref 65–99)
Potassium: 4.6 mmol/L (ref 3.5–5.2)
Sodium: 142 mmol/L (ref 134–144)
eGFR: 76 mL/min/{1.73_m2} (ref >59)

## 2020-05-03 LAB — TSH: TSH: 1.33 u[IU]/mL (ref 0.450–4.500)

## 2020-05-03 NOTE — Progress Notes (Signed)
Internal Medicine Clinic Attending  Case discussed with Dr. Coe  At the time of the visit.  We reviewed the resident's history and exam and pertinent patient test results.  I agree with the assessment, diagnosis, and plan of care documented in the resident's note.  

## 2020-05-03 NOTE — Progress Notes (Signed)
Patient called regarding her XR and lab result. She did not answer left a message stating that I will send her results via letter.    Lawerance Cruel, D.O.  Internal Medicine Resident, PGY-2 [facility]  Pager: [phone] 9:52 AM, 05/03/2020

## 2020-05-03 NOTE — Telephone Encounter (Signed)
Requesting to speak with Dr. Marianna Payment for lab results, please call pt back.

## 2020-05-03 NOTE — Assessment & Plan Note (Signed)
Patient presents for reevaluation of throat pain.  Patient states that the pain has been there for few weeks is located in the submandibular region of her neck.  She states that it is point tender.  She denies any trauma or recent injuries to the area.  She denies any radiation of the pain or associated symptoms.  She denies any recent fevers, chills, arthralgias, myalgias, new medications, changes in medications, recent travel.  She does admit, however, that she has significant gastroesophageal reflux requiring acid reducing medication to help her sleep at night.  She states that she does experience worsening of her symptoms when she lays flat and has to sleep on 2 pillows nightly.  She has a difficult time correlating her symptoms with her gastroesophageal reflux.  She has previously evaluated by ENT for similar pain and was started on a PPI at that time.  Her last upper endoscopy was several years back which showed gastritis without any other underlying pathology.   On exam her neck is point tender of the submandibular regions of her neck bilaterally.  There is no discrete masses or lymphadenopathy.  She has full active and passive range of motion and there is no overlying erythema, edema, or rash.  Assessment: Musculoskeletal pain versus worsening of her esophageal reflux     Plan: 1. Counseled patient regarding dietary modifications and gave handout 2. Increased PPI to 40 mg daily 3. X-ray of her neck to rule out masses 4. Will refer back to GI for resistant GERD.

## 2020-05-03 NOTE — Telephone Encounter (Signed)
Called patient as request. Informed her of her results.

## 2020-05-03 NOTE — Assessment & Plan Note (Signed)
Blood pressure not controlled today.  We will add amlodipine 10 mg and repeat BMP for kidney function and electrolytes.

## 2020-05-07 ENCOUNTER — Other Ambulatory Visit: Payer: Self-pay | Admitting: Internal Medicine

## 2020-05-19 ENCOUNTER — Encounter: Payer: Self-pay | Admitting: *Deleted

## 2020-05-19 NOTE — Progress Notes (Signed)

## 2020-05-20 NOTE — Progress Notes (Signed)
Things That May Be Affecting Your Health:  Alcohol  Hearing loss x Pain    Depression  Home Safety  Sexual Health   Diabetes  Lack of physical activity  Stress   Difficulty with daily activities  Loneliness  Tiredness   Drug use x Medicines x Tobacco use   Falls  Motor Vehicle Safety  Weight   Food choices  Oral Health  Other    YOUR PERSONALIZED HEALTH PLAN : 1. Schedule your next subsequent Medicare Wellness visit in one year 2. Attend all of your regular appointments to address your medical issues 3. Complete the preventative screenings and services   Annual Wellness Visit   Medicare Covered Preventative Screenings and [place] Men and Women Who How Often Need? Date of Last Service Action  Abdominal Aortic Aneurysm Adults with AAA risk factors Once      Alcohol Misuse and Counseling All Adults Screening once a year if no alcohol misuse. Counseling up to 4 face to face sessions.     Bone Density Measurement  Adults at risk for osteoporosis Once every 2 yrs x     Lipid Panel Z13.6 All adults without CV disease Once every 5 yrs       Colorectal Cancer   Stool sample or  Colonoscopy All adults 70 and older   Once every year  Every 10 years        Depression All Adults Once a year  Today   Diabetes Screening Blood glucose, post glucose load, or GTT Z13.1  All adults at risk  Pre-diabetics  Once per year  Twice per year      Diabetes  Self-Management Training All adults Diabetics 10 hrs first year; 2 hours subsequent years. Requires Copay     Glaucoma  Diabetics  Family history of glaucoma  African Americans 76 yrs +  Hispanic Americans 65 yrs + Annually - requires coppay      Hepatitis C Z72.89 or F19.20  High Risk for HCV  Born between 1945 and 1965  Annually  Once      HIV Z11.4 All adults based on risk  Annually btw ages 60 & 44 regardless of risk  Annually > 65 yrs if at increased risk      Lung Cancer Screening  Asymptomatic adults aged 5-77 with 30 pack yr history and current smoker OR quit within the last 15 yrs Annually Must have counseling and shared decision making documentation before first screen      Medical Nutrition Therapy Adults with   Diabetes  Renal disease  Kidney transplant within past 3 yrs 3 hours first year; 2 hours subsequent years     Obesity and Counseling All adults Screening once a year Counseling if BMI 30 or higher  Today   Tobacco Use Counseling Adults who use tobacco  Up to 8 visits in one year     Vaccines Z23  Hepatitis B  Influenza   Pneumonia  Adults   Once  Once every flu season  Two different vaccines separated by one year x    Next Annual Wellness Visit People with Medicare Every year  Today     Services & Screenings Women Who How Often Need  Date of Last Service Action  Mammogram  Z12.31 Women over 23 One baseline ages 90-39. Annually ager 40 yrs+      Pap tests All women Annually if high risk. Every 2 yrs for normal risk women  Screening for cervical cancer with   Pap (Z01.419 nl or Z01.411abnl) &  HPV Z11.51 Women aged 24 to 12 Once every 5 yrs     Screening pelvic and breast exams All women Annually if high risk. Every 2 yrs for normal risk women     Sexually Transmitted Diseases  Chlamydia  Gonorrhea  Syphilis All at risk adults Annually for non pregnant females at increased risk         [place] Men Who How Ofter Need  Date of Last Service Action  Prostate Cancer - DRE & PSA Men over 50 Annually.  DRE might require a copay.        Sexually Transmitted Diseases  Syphilis All at risk adults Annually for men at increased risk      Health Maintenance List Health Maintenance  Topic Date Due  . DEXA SCAN  Never done  . PNA vac Low Risk Adult (1 of 2 - PCV13) Never done  . COVID-19 Vaccine (3 - Pfizer risk 4-dose series) 11/21/2019  . INFLUENZA VACCINE  08/12/2020  . TETANUS/TDAP  10/14/2020  .  MAMMOGRAM  09/12/2021  . COLONOSCOPY (Pts 45-16yrs Insurance coverage will need to be confirmed)  04/30/2026  . Hepatitis C Screening  Completed  . HPV VACCINES  Aged Out

## 2020-05-22 ENCOUNTER — Other Ambulatory Visit: Payer: Self-pay | Admitting: Internal Medicine

## 2020-05-22 DIAGNOSIS — E039 Hypothyroidism, unspecified: Secondary | ICD-10-CM

## 2020-05-28 ENCOUNTER — Ambulatory Visit (INDEPENDENT_AMBULATORY_CARE_PROVIDER_SITE_OTHER): Payer: Medicare Other | Admitting: Cardiovascular Disease

## 2020-05-28 ENCOUNTER — Encounter: Payer: Self-pay | Admitting: Cardiovascular Disease

## 2020-05-28 ENCOUNTER — Other Ambulatory Visit: Payer: Self-pay

## 2020-05-28 DIAGNOSIS — E785 Hyperlipidemia, unspecified: Secondary | ICD-10-CM | POA: Diagnosis not present

## 2020-05-28 DIAGNOSIS — R002 Palpitations: Secondary | ICD-10-CM | POA: Diagnosis not present

## 2020-05-28 DIAGNOSIS — I10 Essential (primary) hypertension: Secondary | ICD-10-CM

## 2020-05-28 NOTE — Assessment & Plan Note (Signed)
History of essential hypertension blood pressure measured today 136/72.  She is on amlodipine and metoprolol. 

## 2020-05-28 NOTE — Assessment & Plan Note (Signed)
BMI of 39.  Patient expresses a desire for weight loss.  We will refer her to Dr. Leafy Ro at the [facility] diagnostic center

## 2020-05-28 NOTE — Assessment & Plan Note (Signed)
History of palpitations with event monitor performed in 2017 that showed sinus rhythm.  She does admit to drinking excessive amount of caffeine on a daily basis.

## 2020-05-28 NOTE — Assessment & Plan Note (Signed)
>>  ASSESSMENT AND PLAN FOR MORBID OBESITY (HCC) WRITTEN ON 05/28/2020  8:53 AM BY BERRY, JONATHAN J, MD  BMI of 39.  Patient expresses a desire for weight loss.  We will refer her to Dr. Verdon at the [facility] diagnostic center

## 2020-05-28 NOTE — Assessment & Plan Note (Signed)
History of dyslipidemia on statin therapy with lipid profile performed 11/20/2019 revealing total cholesterol 195, LDL of 115 and HDL of 62.

## 2020-05-28 NOTE — Patient Instructions (Signed)
Medication Instructions:  Your physician recommends that you continue on your current medications as directed. Please refer to the Current Medication list given to you today.  *If you need a refill on your cardiac medications before your next appointment, please call your pharmacy*  Testing/Procedures: Dr. Berry has ordered a CT coronary calcium score. This test is done at [address]. This is $99 out of pocket.   Coronary CalciumScan A coronary calcium scan is an imaging test used to look for deposits of calcium and other fatty materials (plaques) in the inner lining of the blood vessels of the heart (coronary arteries). These deposits of calcium and plaques can partly clog and narrow the coronary arteries without producing any symptoms or warning signs. This puts a person at risk for a heart attack. This test can detect these deposits before symptoms develop. Tell a health care provider about:  Any allergies you have.  All medicines you are taking, including vitamins, herbs, eye drops, creams, and over-the-counter medicines.  Any problems you or family members have had with anesthetic medicines.  Any blood disorders you have.  Any surgeries you have had.  Any medical conditions you have.  Whether you are pregnant or may be pregnant. What are the risks? Generally, this is a safe procedure. However, problems may occur, including:  Harm to a pregnant woman and her unborn baby. This test involves the use of radiation. Radiation exposure can be dangerous to a pregnant woman and her unborn baby. If you are pregnant, you generally should not have this procedure done.  Slight increase in the risk of cancer. This is because of the radiation involved in the test. What happens before the procedure? No preparation is needed for this procedure. What happens during the procedure?  You will undress and remove any jewelry around your neck or chest.  You will put on a  hospital gown.  Sticky electrodes will be placed on your chest. The electrodes will be connected to an electrocardiogram (ECG) machine to record a tracing of the electrical activity of your heart.  A CT scanner will take pictures of your heart. During this time, you will be asked to lie still and hold your breath for 2-3 seconds while a picture of your heart is being taken. The procedure may vary among health care providers and hospitals. What happens after the procedure?  You can get dressed.  You can return to your normal activities.  It is up to you to get the results of your test. Ask your health care provider, or the department that is doing the test, when your results will be ready. Summary  A coronary calcium scan is an imaging test used to look for deposits of calcium and other fatty materials (plaques) in the inner lining of the blood vessels of the heart (coronary arteries).  Generally, this is a safe procedure. Tell your health care provider if you are pregnant or may be pregnant.  No preparation is needed for this procedure.  A CT scanner will take pictures of your heart.  You can return to your normal activities after the scan is done. This information is not intended to replace advice given to you by your health care provider. Make sure you discuss any questions you have with your health care provider. Document Released: 06/27/2007 Document Revised: 11/18/2015 Document Reviewed: 11/18/2015 Elsevier Interactive Patient Education  2017 Elsevier Inc.     Follow-Up: At [facility], you and your health needs are   our priority.  As part of our continuing mission to provide you with exceptional heart care, we have created designated Provider Care Teams.  These Care Teams include your primary Cardiologist (physician) and Advanced Practice Providers (APPs -  Physician Assistants and Nurse Practitioners) who all work together to provide you with the care you need, when you need  it.  We recommend signing up for the patient portal called "MyChart".  Sign up information is provided on this After Visit Summary.  MyChart is used to connect with patients for Virtual Visits (Telemedicine).  Patients are able to view lab/test results, encounter notes, upcoming appointments, etc.  Non-urgent messages can be sent to your provider as well.   To learn more about what you can do with MyChart, go to [email or web address].    Your next appointment:   12 month(s)  The format for your next appointment:   In Person  Provider:   Jonathan Berry, MD 

## 2020-05-28 NOTE — Progress Notes (Signed)
05/28/2020 Lydia Walsh   07-Feb-1955  [id number]  Primary Physician Lydia Noble, MD Primary Cardiologist: Lorretta Harp MD Lydia Walsh, [place]  HPI:  Lydia Walsh is a 65 y.o.  moderately overweight divorced African American female mother of 3 children and is [place]. She was referred by her PCP for evaluation of palpitations.I last saw her in the office  04/07/2019. She did see Lydia Ferries, PA-C in the office 6 months later. She's been evaluated for this in the past several years ago. She also has had a diagnosis of endometrial cancer in June 2017 she has no other cardiac risk factors. She does not drink caffeine.I ordered a 2-D echocardiogram which was entirely normal and the monitor that showed only sinus rhythm.  She did have a Port-A-Cath indwelling which was removed in 2018.  She  did have a CT scan performed 03/17/2018 that had incidental finding of coronary calcification .    She was complaining of atypical chest pain when I saw her a year ago but this is no longer is a symptom.  Since I saw her a year ago she has done well.  She gets occasional palpitations but does admit to drinking a large 16 ounce cup of coffee a day.  She also expresses a desire for weight loss.   Current Meds  Medication Sig  . allopurinol (ZYLOPRIM) 100 MG tablet Take 1 tablet by mouth once daily  . amLODipine (NORVASC) 10 MG tablet Take 1 tablet (10 mg total) by mouth daily.  Lydia Walsh 112 MCG tablet Take 1 tablet by mouth once daily  . fluticasone (FLONASE) 50 MCG/ACT nasal spray USE 1-2 SPRAY(S) IN EACH NOSTRIL AS NEEDED FOR TREATMENT OF ALLERGIES  . metoprolol succinate (TOPROL-XL) 50 MG 24 hr tablet TAKE 3 TABLETS BY MOUTH ONCE DAILY WITH MEALS OR  IMMEDIATELY  FOLLOWING (Patient taking differently: TAKE 1 1/2 TABLETS BY MOUTH ONCE DAILY WITH MEALS OR  IMMEDIATELY  FOLLOWING)  . Multiple Vitamins-Minerals (CENTRUM PO) Take by mouth.  Marland Kitchen omeprazole  (PRILOSEC) 40 MG capsule Take 1 capsule (40 mg total) by mouth daily.  . potassium chloride (KLOR-CON) 10 MEQ tablet Take 1 tablet (10 mEq total) by mouth daily.  . rosuvastatin (CRESTOR) 20 MG tablet Take 1 tablet by mouth once daily  . spironolactone (ALDACTONE) 25 MG tablet Take 3 tablets by mouth once daily     Allergies  Allergen Reactions  . Shrimp (Diagnostic)     Anaphylaxis  . Hctz [Hydrochlorothiazide] Rash    Social History   Socioeconomic History  . Marital status: Widowed    Spouse name: Not on file  . Number of children: 3  . Years of education: Not on file  . Highest education level: Not on file  Occupational History  . Occupation: Production manager, [employer]  Tobacco Use  . Smoking status: Former Smoker    Years: 2.00    Types: Cigarettes    Quit date: 02/28/1994    Years since quitting: 26.2  . Smokeless tobacco: Never Used  Vaping Use  . Vaping Use: Never used  Substance and Sexual Activity  . Alcohol use: No    Alcohol/week: 0.0 standard drinks  . Drug use: No  . Sexual activity: Not Currently    Birth control/protection: Post-menopausal  Other Topics Concern  . Not on file  Social History Narrative   Widowed she has 3 sons one died from heart issues.  She is employed with security at [employer]   No alcohol tobacco or drug use   Social Determinants of Radio broadcast assistant Strain: Not on file  Food Insecurity: Not on file  Transportation Needs: Not on file  Physical Activity: Not on file  Stress: Not on file  Social Connections: Not on file  Intimate Partner Violence: Not on file     Review of Systems: General: negative for chills, fever, night sweats or weight changes.  Cardiovascular: negative for chest pain, dyspnea on exertion, edema, orthopnea, palpitations, paroxysmal nocturnal dyspnea or shortness of breath Dermatological: negative for rash Respiratory: negative for cough or wheezing Urologic: negative for  hematuria Abdominal: negative for nausea, vomiting, diarrhea, bright red blood per rectum, melena, or hematemesis Neurologic: negative for visual changes, syncope, or dizziness All other systems reviewed and are otherwise negative except as noted above.    Blood pressure 136/72, pulse 71, height 4\' 11"  (1.499 m), weight 191 lb (86.6 kg), last menstrual period 07/16/2014, SpO2 98 %.  General appearance: alert and no distress Neck: no adenopathy, no carotid bruit, no JVD, supple, symmetrical, trachea midline and thyroid not enlarged, symmetric, no tenderness/mass/nodules Lungs: clear to auscultation bilaterally Heart: regular rate and rhythm, S1, S2 normal, no murmur, click, rub or gallop Extremities: extremities normal, atraumatic, no cyanosis or edema Pulses: 2+ and symmetric Skin: Skin color, texture, turgor normal. No rashes or lesions Neurologic: Alert and oriented X 3, normal strength and tone. Normal symmetric reflexes. Normal coordination and gait  EKG not performed today  ASSESSMENT AND PLAN:   Essential hypertension History of essential hypertension blood pressure measured today 136/72.  She is on amlodipine and metoprolol.  Dyslipidemia History of dyslipidemia on statin therapy with lipid profile performed 11/20/2019 revealing total cholesterol 195, LDL of 115 and HDL of 62.  Palpitations History of palpitations with event monitor performed in 2017 that showed sinus rhythm.  She does admit to drinking excessive amount of caffeine on a daily basis.  Morbid obesity (HCC) BMI of 39.  Patient expresses a desire for weight loss.  We will refer her to Dr. Leafy Walsh at the [facility] diagnostic center      Lorretta Harp MD [facility] 05/28/2020 8:53 AM

## 2020-05-30 ENCOUNTER — Ambulatory Visit: Payer: Medicare Other | Admitting: Gastroenterology

## 2020-06-05 ENCOUNTER — Ambulatory Visit (INDEPENDENT_AMBULATORY_CARE_PROVIDER_SITE_OTHER): Payer: Medicare Other | Admitting: Student

## 2020-06-05 ENCOUNTER — Other Ambulatory Visit: Payer: Self-pay

## 2020-06-05 VITALS — HR 68 | Temp 98.5°F | Ht 59.0 in | Wt 191.6 lb

## 2020-06-05 DIAGNOSIS — R42 Dizziness and giddiness: Secondary | ICD-10-CM | POA: Diagnosis present

## 2020-06-05 NOTE — Progress Notes (Signed)
CC: Dizziness, headache, ringing in ears  HPI:  [name] is a 65 y.o. female with a past medical history stated below and presents today for dizziness, headache, ringing in ears, please see problem based assessment and plan for additional details.  Past Medical History:  Diagnosis Date  . Anemia   . Back pain   . Endometrial cancer ([place])   . Endometrial polyp   . GERD (gastroesophageal reflux disease)   . Heart murmur   . Heart palpitations   . History of blood transfusion   . History of chemotherapy   . History of radiation therapy 09/02/2015-10/24/2015   Endometrium; Dr. Gery Walsh  . History of uterine fibroid   . Hyperlipidemia   . Hypertension   . Hypothyroidism   . IBS (irritable bowel syndrome)   . Joint pain   . OSA (obstructive sleep apnea)    moderate OSA per study 06-21-2005--  per pt does need cpap anymore never purchased it  . Osteoarthritis   . Other fatigue   . Other specified disorders of thyroid   . PMB (postmenopausal bleeding)   . Shortness of breath on exertion   . Wears dentures    upper  . Wears glasses     Current Outpatient Medications on File Prior to Visit  Medication Sig Dispense Refill  . allopurinol (ZYLOPRIM) 100 MG tablet Take 1 tablet by mouth once daily 90 tablet 0  . amLODipine (NORVASC) 10 MG tablet Take 1 tablet (10 mg total) by mouth daily. 30 tablet 11  . EUTHYROX 112 MCG tablet Take 1 tablet by mouth once daily 30 tablet 5  . fluticasone (FLONASE) 50 MCG/ACT nasal spray USE 1-2 SPRAY(S) IN EACH NOSTRIL AS NEEDED FOR TREATMENT OF ALLERGIES 16 g 0  . metoprolol succinate (TOPROL-XL) 50 MG 24 hr tablet TAKE 3 TABLETS BY MOUTH ONCE DAILY WITH MEALS OR  IMMEDIATELY  FOLLOWING (Patient taking differently: TAKE 1 1/2 TABLETS BY MOUTH ONCE DAILY WITH MEALS OR  IMMEDIATELY  FOLLOWING) 270 tablet 1  . Multiple Vitamins-Minerals (CENTRUM PO) Take by mouth.    Marland Kitchen omeprazole (PRILOSEC) 40 MG capsule Take 1 capsule (40 mg total) by  mouth daily. 30 capsule 2  . potassium chloride (KLOR-CON) 10 MEQ tablet Take 1 tablet (10 mEq total) by mouth daily. 90 tablet 0  . rosuvastatin (CRESTOR) 20 MG tablet Take 1 tablet by mouth once daily 90 tablet 0  . spironolactone (ALDACTONE) 25 MG tablet Take 3 tablets by mouth once daily 270 tablet 1  . [DISCONTINUED] esomeprazole (NEXIUM) 20 MG capsule Take 2 capsules (40 mg total) by mouth daily before breakfast. 30 capsule 3   No current facility-administered medications on file prior to visit.    Family History  Problem Relation Age of Onset  . Hypertension Mother   . Heart failure Mother   . Sudden death Mother   . Stroke Mother   . Cirrhosis Father        alcoholic  . Liver disease Father   . Alcoholism Father   . Heart disease Son   . Colon cancer Neg Hx   . Stomach cancer Neg Hx   . Rectal cancer Neg Hx   . Esophageal cancer Neg Hx   . Liver cancer Neg Hx     Social History   Socioeconomic History  . Marital status: Widowed    Spouse name: Not on file  . Number of children: 3  . Years of education: Not on file  .  Highest education level: Not on file  Occupational History  . Occupation: Production manager, [employer]  Tobacco Use  . Smoking status: Former Smoker    Years: 2.00    Types: Cigarettes    Quit date: 02/28/1994    Years since quitting: 26.2  . Smokeless tobacco: Never Used  Vaping Use  . Vaping Use: Never used  Substance and Sexual Activity  . Alcohol use: No    Alcohol/week: 0.0 standard drinks  . Drug use: No  . Sexual activity: Not Currently    Birth control/protection: Post-menopausal  Other Topics Concern  . Not on file  Social History Narrative   Widowed she has 3 sons one died from heart issues.   She is employed with security at [employer]   No alcohol tobacco or drug use   Social Determinants of Radio broadcast assistant Strain: Not on file  Food Insecurity: Not on file  Transportation Needs: Not on file   Physical Activity: Not on file  Stress: Not on file  Social Connections: Not on file  Intimate Partner Violence: Not on file   Review of Systems: ROS negative except for what is noted on the assessment and plan.  Vitals:   06/05/20 0840  Pulse: 68  Temp: 98.5 F (36.9 C)  TempSrc: Oral  Weight: 191 lb 9.6 oz (86.9 kg)  Height: 4\' 11"  (1.499 m)   Physical Exam: Constitutional: Well-appearing, no acute distress HENT: normocephalic atraumatic, mucous membranes moist, TMs intact bilaterally, no erythema or bulging or fluid present. Eyes: conjunctiva non-erythematous.  Extraocular motions intact.  No nystagmus appreciated.  Pupils equal round reactive to light. Neck: supple Cardiovascular: regular rate and rhythm, no m/r/g Pulmonary/Chest: normal work of breathing on room air MSK: normal bulk and tone Neurological: alert & oriented x 3, upper and lower extremity strength intact, 5/5.  Normal sensation throughout.  Gait intact, no ataxia present. Skin: warm and dry.   Psych: Normal mood and thought process  Assessment & Plan:   See Encounters Tab for problem based charting.  Patient discussed with Dr. Laurena Slimmer, D.O. [facility], PGY-1 Pager: [phone], Phone: [phone] Date 06/05/2020 Time 5:18 PM

## 2020-06-05 NOTE — Assessment & Plan Note (Signed)
Assessment: Lydia Walsh presents clinic today with concern for dizziness as well as associated symptoms of frontal headache as well as ringing in her ears.  This point states that the dizziness started 3 days ago which she describes it as a lightheadedness with loss of balance.  She denies any room spinning.  She denies double vision/diplopia.  She denies any dysarthria or ataxia.  With this dizziness she is also had a frontal headache that is aching in quality.  The worst it is is a 7/10.  These episodes last 15 minutes.  They are worse when standing and alleviated when lying flat.  They are associated with positional changes.  Denies take any medications to improve these headaches.  She denies any episodes of hearing loss, fevers, chills, nausea, vomiting.  Denies any shortness of breath or chest pain or back pain.  Does endorse associated ringing in her ears.  On physical exam patient does not have any difficulty with ambulation.  She is able to go from a sitting to standing position with mild dizziness.  Please see vital signs when lying flat,sitting, standing as per below.  On physical exam she is neurologically intact.  There is no nystagmus, pupils equal round reactive to light.  Head impulse test was not negative.  Patient was able to fixate on my nose during rapid passive head turning.  With patient's current symptoms, difficult to discern whether peripheral or central etiology and with a past medical history of endometrial cancer as well as evidence of vascular disease (CAD), will order urgent MRI of the brain without contrast to be performed within the next few weeks. There is low concern for metastasis, low percentage of endometrial cancers metastasize to the brain. I do not believe emergent MRI or admission is warranted at this time.  Patient instructed to call our clinic if things worsen, or go to closest ED if she believes she is having an emergency.  Orthostatic vital signs Lying  138/72, HR 68 Seated 129/62, HR 62 Standing 141/74, HR 73 Standing 3 min 145/71, HR 73  Plan: -MRI without contrast ordered -Patient to return to clinic sooner if symptoms persist and do not improve, or go to ED if she believes she is having medical emergency.

## 2020-06-05 NOTE — Patient Instructions (Signed)
Thank you, [name] for allowing [place] to provide your care today. Today we discussed  Dizziness, ringing in ears, headache I am sorry that you are having these episodes! If you find yourself to be more off balance, falling, having episodes of nausea and vomiting, please go to your closest ED. If you believe you are having an emergency, please go to the closest emergency department or call 911.   I have ordered the following labs for you:  Lab Orders  No laboratory test(s) ordered today     Tests ordered today:  MRI Brain  Referrals ordered today:   Referral Orders  No referral(s) requested today     I have ordered the following medication/changed the following medications:   Stop the following medications: There are no discontinued medications.   Start the following medications: No orders of the defined types were placed in this encounter.    Follow up: 1 month    Remember: They will be calling you to schedule your MRI   Should you have any questions or concerns please call the internal medicine clinic at [phone].     Sanjuana Letters, D.O. [place]

## 2020-06-06 NOTE — Progress Notes (Signed)
Internal Medicine Clinic Attending  Case discussed with Dr. Katsadouros  At the time of the visit.  We reviewed the resident's history and exam and pertinent patient test results.  I agree with the assessment, diagnosis, and plan of care documented in the resident's note.  

## 2020-06-08 ENCOUNTER — Encounter: Payer: Self-pay | Admitting: *Deleted

## 2020-06-12 ENCOUNTER — Ambulatory Visit
Admission: EM | Admit: 2020-06-12 | Discharge: 2020-06-12 | Disposition: A | Discharge status: Home / Self Care | Payer: Medicare Other | Attending: Emergency Medicine | Admitting: Emergency Medicine

## 2020-06-12 ENCOUNTER — Other Ambulatory Visit: Payer: Self-pay

## 2020-06-12 ENCOUNTER — Encounter: Payer: Self-pay | Admitting: Emergency Medicine

## 2020-06-12 DIAGNOSIS — W57XXXA Bitten or stung by nonvenomous insect and other nonvenomous arthropods, initial encounter: Secondary | ICD-10-CM

## 2020-06-12 DIAGNOSIS — S90862A Insect bite (nonvenomous), left foot, initial encounter: Secondary | ICD-10-CM

## 2020-06-12 MED ORDER — TRIAMCINOLONE ACETONIDE 0.1 % EX CREA: 1.0000 "application " | TOPICAL_CREAM | Freq: Two times a day (BID) | CUTANEOUS | 0 refills | Status: DC

## 2020-06-12 MED ORDER — LORATADINE 10 MG PO TABS: 10.0000 mg | ORAL_TABLET | Freq: Every day | ORAL | 0 refills | Status: DC

## 2020-06-12 MED ORDER — CEPHALEXIN 500 MG PO CAPS: 500.0000 mg | ORAL_CAPSULE | Freq: Four times a day (QID) | ORAL | 0 refills | Status: AC

## 2020-06-12 NOTE — ED Provider Notes (Signed)
[facility]    CSN: [id number] Arrival date & time: 06/12/20  0836      History   Chief Complaint Chief Complaint  Patient presents with  . Insect Bite    HPI Lydia Walsh is a 65 y.o. female history of GERD presenting today for evaluation of ant bites.  Reports 3 days ago excellently stepped in an ant hill and sustained ant bites to left foot and left wrist.  Main concern has been her foot.  Associated itching and redness.  Denies significant pain.  HPI  Past Medical History:  Diagnosis Date  . Anemia   . Back pain   . Endometrial cancer ([place])   . Endometrial polyp   . GERD (gastroesophageal reflux disease)   . Heart murmur   . Heart palpitations   . History of blood transfusion   . History of chemotherapy   . History of radiation therapy 09/02/2015-10/24/2015   Endometrium; Dr. Gery Pray  . History of uterine fibroid   . Hyperlipidemia   . Hypertension   . Hypothyroidism   . IBS (irritable bowel syndrome)   . Joint pain   . OSA (obstructive sleep apnea)    moderate OSA per study 06-21-2005--  per pt does need cpap anymore never purchased it  . Osteoarthritis   . Other fatigue   . Other specified disorders of thyroid   . PMB (postmenopausal bleeding)   . Shortness of breath on exertion   . Wears dentures    upper  . Wears glasses     Patient Active Problem List   Diagnosis Date Noted  . Morbid obesity ([place]) 05/28/2020  . Dysuria 04/12/2020  . Throat discomfort 09/07/2019  . Coronary artery calcification seen on CT scan 09/09/2018  . Pain of upper abdomen 03/17/2018  . Primary osteoarthritis of knees, bilateral 11/18/2016  . AV block, 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  . Viral upper respiratory tract infection 07/06/2016  . Restless leg syndrome 06/01/2016  . Chronic pain of left knee 02/03/2016  . Odynophagia 09/03/2015  . Port catheter in place 08/20/2015  . Hypokalemia 07/24/2015  . International Federation of Gynecology and Obstetrics (FIGO)  stage IIIC1 malignant neoplasm of endometrium ([place]) 07/10/2015  . 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[place]) 07/10/2015  . Watery diarrhea 07/02/2015  . Rash 07/02/2015  . Renal mass, left 06/21/2015  . Nephrolithiasis 06/21/2015  . Obesity (BMI 30-39.9) 06/21/2015  . Iron deficiency anemia due to chronic blood loss 06/21/2015  . Endometrial cancer ([place]) 03/29/2015  . Muscle cramp, nocturnal 07/26/2014  . Palpitations 08/02/2013  . Major depression 06/02/2013  . Fatigue 05/02/2013  . Osteoarthritis   . Dizziness 04/25/2009  . LOW BACK PAIN, CHRONIC 02/27/2008  . GOUT 11/24/2007  . ALLERGIC RHINITIS, SEASONAL 03/09/2006  . G E R D 02/19/2006  . Hypothyroidism 02/17/2006  . Dyslipidemia 02/17/2006  . Essential hypertension 02/17/2006  . OBSTRUCTIVE SLEEP APNEA 06/21/2005    Past Surgical History:  Procedure Laterality Date  . ABDOMINAL HYSTERECTOMY    . BREAST EXCISIONAL BIOPSY Right    benign  . CHOLECYSTECTOMY    . CHOLECYSTECTOMY, LAPAROSCOPIC  2020  . COLONOSCOPY    . DILATATION & CURETTAGE/HYSTEROSCOPY WITH MYOSURE N/A 03/07/2015   Procedure: DILATATION & CURETTAGE/HYSTEROSCOPY WITH MYOSURE (POLYP);  Surgeon: Arvella Nigh, MD;  Location: [facility];  Service: Gynecology;  Laterality: N/A;  . DILATATION & CURRETTAGE/HYSTEROSCOPY WITH RESECTOCOPE  12-13-2002  &  09-26-2004   polyp/  submucosal fibroid  . ESOPHAGOGASTRODUODENOSCOPY    .  EXCISION BENIGN RIGHT BREAST MASS  age 29  . IR REMOVAL TUN ACCESS W/ PORT W/O FL MOD SED  07/17/2016  . KNEE ARTHROSCOPY W/ MENISCECTOMY Bilateral right 04-12-2006//  left 12-29-2006   and Chondroplasty  . port placement    . ROBOTIC ASSISTED TOTAL HYSTERECTOMY WITH BILATERAL SALPINGO OOPHERECTOMY Bilateral 05/02/2015   Procedure: XI ROBOTIC ASSISTED TOTAL HYSTERECTOMY WITH BILATERAL SALPINGO OOPHORECTOMY AND SENTINAL LYMPH NODE BIOPSY;  Surgeon: Janie Morning, MD;  Location: [facility];  Service: Gynecology;  Laterality: Bilateral;   . TRANSTHORACIC ECHOCARDIOGRAM  04-02-2010   normal LV, ef 65%  . TUBAL LIGATION  1980's    OB History    Gravida  3   Para  3   Term      Preterm      AB      Living        SAB      IAB      Ectopic      Multiple      Live Births               Home Medications    Prior to Admission medications   Medication Sig Start Date End Date Taking? Authorizing Provider  cephALEXin (KEFLEX) 500 MG capsule Take 1 capsule (500 mg total) by mouth 4 (four) times daily for 5 days. 06/12/20 06/17/20 Yes Jayveion Stalling C, PA-C  loratadine (CLARITIN) 10 MG tablet Take 1 tablet (10 mg total) by mouth daily. 06/12/20  Yes Blossie Raffel C, PA-C  triamcinolone cream (KENALOG) 0.1 % Apply 1 application topically 2 (two) times daily. 06/12/20  Yes Nick Stults C, PA-C  allopurinol (ZYLOPRIM) 100 MG tablet Take 1 tablet by mouth once daily 04/04/20   Seawell, Jaimie A, DO  amLODipine (NORVASC) 10 MG tablet Take 1 tablet (10 mg total) by mouth daily. 05/02/20 05/02/21  Marianna Payment, MD  EUTHYROX 112 MCG tablet Take 1 tablet by mouth once daily 05/22/20   Asencion Noble, MD  fluticasone [facility]) 50 MCG/ACT nasal spray USE 1-2 SPRAY(S) IN EACH NOSTRIL AS NEEDED FOR TREATMENT OF ALLERGIES 05/07/20   Marianna Payment, MD  metoprolol succinate (TOPROL-XL) 50 MG 24 hr tablet TAKE 3 TABLETS BY MOUTH ONCE DAILY WITH MEALS OR  IMMEDIATELY  FOLLOWING Patient taking differently: TAKE 1 1/2 TABLETS BY MOUTH ONCE DAILY WITH MEALS OR  IMMEDIATELY  FOLLOWING 03/01/20   Cato Mulligan, MD  Multiple Vitamins-Minerals (CENTRUM PO) Take by mouth.    [provider]  omeprazole (PRILOSEC) 40 MG capsule Take 1 capsule (40 mg total) by mouth daily. 05/02/20 07/31/20  Marianna Payment, MD  potassium chloride (KLOR-CON) 10 MEQ tablet Take 1 tablet (10 mEq total) by mouth daily. 05/02/20   Marianna Payment, MD  rosuvastatin (CRESTOR) 20 MG tablet Take 1 tablet by mouth once daily 04/04/20   Lorretta Harp, MD   spironolactone (ALDACTONE) 25 MG tablet Take 3 tablets by mouth once daily 11/30/19   Rehman, Areeg N, DO  esomeprazole (NEXIUM) 20 MG capsule Take 2 capsules (40 mg total) by mouth daily before breakfast. 10/15/10 04/08/11  Rosalia Hammers, MD    Family History Family History  Problem Relation Age of Onset  . Hypertension Mother   . Heart failure Mother   . Sudden death Mother   . Stroke Mother   . Cirrhosis Father        alcoholic  . Liver disease Father   . Alcoholism Father   . Heart disease Son   .  Colon cancer Neg Hx   . Stomach cancer Neg Hx   . Rectal cancer Neg Hx   . Esophageal cancer Neg Hx   . Liver cancer Neg Hx     Social History Social History   Tobacco Use  . Smoking status: Former Smoker    Years: 2.00    Types: Cigarettes    Quit date: 02/28/1994    Years since quitting: 26.3  . Smokeless tobacco: Never Used  Vaping Use  . Vaping Use: Never used  Substance Use Topics  . Alcohol use: No    Alcohol/week: 0.0 standard drinks  . Drug use: No     Allergies   Shrimp (diagnostic) and Hctz [hydrochlorothiazide]   Review of Systems Review of Systems  Constitutional: Negative for fatigue and fever.  HENT: Negative for mouth sores.   Eyes: Negative for visual disturbance.  Respiratory: Negative for shortness of breath.   Cardiovascular: Negative for chest pain.  Gastrointestinal: Negative for abdominal pain, nausea and vomiting.  Genitourinary: Negative for genital sores.  Musculoskeletal: Negative for arthralgias and joint swelling.  Skin: Positive for color change. Negative for rash and wound.  Neurological: Negative for dizziness, weakness, light-headedness and headaches.     Physical Exam Triage Vital Signs ED Triage Vitals  Enc Vitals Group     BP 06/12/20 0937 (!) 157/85     Pulse Rate 06/12/20 0937 87     Resp 06/12/20 0937 18     Temp 06/12/20 0937 97.9 F (36.6 C)     Temp Source 06/12/20 0937 Oral     SpO2 06/12/20 0937 97 %      Weight --      Height --      Head Circumference --      Peak Flow --      Pain Score 06/12/20 0938 3     Pain Loc --      Pain Edu? --      Excl. in [place]? --    No data found.  Updated Vital Signs BP (!) 157/85 (BP Location: Left Arm)   Pulse 87   Temp 97.9 F (36.6 C) (Oral)   Resp 18   LMP 07/16/2014   SpO2 97%   Visual Acuity Right Eye Distance:   Left Eye Distance:   Bilateral Distance:    Right Eye Near:   Left Eye Near:    Bilateral Near:     Physical Exam Vitals and nursing note reviewed.  Constitutional:      Appearance: She is well-developed.     Comments: No acute distress  HENT:     Head: Normocephalic and atraumatic.     Nose: Nose normal.  Eyes:     Conjunctiva/sclera: Conjunctivae normal.  Cardiovascular:     Rate and Rhythm: Normal rate.  Pulmonary:     Effort: Pulmonary effort is normal. No respiratory distress.  Abdominal:     General: There is no distension.  Musculoskeletal:        General: Normal range of motion.     Cervical back: Neck supple.  Skin:    General: Skin is warm and dry.     Comments: Left wrist with isolated erythematous papule with central punctate lesion, no surrounding erythema or warmth  Left foot: Dorsum of foot with multiple elevated papular erythematous lesions with central punctate scab, mild surrounding erythema without warmth induration  Full active range of motion of left foot and wrist  Neurological:     Mental Status:  She is alert and oriented to person, place, and time.      UC Treatments / Results  Labs (all labs ordered are listed, but only abnormal results are displayed) Labs Reviewed - No data to display  EKG   Radiology No results found.  Procedures Procedures (including critical care time)  Medications Ordered in UC Medications - No data to display  Initial Impression / Assessment and Plan / UC Course  I have reviewed the triage vital signs and the nursing notes.  Pertinent labs &  imaging results that were available during my care of the patient were reviewed by me and considered in my medical decision making (see chart for details).     Suspect likely localized reaction from ant bites-recommending antihistamines and topical triamcinolone, recommended deferring antibiotics, low suspicion of cellulitis at this time, but did provide Keflex to fill if developing any increased pain or deeper redness noted to foot.  Discussed strict return precautions. Patient verbalized understanding and is agreeable with plan.  Final Clinical Impressions(s) / UC Diagnoses   Final diagnoses:  Insect bite of left foot with local reaction, initial encounter     Discharge Instructions     Daily Claritin/loratadine to help with itching, may use Benadryl at bedtime Triamcinolone twice daily to foot/bites May use prescription for Keflex only if developing increased pain redness swelling or warmth to foot  Please follow-up if not improving or worsening    ED Prescriptions    Medication Sig Dispense Auth. Provider   triamcinolone cream (KENALOG) 0.1 % Apply 1 application topically 2 (two) times daily. 30 g Tennille Montelongo C, PA-C   loratadine (CLARITIN) 10 MG tablet Take 1 tablet (10 mg total) by mouth daily. 10 tablet Ajmal Kathan C, PA-C   cephALEXin (KEFLEX) 500 MG capsule Take 1 capsule (500 mg total) by mouth 4 (four) times daily for 5 days. 20 capsule Tod Abrahamsen, Celeste C, PA-C     PDMP not reviewed this encounter.   Joneen Caraway [place] C, PA-C 06/12/20 1015

## 2020-06-12 NOTE — ED Triage Notes (Signed)
Pt here for ant bites to left foot and left wrist x 3 days ago; pt sts itching

## 2020-06-12 NOTE — Discharge Instructions (Signed)
Daily Claritin/loratadine to help with itching, may use Benadryl at bedtime Triamcinolone twice daily to foot/bites May use prescription for Keflex only if developing increased pain redness swelling or warmth to foot  Please follow-up if not improving or worsening

## 2020-06-18 ENCOUNTER — Other Ambulatory Visit: Payer: Self-pay | Admitting: Internal Medicine

## 2020-06-18 DIAGNOSIS — I10 Essential (primary) hypertension: Secondary | ICD-10-CM

## 2020-06-25 ENCOUNTER — Ambulatory Visit: Payer: Medicare Other | Admitting: Gastroenterology

## 2020-06-26 ENCOUNTER — Ambulatory Visit (HOSPITAL_COMMUNITY): Admission: RE | Admit: 2020-06-26 | Payer: Medicare Other | Source: Ambulatory Visit

## 2020-06-27 ENCOUNTER — Other Ambulatory Visit: Payer: Self-pay

## 2020-06-27 ENCOUNTER — Ambulatory Visit (INDEPENDENT_AMBULATORY_CARE_PROVIDER_SITE_OTHER)
Admission: RE | Admit: 2020-06-27 | Discharge: 2020-06-27 | Disposition: A | Discharge status: Home / Self Care | Payer: Self-pay | Source: Ambulatory Visit | Attending: Cardiovascular Disease | Admitting: Cardiovascular Disease

## 2020-06-27 DIAGNOSIS — I10 Essential (primary) hypertension: Secondary | ICD-10-CM

## 2020-07-02 ENCOUNTER — Telehealth: Payer: Self-pay

## 2020-07-02 DIAGNOSIS — E785 Hyperlipidemia, unspecified: Secondary | ICD-10-CM

## 2020-07-02 MED ORDER — ROSUVASTATIN CALCIUM 40 MG PO TABS: 40.0000 mg | ORAL_TABLET | Freq: Every day | ORAL | 1 refills | Status: DC

## 2020-07-02 NOTE — Telephone Encounter (Signed)
Per Dr. Gwenlyn Found: LDL 115 on Crestor 20 mg with elevated CCS. Increase to 40 mg and re check FLP-2 months    Spoke with pt regarding Coronary Calcium score as well as recent lipid panel. Per Dr. Gwenlyn Found we will increase rosuvastatin to 40mg  daily and recheck lipid panel in 2 months. All questions answered. Pt verbalizes understanding.   Prescription sent to pharmacy. Lab orders placed.

## 2020-07-06 ENCOUNTER — Other Ambulatory Visit: Payer: Self-pay

## 2020-07-06 ENCOUNTER — Ambulatory Visit
Admission: EM | Admit: 2020-07-06 | Discharge: 2020-07-06 | Disposition: A | Discharge status: Home / Self Care | Payer: Medicare Other | Attending: Emergency Medicine | Admitting: Emergency Medicine

## 2020-07-06 DIAGNOSIS — N390 Urinary tract infection, site not specified: Secondary | ICD-10-CM | POA: Insufficient documentation

## 2020-07-06 LAB — POCT URINALYSIS DIP (MANUAL ENTRY)
Bilirubin, UA: NEGATIVE
Glucose, UA: NEGATIVE mg/dL
Nitrite, UA: NEGATIVE
Protein Ur, POC: >=300 mg/dL — AB
Spec Grav, UA: >=1.03 — AB (ref 1.010–1.025)
Urobilinogen, UA: 0.2 E.U./dL
pH, UA: 6 (ref 5.0–8.0)

## 2020-07-06 MED ORDER — PHENAZOPYRIDINE HCL 200 MG PO TABS: 200.0000 mg | ORAL_TABLET | Freq: Three times a day (TID) | ORAL | 0 refills | Status: DC

## 2020-07-06 MED ORDER — CEPHALEXIN 500 MG PO CAPS: 500.0000 mg | ORAL_CAPSULE | Freq: Two times a day (BID) | ORAL | 0 refills | Status: AC

## 2020-07-06 NOTE — ED Provider Notes (Signed)
[facility]    CSN: [id number] Arrival date & time: 07/06/20  1433      History   Chief Complaint Chief Complaint  Patient presents with   Urinary Frequency   Dysuria    HPI Lydia Walsh is a 65 y.o. female history of GERD, OSA, presenting today for evaluation of possible UTI.  Reports 3 days of dysuria, urinary frequency urgency and incomplete voiding.  Denies fevers nausea or vomiting.  Denies any abdominal pain or back pain.  HPI  Past Medical History:  Diagnosis Date   Anemia    Back pain    Endometrial cancer ([place])    Endometrial polyp    GERD (gastroesophageal reflux disease)    Heart murmur    Heart palpitations    History of blood transfusion    History of chemotherapy    History of radiation therapy 09/02/2015-10/24/2015   Endometrium; Dr. Gery Pray   History of uterine fibroid    Hyperlipidemia    Hypertension    Hypothyroidism    IBS (irritable bowel syndrome)    Joint pain    OSA (obstructive sleep apnea)    moderate OSA per study 06-21-2005--  per pt does need cpap anymore never purchased it   Osteoarthritis    Other fatigue    Other specified disorders of thyroid    PMB (postmenopausal bleeding)    Shortness of breath on exertion    Wears dentures    upper   Wears glasses     Patient Active Problem List   Diagnosis Date Noted   Morbid obesity ([place]) 05/28/2020   Dysuria 04/12/2020   Throat discomfort 09/07/2019   Coronary artery calcification seen on CT scan 09/09/2018   Pain of upper abdomen 03/17/2018   Primary osteoarthritis of knees, bilateral 11/18/2016   AV block, 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   Viral upper respiratory tract infection 07/06/2016   Restless leg syndrome 06/01/2016   Chronic pain of left knee 02/03/2016   Odynophagia 09/03/2015   Port catheter in place 08/20/2015   Hypokalemia 07/24/2015   International Federation 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 ([place])  07/10/2015   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[place]) 07/10/2015   Watery diarrhea 07/02/2015   Rash 07/02/2015   Renal mass, left 06/21/2015   Nephrolithiasis 06/21/2015   Obesity (BMI 30-39.9) 06/21/2015   Iron deficiency anemia due to chronic blood loss 06/21/2015   Endometrial cancer ([place]) 03/29/2015   Muscle cramp, nocturnal 07/26/2014   Palpitations 08/02/2013   Major depression 06/02/2013   Fatigue 05/02/2013   Osteoarthritis    Dizziness 04/25/2009   LOW BACK PAIN, CHRONIC 02/27/2008   GOUT 11/24/2007   ALLERGIC RHINITIS, SEASONAL 03/09/2006   G E R D 02/19/2006   Hypothyroidism 02/17/2006   Dyslipidemia 02/17/2006   Essential hypertension 02/17/2006   OBSTRUCTIVE SLEEP APNEA 06/21/2005    Past Surgical History:  Procedure Laterality Date   ABDOMINAL HYSTERECTOMY     BREAST EXCISIONAL BIOPSY Right    benign   CHOLECYSTECTOMY     CHOLECYSTECTOMY, LAPAROSCOPIC  2020   COLONOSCOPY     DILATATION & CURETTAGE/HYSTEROSCOPY WITH MYOSURE N/A 03/07/2015   Procedure: DILATATION & CURETTAGE/HYSTEROSCOPY WITH MYOSURE (POLYP);  Surgeon: Arvella Nigh, MD;  Location: [facility];  Service: Gynecology;  Laterality: N/A;   DILATATION & CURRETTAGE/HYSTEROSCOPY WITH RESECTOCOPE  12-13-2002  &  09-26-2004   polyp/  submucosal fibroid   ESOPHAGOGASTRODUODENOSCOPY     EXCISION BENIGN RIGHT BREAST MASS  age 65   IR REMOVAL TUN ACCESS W/ PORT W/O FL MOD SED  07/17/2016   KNEE ARTHROSCOPY W/ MENISCECTOMY Bilateral right 04-12-2006//  left 12-29-2006   and Chondroplasty   port placement     ROBOTIC ASSISTED TOTAL HYSTERECTOMY WITH BILATERAL SALPINGO OOPHERECTOMY Bilateral 05/02/2015   Procedure: XI ROBOTIC ASSISTED TOTAL HYSTERECTOMY WITH BILATERAL SALPINGO OOPHORECTOMY AND SENTINAL LYMPH NODE BIOPSY;  Surgeon: Janie Morning, MD;  Location: [facility];  Service: Gynecology;  Laterality: Bilateral;   TRANSTHORACIC ECHOCARDIOGRAM  04-02-2010   normal LV, ef 65%   TUBAL LIGATION   1980's    OB History     Gravida  3   Para  3   Term      Preterm      AB      Living         SAB      IAB      Ectopic      Multiple      Live Births               Home Medications    Prior to Admission medications   Medication Sig Start Date End Date Taking? Authorizing Provider  cephALEXin (KEFLEX) 500 MG capsule Take 1 capsule (500 mg total) by mouth 2 (two) times daily for 5 days. 07/06/20 07/11/20 Yes Dquan Cortopassi C, PA-C  phenazopyridine (PYRIDIUM) 200 MG tablet Take 1 tablet (200 mg total) by mouth 3 (three) times daily. 07/06/20  Yes Arista Kettlewell C, PA-C  allopurinol (ZYLOPRIM) 100 MG tablet Take 1 tablet by mouth once daily 04/04/20   Seawell, Jaimie A, DO  amLODipine (NORVASC) 10 MG tablet Take 1 tablet (10 mg total) by mouth daily. 05/02/20 05/02/21  Marianna Payment, MD  EUTHYROX 112 MCG tablet Take 1 tablet by mouth once daily 05/22/20   Asencion Noble, MD  fluticasone [facility]) 50 MCG/ACT nasal spray USE 1-2 SPRAY(S) IN EACH NOSTRIL AS NEEDED FOR TREATMENT OF ALLERGIES 05/07/20   Marianna Payment, MD  metoprolol succinate (TOPROL-XL) 50 MG 24 hr tablet TAKE 3 TABLETS BY MOUTH ONCE DAILY WITH MEALS OR  IMMEDIATELY  FOLLOWING Patient taking differently: TAKE 1 1/2 TABLETS BY MOUTH ONCE DAILY WITH MEALS OR  IMMEDIATELY  FOLLOWING 03/01/20   Cato Mulligan, MD  Multiple Vitamins-Minerals (CENTRUM PO) Take by mouth.    [provider]  omeprazole (PRILOSEC) 40 MG capsule Take 1 capsule (40 mg total) by mouth daily. 05/02/20 07/31/20  Marianna Payment, MD  potassium chloride (KLOR-CON) 10 MEQ tablet Take 1 tablet (10 mEq total) by mouth daily. 05/02/20   Marianna Payment, MD  rosuvastatin (CRESTOR) 40 MG tablet Take 1 tablet (40 mg total) by mouth daily. 07/02/20   Lorretta Harp, MD  spironolactone (ALDACTONE) 25 MG tablet Take 3 tablets by mouth once daily 06/18/20   Cato Mulligan, MD  triamcinolone cream (KENALOG) 0.1 % Apply 1 application topically 2  (two) times daily. 06/12/20   Hayven Croy C, PA-C  esomeprazole (NEXIUM) 20 MG capsule Take 2 capsules (40 mg total) by mouth daily before breakfast. 10/15/10 04/08/11  Rosalia Hammers, MD    Family History Family History  Problem Relation Age of Onset   Hypertension Mother    Heart failure Mother    Sudden death Mother    Stroke Mother    Cirrhosis Father        alcoholic   Liver disease Father    Alcoholism Father    Heart disease Son  Colon cancer Neg Hx    Stomach cancer Neg Hx    Rectal cancer Neg Hx    Esophageal cancer Neg Hx    Liver cancer Neg Hx     Social History Social History   Tobacco Use   Smoking status: Former    Years: 2.00    Pack years: 0.00    Types: Cigarettes    Quit date: 02/28/1994    Years since quitting: 26.3   Smokeless tobacco: Never  Vaping Use   Vaping Use: Never used  Substance Use Topics   Alcohol use: No    Alcohol/week: 0.0 standard drinks   Drug use: No     Allergies   Shrimp (diagnostic) and Hctz [hydrochlorothiazide]   Review of Systems Review of Systems  Constitutional:  Negative for fever.  Respiratory:  Negative for shortness of breath.   Cardiovascular:  Negative for chest pain.  Gastrointestinal:  Negative for abdominal pain, diarrhea, nausea and vomiting.  Genitourinary:  Positive for dysuria, frequency and urgency. Negative for flank pain, genital sores, hematuria, menstrual problem, vaginal bleeding, vaginal discharge and vaginal pain.  Musculoskeletal:  Negative for back pain.  Skin:  Negative for rash.  Neurological:  Negative for dizziness, light-headedness and headaches.    Physical Exam Triage Vital Signs ED Triage Vitals  Enc Vitals Group     BP      Pulse      Resp      Temp      Temp src      SpO2      Weight      Height      Head Circumference      Peak Flow      Pain Score      Pain Loc      Pain Edu?      Excl. in [place]?    No data found.  Updated Vital Signs BP 138/83 (BP Location:  Left Arm)   Pulse 79   Temp 98.1 F (36.7 C) (Oral)   Resp 18   LMP 07/16/2014   SpO2 98%   Visual Acuity Right Eye Distance:   Left Eye Distance:   Bilateral Distance:    Right Eye Near:   Left Eye Near:    Bilateral Near:     Physical Exam Vitals and nursing note reviewed.  Constitutional:      Appearance: She is well-developed.     Comments: No acute distress  HENT:     Head: Normocephalic and atraumatic.     Nose: Nose normal.  Eyes:     Conjunctiva/sclera: Conjunctivae normal.  Cardiovascular:     Rate and Rhythm: Normal rate.  Pulmonary:     Effort: Pulmonary effort is normal. No respiratory distress.  Abdominal:     General: There is no distension.  Musculoskeletal:        General: Normal range of motion.     Cervical back: Neck supple.  Skin:    General: Skin is warm and dry.  Neurological:     Mental Status: She is alert and oriented to person, place, and time.     UC Treatments / Results  Labs (all labs ordered are listed, but only abnormal results are displayed) Labs Reviewed  POCT URINALYSIS DIP (MANUAL ENTRY) - Abnormal; Notable for the following components:      Result Value   Clarity, UA cloudy (*)    Ketones, POC UA trace (5) (*)    Spec Grav, UA >=1.030 (*)  Blood, UA large (*)    Protein Ur, POC >=300 (*)    Leukocytes, UA Small (1+) (*)    All other components within normal limits  URINE CULTURE    EKG   Radiology No results found.  Procedures Procedures (including critical care time)  Medications Ordered in UC Medications - No data to display  Initial Impression / Assessment and Plan / UC Course  I have reviewed the triage vital signs and the nursing notes.  Pertinent labs & imaging results that were available during my care of the patient were reviewed by me and considered in my medical decision making (see chart for details).     UA with small leuks, large hemoglobin, consistent with UTI, will send for urine  culture.  Placing on Keflex, Pyridium as needed for dysuria, push fluids and monitor for gradual resolution.  Vital signs stable, low suspicion of pyelonephritis at this time.  Discussed strict return precautions. Patient verbalized understanding and is agreeable with plan.  Final Clinical Impressions(s) / UC Diagnoses   Final diagnoses:  Lower urinary tract infection, acute     Discharge Instructions      Please take keflex/cephalexin twice daily for 5 days Drink plenty of water Pyridium or over the counter azo for discomfort/pain Follow up if not improving      ED Prescriptions     Medication Sig Dispense Auth. Provider   phenazopyridine (PYRIDIUM) 200 MG tablet Take 1 tablet (200 mg total) by mouth 3 (three) times daily. 6 tablet Dayne Chait C, PA-C   cephALEXin (KEFLEX) 500 MG capsule Take 1 capsule (500 mg total) by mouth 2 (two) times daily for 5 days. 10 capsule Linley Moskal, [place] C, PA-C      PDMP not reviewed this encounter.   Janith Lima, [place] 07/06/20 1528

## 2020-07-06 NOTE — Discharge Instructions (Addendum)
Please take keflex/cephalexin twice daily for 5 days Drink plenty of water Pyridium or over the counter azo for discomfort/pain Follow up if not improving

## 2020-07-06 NOTE — ED Triage Notes (Signed)
Two day h/o urinary frequency and dysuria. Denies hematuria, urinary urgency and abdominal pain. No meds taken.

## 2020-07-07 ENCOUNTER — Other Ambulatory Visit: Payer: Self-pay | Admitting: Internal Medicine

## 2020-07-08 ENCOUNTER — Ambulatory Visit (HOSPITAL_COMMUNITY)
Admission: RE | Admit: 2020-07-08 | Discharge: 2020-07-08 | Disposition: A | Discharge status: Home / Self Care | Payer: Medicare Other | Source: Ambulatory Visit | Attending: Student in an Organized Health Care Education/Training Program | Admitting: Student in an Organized Health Care Education/Training Program

## 2020-07-08 ENCOUNTER — Other Ambulatory Visit: Payer: Self-pay

## 2020-07-08 DIAGNOSIS — R42 Dizziness and giddiness: Secondary | ICD-10-CM | POA: Diagnosis present

## 2020-07-09 LAB — URINE CULTURE

## 2020-07-23 ENCOUNTER — Ambulatory Visit: Payer: Medicare Other | Admitting: Gastroenterology

## 2020-07-25 ENCOUNTER — Ambulatory Visit (INDEPENDENT_AMBULATORY_CARE_PROVIDER_SITE_OTHER): Payer: Medicare Other | Admitting: Bariatrics

## 2020-07-25 ENCOUNTER — Other Ambulatory Visit: Payer: Self-pay

## 2020-07-25 ENCOUNTER — Encounter (INDEPENDENT_AMBULATORY_CARE_PROVIDER_SITE_OTHER): Payer: Self-pay | Admitting: Bariatrics

## 2020-07-25 VITALS — BP 120/71 | HR 68 | Temp 98.1°F | Ht 59.0 in | Wt 183.0 lb

## 2020-07-25 DIAGNOSIS — E782 Mixed hyperlipidemia: Secondary | ICD-10-CM

## 2020-07-25 DIAGNOSIS — E038 Other specified hypothyroidism: Secondary | ICD-10-CM

## 2020-07-25 DIAGNOSIS — Z6838 Body mass index (BMI) 38.0-38.9, adult: Secondary | ICD-10-CM

## 2020-07-30 NOTE — Progress Notes (Signed)
Chief Complaint:   OBESITY Lydia Walsh is here to discuss her progress with her obesity treatment plan along with follow-up of her obesity related diagnoses. Lydia Walsh is on the Category 2 Plan and states she is following her eating plan approximately 20% of the time. Lydia Walsh states she is not currently exercising.  Today's visit was #: 3 Starting weight: 189 lbs Starting date: 02/08/2020 Today's weight: 183 lbs Today's date: 07/25/2020 Total lbs lost to date: 6 Total lbs lost since last in-office visit: 7  Interim History: Lydia Walsh is down 7 lbs since her last visit. She was last here in 03/12/20. She cut back on fatty foods.  Subjective:   1. Mixed hyperlipidemia Odie is taking Crestor.  2. Other specified hypothyroidism She is taking Euthyrox.  Assessment/Plan:   1. Mixed hyperlipidemia Continue Crestor. Cardiovascular risk and specific lipid/LDL goals reviewed.  We discussed several lifestyle modifications today and Kynadee will continue to work on diet, exercise and weight loss efforts. Orders and follow up as documented in patient record.   Counseling Intensive lifestyle modifications are the first line treatment for this issue. Dietary changes: Increase soluble fiber. Decrease simple carbohydrates. Exercise changes: Moderate to vigorous-intensity aerobic activity 150 minutes per week if tolerated. Lipid-lowering medications: see documented in medical record.  2. Other specified hypothyroidism Continue current treatment plan. Patient with long-standing hypothyroidism, on levothyroxine therapy. She appears euthyroid. Orders and follow up as documented in patient record.  Counseling Good thyroid control is important for overall health. Supratherapeutic thyroid levels are dangerous and will not improve weight loss results. The correct way to take levothyroxine is fasting, with water, separated by at least 30 minutes from breakfast, and separated by more than 4 hours from calcium, iron,  multivitamins, acid reflux medications (PPIs).   3. Obesity, current BMI 37  Lydia Walsh is currently in the action stage of change. As such, her goal is to continue with weight loss efforts. She has agreed to the Category 2 Plan.   Meal planning Will adhere closely to the plan.  Exercise goals:  As is  Behavioral modification strategies: increasing lean protein intake, decreasing simple carbohydrates, increasing vegetables, increasing water intake, decreasing eating out, no skipping meals, meal planning and cooking strategies, keeping healthy foods in the home, and planning for success.  Lydia Walsh has agreed to follow-up with our clinic in 2 weeks, fasting. She was informed of the importance of frequent follow-up visits to maximize her success with intensive lifestyle modifications for her multiple health conditions.   Objective:   Blood pressure 120/71, pulse 68, temperature 98.1 F (36.7 C), height 4\' 11"  (1.499 m), weight 183 lb (83 kg), last menstrual period 07/16/2014, SpO2 96 %. Body mass index is 36.96 kg/m.  General: Cooperative, alert, well developed, in no acute distress. HEENT: Conjunctivae and lids unremarkable. Cardiovascular: Regular rhythm.  Lungs: Normal work of breathing. Neurologic: No focal deficits.   Lab Results  Component Value Date   CREATININE 0.85 05/02/2020   BUN 22 05/02/2020   NA 142 05/02/2020   K 4.6 05/02/2020   CL 102 05/02/2020   CO2 18 (L) 05/02/2020   Lab Results  Component Value Date   ALT 22 09/12/2018   AST 22 09/12/2018   ALKPHOS 106 09/12/2018   BILITOT 0.4 09/12/2018   Lab Results  Component Value Date   HGBA1C 5.6 11/02/2011   No results found for: INSULIN Lab Results  Component Value Date   TSH 1.330 05/02/2020   Lab Results  Component Value  Date   CHOL 195 11/20/2019   HDL 62 11/20/2019   LDLCALC 115 (H) 11/20/2019   TRIG 99 11/20/2019   CHOLHDL 3.1 11/20/2019   No results found for: VD25OH Lab Results  Component Value  Date   WBC 5.0 02/23/2020   HGB 13.7 02/23/2020   HCT 41.3 02/23/2020   MCV 87 02/23/2020   PLT 249 02/23/2020   Lab Results  Component Value Date   IRON 68 11/20/2019   TIBC 345 11/20/2019   FERRITIN 96 11/20/2019    Obesity Behavioral Intervention:   Approximately 15 minutes were spent on the discussion below.  ASK: We discussed the diagnosis of obesity with Bethena Roys today and Abrina agreed to give [place] permission to discuss obesity behavioral modification therapy today.  ASSESS: Lydia Walsh has the diagnosis of obesity and her BMI today is 37.0. Tykeria is in the action stage of change.   ADVISE: Darcell was educated on the multiple health risks of obesity as well as the benefit of weight loss to improve her health. She was advised of the need for long term treatment and the importance of lifestyle modifications to improve her current health and to decrease her risk of future health problems.  AGREE: Multiple dietary modification options and treatment options were discussed and Lydia Walsh agreed to follow the recommendations documented in the above note.  ARRANGE: Jennipher was educated on the importance of frequent visits to treat obesity as outlined per CMS and USPSTF guidelines and agreed to schedule her next follow up appointment today.  Attestation Statements:   Reviewed by clinician on day of visit: allergies, medications, problem list, medical history, surgical history, family history, social history, and previous encounter notes.  Coral Ceo, CMA, am acting as Location manager for [employer], DO.  I have reviewed the above documentation for accuracy and completeness, and I agree with the above. Jearld Lesch, DO

## 2020-07-31 ENCOUNTER — Encounter (INDEPENDENT_AMBULATORY_CARE_PROVIDER_SITE_OTHER): Payer: Self-pay | Admitting: Bariatrics

## 2020-08-06 ENCOUNTER — Other Ambulatory Visit: Payer: Self-pay | Admitting: Internal Medicine

## 2020-08-06 DIAGNOSIS — K219 Gastro-esophageal reflux disease without esophagitis: Secondary | ICD-10-CM

## 2020-08-13 ENCOUNTER — Ambulatory Visit (INDEPENDENT_AMBULATORY_CARE_PROVIDER_SITE_OTHER): Payer: Medicare Other | Admitting: Bariatrics

## 2020-08-14 NOTE — Addendum Note (Signed)
Encounter addended by: Annie Paras on: 08/14/2020 4:11 PM  Actions taken: Letter saved

## 2020-08-19 ENCOUNTER — Other Ambulatory Visit: Payer: Self-pay

## 2020-08-19 ENCOUNTER — Other Ambulatory Visit: Payer: Self-pay | Admitting: Podiatry

## 2020-08-19 ENCOUNTER — Encounter: Payer: Self-pay | Admitting: Podiatry

## 2020-08-19 ENCOUNTER — Ambulatory Visit (INDEPENDENT_AMBULATORY_CARE_PROVIDER_SITE_OTHER): Payer: Medicare Other

## 2020-08-19 ENCOUNTER — Ambulatory Visit (INDEPENDENT_AMBULATORY_CARE_PROVIDER_SITE_OTHER): Payer: Medicare Other | Admitting: Podiatry

## 2020-08-19 DIAGNOSIS — M7751 Other enthesopathy of right foot: Secondary | ICD-10-CM | POA: Diagnosis not present

## 2020-08-19 DIAGNOSIS — M109 Gout, unspecified: Secondary | ICD-10-CM

## 2020-08-19 DIAGNOSIS — M2061 Acquired deformities of toe(s), unspecified, right foot: Secondary | ICD-10-CM | POA: Diagnosis not present

## 2020-08-19 DIAGNOSIS — M2021 Hallux rigidus, right foot: Secondary | ICD-10-CM

## 2020-08-19 DIAGNOSIS — M1000 Idiopathic gout, unspecified site: Secondary | ICD-10-CM | POA: Diagnosis not present

## 2020-08-21 ENCOUNTER — Telehealth: Payer: Self-pay | Admitting: Podiatry

## 2020-08-21 MED ORDER — METHYLPREDNISOLONE 4 MG PO TBPK: ORAL_TABLET | ORAL | 0 refills | Status: DC

## 2020-08-21 NOTE — Telephone Encounter (Signed)
Patient called and stated she was suppose to have a steroid sent to her [facility]  Please call [phone]

## 2020-08-21 NOTE — Progress Notes (Signed)
Subjective:   Patient ID: Lydia Walsh, female   DOB: 65 y.o.   MRN: [id number]   HPI 65 year old female presents the office today for concerns of pain to her right big toe joint, bunion.  She also is concerned that she may have gout as she does have a history of this.  This been ongoing for last 6 days.  She denies recent injury or trauma.  The area does hurt with shoes/pressure.  No recent treatment.  No other concerns.   Review of Systems  All other systems reviewed and are negative.  Past Medical History:  Diagnosis Date   Anemia    Back pain    Endometrial cancer (HCC)    Endometrial polyp    GERD (gastroesophageal reflux disease)    Heart murmur    Heart palpitations    History of blood transfusion    History of chemotherapy    History of radiation therapy 09/02/2015-10/24/2015   Endometrium; Dr. Gery Pray   History of uterine fibroid    Hyperlipidemia    Hypertension    Hypothyroidism    IBS (irritable bowel syndrome)    Joint pain    OSA (obstructive sleep apnea)    moderate OSA per study 06-21-2005--  per pt does need cpap anymore never purchased it   Osteoarthritis    Other fatigue    Other specified disorders of thyroid    PMB (postmenopausal bleeding)    Shortness of breath on exertion    Wears dentures    upper   Wears glasses     Past Surgical History:  Procedure Laterality Date   ABDOMINAL HYSTERECTOMY     BREAST EXCISIONAL BIOPSY Right    benign   CHOLECYSTECTOMY     CHOLECYSTECTOMY, LAPAROSCOPIC  2020   COLONOSCOPY     DILATATION & CURETTAGE/HYSTEROSCOPY WITH MYOSURE N/A 03/07/2015   Procedure: DILATATION & CURETTAGE/HYSTEROSCOPY WITH MYOSURE (POLYP);  Surgeon: Arvella Nigh, MD;  Location: [facility];  Service: Gynecology;  Laterality: N/A;   DILATATION & CURRETTAGE/HYSTEROSCOPY WITH RESECTOCOPE  12-13-2002  &  09-26-2004   polyp/  submucosal fibroid   ESOPHAGOGASTRODUODENOSCOPY     EXCISION BENIGN RIGHT BREAST MASS  age 27    IR REMOVAL TUN ACCESS W/ PORT W/O FL MOD SED  07/17/2016   KNEE ARTHROSCOPY W/ MENISCECTOMY Bilateral right 04-12-2006//  left 12-29-2006   and Chondroplasty   port placement     ROBOTIC ASSISTED TOTAL HYSTERECTOMY WITH BILATERAL SALPINGO OOPHERECTOMY Bilateral 05/02/2015   Procedure: XI ROBOTIC ASSISTED TOTAL HYSTERECTOMY WITH BILATERAL SALPINGO OOPHORECTOMY AND SENTINAL LYMPH NODE BIOPSY;  Surgeon: Janie Morning, MD;  Location: [facility];  Service: Gynecology;  Laterality: Bilateral;   TRANSTHORACIC ECHOCARDIOGRAM  04-02-2010   normal LV, ef 65%   TUBAL LIGATION  1980's     Current Outpatient Medications:    methylPREDNISolone (MEDROL DOSEPAK) 4 MG TBPK tablet, Take as directed, Disp: 21 tablet, Rfl: 0   allopurinol (ZYLOPRIM) 100 MG tablet, Take 1 tablet by mouth once daily, Disp: 90 tablet, Rfl: 0   amLODipine (NORVASC) 10 MG tablet, Take 1 tablet (10 mg total) by mouth daily., Disp: 30 tablet, Rfl: 11   EUTHYROX 112 MCG tablet, Take 1 tablet by mouth once daily, Disp: 30 tablet, Rfl: 5   fluticasone (FLONASE) 50 MCG/ACT nasal spray, USE 1-2 SPRAY(S) IN EACH NOSTRIL AS NEEDED FOR TREATMENT OF ALLERGIES, Disp: 16 g, Rfl: 0   metoprolol succinate (TOPROL-XL) 50 MG 24 hr tablet, TAKE 3 TABLETS BY  MOUTH ONCE DAILY WITH MEALS OR  IMMEDIATELY  FOLLOWING (Patient taking differently: TAKE 100 mg qd), Disp: 270 tablet, Rfl: 1   Multiple Vitamins-Minerals (CENTRUM PO), Take by mouth., Disp: , Rfl:    omeprazole (PRILOSEC) 40 MG capsule, Take 1 capsule by mouth once daily, Disp: 30 capsule, Rfl: 0   phenazopyridine (PYRIDIUM) 200 MG tablet, Take 1 tablet (200 mg total) by mouth 3 (three) times daily., Disp: 6 tablet, Rfl: 0   potassium chloride (KLOR-CON) 10 MEQ tablet, Take 1 tablet (10 mEq total) by mouth daily., Disp: 90 tablet, Rfl: 0   rosuvastatin (CRESTOR) 40 MG tablet, Take 1 tablet (40 mg total) by mouth daily., Disp: 90 tablet, Rfl: 1   spironolactone (ALDACTONE) 25 MG tablet, Take 3 tablets by  mouth once daily, Disp: 270 tablet, Rfl: 0   triamcinolone cream (KENALOG) 0.1 %, Apply 1 application topically 2 (two) times daily., Disp: 30 g, Rfl: 0  Allergies  Allergen Reactions   Shrimp (Diagnostic)     Anaphylaxis   Hctz [Hydrochlorothiazide] Rash          Objective:  Physical Exam  General: AAO x3, NAD  Dermatological: Skin is warm, dry and supple bilateral.  There are no open sores, no preulcerative lesions, no rash or signs of infection present.  Vascular: Dorsalis Pedis artery and Posterior Tibial artery pedal pulses are 2/4 bilateral with immedate capillary fill time. There is no pain with calf compression, swelling, warmth, erythema.   Neruologic: Grossly intact via light touch bilateral.   Musculoskeletal: Decreased range of motion of first MPJ of the third changes present the first MPJ.  Localized edema but there is no significant erythema or warmth.  No other area discomfort.  Muscular strength 5/5 in all groups tested bilateral.  Gait: Unassisted, Nonantalgic.       Assessment:   Capsulitis right first MPJ/arthritis versus gout     Plan:  -Treatment options discussed including all alternatives, risks, and complications -Etiology of symptoms were discussed -X-rays were obtained and reviewed with the patient.  Significant arthritic changes present the first MPJ. -Discussed steroid injection with less proceed with oral prednisone.  Prescribed Medrol Dosepak.  Discussed shoe modifications and offloading. -Uric acid, BMP ordered  Trula Slade DPM

## 2020-08-22 ENCOUNTER — Other Ambulatory Visit: Payer: Self-pay | Admitting: *Deleted

## 2020-08-22 DIAGNOSIS — I10 Essential (primary) hypertension: Secondary | ICD-10-CM

## 2020-08-22 MED ORDER — METOPROLOL SUCCINATE ER 50 MG PO TB24: ORAL_TABLET | ORAL | 1 refills | Status: DC

## 2020-08-26 ENCOUNTER — Telehealth: Payer: Self-pay | Admitting: *Deleted

## 2020-08-26 NOTE — Telephone Encounter (Addendum)
Information was sent through CoverMyMeds to [facility] for PA for Metoprolol Succinate XL 50 mg tablets.  Sander Nephew, RN 08/26/2020 9:40 AM. PA for Metoprolol 50 mg tablets was approved until 01/11/2021.  Sander Nephew, RN 8//16/2022 9:37 AM.

## 2020-08-28 ENCOUNTER — Ambulatory Visit: Payer: Medicare Other | Admitting: Gastroenterology

## 2020-09-03 ENCOUNTER — Telehealth: Payer: Self-pay | Admitting: *Deleted

## 2020-09-03 NOTE — Telephone Encounter (Signed)
CALLED PATIENT TO ASK ABOUT ALTERING FU ON 09-12-20 DUE TO DR. KINARD BEING IN THE OR, PATIENT AGREED TO COME ON 09-12-20 @ 8:30 AM

## 2020-09-05 ENCOUNTER — Telehealth: Payer: Self-pay | Admitting: *Deleted

## 2020-09-05 NOTE — Telephone Encounter (Signed)
Patient called in for an appt stating, "I've been cramping really bad around my ankles and hands." States she had to pull over today while she was driving 2/2 these sx. Also, states she's been vomiting. She is aware that our clinic is closed tomorrow. She asked for a referral to UC. Explained she does not need a referral. She will head to UC on [address] now.

## 2020-09-06 ENCOUNTER — Encounter: Payer: Self-pay | Admitting: Radiology

## 2020-09-07 NOTE — Telephone Encounter (Signed)
Sounds good

## 2020-09-09 ENCOUNTER — Other Ambulatory Visit: Payer: Self-pay | Admitting: Student

## 2020-09-09 DIAGNOSIS — K219 Gastro-esophageal reflux disease without esophagitis: Secondary | ICD-10-CM

## 2020-09-11 NOTE — Progress Notes (Signed)
Radiation Oncology         [phone] ________________________________  Name: Lydia Walsh MRN: [id number]  Date: 09/12/2020  DOB: 05/04/1955  Follow-Up Visit Note  CC: Wayland Denis, MD  Asencion Noble, MD    ICD-10-CM   1. International Federation 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 ([place])  C54.1       Diagnosis: Stage IIIC-1 grade 2 endometrioid adenocarcinoma   Interval Since Last Radiation:  4 years, 10 months, and 20 days   1) 09/02/2015 - 10/08/2015: External beam radiation to the pelvis - 45 Gy in 25 fractions, simultaneous integrated boost to suspicious node 50 Gy   2) 10/15/2015, 10/22/2015, 10/24/2015 - HDR vaginal brachytherapy - 18 Gy in 3 fractions.  Narrative:  The patient returns today for routine follow-up, she was last seen by me on 03/11/20. Since her last visit, the patient presented to Dr. Truman Hayward, [facility], with dysuria. Urine dipstick taken showed no significant findings at the time. She later presented to the [facility] on 07/06/20 with 3 days of dysuria, urinary frequency/ urgency, and incomplete voiding. UA collected during this visit indicated the presence of small leukocytes and large hemoglobin consistent with UTI. She was accordingly placed on Keflex and Pyridium as needed for dysuria pending urine culture. (Urine culture obtained indicated multiple species present).         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 Susceptibility artifact was also visualized in the left medial temporal occipital region which corresponded to an area of contrast enhancement previously seen on prior MRI. No acute intracranial abnormalities were otherwise seen.               She reports urinary symptoms as above.  She does have nocturia x3 and some occasional urinary incontinence.   She denies any abdominal bloating pelvic pain vaginal bleeding or discharge.  He denies any rectal bleeding or hematuria.  Allergies:  is allergic to shrimp (diagnostic) and hctz [hydrochlorothiazide].  Meds: Current Outpatient Medications  Medication Sig Dispense Refill   allopurinol (ZYLOPRIM) 100 MG tablet Take 1 tablet by mouth once daily 90 tablet 0   amLODipine (NORVASC) 10 MG tablet Take 1 tablet (10 mg total) by mouth daily. 30 tablet 11   EUTHYROX 112 MCG tablet Take 1 tablet by mouth once daily 30 tablet 5   fluticasone (FLONASE) 50 MCG/ACT nasal spray USE 1-2 SPRAY(S) IN EACH NOSTRIL AS NEEDED FOR TREATMENT OF ALLERGIES 16 g 0   metoprolol succinate (TOPROL-XL) 50 MG 24 hr tablet TAKE 3 TABLETS BY MOUTH ONCE DAILY WITH MEALS OR  IMMEDIATELY  FOLLOWING 270 tablet 1   Multiple Vitamins-Minerals (CENTRUM PO) Take by mouth.     omeprazole (PRILOSEC) 40 MG capsule Take 1 capsule by mouth once daily 30 capsule 0   potassium chloride (KLOR-CON) 10 MEQ tablet Take 1 tablet (10 mEq total) by mouth daily. 90 tablet 0   rosuvastatin (CRESTOR) 40 MG tablet Take 1 tablet (40 mg total) by mouth daily. 90 tablet 1   spironolactone (ALDACTONE) 25 MG tablet Take 3 tablets by mouth once daily 270 tablet 0   phenazopyridine (PYRIDIUM) 200 MG tablet Take 1 tablet (200 mg total) by mouth 3 (three) times daily. (Patient not taking: Reported on 09/12/2020) 6 tablet 0   triamcinolone cream (KENALOG) 0.1 % Apply 1 application topically 2 (two) times  daily. (Patient not taking: Reported on 09/12/2020) 30 g 0   No current facility-administered medications for this encounter.    Physical Findings: The patient is in no acute distress. Patient is alert and oriented.  height is '4\' 11"'$  (1.499 m) and weight is 178 lb 8 oz (81 kg). Her temporal temperature is 96.3 F (35.7 C) (abnormal). Her blood pressure is 138/67 and her pulse is 67. Her respiration is 18 and oxygen saturation is 100%. .  No significant changes.  Lungs are clear to auscultation bilaterally. Heart has regular rate and rhythm. No palpable cervical, supraclavicular, or axillary adenopathy. Abdomen soft, non-tender, normal bowel sounds.  On pelvic examination the external genitalia unremarkable.  A speculum exam is performed.  There are no mucosal lesions noted in the vaginal vault.  Bimanual examination there are no pelvic masses appreciated.  Vaginal cuff is intact.   Lab Findings: Lab Results  Component Value Date   WBC 5.0 02/23/2020   HGB 13.7 02/23/2020   HCT 41.3 02/23/2020   MCV 87 02/23/2020   PLT 249 02/23/2020    Radiographic Findings: DG Foot Complete Right  Result Date: 08/19/2020 Please see detailed radiograph report in office note.   Impression:  Stage IIIC-1 grade 2 endometrioid adenocarcinoma   No evidence of recurrence on clinical exam today.  I congratulated the patient on successful 5-year follow-up without recurrence.  We discussed that she can now be released from follow-up in oncology.  She will need to follow-up with a gynecologist for an exam once a year.  Plan: As needed follow-up in radiation oncology.  Patient will be set up to follow-up with her gynecologist here in [place].  Gynecologic oncology is identifying a gynecologist for this patient to follow-up with.   25 minutes of total time was spent for this patient encounter, including preparation, face-to-face counseling with the patient and coordination of care, physical exam, and documentation of the encounter. ____________________________________  Blair Promise, PhD, MD   This document serves as a record of services personally performed by Gery Pray, MD. It was created on his behalf by Roney Mans, a trained medical scribe. The creation of this record is based on the scribe's personal observations and the provider's statements to them. This document has been checked and approved by the attending provider.

## 2020-09-12 ENCOUNTER — Telehealth: Payer: Self-pay | Admitting: Oncology

## 2020-09-12 ENCOUNTER — Encounter: Payer: Self-pay | Admitting: Radiation Oncology

## 2020-09-12 ENCOUNTER — Other Ambulatory Visit: Payer: Self-pay

## 2020-09-12 ENCOUNTER — Ambulatory Visit
Admission: RE | Admit: 2020-09-12 | Discharge: 2020-09-12 | Disposition: A | Discharge status: Home / Self Care | Payer: Medicare Other | Source: Ambulatory Visit | Attending: Radiation Oncology | Admitting: Radiation Oncology

## 2020-09-12 VITALS — BP 138/67 | HR 67 | Temp 96.3°F | Resp 18 | Ht 59.0 in | Wt 178.5 lb

## 2020-09-12 DIAGNOSIS — C541 Malignant neoplasm of endometrium: Secondary | ICD-10-CM

## 2020-09-12 HISTORY — DX: Gout, unspecified: M10.9

## 2020-09-12 NOTE — Progress Notes (Signed)
Lydia Walsh is here today for follow up post radiation to the pelvic.  They completed their radiation on: 10/24/2015   Does the patient complain of any of the following:  Pain:denies Abdominal bloating: denies Diarrhea/Constipation: occasional diarrhea Nausea/Vomiting: denies Vaginal Discharge: denies Blood in Urine or Stool: denies Urinary Issues (dysuria/incomplete emptying/ incontinence/ increased frequency/urgency): incontinence, frequency, urgency, nocturia x3 Does patient report using vaginal dilator 2-3 times a week and/or sexually active 2-3 weeks: no Post radiation skin changes: denies   Additional comments if applicable: abdominal cramping  Vitals:   09/12/20 0821  BP: 138/67  Pulse: 67  Resp: 18  Temp: (!) 96.3 F (35.7 C)  TempSrc: Temporal  SpO2: 100%  Weight: 178 lb 8 oz (81 kg)  Height: '4\' 11"'$  (1.499 m)

## 2020-09-12 NOTE — Telephone Encounter (Signed)
Left a message regarding Gyn recommendations.  Requested a return call.

## 2020-09-13 NOTE — Telephone Encounter (Signed)
Lydia Walsh and discussed recommendations for gynecologists - [facility], [facility] and [employer] for [employer] at [employer].  She is going to call for an appointment for next year.

## 2020-09-17 ENCOUNTER — Ambulatory Visit: Payer: Medicare Other | Admitting: Podiatry

## 2020-09-24 ENCOUNTER — Ambulatory Visit: Payer: Medicare Other | Admitting: Gastroenterology

## 2020-10-05 ENCOUNTER — Other Ambulatory Visit: Payer: Self-pay | Admitting: Student

## 2020-10-05 ENCOUNTER — Other Ambulatory Visit: Payer: Self-pay | Admitting: Internal Medicine

## 2020-10-05 DIAGNOSIS — I10 Essential (primary) hypertension: Secondary | ICD-10-CM

## 2020-10-07 NOTE — Telephone Encounter (Signed)
Last appt was 06/05/20 with Dr Johnney Ou.

## 2020-10-11 ENCOUNTER — Other Ambulatory Visit: Payer: Self-pay | Admitting: Internal Medicine

## 2020-10-11 DIAGNOSIS — K219 Gastro-esophageal reflux disease without esophagitis: Secondary | ICD-10-CM

## 2020-10-18 ENCOUNTER — Other Ambulatory Visit: Payer: Self-pay

## 2020-10-18 ENCOUNTER — Encounter: Payer: Self-pay | Admitting: Cardiovascular Disease

## 2020-10-18 ENCOUNTER — Ambulatory Visit (INDEPENDENT_AMBULATORY_CARE_PROVIDER_SITE_OTHER): Payer: Medicare Other | Admitting: Cardiovascular Disease

## 2020-10-18 DIAGNOSIS — I10 Essential (primary) hypertension: Secondary | ICD-10-CM | POA: Diagnosis not present

## 2020-10-18 DIAGNOSIS — E785 Hyperlipidemia, unspecified: Secondary | ICD-10-CM | POA: Diagnosis not present

## 2020-10-18 DIAGNOSIS — R002 Palpitations: Secondary | ICD-10-CM | POA: Diagnosis not present

## 2020-10-18 DIAGNOSIS — I2510 Atherosclerotic heart disease of native coronary artery without angina pectoris: Secondary | ICD-10-CM

## 2020-10-18 LAB — LIPID PANEL
Chol/HDL Ratio: 2.9 ratio (ref 0.0–4.4)
Cholesterol, Total: 157 mg/dL (ref 100–199)
HDL: 55 mg/dL (ref >39)
LDL Chol Calc (NIH): 85 mg/dL (ref 0–99)
Triglycerides: 90 mg/dL (ref 0–149)
VLDL Cholesterol Cal: 17 mg/dL (ref 5–40)

## 2020-10-18 LAB — HEPATIC FUNCTION PANEL
ALT: 36 IU/L — ABNORMAL HIGH (ref 0–32)
AST: 34 IU/L (ref 0–40)
Albumin: 4.7 g/dL (ref 3.8–4.8)
Alkaline Phosphatase: 102 IU/L (ref 44–121)
Bilirubin Total: 0.6 mg/dL (ref 0.0–1.2)
Bilirubin, Direct: 0.15 mg/dL (ref 0.00–0.40)
Total Protein: 7.2 g/dL (ref 6.0–8.5)

## 2020-10-18 NOTE — Assessment & Plan Note (Signed)
History of essential hypertension a blood pressure measured today 127/68.  She is on amlodipine, metoprolol and spironolactone.

## 2020-10-18 NOTE — Assessment & Plan Note (Signed)
History of palpitations with a normal monitor in the past showing no arrhythmias.  Her palpitations have significantly improved since discontinuing caffeine intake.

## 2020-10-18 NOTE — Progress Notes (Signed)
10/18/2020 Lydia Walsh   18-Oct-1955  [id number]  Primary Physician Wayland Denis, MD Primary Cardiologist: Lorretta Harp MD [name], [place]  HPI:  Lydia Walsh is a 65 y.o.    moderately overweight divorced African American female mother of 3 children and is retired from [place] at [place]. She was referred by her PCP for evaluation of palpitations.  I last saw her in the office 05/28/2020.  She did see Rosaria Ferries, PA-C in the office 6 months later.  She's been evaluated for this in the past several years ago. She also has had a diagnosis of endometrial cancer in June 2017 she has no other cardiac risk factors. She does not drink caffeine. I ordered a 2-D echocardiogram which was entirely normal and the monitor that showed only sinus rhythm.  She did have a Port-A-Cath indwelling which was removed in 2018.   She  did have a CT scan performed 03/17/2018 that had incidental finding of coronary calcification .    She was complaining of atypical chest pain when I saw her a year ago but this is no longer is a symptom.   Since I saw her in the office 6 months ago she has discontinued her caffeine intake and her palpitations have significantly improved.  She denies chest pain or shortness of breath.  She did have a coronary calcium score performed 06/27/2020 which was 384 most of which was in the LAD and left main.  I did increase her rosuvastatin to 40 mg a day.  Her last LDL was 115 performed 11/20/2019.   Current Meds  Medication Sig   allopurinol (ZYLOPRIM) 100 MG tablet Take 1 tablet by mouth once daily   amLODipine (NORVASC) 10 MG tablet Take 1 tablet (10 mg total) by mouth daily.   EUTHYROX 112 MCG tablet Take 1 tablet by mouth once daily   fluticasone (FLONASE) 50 MCG/ACT nasal spray USE 1-2 SPRAY(S) IN EACH NOSTRIL AS NEEDED FOR TREATMENT OF ALLERGIES   metoprolol succinate (TOPROL-XL) 50 MG 24 hr tablet TAKE 3 TABLETS BY MOUTH ONCE DAILY WITH MEALS OR  IMMEDIATELY   FOLLOWING   Multiple Vitamins-Minerals (CENTRUM PO) Take by mouth.   omeprazole (PRILOSEC) 40 MG capsule Take 1 capsule by mouth once daily   potassium chloride (KLOR-CON) 10 MEQ tablet Take 1 tablet (10 mEq total) by mouth daily.   rosuvastatin (CRESTOR) 40 MG tablet Take 1 tablet (40 mg total) by mouth daily.   spironolactone (ALDACTONE) 25 MG tablet Take 3 tablets by mouth once daily   triamcinolone cream (KENALOG) 0.1 % Apply 1 application topically 2 (two) times daily.   [DISCONTINUED] phenazopyridine (PYRIDIUM) 200 MG tablet Take 1 tablet (200 mg total) by mouth 3 (three) times daily.     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   She is employed with security at [employer]   No alcohol tobacco or drug use   Social Determinants of Radio broadcast assistant Strain: Not on file  Food Insecurity: Not on file  Transportation Needs: Not on file  Physical Activity: Not on file  Stress: Not on file  Social Connections: Not on file  Intimate Partner Violence: Not on file     Review of Systems: General: negative for chills, fever, night sweats or weight  changes.  Cardiovascular: negative for chest pain, dyspnea on exertion, edema, orthopnea, palpitations, paroxysmal nocturnal dyspnea or shortness of breath Dermatological: negative for rash Respiratory: negative for cough or wheezing Urologic: negative for hematuria Abdominal: negative for nausea, vomiting, diarrhea, bright red blood per rectum, melena, or hematemesis Neurologic: negative for visual changes, syncope, or dizziness All other systems reviewed and are otherwise negative except as noted above.    Blood pressure 127/68, pulse 72, height 5' (1.524 m), weight 180 lb 9.6 oz (81.9 kg), last menstrual period 07/16/2014, SpO2 97 %.  General appearance: alert and no distress Neck: no adenopathy, no carotid bruit, no JVD, supple, symmetrical, trachea midline, and thyroid not enlarged, symmetric, no tenderness/mass/nodules Lungs: clear to auscultation bilaterally Heart: regular rate and rhythm, S1, S2 normal, no murmur, click, rub or gallop Extremities: extremities normal, atraumatic, no cyanosis or edema Pulses: 2+ and symmetric Skin: Skin color, texture, turgor normal. No rashes or lesions Neurologic: Grossly normal  EKG sinus rhythm at 72 without ST or T wave changes.  Personally reviewed this EKG.  ASSESSMENT AND PLAN:   Essential hypertension History of essential hypertension a blood pressure measured today 127/68.  She is on amlodipine, metoprolol and spironolactone.  Dyslipidemia History of dyslipidemia on high-dose Crestor with lipid profile performed 11/20/2019 revealing a total cholesterol of 195, LDL 115 and HDL of 62.  Palpitations History of palpitations with a normal monitor in the past showing no arrhythmias.  Her palpitations have significantly improved since discontinuing caffeine intake.  Coronary artery calcification seen on CT scan History of coronary calcification seen on chest CT with a subsequent coronary calcium score performed 06/27/2020 revealing a score  of 384 most of which was in the LAD and some in the left main.  She really denies chest pain.  Based on this however I am going to check a lipid liver profile on her this morning and they refer her to lipid clinic if she has not gone for secondary prevention.     Lorretta Harp MD FACP,FACC,FAHA, [facility] 10/18/2020 8:35 AM

## 2020-10-18 NOTE — Patient Instructions (Signed)
Medication Instructions:  Your physician recommends that you continue on your current medications as directed. Please refer to the Current Medication list given to you today.  *If you need a refill on your cardiac medications before your next appointment, please call your pharmacy*  Lab Work: Your physician recommends that you have labs drawn today: lipid/liver profile  If you have labs (blood work) drawn today and your tests are completely normal, you will receive your results only by: . MyChart Message (if you have MyChart) OR . A paper copy in the mail If you have any lab test that is abnormal or we need to change your treatment, we will call you to review the results.   Follow-Up: At [facility], you and your health needs are our priority.  As part of our continuing mission to provide you with exceptional heart care, we have created designated Provider Care Teams.  These Care Teams include your primary Cardiologist (physician) and Advanced Practice Providers (APPs -  Physician Assistants and Nurse Practitioners) who all work together to provide you with the care you need, when you need it.  We recommend signing up for the patient portal called "MyChart".  Sign up information is provided on this After Visit Summary.  MyChart is used to connect with patients for Virtual Visits (Telemedicine).  Patients are able to view lab/test results, encounter notes, upcoming appointments, etc.  Non-urgent messages can be sent to your provider as well.   To learn more about what you can do with MyChart, go to [email or web address].    Your next appointment:   12 month(s)  The format for your next appointment:   In Person  Provider:   Jonathan Berry, MD 

## 2020-10-18 NOTE — Assessment & Plan Note (Signed)
History of coronary calcification seen on chest CT with a subsequent coronary calcium score performed 06/27/2020 revealing a score of 384 most of which was in the LAD and some in the left main.  She really denies chest pain.  Based on this however I am going to check a lipid liver profile on her this morning and they refer her to lipid clinic if she has not gone for secondary prevention.

## 2020-10-18 NOTE — Assessment & Plan Note (Signed)
History of dyslipidemia on high-dose Crestor with lipid profile performed 11/20/2019 revealing a total cholesterol of 195, LDL 115 and HDL of 62.

## 2020-10-19 ENCOUNTER — Other Ambulatory Visit: Payer: Self-pay | Admitting: Internal Medicine

## 2020-10-19 DIAGNOSIS — E876 Hypokalemia: Secondary | ICD-10-CM

## 2020-10-24 ENCOUNTER — Other Ambulatory Visit: Payer: Self-pay

## 2020-10-24 DIAGNOSIS — E876 Hypokalemia: Secondary | ICD-10-CM

## 2020-10-24 MED ORDER — POTASSIUM CHLORIDE ER 10 MEQ PO TBCR: 10.0000 meq | EXTENDED_RELEASE_TABLET | Freq: Every day | ORAL | 0 refills | Status: DC

## 2020-10-28 ENCOUNTER — Ambulatory Visit (INDEPENDENT_AMBULATORY_CARE_PROVIDER_SITE_OTHER): Payer: Medicare Other | Admitting: Internal Medicine

## 2020-10-28 ENCOUNTER — Encounter: Payer: Self-pay | Admitting: Internal Medicine

## 2020-10-28 ENCOUNTER — Other Ambulatory Visit: Payer: Self-pay

## 2020-10-28 VITALS — BP 130/68 | HR 66 | Temp 98.3°F | Ht 59.0 in | Wt 178.9 lb

## 2020-10-28 DIAGNOSIS — E669 Obesity, unspecified: Secondary | ICD-10-CM

## 2020-10-28 DIAGNOSIS — R21 Rash and other nonspecific skin eruption: Secondary | ICD-10-CM

## 2020-10-28 DIAGNOSIS — Z131 Encounter for screening for diabetes mellitus: Secondary | ICD-10-CM

## 2020-10-28 DIAGNOSIS — E889 Metabolic disorder, unspecified: Secondary | ICD-10-CM | POA: Diagnosis not present

## 2020-10-28 DIAGNOSIS — E8881 Metabolic syndrome: Secondary | ICD-10-CM | POA: Diagnosis not present

## 2020-10-28 DIAGNOSIS — I10 Essential (primary) hypertension: Secondary | ICD-10-CM | POA: Diagnosis not present

## 2020-10-28 DIAGNOSIS — Z0000 Encounter for general adult medical examination without abnormal findings: Secondary | ICD-10-CM | POA: Diagnosis present

## 2020-10-28 DIAGNOSIS — E876 Hypokalemia: Secondary | ICD-10-CM | POA: Diagnosis present

## 2020-10-28 MED ORDER — CLOTRIMAZOLE 1 % EX CREA: 1.0000 "application " | TOPICAL_CREAM | Freq: Two times a day (BID) | CUTANEOUS | 0 refills | Status: AC

## 2020-10-28 NOTE — Patient Instructions (Addendum)
Thank you, Ms.Taydem S Weaver for allowing [place] to provide your care today. Today we discussed:  Rash: Likely your rash is a fungal infection that likes to live in a moist environment.  Please keep the area dry and apply clotrimazole cream twice daily for 2 weeks.  Weight loss: We have referred you to our nutrition specialist here.  We also discussed the possibility of starting a medication.  I have attached some information about the medication semaglutide for your review.  We will check your kidney function and electrolytes today  I have ordered the following labs for you:   Lab Orders         BMP8+Anion Gap      I will call if any are abnormal. All of your labs can be accessed through "My Chart".  My Chart Access: [email or web address]  Please follow-up in 4 weeks.  Please make sure to arrive 15 minutes prior to your next appointment. If you arrive late, you may be asked to reschedule.    We look forward to seeing you next time. Please call our clinic at [phone] if you have any questions or concerns. The best time to call is Monday-Friday from 9am-4pm, but there is someone available 24/7. If after hours or the weekend, call the main hospital number and ask for the Internal Medicine Resident On-Call. If you need medication refills, please notify your pharmacy one week in advance and they will send [place] a request.   Thank you for letting us take part in your care. Wishing you the best!  Wayland Denis, MD 10/28/2020, 9:41 AM IM Resident, PGY-1

## 2020-10-28 NOTE — Assessment & Plan Note (Signed)
Last BMP from April 2022 with normal potassium at 4.6.  Patient is still on spironolactone. We will recheck BMP today.

## 2020-10-28 NOTE — Assessment & Plan Note (Signed)
Patient follows with bariatric medicine. Today she requests to see dietitian through [facility]. We discussed medication options for weight loss including semaglutide injection once weekly.  Patient is interested in learning more about this medication.  We discussed the medication can have some GI side effects that can help with appetite suppression and weight loss.  Plan: -Provided information about semaglutide for weight loss in AVS -Ambulatory referral to diabetes education: Butch Penny Plyler -Attempted to order hemoglobin A1c today, however we are not able to get this covered under patient's insurance.  Patient was informed that this would be a $35 cost.  She does not have the money to pay for the lab at this time though will consider paying for lab at future visit.

## 2020-10-28 NOTE — Progress Notes (Signed)
CC: Rash  HPI:  Lydia Walsh is a 65 y.o. female with a past medical history stated below and presents today for new rash in RLQ skin fold. Please see problem based assessment and plan for additional details.  Past Medical History:  Diagnosis Date   Anemia    Back pain    Endometrial cancer (HCC)    Endometrial polyp    GERD (gastroesophageal reflux disease)    Gout    Heart murmur    Heart palpitations    History of blood transfusion    History of chemotherapy    History of radiation therapy 09/02/2015-10/24/2015   Endometrium 10/15/15- 10/24/15 HDR Treatment, 09/02/15-10/08/15 50 Gy pelvic radiation; Dr. Gery Pray   History of uterine fibroid    Hyperlipidemia    Hypertension    Hypothyroidism    IBS (irritable bowel syndrome)    Joint pain    OSA (obstructive sleep apnea)    moderate OSA per study 06-21-2005--  per pt does need cpap anymore never purchased it   Osteoarthritis    Other fatigue    Other specified disorders of thyroid    PMB (postmenopausal bleeding)    Shortness of breath on exertion    Wears dentures    upper   Wears glasses     Current Outpatient Medications on File Prior to Visit  Medication Sig Dispense Refill   allopurinol (ZYLOPRIM) 100 MG tablet Take 1 tablet by mouth once daily 90 tablet 0   amLODipine (NORVASC) 10 MG tablet Take 1 tablet (10 mg total) by mouth daily. 30 tablet 11   EUTHYROX 112 MCG tablet Take 1 tablet by mouth once daily 30 tablet 5   fluticasone (FLONASE) 50 MCG/ACT nasal spray USE 1-2 SPRAY(S) IN EACH NOSTRIL AS NEEDED FOR TREATMENT OF ALLERGIES 16 g 0   metoprolol succinate (TOPROL-XL) 50 MG 24 hr tablet TAKE 3 TABLETS BY MOUTH ONCE DAILY WITH MEALS OR  IMMEDIATELY  FOLLOWING 270 tablet 1   Multiple Vitamins-Minerals (CENTRUM PO) Take by mouth.     omeprazole (PRILOSEC) 40 MG capsule Take 1 capsule by mouth once daily 30 capsule 0   potassium chloride (KLOR-CON) 10 MEQ tablet Take 1 tablet (10 mEq total) by mouth  daily. 90 tablet 0   rosuvastatin (CRESTOR) 40 MG tablet Take 1 tablet (40 mg total) by mouth daily. 90 tablet 1   spironolactone (ALDACTONE) 25 MG tablet Take 3 tablets by mouth once daily 270 tablet 0   triamcinolone cream (KENALOG) 0.1 % Apply 1 application topically 2 (two) times daily. 30 g 0   [DISCONTINUED] esomeprazole (NEXIUM) 20 MG capsule Take 2 capsules (40 mg total) by mouth daily before breakfast. 30 capsule 3   No current facility-administered medications on file prior to visit.   Review of Systems: ROS negative except for what is noted on the assessment and plan as well as she denies chest pain, shortness of breath, abdominal pain, nausea vomiting.  Vitals:   10/28/20 0843  BP: 130/68  Pulse: 66  Temp: 98.3 F (36.8 C)  TempSrc: Oral  SpO2: 99%  Weight: 178 lb 14.4 oz (81.1 kg)  Height: 4\' 11"  (1.499 m)     Physical Exam: General: Well appearing african [place] female, NAD HENT: normocephalic, atraumatic, MMM EYES: conjunctiva non-erythematous, no scleral icterus CV: regular rate, normal rhythm, no murmurs, rubs, gallops. Pulmonary: Normal work of breathing on RA, lungs clear to auscultation, no rales, wheezes, rhonchi Abdominal: non-distended, soft, non-tender to palpation, normal BS  Skin: Warm and dry, under right lower quadrant skin fold there is some erythema, with small ovoid scale, other skin folds without evidence of rash Neurological: MS: awake, alert and oriented x3, normal speech and fund of knowledge Motor: moves all extremities antigravity Psych: normal affect    Assessment & Plan:   See Encounters Tab for problem based charting.  Patient seen with Dr. Georgana Curio, M.D. [facility], PGY-1 Pager: [phone] Date 10/28/2020 Time 9:03 AM

## 2020-10-28 NOTE — Assessment & Plan Note (Signed)
Patient has history of hypertension on amlodipine, metoprolol, spironolactone.  Today in clinic patient's blood pressure is 130/68.  Patient will continue on current dosing of amlodipine, metoprolol, spironolactone.  We will check a BMP today.

## 2020-10-28 NOTE — Assessment & Plan Note (Signed)
Patient presents to [facility] today with new complaints of intermittently itchy rash under the RLQ skin fold.  She reports 2-week history of intermittent itching in this area and red rash.  She endorses drying the area well after showering, however notes her symptoms are worse when she sweats during the day.  She has never had a similar rash before.  She denies pain in the area.  She has not used any new lotions or soaps in the area.  She rinses the area well after cleaning.  On exam, patient has mild linear erythema under the right lower quadrant abdominal skin fold, appears as though patient has been itching.  There is also small ovoid scale about 2 to 3 cm. On assessment, given the location and moistness of the environment likely the rash is fungal in etiology though with the scale present we also considered discoid eczema.  Plan: -Clotrimazole 1% twice daily for 2 weeks -If rash does not resolve can consider steroid cream -Follow-up 4 weeks

## 2020-10-29 LAB — BMP8+ANION GAP
Anion Gap: 18 mmol/L (ref 10.0–18.0)
BUN/Creatinine Ratio: 20 (ref 12–28)
BUN: 17 mg/dL (ref 8–27)
CO2: 21 mmol/L (ref 20–29)
Calcium: 10.4 mg/dL — ABNORMAL HIGH (ref 8.7–10.3)
Chloride: 101 mmol/L (ref 96–106)
Creatinine, Ser: 0.87 mg/dL (ref 0.57–1.00)
Glucose: 116 mg/dL — ABNORMAL HIGH (ref 70–99)
Potassium: 4.6 mmol/L (ref 3.5–5.2)
Sodium: 140 mmol/L (ref 134–144)
eGFR: 73 mL/min/{1.73_m2} (ref >59)

## 2020-10-30 NOTE — Progress Notes (Signed)
Internal Medicine Clinic Attending ° °I saw and evaluated the patient.  I personally confirmed the key portions of the history and exam documented by Dr. Zinoviev and I reviewed pertinent patient test results.  The assessment, diagnosis, and plan were formulated together and I agree with the documentation in the resident’s note.  °

## 2020-10-30 NOTE — Addendum Note (Signed)
Addended by: Jodean Lima on: 10/30/2020 10:18 AM   Modules accepted: Level of Service

## 2020-11-10 ENCOUNTER — Other Ambulatory Visit: Payer: Self-pay | Admitting: Student

## 2020-11-10 DIAGNOSIS — K219 Gastro-esophageal reflux disease without esophagitis: Secondary | ICD-10-CM

## 2020-11-11 ENCOUNTER — Encounter: Payer: Self-pay | Admitting: Pharmacist

## 2020-11-11 ENCOUNTER — Ambulatory Visit (INDEPENDENT_AMBULATORY_CARE_PROVIDER_SITE_OTHER): Payer: Medicare Other | Admitting: Pharmacist

## 2020-11-11 DIAGNOSIS — Z0000 Encounter for general adult medical examination without abnormal findings: Secondary | ICD-10-CM

## 2020-11-11 NOTE — Progress Notes (Signed)
I discussed the AWV findings with the provider who conducted the visit. I was present in the office suite and immediately available to provide assistance and direction throughout the time the service was provided.   Wayland Denis, MD

## 2020-11-11 NOTE — Progress Notes (Signed)
This AWV is being conducted by [facility] only. The patient was located at home and I was located in [facility]. The patient's identity was confirmed using their DOB and current address. The patient or his/her legal guardian has consented to being evaluated through a telephone encounter and understands the associated risks (an examination cannot be done and the patient may need to come in for an appointment) / benefits (allows the patient to remain at home, decreasing exposure to coronavirus). I personally spent 23 minutes conducting the AWV.  Subjective:   Lydia Walsh is a 65 y.o. female who presents for a Medicare Annual Wellness Visit.  The following items have been reviewed and updated today in the appropriate area in the EMR.   Health Risk Assessment  Height, weight, BMI, and BP Visual acuity if needed Depression screen Fall risk / safety level Advance directive discussion Medical and family history were reviewed and updated Updating list of other providers & suppliers Medication reconciliation, including over the counter medicines Cognitive screen Written screening schedule Risk Factor list Personalized health advice, risky behaviors, and treatment advice  Social History   Social History Narrative   Current Social History 11/11/2020        Patient lives alone in an apartment which is 1 story. There are not steps up to the entrance the patient uses.       Patient's method of transportation is personal car.      The highest level of education was high school diploma.      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      Identified important Relationships are "my kids"       Pets : poodle       Interests / Fun: "I love cooking"      Current Stressors: "none"       Religious / Personal Beliefs: "Holiness"     Cardiac Risk Factors include: obesity (BMI >30kg/m2);hypertension;sedentary lifestyle;advanced age (>70men, >65 women)     Objective:    Vitals: LMP 07/16/2014  Vitals are unable to obtained due to [id number] public health emergency  Activities of Daily Living In your present state of health, do you have any difficulty performing the following activities: 11/11/2020 10/28/2020  Hearing? N N  Vision? N N  Difficulty concentrating or making decisions? N N  Walking or climbing stairs? Y Y  Comment - AT TIMES  Dressing or bathing? N N  Doing errands, shopping? N N  Preparing Food and eating ? N -  Using the Toilet? N -  In the past six months, have you accidently leaked urine? Y -  Do you have problems with loss of bowel control? N -  Managing your Medications? N -  Managing your Finances? N -  Housekeeping or managing your Housekeeping? N -  Some recent data might be hidden    Goals  Goals      Blood Pressure < 140/90     Exercise 7x per week (60 min per time)     "Walk my dog"     Increase physical activity to 30-60 minutes a day     LDL CALC < 130     Reduce calorie intake to 1500  calories per day     Reduce fat intake to 42 grams per day        Fall Risk Fall Risk  11/11/2020 10/28/2020 06/05/2020 05/02/2020 04/12/2020  Falls in the past year? 0 0 0 0 0  Number falls in past yr: 0 - - 0 -  Injury with Fall? 0 - - 0 -  Risk Factor Category  - - - - -  Comment - - - - -  Risk for fall due to : - No Fall Risks - - -  Risk for fall due to: Comment - - - - -  Follow up Falls evaluation completed;Education provided;Falls prevention discussed - - - -    Depression Screen PHQ 2/9 Scores 11/11/2020 10/28/2020 06/05/2020 05/02/2020  PHQ - 2 Score 0 0 0 0  PHQ- 9 Score - 0 - 3     Cognitive Testing Six-Item Cognitive Screener   "I would like to ask you some questions that ask you to use your memory. I am going to name three objects. Please wait until I say all three words, then repeat them. Remember what they are  because I am going to ask you to name them again in a few minutes. Please repeat  these words for me: APPLE--TABLE--PENNY." (Interviewer may repeat names 3 times if necessary but repetition not scored.)  Did patient correctly repeat all three words? Yes - may proceed with screen  What year is this? Incorrect What month is this? Correct What day of the week is this? Incorrect  What were the three objects I asked you to remember? Apple Correct Table Correct Penny Correct  Score one point for each incorrect answer.  A score of 2 or more points warrants additional investigation.  Patient's score 1     Assessment and Plan:    During the course of the visit the patient was educated and counseled about appropriate screening and preventive services as documented in the assessment and plan.  Recommended patient receive the Pneumonia, Shingles, Tetanus, and Flu vaccines. Patient willing to obtain flu vaccine at PCP appointment but stated will consider the others.  Recommended DEXA scan. Patient interested but requested to hold off for the time being.  The printed AVS was given to the patient and included an updated screening schedule, a list of risk factors, and personalized health advice.        Hughes Better, RPH-CPP  11/11/2020

## 2020-11-11 NOTE — Patient Instructions (Addendum)
Things That May Be Affecting Your Health:   Alcohol   Hearing loss x Pain     Depression   Home Safety   Sexual Health    Diabetes   Lack of physical activity   Stress    Difficulty with daily activities   Loneliness   Tiredness    Drug use x Medicines x Tobacco use    Falls   Motor Vehicle Safety   Weight    Food choices   Oral Health   Other      YOUR PERSONALIZED HEALTH PLAN : 1. Schedule your next subsequent Medicare Wellness visit in one year 2. Attend all of your regular appointments to address your medical issues 3. Complete the preventative screenings and services 4. We recommend you obtain your flu, tetanus, Shingles, and pneumonia vaccines.  5. We recommend you obtain a DEXA scan to check your bone health.     Annual Wellness Visit                       Medicare Covered Preventative Screenings and Services   Services & Screenings Men and Women Who How Often Need? Date of Last Service Action  Abdominal Aortic Aneurysm Adults with AAA risk factors Once        Alcohol Misuse and Counseling All Adults Screening once a year if no alcohol misuse. Counseling up to 4 face to face sessions.        Bone Density Measurement  Adults at risk for osteoporosis Once every 2 yrs x      Lipid Panel Z13.6 All adults without CV disease Once every 5 yrs            Colorectal Cancer  Stool sample or Colonoscopy All adults 62 and older   Once every year Every 10 years              Depression All Adults Once a year   Today    Diabetes Screening Blood glucose, post glucose load, or GTT Z13.1 All adults at risk Pre-diabetics Once per year Twice per year          Diabetes  Self-Management Training All adults Diabetics 10 hrs first year; 2 hours subsequent years. Requires Copay        Glaucoma Diabetics Family history of glaucoma African Americans 19 yrs + Hispanic Americans 32 yrs + Annually - requires coppay          Hepatitis C Z72.89 or F19.20 High Risk for HCV Born between  1945 and 1965 Annually Once          HIV Z11.4 All adults based on risk Annually btw ages 49 & 33 regardless of risk Annually > 65 yrs if at increased risk          Lung Cancer Screening Asymptomatic adults aged 61-77 with 30 pack yr history and current smoker OR quit within the last 15 yrs Annually Must have counseling and shared decision making documentation before first screen          Medical Nutrition Therapy Adults with  Diabetes Renal disease Kidney transplant within past 3 yrs 3 hours first year; 2 hours subsequent years        Obesity and Counseling All adults Screening once a year Counseling if BMI 30 or higher   Today    Tobacco Use Counseling Adults who use tobacco  Up to 8 visits in one year        Vaccines Z23  Hepatitis B Influenza  Pneumonia  Adults   Once Once every flu season Two different vaccines separated by one year x      Next Annual Wellness Visit People with Medicare Every year   Today        [place] Women Who How Often Need  Date of Last Service Action  Mammogram  Z12.31 Women over 15 One baseline ages 53-39. Annually ager 40 yrs+          Pap tests All women Annually if high risk. Every 2 yrs for normal risk women          Screening for cervical cancer with  Pap (Z01.419 nl or Z01.411abnl) & HPV Z11.51 Women aged 89 to 75 Once every 5 yrs        Screening pelvic and breast exams All women Annually if high risk. Every 2 yrs for normal risk women        Sexually Transmitted Diseases Chlamydia Gonorrhea Syphilis All at risk adults Annually for non pregnant females at increased risk                [place] Men Who How Ofter Need  Date of Last Service Action  Prostate Cancer - DRE & PSA Men over 50 Annually.  DRE might require a copay.              Sexually Transmitted Diseases Syphilis All at risk adults Annually for men at increased risk          Health Maintenance List     Health Maintenance  Topic Date  Due   DEXA SCAN  Never done   PNA vac Low Risk Adult (1 of 2 - PCV13) Never done   COVID-19 Vaccine (3 - Pfizer risk 4-dose series) 11/21/2019   INFLUENZA VACCINE  08/12/2020   TETANUS/TDAP  10/14/2020   MAMMOGRAM  09/12/2021   COLONOSCOPY (Pts 45-68yrs Insurance coverage will need to be confirmed)  04/30/2026   Hepatitis C Screening  Completed   HPV VACCINES  Aged Out   Fall Prevention in the Home, Adult Falls can cause injuries and can happen to people of all ages. There are many things you can do to make your home safe and to help prevent falls. Ask for help when making these changes. What actions can I take to prevent falls? General Instructions Use good lighting in all rooms. Replace any light bulbs that burn out. Turn on the lights in dark areas. Use night-lights. Keep items that you use often in easy-to-reach places. Lower the shelves around your home if needed. Set up your furniture so you have a clear path. Avoid moving your furniture around. Do not have throw rugs or other things on the floor that can make you trip. Avoid walking on wet floors. If any of your floors are uneven, fix them. Add color or contrast paint or tape to clearly mark and help you see: Grab bars or handrails. First and last steps of staircases. Where the edge of each step is. If you use a stepladder: Make sure that it is fully opened. Do not climb a closed stepladder. Make sure the sides of the stepladder are locked in place. Ask someone to hold the stepladder while you use it. Know where your pets are when moving through your home. What can I do in the bathroom?   Keep the floor dry. Clean up any water on the floor right away. Remove soap buildup in the tub or  shower. Use nonskid mats or decals on the floor of the tub or shower. Attach bath mats securely with double-sided, nonslip rug tape. If you need to sit down in the shower, use a plastic, nonslip stool. Install grab bars by the toilet and in  the tub and shower. Do not use towel bars as grab bars. What can I do in the bedroom? Make sure that you have a light by your bed that is easy to reach. Do not use any sheets or blankets for your bed that hang to the floor. Have a firm chair with side arms that you can use for support when you get dressed. What can I do in the kitchen? Clean up any spills right away. If you need to reach something above you, use a step stool with a grab bar. Keep electrical cords out of the way. Do not use floor polish or wax that makes floors slippery. What can I do with my stairs? Do not leave any items on the stairs. Make sure that you have a light switch at the top and the bottom of the stairs. Make sure that there are handrails on both sides of the stairs. Fix handrails that are broken or loose. Install nonslip stair treads on all your stairs. Avoid having throw rugs at the top or bottom of the stairs. Choose a carpet that does not hide the edge of the steps on the stairs. Check carpeting to make sure that it is firmly attached to the stairs. Fix carpet that is loose or worn. What can I do on the outside of my home? Use bright outdoor lighting. Fix the edges of walkways and driveways and fix any cracks. Remove anything that might make you trip as you walk through a door, such as a raised step or threshold. Trim any bushes or trees on paths to your home. Check to see if handrails are loose or broken and that both sides of all steps have handrails. Install guardrails along the edges of any raised decks and porches. Clear paths of anything that can make you trip, such as tools or rocks. Have leaves, snow, or ice cleared regularly. Use sand or salt on paths during winter. Clean up any spills in your garage right away. This includes grease or oil spills. What other actions can I take? Wear shoes that: Have a low heel. Do not wear high heels. Have rubber bottoms. Feel good on your feet and fit  well. Are closed at the toe. Do not wear open-toe sandals. Use tools that help you move around if needed. These include: Canes. Walkers. Scooters. Crutches. Review your medicines with your doctor. Some medicines can make you feel dizzy. This can increase your chance of falling. Ask your doctor what else you can do to help prevent falls. Where to find more information Centers for Disease Control and Prevention, [employer]: http://www.wolf.info/ National Institute on Aging: http://kim-miller.com/ Contact a doctor if: You are afraid of falling at home. You feel weak, drowsy, or dizzy at home. You fall at home. Summary There are many simple things that you can do to make your home safe and to help prevent falls. Ways to make your home safe include removing things that can make you trip and installing grab bars in the bathroom. Ask for help when making these changes in your home. This information is not intended to replace advice given to you by your health care provider. Make sure you discuss any questions you have with your health care  provider. Document Revised: 08/02/2019 Document Reviewed: 08/02/2019 Elsevier Patient Education  [place] Maintenance, Female Adopting a healthy lifestyle and getting preventive care are important in promoting health and wellness. Ask your health care provider about: The right schedule for you to have regular tests and exams. Things you can do on your own to prevent diseases and keep yourself healthy. What should I know about diet, weight, and exercise? Eat a healthy diet  Eat a diet that includes plenty of vegetables, fruits, low-fat dairy products, and lean protein. Do not eat a lot of foods that are high in solid fats, added sugars, or sodium. Maintain a healthy weight Body mass index (BMI) is used to identify weight problems. It estimates body fat based on height and weight. Your health care provider can help determine your BMI and help you achieve or  maintain a healthy weight. Get regular exercise Get regular exercise. This is one of the most important things you can do for your health. Most adults should: Exercise for at least 150 minutes each week. The exercise should increase your heart rate and make you sweat (moderate-intensity exercise). Do strengthening exercises at least twice a week. This is in addition to the moderate-intensity exercise. Spend less time sitting. Even light physical activity can be beneficial. Watch cholesterol and blood lipids Have your blood tested for lipids and cholesterol at 67 years of age, then have this test every 5 years. Have your cholesterol levels checked more often if: Your lipid or cholesterol levels are high. You are older than 67 years of age. You are at high risk for heart disease. What should I know about cancer screening? Depending on your health history and family history, you may need to have cancer screening at various ages. This may include screening for: Breast cancer. Cervical cancer. Colorectal cancer. Skin cancer. Lung cancer. What should I know about heart disease, diabetes, and high blood pressure? Blood pressure and heart disease High blood pressure causes heart disease and increases the risk of stroke. This is more likely to develop in people who have high blood pressure readings, are of African descent, or are overweight. Have your blood pressure checked: Every 3-5 years if you are 64-19 years of age. Every year if you are 19 years old or older. Diabetes Have regular diabetes screenings. This checks your fasting blood sugar level. Have the screening done: Once every three years after age 87 if you are at a normal weight and have a low risk for diabetes. More often and at a younger age if you are overweight or have a high risk for diabetes. What should I know about preventing infection? Hepatitis B If you have a higher risk for hepatitis B, you should be screened for this  virus. Talk with your health care provider to find out if you are at risk for hepatitis B infection. Hepatitis C Testing is recommended for: Everyone born from 18 through 1965. Anyone with known risk factors for hepatitis C. Sexually transmitted infections (STIs) Get screened for STIs, including gonorrhea and chlamydia, if: You are sexually active and are younger than 67 years of age. You are older than 67 years of age and your health care provider tells you that you are at risk for this type of infection. Your sexual activity has changed since you were last screened, and you are at increased risk for chlamydia or gonorrhea. Ask your health care provider if you are at risk. Ask your health care provider about whether  you are at high risk for HIV. Your health care provider may recommend a prescription medicine to help prevent HIV infection. If you choose to take medicine to prevent HIV, you should first get tested for HIV. You should then be tested every 3 months for as long as you are taking the medicine. Pregnancy If you are about to stop having your period (premenopausal) and you may become pregnant, seek counseling before you get pregnant. Take 400 to 800 micrograms (mcg) of folic acid every day if you become pregnant. Ask for birth control (contraception) if you want to prevent pregnancy. Osteoporosis and menopause Osteoporosis is a disease in which the bones lose minerals and strength with aging. This can result in bone fractures. If you are 23 years old or older, or if you are at risk for osteoporosis and fractures, ask your health care provider if you should: Be screened for bone loss. Take a calcium or vitamin D supplement to lower your risk of fractures. Be given hormone replacement therapy (HRT) to treat symptoms of menopause. Follow these instructions at home: Lifestyle Do not use any products that contain nicotine or tobacco, such as cigarettes, e-cigarettes, and chewing tobacco.  If you need help quitting, ask your health care provider. Do not use street drugs. Do not share needles. Ask your health care provider for help if you need support or information about quitting drugs. Alcohol use Do not drink alcohol if: Your health care provider tells you not to drink. You are pregnant, may be pregnant, or are planning to become pregnant. If you drink alcohol: Limit how much you use to 0-1 drink a day. Limit intake if you are breastfeeding. Be aware of how much alcohol is in your drink. In the [place] one drink equals one 12 oz bottle of beer (355 mL), one 5 oz glass of wine (148 mL), or one 1 oz glass of hard liquor (44 mL). General instructions Schedule regular health, dental, and eye exams. Stay current with your vaccines. Tell your health care provider if: You often feel depressed. You have ever been abused or do not feel safe at home. Summary Adopting a healthy lifestyle and getting preventive care are important in promoting health and wellness. Follow your health care provider's instructions about healthy diet, exercising, and getting tested or screened for diseases. Follow your health care provider's instructions on monitoring your cholesterol and blood pressure. This information is not intended to replace advice given to you by your health care provider. Make sure you discuss any questions you have with your health care provider. Document Revised: 03/08/2020 Document Reviewed: 12/22/2017 Elsevier Patient Education  2022 [employer].

## 2020-11-18 ENCOUNTER — Other Ambulatory Visit: Payer: Self-pay | Admitting: Internal Medicine

## 2020-11-18 DIAGNOSIS — E039 Hypothyroidism, unspecified: Secondary | ICD-10-CM

## 2020-11-20 NOTE — Progress Notes (Signed)
Internal Medicine Clinic Attending  Case discussed with Dr. Vinetta Bergamo.  I reviewed the AWV findings.  I agree with the assessment, diagnosis, and plan of care documented in the AWV note.

## 2020-11-26 ENCOUNTER — Encounter: Payer: Self-pay | Admitting: Oncology

## 2020-11-26 NOTE — Progress Notes (Signed)
Referral faxed successfully to [facility] per patient request.

## 2020-11-26 NOTE — Progress Notes (Signed)
Received notification from [facility] that they do not accept patient's insurance.  Called Lydia Walsh and let her know.  She would like a referral sent to [facility].

## 2020-11-27 ENCOUNTER — Telehealth: Payer: Self-pay | Admitting: Oncology

## 2020-11-27 NOTE — Telephone Encounter (Signed)
Called [facility] with referral to establish care.  Apt scheduled for 02/06/21 with Dr. Kennon Rounds at 8:55.  Left a message for Bethena Roys with appointment details.

## 2020-12-03 ENCOUNTER — Other Ambulatory Visit: Payer: Self-pay | Admitting: Internal Medicine

## 2020-12-03 DIAGNOSIS — Z1231 Encounter for screening mammogram for malignant neoplasm of breast: Secondary | ICD-10-CM

## 2020-12-12 ENCOUNTER — Other Ambulatory Visit: Payer: Self-pay | Admitting: Student

## 2020-12-12 DIAGNOSIS — E039 Hypothyroidism, unspecified: Secondary | ICD-10-CM

## 2020-12-30 ENCOUNTER — Other Ambulatory Visit: Payer: Self-pay | Admitting: Internal Medicine

## 2021-01-08 ENCOUNTER — Other Ambulatory Visit: Payer: Self-pay | Admitting: Internal Medicine

## 2021-01-08 DIAGNOSIS — I10 Essential (primary) hypertension: Secondary | ICD-10-CM

## 2021-01-10 ENCOUNTER — Ambulatory Visit
Admission: RE | Admit: 2021-01-10 | Discharge: 2021-01-10 | Disposition: A | Discharge status: Home / Self Care | Payer: Medicare Other | Source: Ambulatory Visit | Attending: Family Medicine | Admitting: Family Medicine

## 2021-01-10 DIAGNOSIS — Z1231 Encounter for screening mammogram for malignant neoplasm of breast: Secondary | ICD-10-CM

## 2021-01-13 ENCOUNTER — Other Ambulatory Visit: Payer: Self-pay | Admitting: Internal Medicine

## 2021-01-13 DIAGNOSIS — E039 Hypothyroidism, unspecified: Secondary | ICD-10-CM

## 2021-01-29 ENCOUNTER — Other Ambulatory Visit: Payer: Self-pay | Admitting: Student

## 2021-01-29 ENCOUNTER — Other Ambulatory Visit: Payer: Self-pay | Admitting: Cardiovascular Disease

## 2021-01-29 DIAGNOSIS — E876 Hypokalemia: Secondary | ICD-10-CM

## 2021-02-06 ENCOUNTER — Encounter: Payer: Medicare Other | Admitting: Family Medicine

## 2021-02-11 ENCOUNTER — Encounter: Payer: Medicare Other | Admitting: Internal Medicine

## 2021-02-13 ENCOUNTER — Encounter: Payer: Medicare Other | Admitting: Family Medicine

## 2021-02-15 ENCOUNTER — Other Ambulatory Visit: Payer: Self-pay | Admitting: Internal Medicine

## 2021-02-15 DIAGNOSIS — E039 Hypothyroidism, unspecified: Secondary | ICD-10-CM

## 2021-02-19 ENCOUNTER — Ambulatory Visit
Admission: EM | Admit: 2021-02-19 | Discharge: 2021-02-19 | Disposition: A | Discharge status: Home / Self Care | Payer: Medicare Other | Attending: Urgent Care | Admitting: Urgent Care

## 2021-02-19 ENCOUNTER — Other Ambulatory Visit: Payer: Self-pay

## 2021-02-19 ENCOUNTER — Ambulatory Visit (INDEPENDENT_AMBULATORY_CARE_PROVIDER_SITE_OTHER): Payer: Medicare Other

## 2021-02-19 DIAGNOSIS — T148XXA Other injury of unspecified body region, initial encounter: Secondary | ICD-10-CM | POA: Diagnosis not present

## 2021-02-19 DIAGNOSIS — S8001XA Contusion of right knee, initial encounter: Secondary | ICD-10-CM | POA: Diagnosis not present

## 2021-02-19 DIAGNOSIS — M25561 Pain in right knee: Secondary | ICD-10-CM

## 2021-02-19 DIAGNOSIS — M79604 Pain in right leg: Secondary | ICD-10-CM

## 2021-02-19 DIAGNOSIS — S8011XA Contusion of right lower leg, initial encounter: Secondary | ICD-10-CM

## 2021-02-19 DIAGNOSIS — M1711 Unilateral primary osteoarthritis, right knee: Secondary | ICD-10-CM

## 2021-02-19 DIAGNOSIS — W19XXXA Unspecified fall, initial encounter: Secondary | ICD-10-CM | POA: Diagnosis not present

## 2021-02-19 MED ORDER — PREDNISONE 50 MG PO TABS: 50.0000 mg | ORAL_TABLET | Freq: Every day | ORAL | 0 refills | Status: DC

## 2021-02-19 NOTE — ED Triage Notes (Signed)
Pt c/o pt c/o "arthritis real bad in my legs" also c/o fall on Monday with associated bruising to right knee and shin. Also edematous.

## 2021-02-19 NOTE — ED Provider Notes (Signed)
[place]   MRN: [id number] DOB: Nov 10, 1955  Subjective:   Lydia Walsh is a 66 y.o. female presenting for suffered an accidental fall 3 days ago.  Patient states that she accidentally lost her footing when she was outside and fell onto pavement.  She made impact with her lower legs hurting her right leg worse and also suffered a lip laceration.  She states that she had significant swelling of the lip but has since improved.  She also has minimal pain over the area.  She continues to have some pain over the right lower leg but is able to walk.  She does have some swelling.  Also reports a history of significant arthritis.  Denies loss of consciousness, confusion, headache, vision changes, nausea, vomiting, bony deformity.  No history of diabetes.  No current facility-administered medications for this encounter.  Current Outpatient Medications:    allopurinol (ZYLOPRIM) 100 MG tablet, Take 1 tablet by mouth once daily, Disp: 90 tablet, Rfl: 0   amLODipine (NORVASC) 10 MG tablet, Take 1 tablet (10 mg total) by mouth daily., Disp: 30 tablet, Rfl: 11   fluticasone (FLONASE) 50 MCG/ACT nasal spray, USE 1 TO 2 SPRAY(S) IN EACH NOSTRIL AS NEEDED FOR TREATMENT OF ALLERGIES, Disp: 16 g, Rfl: 0   levothyroxine (SYNTHROID) 112 MCG tablet, Take 1 tablet by mouth once daily, Disp: 30 tablet, Rfl: 0   metoprolol succinate (TOPROL-XL) 50 MG 24 hr tablet, TAKE 3 TABLETS BY MOUTH ONCE DAILY WITH MEALS OR  IMMEDIATELY  FOLLOWING, Disp: 270 tablet, Rfl: 1   Multiple Vitamins-Minerals (CENTRUM PO), Take by mouth., Disp: , Rfl:    omeprazole (PRILOSEC) 40 MG capsule, Take 1 capsule (40 mg total) by mouth daily., Disp: 90 capsule, Rfl: 3   potassium chloride (KLOR-CON) 10 MEQ tablet, Take 1 tablet by mouth once daily, Disp: 90 tablet, Rfl: 0   rosuvastatin (CRESTOR) 40 MG tablet, Take 1 tablet by mouth once daily, Disp: 90 tablet, Rfl: 3   spironolactone (ALDACTONE) 25 MG tablet, Take 3 tablets by  mouth once daily, Disp: 270 tablet, Rfl: 0   triamcinolone cream (KENALOG) 0.1 %, Apply 1 application topically 2 (two) times daily. (Patient not taking: Reported on 11/11/2020), Disp: 30 g, Rfl: 0   Allergies  Allergen Reactions   Shrimp (Diagnostic)     Anaphylaxis   Hctz [Hydrochlorothiazide] Rash    Past Medical History:  Diagnosis Date   Anemia    Back pain    Endometrial cancer (HCC)    Endometrial polyp    GERD (gastroesophageal reflux disease)    Gout    Heart murmur    Heart palpitations    History of blood transfusion    History of chemotherapy    History of radiation therapy 09/02/2015-10/24/2015   Endometrium 10/15/15- 10/24/15 HDR Treatment, 09/02/15-10/08/15 50 Gy pelvic radiation; Dr. Gery Pray   History of uterine fibroid    Hyperlipidemia    Hypertension    Hypothyroidism    IBS (irritable bowel syndrome)    Joint pain    OSA (obstructive sleep apnea)    moderate OSA per study 06-21-2005--  per pt does need cpap anymore never purchased it   Osteoarthritis    Other fatigue    Other specified disorders of thyroid    PMB (postmenopausal bleeding)    Shortness of breath on exertion    Wears dentures    upper   Wears glasses      Past Surgical History:  Procedure Laterality  Date   ABDOMINAL HYSTERECTOMY     BREAST EXCISIONAL BIOPSY Right    benign   CHOLECYSTECTOMY     CHOLECYSTECTOMY, LAPAROSCOPIC  2020   COLONOSCOPY     DILATATION & CURETTAGE/HYSTEROSCOPY WITH MYOSURE N/A 03/07/2015   Procedure: DILATATION & CURETTAGE/HYSTEROSCOPY WITH MYOSURE (POLYP);  Surgeon: Arvella Nigh, MD;  Location: [facility];  Service: Gynecology;  Laterality: N/A;   DILATATION & CURRETTAGE/HYSTEROSCOPY WITH RESECTOCOPE  12-13-2002  &  09-26-2004   polyp/  submucosal fibroid   ESOPHAGOGASTRODUODENOSCOPY     EXCISION BENIGN RIGHT BREAST MASS  age 57   IR REMOVAL TUN ACCESS W/ PORT W/O FL MOD SED  07/17/2016   KNEE ARTHROSCOPY W/ MENISCECTOMY Bilateral right  04-12-2006//  left 12-29-2006   and Chondroplasty   port placement     ROBOTIC ASSISTED TOTAL HYSTERECTOMY WITH BILATERAL SALPINGO OOPHERECTOMY Bilateral 05/02/2015   Procedure: XI ROBOTIC ASSISTED TOTAL HYSTERECTOMY WITH BILATERAL SALPINGO OOPHORECTOMY AND SENTINAL LYMPH NODE BIOPSY;  Surgeon: Janie Morning, MD;  Location: [facility];  Service: Gynecology;  Laterality: Bilateral;   TRANSTHORACIC ECHOCARDIOGRAM  04-02-2010   normal LV, ef 65%   TUBAL LIGATION  110's    Family History  Problem Relation Age of Onset   Hypertension Mother    Heart failure Mother    Sudden death Mother    Stroke Mother    Cirrhosis Father        alcoholic   Liver disease Father    Alcoholism Father    Heart disease Son    Hypertension Son    Colon cancer Neg Hx    Stomach cancer Neg Hx    Rectal cancer Neg Hx    Esophageal cancer Neg Hx    Liver cancer Neg Hx     Social History   Tobacco Use   Smoking status: Former    Years: 2.00    Types: Cigarettes    Quit date: 02/28/1994    Years since quitting: 26.9   Smokeless tobacco: Never  Vaping Use   Vaping Use: Never used  Substance Use Topics   Alcohol use: No    Alcohol/week: 0.0 standard drinks   Drug use: No    ROS   Objective:   Vitals: BP 136/80 (BP Location: Left Arm)    Pulse 69    Temp 98.1 F (36.7 C) (Oral)    Resp 18    LMP 07/16/2014    SpO2 98%   Physical Exam Constitutional:      General: She is not in acute distress.    Appearance: Normal appearance. She is well-developed and normal weight. She is not ill-appearing, toxic-appearing or diaphoretic.  HENT:     Head: Normocephalic and atraumatic.     Right Ear: Tympanic membrane, ear canal and external ear normal. No drainage or tenderness. No middle ear effusion. There is no impacted cerumen. Tympanic membrane is not erythematous.     Left Ear: Tympanic membrane, ear canal and external ear normal. No drainage or tenderness.  No middle ear effusion. There is no impacted  cerumen. Tympanic membrane is not erythematous.     Nose: Nose normal. No congestion or rhinorrhea.     Mouth/Throat:     Mouth: Mucous membranes are moist. No oral lesions.     Pharynx: No pharyngeal swelling, oropharyngeal exudate, posterior oropharyngeal erythema or uvula swelling.     Tonsils: No tonsillar exudate or tonsillar abscesses.   Eyes:     General: No scleral icterus.  Right eye: No discharge.        Left eye: No discharge.     Extraocular Movements: Extraocular movements intact.     Right eye: Normal extraocular motion.     Left eye: Normal extraocular motion.     Conjunctiva/sclera: Conjunctivae normal.  Cardiovascular:     Rate and Rhythm: Normal rate.  Pulmonary:     Effort: Pulmonary effort is normal.  Musculoskeletal:     Cervical back: Normal range of motion and neck supple.     Right knee: Swelling and ecchymosis present. No deformity, effusion, erythema, lacerations, bony tenderness or crepitus. Normal range of motion. Tenderness present over the patellar tendon. No medial joint line or lateral joint line tenderness. Normal alignment and normal patellar mobility.       Legs:     Comments: Patient is able to ambulate without assistance at an expected pace.  Lymphadenopathy:     Cervical: No cervical adenopathy.  Skin:    General: Skin is warm and dry.  Neurological:     General: No focal deficit present.     Mental Status: She is alert and oriented to person, place, and time.     Cranial Nerves: No cranial nerve deficit.     Motor: No weakness.     Coordination: Coordination normal.     Gait: Gait normal.     Deep Tendon Reflexes: Reflexes normal.  Psychiatric:        Mood and Affect: Mood normal.        Behavior: Behavior normal.        Thought Content: Thought content normal.    DG Tibia/Fibula Right  Result Date: 02/19/2021 CLINICAL DATA:  Right leg pain and swelling. Arthritis. Fall with bruising of right knee and shin. Edema. EXAM: RIGHT  TIBIA AND FIBULA - 2 VIEW COMPARISON:  Right knee radiographs 11/24/2016 FINDINGS: Mildly decreased bone mineralization. Moderate medial and mild-to-moderate lateral compartment joint space narrowing within the knee. Severe patellofemoral joint space narrowing and peripheral osteophytosis. Mild joint effusion of the knee. Large posterior femoral condyle degenerative osteophytes. Small calcaneal heel spur. No acute fracture or dislocation. IMPRESSION: Severe patellofemoral and moderate medial greater than lateral compartment of the knee osteoarthritis. Electronically Signed   By: Yvonne Kendall M.D.   On: 02/19/2021 08:51     Assessment and Plan :   PDMP not reviewed this encounter.  1. Acute pain of right knee   2. Contusion of right knee and lower leg, initial encounter   3. Hematoma   4. Accidental fall, initial encounter   5. Osteoarthritis of right knee, unspecified osteoarthritis type     Counseled patient on general management of her musculoskeletal pain in the setting of significant arthritis we will use an oral prednisone course.  Otherwise there are no other severe wounds or fractures suspected.  Recommended conservative management. Counseled patient on potential for adverse effects with medications prescribed/recommended today, ER and return-to-clinic precautions discussed, patient verbalized understanding.    Jaynee Eagles, [place] 02/19/21 [phone]

## 2021-02-21 ENCOUNTER — Telehealth: Payer: Self-pay | Admitting: Cardiovascular Disease

## 2021-02-21 NOTE — Telephone Encounter (Signed)
Patient c/o Palpitations:  High priority if patient c/o lightheadedness, shortness of breath, or chest pain  How long have you had palpitations/irregular HR/ Afib? Are you having the symptoms now? Fluttering, not at this exact time, but just a few minutes ago  Are you currently experiencing lightheadedness, SOB or CP? no  Do you have a history of afib (atrial fibrillation) or irregular heart rhythm?   Have you checked your BP or HR? (document readings if available):  did not know  Are you experiencing any other symptoms? No other symptoms- patient wants to be seen- first available with Dr Gwenlyn Found or APP is on 03-14-21- Please call to evaluate

## 2021-02-21 NOTE — Telephone Encounter (Signed)
Patient is returning RN's call.

## 2021-02-21 NOTE — Telephone Encounter (Signed)
Spoke with patient who reports having palpitations. She is taking medications as prescribed. We discussed caffeine and sodium. She avoids caffeine. She drank a 20 oz bottle of Gatorade last night and one today. When I mentioned sodium in the drink, she stated she noticed the palpitations after drinking the Gatorade. BP 139/76, P 82. Recommended that she stop drinking it for a few days and drink water instead and see if the palpitations stop. Pt voiced understanding of this conversation.

## 2021-02-21 NOTE — Telephone Encounter (Signed)
LMTCB

## 2021-02-21 NOTE — Telephone Encounter (Signed)
Spoke with patient again regarding ED visit on the 8th. She was prescribed prednisone 50 mg for 5 days. She stated she felt the palpitations after taking the prednisone. She will continue to the medication as prescribed and let us know if the palpitations stop or continue.

## 2021-03-01 ENCOUNTER — Other Ambulatory Visit: Payer: Self-pay | Admitting: Student

## 2021-03-01 DIAGNOSIS — I10 Essential (primary) hypertension: Secondary | ICD-10-CM

## 2021-03-08 ENCOUNTER — Other Ambulatory Visit: Payer: Self-pay | Admitting: Internal Medicine

## 2021-03-08 DIAGNOSIS — E039 Hypothyroidism, unspecified: Secondary | ICD-10-CM

## 2021-03-10 NOTE — Telephone Encounter (Signed)
Next appt scheduled 03/11/21 with PCP.

## 2021-03-11 ENCOUNTER — Ambulatory Visit (INDEPENDENT_AMBULATORY_CARE_PROVIDER_SITE_OTHER): Payer: Medicare Other | Admitting: Internal Medicine

## 2021-03-11 ENCOUNTER — Other Ambulatory Visit: Payer: Self-pay

## 2021-03-11 ENCOUNTER — Encounter: Payer: Self-pay | Admitting: Internal Medicine

## 2021-03-11 VITALS — BP 143/70 | HR 71 | Temp 98.2°F | Ht 59.0 in | Wt 173.6 lb

## 2021-03-11 DIAGNOSIS — K219 Gastro-esophageal reflux disease without esophagitis: Secondary | ICD-10-CM | POA: Diagnosis not present

## 2021-03-11 DIAGNOSIS — E039 Hypothyroidism, unspecified: Secondary | ICD-10-CM

## 2021-03-11 DIAGNOSIS — Z780 Asymptomatic menopausal state: Secondary | ICD-10-CM | POA: Diagnosis not present

## 2021-03-11 DIAGNOSIS — I10 Essential (primary) hypertension: Secondary | ICD-10-CM | POA: Diagnosis not present

## 2021-03-11 DIAGNOSIS — R109 Unspecified abdominal pain: Secondary | ICD-10-CM | POA: Insufficient documentation

## 2021-03-11 DIAGNOSIS — Z0000 Encounter for general adult medical examination without abnormal findings: Secondary | ICD-10-CM

## 2021-03-11 NOTE — Assessment & Plan Note (Addendum)
BP today 143/70.  Patient reports she has not taken her blood pressure medications yet this morning.  She is currently on metoprolol 50 mg daily, amlodipine 10 mg daily, and spironolactone 25 mg daily.  Also on potassium supplementation 10 mill equivalents daily.  Has medications with her and endorses adherence to regimen.  Last BMP October 2022 unremarkable.  Patient does take her BPs at home and reports systolic numbers 917H at home.  Suspect patient's blood pressure is currently well controlled on her current regimen when she does take her medications in the morning.  Plan: -Continue metoprolol 50 mg daily -Continue amlodipine 10 mg daily -Continue spironolactone 25 mg daily -Follow-up in 2 to 3 months for repeat BMP (also needs hemoglobin A1c and TSH at that time)

## 2021-03-11 NOTE — Patient Instructions (Addendum)
Thank you, Ms.Annlouise S Yaun for allowing [place] to provide your care today. Today we discussed left-sided abdominal pain.  Left-sided abdominal pain: It sounds like you are stomach symptoms are related to gastritis after large fatty meals.  I would avoid eating large greasy meals and continue using your omeprazole as prescribed.  If this abdominal pain worsens and you start having severe pain that radiates to the back please give [place] a call and we can reevaluate.  You are due for a DEXA scan which is a bone scan that looks at bone density and can help [place] see if you are someone who is at risk for fractures of the bones.  I will place an order for DEXA scan today.  They will call you to set up an appointment for imaging.  I have attached some information about healthy eating.  I will postpone your flu and COVID booster until next year.  Please continue to think about the tetanus vaccine and the pneumonia vaccine which can prevent infections.  My Chart Access: [email or web address]  Please follow-up in 2-3 months for a routine health visit and lab work: hemoglobin A1c, BMP, TSH.  Please make sure to arrive 15 minutes prior to your next appointment. If you arrive late, you may be asked to reschedule.    We look forward to seeing you next time. Please call our clinic at [phone] if you have any questions or concerns. The best time to call is Monday-Friday from 9am-4pm, but there is someone available 24/7. If after hours or the weekend, call the main hospital number and ask for the Internal Medicine Resident On-Call. If you need medication refills, please notify your pharmacy one week in advance and they will send [place] a request.   Thank you for letting us take part in your care. Wishing you the best!  Wayland Denis, MD 03/11/2021, 10:08 AM IM Resident, PGY-1

## 2021-03-11 NOTE — Assessment & Plan Note (Signed)
Last TSH April 2022 was normal.  Patient denies symptoms of hypothyroidism and hyperthyroidism today.  Endorses adherence to Synthroid 112 mcg daily.  Plan: -Continue Synthroid 112 mcg daily -Follow-up in 2 to 3 months, obtain TSH at this OV

## 2021-03-11 NOTE — Assessment & Plan Note (Signed)
Patient would like to postpone flu and COVID vaccination until next year.  Patient will consider Tdap and pneumonia vaccine, will rediscuss at next OV.  Order DEXA scan today.

## 2021-03-11 NOTE — Progress Notes (Signed)
CC: Routine health appointment, new left upper quadrant pain  HPI:  Lydia Walsh is a 66 y.o. female with a past medical history stated below and presents today for routine health appointment and new left upper quadrant pain. Please see problem based assessment and plan for additional details.  Past Medical History:  Diagnosis Date   Anemia    Back pain    Endometrial cancer (HCC)    Endometrial polyp    GERD (gastroesophageal reflux disease)    Gout    Heart murmur    Heart palpitations    History of blood transfusion    History of chemotherapy    History of radiation therapy 09/02/2015-10/24/2015   Endometrium 10/15/15- 10/24/15 HDR Treatment, 09/02/15-10/08/15 50 Gy pelvic radiation; Dr. Gery Pray   History of uterine fibroid    Hyperlipidemia    Hypertension    Hypothyroidism    IBS (irritable bowel syndrome)    Joint pain    OSA (obstructive sleep apnea)    moderate OSA per study 06-21-2005--  per pt does need cpap anymore never purchased it   Osteoarthritis    Other fatigue    Other specified disorders of thyroid    PMB (postmenopausal bleeding)    Shortness of breath on exertion    Wears dentures    upper   Wears glasses     Current Outpatient Medications on File Prior to Visit  Medication Sig Dispense Refill   allopurinol (ZYLOPRIM) 100 MG tablet Take 1 tablet by mouth once daily 90 tablet 0   amLODipine (NORVASC) 10 MG tablet Take 1 tablet (10 mg total) by mouth daily. 30 tablet 11   fluticasone (FLONASE) 50 MCG/ACT nasal spray USE 1 TO 2 SPRAY(S) IN EACH NOSTRIL AS NEEDED FOR TREATMENT OF ALLERGIES 16 g 0   levothyroxine (SYNTHROID) 112 MCG tablet Take 1 tablet by mouth once daily 30 tablet 0   metoprolol succinate (TOPROL-XL) 50 MG 24 hr tablet TAKE 3 TABLETS BY MOUTH ONCE DAILY WITH MEALS 270 tablet 0   Multiple Vitamins-Minerals (CENTRUM PO) Take by mouth.     omeprazole (PRILOSEC) 40 MG capsule Take 1 capsule (40 mg total) by mouth daily. 90 capsule  3   potassium chloride (KLOR-CON) 10 MEQ tablet Take 1 tablet by mouth once daily 90 tablet 0   predniSONE (DELTASONE) 50 MG tablet Take 1 tablet (50 mg total) by mouth daily with breakfast. 5 tablet 0   rosuvastatin (CRESTOR) 40 MG tablet Take 1 tablet by mouth once daily 90 tablet 3   spironolactone (ALDACTONE) 25 MG tablet Take 3 tablets by mouth once daily 270 tablet 0   triamcinolone cream (KENALOG) 0.1 % Apply 1 application topically 2 (two) times daily. (Patient not taking: Reported on 11/11/2020) 30 g 0   [DISCONTINUED] esomeprazole (NEXIUM) 20 MG capsule Take 2 capsules (40 mg total) by mouth daily before breakfast. 30 capsule 3   No current facility-administered medications on file prior to visit.    Family History  Problem Relation Age of Onset   Hypertension Mother    Heart failure Mother    Sudden death Mother    Stroke Mother    Cirrhosis Father        alcoholic   Liver disease Father    Alcoholism Father    Heart disease Son    Hypertension Son    Colon cancer Neg Hx    Stomach cancer Neg Hx    Rectal cancer Neg Hx    Esophageal  cancer Neg Hx    Liver cancer Neg Hx     Review of Systems: ROS negative except for what is noted on the assessment and plan.  Vitals:   03/11/21 0922  BP: (!) 143/70  Pulse: 71  Temp: 98.2 F (36.8 C)  TempSrc: Oral  SpO2: 100%  Weight: 173 lb 9.6 oz (78.7 kg)  Height: 4\' 11"  (1.499 m)     Physical Exam: General: Well appearing, obese, African-American female, NAD HENT: External ears and nares appear normal, no rhinorrhea EYES: conjunctiva non-erythematous, no scleral icterus CV: regular rate, normal rhythm, no murmurs, rubs, gallops. Pulmonary: normal work of breathing on RA, lungs clear to auscultation, no rales, wheezes, rhonchi Abdominal: non-distended, soft, mild tenderness to palpation LUQ, normal BS Skin: Warm and dry, no rashes or lesions Neurological: MS: awake, alert and oriented x3, normal speech and fund of  knowledge Motor: moves all extremities antigravity Psych: normal affect    Assessment & Plan:   See Encounters Tab for problem based charting.  Patient discussed with Dr.  Dorothea Ogle, M.D. [facility], PGY-1 Pager: [phone] Date 03/11/2021 Time 9:44 AM

## 2021-03-12 NOTE — Assessment & Plan Note (Signed)
Patient, who is s/p cholecystectomy, reports new complaint of LUQ abdominal pain which has been intermittently present for the last 2 weeks. Mild to moderate burning pain that occurs after eating large greasy meals, about 1-2x/wk. Pain improves after taking omeprazole. Patient denies severe pain that radiates to the back, no early satiety, dysphagia,unintentional weightloss. Patient denies alcohol use. On exam patient has mild tenderness LUQ without splenomegaly.  ? ?On assessment, I suspect patient's burning pain that occurs after eating a large meal and improves with PPI use is 2/2 GERD and or gastritis. We dicussed avoiding large greasy meals and adhering to PPI. Patient requests information on healthy eating which was provided. Counseled patient that if her pain were to worsen, radiate to the back, develop nausea/vomiting, fever, she should call our office or present to UC or ED for further evaluation. ? ?Plan: ?-Continue PPI ?-Avoid large greasy meals ?-Healthy eating pt education provided in AVS ?-Return precautions given ?

## 2021-03-12 NOTE — Progress Notes (Signed)
Internal Medicine Clinic Attending ? ?Case discussed with Dr. Zinoviev  At the time of the visit.  We reviewed the resident?s history and exam and pertinent patient test results.  I agree with the assessment, diagnosis, and plan of care documented in the resident?s note.  ?

## 2021-03-14 ENCOUNTER — Ambulatory Visit: Payer: Medicare Other | Admitting: Cardiovascular Disease

## 2021-03-30 ENCOUNTER — Other Ambulatory Visit: Payer: Self-pay | Admitting: Internal Medicine

## 2021-04-01 ENCOUNTER — Encounter: Payer: Self-pay | Admitting: Emergency Medicine

## 2021-04-01 ENCOUNTER — Other Ambulatory Visit: Payer: Self-pay

## 2021-04-01 ENCOUNTER — Ambulatory Visit
Admission: EM | Admit: 2021-04-01 | Discharge: 2021-04-01 | Disposition: A | Discharge status: Home / Self Care | Payer: Medicare Other | Attending: Internal Medicine | Admitting: Internal Medicine

## 2021-04-01 DIAGNOSIS — R300 Dysuria: Secondary | ICD-10-CM | POA: Insufficient documentation

## 2021-04-01 DIAGNOSIS — R350 Frequency of micturition: Secondary | ICD-10-CM | POA: Insufficient documentation

## 2021-04-01 DIAGNOSIS — R822 Biliuria: Secondary | ICD-10-CM | POA: Diagnosis present

## 2021-04-01 LAB — POCT URINALYSIS DIP (MANUAL ENTRY)
Blood, UA: NEGATIVE
Glucose, UA: NEGATIVE mg/dL
Leukocytes, UA: NEGATIVE
Nitrite, UA: NEGATIVE
Protein Ur, POC: 30 mg/dL — AB
Spec Grav, UA: 1.025 (ref 1.010–1.025)
Urobilinogen, UA: 1 E.U./dL
pH, UA: 6 (ref 5.0–8.0)

## 2021-04-01 NOTE — ED Provider Notes (Signed)
?[place] ? ? ? ?CSN: [id number] ?Arrival date & time: 04/01/21  8144 ? ? ?  ? ?History   ?Chief Complaint ?Chief Complaint  ?Patient presents with  ? Dysuria  ? ? ?HPI ?Lydia Walsh is a 66 y.o. female.  ? ?Patient presents with urinary burning and urinary frequency that has been present for approximately 1 week.  Patient also reports that her urine has been a darker yellow in color.  Denies malodorous urine, hematuria, abdominal pain, fever, back pain.  She does report vaginal discharge that is clear in color but reports that this is normal for her.  Denies any known exposure to STD. ? ? ?Dysuria ? ?Past Medical History:  ?Diagnosis Date  ? Anemia   ? Back pain   ? Endometrial cancer ([place])   ? Endometrial polyp   ? GERD (gastroesophageal reflux disease)   ? Gout   ? Heart murmur   ? Heart palpitations   ? History of blood transfusion   ? History of chemotherapy   ? History of radiation therapy 09/02/2015-10/24/2015  ? Endometrium 10/15/15- 10/24/15 HDR Treatment, 09/02/15-10/08/15 50 Gy pelvic radiation; Dr. Gery Pray  ? History of uterine fibroid   ? Hyperlipidemia   ? Hypertension   ? Hypothyroidism   ? IBS (irritable bowel syndrome)   ? Joint pain   ? OSA (obstructive sleep apnea)   ? moderate OSA per study 06-21-2005--  per pt does need cpap anymore never purchased it  ? Osteoarthritis   ? Other fatigue   ? Other specified disorders of thyroid   ? PMB (postmenopausal bleeding)   ? Shortness of breath on exertion   ? Wears dentures   ? upper  ? Wears glasses   ? ? ?Patient Active Problem List  ? Diagnosis Date Noted  ? Left sided abdominal pain 03/11/2021  ? Morbid obesity ([place]) 05/28/2020  ? Coronary artery calcification seen on CT scan 09/09/2018  ? Primary osteoarthritis of knees, bilateral 11/18/2016  ? AV block, 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  ? Restless leg syndrome 06/01/2016  ? Chronic pain of left knee 02/03/2016  ? Odynophagia 09/03/2015  ? Port catheter in place 08/20/2015  ? Hypokalemia  07/24/2015  ? International Federation 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 ([place]) 07/10/2015  ? 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[place]) 07/10/2015  ? Rash 07/02/2015  ? Renal mass, left 06/21/2015  ? Nephrolithiasis 06/21/2015  ? Obesity (BMI 30-39.9) 06/21/2015  ? Iron deficiency anemia due to chronic blood loss 06/21/2015  ? Endometrial cancer ([place]) 03/29/2015  ? Muscle cramp, nocturnal 07/26/2014  ? Major depression 06/02/2013  ? Fatigue 05/02/2013  ? Healthcare maintenance 08/19/2012  ? Osteoarthritis   ? LOW BACK PAIN, CHRONIC 02/27/2008  ? GOUT 11/24/2007  ? ALLERGIC RHINITIS, SEASONAL 03/09/2006  ? G E R D 02/19/2006  ? Hypothyroidism 02/17/2006  ? Dyslipidemia 02/17/2006  ? Essential hypertension 02/17/2006  ? OBSTRUCTIVE SLEEP APNEA 06/21/2005  ? ? ?Past Surgical History:  ?Procedure Laterality Date  ? ABDOMINAL HYSTERECTOMY    ? BREAST EXCISIONAL BIOPSY Right   ? benign  ? CHOLECYSTECTOMY    ? CHOLECYSTECTOMY, LAPAROSCOPIC  2020  ? COLONOSCOPY    ? DILATATION & CURETTAGE/HYSTEROSCOPY WITH MYOSURE N/A 03/07/2015  ? Procedure: DILATATION & CURETTAGE/HYSTEROSCOPY WITH MYOSURE (POLYP);  Surgeon: Arvella Nigh, MD;  Location: [facility];  Service: Gynecology;  Laterality: N/A;  ? DILATATION & CURRETTAGE/HYSTEROSCOPY WITH RESECTOCOPE  12-13-2002  &  09-26-2004  ? polyp/  submucosal fibroid  ? ESOPHAGOGASTRODUODENOSCOPY    ? EXCISION BENIGN RIGHT BREAST MASS  age 39  ? IR REMOVAL TUN ACCESS W/ PORT W/O FL MOD SED  07/17/2016  ? KNEE ARTHROSCOPY W/ MENISCECTOMY Bilateral right 04-12-2006//  left 12-29-2006  ? and Chondroplasty  ? port placement    ? ROBOTIC ASSISTED TOTAL HYSTERECTOMY WITH BILATERAL SALPINGO OOPHERECTOMY Bilateral 05/02/2015  ? Procedure: XI ROBOTIC ASSISTED TOTAL HYSTERECTOMY WITH BILATERAL SALPINGO OOPHORECTOMY AND SENTINAL LYMPH NODE BIOPSY;  Surgeon: Janie Morning, MD;  Location: [facility];  Service: Gynecology;  Laterality: Bilateral;  ?  TRANSTHORACIC ECHOCARDIOGRAM  04-02-2010  ? normal LV, ef 65%  ? TUBAL LIGATION  1980's  ? ? ?OB History   ? ? Gravida  ?3  ? Para  ?3  ? Term  ?   ? Preterm  ?   ? AB  ?   ? Living  ?   ?  ? ? SAB  ?   ? IAB  ?   ? Ectopic  ?   ? Multiple  ?   ? Live Births  ?   ?   ?  ?  ? ? ? ?Home Medications   ? ?Prior to Admission medications   ?Medication Sig Start Date End Date Taking? Authorizing Provider  ?allopurinol (ZYLOPRIM) 100 MG tablet Take 1 tablet by mouth once daily 12/30/20  Yes Zinoviev, Harmon Pier, MD  ?amLODipine (NORVASC) 10 MG tablet Take 1 tablet (10 mg total) by mouth daily. 05/02/20 05/02/21 Yes Marianna Payment, MD  ?fluticasone (FLONASE) 50 MCG/ACT nasal spray USE 1 TO 2 SPRAY(S) IN EACH NOSTRIL AS NEEDED FOR TREATMENT OF ALLERGIES 11/12/20  Yes Wayland Denis, MD  ?levothyroxine (SYNTHROID) 112 MCG tablet Take 1 tablet by mouth once daily 03/11/21  Yes Wayland Denis, MD  ?metoprolol succinate (TOPROL-XL) 50 MG 24 hr tablet TAKE 3 TABLETS BY MOUTH ONCE DAILY WITH MEALS 03/03/21  Yes Marianna Payment, MD  ?Multiple Vitamins-Minerals (CENTRUM PO) Take by mouth.   Yes [provider]  ?omeprazole (PRILOSEC) 40 MG capsule Take 1 capsule (40 mg total) by mouth daily. 11/12/20  Yes Wayland Denis, MD  ?potassium chloride (KLOR-CON) 10 MEQ tablet Take 1 tablet by mouth once daily 01/29/21  Yes Wayland Denis, MD  ?rosuvastatin (CRESTOR) 40 MG tablet Take 1 tablet by mouth once daily 01/29/21  Yes Lorretta Harp, MD  ?spironolactone (ALDACTONE) 25 MG tablet Take 3 tablets by mouth once daily 01/14/21  Yes Wayland Denis, MD  ?triamcinolone cream (KENALOG) 0.1 % Apply 1 application topically 2 (two) times daily. 06/12/20  Yes Wieters, Hallie C, PA-C  ?esomeprazole (NEXIUM) 20 MG capsule Take 2 capsules (40 mg total) by mouth daily before breakfast. 10/15/10 04/08/11  Rosalia Hammers, MD  ? ? ?Family History ?Family History  ?Problem Relation Age of Onset  ? Hypertension Mother   ? Heart failure Mother   ? Sudden death Mother   ?  Stroke Mother   ? Cirrhosis Father   ?     alcoholic  ? Liver disease Father   ? Alcoholism Father   ? Heart disease Son   ? Hypertension Son   ? Colon cancer Neg Hx   ? Stomach cancer Neg Hx   ? Rectal cancer Neg Hx   ? Esophageal cancer Neg Hx   ? Liver cancer Neg Hx   ? ? ?Social History ?Social History  ? ?Tobacco Use  ? Smoking status: Former  ?  Years: 2.00  ?  Types:  Cigarettes  ?  Quit date: 02/28/1994  ?  Years since quitting: 27.1  ? Smokeless tobacco: Never  ?Vaping Use  ? Vaping Use: Never used  ?Substance Use Topics  ? Alcohol use: No  ?  Alcohol/week: 0.0 standard drinks  ? Drug use: No  ? ? ? ?Allergies   ?Shrimp (diagnostic) and Hctz [hydrochlorothiazide] ? ? ?Review of Systems ?Review of Systems ?Per HPI ? ?Physical Exam ?Triage Vital Signs ?ED Triage Vitals  ?Enc Vitals Group  ?   BP 04/01/21 0837 (!) 147/74  ?   Pulse Rate 04/01/21 0837 65  ?   Resp 04/01/21 0837 18  ?   Temp 04/01/21 0837 (!) 97.5 ?F (36.4 ?C)  ?   Temp Source 04/01/21 0837 Oral  ?   SpO2 04/01/21 0837 97 %  ?   Weight 04/01/21 0838 173 lb 8 oz (78.7 kg)  ?   Height 04/01/21 0838 '4\' 11"'$  (1.499 m)  ?   Head Circumference --   ?   Peak Flow --   ?   Pain Score 04/01/21 0838 7  ?   Pain Loc --   ?   Pain Edu? --   ?   Excl. in [place]? --   ? ?No data found. ? ?Updated Vital Signs ?BP (!) 147/74 (BP Location: Left Arm)   Pulse 65   Temp (!) 97.5 ?F (36.4 ?C) (Oral)   Resp 18   Ht '4\' 11"'$  (1.499 m)   Wt 173 lb 8 oz (78.7 kg)   LMP 07/16/2014   SpO2 97%   BMI 35.04 kg/m?  ? ?Visual Acuity ?Right Eye Distance:   ?Left Eye Distance:   ?Bilateral Distance:   ? ?Right Eye Near:   ?Left Eye Near:    ?Bilateral Near:    ? ?Physical Exam ?Constitutional:   ?   General: She is not in acute distress. ?   Appearance: Normal appearance. She is not toxic-appearing or diaphoretic.  ?HENT:  ?   Head: Normocephalic and atraumatic.  ?Eyes:  ?   Extraocular Movements: Extraocular movements intact.  ?   Conjunctiva/sclera: Conjunctivae normal.   ?Cardiovascular:  ?   Rate and Rhythm: Normal rate and regular rhythm.  ?   Pulses: Normal pulses.  ?   Heart sounds: Normal heart sounds.  ?Pulmonary:  ?   Effort: Pulmonary effort is normal. No respiratory distress.  ?

## 2021-04-01 NOTE — ED Triage Notes (Signed)
Patient c/o dysuria and frequency x 1 week.  Denies any hematuria. ?

## 2021-04-01 NOTE — Discharge Instructions (Signed)
Your urine did not show signs of infection.  Urine culture, blood work, vaginal swab are pending.  We will call if they are positive and treat as appropriate.  Please go to the ER or follow-up if symptoms persist or worsen. ?

## 2021-04-02 ENCOUNTER — Telehealth: Payer: Self-pay | Admitting: Cardiovascular Disease

## 2021-04-02 LAB — COMPREHENSIVE METABOLIC PANEL
ALT: 35 IU/L — ABNORMAL HIGH (ref 0–32)
AST: 35 IU/L (ref 0–40)
Albumin/Globulin Ratio: 1.8 (ref 1.2–2.2)
Albumin: 4.6 g/dL (ref 3.8–4.8)
Alkaline Phosphatase: 114 IU/L (ref 44–121)
BUN/Creatinine Ratio: 22 (ref 12–28)
BUN: 16 mg/dL (ref 8–27)
Bilirubin Total: 0.6 mg/dL (ref 0.0–1.2)
CO2: 22 mmol/L (ref 20–29)
Calcium: 9.9 mg/dL (ref 8.7–10.3)
Chloride: 101 mmol/L (ref 96–106)
Creatinine, Ser: 0.73 mg/dL (ref 0.57–1.00)
Globulin, Total: 2.6 g/dL (ref 1.5–4.5)
Glucose: 110 mg/dL — ABNORMAL HIGH (ref 70–99)
Potassium: 4.1 mmol/L (ref 3.5–5.2)
Sodium: 140 mmol/L (ref 134–144)
Total Protein: 7.2 g/dL (ref 6.0–8.5)
eGFR: 90 mL/min/{1.73_m2} (ref >59)

## 2021-04-02 LAB — CBC
Hematocrit: 39.6 % (ref 34.0–46.6)
Hemoglobin: 12.9 g/dL (ref 11.1–15.9)
MCH: 28.8 pg (ref 26.6–33.0)
MCHC: 32.6 g/dL (ref 31.5–35.7)
MCV: 88 fL (ref 79–97)
Platelets: 229 10*3/uL (ref 150–450)
RBC: 4.48 x10E6/uL (ref 3.77–5.28)
RDW: 14.6 % (ref 11.7–15.4)
WBC: 6.9 10*3/uL (ref 3.4–10.8)

## 2021-04-02 LAB — CERVICOVAGINAL ANCILLARY ONLY
Bacterial Vaginitis (gardnerella): NEGATIVE
Candida Glabrata: NEGATIVE
Candida Vaginitis: NEGATIVE
Comment: NEGATIVE
Comment: NEGATIVE
Comment: NEGATIVE

## 2021-04-02 NOTE — Telephone Encounter (Signed)
Patient c/o Palpitations:  High priority if patient c/o lightheadedness, shortness of breath, or chest pain ? ?How long have you had palpitations/irregular HR/ Afib? Are you having the symptoms now? Since Sunday// currently having symptoms ? ?Are you currently experiencing lightheadedness, SOB or CP? no ? ?Do you have a history of afib (atrial fibrillation) or irregular heart rhythm? yes ? ?Have you checked your BP or HR? (document readings if available): no ? ?Are you experiencing any other symptoms? no ? ?

## 2021-04-02 NOTE — Telephone Encounter (Signed)
Informed patient to continue to monitor when she has palpitations (time of day, how long they last, and if having lightheadedness/dizziness). Recommended that she continue to avoid caffeine and to stay hydrated. She voiced understanding.  ?

## 2021-04-02 NOTE — Telephone Encounter (Signed)
Patient reports having intermittent palpitations and have felt them more since this past Sunday. She denies dizziness, lightheadedness or chest pain. Recommended that if she has any of those symptoms, she needs to go to the ED. She voiced understanding. Any advice on palpitations? ?

## 2021-04-10 ENCOUNTER — Other Ambulatory Visit: Payer: Self-pay

## 2021-04-10 DIAGNOSIS — C541 Malignant neoplasm of endometrium: Secondary | ICD-10-CM

## 2021-04-10 NOTE — Progress Notes (Signed)
Referral sent to GYN ONC per Dr Damita Dunnings.  ?Maylynn Orzechowski,RN  ?

## 2021-04-11 ENCOUNTER — Telehealth: Payer: Self-pay | Admitting: *Deleted

## 2021-04-11 NOTE — Telephone Encounter (Signed)
Scheduled the patient for a follow up appt with Dr Delsa Sale  ?

## 2021-04-15 ENCOUNTER — Encounter: Payer: Medicare Other | Admitting: Obstetrics and Gynecology

## 2021-04-15 ENCOUNTER — Telehealth: Payer: Self-pay

## 2021-04-15 NOTE — Telephone Encounter (Signed)
Called GYN/ONC to review patient referral. Pt to see GYN/ONC 05/07/21 for follow up. Per chart review patient has been previously released from GYN/ONC care. Natale Milch, RN states patient may return for follow up appt since previously scheduled. Pt will still need routine GYN care following that appt. I requested that GYN/ONC provider reach out directly to OB/GYN. Natale Milch, RN will request that provider send direct message to Damita Dunnings, MD who was originally scheduled to see patient for routine exam. Provider to send specific recommendation for care. ?

## 2021-04-16 ENCOUNTER — Other Ambulatory Visit: Payer: Self-pay | Admitting: Internal Medicine

## 2021-04-16 DIAGNOSIS — E039 Hypothyroidism, unspecified: Secondary | ICD-10-CM

## 2021-04-23 ENCOUNTER — Other Ambulatory Visit: Payer: Self-pay | Admitting: Internal Medicine

## 2021-04-23 DIAGNOSIS — I10 Essential (primary) hypertension: Secondary | ICD-10-CM

## 2021-05-03 ENCOUNTER — Other Ambulatory Visit: Payer: Self-pay | Admitting: Internal Medicine

## 2021-05-03 DIAGNOSIS — E876 Hypokalemia: Secondary | ICD-10-CM

## 2021-05-05 ENCOUNTER — Encounter: Payer: Self-pay | Admitting: Obstetrics & Gynecology

## 2021-05-07 ENCOUNTER — Encounter: Payer: Self-pay | Admitting: Obstetrics & Gynecology

## 2021-05-07 ENCOUNTER — Other Ambulatory Visit: Payer: Self-pay

## 2021-05-07 ENCOUNTER — Inpatient Hospital Stay: Payer: Medicare Other | Attending: Obstetrics & Gynecology | Admitting: Obstetrics & Gynecology

## 2021-05-07 VITALS — BP 148/57 | HR 72 | Temp 97.1°F | Resp 16 | Ht 59.0 in | Wt 172.1 lb

## 2021-05-07 DIAGNOSIS — Z8542 Personal history of malignant neoplasm of other parts of uterus: Secondary | ICD-10-CM

## 2021-05-07 DIAGNOSIS — C541 Malignant neoplasm of endometrium: Secondary | ICD-10-CM

## 2021-05-07 NOTE — Progress Notes (Signed)
Follow Up Note: Gyn-Onc ? ?Lydia Walsh 66 y.o. female ? ?CC: She presents for a f/u visit ? ? ?HPI: The oncology history was reviewed. ? ?Interval History: She c/o low energy, insomnia.  She denies any vaginal bleeding, abdominal/pelvic pain, cough or increasing abdominal girth.  ? ?Review of Systems  ?Review of Systems  ?Constitutional:  Negative for malaise/fatigue and weight loss.  ?Respiratory:  Negative for shortness of breath and wheezing.   ?Cardiovascular:  Negative for chest pain and leg swelling.  ?Gastrointestinal:  Negative for abdominal pain, blood in stool, constipation, nausea and vomiting.  ?Genitourinary:  Negative for dysuria, frequency, hematuria and urgency.  ?Musculoskeletal:  Negative for joint pain and myalgias.  ?Neurological:  Negative for weakness.  ?Psychiatric/Behavioral:  Negative for depression. The patient does not have insomnia.   ? ?Current medications, allergy, social history, past surgical history, past medical history, family history were all reviewed. ? ? ? ?Vitals:  BP (!) 148/57 (BP Location: Right Arm, Patient Position: Sitting)   Pulse 72   Temp (!) 97.1 ?F (36.2 ?C) (Tympanic)   Resp 16   Ht '4\' 11"'$  (1.499 m)   Wt 172 lb 1.6 oz (78.1 kg)   LMP 07/16/2014   SpO2 100%   BMI 34.76 kg/m?   ? ? ?Physical Exam:  ?Physical Exam ?Exam conducted with a chaperone present.  ?Constitutional:   ?   General: She is not in acute distress. ?Cardiovascular:  ?   Rate and Rhythm: Normal rate and regular rhythm.  ?Pulmonary:  ?   Effort: Pulmonary effort is normal.  ?   Breath sounds: Normal breath sounds. No wheezing or rhonchi.  ?Abdominal:  ?   Palpations: Abdomen is soft.  ?   Tenderness: There is no abdominal tenderness. There is no right CVA tenderness or left CVA tenderness.  ?   Hernia: No hernia is present.  ?Genitourinary: ?   General: Normal vulva.  ?   Urethra: No urethral lesion.  ?   Vagina: No lesions. No bleeding. Foreshortened ?Musculoskeletal:  ?   Cervical back:  Neck supple.  ?   Right lower leg: No edema.  ?   Left lower leg: No edema.  ?Lymphadenopathy:  ?   Upper Body:  ?   Right upper body: No supraclavicular adenopathy.  ?   Left upper body: No supraclavicular adenopathy.  ?   Lower Body: No right inguinal adenopathy. No left inguinal adenopathy.  ?Skin: ?   Findings: No rash.  ?Neurological:  ?   Mental Status: She is oriented to person, place, and time.  ? ?Assessment/Plan:  ?Endometrial cancer ([place]) ?Lydia Walsh with h/o Stage III C, FIGO Gr2 EC ?Negative symptom review, normal exam.  No evidence of recurrence ? ?>Return prn or in 6 mos  ? ? ?Lydia Crocker, MD  ?

## 2021-05-07 NOTE — Patient Instructions (Signed)
Return in 6 months

## 2021-05-07 NOTE — Assessment & Plan Note (Addendum)
66 yo with h/o Stage III C, FIGO Gr2 EC ?Negative symptom review, normal exam.  No evidence of recurrence ? ?>Return prn or in 6 mos ?

## 2021-05-08 ENCOUNTER — Other Ambulatory Visit: Payer: Self-pay | Admitting: Internal Medicine

## 2021-05-08 DIAGNOSIS — I10 Essential (primary) hypertension: Secondary | ICD-10-CM

## 2021-05-09 ENCOUNTER — Ambulatory Visit (INDEPENDENT_AMBULATORY_CARE_PROVIDER_SITE_OTHER): Payer: Medicare Other | Admitting: Internal Medicine

## 2021-05-09 ENCOUNTER — Encounter: Payer: Self-pay | Admitting: Internal Medicine

## 2021-05-09 VITALS — BP 140/58 | HR 70 | Temp 98.2°F | Resp 24 | Ht 59.0 in | Wt 171.7 lb

## 2021-05-09 DIAGNOSIS — K219 Gastro-esophageal reflux disease without esophagitis: Secondary | ICD-10-CM

## 2021-05-09 DIAGNOSIS — E039 Hypothyroidism, unspecified: Secondary | ICD-10-CM | POA: Diagnosis present

## 2021-05-09 DIAGNOSIS — R634 Abnormal weight loss: Secondary | ICD-10-CM | POA: Diagnosis not present

## 2021-05-09 DIAGNOSIS — M542 Cervicalgia: Secondary | ICD-10-CM

## 2021-05-09 NOTE — Progress Notes (Signed)
? ? ?Subjective:  ?CC: neck pain ? ?Lydia Walsh is a 66 y.o. female with a past medical history stated below and presents today for neck pain. Please see problem based assessment and plan for additional details. ? ?Past Medical History:  ?Diagnosis Date  ? Anemia   ? Back pain   ? Endometrial cancer ([place])   ? Endometrial polyp   ? GERD (gastroesophageal reflux disease)   ? Gout   ? Heart murmur   ? Heart palpitations   ? History of blood transfusion   ? History of chemotherapy   ? History of radiation therapy 09/02/2015-10/24/2015  ? Endometrium 10/15/15- 10/24/15 HDR Treatment, 09/02/15-10/08/15 50 Gy pelvic radiation; Dr. Gery Pray  ? History of uterine fibroid   ? Hyperlipidemia   ? Hypertension   ? Hypothyroidism   ? IBS (irritable bowel syndrome)   ? Joint pain   ? OSA (obstructive sleep apnea)   ? moderate OSA per study 06-21-2005--  per pt does need cpap anymore never purchased it  ? Osteoarthritis   ? Other fatigue   ? Other specified disorders of thyroid   ? PMB (postmenopausal bleeding)   ? Shortness of breath on exertion   ? Wears dentures   ? upper  ? Wears glasses   ? ? ?Current Outpatient Medications on File Prior to Visit  ?Medication Sig Dispense Refill  ? allopurinol (ZYLOPRIM) 100 MG tablet Take 1 tablet by mouth once daily 90 tablet 0  ? amLODipine (NORVASC) 10 MG tablet Take 1 tablet (10 mg total) by mouth daily. 30 tablet 11  ? fluticasone (FLONASE) 50 MCG/ACT nasal spray USE 1 TO 2 SPRAY(S) IN EACH NOSTRIL AS NEEDED FOR TREATMENT OF ALLERGIES 16 g 0  ? levothyroxine (SYNTHROID) 112 MCG tablet Take 1 tablet (112 mcg total) by mouth daily. 30 tablet 5  ? metoprolol succinate (TOPROL-XL) 50 MG 24 hr tablet TAKE 3 TABLETS BY MOUTH ONCE DAILY WITH MEALS 270 tablet 0  ? Multiple Vitamins-Minerals (CENTRUM PO) Take by mouth.    ? omeprazole (PRILOSEC) 40 MG capsule Take 1 capsule (40 mg total) by mouth daily. 90 capsule 3  ? potassium chloride (KLOR-CON) 10 MEQ tablet Take 1 tablet by mouth  once daily 90 tablet 0  ? rosuvastatin (CRESTOR) 40 MG tablet Take 1 tablet by mouth once daily 90 tablet 3  ? spironolactone (ALDACTONE) 25 MG tablet Take 3 tablets by mouth once daily 270 tablet 0  ? [DISCONTINUED] esomeprazole (NEXIUM) 20 MG capsule Take 2 capsules (40 mg total) by mouth daily before breakfast. 30 capsule 3  ? ?No current facility-administered medications on file prior to visit.  ? ? ?Family History  ?Problem Relation Age of Onset  ? Hypertension Mother   ? Heart failure Mother   ? Sudden death Mother   ? Stroke Mother   ? Cirrhosis Father   ?     alcoholic  ? Liver disease Father   ? Alcoholism Father   ? Heart disease Son   ? Hypertension Son   ? Colon cancer Neg Hx   ? Stomach cancer Neg Hx   ? Rectal cancer Neg Hx   ? Esophageal cancer Neg Hx   ? Liver cancer Neg Hx   ? ? ?Social History  ? ?Socioeconomic History  ? Marital status: Widowed  ?  Spouse name: Not on file  ? Number of children: 3  ? Years of education: Not on file  ? Highest education level: 12th grade  ?  Occupational History  ? Occupation: Production manager, [employer]  ?Tobacco Use  ? Smoking status: Former  ?  Years: 2.00  ?  Types: Cigarettes  ?  Quit date: 02/28/1994  ?  Years since quitting: 27.2  ? Smokeless tobacco: Never  ?Vaping Use  ? Vaping Use: Never used  ?Substance and Sexual Activity  ? Alcohol use: No  ?  Alcohol/week: 0.0 standard drinks  ? Drug use: No  ? Sexual activity: Not Currently  ?  Birth control/protection: Post-menopausal  ?Other Topics Concern  ? Not on file  ?Social History Narrative  ? Current Social History 11/11/2020    ?   ? Patient lives alone in an apartment which is 1 story. There are not steps up to the entrance the patient uses.   ?   ? Patient's method of transportation is personal car.  ?   ? The highest level of education was high school diploma.  ?   ? 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  ?   ? Identified important Relationships are "my kids"   ?   ?  Pets : poodle  ?    ? Interests / Fun: "I love cooking"  ?   ? Current Stressors: "none"   ?   ? Religious / Personal Beliefs: "Holiness"   ? ?Social Determinants of Health  ? ?Financial Resource Strain: Not on file  ?Food Insecurity: Not on file  ?Transportation Needs: Not on file  ?Physical Activity: Not on file  ?Stress: Not on file  ?Social Connections: Not on file  ?Intimate Partner Violence: Not on file  ? ? ?Review of Systems: ?ROS negative except for what is noted on the assessment and plan. ? ?Objective:  ? ?Vitals:  ? 05/09/21 0914  ?BP: (!) 140/58  ?Pulse: 70  ?Resp: (!) 24  ?Temp: 98.2 ?F (36.8 ?C)  ?TempSrc: Oral  ?SpO2: 100%  ?Weight: 171 lb 11.2 oz (77.9 kg)  ?Height: '4\' 11"'$  (1.499 m)  ? ? ?Physical Exam: ?Gen: A&O x3 and in no apparent distress, well appearing and nourished. ?HEENT:  ?  Head - normocephalic, atraumatic.  ?  Mouth - No obvious caries or periodontal disease. ?Neck: no masses or nodules, AROM intact. ?CV: RRR, no murmurs, S1/S2 presents  ?Resp: Clear to ascultation bilaterally  ?Abd: BS (+) x4, soft, non-tender abdomen, without hepatosplenomegaly or masses ?MSK: Grossly normal AROM and strength x4 extremities. ?Skin: good skin turgor, no rashes, unusual bruising, or prominent lesions.  ?Psych: Oriented x3 and responding appropriately. Intact memory, normal mood, judgement, affect, and insight.  ? ? ?Assessment & Plan:  ?See Encounters Tab for problem based charting. ? ?No problem-specific Assessment & Plan notes found for this encounter. ? ? ?Patient discussed with Dr.  Saverio Danker ? ? ?Marianna Payment, D.O. ?[facility]  PGY-3 ?Pager: [phone]  Phone: [phone] ?Date 05/09/2021  Time 9:21 AM ? ?

## 2021-05-09 NOTE — Patient Instructions (Signed)
Thank you, [name] for allowing [place] to provide your care today. Today we discussed throat pain and sleeping problems.   ? ?Labs/Tests Ordered: ?Lab Orders  ?No laboratory test(s) ordered today  ?  ? ?Referrals Ordered:  ?Referral Orders  ?No referral(s) requested today  ?  ? ?Medication Changes:  ?There are no discontinued medications.  ? ?No orders of the defined types were placed in this encounter. ?  ? ?Health Maintenance Screening: ?There are no preventive care reminders to display for this patient.  ? ?Instructions:  ?- Need to follow up with GI  ?- Make changes to sleep habits ?- Sleep with head elevated and decreased acidic food.  ? ?Follow up:  1 month   ? ?Remember: If you have any questions or concerns, call our clinic at [phone] or after hours call [phone] and ask for the internal medicine resident on call. ? ?Marianna Payment, D.O. ?[place] ? ? ? ?

## 2021-05-10 LAB — TSH: TSH: 0.935 u[IU]/mL (ref 0.450–4.500)

## 2021-05-11 ENCOUNTER — Encounter: Payer: Self-pay | Admitting: Internal Medicine

## 2021-05-11 DIAGNOSIS — R634 Abnormal weight loss: Secondary | ICD-10-CM | POA: Insufficient documentation

## 2021-05-11 NOTE — Assessment & Plan Note (Signed)
Patient present presents for further evaluation management of her gastroesophageal reflux.  She states that she continues to take Prilosec 40 mg daily with residual symptoms.  She endorses epigastric pain that worsens with food and laying down at night.  She previously had an EGD several years ago that was did not show any issues.  She has however had persistent symptoms with a 20 pound weight loss in the past year.  She will likely need further evaluation with a diagnostic upper endoscopy and possible colonoscopy in the near future. ? ?Assessment/plan: ?Resistant GERD with 20 pound weight loss ?-Given red flag signs will need EGD in the near future ?-We will need to consider diagnostic colonoscopy as well. ? ?

## 2021-05-11 NOTE — Assessment & Plan Note (Addendum)
Patient presents for evaluation of neck pain. She states that she is having pain on the left side of her anterior neck.  She states that the pain has been there for several weeks usually occurs around 3 PM.  She takes ibuprofen daily to help with her pain.  She denies any associated symptoms such as dysphagia, odynophagia, rhinorrhea, postnasal drip, cough, shortness of breath, neck weakness, focal mass. She denies any local trauma or repetitive motions that could irritate her neck.  ? ?On exam, patient has clear nasal and oropharynx.  She has full range of motion of her neck without pain.  There is no regional lymphadenopathy in her cervical supraclavicular or axillary lymph nodes.  She does have tenderness to palpation of the left side of her neck at the anterior border of the sternocleidomastoid muscle.  No obvious mass or lumps.  There is no overlying erythema or skin changes. ? ?Plan: ?Muscle strain: ?-Discussed activity modifications ?-Counseled on neck stretches and exercises ?-If patient's symptoms do not improve she may need an ultrasound to ensure no underlying masses. ?

## 2021-05-11 NOTE — Assessment & Plan Note (Signed)
Patient presents to clinic with history of endometrial cancer.  She recently had a follow-up appointment with her GY/Onc physician who stated no evidence of recurrence.  However, patient comes in with a 20 pound weight loss over the last year.  She denies any signs or symptoms concerning for systemic inflammation such as fevers or chills.  She denies any night sweats.  She is however having persistent GERD which may be contributing to her symptoms.  However, given her history of endometrial cancer, she will likely need closer evaluation.  I will reach out to her GYN/Onc addition to make her aware of the weight change as well. ?

## 2021-05-11 NOTE — Assessment & Plan Note (Signed)
TSH performed today and is within normal limits. ?

## 2021-05-12 NOTE — Progress Notes (Signed)
Internal Medicine Clinic Attending  Case discussed with Dr. Coe  At the time of the visit.  We reviewed the resident's history and exam and pertinent patient test results.  I agree with the assessment, diagnosis, and plan of care documented in the resident's note.  

## 2021-05-27 ENCOUNTER — Ambulatory Visit (INDEPENDENT_AMBULATORY_CARE_PROVIDER_SITE_OTHER): Payer: Medicare Other | Admitting: Cardiovascular Disease

## 2021-05-27 ENCOUNTER — Other Ambulatory Visit: Payer: Self-pay | Admitting: Internal Medicine

## 2021-05-27 ENCOUNTER — Encounter: Payer: Self-pay | Admitting: Cardiovascular Disease

## 2021-05-27 DIAGNOSIS — I10 Essential (primary) hypertension: Secondary | ICD-10-CM | POA: Diagnosis not present

## 2021-05-27 DIAGNOSIS — R931 Abnormal findings on diagnostic imaging of heart and coronary circulation: Secondary | ICD-10-CM | POA: Diagnosis not present

## 2021-05-27 DIAGNOSIS — E785 Hyperlipidemia, unspecified: Secondary | ICD-10-CM | POA: Diagnosis not present

## 2021-05-27 LAB — LIPID PANEL
Chol/HDL Ratio: 3.1 ratio (ref 0.0–4.4)
Cholesterol, Total: 175 mg/dL (ref 100–199)
HDL: 56 mg/dL (ref >39)
LDL Chol Calc (NIH): 98 mg/dL (ref 0–99)
Triglycerides: 118 mg/dL (ref 0–149)
VLDL Cholesterol Cal: 21 mg/dL (ref 5–40)

## 2021-05-27 LAB — HEPATIC FUNCTION PANEL
ALT: 33 IU/L — ABNORMAL HIGH (ref 0–32)
AST: 29 IU/L (ref 0–40)
Albumin: 4.6 g/dL (ref 3.8–4.8)
Alkaline Phosphatase: 114 IU/L (ref 44–121)
Bilirubin Total: 0.6 mg/dL (ref 0.0–1.2)
Bilirubin, Direct: 0.15 mg/dL (ref 0.00–0.40)
Total Protein: 7.4 g/dL (ref 6.0–8.5)

## 2021-05-27 NOTE — Assessment & Plan Note (Signed)
History of essential hypertension a blood pressure measured today at 126/60.  She is on amlodipine, metoprolol and Aldactone. ?

## 2021-05-27 NOTE — Assessment & Plan Note (Signed)
Coronary calcium score of 384 measured 06/27/2020 with calcium principally in the LAD territory.  She is completely asymptomatic.  Based on this, we have targeting an LDL of less than 70 for secondary prevention. ?

## 2021-05-27 NOTE — Patient Instructions (Signed)
Medication Instructions:  ?Your physician recommends that you continue on your current medications as directed. Please refer to the Current Medication list given to you today. ? ?*If you need a refill on your cardiac medications before your next appointment, please call your pharmacy* ? ? ?Lab Work: ?Your physician recommends that you have labs drawn today: Lipid & Liver profile ? ?If you have labs (blood work) drawn today and your tests are completely normal, you will receive your results only by: ?MyChart Message (if you have MyChart) OR ?A paper copy in the mail ?If you have any lab test that is abnormal or we need to change your treatment, we will call you to review the results. ? ? ?Follow-Up: ?At [facility], you and your health needs are our priority.  As part of our continuing mission to provide you with exceptional heart care, we have created designated Provider Care Teams.  These Care Teams include your primary Cardiologist (physician) and Advanced Practice Providers (APPs -  Physician Assistants and Nurse Practitioners) who all work together to provide you with the care you need, when you need it. ? ?We recommend signing up for the patient portal called "MyChart".  Sign up information is provided on this After Visit Summary.  MyChart is used to connect with patients for Virtual Visits (Telemedicine).  Patients are able to view lab/test results, encounter notes, upcoming appointments, etc.  Non-urgent messages can be sent to your provider as well.   ?To learn more about what you can do with MyChart, go to [email or web address].   ? ?Your next appointment:   ?12 month(s) ? ?The format for your next appointment:   ?In Person ? ?Provider:   ?Quay Burow, MD ?

## 2021-05-27 NOTE — Progress Notes (Signed)
? ? ? ?05/27/2021 ?Lydia Walsh   ?07-31-1955  ?[id number] ? ?Primary Physician Wayland Denis, MD ?Primary Cardiologist: Lorretta Harp MD Lydia Walsh, [place] ? ?HPI:  Lydia Walsh is a 66 y.o.  moderately overweight divorced African American female mother of 3 children and is retired from [place].  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  She was referred by her PCP for evaluation of palpitations.  I last saw her in the office 10/18/2020.  She did see Rosaria Ferries, PA-C in the office 6 months later.  She's been evaluated for this in the past several years ago. She also has had a diagnosis of endometrial cancer in June 2017 she has no other cardiac risk factors. She does not drink caffeine. I ordered a 2-D echocardiogram which was entirely normal and the monitor that showed only sinus rhythm.  She did have a Port-A-Cath indwelling which was removed in 2018. ?  ?She  did have a CT scan performed 03/17/2018 that had incidental finding of coronary calcification .    She was complaining of atypical chest pain when I saw her a year ago but this is no longer is a symptom. ? ? ?She did have a coronary calcium score performed 06/27/2020 which was 384 most of which was in the LAD and left main.  I did increase her rosuvastatin to 40 mg a day.  Her LDL was 115 performed 11/20/2019 which had decreased to 85 on 10/18/2020. ? ?Since I saw her 8 months ago she continues to do well.  Her palpitations have been less noticeable, principally at night when she lies down.  She no longer has chest pain. ? ? ?Current Meds  ?Medication Sig  ? allopurinol (ZYLOPRIM) 100 MG tablet Take 1 tablet by mouth once daily  ? amLODipine (NORVASC) 10 MG tablet Take 1 tablet by mouth once daily  ? fluticasone (FLONASE) 50 MCG/ACT nasal spray USE 1 TO 2 SPRAY(S) IN EACH NOSTRIL AS NEEDED FOR TREATMENT OF ALLERGIES  ? levothyroxine (SYNTHROID) 112 MCG tablet Take 1 tablet (112 mcg total) by mouth daily.  ?  metoprolol succinate (TOPROL-XL) 50 MG 24 hr tablet TAKE 3 TABLETS BY MOUTH ONCE DAILY WITH MEALS  ? Multiple Vitamins-Minerals (CENTRUM PO) Take by mouth.  ? omeprazole (PRILOSEC) 40 MG capsule Take 1 capsule (40 mg total) by mouth daily.  ? potassium chloride (KLOR-CON) 10 MEQ tablet Take 1 tablet by mouth once daily  ? rosuvastatin (CRESTOR) 40 MG tablet Take 1 tablet by mouth once daily  ? spironolactone (ALDACTONE) 25 MG tablet Take 3 tablets by mouth once daily  ?  ? ?Allergies  ?Allergen Reactions  ? Shrimp (Diagnostic)   ?  Anaphylaxis  ? Hctz [Hydrochlorothiazide] Rash  ? ? ?Social History  ? ?Socioeconomic History  ? Marital status: Widowed  ?  Spouse name: Not on file  ? Number of children: 3  ? Years of education: Not on file  ? Highest education level: 12th grade  ?Occupational History  ? Occupation: Production manager, [employer]  ?Tobacco Use  ? Smoking status: Former  ?  Years: 2.00  ?  Types: Cigarettes  ?  Quit date: 02/28/1994  ?  Years since quitting: 27.2  ? Smokeless tobacco: Never  ?Vaping Use  ? Vaping Use: Never used  ?Substance and Sexual Activity  ? Alcohol use: No  ?  Alcohol/week: 0.0 standard drinks  ? Drug use: No  ? Sexual activity: Not  Currently  ?  Birth control/protection: Post-menopausal  ?Other Topics Concern  ? Not on file  ?Social History Narrative  ? Current Social History 11/11/2020    ?   ? Patient lives alone in an apartment which is 1 story. There are not steps up to the entrance the patient uses.   ?   ? Patient's method of transportation is personal car.  ?   ? The highest level of education was high school diploma.  ?   ? 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  ?   ? Identified important Relationships are "my kids"   ?   ? Pets : poodle  ?    ? Interests / Fun: "I love cooking"  ?   ? Current Stressors: "none"   ?   ? Religious / Personal Beliefs: "Holiness"   ? ?Social Determinants of Health  ? ?Financial Resource Strain: Not on file   ?Food Insecurity: Not on file  ?Transportation Needs: Not on file  ?Physical Activity: Not on file  ?Stress: Not on file  ?Social Connections: Not on file  ?Intimate Partner Violence: Not on file  ?  ? ?Review of Systems: ?General: negative for chills, fever, night sweats or weight changes.  ?Cardiovascular: negative for chest pain, dyspnea on exertion, edema, orthopnea, palpitations, paroxysmal nocturnal dyspnea or shortness of breath ?Dermatological: negative for rash ?Respiratory: negative for cough or wheezing ?Urologic: negative for hematuria ?Abdominal: negative for nausea, vomiting, diarrhea, bright red blood per rectum, melena, or hematemesis ?Neurologic: negative for visual changes, syncope, or dizziness ?All other systems reviewed and are otherwise negative except as noted above. ? ? ? ?Blood pressure 126/60, pulse 71, height 5' (1.524 m), weight 170 lb (77.1 kg), last menstrual period 07/16/2014.  ?General appearance: alert and no distress ?Neck: no adenopathy, no carotid bruit, no JVD, supple, symmetrical, trachea midline, and thyroid not enlarged, symmetric, no tenderness/mass/nodules ?Lungs: clear to auscultation bilaterally ?Heart: regular rate and rhythm, S1, S2 normal, no murmur, click, rub or gallop ? ?EKG sinus rhythm at 71 without ST or T wave changes.  Personally reviewed this EKG. ? ?ASSESSMENT AND PLAN:  ? ?Essential hypertension ?History of essential hypertension a blood pressure measured today at 126/60.  She is on amlodipine, metoprolol and Aldactone. ? ?Dyslipidemia ?History of dyslipidemia on high-dose rosuvastatin with lipid profile performed 10/18/2020 revealing total cholesterol 157, LDL of 85 and HDL of 55.  Her coronary calcium score was elevated at 384.  LDL target should be less than 70.  We will recheck a lipid liver profile this morning. ? ?Elevated coronary artery calcium score ?Coronary calcium score of 384 measured 06/27/2020 with calcium principally in the LAD territory.  She  is completely asymptomatic.  Based on this, we have targeting an LDL of less than 70 for secondary prevention. ? ? ? ? ?Lorretta Harp MD FACP,FACC,FAHA, FSCAI ?05/27/2021 ?8:31 AM ?

## 2021-05-27 NOTE — Assessment & Plan Note (Signed)
History of dyslipidemia on high-dose rosuvastatin with lipid profile performed 10/18/2020 revealing total cholesterol 157, LDL of 85 and HDL of 55.  Her coronary calcium score was elevated at 384.  LDL target should be less than 70.  We will recheck a lipid liver profile this morning. ?

## 2021-06-03 ENCOUNTER — Telehealth: Payer: Self-pay | Admitting: Cardiovascular Disease

## 2021-06-03 ENCOUNTER — Other Ambulatory Visit: Payer: Self-pay

## 2021-06-03 DIAGNOSIS — E785 Hyperlipidemia, unspecified: Secondary | ICD-10-CM

## 2021-06-03 MED ORDER — EZETIMIBE 10 MG PO TABS: 10.0000 mg | ORAL_TABLET | Freq: Every day | ORAL | 3 refills | Status: DC

## 2021-06-03 NOTE — Progress Notes (Signed)
Prescription sent to pharmacy. Lab orders placed, placed in mail to pt.

## 2021-06-03 NOTE — Telephone Encounter (Signed)
Patient is calling requesting a callback to discuss 05/16 lab results.

## 2021-06-03 NOTE — Telephone Encounter (Signed)
DONE

## 2021-06-03 NOTE — Telephone Encounter (Signed)
Called patient. Patient made aware of the lab results ad Dr. Kennon Holter recommendations. Verbalized understanding. No questions or concerns expressed at this time.  Prescription sent to pharmacy. Lab orders placed, placed in mail to pt.

## 2021-06-19 ENCOUNTER — Other Ambulatory Visit: Payer: Self-pay | Admitting: Internal Medicine

## 2021-06-19 NOTE — Telephone Encounter (Signed)
Next appt scheduled 6/27 with Dr Humphrey Rolls.

## 2021-07-04 ENCOUNTER — Other Ambulatory Visit: Payer: Self-pay | Admitting: Internal Medicine

## 2021-07-06 ENCOUNTER — Encounter: Payer: Self-pay | Admitting: *Deleted

## 2021-07-08 ENCOUNTER — Ambulatory Visit (INDEPENDENT_AMBULATORY_CARE_PROVIDER_SITE_OTHER): Payer: Medicare Other | Admitting: Internal Medicine

## 2021-07-08 ENCOUNTER — Encounter: Payer: Self-pay | Admitting: Internal Medicine

## 2021-07-08 VITALS — BP 125/65 | HR 66 | Temp 98.2°F | Ht 60.0 in | Wt 171.8 lb

## 2021-07-08 DIAGNOSIS — I10 Essential (primary) hypertension: Secondary | ICD-10-CM | POA: Diagnosis not present

## 2021-07-08 DIAGNOSIS — Z87891 Personal history of nicotine dependence: Secondary | ICD-10-CM | POA: Diagnosis not present

## 2021-07-08 DIAGNOSIS — L729 Follicular cyst of the skin and subcutaneous tissue, unspecified: Secondary | ICD-10-CM | POA: Diagnosis not present

## 2021-07-08 DIAGNOSIS — R931 Abnormal findings on diagnostic imaging of heart and coronary circulation: Secondary | ICD-10-CM

## 2021-07-08 DIAGNOSIS — M542 Cervicalgia: Secondary | ICD-10-CM

## 2021-07-08 DIAGNOSIS — K219 Gastro-esophageal reflux disease without esophagitis: Secondary | ICD-10-CM

## 2021-07-08 NOTE — Progress Notes (Signed)
   CC: weight loss/GERD  HPI:  [name] is a 66 y.o. with medical history as below presenting to [facility] for weight loss and GERD.   Please see problem-based list for further details, assessments, and plans.  Past Medical History:  Diagnosis Date   Anemia    Back pain    Endometrial cancer (HCC)    Endometrial polyp    GERD (gastroesophageal reflux disease)    Gout    Heart murmur    Heart palpitations    History of blood transfusion    History of chemotherapy    History of radiation therapy 09/02/2015-10/24/2015   Endometrium 10/15/15- 10/24/15 HDR Treatment, 09/02/15-10/08/15 50 Gy pelvic radiation; Dr. Gery Pray   History of uterine fibroid    Hyperlipidemia    Hypertension    Hypothyroidism    IBS (irritable bowel syndrome)    Joint pain    OSA (obstructive sleep apnea)    moderate OSA per study 06-21-2005--  per pt does need cpap anymore never purchased it   Osteoarthritis    Other fatigue    Other specified disorders of thyroid    PMB (postmenopausal bleeding)    Shortness of breath on exertion    Wears dentures    upper   Wears glasses     Current Outpatient Medications (Endocrine & Metabolic):    levothyroxine (SYNTHROID) 112 MCG tablet, Take 1 tablet (112 mcg total) by mouth daily.  Current Outpatient Medications (Cardiovascular):    amLODipine (NORVASC) 10 MG tablet, Take 1 tablet by mouth once daily   ezetimibe (ZETIA) 10 MG tablet, Take 1 tablet (10 mg total) by mouth daily.   metoprolol succinate (TOPROL-XL) 50 MG 24 hr tablet, TAKE 3 TABLETS BY MOUTH ONCE DAILY WITH MEALS   rosuvastatin (CRESTOR) 40 MG tablet, Take 1 tablet by mouth once daily   spironolactone (ALDACTONE) 25 MG tablet, Take 3 tablets by mouth once daily  Current Outpatient Medications (Respiratory):    fluticasone (FLONASE) 50 MCG/ACT nasal spray, USE 1 TO 2 SPRAYS IN EACH NOSTRIL AS NEEDED FOR TREATMENT OF ALLERGIES  Current Outpatient Medications (Analgesics):     allopurinol (ZYLOPRIM) 100 MG tablet, Take 1 tablet by mouth once daily   Current Outpatient Medications (Other):    Multiple Vitamins-Minerals (CENTRUM PO), Take by mouth.   omeprazole (PRILOSEC) 40 MG capsule, Take 1 capsule (40 mg total) by mouth daily.   potassium chloride (KLOR-CON) 10 MEQ tablet, Take 1 tablet by mouth once daily  Review of Systems:  Review of system negative unless stated in the problem list or HPI.    Physical Exam:  Vitals:   07/08/21 0818 07/08/21 0840  BP: (!) 142/66 125/65  Pulse: 66 66  Temp: 98.2 F (36.8 C)   TempSrc: Oral   SpO2: 100%   Weight: 171 lb 12.8 oz (77.9 kg)   Height: 5' (1.524 m)     Physical Exam General: NAD HENT: NCAT Lungs: CTAB, no wheeze, rhonchi or rales.  Cardiovascular: Normal heart sounds, no r/m/g, 2+ pulses in all extremities. No LE edema Abdomen: No TTP, normal bowel sounds MSK: No asymmetry or muscle atrophy.  Skin: cyst noted on forehead.   Neuro: Alert and oriented x4. CN grossly intact Psych: Normal mood and normal affect   Assessment & Plan:   See Encounters Tab for problem based charting.  Patient discussed with Dr. Odella Aquas, MD

## 2021-07-10 DIAGNOSIS — L729 Follicular cyst of the skin and subcutaneous tissue, unspecified: Secondary | ICD-10-CM | POA: Insufficient documentation

## 2021-07-10 NOTE — Assessment & Plan Note (Signed)
Patient states the neck pain has resolved.

## 2021-07-10 NOTE — Assessment & Plan Note (Addendum)
Patient has cyst on her forehead that has been present for 5-6 years. Ddx include lipoma vs ganglion cyst vs inclusion cyst. On exam no TTP, and is soft on palpation with some mobility. Plan is to refer to dermatology as this has been longstanding and patient would like it to be removed.  -Dermatology referral placed.

## 2021-07-10 NOTE — Assessment & Plan Note (Addendum)
Patient has hx of GERD and was noted to have weight loss of 20 lbs in the last year. Weigh tin April 2022 192, April 2023 172 and today it is 171.GI referral placed during last visit. She is on Protonix 40 mg qd. She states her GERD symptoms have resolved with lifestyle modification including avoidance of fried and  foods, and avoiding laying after eating. No reflux symptoms in the past month. She reports no dysphagia and no odynophagia.  -Continue Protonix 40 mg qd -Instructed patient to have EGD done as she has long standing hx of GERD. Will follow up on the referral.  -She saw gynecologist 2 months ago and was told she was doing well without any sign of recurrence of her uterine cancer.

## 2021-07-10 NOTE — Assessment & Plan Note (Signed)
BP at goal. BP in clinic today 142/66, HR 66 with repeat showing 125/65. Home medications include amlodipine 10 mg, spironolactone 25 mg qd, toprol 50 mg qd. Reports good medication compliance. Tolerating medication without adverse effects. Denies headaches, vision changes, lightheadedness, chest pain, SHOB, leg swelling or changes in speech.  Last creatinine was 0.73 in 03/2021. Counseled on the importance of daily exercise, low salt diet, and weight loss. Continue current meds BP log and bring to next visit  Continue lifestyle changes

## 2021-07-24 NOTE — Progress Notes (Signed)
Internal Medicine Clinic Attending  Case discussed with Dr. Khan  at the time of the visit.  We reviewed the resident's history and exam and pertinent patient test results.  I agree with the assessment, diagnosis, and plan of care documented in the resident's note.  

## 2021-07-31 ENCOUNTER — Other Ambulatory Visit: Payer: Self-pay | Admitting: Internal Medicine

## 2021-07-31 DIAGNOSIS — I10 Essential (primary) hypertension: Secondary | ICD-10-CM

## 2021-07-31 DIAGNOSIS — E876 Hypokalemia: Secondary | ICD-10-CM

## 2021-08-20 ENCOUNTER — Encounter (INDEPENDENT_AMBULATORY_CARE_PROVIDER_SITE_OTHER): Payer: Self-pay

## 2021-08-25 ENCOUNTER — Ambulatory Visit
Admission: RE | Admit: 2021-08-25 | Discharge: 2021-08-25 | Disposition: A | Discharge status: Home / Self Care | Payer: Medicare Other | Source: Ambulatory Visit | Attending: Internal Medicine | Admitting: Internal Medicine

## 2021-08-25 DIAGNOSIS — Z780 Asymptomatic menopausal state: Secondary | ICD-10-CM

## 2021-08-25 DIAGNOSIS — Z0000 Encounter for general adult medical examination without abnormal findings: Secondary | ICD-10-CM

## 2021-08-26 ENCOUNTER — Telehealth: Payer: Self-pay | Admitting: Student

## 2021-08-26 NOTE — Telephone Encounter (Signed)
Pt requesting a call back about her Bone Density Results.

## 2021-08-27 ENCOUNTER — Ambulatory Visit (INDEPENDENT_AMBULATORY_CARE_PROVIDER_SITE_OTHER): Payer: Medicare Other | Admitting: Internal Medicine

## 2021-08-27 ENCOUNTER — Encounter: Payer: Self-pay | Admitting: Internal Medicine

## 2021-08-27 ENCOUNTER — Telehealth: Payer: Self-pay | Admitting: Internal Medicine

## 2021-08-27 VITALS — BP 122/60 | HR 64 | Ht 60.0 in | Wt 171.0 lb

## 2021-08-27 DIAGNOSIS — R931 Abnormal findings on diagnostic imaging of heart and coronary circulation: Secondary | ICD-10-CM | POA: Diagnosis not present

## 2021-08-27 DIAGNOSIS — K219 Gastro-esophageal reflux disease without esophagitis: Secondary | ICD-10-CM | POA: Diagnosis not present

## 2021-08-27 DIAGNOSIS — M810 Age-related osteoporosis without current pathological fracture: Secondary | ICD-10-CM

## 2021-08-27 DIAGNOSIS — R634 Abnormal weight loss: Secondary | ICD-10-CM | POA: Diagnosis not present

## 2021-08-27 NOTE — Patient Instructions (Signed)
_______________________________________________________  If you are age 68 or older, your body mass index should be between 23-30. Your Body mass index is 33.4 kg/m. If this is out of the aforementioned range listed, please consider follow up with your Primary Care Provider.  If you are age 65 or younger, your body mass index should be between 19-25. Your Body mass index is 33.4 kg/m. If this is out of the aformentioned range listed, please consider follow up with your Primary Care Provider.   ________________________________________________________  The [facility] providers would like to encourage you to use [facility] to communicate with providers for non-urgent requests or questions.  Due to long hold times on the telephone, sending your provider a message by [facility] may be a faster and more efficient way to get a response.  Please allow 48 business hours for a response.  Please remember that this is for non-urgent requests.  _______________________________________________________  Please buy a scale and keep track of your weight.  Please call with any questions or concerns.  Thank you for choosing me and [facility].  Gatha Mayer, M.D., [facility]

## 2021-08-27 NOTE — Telephone Encounter (Signed)
Spoke with Ms. Weissmann and let her know that the results of her DEXA were indicative of osteoporosis. Calcium was normal in March of this year at 9.9 however she has not had a recent vitamin D check. I have contacted the front desk to schedule a lab only visit followed by in person follow up around 1 week later to discuss results and next steps including vitamin supplementation and bisphosphonate therapy if necessary based on these results. Patient is in agreement.  Farrel Gordon, DO

## 2021-08-27 NOTE — Progress Notes (Signed)
Lydia Walsh 66 y.o. 1955-05-18 [id number]  Assessment & Plan:   Encounter Diagnoses  Name Primary?   Gastroesophageal reflux disease, unspecified whether esophagitis present Yes   Loss of weight    Her GERD seems well controlled.  Her weight loss may very well be attributable to changing eating (not eating at night) and the walking she is doing with her new dog.  We reviewed the risks benefits and indications for EGD in this setting.  She is not inclined to do 1 so we will not schedule and I think that is reasonable.  She had weights similar to this in 2019 so perhaps there was some weight gain during the pandemic and with these lifestyle changes she has lost.  Her weight seems to have leveled off.  I have asked her to get a scale and follow along with her weight and if it continues to drop (it has stabilized) then we could consider investigating with EGD versus cross-sectional imaging.  CC: Nooruddin, Marlene Lard, MD   Subjective:   Chief Complaint: GERD, weight loss  HPI 66 year old woman here at the request of the internal medicine clinic because of weight loss and a history of GERD.  Here is what they wrote on 07/08/2021 assessment and plan:  Patient has hx of GERD and was noted to have weight loss of 20 lbs in the last year. Weigh tin April 2022 192, April 2023 172 and today it is 171.GI referral placed during last visit. She is on Protonix 40 mg qd. She states her GERD symptoms have resolved with lifestyle modification including avoidance of fried and  foods, and avoiding laying after eating. No reflux symptoms in the past month. She reports no dysphagia and no odynophagia.  -Continue Protonix 40 mg qd -Instructed patient to have EGD done as she has long standing hx of GERD. Will follow up on the referral.  -She saw gynecologist 2 months ago and was told she was doing well without any sign of recurrence of her uterine cancer.    Wt Readings from Last 3 Encounters:  08/27/21 171  lb (77.6 kg)  07/08/21 171 lb 12.8 oz (77.9 kg)  05/27/21 170 lb (77.1 kg)  179 pounds August 2019, 170 pounds November 2018 193 pounds February 2021 194 pounds April 2022 183 pounds July 2022 173 pounds February 2023  She tells me she feels well there is an occasional sore throat and occasional diarrhea (history of IBS) but actually feels better with the weight loss.  She reports that she stopped eating in the evenings after getting home from work (working as a Radio broadcast assistant at [employer]) and has been walking a dog that she got 6 months ago.  She does not have any dysphagia nausea or vomiting abdominal pain or significant heartburn issues.  She does take a PPI regularly.  Appetite is good.  She was last seen by me in 2021 with some pill dysphagia and a sore throat issue.  She went to ENT and had a negative evaluation.  She had a negative colonoscopy in 2018 and a remote EGD in 2007 for low ferritin but no anemia that was remarkable for mild gastroduodenitis.    Allergies  Allergen Reactions   Shrimp (Diagnostic)     Anaphylaxis   Hctz [Hydrochlorothiazide] Rash   Current Meds  Medication Sig   allopurinol (ZYLOPRIM) 100 MG tablet Take 1 tablet by mouth once daily   amLODipine (NORVASC) 10 MG tablet Take 1 tablet by mouth  once daily   ezetimibe (ZETIA) 10 MG tablet Take 1 tablet (10 mg total) by mouth daily.   fluticasone (FLONASE) 50 MCG/ACT nasal spray USE 1 TO 2 SPRAYS IN EACH NOSTRIL AS NEEDED FOR TREATMENT OF ALLERGIES   levothyroxine (SYNTHROID) 112 MCG tablet Take 1 tablet (112 mcg total) by mouth daily.   metoprolol succinate (TOPROL-XL) 50 MG 24 hr tablet TAKE 3 TABLETS BY MOUTH ONCE DAILY WITH MEALS   Multiple Vitamins-Minerals (CENTRUM PO) Take by mouth.   omeprazole (PRILOSEC) 40 MG capsule Take 1 capsule (40 mg total) by mouth daily.   potassium chloride (KLOR-CON) 10 MEQ tablet Take 1 tablet by mouth once daily   rosuvastatin (CRESTOR) 40 MG tablet Take 1  tablet by mouth once daily   spironolactone (ALDACTONE) 25 MG tablet Take 3 tablets by mouth once daily   Past Medical History:  Diagnosis Date   Anemia    Back pain    Endometrial cancer (HCC)    Endometrial polyp    GERD (gastroesophageal reflux disease)    Gout    Heart murmur    Heart palpitations    History of blood transfusion    History of chemotherapy    History of radiation therapy 09/02/2015-10/24/2015   Endometrium 10/15/15- 10/24/15 HDR Treatment, 09/02/15-10/08/15 50 Gy pelvic radiation; Dr. Gery Pray   History of uterine fibroid    Hyperlipidemia    Hypertension    Hypothyroidism    IBS (irritable bowel syndrome)    Joint pain    OSA (obstructive sleep apnea)    moderate OSA per study 06-21-2005--  per pt does need cpap anymore never purchased it   Osteoarthritis    Other fatigue    Other specified disorders of thyroid    PMB (postmenopausal bleeding)    Shortness of breath on exertion    Wears dentures    upper   Wears glasses    Past Surgical History:  Procedure Laterality Date   ABDOMINAL HYSTERECTOMY     BREAST EXCISIONAL BIOPSY Right    benign   CHOLECYSTECTOMY     CHOLECYSTECTOMY, LAPAROSCOPIC  2020   COLONOSCOPY     DILATATION & CURETTAGE/HYSTEROSCOPY WITH MYOSURE N/A 03/07/2015   Procedure: DILATATION & CURETTAGE/HYSTEROSCOPY WITH MYOSURE (POLYP);  Surgeon: Arvella Nigh, MD;  Location: [facility];  Service: Gynecology;  Laterality: N/A;   DILATATION & CURRETTAGE/HYSTEROSCOPY WITH RESECTOCOPE  12-13-2002  &  09-26-2004   polyp/  submucosal fibroid   ESOPHAGOGASTRODUODENOSCOPY     EXCISION BENIGN RIGHT BREAST MASS  age 18   IR REMOVAL TUN ACCESS W/ PORT W/O FL MOD SED  07/17/2016   KNEE ARTHROSCOPY W/ MENISCECTOMY Bilateral right 04-12-2006//  left 12-29-2006   and Chondroplasty   port placement     ROBOTIC ASSISTED TOTAL HYSTERECTOMY WITH BILATERAL SALPINGO OOPHERECTOMY Bilateral 05/02/2015   Procedure: XI ROBOTIC ASSISTED TOTAL  HYSTERECTOMY WITH BILATERAL SALPINGO OOPHORECTOMY AND SENTINAL LYMPH NODE BIOPSY;  Surgeon: Janie Morning, MD;  Location: [facility];  Service: Gynecology;  Laterality: Bilateral;   TRANSTHORACIC ECHOCARDIOGRAM  04-02-2010   normal LV, ef 65%   TUBAL LIGATION  1980's   Social History   Social History Narrative   Current Social History 11/11/2020        Patient lives alone in an apartment which is 1 story. There are not steps up to the entrance the patient uses.       Patient's method of transportation is personal car.      The highest level  of education was high school diploma.      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      Identified important Relationships are "my kids"       Pets : poodle       Interests / Fun: "I love cooking"      Current Stressors: "none"       Religious / Personal Beliefs: "Holiness"    family history includes Alcoholism in her father; Cirrhosis in her father; Heart disease in her son; Heart failure in her mother; Hypertension in her mother and son; Liver disease in her father; Stroke in her mother; Sudden death in her mother.   Review of Systems As per HPI  Objective:   Physical Exam '@BP'$  122/60   Pulse 64   Ht 5' (1.524 m)   Wt 171 lb (77.6 kg)   LMP 07/16/2014   BMI 33.40 kg/m @  General:  NAD Eyes:   anicteric Lungs:  clear Heart::  S1S2 no rubs, murmurs or gallops Abdomen:  soft and nontender, BS+     Data Reviewed:  See HPI

## 2021-10-02 ENCOUNTER — Ambulatory Visit (INDEPENDENT_AMBULATORY_CARE_PROVIDER_SITE_OTHER): Payer: Medicare Other

## 2021-10-02 VITALS — BP 131/62 | HR 63 | Temp 97.8°F | Ht 60.0 in | Wt 179.0 lb

## 2021-10-02 DIAGNOSIS — M170 Bilateral primary osteoarthritis of knee: Secondary | ICD-10-CM

## 2021-10-02 DIAGNOSIS — M810 Age-related osteoporosis without current pathological fracture: Secondary | ICD-10-CM | POA: Diagnosis not present

## 2021-10-02 DIAGNOSIS — R931 Abnormal findings on diagnostic imaging of heart and coronary circulation: Secondary | ICD-10-CM

## 2021-10-02 DIAGNOSIS — M549 Dorsalgia, unspecified: Secondary | ICD-10-CM | POA: Diagnosis present

## 2021-10-02 DIAGNOSIS — K649 Unspecified hemorrhoids: Secondary | ICD-10-CM | POA: Insufficient documentation

## 2021-10-02 MED ORDER — DICLOFENAC SODIUM 1 % EX GEL: 4.0000 g | Freq: Four times a day (QID) | CUTANEOUS | 1 refills | Status: AC

## 2021-10-02 NOTE — Assessment & Plan Note (Signed)
Patient reports 2 weeks of pain with wiping.  She denies seeing any blood or having identified any external hemorrhoids.  She states she has been constipated previously but has not constipated today.  This is the first time she was dealt with anything like this.  -Encouraged use of witch hazel wipes or Preparation H

## 2021-10-02 NOTE — Progress Notes (Addendum)
   CC: Back pain  HPI:  [name] is a 66 y.o. with past medical history as below who presents for back pain.  Past Medical History:  Diagnosis Date   Anemia    Back pain    Endometrial cancer (HCC)    Endometrial polyp    GERD (gastroesophageal reflux disease)    Gout    Heart murmur    Heart palpitations    History of blood transfusion    History of chemotherapy    History of radiation therapy 09/02/2015-10/24/2015   Endometrium 10/15/15- 10/24/15 HDR Treatment, 09/02/15-10/08/15 50 Gy pelvic radiation; Dr. Gery Pray   History of uterine fibroid    Hyperlipidemia    Hypertension    Hypothyroidism    IBS (irritable bowel syndrome)    Joint pain    OSA (obstructive sleep apnea)    moderate OSA per study 06-21-2005--  per pt does need cpap anymore never purchased it   Osteoarthritis    Other fatigue    Other specified disorders of thyroid    PMB (postmenopausal bleeding)    Shortness of breath on exertion    Wears dentures    upper   Wears glasses    Review of Systems: See detailed assessment and plan for pertinent ROS.  Physical Exam:  Vitals:   10/02/21 0834  BP: 131/62  Pulse: 63  Temp: 97.8 F (36.6 C)  TempSrc: Oral  SpO2: 100%  Weight: 179 lb (81.2 kg)  Height: 5' (1.524 m)   Physical Exam Constitutional:      General: She is not in acute distress. HENT:     Head: Normocephalic and atraumatic.  Eyes:     Extraocular Movements: Extraocular movements intact.  Cardiovascular:     Rate and Rhythm: Normal rate and regular rhythm.  Pulmonary:     Effort: Pulmonary effort is normal.     Breath sounds: Normal breath sounds.  Musculoskeletal:     Comments: Tenderness to palpation at knee joint line bilaterally.  No effusions palpated.  Range of motion limited on extension due to pain.  Neurological:     Mental Status: She is alert.      Assessment & Plan:   See Encounters Tab for problem based charting.  Primary osteoarthritis of knees,  bilateral Patient presents with chronic bilateral knee pain.  Patient had knee imaging a decade ago with severe tricompartmental disease.  States her knee pain is worsened over the past month.  She has stiffness for about 30 minutes when she wakes up.  She has gotten steroid injections before but is not interested in any at this time.  She is not interested in surgery.  -Voltaren gel -X-ray knee left, right -Tylenol 1000 mg 3 times daily as needed -patient would benefit from rolling walker  Hemorrhoid Patient reports 2 weeks of pain with wiping.  She denies seeing any blood or having identified any external hemorrhoids.  She states she has been constipated previously but has not constipated today.  This is the first time she was dealt with anything like this.  -Encouraged use of witch hazel wipes or Preparation H    Patient seen with Dr. Daryll Drown

## 2021-10-02 NOTE — Assessment & Plan Note (Addendum)
Patient presents with chronic bilateral knee pain.  Patient had knee imaging a decade ago with severe tricompartmental disease.  States her knee pain is worsened over the past month.  She has stiffness for about 30 minutes when she wakes up.  She has gotten steroid injections before but is not interested in any at this time.  She is not interested in surgery.  -Voltaren gel -X-ray knee left, right -Tylenol 1000 mg 3 times daily as needed -patient would benefit from rolling walker

## 2021-10-02 NOTE — Patient Instructions (Addendum)
[name], it was a pleasure seeing you today!  Today we discussed: Knee pain: Take tylenol three times daily as needed for pain up to 3000 mg daily. Apply your Voltaren gel to your kneesup to 4 times daily as needed. Please schedule knee x-rays at your convenience. Hemorrhoids: Please use witch hazel wipes or Preparation H as needed for pain due to hemorrhoids.     I have ordered the following labs today:  Lab Orders  No laboratory test(s) ordered today     Tests ordered today:  Knee xray bilat  Referrals ordered today:   Referral Orders  No referral(s) requested today     I have ordered the following medication/changed the following medications:   Stop the following medications: There are no discontinued medications.   Start the following medications: Meds ordered this encounter  Medications   diclofenac Sodium (VOLTAREN) 1 % GEL    Sig: Apply 4 g topically 4 (four) times daily.    Dispense:  350 g    Refill:  1     Follow-up:  4 weeks    Please make sure to arrive 15 minutes prior to your next appointment. If you arrive late, you may be asked to reschedule.   We look forward to seeing you next time. Please call our clinic at [phone] if you have any questions or concerns. The best time to call is Monday-Friday from 9am-4pm, but there is someone available 24/7. If after hours or the weekend, call the main hospital number and ask for the Internal Medicine Resident On-Call. If you need medication refills, please notify your pharmacy one week in advance and they will send [place] a request.  Thank you for letting us take part in your care. Wishing you the best!  Thank you, Linward Natal, MD

## 2021-10-03 LAB — BMP8+ANION GAP
Anion Gap: 20 mmol/L — ABNORMAL HIGH (ref 10.0–18.0)
BUN/Creatinine Ratio: 19 (ref 12–28)
BUN: 14 mg/dL (ref 8–27)
CO2: 22 mmol/L (ref 20–29)
Calcium: 9.9 mg/dL (ref 8.7–10.3)
Chloride: 102 mmol/L (ref 96–106)
Creatinine, Ser: 0.75 mg/dL (ref 0.57–1.00)
Glucose: 116 mg/dL — ABNORMAL HIGH (ref 70–99)
Potassium: 4.4 mmol/L (ref 3.5–5.2)
Sodium: 144 mmol/L (ref 134–144)
eGFR: 87 mL/min/{1.73_m2} (ref >59)

## 2021-10-03 LAB — VITAMIN D 25 HYDROXY (VIT D DEFICIENCY, FRACTURES): Vit D, 25-Hydroxy: 34.1 ng/mL (ref 30.0–100.0)

## 2021-10-07 ENCOUNTER — Other Ambulatory Visit: Payer: Self-pay | Admitting: Internal Medicine

## 2021-10-09 NOTE — Progress Notes (Signed)
Internal Medicine Clinic Attending  Case discussed with Dr. White  at the time of the visit.  We reviewed the resident's history and exam and pertinent patient test results.  I agree with the assessment, diagnosis, and plan of care documented in the resident's note.  

## 2021-10-13 NOTE — Telephone Encounter (Signed)
Received fax from [facility] requesting new Rx for Flonase stating frequency of dosing.

## 2021-10-15 ENCOUNTER — Other Ambulatory Visit: Payer: Self-pay | Admitting: Student

## 2021-10-15 ENCOUNTER — Other Ambulatory Visit: Payer: Self-pay | Admitting: Internal Medicine

## 2021-10-15 DIAGNOSIS — E039 Hypothyroidism, unspecified: Secondary | ICD-10-CM

## 2021-10-17 ENCOUNTER — Telehealth: Payer: Self-pay | Admitting: *Deleted

## 2021-10-17 NOTE — Telephone Encounter (Signed)
Patient called in requesting rolling walker. States she discussed this with Provider at [place] on 9/21. Patient did not realize that there were orders for her to have bilateral knee x-rays. She will have these done on 10/9.

## 2021-10-18 NOTE — Addendum Note (Signed)
Addended by: Linward Natal on: 10/18/2021 05:47 AM   Modules accepted: Orders

## 2021-10-20 ENCOUNTER — Other Ambulatory Visit: Payer: Self-pay | Admitting: Internal Medicine

## 2021-10-20 MED ORDER — ALENDRONATE SODIUM 70 MG PO TABS: 70.0000 mg | ORAL_TABLET | ORAL | 11 refills | Status: DC

## 2021-10-20 NOTE — Telephone Encounter (Signed)
CM sent to Stephannie Peters at [facility] for rolling walker. F2F was 10/02/21.

## 2021-10-20 NOTE — Progress Notes (Signed)
Contacted patient to review lab results collected at last OV and further plans. She has DEXA-proven osteoporosis and vitamin D/calcium deficiency have been ruled out as causes. I have sent in alendronate 70 mg once weekly to patient's pharmacy of choice. I educated her on taking the medication with a full glass of water and staying upright for at least 30 minutes afterwards. She voiced understanding and had no further questions.  Farrel Gordon, DO

## 2021-10-29 ENCOUNTER — Other Ambulatory Visit: Payer: Self-pay | Admitting: Student

## 2021-10-29 DIAGNOSIS — E876 Hypokalemia: Secondary | ICD-10-CM

## 2021-11-05 ENCOUNTER — Other Ambulatory Visit: Payer: Self-pay | Admitting: Student

## 2021-11-05 ENCOUNTER — Other Ambulatory Visit: Payer: Self-pay | Admitting: Internal Medicine

## 2021-11-05 DIAGNOSIS — K219 Gastro-esophageal reflux disease without esophagitis: Secondary | ICD-10-CM

## 2021-11-05 DIAGNOSIS — I10 Essential (primary) hypertension: Secondary | ICD-10-CM

## 2021-11-10 ENCOUNTER — Encounter: Payer: Medicare Other | Admitting: Student

## 2021-11-13 ENCOUNTER — Other Ambulatory Visit: Payer: Self-pay | Admitting: Student

## 2021-11-13 DIAGNOSIS — I10 Essential (primary) hypertension: Secondary | ICD-10-CM

## 2021-11-14 ENCOUNTER — Other Ambulatory Visit: Payer: Self-pay | Admitting: Student

## 2021-11-14 DIAGNOSIS — I10 Essential (primary) hypertension: Secondary | ICD-10-CM

## 2021-11-21 ENCOUNTER — Other Ambulatory Visit: Payer: Self-pay | Admitting: Student

## 2021-11-21 DIAGNOSIS — E039 Hypothyroidism, unspecified: Secondary | ICD-10-CM

## 2021-11-24 ENCOUNTER — Ambulatory Visit: Payer: Medicare Other

## 2021-11-24 ENCOUNTER — Encounter: Payer: Medicare Other | Admitting: Student

## 2021-12-10 ENCOUNTER — Other Ambulatory Visit: Payer: Self-pay | Admitting: Student

## 2021-12-15 ENCOUNTER — Other Ambulatory Visit: Payer: Self-pay

## 2021-12-15 MED ORDER — FLUTICASONE PROPIONATE 50 MCG/ACT NA SUSP: NASAL | 0 refills | Status: DC

## 2021-12-20 ENCOUNTER — Other Ambulatory Visit: Payer: Self-pay | Admitting: Student

## 2021-12-20 DIAGNOSIS — E039 Hypothyroidism, unspecified: Secondary | ICD-10-CM

## 2022-01-14 ENCOUNTER — Other Ambulatory Visit: Payer: Self-pay | Admitting: Internal Medicine

## 2022-01-14 DIAGNOSIS — E039 Hypothyroidism, unspecified: Secondary | ICD-10-CM

## 2022-01-29 ENCOUNTER — Other Ambulatory Visit: Payer: Self-pay | Admitting: Student

## 2022-01-29 ENCOUNTER — Other Ambulatory Visit: Payer: Self-pay | Admitting: Internal Medicine

## 2022-01-29 DIAGNOSIS — E876 Hypokalemia: Secondary | ICD-10-CM

## 2022-01-29 DIAGNOSIS — I10 Essential (primary) hypertension: Secondary | ICD-10-CM

## 2022-01-30 NOTE — Telephone Encounter (Signed)
Next appt scheduled 02/23/22 with PCP.  

## 2022-02-12 ENCOUNTER — Other Ambulatory Visit: Payer: Self-pay | Admitting: Student

## 2022-02-12 DIAGNOSIS — I10 Essential (primary) hypertension: Secondary | ICD-10-CM

## 2022-02-15 ENCOUNTER — Other Ambulatory Visit: Payer: Self-pay | Admitting: Internal Medicine

## 2022-02-15 ENCOUNTER — Other Ambulatory Visit: Payer: Self-pay | Admitting: Cardiovascular Disease

## 2022-02-15 DIAGNOSIS — E039 Hypothyroidism, unspecified: Secondary | ICD-10-CM

## 2022-02-16 NOTE — Telephone Encounter (Signed)
Next appt scheduled 02/23/22 with PCP. 

## 2022-02-17 ENCOUNTER — Encounter: Payer: Self-pay | Admitting: Emergency Medicine

## 2022-02-17 ENCOUNTER — Other Ambulatory Visit: Payer: Self-pay

## 2022-02-17 ENCOUNTER — Ambulatory Visit
Admission: EM | Admit: 2022-02-17 | Discharge: 2022-02-17 | Disposition: A | Discharge status: Home / Self Care | Payer: Medicare Other

## 2022-02-17 DIAGNOSIS — S40021A Contusion of right upper arm, initial encounter: Secondary | ICD-10-CM

## 2022-02-17 DIAGNOSIS — M79672 Pain in left foot: Secondary | ICD-10-CM | POA: Diagnosis not present

## 2022-02-17 NOTE — Discharge Instructions (Signed)
Advised to just watch the areas as the bruising will resolve over the next week.  Take Tylenol only as needed for pain and discomfort.  Advised to increase fluid intake make sure you are getting enough fluids to prevent cramping since you are taking the fluid pill and potassium.  Advised follow-up PCP or return to urgent care as needed.

## 2022-02-17 NOTE — ED Provider Notes (Signed)
[facility]    CSN: [id number] Arrival date & time: 02/17/22  0813      History   Chief Complaint Chief Complaint  Patient presents with   Fall    HPI Lydia Walsh is a 67 y.o. female.   67 year old female presents with right arm pain and left foot pain.  Patient indicates 2 days ago she was getting out of her car when she fell onto the concrete on her right side.  Patient indicates since she has noticed some bruising on her right upper arm with mild pain and discomfort.  She also indicates she is having some left foot pain and discomfort when she stands and walks.  She indicates the pain is mainly at the arch area.  She indicates that she was wearing boots in the these tend to aggravate her foot however when she put some tennis shoes it feels a lot better and she does not have any pain or discomfort.  Patient indicates that she is not having any right upper arm pain, weakness, numbness or tingling.  She is tolerating fluids well. Patient was concerned about her blood pressure medicines as she has been getting some cramping although it tends to improve when she increases her fluid intake.  She does have a PCP that she is due to see the next several weeks.   Fall    Past Medical History:  Diagnosis Date   Anemia    Back pain    Endometrial cancer (HCC)    Endometrial polyp    GERD (gastroesophageal reflux disease)    Gout    Heart murmur    Heart palpitations    History of blood transfusion    History of chemotherapy    History of radiation therapy 09/02/2015-10/24/2015   Endometrium 10/15/15- 10/24/15 HDR Treatment, 09/02/15-10/08/15 50 Gy pelvic radiation; Dr. Gery Pray   History of uterine fibroid    Hyperlipidemia    Hypertension    Hypothyroidism    IBS (irritable bowel syndrome)    Joint pain    OSA (obstructive sleep apnea)    moderate OSA per study 06-21-2005--  per pt does need cpap anymore never purchased it   Osteoarthritis    Other fatigue     Other specified disorders of thyroid    PMB (postmenopausal bleeding)    Shortness of breath on exertion    Wears dentures    upper   Wears glasses     Patient Active Problem List   Diagnosis Date Noted   Hemorrhoid 10/02/2021   Skin cyst 07/10/2021   Unintentional weight loss 05/11/2021   Left sided abdominal pain 03/11/2021   Morbid obesity ([place]) 05/28/2020   Elevated coronary artery calcium score 09/09/2018   Neck pain 07/28/2018   Primary osteoarthritis of knees, bilateral 11/18/2016   AV block, 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   Restless leg syndrome 06/01/2016   Chronic pain of left knee 02/03/2016   Odynophagia 09/03/2015   Port catheter in place 08/20/2015   Hypokalemia 07/24/2015   [employer] 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 ([place]) 07/10/2015   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[place]) 07/10/2015   Rash 07/02/2015   Renal mass, left 06/21/2015   Nephrolithiasis 06/21/2015   Obesity (BMI 30-39.9) 06/21/2015   Iron deficiency anemia due to chronic blood loss 06/21/2015   Endometrial cancer ([place]) 03/29/2015   Muscle cramp, nocturnal 07/26/2014   Major depression 06/02/2013   Fatigue 05/02/2013   Healthcare maintenance 08/19/2012  Osteoarthritis    LOW BACK PAIN, CHRONIC 02/27/2008   GOUT 11/24/2007   ALLERGIC RHINITIS, SEASONAL 03/09/2006   G E R D 02/19/2006   Hypothyroidism 02/17/2006   Dyslipidemia 02/17/2006   Essential hypertension 02/17/2006   OBSTRUCTIVE SLEEP APNEA 06/21/2005    Past Surgical History:  Procedure Laterality Date   ABDOMINAL HYSTERECTOMY     BREAST EXCISIONAL BIOPSY Right    benign   CHOLECYSTECTOMY     CHOLECYSTECTOMY, LAPAROSCOPIC  2020   COLONOSCOPY     DILATATION & CURETTAGE/HYSTEROSCOPY WITH MYOSURE N/A 03/07/2015   Procedure: DILATATION & CURETTAGE/HYSTEROSCOPY WITH MYOSURE (POLYP);  Surgeon: Arvella Nigh, MD;  Location: [facility];  Service: Gynecology;   Laterality: N/A;   DILATATION & CURRETTAGE/HYSTEROSCOPY WITH RESECTOCOPE  12-13-2002  &  09-26-2004   polyp/  submucosal fibroid   ESOPHAGOGASTRODUODENOSCOPY     EXCISION BENIGN RIGHT BREAST MASS  age 48   IR REMOVAL TUN ACCESS W/ PORT W/O FL MOD SED  07/17/2016   KNEE ARTHROSCOPY W/ MENISCECTOMY Bilateral right 04-12-2006//  left 12-29-2006   and Chondroplasty   port placement     ROBOTIC ASSISTED TOTAL HYSTERECTOMY WITH BILATERAL SALPINGO OOPHERECTOMY Bilateral 05/02/2015   Procedure: XI ROBOTIC ASSISTED TOTAL HYSTERECTOMY WITH BILATERAL SALPINGO OOPHORECTOMY AND SENTINAL LYMPH NODE BIOPSY;  Surgeon: Janie Morning, MD;  Location: [facility];  Service: Gynecology;  Laterality: Bilateral;   TRANSTHORACIC ECHOCARDIOGRAM  04-02-2010   normal LV, ef 65%   TUBAL LIGATION  1980's    OB History     Gravida  3   Para  3   Term      Preterm      AB      Living         SAB      IAB      Ectopic      Multiple      Live Births               Home Medications    Prior to Admission medications   Medication Sig Start Date End Date Taking? Authorizing Provider  alendronate (FOSAMAX) 70 MG tablet Take 1 tablet (70 mg total) by mouth every 7 (seven) days. Take with a full glass of water on an empty stomach. 10/20/21 10/20/22  Farrel Gordon, DO  allopurinol (ZYLOPRIM) 100 MG tablet Take 1 tablet by mouth once daily 02/03/22   Nooruddin, Marlene Lard, MD  amLODipine (NORVASC) 10 MG tablet Take 1 tablet by mouth once daily 02/03/22   Nooruddin, Marlene Lard, MD  ezetimibe (ZETIA) 10 MG tablet Take 1 tablet (10 mg total) by mouth daily. 06/03/21   Lorretta Harp, MD  fluticasone (FLONASE) 50 MCG/ACT nasal spray USE 1 TO 2 SPRAYS IN EACH NOSTRIL AS NEEDED FOR TREATMENT OF ALLERGIES 12/15/21   Nooruddin, Marlene Lard, MD  levothyroxine (SYNTHROID) 112 MCG tablet Take 1 tablet by mouth once daily 02/17/22   Nooruddin, Marlene Lard, MD  metoprolol succinate (TOPROL-XL) 50 MG 24 hr tablet TAKE 3 TABLETS BY MOUTH ONCE DAILY WITH  MEALS 05/27/21   Wayland Denis, MD  Multiple Vitamins-Minerals (CENTRUM PO) Take by mouth.    [provider]  omeprazole (PRILOSEC) 40 MG capsule Take 1 capsule by mouth once daily 11/10/21   Nooruddin, Marlene Lard, MD  potassium chloride (KLOR-CON) 10 MEQ tablet Take 1 tablet by mouth once daily 02/03/22   Nooruddin, Marlene Lard, MD  rosuvastatin (CRESTOR) 40 MG tablet Take 1 tablet by mouth once daily 01/29/21   Lorretta Harp, MD  spironolactone (ALDACTONE) 25 MG tablet Take 3 tablets by mouth once daily 02/12/22   Nooruddin, Marlene Lard, MD  esomeprazole (NEXIUM) 20 MG capsule Take 2 capsules (40 mg total) by mouth daily before breakfast. 10/15/10 04/08/11  Rosalia Hammers, MD    Family History Family History  Problem Relation Age of Onset   Hypertension Mother    Heart failure Mother    Sudden death Mother    Stroke Mother    Cirrhosis Father        alcoholic   Liver disease Father    Alcoholism Father    Heart disease Son    Hypertension Son    Colon cancer Neg Hx    Stomach cancer Neg Hx    Rectal cancer Neg Hx    Esophageal cancer Neg Hx    Liver cancer Neg Hx     Social History Social History   Tobacco Use   Smoking status: Former    Years: 2.00    Types: Cigarettes    Quit date: 02/28/1994    Years since quitting: 27.9   Smokeless tobacco: Never  Vaping Use   Vaping Use: Never used  Substance Use Topics   Alcohol use: No    Alcohol/week: 0.0 standard drinks of alcohol   Drug use: No     Allergies   Shrimp (diagnostic) and Hctz [hydrochlorothiazide]   Review of Systems Review of Systems  Musculoskeletal:  Positive for gait problem (left foot pain).     Physical Exam Triage Vital Signs ED Triage Vitals  Enc Vitals Group     BP 02/17/22 0844 135/65     Pulse Rate 02/17/22 0844 74     Resp 02/17/22 0844 18     Temp 02/17/22 0844 98.2 F (36.8 C)     Temp Source 02/17/22 0844 Oral     SpO2 02/17/22 0844 97 %     Weight --      Height --      Head  Circumference --      Peak Flow --      Pain Score 02/17/22 0845 5     Pain Loc --      Pain Edu? --      Excl. in [place]? --    No data found.  Updated Vital Signs BP 135/65 (BP Location: Left Arm)   Pulse 74   Temp 98.2 F (36.8 C) (Oral)   Resp 18   LMP 07/16/2014   SpO2 97%   Visual Acuity Right Eye Distance:   Left Eye Distance:   Bilateral Distance:    Right Eye Near:   Left Eye Near:    Bilateral Near:     Physical Exam Constitutional:      Appearance: Normal appearance.  Cardiovascular:     Rate and Rhythm: Normal rate and regular rhythm.     Heart sounds: Normal heart sounds.  Pulmonary:     Effort: Pulmonary effort is normal.     Breath sounds: Normal breath sounds and air entry. No wheezing, rhonchi or rales.  Musculoskeletal:       Arms:       Legs:     Comments: Right upper arm: There is mild bruising noticed midshaft right upper arm lateral aspect that measures 4 x 3 cm, this is mainly superficial and is fading.  Full range of motion is normal, external/internal rotations are normal without restriction, strength is normal.  Stability is intact.  Left foot: There is no unusual redness or  swelling of the dorsum, plantar, or medial aspect of the foot.  Full range of motion is intact, stability is intact, strength is intact, normal varus valgus stressing.  Neurological:     Mental Status: She is alert.      UC Treatments / Results  Labs (all labs ordered are listed, but only abnormal results are displayed) Labs Reviewed - No data to display  EKG   Radiology No results found.  Procedures Procedures (including critical care time)  Medications Ordered in UC Medications - No data to display  Initial Impression / Assessment and Plan / UC Course  I have reviewed the triage vital signs and the nursing notes.  Pertinent labs & imaging results that were available during my care of the patient were reviewed by me and considered in my medical decision  making (see chart for details).    Plan: The diagnosis will be treated with the following: 1.  Right arm contusion: A.  Advised Tylenol only if needed for pain otherwise watchful waiting as this will heal over the next week. 2.  Left foot pain: A.  Advised Tylenol only if needed for pain otherwise watchful waiting. 3.  Advised follow-up PCP return to urgent care as needed. Final Clinical Impressions(s) / UC Diagnoses   Final diagnoses:  Arm contusion, right, initial encounter  Left foot pain     Discharge Instructions      Advised to just watch the areas as the bruising will resolve over the next week.  Take Tylenol only as needed for pain and discomfort.  Advised to increase fluid intake make sure you are getting enough fluids to prevent cramping since you are taking the fluid pill and potassium.  Advised follow-up PCP or return to urgent care as needed.    ED Prescriptions   None    PDMP not reviewed this encounter.   Nyoka Lint, PA-C 02/17/22 [phone]

## 2022-02-17 NOTE — ED Triage Notes (Signed)
Pt sts right arm pain and left foot pain since trip and fall 2 days ago; pt sts bruising noted to right arm

## 2022-02-19 ENCOUNTER — Other Ambulatory Visit: Payer: Self-pay | Admitting: Internal Medicine

## 2022-02-19 DIAGNOSIS — E039 Hypothyroidism, unspecified: Secondary | ICD-10-CM

## 2022-02-20 ENCOUNTER — Other Ambulatory Visit: Payer: Self-pay | Admitting: Cardiovascular Disease

## 2022-02-23 ENCOUNTER — Ambulatory Visit (INDEPENDENT_AMBULATORY_CARE_PROVIDER_SITE_OTHER): Payer: Medicare Other

## 2022-02-23 ENCOUNTER — Ambulatory Visit (INDEPENDENT_AMBULATORY_CARE_PROVIDER_SITE_OTHER): Payer: Medicare Other | Admitting: Student

## 2022-02-23 ENCOUNTER — Other Ambulatory Visit: Payer: Self-pay | Admitting: Student

## 2022-02-23 ENCOUNTER — Encounter: Payer: Self-pay | Admitting: Student

## 2022-02-23 ENCOUNTER — Other Ambulatory Visit: Payer: Self-pay

## 2022-02-23 ENCOUNTER — Telehealth: Payer: Self-pay | Admitting: *Deleted

## 2022-02-23 VITALS — BP 122/73 | HR 66 | Temp 98.7°F | Ht 60.0 in | Wt 182.0 lb

## 2022-02-23 VITALS — BP 122/53 | HR 66 | Temp 98.7°F | Resp 28 | Ht 60.0 in | Wt 182.0 lb

## 2022-02-23 DIAGNOSIS — M810 Age-related osteoporosis without current pathological fracture: Secondary | ICD-10-CM | POA: Diagnosis not present

## 2022-02-23 DIAGNOSIS — M170 Bilateral primary osteoarthritis of knee: Secondary | ICD-10-CM | POA: Diagnosis not present

## 2022-02-23 DIAGNOSIS — I10 Essential (primary) hypertension: Secondary | ICD-10-CM

## 2022-02-23 DIAGNOSIS — Z0000 Encounter for general adult medical examination without abnormal findings: Secondary | ICD-10-CM

## 2022-02-23 DIAGNOSIS — Z87891 Personal history of nicotine dependence: Secondary | ICD-10-CM | POA: Diagnosis not present

## 2022-02-23 DIAGNOSIS — K219 Gastro-esophageal reflux disease without esophagitis: Secondary | ICD-10-CM

## 2022-02-23 HISTORY — DX: Age-related osteoporosis without current pathological fracture: M81.0

## 2022-02-23 MED ORDER — VITAMIN D (ERGOCALCIFEROL) 1.25 MG (50000 UNIT) PO CAPS: 50000.0000 [IU] | ORAL_CAPSULE | ORAL | 0 refills | Status: DC

## 2022-02-23 MED ORDER — ALENDRONATE SODIUM 70 MG PO TABS: 70.0000 mg | ORAL_TABLET | ORAL | 11 refills | Status: AC

## 2022-02-23 NOTE — Progress Notes (Signed)
Subjective:   Lydia Walsh is a 67 y.o. female who presents for Medicare Annual (Subsequent) preventive examination. I connected with  LASONDRA RABBITT on 02/23/22 by a  Face-To-Face  enabled telemedicine application and verified that I am speaking with the correct person using two identifiers.  Patient Location: Other:  Office/Clinic  Provider Location: Office/Clinic  I discussed the limitations of evaluation and management by telemedicine. The patient expressed understanding and agreed to proceed.  Review of Systems    Defer to PCP       Objective:    Today's Vitals   02/23/22 1602 02/23/22 1603  BP: 122/73   Pulse: 66   Temp: 98.7 F (37.1 C)   TempSrc: Oral   SpO2: 100%   Weight: 182 lb (82.6 kg)   Height: 5' (1.524 m)   PainSc:  7    Body mass index is 35.54 kg/m.     02/23/2022    4:05 PM 02/23/2022    8:37 AM 10/02/2021    8:36 AM 07/08/2021    8:20 AM 05/09/2021    9:17 AM 05/05/2021    1:53 PM 03/11/2021    9:26 AM  Advanced Directives  Does Patient Have a Medical Advance Directive? Yes Yes Yes Yes Yes No Yes  Type of Paramedic of [place];Living will [place];Living will [place];Living will [place];Living will [place];Living will  [place];Living will  Does patient want to make changes to medical advance directive?    No - Patient declined  No - Patient declined No - Patient declined  Copy of [place] in Chart? Yes - validated most recent copy scanned in chart (See row information) Yes - validated most recent copy scanned in chart (See row information) No - copy requested Yes - validated most recent copy scanned in chart (See row information) Yes - validated most recent copy scanned in chart (See row information)  Yes - validated most recent copy scanned in chart (See row information)    Current Medications  (verified) Outpatient Encounter Medications as of 02/23/2022  Medication Sig   allopurinol (ZYLOPRIM) 100 MG tablet Take 1 tablet by mouth once daily   amLODipine (NORVASC) 10 MG tablet Take 1 tablet by mouth once daily   ezetimibe (ZETIA) 10 MG tablet Take 1 tablet (10 mg total) by mouth daily.   fluticasone (FLONASE) 50 MCG/ACT nasal spray USE 1 TO 2 SPRAYS IN EACH NOSTRIL AS NEEDED FOR TREATMENT OF ALLERGIES   levothyroxine (SYNTHROID) 112 MCG tablet Take 1 tablet by mouth once daily   metoprolol succinate (TOPROL-XL) 50 MG 24 hr tablet TAKE 3 TABLETS BY MOUTH ONCE DAILY WITH MEALS   Multiple Vitamins-Minerals (CENTRUM PO) Take by mouth.   omeprazole (PRILOSEC) 40 MG capsule Take 1 capsule by mouth once daily   potassium chloride (KLOR-CON) 10 MEQ tablet Take 1 tablet by mouth once daily   rosuvastatin (CRESTOR) 40 MG tablet Take 1 tablet (40 mg total) by mouth daily.   spironolactone (ALDACTONE) 25 MG tablet Take 3 tablets by mouth once daily   [DISCONTINUED] esomeprazole (NEXIUM) 20 MG capsule Take 2 capsules (40 mg total) by mouth daily before breakfast.   No facility-administered encounter medications on file as of 02/23/2022.    Allergies (verified) Shrimp (diagnostic) and Hctz [hydrochlorothiazide]   History: Past Medical History:  Diagnosis Date   Anemia    Back pain  Endometrial cancer [facility])    Endometrial polyp    GERD (gastroesophageal reflux disease)    Gout    Heart murmur    Heart palpitations    History of blood transfusion    History of chemotherapy    History of radiation therapy 09/02/2015-10/24/2015   Endometrium 10/15/15- 10/24/15 HDR Treatment, 09/02/15-10/08/15 50 Gy pelvic radiation; Dr. Gery Pray   History of uterine fibroid    Hyperlipidemia    Hypertension    Hypothyroidism    IBS (irritable bowel syndrome)    Joint pain    OSA (obstructive sleep apnea)    moderate OSA per study 06-21-2005--  per pt does need cpap anymore never purchased it    Osteoarthritis    Osteoporosis 02/23/2022   Patient has confirmed diagnosis of osteoporosis with DEXA scan done a few months ago.  Alendronate was sent in, however patient did not receive it.  Could be secondary to older age, as well as cancer treatment years ago.  Also, vitamin D levels at low normal at 34.  Given osteoporosis confirmed, will also supplement with vitamin D.  Will patient is agreeable to starting alendronate, and have expla   Other fatigue    Other specified disorders of thyroid    PMB (postmenopausal bleeding)    Shortness of breath on exertion    Wears dentures    upper   Wears glasses    Past Surgical History:  Procedure Laterality Date   ABDOMINAL HYSTERECTOMY     BREAST EXCISIONAL BIOPSY Right    benign   CHOLECYSTECTOMY     CHOLECYSTECTOMY, LAPAROSCOPIC  2020   COLONOSCOPY     DILATATION & CURETTAGE/HYSTEROSCOPY WITH MYOSURE N/A 03/07/2015   Procedure: DILATATION & CURETTAGE/HYSTEROSCOPY WITH MYOSURE (POLYP);  Surgeon: Arvella Nigh, MD;  [facility];  Service: Gynecology;  Laterality: N/A;   DILATATION & CURRETTAGE/HYSTEROSCOPY WITH RESECTOCOPE  12-13-2002  &  09-26-2004   polyp/  submucosal fibroid   ESOPHAGOGASTRODUODENOSCOPY     EXCISION BENIGN RIGHT BREAST MASS  age 18   IR REMOVAL TUN ACCESS W/ PORT W/O FL MOD SED  07/17/2016   KNEE ARTHROSCOPY W/ MENISCECTOMY Bilateral right 04-12-2006//  left 12-29-2006   and Chondroplasty   port placement     ROBOTIC ASSISTED TOTAL HYSTERECTOMY WITH BILATERAL SALPINGO OOPHERECTOMY Bilateral 05/02/2015   Procedure: XI ROBOTIC ASSISTED TOTAL HYSTERECTOMY WITH BILATERAL SALPINGO OOPHORECTOMY AND SENTINAL LYMPH NODE BIOPSY;  Surgeon: Janie Morning, MD;  Location: [facility];  Service: Gynecology;  Laterality: Bilateral;   TRANSTHORACIC ECHOCARDIOGRAM  04-02-2010   normal LV, ef 65%   TUBAL LIGATION  50's   Family History  Problem Relation Age of Onset   Hypertension Mother    Heart failure Mother     Sudden death Mother    Stroke Mother    Cirrhosis Father        alcoholic   Liver disease Father    Alcoholism Father    Heart disease Son    Hypertension Son    Colon cancer Neg Hx    Stomach cancer Neg Hx    Rectal cancer Neg Hx    Esophageal cancer Neg Hx    Liver cancer Neg Hx    Social History   Socioeconomic History   Marital status: Widowed    Spouse name: Not on file   Number of children: 3   Years of education: Not on file   Highest education level: 12th grade  Occupational History   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 There are not steps up to the entrance the patient uses.       Patient's method of transportation is personal car.      The highest level of education was high school diploma.      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      Identified important Relationships are "my kids"       Pets : poodle       Interests / Fun: "I love cooking"      Current Stressors: "none"       Religious / Personal Beliefs: "Holiness"    Social Determinants of Health   Financial Resource Strain: High Risk (02/23/2022)   Overall Financial Resource Strain (CARDIA)    Difficulty of Paying Living Expenses: Very hard  Food Insecurity: Food Insecurity Present (02/23/2022)   Hunger Vital Sign    Worried About Running Out of Food in the Last Year: Sometimes true    Ran Out of Food in the  Last Year: Never true  Transportation Needs: No Transportation Needs (02/23/2022)   PRAPARE - Hydrologist (Medical): No    Lack of Transportation (Non-Medical): No  Physical Activity: Sufficiently Active (02/23/2022)   Exercise Vital Sign    Days of Exercise per Week: 5 days    Minutes of Exercise per Session: 30 min  Stress: Stress Concern Present (02/23/2022)   [employer]    Feeling of Stress : Very much  Social Connections: Moderately Isolated (02/23/2022)   Social Connection and Isolation Panel [NHANES]    Frequency of Communication with Friends and Family: Three times a week    Frequency of Social Gatherings with Friends and Family: Never    Attends Religious Services: More than 4 times per year    Active Member of Clubs or Organizations: No    Attends Archivist Meetings: Never    Marital Status: Widowed    Tobacco Counseling Counseling given: Not Answered   Clinical Intake:  Pre-visit preparation completed: Yes  Pain : 0-10 Pain Score: 7  Pain Type: Chronic pain Pain Location: Knee Pain Orientation: Right, Left Pain Descriptors / Indicators: Aching, Constant Pain Onset: More than a month ago Pain Frequency: Constant Effect of Pain on Daily Activities: pain when walking     Nutritional Risks: None Diabetes: No  How often do you need to have someone help you when you read instructions, pamphlets, or other written materials from your doctor or pharmacy?: 2 - Rarely What is the last grade level you completed in school?: 12th grade  Diabetic?No  Interpreter Needed?: No  Information entered by :: Nelani Schmelzle,cma 02/23/22 4:04pm   Activities of Daily Living    02/23/2022    4:04 PM  02/23/2022    8:37 AM  In your present state of health, do you have any difficulty performing the following activities:  Hearing? 0 0  Vision? 0 0  Difficulty concentrating or  making decisions? 0 0  Walking or climbing stairs? 1 1  Comment  tires pain  Dressing or bathing? 0 0  Doing errands, shopping? 0 0    Patient Care Team: Drucie Opitz, MD as PCP - General Plyler, Chauncey Reading, RD as Dietitian (Dietician) Shea Evans, Norva Riffle, LCSW as [place] (Licensed Clinical Social Worker)  Indicate any recent [place] you may have received from other than [facility] providers in the past year (date may be approximate).     Assessment:   This is a routine wellness examination for [place].  Hearing/Vision screen No results found.  Dietary issues and exercise activities discussed:     Goals Addressed   None   Depression Screen    02/23/2022    4:04 PM 10/02/2021    8:36 AM 07/08/2021    8:20 AM 05/09/2021    9:18 AM 03/11/2021    9:27 AM 11/11/2020    8:59 AM 10/28/2020    8:41 AM  PHQ 2/9 Scores  PHQ - 2 Score 0 0 0 0 0 0 0  PHQ- 9 Score       0    Fall Risk    02/23/2022    4:04 PM 02/23/2022    8:35 AM 10/02/2021    8:36 AM 07/08/2021    8:19 AM 05/09/2021    9:13 AM  Fall Risk   Falls in the past year? 1 1 1 1 1  $ Number falls in past yr: 1 1 0 0 0  Injury with Fall? 0 0 0 1 0  Risk for fall due to : Impaired mobility;Impaired balance/gait;History of fall(s) History of fall(s);Impaired balance/gait;Impaired mobility Impaired balance/gait Impaired balance/gait History of fall(s);Impaired balance/gait  Risk for fall due to: Comment  legs got weak     Follow up Falls evaluation completed;Falls prevention discussed Falls evaluation completed;Falls prevention discussed Falls evaluation completed Falls evaluation completed;Falls prevention discussed Falls evaluation completed;Falls prevention discussed    FALL RISK PREVENTION PERTAINING TO THE HOME:  Any stairs in or around the home? No  If so, are there any without handrails? No  Home free of loose throw rugs in walkways, pet beds, electrical cords, etc? Yes  Adequate  lighting in your home to reduce risk of falls? Yes   ASSISTIVE DEVICES UTILIZED TO PREVENT FALLS:  Life alert? No  Use of a cane, walker or w/c? No  Grab bars in the bathroom? No  Shower chair or bench in shower? No  Elevated toilet seat or a handicapped toilet? No   TIMED UP AND GO:  Was the test performed? Yes .  Length of time to ambulate 10 feet: 1 min.   Gait slow and steady without use of assistive device  Cognitive Function:        02/23/2022    4:06 PM 02/23/2022    4:04 PM 11/11/2020    9:00 AM  6CIT Screen  What Year? 0 points 0 points 4 points  What month? 0 points 0 points 0 points  What time? 0 points 0 points 0 points  Count back from 20 0 points 0 points 0 points  Months in reverse 0 points 0 points 0 points  Repeat phrase 0 points 0 points 0 points  Total Score 0 points 0  points 4 points    Immunizations Immunization History  Administered Date(s) Administered   Influenza,inj,Quad PF,6+ Mos 10/28/2015   PFIZER(Purple Top)SARS-COV-2 Vaccination 10/03/2019, 10/24/2019    TDAP status: Due, Education has been provided regarding the importance of this vaccine. Advised may receive this vaccine at local pharmacy or Health Dept. Aware to provide a copy of the vaccination record if obtained from local pharmacy or Health Dept. Verbalized acceptance and understanding.  Flu Vaccine status: Due, Education has been provided regarding the importance of this vaccine. Advised may receive this vaccine at local pharmacy or Health Dept. Aware to provide a copy of the vaccination record if obtained from local pharmacy or Health Dept. Verbalized acceptance and understanding.  Pneumococcal vaccine status: Due, Education has been provided regarding the importance of this vaccine. Advised may receive this vaccine at local pharmacy or Health Dept. Aware to provide a copy of the vaccination record if obtained from local pharmacy or Health Dept. Verbalized acceptance and  understanding.  Covid-19 vaccine status: Completed vaccines  Qualifies for Shingles Vaccine? No   Zostavax completed No   Shingrix Completed?: No.    Education has been provided regarding the importance of this vaccine. Patient has been advised to call insurance company to determine out of pocket expense if they have not yet received this vaccine. Advised may also receive vaccine at local pharmacy or Health Dept. Verbalized acceptance and understanding.  Screening Tests Health Maintenance  Topic Date Due   DTaP/Tdap/Td (1 - Tdap) Never done   Zoster Vaccines- Shingrix (1 of 2) Never done   Pneumonia Vaccine 53+ Years old (1 of 1 - PCV) Never done   COVID-19 Vaccine (3 - Pfizer risk series) 11/21/2019   INFLUENZA VACCINE  08/12/2021   MAMMOGRAM  01/11/2023   Medicare Annual Wellness (AWV)  02/24/2023   COLONOSCOPY (Pts 45-32yr Insurance coverage will need to be confirmed)  04/30/2026   DEXA SCAN  Completed   Hepatitis C Screening  Completed   HPV VACCINES  Aged Out    Health Maintenance  Health Maintenance Due  Topic Date Due   DTaP/Tdap/Td (1 - Tdap) Never done   Zoster Vaccines- Shingrix (1 of 2) Never done   Pneumonia Vaccine 69 Years old (1 of 1 - PCV) Never done   COVID-19 Vaccine (3 - Pfizer risk series) 11/21/2019   INFLUENZA VACCINE  08/12/2021    Colorectal cancer screening: Type of screening: Colonoscopy. Completed 04/29/2016. Repeat every 10 years  Mammogram status: Completed 01/10/2021. Repeat every year:2    Lung Cancer Screening: (Low Dose CT Chest recommended if Age 69-80years, 30 pack-year currently smoking OR have quit w/in 15years.) does not qualify.   Lung Cancer Screening Referral: N/A  Additional Screening:  Hepatitis C Screening: does not qualify; Completed 10/23/2005  Vision Screening: Recommended annual ophthalmology exams for early detection of glaucoma and other disorders of the eye. Is the patient up to date with their annual eye exam?   Yes  Who is the provider or what is the name of the office in which the patient attends annual eye exams? Dr.Groat If pt is not established with a provider, would they like to be referred to a provider to establish care? No .   Dental Screening: Recommended annual dental exams for proper oral hygiene  Community Resource Referral / Chronic Care Management: CRR required this visit?  No   CCM required this visit?  No      Plan:     I have personally reviewed  and noted the following in the patient's chart:   Medical and social history Use of alcohol, tobacco or illicit drugs  Current medications and supplements including opioid prescriptions. Patient is not currently taking opioid prescriptions. Functional ability and status Nutritional status Physical activity Advanced directives List of other physicians Hospitalizations, surgeries, and ER visits in previous 12 months Vitals Screenings to include cognitive, depression, and falls Referrals and appointments  In addition, I have reviewed and discussed with patient certain preventive protocols, quality metrics, and best practice recommendations. A written personalized care plan for preventive services as well as general preventive health recommendations were provided to patient.     Kerin Perna, [facility]   02/23/2022   Nurse Notes: Face-To-Face Visit  Ms. Gendreau , Thank you for taking time to come for your Medicare Wellness Visit. I appreciate your ongoing commitment to your health goals. Please review the following plan we discussed and let me know if I can assist you in the future.   These are the goals we discussed:  Goals      Blood Pressure < 140/90     Exercise 7x per week (60 min per time)     "Walk my dog"     Increase physical activity to 30-60 minutes a day     LDL CALC < 130     Reduce calorie intake to 1500  calories per day     Reduce fat intake to 42 grams per day        This is a list of the screening  recommended for you and due dates:  Health Maintenance  Topic Date Due   DTaP/Tdap/Td vaccine (1 - Tdap) Never done   Zoster (Shingles) Vaccine (1 of 2) Never done   Pneumonia Vaccine (1 of 1 - PCV) Never done   COVID-19 Vaccine (3 - Pfizer risk series) 11/21/2019   Flu Shot  08/12/2021   Mammogram  01/11/2023   Medicare Annual Wellness Visit  02/24/2023   Colon Cancer Screening  04/30/2026   DEXA scan (bone density measurement)  Completed   Hepatitis C Screening: USPSTF Recommendation to screen - Ages 8-79 yo.  Completed   HPV Vaccine  Aged Out

## 2022-02-23 NOTE — Addendum Note (Signed)
Addended byDrucie Opitz on: 02/23/2022 01:56 PM   Modules accepted: Orders

## 2022-02-23 NOTE — Assessment & Plan Note (Signed)
Patient applied for disability, however at that time was denied.  Patient has severe limitations in ambulation given her chronic osteoarthritis of her knees that is bilateral.  Believe she would benefit greatly from disability, as day-to-day activities are very difficult for her to perform.  Patient qualifies for [facility], so we will place referral for social work to assist her.  Plan: - Social work referral

## 2022-02-23 NOTE — Assessment & Plan Note (Signed)
Patient's blood pressure today is 122/53.  Her current regimen is amlodipine 10 mg, metoprolol 50 mg, spironolactone 25 mg.  She is compliant with her regimen and takes her medications daily.  She denies any side effects from these medications.  Will continue.  Plan: - Continue medical regimen

## 2022-02-23 NOTE — Assessment & Plan Note (Signed)
Patient with a history of known osteoarthritis and knees bilaterally.  She states it is not gotten any better or worse, and she has takes Tylenol for the pain which seems to control it.  She has not seen a physical therapist in quite a few years, however she states when she was seen 1 it was helping her significantly.  On physical exam, range of motion is very limited by pain.  And patient walks hunched over in pain, as ambulation is very difficult for her.  This is causing significant distress in her life because she is not able to perform efficiently at work.  I believe she would benefit greatly from a walker.  Plan: - Rolling walker - Continue Tylenol for pain management - Physical therapy referral

## 2022-02-23 NOTE — Patient Instructions (Signed)
It was a pleasure seeing you in the clinic today.  Your blood pressures look good, keep taking your medications.  For your pain, I have sent in a referral to physical therapy and I will also place an order for a walker for you.  Please continue taking Tylenol as needed.  For the fatigue, I have sent in some vitamin D pills and also a medication called Fosamax.  Please remember to take Fosamax once a week, 30 minutes before you have coffee or eat anything and remember to sit up as well for 30 minutes after you have the Fosamax.  If you start developing severe pain in your jaw, please come to the emergency department and stop the medication.  Someone from social work should be reaching out to you as well.

## 2022-02-23 NOTE — Progress Notes (Signed)
  Care Coordination   Note   02/23/2022 Name: Lydia Walsh MRN: [id number] DOB: 1955/04/24  Lydia Walsh is a 67 y.o. year old female who sees Nooruddin, Marlene Lard, MD for primary care. I reached out to [name] by phone today to offer care coordination services.  [name] was given information about Care Coordination services today including:   The Care Coordination services include support from the care team which includes your Nurse Coordinator, Clinical Social Worker, or Pharmacist.  The Care Coordination team is here to help remove barriers to the health concerns and goals most important to you. Care Coordination services are voluntary, and the patient may decline or stop services at any time by request to their care team member.   Care Coordination Consent Status: Patient agreed to services and verbal consent obtained.   Follow up plan:  Telephone appointment with care coordination team member scheduled for:  03/02/22  Encounter Outcome:  Pt. Scheduled  [place]  Direct Dial: [phone]

## 2022-02-23 NOTE — Addendum Note (Signed)
Addended byDrucie Opitz on: 02/23/2022 12:46 PM   Modules accepted: Orders

## 2022-02-23 NOTE — Progress Notes (Signed)
CC: Knee Pain/ Blood Pressure check  HPI:  [name] is a 67 y.o. female living with a history stated below and presents today for bilateral knee pain. Please see problem based assessment and plan for additional details.  Past Medical History:  Diagnosis Date   Anemia    Back pain    Endometrial cancer (HCC)    Endometrial polyp    GERD (gastroesophageal reflux disease)    Gout    Heart murmur    Heart palpitations    History of blood transfusion    History of chemotherapy    History of radiation therapy 09/02/2015-10/24/2015   Endometrium 10/15/15- 10/24/15 HDR Treatment, 09/02/15-10/08/15 50 Gy pelvic radiation; Dr. Gery Pray   History of uterine fibroid    Hyperlipidemia    Hypertension    Hypothyroidism    IBS (irritable bowel syndrome)    Joint pain    OSA (obstructive sleep apnea)    moderate OSA per study 06-21-2005--  per pt does need cpap anymore never purchased it   Osteoarthritis    Osteoporosis 02/23/2022   Patient has confirmed diagnosis of osteoporosis with DEXA scan done a few months ago.  Alendronate was sent in, however patient did not receive it.  Could be secondary to older age, as well as cancer treatment years ago.  Also, vitamin D levels at low normal at 34.  Given osteoporosis confirmed, will also supplement with vitamin D.  Will patient is agreeable to starting alendronate, and have expla   Other fatigue    Other specified disorders of thyroid    PMB (postmenopausal bleeding)    Shortness of breath on exertion    Wears dentures    upper   Wears glasses     Current Outpatient Medications on File Prior to Visit  Medication Sig Dispense Refill   allopurinol (ZYLOPRIM) 100 MG tablet Take 1 tablet by mouth once daily 90 tablet 0   amLODipine (NORVASC) 10 MG tablet Take 1 tablet by mouth once daily 90 tablet 0   ezetimibe (ZETIA) 10 MG tablet Take 1 tablet (10 mg total) by mouth daily. 90 tablet 3   fluticasone (FLONASE) 50 MCG/ACT nasal  spray USE 1 TO 2 SPRAYS IN EACH NOSTRIL AS NEEDED FOR TREATMENT OF ALLERGIES 16 g 0   levothyroxine (SYNTHROID) 112 MCG tablet Take 1 tablet by mouth once daily 30 tablet 0   metoprolol succinate (TOPROL-XL) 50 MG 24 hr tablet TAKE 3 TABLETS BY MOUTH ONCE DAILY WITH MEALS 270 tablet 3   Multiple Vitamins-Minerals (CENTRUM PO) Take by mouth.     omeprazole (PRILOSEC) 40 MG capsule Take 1 capsule by mouth once daily 90 capsule 0   potassium chloride (KLOR-CON) 10 MEQ tablet Take 1 tablet by mouth once daily 90 tablet 0   rosuvastatin (CRESTOR) 40 MG tablet Take 1 tablet (40 mg total) by mouth daily. 90 tablet 1   spironolactone (ALDACTONE) 25 MG tablet Take 3 tablets by mouth once daily 270 tablet 0   [DISCONTINUED] esomeprazole (NEXIUM) 20 MG capsule Take 2 capsules (40 mg total) by mouth daily before breakfast. 30 capsule 3   No current facility-administered medications on file prior to visit.    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 There are not steps up to the entrance the patient uses.       Patient's method of transportation is personal car.      The highest level of education was high school diploma.      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      Identified important Relationships are "my kids"       Pets : poodle       Interests / Fun: "I love cooking"      Current Stressors: "none"       Religious / Personal Beliefs: "Holiness"    Social Determinants of Health   Financial Resource Strain: Not on file  Food Insecurity: No Food Insecurity (02/23/2022)   Hunger Vital Sign    Worried About Running Out of Food in the Last Year: Never true    Ran Out of Food in the Last Year: Never true  Transportation Needs: No Transportation Needs (02/23/2022)   PRAPARE - Hydrologist (Medical): No    Lack of Transportation (Non-Medical): No  Physical Activity: Not on file  Stress: Not on file  Social Connections: Moderately Isolated (02/23/2022)   Social Connection and Isolation Panel [NHANES]    Frequency of Communication with Friends and Family: More than three times a week    Frequency of Social Gatherings with Friends and Family: More than three times a week    Attends Religious Services: More than 4 times per year    Active Member of [employer] or Organizations: No    Attends Archivist Meetings: Never    Marital Status: Widowed  Intimate Partner Violence: Not At Risk (02/23/2022)   Humiliation, Afraid, Rape, and Kick questionnaire    Fear of Current or Ex-Partner: No    Emotionally Abused: No    Physically Abused: No    Sexually Abused: No    Review of Systems: ROS negative except for what is noted on the assessment and plan.  Vitals:    02/23/22 0838  BP: (!) 122/53  Pulse: 66  Resp: (!) 28  Temp: 98.7 F (37.1 C)  TempSrc: Oral  SpO2: 100%  Weight: 182 lb (82.6 kg)  Height: 5' (1.524 m)    Physical Exam: Constitutional: Well-appearing woman in no acute distress Cardiovascular: regular rate and rhythm, no m/r/g Pulmonary/Chest: normal work of breathing on room air, lungs clear to auscultation bilaterally MSK: normal bulk and tone.  Significant tenderness to palpation on bilateral knees.  Range of motion significantly inhibited by pain   Assessment & Plan:   Primary osteoarthritis of knees, bilateral Patient with a history of known osteoarthritis and knees bilaterally.  She states it is not gotten any better or worse, and she has takes Tylenol for the pain which seems to control it.  She has not seen a physical therapist in quite a few years, however she states when she was seen 1 it was helping her significantly.  On physical exam, range of motion is very limited by pain.  And patient walks hunched over in pain, as ambulation is very difficult for her.  This is causing significant distress in her life because she is not able to perform efficiently at work.  I believe she would benefit greatly from a walker.  Plan: - Rolling walker - Continue Tylenol for pain management - Physical therapy referral  Osteoporosis Patient has confirmed diagnosis of osteoporosis with DEXA scan done a few months ago.  Alendronate was sent in, however patient did not receive it.  Could be secondary to older age, as well as cancer treatment years ago.  Also, vitamin D levels at low normal at 34.  Given osteoporosis confirmed, will also supplement with vitamin D.  Will patient is agreeable to starting alendronate, and have explained the side effects and how to properly take the medication.  Plan: - Alendronate 75 mg once a week - Vitamin D supplementation  Healthcare maintenance Patient applied for disability, however at that time was  denied.  Patient has severe limitations in ambulation given her chronic osteoarthritis of her knees that is bilateral.  Believe she would benefit greatly from disability, as day-to-day activities are very difficult for her to perform.  Patient qualifies for [facility], so we will place referral for social work to assist her.  Plan: - Social work referral    Essential hypertension Patient's blood pressure today is 122/53.  Her current regimen is amlodipine 10 mg, metoprolol 50 mg, spironolactone 25 mg.  She is compliant with her regimen and takes her medications daily.  She denies any side effects from these medications.  Will continue.  Plan: - Continue medical regimen    Patient discussed with Dr. Tobi Bastos Erin Uecker, M.D. [facility], PGY-1 Date 02/23/2022 Time 11:37 AM

## 2022-02-23 NOTE — Assessment & Plan Note (Signed)
Patient has confirmed diagnosis of osteoporosis with DEXA scan done a few months ago.  Alendronate was sent in, however patient did not receive it.  Could be secondary to older age, as well as cancer treatment years ago.  Also, vitamin D levels at low normal at 34.  Given osteoporosis confirmed, will also supplement with vitamin D.  Will patient is agreeable to starting alendronate, and have explained the side effects and how to properly take the medication.  Plan: - Alendronate 75 mg once a week - Vitamin D supplementation

## 2022-02-24 ENCOUNTER — Other Ambulatory Visit: Payer: Self-pay

## 2022-02-24 DIAGNOSIS — K219 Gastro-esophageal reflux disease without esophagitis: Secondary | ICD-10-CM

## 2022-02-24 MED ORDER — OMEPRAZOLE 40 MG PO CPDR: 40.0000 mg | DELAYED_RELEASE_CAPSULE | Freq: Every day | ORAL | 0 refills | Status: DC

## 2022-02-27 NOTE — Progress Notes (Signed)
Internal Medicine Clinic Attending  I reviewed the AWV findings.  I agree with the assessment, diagnosis, and plan of care documented in the AWV note.     

## 2022-02-27 NOTE — Progress Notes (Signed)
Internal Medicine Clinic Attending  Case discussed with Dr. Sanjuana Mae  At the time of the visit.  We reviewed the resident's history and exam and pertinent patient test results.  I agree with the assessment, diagnosis, and plan of care documented in the resident's note.

## 2022-03-02 ENCOUNTER — Telehealth: Payer: Self-pay

## 2022-03-02 ENCOUNTER — Ambulatory Visit: Payer: Self-pay | Admitting: Licensed Clinical Social Worker

## 2022-03-02 DIAGNOSIS — K219 Gastro-esophageal reflux disease without esophagitis: Secondary | ICD-10-CM

## 2022-03-02 DIAGNOSIS — E039 Hypothyroidism, unspecified: Secondary | ICD-10-CM

## 2022-03-02 NOTE — Patient Instructions (Signed)
Visit Information  Thank you for taking time to visit with me today. Please don't hesitate to contact me if I can be of assistance to you before our next scheduled telephone appointment.  Following are the goals we discussed today:    Our next appointment is by telephone on 03/17/22 at 10:30 AM   Please call the care guide team at [phone] if you need to cancel or reschedule your appointment.   If you are experiencing a Mental Health or [place] or need someone to talk to, please go to [facility] [phone])   Following is a copy of your full plan of care:   Interventions: LCSW spoke via phone today with client about client needs. Client spoke of walking challenges. She has a cane at her home and is waiting on delivery of rolling walker for her to use. She spoke of challenges in walking Client and LCSW spoke of housing needs of client. Client said rent is high where she resides and she is interested in looking for apartment where rent is lower cost per month. LCSW to send order to Care Guides to request Care Guide mail client a housing resources list. Client agreed to this plan Discussed medication procurement of client, food procurement of client. Discussed sleeping challenges. She has sleeping challenges Provided counseling support Discussed family support. She has support from her two sons who live nearby Discussed employment of client. 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 She has Medicare A and B coverage.  Reviewed pain issues. She spoke of leg pain and knee pain issues Client also agreed for RN from program to call her to discuss nursing needs. LCSW to call client on 03/17/22 at 10:30 AM to discuss client needs  Client appreciative of call today from LCSW  Ms. Stickles was given information about Care Management services by the embedded care coordination team  including:  Care Management services include personalized support from designated clinical staff supervised by her physician, including individualized plan of care and coordination with other care providers 24/7 contact phone numbers for assistance for urgent and routine care needs. The patient may stop CCM services at any time (effective at the end of the month) by phone call to the office staff.  Patient agreed to services and verbal consent obtained.   Norva Riffle.Shahida Schnackenberg MSW, [place] representative [facility] Care Management [phone]

## 2022-03-02 NOTE — Patient Outreach (Signed)
  Care Coordination   Initial Visit Note   03/02/2022 Name: Lydia Walsh MRN: [id number] DOB: 16-Sep-1955  Lydia Walsh is a 67 y.o. year old female who sees Nooruddin, Marlene Lard, MD for primary care. I spoke with  Lydia Walsh by phone today.  What matters to the patients health and wellness today?  Patient wants information on housing resources   Goals:   Interventions:  LCSW spoke via phone today with client about client needs. Client spoke of walking challenges. She has a cane at her home and is waiting on delivery of rolling walker for her to use. She spoke of challenges in walking Client and LCSW spoke of housing needs of client. Client said rent is high where she resides and she is interested in looking for apartment where rent is lower cost per month. LCSW to send order to Care Guides to request Care Guide mail client a housing resources list. Client agreed to this plan Discussed medication procurement of client, food procurement of client. Discussed sleeping challenges. She has sleeping challenges Provided counseling support Discussed family support. She has support from her two sons who live nearby Discussed employment of client. 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 She has Medicare A and B coverage.  Reviewed pain issues. She spoke of leg pain and knee pain issues Client also agreed for RN from program to call her to discuss nursing needs. LCSW to call client on 03/17/22 at 10:30 AM to discuss client needs  Client appreciative of call today from LCSW  SDOH assessments and interventions completed:  Yes     Care Coordination Interventions:  Yes, provided       Follow up plan: Follow up call scheduled for 03/17/22 at 10:30 AM    Encounter Outcome:  Pt. Visit Completed   Lydia Walsh.Lydia Walsh MSW, Lydia Walsh representative [facility] Care Management [phone]

## 2022-03-02 NOTE — Telephone Encounter (Signed)
   Telephone encounter was:  Successful.  03/02/2022 Name: Lydia Walsh MRN: [id number] DOB: Nov 16, 1955  Lydia Walsh is a 67 y.o. year old female who is a primary care patient of Nooruddin, Marlene Lard, MD . The community resource team was consulted for assistance with  housing.  Care guide performed the following interventions: Spoke with patient about [employer] but patient has applied for senior housing and was denied based on income. Patient gave consent to send [place] referral to [employer]. Verified email address to send information for [place]. Also sent [employer] and utility assistance.  Follow Up Plan:  Care guide will follow up with patient by phone over the next 7 days.  [place] Resource Care Guide   [email or web address]  ?? [email or web address]   Website: [email or web address]  [email or web address]

## 2022-03-02 NOTE — Addendum Note (Signed)
Addended byDrucie Opitz on: 03/02/2022 11:48 AM   Modules accepted: Orders

## 2022-03-03 ENCOUNTER — Telehealth: Payer: Self-pay

## 2022-03-03 NOTE — Telephone Encounter (Signed)
   Telephone encounter was:  Successful.  03/03/2022 Name: [name] MRN: [id number] DOB: 1955/03/28  Lydia Walsh is a 67 y.o. year old female who is a primary care patient of Nooruddin, Marlene Lard, MD . The community resource team was consulted for assistance with  housing.  Care guide performed the following interventions: Received the following message from [facility]: Outcome Notes TC To Ms. Kimoto. She Stated That She Needed A Scientist, water quality For 1 Bedroom Up To $700 In [place]. Sent Her An Insurance underwriter Of Landlord That Had Rental Properties In Her Price Range. Thanks! .  Follow Up Plan:  No further follow up planned at this time. The patient has been provided with needed resources.  [place] Resource Care Guide   [email or web address]  ?? [id number]   Website: [email or web address]  [email or web address]

## 2022-03-09 NOTE — Therapy (Incomplete)
OUTPATIENT PHYSICAL THERAPY LOWER EXTREMITY EVALUATION   Patient Name: Lydia Walsh MRN: [id number] DOB:05/16/1955, 67 y.o., female Today's Date: 03/09/2022  END OF SESSION:   Past Medical History:  Diagnosis Date   Anemia    Back pain    Endometrial cancer ([place])    Endometrial polyp    GERD (gastroesophageal reflux disease)    Gout    Heart murmur    Heart palpitations    History of blood transfusion    History of chemotherapy    History of radiation therapy 09/02/2015-10/24/2015   Endometrium 10/15/15- 10/24/15 HDR Treatment, 09/02/15-10/08/15 50 Gy pelvic radiation; Dr. Gery Pray   History of uterine fibroid    Hyperlipidemia    Hypertension    Hypothyroidism    IBS (irritable bowel syndrome)    Joint pain    OSA (obstructive sleep apnea)    moderate OSA per study 06-21-2005--  per pt does need cpap anymore never purchased it   Osteoarthritis    Osteoporosis 02/23/2022   Patient has confirmed diagnosis of osteoporosis with DEXA scan done a few months ago.  Alendronate was sent in, however patient did not receive it.  Could be secondary to older age, as well as cancer treatment years ago.  Also, vitamin D levels at low normal at 34.  Given osteoporosis confirmed, will also supplement with vitamin D.  Will patient is agreeable to starting alendronate, and have expla   Other fatigue    Other specified disorders of thyroid    PMB (postmenopausal bleeding)    Shortness of breath on exertion    Wears dentures    upper   Wears glasses    Past Surgical History:  Procedure Laterality Date   ABDOMINAL HYSTERECTOMY     BREAST EXCISIONAL BIOPSY Right    benign   CHOLECYSTECTOMY     CHOLECYSTECTOMY, LAPAROSCOPIC  2020   COLONOSCOPY     DILATATION & CURETTAGE/HYSTEROSCOPY WITH MYOSURE N/A 03/07/2015   Procedure: DILATATION & CURETTAGE/HYSTEROSCOPY WITH MYOSURE (POLYP);  Surgeon: Arvella Nigh, MD;  Location: [facility];  Service: Gynecology;  Laterality: N/A;    DILATATION & CURRETTAGE/HYSTEROSCOPY WITH RESECTOCOPE  12-13-2002  &  09-26-2004   polyp/  submucosal fibroid   ESOPHAGOGASTRODUODENOSCOPY     EXCISION BENIGN RIGHT BREAST MASS  age 27   IR REMOVAL TUN ACCESS W/ PORT W/O FL MOD SED  07/17/2016   KNEE ARTHROSCOPY W/ MENISCECTOMY Bilateral right 04-12-2006//  left 12-29-2006   and Chondroplasty   port placement     ROBOTIC ASSISTED TOTAL HYSTERECTOMY WITH BILATERAL SALPINGO OOPHERECTOMY Bilateral 05/02/2015   Procedure: XI ROBOTIC ASSISTED TOTAL HYSTERECTOMY WITH BILATERAL SALPINGO OOPHORECTOMY AND SENTINAL LYMPH NODE BIOPSY;  Surgeon: Janie Morning, MD;  Location: [facility];  Service: Gynecology;  Laterality: Bilateral;   TRANSTHORACIC ECHOCARDIOGRAM  04-02-2010   normal LV, ef 65%   TUBAL LIGATION  1980's   Patient Active Problem List   Diagnosis Date Noted   Osteoporosis 02/23/2022   Morbid obesity ([place]) 05/28/2020   Elevated coronary artery calcium score 09/09/2018   Primary osteoarthritis of knees, bilateral 11/18/2016   AV block, 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   Restless leg syndrome 06/01/2016   Hypokalemia 07/24/2015   International Federation 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 ([place]) 07/10/2015   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[place]) 07/10/2015   Renal mass, left 06/21/2015   Nephrolithiasis 06/21/2015   Obesity (BMI 30-39.9) 06/21/2015   Iron deficiency anemia due to chronic blood loss 06/21/2015  Endometrial cancer ([place]) 03/29/2015   Major depression 06/02/2013   Fatigue 05/02/2013   Healthcare maintenance 08/19/2012   Osteoarthritis    LOW BACK PAIN, CHRONIC 02/27/2008   GOUT 11/24/2007   ALLERGIC RHINITIS, SEASONAL 03/09/2006   G E R D 02/19/2006   Hypothyroidism 02/17/2006   Dyslipidemia 02/17/2006   Essential hypertension 02/17/2006   OBSTRUCTIVE SLEEP APNEA 06/21/2005    PCP: Drucie Opitz, MD  REFERRING PROVIDER: Lucious Groves, DO   REFERRING DIAG: M17.0  (ICD-10-CM) - Primary osteoarthritis of both knees   THERAPY DIAG:  No diagnosis found.  Rationale for Evaluation and Treatment: Rehabilitation  ONSET DATE: Chronic  SUBJECTIVE:   SUBJECTIVE STATEMENT: ***  PERTINENT HISTORY: HTN, Osteoporosis  PAIN:  Are you having pain?  Yes: NPRS scale: ***/10 Pain location: *** Pain description: *** Aggravating factors: *** Relieving factors: ***  PRECAUTIONS: {Therapy precautions:24002}  WEIGHT BEARING RESTRICTIONS: {Yes ***/No:24003}  FALLS:  Has patient fallen in last 6 months? {fallsyesno:27318}  LIVING ENVIRONMENT: Lives with: {[facility] lives with:25569::"lives with their family"} Lives in: {Lives in:25570} Stairs: {opstairs:27293} Has following equipment at home: {Assistive devices:23999}  OCCUPATION: ***  PLOF: {PLOF:24004}  PATIENT GOALS: ***  NEXT MD VISIT: ***  OBJECTIVE:   DIAGNOSTIC FINDINGS: ***  PATIENT SURVEYS:  {rehab surveys:24030}  COGNITION: Overall cognitive status: {cognition:24006}     SENSATION: {sensation:27233}  EDEMA:  {edema:24020}  MUSCLE LENGTH: Hamstrings: Right *** deg; Left *** deg Thomas test: Right *** deg; Left *** deg  POSTURE: {posture:25561}  PALPATION: ***  LOWER EXTREMITY ROM:  {AROM/PROM:27142} ROM Right eval Left eval  Hip flexion    Hip extension    Hip abduction    Hip adduction    Hip internal rotation    Hip external rotation    Knee flexion    Knee extension    Ankle dorsiflexion    Ankle plantarflexion    Ankle inversion    Ankle eversion     (Blank rows = not tested)  LOWER EXTREMITY MMT:  MMT Right eval Left eval  Hip flexion    Hip extension    Hip abduction    Hip adduction    Hip internal rotation    Hip external rotation    Knee flexion    Knee extension    Ankle dorsiflexion    Ankle plantarflexion    Ankle inversion    Ankle eversion     (Blank rows = not tested)  LOWER EXTREMITY SPECIAL TESTS:   {LEspecialtests:26242}  FUNCTIONAL TESTS:  {Functional tests:24029}  GAIT: Distance walked: *** Assistive device utilized: {Assistive devices:23999} Level of assistance: {Levels of assistance:24026} Comments: ***  TREATMENT: [facility] Adult PT Treatment:                                                DATE: 03/10/2022 Therapeutic Exercise: ***  PATIENT EDUCATION:  Education details: *** Person educated: {Person educated:25204} Education method: {Education Method:25205} Education comprehension: {Education Comprehension:25206}  HOME EXERCISE PROGRAM: ***  ASSESSMENT:  CLINICAL IMPRESSION: Patient is a *** y.o. *** who was seen today for physical therapy evaluation and treatment for ***.   OBJECTIVE IMPAIRMENTS: {opptimpairments:25111}.   ACTIVITY LIMITATIONS: {activitylimitations:27494}  PARTICIPATION LIMITATIONS: {participationrestrictions:25113}  PERSONAL FACTORS: {Personal factors:25162} are also affecting patient's functional outcome.   REHAB POTENTIAL: {rehabpotential:25112}  CLINICAL DECISION MAKING: {clinical decision making:25114}  EVALUATION COMPLEXITY: {Evaluation complexity:25115}  GOALS: Goals reviewed with patient? {yes/no:20286}  SHORT TERM GOALS: Target date: *** *** Baseline: Goal status: {GOALSTATUS:25110}  2.  *** Baseline:  Goal status: {GOALSTATUS:25110}  LONG TERM GOALS: Target date: ***  *** Baseline:  Goal status: {GOALSTATUS:25110}  2.  *** Baseline:  Goal status: {GOALSTATUS:25110}  3.  *** Baseline:  Goal status: {GOALSTATUS:25110}  4.  *** Baseline:  Goal status: {GOALSTATUS:25110}  5.  *** Baseline:  Goal status: {GOALSTATUS:25110}  6.  *** Baseline:  Goal status: {GOALSTATUS:25110}   PLAN:  PT FREQUENCY: {rehab frequency:25116}  PT DURATION: {rehab duration:25117}  PLANNED INTERVENTIONS: {rehab planned interventions:25118::"Therapeutic exercises","Therapeutic activity","Neuromuscular  re-education","Balance training","Gait training","Patient/Family education","Self Care","Joint mobilization"}  PLAN FOR NEXT SESSION: Ward Chatters, PT 03/09/2022, 10:09 AM

## 2022-03-10 ENCOUNTER — Ambulatory Visit: Payer: Medicare Other

## 2022-03-17 ENCOUNTER — Ambulatory Visit: Payer: Self-pay | Admitting: Licensed Clinical Social Worker

## 2022-03-17 NOTE — Patient Instructions (Addendum)
Visit Information  Thank you for taking time to visit with me today. Please don't hesitate to contact me if I can be of assistance to you before our next scheduled telephone appointment.  Following are the goals we discussed today:    Our next appointment is by telephone on 04/07/22 at 9:30 AM  Please call the care guide team at [phone] if you need to cancel or reschedule your appointment.   If you are experiencing a Mental Health or [place] or need someone to talk to, please go to [facility] [place] [phone])   Following is a copy of your full plan of care:   Interventions: LCSW spoke via phone today with client about client needs. Client spoke of walking challenges. She has a cane at her home she uses. She also recently received a rolling walker to use. She said rolling walker is very helpful to her. Client and LCSW spoke of housing needs of client. CMA mailed client list of local housing complexes she might consider.. Client said she has looked at 2 apartment complexes and has put her name on waiting list for 2 apartment complexes. CMA also send client list of food pantries in area. Client said her sons help her obtain needed grocery items. Provided counseling support Discussed financial needs. She receives Social Security benefit monthly. LCSW encouraged Gena to call [facility] DSS to speak with adult medicaid caseworker about medicaid eligibility . Discussed family support. She has support from her two sons who live nearby Discussed employment of client. 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 She has Medicare A and B coverage. She said she is considering other insurance . Discussion of other insurance carriers such as Humana, BCBS, [place] Reviewed pain issues. She spoke of leg pain and knee pain issues Client spoke of loose teeth due to recent  fall. LCSW suggested she call [facility] or [facility] to discuss her insurance and to see if she wanted to set up dental exam appointment or not. Discussed care with PCP Encouraged client to call SW as needed for SW support at [phone].   Ms. Tschoepe was given information about Care Management services by the embedded care coordination team including:  Care Management services include personalized support from designated clinical staff supervised by her physician, including individualized plan of care and coordination with other care providers 24/7 contact phone numbers for assistance for urgent and routine care needs. The patient may stop CCM services at any time (effective at the end of the month) by phone call to the office staff.  Patient agreed to services and verbal consent obtained.   Norva Riffle.Baby Stairs MSW, [place] [employer] representative [facility] Care Management [phone]

## 2022-03-17 NOTE — Patient Outreach (Signed)
Care Coordination   Follow Up Visit Note   03/17/2022 Name: Lydia Walsh MRN: [id number] DOB: 01/05/1956  Lydia Walsh is a 67 y.o. year old female who sees Nooruddin, Marlene Lard, MD for primary care. I spoke with  [name] by phone today.  What matters to the patients health and wellness today?  Patient needs information on housing resources    Goals Addressed               This Visit's Progress     Patient Stated she needed information on housing resources (pt-stated)        Interventions: LCSW spoke via phone today with client about client needs. Client spoke of walking challenges. She has a cane at her home she uses. She also recently received a rolling walker to use. She said rolling walker is very helpful to her. Client and LCSW spoke of housing needs of client. CMA mailed client list of local housing complexes she might consider.. Client said she has looked at 2 apartment complexes and has put her name on waiting list for 2 apartment complexes. CMA also send client list of food pantries in area. Client said her sons help her obtain needed grocery items. Provided counseling support Discussed financial needs. She receives Social Security benefit monthly. LCSW encouraged Lydia Walsh to call [facility] DSS to speak with adult medicaid caseworker about medicaid eligibility . Discussed family support. She has support from her two sons who live nearby Discussed employment of client. 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 She has Medicare A and B coverage. She said she is considering other insurance . Discussion of other insurance carriers such as Humana, BCBS, [place] Reviewed pain issues. She spoke of leg pain and knee pain issues Client spoke of loose teeth due to recent fall. LCSW suggested she call [facility] or [facility] to discuss her insurance and to see if she wanted to set up dental exam appointment or  not. Discussed care with PCP Encouraged client to call SW as needed for SW support at [phone].      SDOH assessments and interventions completed:  Yes  SDOH Interventions Today    Flowsheet Row Most Recent Value  SDOH Interventions   Financial Strain Interventions Other (Comment)  [client has financial strain]  Physical Activity Interventions Other (Comments)  [walking challenges]  Stress Interventions Provide Counseling  [client has stress related to walking issues. she has stress related to dental issues]        Care Coordination Interventions:  Yes, provided    Interventions Today    Flowsheet Row Most Recent Value  Chronic Disease   Chronic disease during today's visit Other  [discussed medical needs,  discussed dental needs]  General Interventions   General Interventions Discussed/Reviewed General Interventions Discussed, Community Resources  [informed client of [facility],  informed client of [facility]]  Exercise Interventions   Exercise Discussed/Reviewed Physical Activity  [client uses a cane or walker to help her walk]  Education Interventions   Education Provided Provided Education  [discussed Medicaid application process. Encouraged client to call [place] to talk with Medicaid caseworker]  Provided Verbal Education On [place] Discussed/Reviewed Anxiety  [discussed financial needs of client]  Safety Interventions   Safety Discussed/Reviewed Fall Risk       Follow up plan: Follow up call scheduled for 04/07/22 at 9:30 AM  Encounter Outcome:  Pt. Visit Completed   Norva Riffle.Xariah Silvernail MSW, [place] [employer] representative [facility] Care Management [phone]

## 2022-03-19 ENCOUNTER — Telehealth: Payer: Self-pay

## 2022-03-19 ENCOUNTER — Encounter: Payer: Self-pay | Admitting: Student

## 2022-03-19 ENCOUNTER — Other Ambulatory Visit: Payer: Self-pay | Admitting: Student

## 2022-03-19 NOTE — Telephone Encounter (Signed)
Requesting a letter for emotional animal support. Please call pt back.

## 2022-03-19 NOTE — Telephone Encounter (Signed)
Pt called / informed letter is ready for her to pick up in the am.

## 2022-03-19 NOTE — Patient Outreach (Signed)
  Care Coordination   03/19/2022 Name: Lydia Walsh MRN: [id number] DOB: 01-24-1955   Care Coordination Outreach Attempts:  An unsuccessful telephone outreach was attempted today to offer the patient information about available care coordination services as a benefit of their health plan.   Follow Up Plan:  Additional outreach attempts will be made to offer the patient care coordination information and services.   Encounter Outcome:  No Answer   Care Coordination Interventions:  No, not indicated  {THN Tip this will not be part of the note when signed-REQUIRED REPORT FIELD DO NOT DE  Lazaro Arms RN, BSN, [place] [facility]   Phone: [phone]

## 2022-03-19 NOTE — Telephone Encounter (Signed)
Return pt's call who stated she has an emotional support dog and she will be moving to an new apartment. She needs a letter from her doctor stating why she needs her dog . Stated she has anxiety and lives alone and her dog keeps her company. I told her the office will call her when the letter is ready. 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 Thanks

## 2022-03-23 ENCOUNTER — Ambulatory Visit: Payer: Medicare Other

## 2022-04-02 ENCOUNTER — Ambulatory Visit: Payer: Self-pay

## 2022-04-02 NOTE — Patient Outreach (Signed)
  Care Coordination   Initial Visit Note   04/02/2022 Name: Lydia Walsh MRN: [id number] DOB: 02-09-1955  Lydia Walsh is a 67 y.o. year old female who sees Lydia Walsh, Lydia Lard, Lydia Walsh for primary care. I spoke with  Lydia Walsh by phone today.  What matters to the patients health and wellness today?  I talked to Lydia Walsh, and she informed me that she doesn't require any assistance from me currently. Her physician is taking care of her needs. She did ask me about calling Lydia Walsh, and I suggested that she should explore other benefits she may have.    Goals Addressed             This Visit's Progress    COMPLETED: Care Coordination Activites- no follow up        Care Coordination Interventions: Active listening / Reflection utilized  Emotional Support Provided Problem Lydia Walsh strategies reviewed Discussed/.Educated Care Coordination Program 2.   Discussed/.Educated Annual Wellness Visit 3.   Discussed/.Educated Social Determinates of Health 4.   Please inform PCP if services needed in the future I also the patient to call Lydia Walsh to follow up and see if she has Lydia Walsh.          SDOH assessments and interventions completed:  No     Care Coordination Interventions:  Yes, provided   Interventions Today    Flowsheet Row Most Recent Value  Chronic Disease   Chronic disease during today's visit Other  [Discussed general needs]  General Interventions   General Interventions Discussed/Reviewed General Interventions Discussed, General Interventions Reviewed  Education Interventions   Education Provided Provided Education  Safety Interventions   Safety Discussed/Reviewed Safety Discussed, Safety Reviewed, Home Safety       Follow up plan: No further intervention required.   Encounter Outcome:  Pt. Visit Completed   Lydia Arms RN, BSN, [place] [facility]   Phone: [phone]

## 2022-04-02 NOTE — Patient Instructions (Signed)
Visit Information  Thank you for taking time to visit with me today. Please don't hesitate to contact me if I can be of assistance to you.   Following are the goals we discussed today:   Goals Addressed             This Visit's Progress    COMPLETED: Care Coordination Activites- no follow up        Care Coordination Interventions: Active listening / Reflection utilized  Emotional Support Provided Problem [place] strategies reviewed Discussed/.Educated Care Coordination Program 2.   Discussed/.Educated Annual Wellness Visit 3.   Discussed/.Educated Social Determinates of Health 4.   Please inform PCP if services needed in the future I also the patient to call DSS to follow up and see if she has Medicaid.          O If you are experiencing a Mental Health or [place] or need someone to talk to, please call 1-800-273-TALK (toll free, 24 hour hotline)  Patient verbalizes understanding of instructions and care plan provided today and agrees to view in [place]. Active MyChart status and patient understanding of how to access instructions and care plan via MyChart confirmed with patient.     Lazaro Arms RN, BSN, [place] [facility]   Phone: [phone]

## 2022-04-07 ENCOUNTER — Ambulatory Visit: Payer: Self-pay | Admitting: Licensed Clinical Social Worker

## 2022-04-07 NOTE — Patient Outreach (Signed)
  Care Coordination   Follow Up Visit Note   04/07/2022 Name: Lydia Walsh MRN: [id number] DOB: [birth year]  Lydia Walsh is a 67 y.o. year old female who sees Lydia Walsh, Lydia Lard, MD for primary care. I spoke with  [name] by phone today.  What matters to the patients health and wellness today? Client had requested information on housing resources    Goals Addressed               This Visit's Progress     Patient Stated she needed information on housing resources (pt-stated)        Interventions:  LCSW spoke with [name] via phone today. She said she received housing resources list from [facility] and received food pantry resources list from [place]. Her name is on waiting list for several apartment complexes Discussed Meals on Wheels . Encouraged client to call [facility] to talk with representative about adding her name to Meals on Wheels waiting list. She said she would call that agency to add her name to Meals on Wheels waiting list Discussed decreased sleep. She spoke of knee pain and leg pain. She said she has appointment with Orthopedist on April 09, 2022 Discussed family support from her 2 sons.  Discussed transport needs. She drives herself to and from her appointments Provided counseling support Encouraged client to call LCSW for SW needs at [phone].        SDOH assessments and interventions completed:  Yes  SDOH Interventions Today    Flowsheet Row Most Recent Value  SDOH Interventions   Physical Activity Interventions Other (Comments)  [uses a cane or a walker to help her walk]  Stress Interventions Provide Counseling  [has stress related to managing medical needs]        Care Coordination Interventions:  Yes, provided   Interventions Today    Flowsheet Row Most Recent Value  Chronic Disease   Chronic disease during today's visit Other  [spoke with client about her current needs]  General Interventions   General  Interventions Discussed/Reviewed General Interventions Discussed, [employer] program support. discussed Meals on Wheels waiting list and how client can add her name to waiting list]  Exercise Interventions   Exercise Discussed/Reviewed Physical Activity  Education Interventions   Education Provided Provided Education  Provided Verbal Education On Mental Health/Coping with Illness  [encouraged client to call [facility] to talk with representative about adding client name to Meals on Wheels waiting list]  [place] Discussed/Reviewed Anxiety  [discussed mood of client]  Nutrition Interventions   Nutrition Discussed/Reviewed Nutrition Discussed        Follow up plan: Follow up call scheduled for 05/19/22 at 9:00 AM    Encounter Outcome:  Pt. Visit Completed   Norva Riffle.Gianni Fuchs MSW, [place] [employer] representative [facility] Care Management [phone]

## 2022-04-07 NOTE — Patient Instructions (Signed)
Visit Information  Thank you for taking time to visit with me today. Please don't hesitate to contact me if I can be of assistance to you.   Following are the goals we discussed today:   Goals Addressed               This Visit's Progress     Patient Stated she needed information on housing resources (pt-stated)        Interventions:  LCSW spoke with [name] via phone today. She said she received housing resources list from [facility] and received food pantry resources list from [place]. Her name is on waiting list for several apartment complexes Discussed Meals on Wheels . Encouraged client to call [facility] to talk with representative about adding her name to Meals on Wheels waiting list. She said she would call that agency to add her name to Meals on Wheels waiting list Discussed decreased sleep. She spoke of knee pain and leg pain. She said she has appointment with Orthopedist on April 09, 2022 Discussed family support from her 2 sons.  Discussed transport needs. She drives herself to and from her appointments Provided counseling support Encouraged client to call LCSW for SW needs at [phone].       Our next appointment is by telephone on 05/19/22 at 9:00 AM   Please call the care guide team at [phone] if you need to cancel or reschedule your appointment.   If you are experiencing a Mental Health or [place] or need someone to talk to, please go to [facility] [place] [phone])   The patient verbalized understanding of instructions, educational materials, and care plan provided today and DECLINED offer to receive copy of patient instructions, educational materials, and care plan.   The patient has been provided with contact information for the care management team and has been advised to call with any health related questions or concerns.   Norva Riffle.Cressie Betzler MSW,  [place] Holiday representative [facility] Care Management [phone]

## 2022-04-09 ENCOUNTER — Ambulatory Visit: Payer: Medicare Other | Attending: Internal Medicine

## 2022-04-10 ENCOUNTER — Other Ambulatory Visit: Payer: Self-pay | Admitting: Student

## 2022-04-10 DIAGNOSIS — E039 Hypothyroidism, unspecified: Secondary | ICD-10-CM

## 2022-04-14 ENCOUNTER — Other Ambulatory Visit: Payer: Self-pay | Admitting: Student

## 2022-04-14 DIAGNOSIS — E039 Hypothyroidism, unspecified: Secondary | ICD-10-CM

## 2022-04-28 ENCOUNTER — Ambulatory Visit: Payer: Self-pay | Admitting: Licensed Clinical Social Worker

## 2022-04-28 NOTE — Patient Instructions (Signed)
Visit Information  Thank you for taking time to visit with me today. Please don't hesitate to contact me if I can be of assistance to you.   Following are the goals we discussed today:   Goals Addressed             This Visit's Progress    patient plans to move to new apartment in May of 2024. She spoke of ongoing knee pain issues       Interventions:  LCSW spoke via phone with client about housing needs. She plans to move to new apartment in May of 2024. She is excited about plans to go to new apartment. She will be living close to her sons. Her sons are supportive of client Discussed transport needs. She drives to needed appointments and to complete errands Reviewed mood of client She said she is in positive mood. No mood issues Reviewed medication procurement Discussed program support. Reviewed ambulation issues She uses a walker to help her walk Provided counseling support Encouraged client to call LCSW as needed at [phone]    LCSW will call client as scheduled to discuss client needs.    Please call the care guide team at [phone] if you need to cancel or reschedule your appointment.   If you are experiencing a Mental Health or Behavioral Health Crisis or need someone to talk to, please go to [facility] [address] [place] [phone])   The patient verbalized understanding of instructions, educational materials, and care plan provided today and DECLINED offer to receive copy of patient instructions, educational materials, and care plan.   The patient has been provided with contact information for the care management team and has been advised to call with any health related questions or concerns.   Kelton Pillar.Raunel Dimartino MSW, LCSW Licensed Visual merchandiser [facility] Care Management [phone]

## 2022-04-28 NOTE — Patient Outreach (Signed)
  Care Coordination   Follow Up Visit Note   04/28/2022 Name: Lydia Walsh MRN: [id number] DOB: 06-May-1955  Lydia Walsh is a 67 y.o. year old female who sees Nooruddin, Jason Fila, MD for primary care. I spoke with  Lydia Walsh by phone today.  What matters to the patients health and wellness today? patient plans to move to new apartment in May  of 2024. She spoke of ongoing knee pain issues    Goals Addressed             This Visit's Progress    patient plans to move to new apartment in May of 2024. She spoke of ongoing knee pain issues       Interventions:  LCSW spoke via phone with client about housing needs. She plans to move to new apartment in May of 2024. She is excited about plans to go to new apartment. She will be living close to her sons. Her sons are supportive of client Discussed transport needs. She drives to needed appointments and to complete errands Reviewed mood of client She said she is in positive mood. No mood issues Reviewed medication procurement Discussed program support. Reviewed ambulation issues She uses a walker to help her walk Provided counseling support Encouraged client to call LCSW as needed at [phone]     SDOH assessments and interventions completed:  Yes  SDOH Interventions Today    Flowsheet Row Most Recent Value  SDOH Interventions   Physical Activity Interventions Other (Comments)  [uses a walker to help her walk, knee pain issues]  Stress Interventions Provide Counseling  [stress occasionally in managing medical needs]        Care Coordination Interventions:  Yes, provided   Interventions Today    Flowsheet Row Most Recent Value  Chronic Disease   Chronic disease during today's visit Other  [spoke via phone with client about her needs. discussed client housing plans. plans to move to new apartment in May 2024]  General Interventions   General Interventions Discussed/Reviewed General Interventions Discussed, [employer]  [discussed program support]  Exercise Interventions   Exercise Discussed/Reviewed Physical Activity  [uses walker to help her walk]  Physical Activity Discussed/Reviewed Physical Activity Reviewed  Education Interventions   Education Provided Provided Education  Mental Health Interventions   Mental Health Discussed/Reviewed Anxiety, Coping Strategies  Nutrition Interventions   Nutrition Discussed/Reviewed Nutrition Discussed  Safety Interventions   Safety Discussed/Reviewed Home Safety       Follow up plan: LCSW to call client as scheduled to discuss client needs   Encounter Outcome:  Pt. Visit Completed   Kelton Pillar.Kiyoko Mcguirt MSW, LCSW Licensed Visual merchandiser [facility] Care Management [phone]

## 2022-05-13 ENCOUNTER — Other Ambulatory Visit: Payer: Self-pay | Admitting: Student

## 2022-05-13 DIAGNOSIS — E876 Hypokalemia: Secondary | ICD-10-CM

## 2022-05-13 DIAGNOSIS — E039 Hypothyroidism, unspecified: Secondary | ICD-10-CM

## 2022-05-13 DIAGNOSIS — I10 Essential (primary) hypertension: Secondary | ICD-10-CM

## 2022-05-15 NOTE — Telephone Encounter (Signed)
Pt has not had blood work done for potassium check since September of 2023. NO recent documented discussion of hypokalemia and necessity for potassium pills. Last K was 4.4 in 9/23. Will hold for now, and discuss at next visit.

## 2022-05-19 ENCOUNTER — Other Ambulatory Visit: Payer: Self-pay | Admitting: Student

## 2022-05-19 ENCOUNTER — Ambulatory Visit: Payer: Self-pay | Admitting: Licensed Clinical Social Worker

## 2022-05-19 DIAGNOSIS — E876 Hypokalemia: Secondary | ICD-10-CM

## 2022-05-19 NOTE — Patient Outreach (Signed)
  Care Coordination   05/19/2022 Name: Lydia Walsh MRN: [id number] DOB: 1955/07/10   Care Coordination Outreach Attempts:  An unsuccessful telephone outreach was attempted today to offer the patient information about available care coordination services.  Follow Up Plan:  Additional outreach attempts will be made to offer the patient care coordination information and services.   Encounter Outcome:  No Answer   Care Coordination Interventions:  No, not indicated    Kelton Pillar.Murle Otting MSW, LCSW Licensed Visual merchandiser [facility] Care Management [phone]

## 2022-05-21 ENCOUNTER — Ambulatory Visit (INDEPENDENT_AMBULATORY_CARE_PROVIDER_SITE_OTHER): Payer: Medicare Other

## 2022-05-21 ENCOUNTER — Other Ambulatory Visit: Payer: Self-pay

## 2022-05-21 VITALS — BP 136/69 | HR 64 | Temp 98.1°F | Resp 24 | Ht 60.0 in | Wt 180.2 lb

## 2022-05-21 DIAGNOSIS — M7989 Other specified soft tissue disorders: Secondary | ICD-10-CM | POA: Insufficient documentation

## 2022-05-21 DIAGNOSIS — L729 Follicular cyst of the skin and subcutaneous tissue, unspecified: Secondary | ICD-10-CM

## 2022-05-21 DIAGNOSIS — E039 Hypothyroidism, unspecified: Secondary | ICD-10-CM | POA: Diagnosis not present

## 2022-05-21 DIAGNOSIS — I10 Essential (primary) hypertension: Secondary | ICD-10-CM | POA: Diagnosis present

## 2022-05-21 DIAGNOSIS — E785 Hyperlipidemia, unspecified: Secondary | ICD-10-CM | POA: Diagnosis not present

## 2022-05-21 NOTE — Patient Instructions (Addendum)
Thank you for coming to see [place] in clinic Lydia Walsh.  Plan:  - We got the following labs today and will call you with the results:    - BMP to check your potassium and kidney function (I will call you about restarting potassium if need be)  - TSH to check your thyroid function  - Lipid panel to check your cholesterol level  - Please see the following doctors:    - Cardiology: Dr. Allyson Sabal at [facility] (please call to schedule an appointment)   Address: [address], [place], [place] [address]    Phone: [phone]   - Dermatology (you will be called to schedule an appointment)   - Life alert contact information: [phone] - [employer]ARP contact information: [phone] (may be able to offer you a discount on life alert)  Return Precautions: If you develop worsening shortness of breath with activity, shortness of breath when laying flat at night, or worsening lower extremity swelling please call our office and visit the emergency department as these can be signs of heart failure.    It was very nice to see you, thank you for allowing [place] to be involved in your care. We look forward to seeing you next time. Please call our clinic at [phone] if you have any questions or concerns. The best time to call is Monday-Friday from 9am-4pm, but there is someone available 24/7. If after hours or the weekend, call the main hospital number at [phone] and ask for the Internal Medicine Resident On-Call. If you need medication refills, please notify your pharmacy one week in advance and they will send [place] a request.   Please make sure to arrive 15 minutes prior to your next appointment. If you arrive late, you may be asked to reschedule.

## 2022-05-21 NOTE — Progress Notes (Signed)
CC: HTN  HPI:  Lydia Walsh is a 67 y.o. female with past medical history of HTN, HLD, OSA, GERD, endometrial cancer, hypothyroidism, OA, osteoporosis, chronic back pain that presents for f/u for HTN.    Allergies as of 05/21/2022       Reactions   Shrimp (diagnostic)    Anaphylaxis   Hctz [hydrochlorothiazide] Rash        Medication List        Accurate as of May 21, 2022  9:48 AM. If you have any questions, ask your nurse or doctor.          alendronate 70 MG tablet Commonly known as: Fosamax Take 1 tablet (70 mg total) by mouth every 7 (seven) days. Take with a full glass of water on an empty stomach.   allopurinol 100 MG tablet Commonly known as: ZYLOPRIM Take 1 tablet by mouth once daily   amLODipine 10 MG tablet Commonly known as: NORVASC Take 1 tablet by mouth once daily   CENTRUM PO Take by mouth.   ezetimibe 10 MG tablet Commonly known as: ZETIA Take 1 tablet (10 mg total) by mouth daily.   fluticasone 50 MCG/ACT nasal spray Commonly known as: FLONASE USE 1 TO 2 SPRAY(S) IN EACH NOSTRIL ONCE DAILY AS NEEDED FOR  TREATMENT  OF  ALLERGIES   levothyroxine 112 MCG tablet Commonly known as: SYNTHROID Take 1 tablet by mouth once daily   metoprolol succinate 50 MG 24 hr tablet Commonly known as: TOPROL-XL TAKE 3 TABLETS BY MOUTH ONCE DAILY WITH MEALS   omeprazole 40 MG capsule Commonly known as: PRILOSEC Take 1 capsule (40 mg total) by mouth daily.   potassium chloride 10 MEQ tablet Commonly known as: KLOR-CON Take 1 tablet by mouth once daily   rosuvastatin 40 MG tablet Commonly known as: CRESTOR Take 1 tablet (40 mg total) by mouth daily.   spironolactone 25 MG tablet Commonly known as: ALDACTONE Take 3 tablets by mouth once daily         Past Medical History:  Diagnosis Date   Anemia    Back pain    Endometrial cancer (HCC)    Endometrial polyp    GERD (gastroesophageal reflux disease)    Gout    Heart murmur    Heart  palpitations    History of blood transfusion    History of chemotherapy    History of radiation therapy 09/02/2015-10/24/2015   Endometrium 10/15/15- 10/24/15 HDR Treatment, 09/02/15-10/08/15 50 Gy pelvic radiation; Dr. Antony Blackbird   History of uterine fibroid    Hyperlipidemia    Hypertension    Hypothyroidism    IBS (irritable bowel syndrome)    Joint pain    OSA (obstructive sleep apnea)    moderate OSA per study 06-21-2005--  per pt does need cpap anymore never purchased it   Osteoarthritis    Osteoporosis 02/23/2022   Patient has confirmed diagnosis of osteoporosis with DEXA scan done a few months ago.  Alendronate was sent in, however patient did not receive it.  Could be secondary to older age, as well as cancer treatment years ago.  Also, vitamin D levels at low normal at 34.  Given osteoporosis confirmed, will also supplement with vitamin D.  Will patient is agreeable to starting alendronate, and have expla   Other fatigue    Other specified disorders of thyroid    PMB (postmenopausal bleeding)    Shortness of breath on exertion    Wears dentures  upper   Wears glasses    Review of Systems:  per HPI.   Physical Exam: Vitals:   05/21/22 0831  BP: 136/69  Pulse: 64  Resp: (!) 24  Temp: 98.1 F (36.7 C)  TempSrc: Oral  SpO2: 99%  Weight: 180 lb 3.2 oz (81.7 kg)  Height: 5' (1.524 m)   Constitutional: Well-developed, well-nourished, appears comfortable  HENT: Normocephalic and atraumatic. 2 x 2 swelling on the left forehead that appears to be a cyst without erythema, warmth, or tenderness. Eyes: EOM are normal. PERRL.  Neck: Normal range of motion.  Cardiovascular: Regular rate, regular rhythm. No murmurs, rubs, or gallops. Normal radial and PT pulses bilaterally. Mild LE swelling bilaterally without pitting.  Pulmonary: Normal respiratory effort. No wheezes or crackles.  Abdominal: Soft. Non-distended. No tenderness. Normal bowel sounds.  Musculoskeletal: Normal  range of motion. No warmth, erythema, or tenderness of the knees bilaterally.     Neurological: Alert and oriented to person, place, and time. Non-focal. Skin: warm and dry.    Assessment & Plan:   See Encounters Tab for problem based charting.  Essential hypertension Current medications include amLODipine 10 MG daily, metoprolol succinate 150 MG daily, and spironolactone 75 MG daily. Also prescribed potassium chloride 10 MEQ daily for hypokalemia but was unable to get this medication over the last 2 weeks as pharmacy would not refill without recent BMP. Most recent BMP in 09/2021. Patient states that they are compliant with these medications. Patient states that they do not check their BP regularly at home. Patient denies HA, lightheadedness, dizziness, CP, or SOB. Initial BP today is 136/69.   Plan: - Continue amLODipine 10 MG daily, metoprolol succinate 150 MG daily, and spironolactone 75 MG daily - BMP today to check potassium - Continue potassium chloride 10 MEQ daily if low on BMP  Swelling of lower extremity Patient reports chronic lower extremity swelling over several months.  Not having any pain of her lower extremities. Also reports possible orthopnea at night. She believes her symptoms at night may also be due to GERD but is not sure. Is not having significant exertional dyspnea.  Echo from 10/2015 demonstrated EF 65-70% with G1DD.  Sees Dr. Allyson Sabal with cardiology and was last seen by him 1 year ago. No warmth or erythema on exam.  Has mild lower extremity swelling bilaterally on exam that is not pitting, likely mostly adipose tissue. Lungs are without crackles. Low suspicion for acute CHF exacerbation at this time. Discussed making a follow-up appointment with cardiology today, may need repeat echo. Given strict return precautions and instructions to visit the ED if she develops worsening lower extremity swelling, orthopnea, or SOB as these can be signs of heart failure. Will also f/u on  her swelling at her appointment in our clinic 1 month from now.   Plan: -F/u cardiology  Dyslipidemia Patient has a history of HLD. Current medications include rosuvastatin 40 MG daily and ezetimibe 10 MG daily. Patient states that they are compliant with this medication. Lipid panel from 11 months ago WNL w/ exception of LDL 98.    Plan: - Continue rosuvastatin 40 MG daily and ezetimibe 10 MG daily - Repeat lipid panel today  Hypothyroidism Patient has a history of hypothyroidism. Current medications include levothyroxine 112 MCG. Patient reports that they are compliant with this medication. Patient reports intermittent fatigue, constipation, cold intolerance at home. No recent TSH on file.   Plan: - Continue levothyroxine 112 MCG - TSH today    Patient  seen with Dr. Sol Blazing.

## 2022-05-21 NOTE — Assessment & Plan Note (Signed)
Patient has a history of hypothyroidism. Current medications include levothyroxine 112 MCG. Patient reports that they are compliant with this medication. Patient reports intermittent fatigue, constipation, cold intolerance at home. No recent TSH on file.   Plan: - Continue levothyroxine 112 MCG - TSH today

## 2022-05-21 NOTE — Assessment & Plan Note (Signed)
Patient has a history of HLD. Current medications include rosuvastatin 40 MG daily and ezetimibe 10 MG daily. Patient states that they are compliant with this medication. Lipid panel from 11 months ago WNL w/ exception of LDL 98.    Plan: - Continue rosuvastatin 40 MG daily and ezetimibe 10 MG daily - Repeat lipid panel today

## 2022-05-21 NOTE — Assessment & Plan Note (Addendum)
Patient reports chronic lower extremity swelling over several months.  Not having any pain of her lower extremities. Also reports possible orthopnea at night. She believes her symptoms at night may also be due to GERD but is not sure. Is not having significant exertional dyspnea.  Echo from 10/2015 demonstrated EF 65-70% with G1DD.  Sees Dr. Allyson Sabal with cardiology and was last seen by him 1 year ago. No warmth or erythema on exam.  Has mild lower extremity swelling bilaterally on exam that is not pitting, likely mostly adipose tissue. Lungs are without crackles. Low suspicion for acute CHF exacerbation at this time. Discussed making a follow-up appointment with cardiology today, may need repeat echo. Given strict return precautions and instructions to visit the ED if she develops worsening lower extremity swelling, orthopnea, or SOB as these can be signs of heart failure. Will also f/u on her swelling at her appointment in our clinic 1 month from now.   Plan: -F/u cardiology

## 2022-05-21 NOTE — Assessment & Plan Note (Signed)
Current medications include amLODipine 10 MG daily, metoprolol succinate 150 MG daily, and spironolactone 75 MG daily. Also prescribed potassium chloride 10 MEQ daily for hypokalemia but was unable to get this medication over the last 2 weeks as pharmacy would not refill without recent BMP. Most recent BMP in 09/2021. Patient states that they are compliant with these medications. Patient states that they do not check their BP regularly at home. Patient denies HA, lightheadedness, dizziness, CP, or SOB. Initial BP today is 136/69.   Plan: - Continue amLODipine 10 MG daily, metoprolol succinate 150 MG daily, and spironolactone 75 MG daily - BMP today to check potassium - Continue potassium chloride 10 MEQ daily if low on BMP

## 2022-05-22 ENCOUNTER — Other Ambulatory Visit: Payer: Self-pay | Admitting: Student

## 2022-05-22 DIAGNOSIS — I10 Essential (primary) hypertension: Secondary | ICD-10-CM

## 2022-05-22 LAB — BMP8+ANION GAP
Anion Gap: 16 mmol/L (ref 10.0–18.0)
BUN/Creatinine Ratio: 17 (ref 12–28)
BUN: 13 mg/dL (ref 8–27)
CO2: 20 mmol/L (ref 20–29)
Calcium: 9.9 mg/dL (ref 8.7–10.3)
Chloride: 106 mmol/L (ref 96–106)
Creatinine, Ser: 0.78 mg/dL (ref 0.57–1.00)
Glucose: 125 mg/dL — ABNORMAL HIGH (ref 70–99)
Potassium: 4.1 mmol/L (ref 3.5–5.2)
Sodium: 142 mmol/L (ref 134–144)
eGFR: 83 mL/min/{1.73_m2} (ref >59)

## 2022-05-22 LAB — LIPID PANEL
Chol/HDL Ratio: 2.3 ratio (ref 0.0–4.4)
Cholesterol, Total: 136 mg/dL (ref 100–199)
HDL: 59 mg/dL (ref >39)
LDL Chol Calc (NIH): 62 mg/dL (ref 0–99)
Triglycerides: 78 mg/dL (ref 0–149)
VLDL Cholesterol Cal: 15 mg/dL (ref 5–40)

## 2022-05-22 LAB — TSH: TSH: 5.61 u[IU]/mL — ABNORMAL HIGH (ref 0.450–4.500)

## 2022-05-25 NOTE — Progress Notes (Signed)
Internal Medicine Clinic Attending  Case discussed with Dr. Mapp  At the time of the visit.  We reviewed the resident's history and exam and pertinent patient test results.  I agree with the assessment, diagnosis, and plan of care documented in the resident's note.  

## 2022-05-26 ENCOUNTER — Other Ambulatory Visit: Payer: Self-pay

## 2022-05-26 ENCOUNTER — Telehealth: Payer: Self-pay | Admitting: *Deleted

## 2022-05-26 DIAGNOSIS — E876 Hypokalemia: Secondary | ICD-10-CM

## 2022-05-26 MED ORDER — LEVOTHYROXINE SODIUM 125 MCG PO TABS: 125.0000 ug | ORAL_TABLET | Freq: Every day | ORAL | 1 refills | Status: DC

## 2022-05-26 NOTE — Progress Notes (Signed)
  Care Coordination Note  05/26/2022 Name: Lydia Walsh MRN: [id number] DOB: 03-17-1955  Lydia Walsh is a 67 y.o. year old female who is a primary care patient of Nooruddin, Jason Fila, MD and is actively engaged with the care management team. I reached out to Lydia Walsh by phone today to assist with re-scheduling a follow up visit with the Licensed Clinical Social Worker  Follow up plan: Unsuccessful telephone outreach attempt made. A HIPAA compliant phone message was left for the patient providing contact information and requesting a return call.   [facility]  Care Coordination Care Guide  Direct Dial: [phone]

## 2022-05-26 NOTE — Progress Notes (Signed)
Called patient and updated on results. Current levothyroxine dose (112 mcg) is subtherapeutic. Will increase to 125 mcg daily and repeat TSH in 4-6 weeks. Patient understands and agrees with the plan.

## 2022-05-26 NOTE — Telephone Encounter (Signed)
BMP this week demonstrates normal potassium. Will discontinue potassium supplementation at this time.

## 2022-05-31 ENCOUNTER — Other Ambulatory Visit: Payer: Self-pay | Admitting: Internal Medicine

## 2022-05-31 DIAGNOSIS — I10 Essential (primary) hypertension: Secondary | ICD-10-CM

## 2022-06-01 NOTE — Progress Notes (Signed)
  Care Coordination Note  06/01/2022 Name: Lydia Walsh MRN: [id number] DOB: 10-04-1955  Lydia Walsh is a 67 y.o. year old female who is a primary care patient of Nooruddin, Jason Fila, MD and is actively engaged with the care management team. I reached out to [name] by phone today to assist with re-scheduling a follow up visit with the Licensed Clinical Social Worker  Follow up plan: Telephone appointment with care management team member scheduled for:06/03/22  [facility]  Care Coordination Care Guide  Direct Dial: [phone]

## 2022-06-03 ENCOUNTER — Encounter: Payer: Self-pay | Admitting: Licensed Clinical Social Worker

## 2022-06-23 ENCOUNTER — Other Ambulatory Visit: Payer: Self-pay | Admitting: Student

## 2022-06-23 DIAGNOSIS — I10 Essential (primary) hypertension: Secondary | ICD-10-CM

## 2022-06-23 NOTE — Progress Notes (Deleted)
CC: HTN f/u  HPI:  [name] is a 67 y.o. female with past medical history of HTN, HLD, AV block, OSA, OA, osteoporosis, GERD, hypothyroidism, endometrial cancer, MDD, obesity that presents for f/u HTN.    Allergies as of 06/23/2022       Reactions   Shrimp (diagnostic)    Anaphylaxis   Hctz [hydrochlorothiazide] Rash        Medication List        Accurate as of June 23, 2022  6:53 AM. If you have any questions, ask your nurse or doctor.          alendronate 70 MG tablet Commonly known as: Fosamax Take 1 tablet (70 mg total) by mouth every 7 (seven) days. Take with a full glass of water on an empty stomach.   allopurinol 100 MG tablet Commonly known as: ZYLOPRIM Take 1 tablet by mouth once daily   amLODipine 10 MG tablet Commonly known as: NORVASC Take 1 tablet by mouth once daily   CENTRUM PO Take by mouth.   ezetimibe 10 MG tablet Commonly known as: ZETIA Take 1 tablet (10 mg total) by mouth daily.   fluticasone 50 MCG/ACT nasal spray Commonly known as: FLONASE USE 1 TO 2 SPRAY(S) IN EACH NOSTRIL ONCE DAILY AS NEEDED FOR  TREATMENT  OF  ALLERGIES   levothyroxine 125 MCG tablet Commonly known as: Synthroid Take 1 tablet (125 mcg total) by mouth daily.   metoprolol succinate 50 MG 24 hr tablet Commonly known as: TOPROL-XL TAKE 3 TABLETS BY MOUTH ONCE DAILY WITH MEALS   omeprazole 40 MG capsule Commonly known as: PRILOSEC Take 1 capsule (40 mg total) by mouth daily.   potassium chloride 10 MEQ tablet Commonly known as: KLOR-CON Take 1 tablet by mouth once daily   rosuvastatin 40 MG tablet Commonly known as: CRESTOR Take 1 tablet (40 mg total) by mouth daily.   spironolactone 25 MG tablet Commonly known as: ALDACTONE Take 3 tablets by mouth once daily         Past Medical History:  Diagnosis Date   Anemia    Back pain    Endometrial cancer (HCC)    Endometrial polyp    GERD (gastroesophageal reflux disease)    Gout     Heart murmur    Heart palpitations    History of blood transfusion    History of chemotherapy    History of radiation therapy 09/02/2015-10/24/2015   Endometrium 10/15/15- 10/24/15 HDR Treatment, 09/02/15-10/08/15 50 Gy pelvic radiation; Dr. Antony Blackbird   History of uterine fibroid    Hyperlipidemia    Hypertension    Hypothyroidism    IBS (irritable bowel syndrome)    Joint pain    OSA (obstructive sleep apnea)    moderate OSA per study 06-21-2005--  per pt does need cpap anymore never purchased it   Osteoarthritis    Osteoporosis 02/23/2022   Patient has confirmed diagnosis of osteoporosis with DEXA scan done a few months ago.  Alendronate was sent in, however patient did not receive it.  Could be secondary to older age, as well as cancer treatment years ago.  Also, vitamin D levels at low normal at 34.  Given osteoporosis confirmed, will also supplement with vitamin D.  Will patient is agreeable to starting alendronate, and have expla   Other fatigue    Other specified disorders of thyroid    PMB (postmenopausal bleeding)    Shortness of breath on exertion    Wears  dentures    upper   Wears glasses    Review of Systems:  per HPI.   Physical Exam: *** There were no vitals filed for this visit. *** Constitutional: Well-developed, well-nourished, appears comfortable  HENT: Normocephalic and atraumatic.  Eyes: EOM are normal. PERRL.  Neck: Normal range of motion.  Cardiovascular: Regular rate, regular rhythm. No murmurs, rubs, or gallops. Normal radial and PT pulses bilaterally. No LE edema.  Pulmonary: Normal respiratory effort. No wheezes, rales, or rhonchi.   Abdominal: Soft. Non-distended. No tenderness. Normal bowel sounds.  Musculoskeletal: Normal range of motion.     Neurological: Alert and oriented to person, place, and time. Non-focal. Skin: warm and dry.    Assessment & Plan:   HTN Patient has a history of HTN. Current medications include amLODipine 10 MG daily,  metoprolol succinate 150 MG daily, spironolactone 75 MG daily. Patient states that they are *** compliant with these medications. Patient states that they do *** check their BP regularly at home. Patient denies HA, lightheadedness, dizziness, CP, or SOB. Initial BP today is ***. Repeat BP is ***.    Plan: - Continue amLODipine 10 MG daily, metoprolol succinate 150 MG daily, spironolactone 75 MG daily*** - BMP today***   2. Hypothyroidism Patient has a history of hypothyroidism. Current medications include levothyroxine 125 MCG daily, which was increased due to elevated TSH 1 month ago. Patient reports that they are *** compliant with this medication. Patient denies fatigue, weight gain, muscle soreness, constipation, cold-intolerance***.    Plan: - Continue levothyroxine 125 MCG daily*** - Repeat TSH in 2 weeks***   3.   Plan: - Continue    5. Health Screening: ***tdap, pneumonia vaccine -  - Medication refill? ***  -    See Encounters Tab for problem based charting.  No problem-specific Assessment & Plan notes found for this encounter.    Patient seen with Dr. Amadeo Garnet

## 2022-06-24 ENCOUNTER — Other Ambulatory Visit: Payer: Self-pay | Admitting: Cardiovascular Disease

## 2022-06-26 ENCOUNTER — Other Ambulatory Visit: Payer: Self-pay | Admitting: Student

## 2022-06-26 DIAGNOSIS — K219 Gastro-esophageal reflux disease without esophagitis: Secondary | ICD-10-CM

## 2022-06-29 ENCOUNTER — Encounter: Payer: Self-pay | Admitting: Student

## 2022-06-29 ENCOUNTER — Ambulatory Visit (INDEPENDENT_AMBULATORY_CARE_PROVIDER_SITE_OTHER): Payer: Medicare Other | Admitting: Student

## 2022-06-29 VITALS — BP 120/53 | HR 64 | Temp 98.3°F | Ht 60.0 in | Wt 181.3 lb

## 2022-06-29 DIAGNOSIS — M7989 Other specified soft tissue disorders: Secondary | ICD-10-CM | POA: Diagnosis not present

## 2022-06-29 DIAGNOSIS — K219 Gastro-esophageal reflux disease without esophagitis: Secondary | ICD-10-CM | POA: Diagnosis not present

## 2022-06-29 DIAGNOSIS — E039 Hypothyroidism, unspecified: Secondary | ICD-10-CM | POA: Diagnosis present

## 2022-06-29 MED ORDER — OMEPRAZOLE 40 MG PO CPDR
40.0000 mg | DELAYED_RELEASE_CAPSULE | Freq: Every day | ORAL | 1 refills | Status: DC
Start: 2022-06-29 — End: 2023-05-31

## 2022-06-29 NOTE — Assessment & Plan Note (Signed)
Patient reports her reflux symptoms have significantly improved on the omeprazole.  She has also stopped eating late at night. She recently ran out of her omeprazole and needs refill on this medication. -Refill omeprazole 40 mg daily

## 2022-06-29 NOTE — Progress Notes (Signed)
CC: Hypothyroidism follow-up  HPI:  Lydia Walsh is a 67 y.o. female with PMH as below who presents to clinic to follow-up on her hypothyroidism. Please see problem based charting for evaluation, assessment and plan.  Past Medical History:  Diagnosis Date   Anemia    Back pain    Endometrial cancer (HCC)    Endometrial polyp    GERD (gastroesophageal reflux disease)    Gout    Heart murmur    Heart palpitations    History of blood transfusion    History of chemotherapy    History of radiation therapy 09/02/2015-10/24/2015   Endometrium 10/15/15- 10/24/15 HDR Treatment, 09/02/15-10/08/15 50 Gy pelvic radiation; Dr. Antony Blackbird   History of uterine fibroid    Hyperlipidemia    Hypertension    Hypothyroidism    IBS (irritable bowel syndrome)    Joint pain    OSA (obstructive sleep apnea)    moderate OSA per study 06-21-2005--  per pt does need cpap anymore never purchased it   Osteoarthritis    Osteoporosis 02/23/2022   Patient has confirmed diagnosis of osteoporosis with DEXA scan done a few months ago.  Alendronate was sent in, however patient did not receive it.  Could be secondary to older age, as well as cancer treatment years ago.  Also, vitamin D levels at low normal at 34.  Given osteoporosis confirmed, will also supplement with vitamin D.  Will patient is agreeable to starting alendronate, and have expla   Other fatigue    Other specified disorders of thyroid    PMB (postmenopausal bleeding)    Shortness of breath on exertion    Wears dentures    upper   Wears glasses     Review of Systems:  Constitutional: Positive for insomnia Eyes: Negative for visual changes Respiratory: Negative for shortness of breath Cardiac: Negative for chest pain MSK: Positive for chronic knee pain and leg swelling Abdomen: Negative for abdominal pain and constipation Neuro: Negative for headache or weakness  Physical Exam: General: Pleasant, well-appearing obese elderly  woman. No acute distress. Cardiac: RRR. No murmurs, rubs or gallops. Trace BLE edema. Respiratory: Lungs CTAB. No wheezing or crackles. Abdominal: Soft, symmetric and non tender. Normal BS. Skin: Warm, dry and intact without rashes or lesions Extremities: Atraumatic. Full ROM. Palpable radial and DP pulses. Neuro: A&O x 3. Moves all extremities.  Normal sensation to gross touch. Psych: Appropriate mood and affect.  Vitals:   06/29/22 0823  BP: (!) 120/53  Pulse: 64  Temp: 98.3 F (36.8 C)  TempSrc: Oral  SpO2: 99%  Weight: 181 lb 4.8 oz (82.2 kg)  Height: 5' (1.524 m)    Assessment & Plan:   Hypothyroidism Patient here for follow-up after her levothyroxine was increased from 112 mcg to 125 mcg due to mildly elevated TSH with associated fatigue, constipation and cold intolerance. Today, patient states her symptoms have improved. She has been taking her levothyroxine as prescribed.  Plan: -Repeat TSH -Continue levothyroxine 125 mcg daily -Follow-up with PCP in 3 months  Swelling of lower extremity She has chronic nonpitting bilateral lower extremity edema that is very minimal. She has no other signs of heart failure. She will follow-up with cardiology in October. Her her lower extremity swelling is likely combination of her hypothyroidism and amlodipine. I have advised patient to try compression socks at home. I anticipate this will improve with improvement in her hypothyroidism.   Plan: -OTC compression socks -Follow-up with cardiology as scheduled on  10/07  G E R D Patient reports her reflux symptoms have significantly improved on the omeprazole.  She has also stopped eating late at night. She recently ran out of her omeprazole and needs refill on this medication. -Refill omeprazole 40 mg daily   See Encounters Tab for problem based charting.  Patient discussed with Dr. Kelby Aline, MD, MPH

## 2022-06-29 NOTE — Patient Instructions (Addendum)
Thank you, Ms.Rieley S Denis for allowing [place] to provide your care today. Today we discussed hypothyroidism, leg swelling and sleep difficulty. I am glad your symptoms have improved.   I have attached some information about sleep for you to follow to get quality sleep. For your leg swelling, I want you to try over the counter compression socks to help improve the swelling. Follow up with the heart doctors as scheduled  I have ordered the following labs for you:   Lab Orders         TSH      I will call if any are abnormal. All of your labs can be accessed through "My Chart".   My Chart Access: [email or web address]  Please follow-up in 3 months  Please make sure to arrive 15 minutes prior to your next appointment. If you arrive late, you may be asked to reschedule.    We look forward to seeing you next time. Please call our clinic at [phone] if you have any questions or concerns. The best time to call is Monday-Friday from 9am-4pm, but there is someone available 24/7. If after hours or the weekend, call the main hospital number and ask for the Internal Medicine Resident On-Call. If you need medication refills, please notify your pharmacy one week in advance and they will send [place] a request.   Thank you for letting us take part in your care. Wishing you the best!  Steffanie Rainwater, MD 06/29/2022, 8:56 AM IM Resident, PGY-3 [facility] 41:10

## 2022-06-29 NOTE — Assessment & Plan Note (Signed)
Patient here for follow-up after her levothyroxine was increased from 112 mcg to 125 mcg due to mildly elevated TSH with associated fatigue, constipation and cold intolerance. Today, patient states her symptoms have improved. She has been taking her levothyroxine as prescribed.  Plan: -Repeat TSH -Continue levothyroxine 125 mcg daily -Follow-up with PCP in 3 months

## 2022-06-29 NOTE — Progress Notes (Signed)
Internal Medicine Clinic Attending  Case discussed with Dr. Amponsah  At the time of the visit.  We reviewed the resident's history and exam and pertinent patient test results.  I agree with the assessment, diagnosis, and plan of care documented in the resident's note.  

## 2022-06-29 NOTE — Assessment & Plan Note (Addendum)
She has chronic nonpitting bilateral lower extremity edema that is very minimal. She has no other signs of heart failure. She will follow-up with cardiology in October. Her her lower extremity swelling is likely combination of her hypothyroidism and amlodipine. I have advised patient to try compression socks at home. I anticipate this will improve with improvement in her hypothyroidism.   Plan: -OTC compression socks -Follow-up with cardiology as scheduled on 10/07

## 2022-07-01 ENCOUNTER — Ambulatory Visit: Payer: Self-pay | Admitting: Licensed Clinical Social Worker

## 2022-07-01 ENCOUNTER — Other Ambulatory Visit: Payer: Self-pay | Admitting: Student

## 2022-07-01 LAB — TSH: TSH: 0.4 u[IU]/mL — ABNORMAL LOW (ref 0.450–4.500)

## 2022-07-01 NOTE — Progress Notes (Signed)
Patient's TSH is borderline low at 0.400. Patient tells me that she has been feeling much better after the increased dose of the levothyroxine. I will hold off on making any changes since patient is asymptomatic but I have advised her to follow-up in 4 to 6 weeks so we can repeat her TSH and with a free T4.  I have advised her to follow-up sooner if she becomes symptomatic.

## 2022-07-01 NOTE — Patient Outreach (Signed)
  Care Coordination   Follow Up Visit Note   07/01/2022 Name: Lydia Walsh MRN: [id number] DOB: [birth year]  Lydia Walsh is a 67 y.o. year old female who sees Lydia Walsh, Lydia Fila, MD for primary care. I spoke with  Lydia Walsh by phone today.  What matters to the patients health and wellness today?  Patient stated she has difficulty sleeping    Goals Addressed             This Visit's Progress    Patient Stated she has difficulty sleeping       Interventions:  Spoke with client about client needs Discussed program support Reviewed sleeping issues of client. She said she has difficulty sleeping. She spoke of pain issues in her legs. She spoke of pain in her legs and some swelling issues in her legs Client spoke of seeing a Dr. Luiz Walsh in the past for knee issues.she said support of Dr. Luiz Walsh had been helpful to her in the past Discussed client insurance. She has Medicare now. She plans to talk with DSS representative about her Medicaid status.  Client and LCSW spoke of Expanded Medicaid program in [place] Provided counseling support for client Discussed medication procurement Discussed walking issues. She uses a walker occasionally to help her walk Discussed her employment. She works part time  Discussed family support.  She has support with her sons Discussed mood of client.She did not mention any mood problems Encouraged Lydia Walsh to call LCSW as needed for SW support at [phone]        SDOH assessments and interventions completed:  Yes  SDOH Interventions Today    Flowsheet Row Most Recent Value  SDOH Interventions   Depression Interventions/Treatment  Counseling  Physical Activity Interventions Other (Comments)  [uses walker occasionally to help with walking]  Stress Interventions Other (Comment)  [has some stress related to managing medical needs]        Care Coordination Interventions:  Yes, provided   Interventions Today    Flowsheet Row Most Recent  Value  Chronic Disease   Chronic disease during today's visit Other  [spoke with client about client needs]  General Interventions   General Interventions Discussed/Reviewed General Interventions Discussed, [employer]  [discussed program services]  Exercise Interventions   Exercise Discussed/Reviewed Physical Activity  [uses walker occasionally]  Physical Activity Discussed/Reviewed Physical Activity Discussed  Education Interventions   Education Provided Provided Education  Provided Verbal Education On Community Resources  Mental Health Interventions   Mental Health Discussed/Reviewed Anxiety, Coping Strategies  [discussed client mood. she did not mention any mood issues. She is having some difficulty in sleeping]  Pharmacy Interventions   Pharmacy Dicussed/Reviewed Pharmacy Topics Discussed  Safety Interventions   Safety Discussed/Reviewed Fall Risk        Follow up plan: Follow up call scheduled for 08/26/22 at 10:30 AM     Encounter Outcome:  Pt. Visit Completed   Kelton Pillar.Pinchos Topel MSW, LCSW Licensed Visual merchandiser [facility] Care Management [phone]

## 2022-07-01 NOTE — Patient Instructions (Signed)
Visit Information  Thank you for taking time to visit with me today. Please don't hesitate to contact me if I can be of assistance to you.   Following are the goals we discussed today:   Goals Addressed             This Visit's Progress    Patient Stated she has difficulty sleeping       Interventions:  Spoke with client about client needs Discussed program support Reviewed sleeping issues of client. She said she has difficulty sleeping. She spoke of pain issues in her legs. She spoke of pain in her legs and some swelling issues in her legs Client spoke of seeing a Dr. Luiz Blare in the past for knee issues.she said support of Dr. Luiz Blare had been helpful to her in the past Discussed client insurance. She has Medicare now. She plans to talk with DSS representative about her Medicaid status.  Client and LCSW spoke of Expanded Medicaid program in [place] Provided counseling support for client Discussed medication procurement Discussed walking issues. She uses a walker occasionally to help her walk Discussed her employment. She works part time  Discussed family support.  She has support with her sons Discussed mood of client.She did not mention any mood problems Encouraged Loany to call LCSW as needed for SW support at [phone]        Our next appointment is by telephone on 08/26/22 at 10:30 AM   Please call the care guide team at [phone] if you need to cancel or reschedule your appointment.   If you are experiencing a Mental Health or Behavioral Health Crisis or need someone to talk to, please go to [facility] [address], [place] [phone])   The patient verbalized understanding of instructions, educational materials, and care plan provided today and DECLINED offer to receive copy of patient instructions, educational materials, and care plan.   The patient has been provided with contact information for the care  management team and has been advised to call with any health related questions or concerns.   Kelton Pillar.Sharry Beining MSW, LCSW Licensed Visual merchandiser [facility] Care Management [phone]

## 2022-07-15 ENCOUNTER — Other Ambulatory Visit: Payer: Self-pay | Admitting: Student

## 2022-07-15 DIAGNOSIS — I10 Essential (primary) hypertension: Secondary | ICD-10-CM

## 2022-08-25 ENCOUNTER — Telehealth: Payer: Self-pay

## 2022-08-25 NOTE — Telephone Encounter (Signed)
Pt is requesting a call back ... She stated that the pharmacy called her stating that she has not have  her potassium refilled in a while but she is not sure if she was taken off of the med

## 2022-08-26 ENCOUNTER — Ambulatory Visit: Payer: Self-pay | Admitting: Licensed Clinical Social Worker

## 2022-08-26 NOTE — Patient Outreach (Signed)
  Care Coordination   Follow Up Visit Note   08/26/2022 Name: Lydia Walsh MRN: [id number] DOB: 1955-03-15  Lydia Walsh is a 67 y.o. year old female who sees Nooruddin, Jason Fila, MD for primary care. I spoke with  Lydia Walsh /  Lydia Walsh, son of client, via phone today.   What matters to the patients health and wellness today?  Patient has stated she has some difficulty in sleeping      Goals Addressed             This Visit's Progress    Patient Stated she has difficulty sleeping       Interventions:  Spoke with Lydia Walsh, son of client, via phone today about client needs Discussed program support Discussed medication procurement for Lydia Walsh Discussed walking issues of client. She uses a walker occasionally to help her walk. She has occasional leg pain issues. She has occasional swelling in her legs Discussed her employment. She works part time  Discussed family support.  She has support with her sons Reviewed vision of client. Reviewed transport needs of client. Client drives herself to her job as needed. She drives to her appointments as needed Lydia Walsh for phone call with LCSW today Encouraged Lydia Walsh or Lydia Walsh  to call LCSW as needed for SW support for client at [phone]        SDOH assessments and interventions completed:  Yes  SDOH Interventions Today    Flowsheet Row Most Recent Value  SDOH Interventions   Depression Interventions/Treatment  Counseling  Physical Activity Interventions Other (Comments)  [uses a walker occasionally to help her walk]  Stress Interventions Other (Comment)  [has some stress in managing medical needs]        Care Coordination Interventions:  Yes, provided   Interventions Today    Flowsheet Row Most Recent Value  Chronic Disease   Chronic disease during today's visit Other  [spoke with Lydia Walsh, son of client, about client needs]  General Interventions   General Interventions  Discussed/Reviewed General Interventions Discussed, Community Resources  [discussed program support]  Education Interventions   Education Provided Provided Education  Provided Engineer, petroleum On [employer]  Mental Health Interventions   Mental Health Discussed/Reviewed Coping Strategies  [no mood issues noted]  Nutrition Interventions   Nutrition Discussed/Reviewed Nutrition Discussed  Pharmacy Interventions   Pharmacy Dicussed/Reviewed Pharmacy Topics Discussed  Safety Interventions   Safety Discussed/Reviewed Fall Risk        Follow up plan: Follow up call scheduled for 10/19/22 at 9:30 AM    Encounter Outcome:  Pt. Visit Completed   Kelton Pillar. MSW, LCSW Licensed Visual merchandiser [facility] Care Management [phone]

## 2022-08-26 NOTE — Patient Instructions (Signed)
Visit Information  Thank you for taking time to visit with me today. Please don't hesitate to contact me if I can be of assistance to you.   Following are the goals we discussed today:   Goals Addressed             This Visit's Progress    Patient Stated she has difficulty sleeping       Interventions:  Spoke with Franciso Bend, son of client, via phone today about client needs Discussed program support Discussed medication procurement for Tunisha Discussed walking issues of client. She uses a walker occasionally to help her walk. She has occasional leg pain issues. She has occasional swelling in her legs Discussed her employment. She works part time  Discussed family support.  She has support with her sons Reviewed vision of client. Reviewed transport needs of client. Client drives herself to her job as needed. She drives to her appointments as needed Anthony Sar for phone call with LCSW today Encouraged Idelle Jo or Franciso Bend  to call LCSW as needed for SW support for client at [phone]        Our next appointment is by telephone on 10/19/22 at 9:30 AM   Please call the care guide team at [phone] if you need to cancel or reschedule your appointment.   If you are experiencing a Mental Health or Behavioral Health Crisis or need someone to talk to, please go to [facility] [address], [place] [phone])   The patient / Lydia Walsh , son of patient, verbalized understanding of instructions, educational materials, and care plan provided today and DECLINED offer to receive copy of patient instructions, educational materials, and care plan.   The patient / [name], son of patient, has been provided with contact information for the care management team and has been advised to call with any health related questions or concerns.   Kelton Pillar. MSW, LCSW Licensed Visual merchandiser [facility] Care  Management [phone]

## 2022-09-03 ENCOUNTER — Telehealth: Payer: Self-pay | Admitting: Student

## 2022-09-03 ENCOUNTER — Other Ambulatory Visit: Payer: Self-pay | Admitting: Student

## 2022-09-03 DIAGNOSIS — I10 Essential (primary) hypertension: Secondary | ICD-10-CM

## 2022-09-03 NOTE — Telephone Encounter (Signed)
PT called requesting a Mammogram. Pt states [facility] asked her to contact her PCP to ask for an Order to be placed.  The pt states she is not having any breast issues and would like a yearly Mammogram only.  Can a order be placed for this patient?

## 2022-09-07 ENCOUNTER — Other Ambulatory Visit: Payer: Self-pay | Admitting: Student

## 2022-09-07 DIAGNOSIS — Z0000 Encounter for general adult medical examination without abnormal findings: Secondary | ICD-10-CM

## 2022-09-08 ENCOUNTER — Ambulatory Visit: Payer: Self-pay | Admitting: Licensed Clinical Social Worker

## 2022-09-08 NOTE — Patient Instructions (Signed)
Visit Information  Thank you for taking time to visit with me today. Please don't hesitate to contact me if I can be of assistance to you.   Following are the goals we discussed today:   Goals Addressed             This Visit's Progress    Patient Stated she is trying to work her part time job; she is trying to manage her medical needs       Interventions: Spoke with client about client needs Discussed program support with RN, LCSW, Pharmacist Discussed client's part time job Discussed transport issues. She said she drives her car as needed to work and to complete errands in the community Discussed medication procurement Discussed support from her family . She has support from her sons Discussed apartment where she resides. She said she is accessing her apartment with no problem Discussed pain issues. She spoke of leg pain issue. She spoke of knee pain issues Discussed vision issues of client. She wears glasses to help her with vision Provided counseling support for client Encouraged client to call LCSW as needed for SW support at [phone].          Our next appointment is by telephone on 11/02/22 at 1:00 PM   Please call the care guide team at [phone] if you need to cancel or reschedule your appointment.   If you are experiencing a Mental Health or Behavioral Health Crisis or need someone to talk to, please go to [facility] [address] [place] [phone])   The patient verbalized understanding of instructions, educational materials, and care plan provided today and DECLINED offer to receive copy of patient instructions, educational materials, and care plan.   The patient has been provided with contact information for the care management team and has been advised to call with any health related questions or concerns.   Kelton Pillar.Dauntae Derusha MSW, LCSW Licensed Visual merchandiser [facility] Care Management [phone]

## 2022-09-08 NOTE — Patient Outreach (Signed)
  Care Coordination   Follow Up Visit Note   09/08/2022 Name: Lydia Walsh MRN: [id number] DOB: [birth year]  Lydia Walsh is a 67 y.o. year old female who sees Nooruddin, Jason Fila, MD for primary care. I spoke with  [name] by phone today.  What matters to the patients health and wellness today?  Patient is trying to work her part time job. She is trying to manage her medical needs    Goals Addressed             This Visit's Progress    Patient Stated she is trying to work her part time job; she is trying to manage her medical needs       Interventions: Spoke with client about client needs Discussed program support with RN, LCSW, Pharmacist Discussed client's part time job Discussed transport issues. She said she drives her car as needed to work and to complete errands in the community Discussed medication procurement Discussed support from her family . She has support from her sons Discussed apartment where she resides. She said she is accessing her apartment with no problem Discussed pain issues. She spoke of leg pain issue. She spoke of knee pain issues Discussed vision issues of client. She wears glasses to help her with vision Provided counseling support for client Encouraged client to call LCSW as needed for SW support at [phone].          SDOH assessments and interventions completed:  Yes  SDOH Interventions Today    Flowsheet Row Most Recent Value  SDOH Interventions   Depression Interventions/Treatment  Counseling  Physical Activity Interventions Other (Comments)  [some mobility issues]  Stress Interventions Provide Counseling  [stress in managing medical needs]        Care Coordination Interventions:  Yes, provided   Interventions Today    Flowsheet Row Most Recent Value  Chronic Disease   Chronic disease during today's visit Other  [spoke with client about client needs]  General Interventions   General Interventions Discussed/Reviewed  General Interventions Discussed, Community Resources  Exercise Interventions   Exercise Discussed/Reviewed Physical Activity  [some mobility issues]  Education Interventions   Education Provided Provided Education  Provided Surveyor, minerals  Mental Health Interventions   Mental Health Discussed/Reviewed Coping Strategies  [discussed mood issues. She has some pain in standing. some difficulty in walking, She has difficulty in sleeping. She is trying to cope with stress issues faced]  Nutrition Interventions   Nutrition Discussed/Reviewed Nutrition Discussed  Pharmacy Interventions   Pharmacy Dicussed/Reviewed Pharmacy Topics Discussed        Follow up plan: Follow up call scheduled for 11/02/22 at 1:00 PM     Encounter Outcome:  Pt. Visit Completed   Kelton Pillar.Neida Ellegood MSW, LCSW Licensed Visual merchandiser [facility] Care Management [phone]

## 2022-09-17 ENCOUNTER — Other Ambulatory Visit: Payer: Self-pay | Admitting: Student

## 2022-09-17 DIAGNOSIS — Z0000 Encounter for general adult medical examination without abnormal findings: Secondary | ICD-10-CM

## 2022-09-23 ENCOUNTER — Encounter: Payer: Self-pay | Admitting: Urology

## 2022-09-23 ENCOUNTER — Other Ambulatory Visit: Payer: Self-pay | Admitting: Urology

## 2022-09-23 DIAGNOSIS — C642 Malignant neoplasm of left kidney, except renal pelvis: Secondary | ICD-10-CM

## 2022-09-23 DIAGNOSIS — N200 Calculus of kidney: Secondary | ICD-10-CM

## 2022-10-05 ENCOUNTER — Other Ambulatory Visit: Payer: Self-pay

## 2022-10-06 MED ORDER — LEVOTHYROXINE SODIUM 125 MCG PO TABS: 125.0000 ug | ORAL_TABLET | Freq: Every day | ORAL | 1 refills | Status: DC

## 2022-10-06 NOTE — Telephone Encounter (Signed)
Patient is scheduled to return 10/15.

## 2022-10-09 ENCOUNTER — Other Ambulatory Visit: Payer: Medicare Other

## 2022-10-16 ENCOUNTER — Other Ambulatory Visit: Payer: Self-pay | Admitting: Student

## 2022-10-16 DIAGNOSIS — I10 Essential (primary) hypertension: Secondary | ICD-10-CM

## 2022-10-19 ENCOUNTER — Ambulatory Visit: Payer: Medicare Other | Attending: Cardiovascular Disease | Admitting: Cardiovascular Disease

## 2022-10-19 ENCOUNTER — Encounter: Payer: Medicare Other | Admitting: Licensed Clinical Social Worker

## 2022-10-19 ENCOUNTER — Encounter: Payer: Self-pay | Admitting: Cardiovascular Disease

## 2022-10-19 VITALS — BP 134/72 | HR 62 | Ht 60.0 in | Wt 177.4 lb

## 2022-10-19 DIAGNOSIS — I10 Essential (primary) hypertension: Secondary | ICD-10-CM | POA: Diagnosis not present

## 2022-10-19 DIAGNOSIS — R931 Abnormal findings on diagnostic imaging of heart and coronary circulation: Secondary | ICD-10-CM

## 2022-10-19 DIAGNOSIS — E785 Hyperlipidemia, unspecified: Secondary | ICD-10-CM

## 2022-10-19 NOTE — Assessment & Plan Note (Signed)
History of elevated coronary calcium score of 384 performed 06/27/2020 principally the LAD territory.  She is completely asymptomatic.  LDL is at goal for secondary prevention.

## 2022-10-19 NOTE — Assessment & Plan Note (Signed)
History of essential hypertension blood pressure measured today at 134/72.  She is on amlodipine, metoprolol and spironolactone.

## 2022-10-19 NOTE — Progress Notes (Signed)
10/19/2022 Lydia Walsh   18-Nov-1955  [id number]  Primary Physician Nooruddin, Jason Fila, MD Primary Cardiologist: Runell Gess MD Nicholes Calamity, [place]  HPI:  Lydia Walsh is a 67 y.o.   moderately overweight divorced African American female mother of 3 children and is retired from [place].  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  She was referred by her PCP for evaluation of palpitations.  I last saw her in the office 05/27/2021.  She did see Theodore Demark, PA-C in the office 6 months later.  She's been evaluated for this in the past several years ago. She also has had a diagnosis of endometrial cancer in June 2017 she has no other cardiac risk factors. She does not drink caffeine. I ordered a 2-D echocardiogram which was entirely normal and the monitor that showed only sinus rhythm.  She did have a Port-A-Cath indwelling which was removed in 2018.   She  did have a CT scan performed 03/17/2018 that had incidental finding of coronary calcification .    She was complaining of atypical chest pain when I saw her a year ago but this is no longer is a symptom.     She did have a coronary calcium score performed 06/27/2020 which was 384 most of which was in the LAD and left main.  I did increase her rosuvastatin to 40 mg a day.    Since I saw her in the office a year and a half ago she continues to do well.  She gets occasional palpitations after she drinks coffee.  She otherwise denies chest pain or shortness of breath.  Her lipid profile is acceptable for secondary prevention.   Current Meds  Medication Sig   allopurinol (ZYLOPRIM) 100 MG tablet Take 1 tablet by mouth once daily   amLODipine (NORVASC) 10 MG tablet Take 1 tablet by mouth once daily   ezetimibe (ZETIA) 10 MG tablet Take 1 tablet by mouth once daily   fluticasone (FLONASE) 50 MCG/ACT nasal spray USE 1 TO 2 SPRAY(S) IN EACH NOSTRIL ONCE DAILY AS NEEDED FOR  TREATMENT  OF  ALLERGIES    levothyroxine (SYNTHROID) 125 MCG tablet Take 1 tablet (125 mcg total) by mouth daily.   metoprolol succinate (TOPROL-XL) 50 MG 24 hr tablet TAKE 3 TABLETS BY MOUTH ONCE DAILY WITH MEALS   Multiple Vitamins-Minerals (CENTRUM PO) Take by mouth.   omeprazole (PRILOSEC) 40 MG capsule Take 1 capsule (40 mg total) by mouth daily.   potassium chloride (KLOR-CON) 10 MEQ tablet Take 1 tablet by mouth once daily   rosuvastatin (CRESTOR) 40 MG tablet Take 1 tablet (40 mg total) by mouth daily.   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, Financial controller  Tobacco Use   Smoking status: Former    Current packs/day: 0.00    Types: Cigarettes    Start date: 02/29/1992    Quit date: 02/28/1994    Years since quitting: 28.6   Smokeless tobacco: Never  Vaping Use  Vaping status: Never Used  Substance and Sexual Activity   Alcohol use: No    Alcohol/week: 0.0 standard drinks of alcohol   Drug use: No   Sexual activity: Not Currently    Birth control/protection: Post-menopausal  Other Topics Concern   Not on file  Social History Narrative   Current Social History 11/11/2020        Patient lives alone in an apartment which is 1 story. There are not steps up to the entrance the patient uses.       Patient's method of transportation is personal car.      The highest level of education was high school diploma.      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      Identified important Relationships are "my kids"       Pets : poodle       Interests / Fun: "I love cooking"       Current Stressors: "none"       Religious / Personal Beliefs: "Holiness"    Social Determinants of Health   Financial Resource Strain: Medium Risk (03/17/2022)   Overall Financial Resource Strain (CARDIA)    Difficulty of Paying Living Expenses: Somewhat hard  Food Insecurity: Food Insecurity Present (02/23/2022)   Hunger Vital Sign    Worried About Running Out of Food in the Last Year: Sometimes true    Ran Out of Food in the Last Year: Never true  Transportation Needs: No Transportation Needs (02/23/2022)   PRAPARE - Administrator, Civil Service (Medical): No    Lack of Transportation (Non-Medical): No  Physical Activity: Inactive (09/08/2022)   Exercise Vital Sign    Days of Exercise per Week: 0 days    Minutes of Exercise per Session: 0 min  Stress: Stress Concern Present (09/08/2022)   [employer] of Occupational Health - Occupational Stress Questionnaire    Feeling of Stress : To some extent  Social Connections: Moderately Isolated (02/23/2022)   Social Connection and Isolation Panel [NHANES]    Frequency of Communication with Friends and Family: Three times a week    Frequency of Social Gatherings with Friends and Family: Never    Attends Religious Services: More than 4 times per year    Active Member of Clubs or Organizations: No    Attends Banker Meetings: Never    Marital Status: Widowed  Intimate Partner Violence: Not At Risk (02/23/2022)   Humiliation, Afraid, Rape, and Kick questionnaire    Fear of Current or Ex-Partner: No    Emotionally Abused: No    Physically Abused: No    Sexually Abused: No     Review of Systems: General: negative for chills, fever, night sweats or weight changes.  Cardiovascular: negative for chest pain, dyspnea on exertion, edema, orthopnea, palpitations, paroxysmal nocturnal dyspnea or shortness of breath Dermatological: negative for rash Respiratory: negative for cough or wheezing Urologic: negative for  hematuria Abdominal: negative for nausea, vomiting, diarrhea, bright red blood per rectum, melena, or hematemesis Neurologic: negative for visual changes, syncope, or dizziness All other systems reviewed and are otherwise negative except as noted above.    Blood pressure 134/72, pulse 62, height 5' (1.524 m), weight 177 lb 6.4 oz (80.5 kg), last menstrual period 07/16/2014, SpO2 96%.  General appearance: alert and no distress Neck: no adenopathy, no carotid bruit, no JVD, supple, symmetrical, trachea midline, and thyroid not enlarged, symmetric, no tenderness/mass/nodules Lungs: clear to auscultation bilaterally Heart:  regular rate and rhythm, S1, S2 normal, no murmur, click, rub or gallop Extremities: extremities normal, atraumatic, no cyanosis or edema Pulses: 2+ and symmetric Skin: Skin color, texture, turgor normal. No rashes or lesions Neurologic: Grossly normal  EKG EKG Interpretation Date/Time:  Monday October 19 2022 08:07:45 EDT Ventricular Rate:  62 PR Interval:  232 QRS Duration:  82 QT Interval:  404 QTC Calculation: 410 R Axis:   58  Text Interpretation: Sinus rhythm with 1st degree A-V block When compared with ECG of 29-Nov-2017 16:13, ST no longer elevated in Inferior leads Confirmed by Nanetta Batty [phone]) on 10/19/2022 8:31:41 AM    ASSESSMENT AND PLAN:   Essential hypertension History of essential hypertension blood pressure measured today at 134/72.  She is on amlodipine, metoprolol and spironolactone.  Dyslipidemia History of dyslipidemia on high-dose statin therapy at goal for secondary prevention with a lipid profile performed 05/21/2022 revealing total cholesterol 136, LDL 62 and HDL 59.  Elevated coronary artery calcium score History of elevated coronary calcium score of 384 performed 06/27/2020 principally the LAD territory.  She is completely asymptomatic.  LDL is at goal for secondary prevention.     Runell Gess MD FACP,FACC,FAHA,  [facility] 10/19/2022 8:34 AM

## 2022-10-19 NOTE — Assessment & Plan Note (Signed)
History of dyslipidemia on high-dose statin therapy at goal for secondary prevention with a lipid profile performed 05/21/2022 revealing total cholesterol 136, LDL 62 and HDL 59.

## 2022-10-19 NOTE — Patient Instructions (Signed)

## 2022-10-23 ENCOUNTER — Other Ambulatory Visit: Payer: Medicare Other

## 2022-10-27 ENCOUNTER — Encounter: Payer: Medicare Other | Admitting: Student

## 2022-10-27 ENCOUNTER — Encounter: Payer: Medicare Other | Admitting: Licensed Clinical Social Worker

## 2022-11-02 ENCOUNTER — Encounter: Payer: Medicare Other | Admitting: Licensed Clinical Social Worker

## 2022-11-04 ENCOUNTER — Inpatient Hospital Stay: Admission: RE | Admit: 2022-11-04 | Payer: Medicare Other | Source: Ambulatory Visit

## 2022-11-04 ENCOUNTER — Ambulatory Visit: Payer: Self-pay | Admitting: Licensed Clinical Social Worker

## 2022-11-04 NOTE — Patient Instructions (Signed)
Visit Information  Thank you for taking time to visit with me today. Please don't hesitate to contact me if I can be of assistance to you.   Following are the goals we discussed today:   Goals Addressed             This Visit's Progress    Patient Stated she is trying to work her part time job; she is trying to manage her medical needs       Interventions: Spoke with client via phone today about client needs Discussed program support with RN, LCSW, Pharmacist Discussed transport issues. She said she drives her car as needed to work and to complete errands in the community Discussed medication procurement Discussed support from her family . She has support from her sons Discussed apartment where she resides. She said she is accessing her apartment with no problem Discussed pain issues. She spoke of leg pain issue. She spoke of knee pain issues. She said when she drives her knees sometimes hurt Discussed vision issues of client. She wears glasses to help her with vision. She said her glasses help her with vision Discussed Medicaid status. Client was not sure if Medicaid was in place for her. She had called DSS and talked with representative but was not sure if she had Medicaid. She said she does not have a current Medicaid card.  LCSW to call DSS on behalf of client to inquire about Medicaid status of client Discussed client support from her sons Client said she has appointment next month with PCP Provided counseling support for client Encouraged client to call LCSW as needed for SW support at [phone].         LCSW to research Medicaid status of client and call client when LCSW has update on client Medicaid status    Please call the care guide team at [phone] if you need to cancel or reschedule your appointment.   If you are experiencing a Mental Health or Behavioral Health Crisis or need someone to talk to, please go to [facility] [address], [place] [phone])   The patient verbalized understanding of instructions, educational materials, and care plan provided today and DECLINED offer to receive copy of patient instructions, educational materials, and care plan.   The patient has been provided with contact information for the care management team and has been advised to call with any health related questions or concerns.   Kelton Pillar.Rayvon Brandvold MSW, LCSW Licensed Visual merchandiser [facility] Care Management [phone]

## 2022-11-04 NOTE — Patient Outreach (Signed)
Care Coordination   Follow Up Visit Note   11/04/2022 Name: Lydia Walsh MRN: [id number] DOB: 02/26/1955  Lydia Walsh is a 67 y.o. year old female who sees Nooruddin, Jason Fila, MD for primary care. I spoke with  Lydia Walsh by phone today.  What matters to the patients health and wellness today?  Patient Stated she is trying to work her part time job; she is trying to manage her medical needs    Goals Addressed             This Visit's Progress    Patient Stated she is trying to work her part time job; she is trying to manage her medical needs       Interventions: Spoke with client via phone today about client needs Discussed program support with RN, LCSW, Pharmacist Discussed transport issues. She said she drives her car as needed to work and to complete errands in the community Discussed medication procurement Discussed support from her family . She has support from her sons Discussed apartment where she resides. She said she is accessing her apartment with no problem Discussed pain issues. She spoke of leg pain issue. She spoke of knee pain issues. She said when she drives her knees sometimes hurt Discussed vision issues of client. She wears glasses to help her with vision. She said her glasses help her with vision Discussed Medicaid status. Client was not sure if Medicaid was in place for her. She had called DSS and talked with representative but was not sure if she had Medicaid. She said she does not have a current Medicaid card.  LCSW to call DSS on behalf of client to inquire about Medicaid status of client Discussed client support from her sons Client said she has appointment next month with PCP Provided counseling support for client Encouraged client to call LCSW as needed for SW support at [phone].          SDOH assessments and interventions completed:  Yes  SDOH Interventions Today    Flowsheet Row Most Recent Value  SDOH Interventions   Depression  Interventions/Treatment  Counseling  Physical Activity Interventions Other (Comments)  [uses a walker occasionally to help her walk]  Stress Interventions Provide Counseling        Care Coordination Interventions:  Yes, provided   Interventions Today    Flowsheet Row Most Recent Value  Chronic Disease   Chronic disease during today's visit Other  [spoke with client about client needs]  General Interventions   General Interventions Discussed/Reviewed General Interventions Discussed, Community Resources  Education Interventions   Education Provided Provided Education  Provided Verbal Education On [facility]  Mental Health Interventions   Mental Health Discussed/Reviewed Coping Strategies  [no mood issues noted]  Nutrition Interventions   Nutrition Discussed/Reviewed Nutrition Discussed  Pharmacy Interventions   Pharmacy Dicussed/Reviewed Pharmacy Topics Discussed        Follow up plan: LCSW to research Medicaid status of client and call client when LCSW has information for client regarding her Medicaid status    Encounter Outcome:  Patient Visit Completed   Lydia Walsh.Lydia Walsh MSW, LCSW Licensed Visual merchandiser [facility] Care Management [phone]

## 2022-11-17 ENCOUNTER — Encounter: Payer: Medicare Other | Admitting: Student

## 2022-11-20 ENCOUNTER — Ambulatory Visit
Admission: RE | Admit: 2022-11-20 | Discharge: 2022-11-20 | Disposition: A | Discharge status: Home / Self Care | Payer: Medicare Other | Source: Ambulatory Visit | Attending: Urology | Admitting: Urology

## 2022-11-20 DIAGNOSIS — C642 Malignant neoplasm of left kidney, except renal pelvis: Secondary | ICD-10-CM

## 2022-11-20 DIAGNOSIS — N200 Calculus of kidney: Secondary | ICD-10-CM

## 2022-11-20 MED ORDER — IOPAMIDOL (ISOVUE-300) INJECTION 61%
200.0000 mL | Freq: Once | INTRAVENOUS | Status: AC | PRN
Start: 2022-11-20 — End: 2022-11-20
  Administered 2022-11-20: 120 mL via INTRAVENOUS

## 2022-12-02 ENCOUNTER — Encounter: Payer: Medicare Other | Admitting: Student

## 2022-12-07 ENCOUNTER — Other Ambulatory Visit: Payer: Self-pay | Admitting: Student

## 2022-12-07 ENCOUNTER — Other Ambulatory Visit: Payer: Self-pay | Admitting: Cardiovascular Disease

## 2022-12-24 ENCOUNTER — Encounter: Payer: Medicare Other | Admitting: Student

## 2023-01-18 ENCOUNTER — Encounter: Payer: Medicare Other | Admitting: Internal Medicine

## 2023-01-18 NOTE — Progress Notes (Deleted)
 Hypothyroid TSH last checked 06/2022 0.400. Currently on levothyroxine  125 mcg daily. Denies associated symptoms***. She was advised to return in the July-August timeframe for repeat check of TSH, free T4 but has not been able to do so yet. Plan:TSH, free T4 today. Continue current regimen at this time.  HTN BP ***. OP regimen is amlodipine  10 mg daily, spironolactone  25 mg daily, metoprolol  50 mg daily which she is*** compliant with. BMP last checked 05/2022 with normal renal function and electrolytes. Plan:  HX HYPOK BMP last checked 05/2022 with normal renal function and electrolytes. On potassium supplementation OP, 10 mEq daily.*** Plan:  HLD OP regimen is zetia  10 mg daily, rosuvastatin  40 mg daily which she is*** compliant with. Lipid panel last checked 05/2022 with LDL at goal, 62.  Plan:

## 2023-01-26 ENCOUNTER — Ambulatory Visit: Payer: Medicare Other | Admitting: Dermatology

## 2023-01-28 ENCOUNTER — Telehealth: Payer: Self-pay | Admitting: *Deleted

## 2023-01-28 ENCOUNTER — Encounter: Payer: Self-pay | Admitting: Student

## 2023-01-28 ENCOUNTER — Ambulatory Visit (INDEPENDENT_AMBULATORY_CARE_PROVIDER_SITE_OTHER): Payer: Medicare Other | Admitting: Student

## 2023-01-28 VITALS — BP 167/71 | HR 66 | Temp 98.1°F | Ht 60.0 in | Wt 183.8 lb

## 2023-01-28 DIAGNOSIS — M542 Cervicalgia: Secondary | ICD-10-CM

## 2023-01-28 DIAGNOSIS — M170 Bilateral primary osteoarthritis of knee: Secondary | ICD-10-CM | POA: Diagnosis present

## 2023-01-28 DIAGNOSIS — I10 Essential (primary) hypertension: Secondary | ICD-10-CM | POA: Diagnosis present

## 2023-01-28 DIAGNOSIS — Z1231 Encounter for screening mammogram for malignant neoplasm of breast: Secondary | ICD-10-CM

## 2023-01-28 DIAGNOSIS — Z5982 Transportation insecurity: Secondary | ICD-10-CM

## 2023-01-28 DIAGNOSIS — E039 Hypothyroidism, unspecified: Secondary | ICD-10-CM

## 2023-01-28 DIAGNOSIS — M7989 Other specified soft tissue disorders: Secondary | ICD-10-CM | POA: Diagnosis not present

## 2023-01-28 MED ORDER — VALSARTAN 80 MG PO TABS: 80.0000 mg | ORAL_TABLET | Freq: Every day | ORAL | 3 refills | Status: DC

## 2023-01-28 NOTE — Progress Notes (Signed)
Established Patient Office Visit  Subjective   Patient ID: Lydia Walsh, female    DOB: [birth year]  Age: 68 y.o. MRN: [id number]  Chief Complaint  Patient presents with   Joint Swelling    Bil knee swelling    Lydia Walsh is a 68 y.o. who presents to the clinic for a follow up and for bilateral lower extremity pain. Please see problem based assessment and plan for additional details.   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) 07/10/2015   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HCC) 07/10/2015   Renal mass, left 06/21/2015   Nephrolithiasis 06/21/2015   Obesity (BMI 30-39.9) 06/21/2015   Iron deficiency anemia due to chronic blood loss 06/21/2015   Endometrial cancer (HCC) 03/29/2015   Major depression 06/02/2013   Fatigue 05/02/2013   Screening mammogram for breast cancer 08/19/2012   Osteoarthritis    LOW BACK PAIN, CHRONIC 02/27/2008   GOUT 11/24/2007   ALLERGIC RHINITIS, SEASONAL 03/09/2006   G E R D 02/19/2006   Hypothyroidism 02/17/2006   Dyslipidemia 02/17/2006   Essential hypertension 02/17/2006   OBSTRUCTIVE SLEEP APNEA 06/21/2005      Objective:     BP (!) 167/71 (BP Location: Left Arm, Patient Position: Sitting, Cuff Size: Normal)   Pulse 66   Temp 98.1 F (36.7 C) (Oral)   Ht 5' (1.524 m)   Wt 183 lb 12.8 oz (83.4 kg)   LMP 07/16/2014   SpO2 100%   BMI 35.90 kg/m  BP Readings from Last 3 Encounters:  01/28/23 (!) 167/71  10/19/22 134/72  06/29/22 (!) 120/53   Wt Readings from Last 3  Encounters:  01/28/23 183 lb 12.8 oz (83.4 kg)  10/19/22 177 lb 6.4 oz (80.5 kg)  06/29/22 181 lb 4.8 oz (82.2 kg)   Physical Exam Vitals reviewed.  Constitutional:      Appearance: She is obese.  Cardiovascular:     Rate and Rhythm: Normal rate and regular rhythm.  Musculoskeletal:     Right lower leg: Edema (Nonpitting edema extending to the level of her knees) present.     Left lower leg: Edema (Nonpitting edema extending to the level of her knee) present.  Skin:    General: Skin is warm and dry.  Neurological:     Mental Status: She is alert.      No results found for any visits on 01/28/23.  Last CBC Lab Results  Component Value Date   WBC 6.9 04/01/2021   HGB 12.9 04/01/2021   HCT 39.6 04/01/2021   MCV 88 04/01/2021   MCH 28.8 04/01/2021   RDW 14.6 04/01/2021   PLT 229 04/01/2021   Last metabolic panel Lab Results  Component Value Date   GLUCOSE 125 (H) 05/21/2022   NA 142 05/21/2022   K 4.1 05/21/2022   CL 106 05/21/2022   CO2 20 05/21/2022   BUN 13 05/21/2022   CREATININE 0.78 05/21/2022   EGFR 83 05/21/2022   CALCIUM 9.9 05/21/2022   PROT 7.4 05/27/2021   ALBUMIN 4.6 05/27/2021   LABGLOB  2.6 04/01/2021   AGRATIO 1.8 04/01/2021   BILITOT 0.6 05/27/2021   ALKPHOS 114 05/27/2021   AST 29 05/27/2021   ALT 33 (H) 05/27/2021   ANIONGAP 11 03/17/2018   Last thyroid functions Lab Results  Component Value Date   TSH 0.400 (L) 06/29/2022      The 10-year ASCVD risk score (Arnett DK, et al., 2019) is: 13.5%    Assessment & Plan:   Problem List Items Addressed This Visit       Cardiovascular and Mediastinum   Essential hypertension - Primary (Chronic)   Patient presents with uncontrolled hypertension.  Her blood pressure today was 167/71.  She is on a current regimen of amlodipine 10 mg, Toprol-XL 150mg  daily, and spironolactone 5 mg daily.  Patient reports compliance with her blood pressure regimen denies changes with her diet.  Patient also has  lower extremity edema that is nonpitting.  Will discontinue amlodipine 10 mg and start valsartan 80 mg. Plan: -Amlodipine 10 mg, discontinued -Start valsartan 80 mg      Relevant Medications   valsartan (DIOVAN) 80 MG tablet   Other Relevant Orders   BMP8+Anion Gap     Endocrine   Hypothyroidism   Is here for a follow-up of her hypothyroidism.  Her TSH in June 2024 was 0.400.  She denies new symptoms of hypothyroidism at this time.  She is on a current regimen of Synthroid 125 mcg and reports good compliance with this medication. Plan -TSH today -Will recheck a T4 today      Relevant Orders   TSH   T4, Free     Musculoskeletal and Integument   Primary osteoarthritis of knees, bilateral (Chronic)   Patient reports daily bilateral knee pain due to her osteoarthritis.  She does not take any over-the-counter medications for this pain at this point.  She would like to go to physical therapy for her bilateral osteoarthritis. Plan -PT referral ordered      Relevant Orders   Ambulatory referral to Physical Therapy   AMB Referral [facility] Care Management     Other   Screening mammogram for breast cancer   Patient would like a mammogram ordered for her breast cancer screening. Mammogram order placed      Neck pain on right side   Patient presents with anterior lateral right sided neck pain that is lateral to the trachea but medial to the sternocleidomastoid muscle.  The pain is also superior to the thyroid.  On physical exam, I was unable to appreciate lymphadenopathy or masses on her right neck.  Her thyroid did not feel enlarged.  She does have pain to light touch.  Exam of the oropharynx did not reveal tonsillar edema or pathology. Plan Will obtain an ultrasound of the right side of the neck to evaluate for soft tissue masses, fluid collections, or other pathology that be causing this pain      Relevant Orders   US Soft Tissue Head/Neck (NON-THYROID)   Swelling of lower extremity    Patient presents with bilateral lower extremity nonpitting edema.  Per chart review it seems like she was seen back in May 2024 for the same complaint.  On physical exam, the lower extremity edema is nonpitting and extends to the level of the knees bilaterally.  I suspect that the patient's nonpitting edema is due to medication use and not from more serious causes such as heart failure (patient does not have shortness of breath and her lungs are clear to auscultate bilaterally).  Plan -Will discontinue amlodipine -Encourage patient to use compression stockings      Relevant Orders   Compression stockings   AMB Referral [facility] Care Management   Other Visit Diagnoses       Encounter for screening mammogram for malignant neoplasm of breast       Relevant Orders   MM 3D SCREENING MAMMOGRAM BILATERAL BREAST     Problem with transportation       Relevant Orders   AMB Referral [facility] Care Management           Return in about 3 months (around 04/28/2023).    Faith Rogue, DO

## 2023-01-28 NOTE — Assessment & Plan Note (Signed)
Patient presents with anterior lateral right sided neck pain that is lateral to the trachea but medial to the sternocleidomastoid muscle.  The pain is also superior to the thyroid.  On physical exam, I was unable to appreciate lymphadenopathy or masses on her right neck.  Her thyroid did not feel enlarged.  She does have pain to light touch.  Exam of the oropharynx did not reveal tonsillar edema or pathology. Plan Will obtain an ultrasound of the right side of the neck to evaluate for soft tissue masses, fluid collections, or other pathology that be causing this pain

## 2023-01-28 NOTE — Assessment & Plan Note (Signed)
Is here for a follow-up of her hypothyroidism.  Her TSH in June 2024 was 0.400.  She denies new symptoms of hypothyroidism at this time.  She is on a current regimen of Synthroid 125 mcg and reports good compliance with this medication. Plan -TSH today -Will recheck a T4 today

## 2023-01-28 NOTE — Patient Instructions (Signed)
Thank you, [name] for allowing [place] to provide your care today. Today we discussed high blood pressure.    I have ordered the following labs for you:   Lab Orders         TSH         BMP8+Anion Gap         I have ordered the following medication/changed the following medications:   Stop the following medications: There are no discontinued medications.   Start the following medications: No orders of the defined types were placed in this encounter.    Follow up: 1 month    Remember: To stop taking amlodipine.  You may take all 3 metoprolol 50 mg tablets at once.  Blood pressure once a day and keep a log.  We will see you back in 1 month to follow-up on your blood pressure.  Should you have any questions or concerns please call the internal medicine clinic at [phone].     Faith Rogue, D.O. [facility]

## 2023-01-28 NOTE — Progress Notes (Signed)
Complex Care Management Note Care Guide Note  01/28/2023 Name: [name] MRN: [id number] DOB: [birth year]  Lydia Walsh is a 68 y.o. year old female who is a primary care patient of Nooruddin, Jason Fila, MD . The community resource team was consulted for assistance with Transportation Needs   SDOH screenings and interventions completed:  Yes  SDOH Interventions Today    Flowsheet Row Most Recent Value  SDOH Interventions   Transportation Interventions SCAT (Specialized Community Area Transporation), Payor Benefit  [Medicaid and also Access [place]]        Care guide performed the following interventions: Patient provided with information about care guide support team and interviewed to confirm resource needs. Made an appt for 830 am Friday to complete the access [place] application  Follow Up Plan:  Care guide will follow up with patient by phone over the next day  Encounter Outcome:  Patient Visit Completed  Dione Booze [facility] Health  Population Health Careguide  Direct Dial: [phone] Website: [email or web address]

## 2023-01-28 NOTE — Assessment & Plan Note (Signed)
Patient reports daily bilateral knee pain due to her osteoarthritis.  She does not take any over-the-counter medications for this pain at this point.  She would like to go to physical therapy for her bilateral osteoarthritis. Plan -PT referral ordered

## 2023-01-28 NOTE — Assessment & Plan Note (Addendum)
Patient presents with uncontrolled hypertension.  Her blood pressure today was 167/71.  She is on a current regimen of amlodipine 10 mg, Toprol-XL 150mg  daily, and spironolactone 5 mg daily.  Patient reports compliance with her blood pressure regimen denies changes with her diet.  Patient also has lower extremity edema that is nonpitting.  Will discontinue amlodipine 10 mg and start valsartan 80 mg.  Patient was asked to check her blood pressure daily and keep a log.  She was also asked to give [place] call back in 2 weeks with her blood pressure readings. Plan: -Amlodipine 10 mg, discontinued -Start valsartan 80 mg, she will need a 2-4 week BMP follow up -RTC in one month

## 2023-01-28 NOTE — Assessment & Plan Note (Signed)
Patient would like a mammogram ordered for her breast cancer screening. Mammogram order placed

## 2023-01-28 NOTE — Assessment & Plan Note (Signed)
Patient presents with bilateral lower extremity nonpitting edema.  Per chart review it seems like she was seen back in May 2024 for the same complaint.  On physical exam, the lower extremity edema is nonpitting and extends to the level of the knees bilaterally.  I suspect that the patient's nonpitting edema is due to medication use and not from more serious causes such as heart failure (patient does not have shortness of breath and her lungs are clear to auscultate bilaterally).  Plan -Will discontinue amlodipine -Encourage patient to use compression stockings

## 2023-01-29 ENCOUNTER — Telehealth: Payer: Self-pay | Admitting: *Deleted

## 2023-01-29 LAB — T4, FREE: Free T4: 1.37 ng/dL (ref 0.82–1.77)

## 2023-01-29 LAB — BMP8+ANION GAP
Anion Gap: 18 mmol/L (ref 10.0–18.0)
BUN/Creatinine Ratio: 24 (ref 12–28)
BUN: 13 mg/dL (ref 8–27)
CO2: 23 mmol/L (ref 20–29)
Calcium: 9.7 mg/dL (ref 8.7–10.3)
Chloride: 102 mmol/L (ref 96–106)
Creatinine, Ser: 0.55 mg/dL — ABNORMAL LOW (ref 0.57–1.00)
Glucose: 116 mg/dL — ABNORMAL HIGH (ref 70–99)
Potassium: 3.4 mmol/L — ABNORMAL LOW (ref 3.5–5.2)
Sodium: 143 mmol/L (ref 134–144)
eGFR: 99 mL/min/{1.73_m2} (ref >59)

## 2023-01-29 LAB — TSH: TSH: 1.43 u[IU]/mL (ref 0.450–4.500)

## 2023-01-29 NOTE — Progress Notes (Signed)
Complex Care Management Note Care Guide Note  01/29/2023 Name: Lydia Walsh MRN: [id number] DOB: 09-05-1955  Lydia Walsh is a 68 y.o. year old female who is a primary care patient of Nooruddin, Jason Fila, MD . The community resource team was consulted for assistance with Transportation Needs , Food Insecurity, and Financial Difficulties related to Housing   SDOH screenings and interventions completed:  Yes  SDOH Interventions Today    Flowsheet Row Most Recent Value  SDOH Interventions   Food Insecurity Interventions [facility] Referral, Facilities manager provided referal to [facility] and also [place] [facility] referal to  Food pantrys and also mailed additional resources for other food banks]  Housing Interventions Community Resources Provided, [facility] Referral  Transportation Interventions SCAT (Specialized Community Area Transporation)  Patent examiner does not have medicaid transportation or medicaid after  1 /31/2025, completed patient part of access [place] application part b faxed to dr office]  Financial Strain Interventions [facility] Provided  [employer] and food pantrys and senior transportation resources have been provided]        Care guide performed the following interventions: Follow up call placed to the patient to discuss status of referral. Housing and food pantrys and senior transportation resources have been provided  Follow Up Plan:  No further follow up planned at this time. The patient has been provided with needed resources.  Encounter Outcome:  Patient Visit Completed  Dione Booze  [place]  [facility] Guide   Direct Dial:[phone]  Website: [email or web address]

## 2023-02-03 NOTE — Addendum Note (Signed)
Addended by: Dickie La on: 02/03/2023 10:20 AM   Modules accepted: Level of Service

## 2023-02-03 NOTE — Progress Notes (Signed)
Internal Medicine Clinic Attending  Case discussed with the resident at the time of the visit.  We reviewed the resident's history and exam and pertinent patient test results.  I agree with the assessment, diagnosis, and plan of care documented in the resident's note.  Patient will need return in one month for BP follow-up given changes in medication regimen. F/u scheduled 03/01/23.

## 2023-02-04 NOTE — Therapy (Deleted)
OUTPATIENT PHYSICAL THERAPY LOWER EXTREMITY EVALUATION   Patient Name: Lydia Walsh MRN: [id number] DOB:12/08/1955, 68 y.o., female Today's Date: 02/04/2023  END OF SESSION:   Past Medical History:  Diagnosis Date   Anemia    Back pain    Endometrial cancer (HCC)    Endometrial polyp    GERD (gastroesophageal reflux disease)    Gout    Heart murmur    Heart palpitations    History of blood transfusion    History of chemotherapy    History of radiation therapy 09/02/2015-10/24/2015   Endometrium 10/15/15- 10/24/15 HDR Treatment, 09/02/15-10/08/15 50 Gy pelvic radiation; Dr. Antony Blackbird   History of uterine fibroid    Hyperlipidemia    Hypertension    Hypothyroidism    IBS (irritable bowel syndrome)    Joint pain    OSA (obstructive sleep apnea)    moderate OSA per study 06-21-2005--  per pt does need cpap anymore never purchased it   Osteoarthritis    Osteoporosis 02/23/2022   Patient has confirmed diagnosis of osteoporosis with DEXA scan done a few months ago.  Alendronate was sent in, however patient did not receive it.  Could be secondary to older age, as well as cancer treatment years ago.  Also, vitamin D levels at low normal at 34.  Given osteoporosis confirmed, will also supplement with vitamin D.  Will patient is agreeable to starting alendronate, and have expla   Other fatigue    Other specified disorders of thyroid    PMB (postmenopausal bleeding)    Shortness of breath on exertion    Wears dentures    upper   Wears glasses    Past Surgical History:  Procedure Laterality Date   ABDOMINAL HYSTERECTOMY     BREAST EXCISIONAL BIOPSY Right    benign   CHOLECYSTECTOMY     CHOLECYSTECTOMY, LAPAROSCOPIC  2020   COLONOSCOPY     DILATATION & CURETTAGE/HYSTEROSCOPY WITH MYOSURE N/A 03/07/2015   Procedure: DILATATION & CURETTAGE/HYSTEROSCOPY WITH MYOSURE (POLYP);  Surgeon: Richardean Chimera, MD;  Location: [facility];  Service: Gynecology;  Laterality: N/A;    DILATATION & CURRETTAGE/HYSTEROSCOPY WITH RESECTOCOPE  12-13-2002  &  09-26-2004   polyp/  submucosal fibroid   ESOPHAGOGASTRODUODENOSCOPY     EXCISION BENIGN RIGHT BREAST MASS  age 61   IR REMOVAL TUN ACCESS W/ PORT W/O FL MOD SED  07/17/2016   KNEE ARTHROSCOPY W/ MENISCECTOMY Bilateral right 04-12-2006//  left 12-29-2006   and Chondroplasty   port placement     ROBOTIC ASSISTED TOTAL HYSTERECTOMY WITH BILATERAL SALPINGO OOPHERECTOMY Bilateral 05/02/2015   Procedure: XI ROBOTIC ASSISTED TOTAL HYSTERECTOMY WITH BILATERAL SALPINGO OOPHORECTOMY AND SENTINAL LYMPH NODE BIOPSY;  Surgeon: Laurette Schimke, MD;  Location: [facility];  Service: Gynecology;  Laterality: Bilateral;   TRANSTHORACIC ECHOCARDIOGRAM  04-02-2010   normal LV, ef 65%   TUBAL LIGATION  1980's   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) 07/10/2015   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HCC) 07/10/2015   Renal mass, left 06/21/2015   Nephrolithiasis 06/21/2015   Obesity (  BMI 30-39.9) 06/21/2015   Iron deficiency anemia due to chronic blood loss 06/21/2015   Endometrial cancer (HCC) 03/29/2015   Major depression 06/02/2013   Fatigue 05/02/2013   Screening mammogram for breast cancer 08/19/2012   Osteoarthritis    LOW BACK PAIN, CHRONIC 02/27/2008   GOUT 11/24/2007   ALLERGIC RHINITIS, SEASONAL 03/09/2006   G E R D 02/19/2006   Hypothyroidism 02/17/2006   Dyslipidemia 02/17/2006   Essential hypertension 02/17/2006   OBSTRUCTIVE SLEEP APNEA 06/21/2005    PCP:  Olegario Messier, MD   REFERRING PROVIDER: Dickie La, MD  REFERRING DIAG: M17.0 (ICD-10-CM) - Primary osteoarthritis of knees, bilateral  THERAPY DIAG:  No diagnosis found.  Rationale for Evaluation and Treatment: Rehabilitation  ONSET DATE: chronic  SUBJECTIVE:   SUBJECTIVE STATEMENT: ***  PERTINENT HISTORY: Primary osteoarthritis of knees, bilateral (Chronic)   Patient reports daily bilateral knee pain due to her osteoarthritis.  She does not take any over-the-counter medications for this pain at this point.  She would like to go to physical therapy for her bilateral osteoarthritis. Plan -PT referral ordered       Relevant Orders   Ambulatory referral to Physical Therapy   AMB Referral [facility] Care Management PAIN:  Are you having pain? {OPRCPAIN:27236}  PRECAUTIONS: None  RED FLAGS: None   WEIGHT BEARING RESTRICTIONS: No  FALLS:  Has patient fallen in last 6 months? No  OCCUPATION: ***  PLOF: Independent  PATIENT GOALS: ***  NEXT MD VISIT: TBD  OBJECTIVE:  Note: Objective measures were completed at Evaluation unless otherwise noted.  DIAGNOSTIC FINDINGS: FINDINGS: Mildly decreased bone mineralization.   Moderate medial and mild-to-moderate lateral compartment joint space narrowing within the knee. Severe patellofemoral joint space narrowing and peripheral osteophytosis. Mild joint effusion of the knee. Large posterior femoral condyle degenerative osteophytes.   Small calcaneal heel spur.   No acute fracture or dislocation.   IMPRESSION: Severe patellofemoral and moderate medial greater than lateral compartment of the knee osteoarthritis.     Electronically Signed   By: Neita Garnet M.D.   On: 02/19/2021 08:51  PATIENT SURVEYS:  FOTO ***  MUSCLE LENGTH: Hamstrings: Right *** deg; Left *** deg Maisie Fus test: Right *** deg; Left *** deg  POSTURE: {posture:25561}  PALPATION: ***  LOWER EXTREMITY ROM:  {AROM/PROM:27142} ROM  Right eval Left eval  Hip flexion    Hip extension    Hip abduction    Hip adduction    Hip internal rotation    Hip external rotation    Knee flexion    Knee extension    Ankle dorsiflexion    Ankle plantarflexion    Ankle inversion    Ankle eversion     (Blank rows = not tested)  LOWER EXTREMITY MMT:  MMT Right eval Left eval  Hip flexion    Hip extension    Hip abduction    Hip adduction    Hip internal rotation    Hip external rotation    Knee flexion    Knee extension    Ankle dorsiflexion    Ankle plantarflexion    Ankle inversion    Ankle eversion     (Blank rows = not tested)  LOWER EXTREMITY SPECIAL TESTS:  {LEspecialtests:26242}  FUNCTIONAL TESTS:  30 seconds chair stand test  GAIT: Distance walked: *** Assistive device utilized: {Assistive devices:23999} Level of assistance: {Levels of assistance:24026} Comments: ***  TREATMENT DATE: ***    PATIENT EDUCATION:  Education details: Discussed eval findings, rehab rationale and POC and patient is in agreement  Person educated: Patient Education method: Explanation Education comprehension: verbalized understanding and needs further education  HOME EXERCISE PROGRAM: ***  ASSESSMENT:  CLINICAL IMPRESSION: Patient is a *** y.o. *** who was seen today for physical therapy evaluation and treatment for ***.   OBJECTIVE IMPAIRMENTS: {opptimpairments:25111}.   ACTIVITY LIMITATIONS: {activitylimitations:27494}  PARTICIPATION LIMITATIONS: {participationrestrictions:25113}  PERSONAL FACTORS: {Personal factors:25162} are also affecting patient's functional outcome.   REHAB POTENTIAL: Fair based on previous imaging and advance degenerative changes  CLINICAL DECISION MAKING: Stable/uncomplicated  EVALUATION COMPLEXITY: Low   GOALS: Goals reviewed with patient?  No  SHORT TERM GOALS: Target date: *** *** Baseline: Goal status: INITIAL  2.  *** Baseline:  Goal status: INITIAL  3.  *** Baseline:  Goal status: INITIAL  4.  *** Baseline:  Goal status: INITIAL  5.  *** Baseline:  Goal status: INITIAL  6.  *** Baseline:  Goal status: INITIAL  LONG TERM GOALS: Target date: ***  *** Baseline:  Goal status: INITIAL  2.  *** Baseline:  Goal status: INITIAL  3.  *** Baseline:  Goal status: INITIAL  4.  *** Baseline:  Goal status: INITIAL  5.  *** Baseline:  Goal status: INITIAL  6.  *** Baseline:  Goal status: INITIAL   PLAN:  PT FREQUENCY: 1-2x/week  PT DURATION: 6 weeks  PLANNED INTERVENTIONS: 97164- PT Re-evaluation, 97110-Therapeutic exercises, 97530- Therapeutic activity, 97112- Neuromuscular re-education, 97535- Self Care, 40981- Manual therapy, 97116- Gait training, and Dry Needling  PLAN FOR NEXT SESSION: HEP review and update, manual techniques as appropriate, aerobic tasks, ROM and flexibility activities, strengthening and PREs, TPDN, gait and balance training as needed     Hildred Laser, PT 02/04/2023, 4:53 PM

## 2023-02-08 ENCOUNTER — Ambulatory Visit: Payer: Medicare Other | Attending: Internal Medicine

## 2023-03-01 ENCOUNTER — Encounter: Payer: Medicare Other | Admitting: Student

## 2023-03-01 NOTE — Progress Notes (Deleted)
 CC: ***  HPI:  [name] is a 68 y.o. female living with a history stated below and presents today for ***. Please see problem based assessment and plan for additional details.  Past Medical History:  Diagnosis Date   Anemia    Back pain    Endometrial cancer (HCC)    Endometrial polyp    GERD (gastroesophageal reflux disease)    Gout    Heart murmur    Heart palpitations    History of blood transfusion    History of chemotherapy    History of radiation therapy 09/02/2015-10/24/2015   Endometrium 10/15/15- 10/24/15 HDR Treatment, 09/02/15-10/08/15 50 Gy pelvic radiation; Dr. Antony Blackbird   History of uterine fibroid    Hyperlipidemia    Hypertension    Hypothyroidism    IBS (irritable bowel syndrome)    Joint pain    OSA (obstructive sleep apnea)    moderate OSA per study 06-21-2005--  per pt does need cpap anymore never purchased it   Osteoarthritis    Osteoporosis 02/23/2022   Patient has confirmed diagnosis of osteoporosis with DEXA scan done a few months ago.  Alendronate was sent in, however patient did not receive it.  Could be secondary to older age, as well as cancer treatment years ago.  Also, vitamin D levels at low normal at 34.  Given osteoporosis confirmed, will also supplement with vitamin D.  Will patient is agreeable to starting alendronate, and have expla   Other fatigue    Other specified disorders of thyroid    PMB (postmenopausal bleeding)    Shortness of breath on exertion    Wears dentures    upper   Wears glasses     Current Outpatient Medications on File Prior to Visit  Medication Sig Dispense Refill   allopurinol (ZYLOPRIM) 100 MG tablet Take 1 tablet by mouth once daily 90 tablet 0   amLODipine (NORVASC) 10 MG tablet Take 1 tablet by mouth once daily 90 tablet 0   ezetimibe (ZETIA) 10 MG tablet Take 1 tablet by mouth once daily 90 tablet 3   fluticasone (FLONASE) 50 MCG/ACT nasal spray USE 1 TO 2 SPRAY(S) IN EACH NOSTRIL ONCE DAILY AS  NEEDED FOR  TREATMENT  OF  ALLERGIES 16 g 0   levothyroxine (SYNTHROID) 125 MCG tablet Take 1 tablet (125 mcg total) by mouth daily. 60 tablet 1   metoprolol succinate (TOPROL-XL) 50 MG 24 hr tablet TAKE 3 TABLETS BY MOUTH ONCE DAILY WITH MEALS 270 tablet 0   Multiple Vitamins-Minerals (CENTRUM PO) Take by mouth.     omeprazole (PRILOSEC) 40 MG capsule Take 1 capsule (40 mg total) by mouth daily. 90 capsule 1   potassium chloride (KLOR-CON) 10 MEQ tablet Take 1 tablet by mouth once daily 90 tablet 0   rosuvastatin (CRESTOR) 40 MG tablet Take 1 tablet by mouth once daily 90 tablet 0   spironolactone (ALDACTONE) 25 MG tablet Take 3 tablets by mouth once daily 270 tablet 0   valsartan (DIOVAN) 80 MG tablet Take 1 tablet (80 mg total) by mouth daily. 30 tablet 3   [DISCONTINUED] esomeprazole (NEXIUM) 20 MG capsule Take 2 capsules (40 mg total) by mouth daily before breakfast. 30 capsule 3   No current facility-administered medications on file prior to visit.    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, Financial controller  Tobacco Use   Smoking status: Former    Current packs/day: 0.00    Types: Cigarettes    Start date: 02/29/1992    Quit date: 02/28/1994    Years since quitting: 29.0   Smokeless tobacco: Never  Vaping Use   Vaping status: Never Used  Substance and Sexual Activity   Alcohol use: No     Alcohol/week: 0.0 standard drinks of alcohol   Drug use: No   Sexual activity: Not Currently    Birth control/protection: Post-menopausal  Other Topics Concern   Not on file  Social History Narrative   Current Social History 11/11/2020        Patient lives alone in an apartment which is 1 story. There are not steps up to the entrance the patient uses.       Patient's method of transportation is personal car.      The highest level of education was high school diploma.      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      Identified important Relationships are "my kids"       Pets : poodle       Interests / Fun: "I love cooking"      Current Stressors: "none"       Religious / Personal Beliefs: "Holiness"    Social Drivers of Health   Financial Resource Strain: Medium Risk (01/29/2023)   Overall Financial Resource Strain (CARDIA)    Difficulty of Paying Living Expenses: Somewhat hard  Food Insecurity: Food Insecurity Present (01/29/2023)   Hunger Vital Sign    Worried About Running Out of Food in the Last Year: Sometimes true    Ran Out of Food in the Last Year: Never true  Transportation Needs: Unmet Transportation Needs (01/28/2023)   PRAPARE - Administrator, Civil Service (Medical): Yes    Lack of Transportation (Non-Medical): No  Physical Activity: Inactive (11/04/2022)   Exercise Vital Sign    Days of Exercise per Week: 0 days    Minutes of Exercise per Session: 0 min  Stress: Stress Concern Present (11/04/2022)   [employer] of Occupational Health - Occupational Stress Questionnaire    Feeling of Stress : To some extent  Social Connections: Moderately Isolated (02/23/2022)   Social Connection and Isolation Panel [NHANES]    Frequency of Communication with Friends and Family: Three times a week    Frequency of Social Gatherings with Friends and Family: Never    Attends Religious Services: More than 4 times per year     Active Member of Clubs or Organizations: No    Attends Banker Meetings: Never    Marital Status: Widowed  Intimate Partner Violence: Not At Risk (02/23/2022)   Humiliation, Afraid, Rape, and Kick questionnaire  Fear of Current or Ex-Partner: No    Emotionally Abused: No    Physically Abused: No    Sexually Abused: No    Review of Systems: ROS negative except for what is noted on the assessment and plan.  There were no vitals filed for this visit.  Physical Exam: Constitutional: well-appearing *** sitting in ***, in no acute distress HENT: normocephalic atraumatic, mucous membranes moist Eyes: conjunctiva non-erythematous Cardiovascular: regular rate and rhythm, no m/r/g Pulmonary/Chest: normal work of breathing on room air, lungs clear to auscultation bilaterally Abdominal: soft, non-tender, non-distended MSK: normal bulk and tone Neurological: alert & oriented x 3, no focal deficit Skin: warm and dry Psych: normal mood and behavior  Assessment & Plan:   No problem-specific Assessment & Plan notes found for this encounter.     Patient {GC/GE:[id number]::"discussed with","seen with"} Dr. {[id number]::"FAOZHYQM","V. Hoffman","Mullen","Narendra","Vincent","Guilloud","Lau","Machen"}    Kathleen Lime, M.D [facility] Phone: [phone] Date 03/01/2023 Time 8:06 AM

## 2023-03-04 ENCOUNTER — Ambulatory Visit: Payer: Medicare Other

## 2023-03-07 ENCOUNTER — Other Ambulatory Visit: Payer: Self-pay | Admitting: Internal Medicine

## 2023-03-07 ENCOUNTER — Other Ambulatory Visit: Payer: Self-pay | Admitting: Student

## 2023-03-07 ENCOUNTER — Other Ambulatory Visit: Payer: Self-pay | Admitting: Cardiovascular Disease

## 2023-03-11 ENCOUNTER — Ambulatory Visit: Payer: Medicare Other | Admitting: Student

## 2023-03-11 VITALS — BP 177/72 | HR 66 | Temp 98.4°F | Ht 60.0 in | Wt 182.6 lb

## 2023-03-11 DIAGNOSIS — I10 Essential (primary) hypertension: Secondary | ICD-10-CM | POA: Diagnosis not present

## 2023-03-11 DIAGNOSIS — N3001 Acute cystitis with hematuria: Secondary | ICD-10-CM

## 2023-03-11 DIAGNOSIS — N3090 Cystitis, unspecified without hematuria: Secondary | ICD-10-CM

## 2023-03-11 DIAGNOSIS — N3000 Acute cystitis without hematuria: Secondary | ICD-10-CM | POA: Insufficient documentation

## 2023-03-11 HISTORY — DX: Cystitis, unspecified without hematuria: N30.90

## 2023-03-11 HISTORY — DX: Acute cystitis without hematuria: N30.00

## 2023-03-11 LAB — POCT URINALYSIS DIPSTICK
Bilirubin, UA: NEGATIVE
Blood, UA: NEGATIVE
Glucose, UA: NEGATIVE
Ketones, UA: NEGATIVE
Nitrite, UA: NEGATIVE
Protein, UA: NEGATIVE
Spec Grav, UA: 1.025 (ref 1.010–1.025)
Urobilinogen, UA: 0.2 U/dL
pH, UA: 5.5 (ref 5.0–8.0)

## 2023-03-11 MED ORDER — NITROFURANTOIN MONOHYD MACRO 100 MG PO CAPS: 100.0000 mg | ORAL_CAPSULE | Freq: Two times a day (BID) | ORAL | 0 refills | Status: AC

## 2023-03-11 MED ORDER — SPIRONOLACTONE 25 MG PO TABS: 75.0000 mg | ORAL_TABLET | Freq: Every day | ORAL | 0 refills | Status: DC

## 2023-03-11 NOTE — Progress Notes (Signed)
 CC: Burning with urination   HPI:  Lydia Walsh is a 67 y.o. female living with a history stated below and presents today for blood pressure follow up and also discuss her burning with urination. Please see problem based assessment and plan for additional details.  Past Medical History:  Diagnosis Date   Anemia    Back pain    Endometrial cancer (HCC)    Endometrial polyp    GERD (gastroesophageal reflux disease)    Gout    Heart murmur    Heart palpitations    History of blood transfusion    History of chemotherapy    History of radiation therapy 09/02/2015-10/24/2015   Endometrium 10/15/15- 10/24/15 HDR Treatment, 09/02/15-10/08/15 50 Gy pelvic radiation; Dr. Antony Blackbird   History of uterine fibroid    Hyperlipidemia    Hypertension    Hypothyroidism    IBS (irritable bowel syndrome)    Joint pain    OSA (obstructive sleep apnea)    moderate OSA per study 06-21-2005--  per pt does need cpap anymore never purchased it   Osteoarthritis    Osteoporosis 02/23/2022   Patient has confirmed diagnosis of osteoporosis with DEXA scan done a few months ago.  Alendronate was sent in, however patient did not receive it.  Could be secondary to older age, as well as cancer treatment years ago.  Also, vitamin D levels at low normal at 34.  Given osteoporosis confirmed, will also supplement with vitamin D.  Will patient is agreeable to starting alendronate, and have expla   Other fatigue    Other specified disorders of thyroid    PMB (postmenopausal bleeding)    Shortness of breath on exertion    Wears dentures    upper   Wears glasses     Current Outpatient Medications on File Prior to Visit  Medication Sig Dispense Refill   allopurinol (ZYLOPRIM) 100 MG tablet Take 1 tablet by mouth once daily 90 tablet 0   amLODipine (NORVASC) 10 MG tablet Take 1 tablet by mouth once daily 90 tablet 0   ezetimibe (ZETIA) 10 MG tablet Take 1 tablet by mouth once daily 90 tablet 3   fluticasone  (FLONASE) 50 MCG/ACT nasal spray USE 1 TO 2 SPRAY(S) IN EACH NOSTRIL ONCE DAILY AS NEEDED FOR  TREATMENT  OF  ALLERGIES 16 g 0   levothyroxine (SYNTHROID) 125 MCG tablet Take 1 tablet by mouth once daily 60 tablet 0   metoprolol succinate (TOPROL-XL) 50 MG 24 hr tablet TAKE 3 TABLETS BY MOUTH ONCE DAILY WITH MEALS 270 tablet 0   Multiple Vitamins-Minerals (CENTRUM PO) Take by mouth.     omeprazole (PRILOSEC) 40 MG capsule Take 1 capsule (40 mg total) by mouth daily. 90 capsule 1   rosuvastatin (CRESTOR) 40 MG tablet Take 1 tablet by mouth once daily 90 tablet 0   valsartan (DIOVAN) 80 MG tablet Take 1 tablet (80 mg total) by mouth daily. 30 tablet 3   [DISCONTINUED] esomeprazole (NEXIUM) 20 MG capsule Take 2 capsules (40 mg total) by mouth daily before breakfast. 30 capsule 3   No current facility-administered medications on file prior to visit.    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, Financial controller  Tobacco Use   Smoking status: Former    Current packs/day: 0.00    Types: Cigarettes    Start date: 02/29/1992    Quit date: 02/28/1994    Years since quitting: 29.0   Smokeless tobacco: Never  Vaping Use   Vaping status: Never Used  Substance and Sexual Activity   Alcohol use: No    Alcohol/week: 0.0 standard drinks of alcohol   Drug use: No   Sexual activity: Not Currently    Birth  control/protection: Post-menopausal  Other Topics Concern   Not on file  Social History Narrative   Current Social History 11/11/2020        Patient lives alone in an apartment which is 1 story. There are not steps up to the entrance the patient uses.       Patient's method of transportation is personal car.      The highest level of education was high school diploma.      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      Identified important Relationships are "my kids"       Pets : poodle       Interests / Fun: "I love cooking"      Current Stressors: "none"       Religious / Personal Beliefs: "Holiness"    Social Drivers of Health   Financial Resource Strain: Medium Risk (01/29/2023)   Overall Financial Resource Strain (CARDIA)    Difficulty of Paying Living Expenses: Somewhat hard  Food Insecurity: Food Insecurity Present (01/29/2023)   Hunger Vital Sign    Worried About Running Out of Food in the Last Year: Sometimes true    Ran Out of Food in the Last Year: Never true  Transportation Needs: Unmet Transportation Needs (01/28/2023)   PRAPARE - Administrator, Civil Service (Medical): Yes    Lack of Transportation (Non-Medical): No  Physical Activity: Inactive (11/04/2022)   Exercise Vital Sign    Days of Exercise per Week: 0 days    Minutes of Exercise per Session: 0 min  Stress: Stress Concern Present (11/04/2022)   [employer] of Occupational Health - Occupational Stress Questionnaire    Feeling of Stress : To some extent  Social Connections: Moderately Isolated (02/23/2022)   Social Connection and Isolation Panel [NHANES]    Frequency of Communication with Friends and Family: Three times a week    Frequency of Social Gatherings with Friends and Family: Never    Attends Religious Services: More than 4 times per year    Active Member of Clubs or Organizations: No    Attends Banker Meetings: Never    Marital  Status: Widowed  Intimate Partner Violence: Not At Risk (02/23/2022)   Humiliation, Afraid, Rape, and Kick questionnaire    Fear of Current or Ex-Partner: No    Emotionally Abused: No    Physically Abused: No  Sexually Abused: No    Review of Systems: ROS negative except for what is noted on the assessment and plan.  Vitals:   03/11/23 0830 03/11/23 0844  BP: (!) 189/69 (!) 177/72  Pulse: 65 66  Temp: 98.4 F (36.9 C)   TempSrc: Oral   SpO2: 99%   Weight: 182 lb 9.6 oz (82.8 kg)   Height: 5' (1.524 m)     Physical Exam: Constitutional: well-appearing woman, sitting in chair, in no acute distress HENT: normocephalic atraumatic, mucous membranes moist Eyes: conjunctiva non-erythematous Cardiovascular: regular rate and rhythm, no m/r/g Pulmonary/Chest: normal work of breathing on room air, lungs clear to auscultation bilaterally Abdominal: soft, non-tender, non-distended MSK:  Neurological: alert & oriented x 3, no focal deficit Skin: warm and dry Psych: normal mood and behavior  Assessment & Plan:   Cystitis, acute This is a 68 year old female who presents to me in the office due to concerns for burning with urination for the past 2 days. She however denies any fever or chills ,n/v or any lower back pain  or any vaginal discharge during this period.Reports she had similar symptoms in 2022 but hasn't had any until now. On exam , she doesn't have any CVA tenderness.  Her point-of-care UA showed a trace of leukocyte esterase with no evidence of blood or ketones in the urine consistent with acute simple cystitis.  In the absence of nausea and vomiting, low back pain and CVA tenderness I do not think she has progressed to pyelonephritis.  Will treat for simple cystitis with Macrobid 100 mg twice daily for 7 days and have her call back if symptoms persist after her last dose. -Prescribe Macrobid 100 mg twice daily for 7 days  Essential hypertension The patient has a history of  hypertension.  She was previously being managed with amlodipine and was discontinued and switched to valsartan at a previous visit due to lower extremity swelling.  She reports her lower extremity swelling has markedly decreased after she stopped her amlodipine.  She however was not sure if she needed to take her valsartan daily as her other blood pressure medications so she has not been taking it as prescribed.  I took an ample amount of time to review her medications and the required dosing.  Patient endorses complete understanding of when she needed taking medication with a teach back technique.  This patient had hypokalemia of 3.4 a month ago and will recheck BMP today to assess her electrolytes since she will be taking valsartan and is already on spironolactone.  Will give patient a blood pressure log to record her blood pressures at home. Will have this patient come back in 2 weeks for blood pressure recheck. If this patient blood pressure continue to be above goal at her next visit after consistently taking her valsartan,  please consider going up on her valsartan if her BMP is reassuring.   - Continue valsartan - Follow-up in 2 weeks for nurse only visit for BP. - BMP    Patient discussed with Dr. Heloise Beecham, M.D [facility] Phone: [phone] Date 03/11/2023 Time 3:38 PM

## 2023-03-11 NOTE — Assessment & Plan Note (Addendum)
 The patient has a history of hypertension.  She was previously being managed with amlodipine and was discontinued and switched to valsartan at a previous visit due to lower extremity swelling.  She reports her lower extremity swelling has markedly decreased after she stopped her amlodipine.  She however was not sure if she needed to take her valsartan daily as her other blood pressure medications so she has not been taking it as prescribed.  I took an ample amount of time to review her medications and the required dosing.  Patient endorses complete understanding of when she needed taking medication with a teach back technique.  This patient had hypokalemia of 3.4 a month ago and will recheck BMP today to assess her electrolytes since she will be taking valsartan and is already on spironolactone.  Will give patient a blood pressure log to record her blood pressures at home. Will have this patient come back in 2 weeks for blood pressure recheck. If this patient blood pressure continue to be above goal at her next visit after consistently taking her valsartan,  please consider going up on her valsartan if her BMP is reassuring.   - Continue valsartan - Follow-up in 2 weeks for nurse only visit for BP. - BMP

## 2023-03-11 NOTE — Patient Instructions (Signed)
 Thank you, Ms.Charde S Frysinger for allowing [place] to provide your care today. Today we discussed your blood pressure and your burning with urination.Your UA came back positive for a trace of leukocyte Estrace which means you might have some sort of infection in your kidneys.  I am prescribing you an antibiotic called Macrobid.  Please take 100 mg 1 in the morning and 1 in the evening for 7 days.  Your symptoms continue to persist after the last dose please give [place] a call and let us know.  Blood pressure still elevated today in the office and because you did not take your valsartan I am wondering if that was the reason why your blood pressure still elevated.  Please remember to take all of your blood pressure medications as discussed.  I have ordered the following labs for you:  Lab Orders         Basic metabolic panel         POCT Urinalysis Dipstick (81002)       Tests ordered today:    Referrals ordered today:   Referral Orders  No referral(s) requested today     I have ordered the following medication/changed the following medications:   Stop the following medications: Medications Discontinued During This Encounter  Medication Reason   potassium chloride (KLOR-CON) 10 MEQ tablet    spironolactone (ALDACTONE) 25 MG tablet Reorder     Start the following medications: Meds ordered this encounter  Medications   nitrofurantoin, macrocrystal-monohydrate, (MACROBID) 100 MG capsule    Sig: Take 1 capsule (100 mg total) by mouth 2 (two) times daily for 7 days.    Dispense:  14 capsule    Refill:  0   spironolactone (ALDACTONE) 25 MG tablet    Sig: Take 3 tablets (75 mg total) by mouth daily.    Dispense:  270 tablet    Refill:  0     Follow up: 2 weeks for blood pressure check only.     Should you have any questions or concerns please call the internal medicine clinic at [phone].    Kathleen Lime, M.D [facility]

## 2023-03-11 NOTE — Assessment & Plan Note (Addendum)
 This is a 68 year old female who presents to me in the office due to concerns for burning with urination for the past 2 days. She however denies any fever or chills ,n/v or any lower back pain  or any vaginal discharge during this period.Reports she had similar symptoms in 2022 but hasn't had any until now. On exam , she doesn't have any CVA tenderness.  Her point-of-care UA showed a trace of leukocyte esterase with no evidence of blood or ketones in the urine consistent with acute simple cystitis.  In the absence of nausea and vomiting, low back pain and CVA tenderness I do not think she has progressed to pyelonephritis.  Will treat for simple cystitis with Macrobid 100 mg twice daily for 7 days and have her call back if symptoms persist after her last dose. -Prescribe Macrobid 100 mg twice daily for 7 days

## 2023-03-12 ENCOUNTER — Ambulatory Visit (HOSPITAL_COMMUNITY): Payer: Medicare Other

## 2023-03-12 LAB — BASIC METABOLIC PANEL
BUN/Creatinine Ratio: 25 (ref 12–28)
BUN: 17 mg/dL (ref 8–27)
CO2: 20 mmol/L (ref 20–29)
Calcium: 10 mg/dL (ref 8.7–10.3)
Chloride: 104 mmol/L (ref 96–106)
Creatinine, Ser: 0.69 mg/dL (ref 0.57–1.00)
Glucose: 119 mg/dL — ABNORMAL HIGH (ref 70–99)
Potassium: 3.9 mmol/L (ref 3.5–5.2)
Sodium: 144 mmol/L (ref 134–144)
eGFR: 94 mL/min/{1.73_m2} (ref >59)

## 2023-03-12 NOTE — Progress Notes (Signed)
 Internal Medicine Clinic Attending  Case discussed with the resident at the time of the visit.  We reviewed the resident's history and exam and pertinent patient test results.  I agree with the assessment, diagnosis, and plan of care documented in the resident's note.

## 2023-03-16 ENCOUNTER — Ambulatory Visit (HOSPITAL_COMMUNITY): Admission: RE | Admit: 2023-03-16 | Payer: Medicare Other | Source: Ambulatory Visit

## 2023-03-18 ENCOUNTER — Emergency Department (HOSPITAL_COMMUNITY)

## 2023-03-18 ENCOUNTER — Ambulatory Visit (INDEPENDENT_AMBULATORY_CARE_PROVIDER_SITE_OTHER)
Admission: EM | Admit: 2023-03-18 | Discharge: 2023-03-18 | Disposition: A | Discharge status: Home / Self Care | Source: Home / Self Care

## 2023-03-18 ENCOUNTER — Other Ambulatory Visit: Payer: Self-pay

## 2023-03-18 ENCOUNTER — Encounter (HOSPITAL_COMMUNITY): Payer: Self-pay

## 2023-03-18 ENCOUNTER — Emergency Department (HOSPITAL_COMMUNITY)
Admission: EM | Admit: 2023-03-18 | Discharge: 2023-03-18 | Disposition: A | Discharge status: Home / Self Care | Attending: Emergency Medicine | Admitting: Emergency Medicine

## 2023-03-18 DIAGNOSIS — I10 Essential (primary) hypertension: Secondary | ICD-10-CM | POA: Insufficient documentation

## 2023-03-18 DIAGNOSIS — F17200 Nicotine dependence, unspecified, uncomplicated: Secondary | ICD-10-CM | POA: Diagnosis not present

## 2023-03-18 DIAGNOSIS — R079 Chest pain, unspecified: Secondary | ICD-10-CM | POA: Diagnosis present

## 2023-03-18 DIAGNOSIS — I159 Secondary hypertension, unspecified: Secondary | ICD-10-CM | POA: Diagnosis not present

## 2023-03-18 LAB — BASIC METABOLIC PANEL
Anion gap: 15 (ref 5–15)
BUN: 10 mg/dL (ref 8–23)
CO2: 20 mmol/L — ABNORMAL LOW (ref 22–32)
Calcium: 9.3 mg/dL (ref 8.9–10.3)
Chloride: 106 mmol/L (ref 98–111)
Creatinine, Ser: 0.69 mg/dL (ref 0.44–1.00)
GFR, Estimated: >60 mL/min (ref >60)
Glucose, Bld: 119 mg/dL — ABNORMAL HIGH (ref 70–99)
Potassium: 3.3 mmol/L — ABNORMAL LOW (ref 3.5–5.1)
Sodium: 141 mmol/L (ref 135–145)

## 2023-03-18 LAB — CBC
HCT: 38.4 % (ref 36.0–46.0)
Hemoglobin: 12.3 g/dL (ref 12.0–15.0)
MCH: 27.1 pg (ref 26.0–34.0)
MCHC: 32 g/dL (ref 30.0–36.0)
MCV: 84.6 fL (ref 80.0–100.0)
Platelets: 215 10*3/uL (ref 150–400)
RBC: 4.54 MIL/uL (ref 3.87–5.11)
RDW: 14.8 % (ref 11.5–15.5)
WBC: 6.2 10*3/uL (ref 4.0–10.5)
nRBC: 0 % (ref 0.0–0.2)

## 2023-03-18 LAB — TROPONIN I (HIGH SENSITIVITY)
Troponin I (High Sensitivity): 4 ng/L (ref <18)
Troponin I (High Sensitivity): 5 ng/L (ref <18)

## 2023-03-18 NOTE — ED Triage Notes (Signed)
"  I was at work yesterday and thought I needed the hospital, the pain yesterday extended down my left arm, some better today". No sob. No acute illness.

## 2023-03-18 NOTE — ED Triage Notes (Signed)
 Pt. Stated, I went to [facility] and they sent me down here cause I guess I had some chest pain yesterday but it went away.

## 2023-03-18 NOTE — ED Notes (Signed)
 AMA form signed

## 2023-03-18 NOTE — ED Provider Notes (Signed)
 [place] EMERGENCY DEPARTMENT AT [facility] Provider Note   CSN: [id number] Arrival date & time: 03/18/23  7829     History Chief Complaint  Patient presents with   Hypertension   Chest Pain    HPI Lydia Walsh is a 68 y.o. female presenting for chief complaint of chest pain elevated blood pressure reading.  Otherwise healthy 68 year old.  States that her blood pressure was elevated.  She had episode of chest pain yesterday at approximately 4 PM. It resolved spontaneously.  It was accompanied with lifting a heavy bag. No history of similar.  Follows with cardiology for her blood pressure management.  Has been asymptomatic for approximately 24 hours.  Denies fevers chills nausea vomiting syncope shortness of breath.  Patient's recorded medical, surgical, social, medication list and allergies were reviewed in the Snapshot window as part of the initial history.   Review of Systems   Review of Systems  Constitutional:  Negative for chills and fever.  HENT:  Negative for ear pain and sore throat.   Eyes:  Negative for pain and visual disturbance.  Respiratory:  Negative for cough and shortness of breath.   Cardiovascular:  Negative for chest pain and palpitations.  Gastrointestinal:  Negative for abdominal pain and vomiting.  Genitourinary:  Negative for dysuria and hematuria.  Musculoskeletal:  Negative for arthralgias and back pain.  Skin:  Negative for color change and rash.  Neurological:  Negative for seizures and syncope.  All other systems reviewed and are negative.   Physical Exam Updated Vital Signs BP 136/72   Pulse 63   Temp 98.2 F (36.8 C) (Oral)   Resp 18   LMP 07/16/2014   SpO2 99%  Physical Exam Constitutional:      General: She is not in acute distress.    Appearance: She is not ill-appearing or toxic-appearing.  HENT:     Head: Normocephalic and atraumatic.  Eyes:     Extraocular Movements: Extraocular movements intact.     Pupils:  Pupils are equal, round, and reactive to light.  Cardiovascular:     Rate and Rhythm: Normal rate.  Pulmonary:     Effort: No respiratory distress.  Abdominal:     General: Abdomen is flat.  Musculoskeletal:        General: No swelling, deformity or signs of injury.     Cervical back: Normal range of motion. No rigidity.  Skin:    General: Skin is warm and dry.  Neurological:     General: No focal deficit present.     Mental Status: She is alert and oriented to person, place, and time.  Psychiatric:        Mood and Affect: Mood normal.      ED Course/ Medical Decision Making/ A&P    Procedures Procedures   Medications Ordered in ED Medications - No data to display Medical Decision Making: Lydia Walsh is a 68 y.o. female who presented to the ED today with chest pain, detailed above.  Based on patient's comorbidities, patient has a heart score of 4.    Patient placed on continuous vitals and telemetry monitoring while in ED which was reviewed periodically.  Complete initial physical exam performed, notably the patient was HDS in NAD.   Reviewed and confirmed nursing documentation for past medical history, family history, social history.    Initial Assessment: With the patient's presentation of left-sided chest pain, most likely diagnosis is musculoskeletal chest pain versus GERD, although ACS remains on  the differential. Other diagnoses were considered including (but not limited to) pulmonary embolism, community-acquired pneumonia, aortic dissection, pneumothorax, underlying bony abnormality, anemia. These are considered less likely due to history of present illness and physical exam findings.    In particular, concerning pulmonary embolism: deny malignancy, recent surgery, history of DVT, or calf tenderness leading to a LOW risk Wells score. Aortic Dissection also reconsidered but seems less likely based on the location, quality, onset, and severity of symptoms in this case.  Patient has a lack of serious comorbidities for this condition including a lack of HTN or Smoking. Patient also has a lack of underlying history of AD or TAA.  This is most consistent with an acute life/limb threatening illness complicated by underlying chronic conditions.   Initial Plan: Evaluate for ACS with delta troponin and EKG evaluated as below  Evaluate for dissection, bony abnormality, or pneumonia with chest x-ray and screening laboratory evaluation including CBC, BMP  Further evaluation for pulmonary embolism not indicated at this time based on patient's PERC and Wells score.  Further evaluation for Thoracic Aortic Dissection not indicated at this time based on patient's clinical history and PE findings.   Initial Study Results: EKG was reviewed independently. Rate, rhythm, axis, intervals all examined and without medically relevant abnormality. ST segments without concerns for elevations.    Laboratory  Delta troponin demonstrated NAA   CBC and BMP without obvious metabolic or inflammatory abnormalities requiring further evaluation   Radiology  DG Chest 2 View Result Date: 03/18/2023 CLINICAL DATA:  Chest pain EXAM: CHEST - 2 VIEW COMPARISON:  04/25/2015 FINDINGS: Heart and mediastinal contours are within normal limits. No focal opacities or effusions. No acute bony abnormality. Aortic atherosclerosis. IMPRESSION: No active cardiopulmonary disease. Electronically Signed   By: Charlett Nose M.D.   On: 03/18/2023 11:52    Final Assessment and Plan: Patient's history of present illness and physical exam findings not consistent with any acute pathology. She is ambulatory tolerating p.o. intake and symptoms have been resolved for approximately 24 hours. She is stable for outpatient care management follow-up with her cardiology.   Disposition:  I have considered need for hospitalization, however, considering all of the above, I believe this patient is stable for discharge at this  time.  Patient/family educated about specific return precautions for given chief complaint and symptoms.  Patient/family educated about follow-up with PCP.     Patient/family expressed understanding of return precautions and need for follow-up. Patient spoken to regarding all imaging and laboratory results and appropriate follow up for these results. All education provided in verbal form with additional information in written form. Time was allowed for answering of patient questions. Patient discharged.    Emergency Department Medication Summary:   Medications - No data to display        Clinical Impression:  1. Secondary hypertension      Discharge   Final Clinical Impression(s) / ED Diagnoses Final diagnoses:  Secondary hypertension    Rx / DC Orders ED Discharge Orders     None         Glyn Ade, MD 03/18/23 1540

## 2023-03-18 NOTE — ED Provider Notes (Signed)
 [facility]    CSN: [id number] Arrival date & time: 03/18/23  0813      History   Chief Complaint Chief Complaint  Patient presents with   Chest Pain    HPI Lydia Walsh is a 68 y.o. female.   Patient here today for evaluation of chest pain that radiated down her left arm that started yesterday.  She reports that pain was an 8 out of 10 yesterday.  She reports she has not had significant pain today but feels as if she is having a "fluttering" of her heart at times.  She denies any shortness of breath.  She has not had any diaphoresis or lightheadedness.  She has not had nausea or vomiting.  She does not have personal history of cardiac issues but does have significant family history including her eldest son and mother.  She does have hypertension at baseline.  The history is provided by the patient.  Chest Pain Associated symptoms: no abdominal pain, no diaphoresis, no dizziness, no fever, no nausea, no shortness of breath and no vomiting     Past Medical History:  Diagnosis Date   Anemia    Back pain    Endometrial cancer (HCC)    Endometrial polyp    GERD (gastroesophageal reflux disease)    Gout    Heart murmur    Heart palpitations    History of blood transfusion    History of chemotherapy    History of radiation therapy 09/02/2015-10/24/2015   Endometrium 10/15/15- 10/24/15 HDR Treatment, 09/02/15-10/08/15 50 Gy pelvic radiation; Dr. Antony Blackbird   History of uterine fibroid    Hyperlipidemia    Hypertension    Hypothyroidism    IBS (irritable bowel syndrome)    Joint pain    OSA (obstructive sleep apnea)    moderate OSA per study 06-21-2005--  per pt does need cpap anymore never purchased it   Osteoarthritis    Osteoporosis 02/23/2022   Patient has confirmed diagnosis of osteoporosis with DEXA scan done a few months ago.  Alendronate was sent in, however patient did not receive it.  Could be secondary to older age, as well as cancer treatment years  ago.  Also, vitamin D levels at low normal at 34.  Given osteoporosis confirmed, will also supplement with vitamin D.  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   Restless leg syndrome 06/01/2016   Hypokalemia 07/24/2015   [employer] Federation 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) 07/10/2015   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HCC) 07/10/2015   Renal mass, left 06/21/2015   Nephrolithiasis 06/21/2015   Obesity (BMI 30-39.9) 06/21/2015   Iron deficiency anemia due to chronic blood loss 06/21/2015   Endometrial cancer (HCC) 03/29/2015   Major depression 06/02/2013   Fatigue 05/02/2013   Screening mammogram for breast cancer  08/19/2012   Osteoarthritis    LOW BACK PAIN, CHRONIC 02/27/2008   GOUT 11/24/2007   ALLERGIC RHINITIS, SEASONAL 03/09/2006   G E R D 02/19/2006   Hypothyroidism 02/17/2006   Dyslipidemia 02/17/2006   Essential hypertension 02/17/2006   OBSTRUCTIVE SLEEP APNEA 06/21/2005    Past Surgical History:  Procedure Laterality Date   ABDOMINAL HYSTERECTOMY     BREAST EXCISIONAL BIOPSY Right    benign   CHOLECYSTECTOMY     CHOLECYSTECTOMY, LAPAROSCOPIC  2020   COLONOSCOPY     DILATATION & CURETTAGE/HYSTEROSCOPY WITH MYOSURE N/A 03/07/2015   Procedure: DILATATION & CURETTAGE/HYSTEROSCOPY WITH MYOSURE (POLYP);  Surgeon: Richardean Chimera, MD;   Location: [facility];  Service: Gynecology;  Laterality: N/A;   DILATATION & CURRETTAGE/HYSTEROSCOPY WITH RESECTOCOPE  12-13-2002  &  09-26-2004   polyp/  submucosal fibroid   ESOPHAGOGASTRODUODENOSCOPY     EXCISION BENIGN RIGHT BREAST MASS  age 1   IR REMOVAL TUN ACCESS W/ PORT W/O FL MOD SED  07/17/2016   KNEE ARTHROSCOPY W/ MENISCECTOMY Bilateral right 04-12-2006//  left 12-29-2006   and Chondroplasty   port placement     ROBOTIC ASSISTED TOTAL HYSTERECTOMY WITH BILATERAL SALPINGO OOPHERECTOMY Bilateral 05/02/2015   Procedure: XI ROBOTIC ASSISTED TOTAL HYSTERECTOMY WITH BILATERAL SALPINGO OOPHORECTOMY AND SENTINAL LYMPH NODE BIOPSY;  Surgeon: Laurette Schimke, MD;  Location: [facility];  Service: Gynecology;  Laterality: Bilateral;   TRANSTHORACIC ECHOCARDIOGRAM  04-02-2010   normal LV, ef 65%   TUBAL LIGATION  1980's    OB History     Gravida  3   Para  3   Term      Preterm      AB      Living         SAB      IAB      Ectopic      Multiple      Live Births               Home Medications    Prior to Admission medications   Medication Sig Start Date End Date Taking? Authorizing Provider  amLODipine (NORVASC) 10 MG tablet Take 1 tablet by mouth once daily 09/03/22  Yes Nooruddin, Jason Fila, MD  levothyroxine (SYNTHROID) 125 MCG tablet Take 1 tablet by mouth once daily 03/08/23  Yes Nooruddin, Jason Fila, MD  metoprolol succinate (TOPROL-XL) 50 MG 24 hr tablet TAKE 3 TABLETS BY MOUTH ONCE DAILY WITH MEALS 09/03/22  Yes Nooruddin, Jason Fila, MD  omeprazole (PRILOSEC) 40 MG capsule Take 1 capsule (40 mg total) by mouth daily. 06/29/22  Yes Steffanie Rainwater, MD  rosuvastatin (CRESTOR) 40 MG tablet Take 1 tablet by mouth once daily 03/08/23  Yes Runell Gess, MD  spironolactone (ALDACTONE) 25 MG tablet Take 3 tablets (75 mg total) by mouth daily. 03/11/23  Yes Kathleen Lime, MD  allopurinol (ZYLOPRIM) 100 MG tablet Take 1 tablet by mouth once daily 03/08/23    Nooruddin, Jason Fila, MD  ezetimibe (ZETIA) 10 MG tablet Take 1 tablet by mouth once daily 06/24/22   Runell Gess, MD  fluticasone (FLONASE) 50 MCG/ACT nasal spray USE 1 TO 2 SPRAY(S) IN EACH NOSTRIL ONCE DAILY AS NEEDED FOR  TREATMENT  OF  ALLERGIES 03/21/22   Nooruddin, Jason Fila, MD  Multiple Vitamins-Minerals (CENTRUM PO) Take by mouth.    [provider]  nitrofurantoin, macrocrystal-monohydrate, (MACROBID) 100 MG capsule Take 1 capsule (100 mg total) by mouth 2 (two) times daily for 7 days. 03/11/23 03/18/23  Kathleen Lime, MD  valsartan (DIOVAN) 80 MG tablet Take 1 tablet (80 mg total) by mouth daily. 01/28/23 01/28/24  Faith Rogue, DO  esomeprazole (NEXIUM) 20 MG capsule Take 2 capsules (40 mg total) by mouth daily before breakfast. 10/15/10 04/08/11  Almyra Deforest, MD    Family History Family History  Problem Relation Age of Onset   Hypertension Mother    Heart failure Mother    Sudden death Mother    Stroke Mother    Cirrhosis Father        alcoholic   Liver disease Father    Alcoholism Father    Heart disease Son    Hypertension Son    Colon cancer Neg Hx    Stomach cancer Neg Hx    Rectal cancer Neg Hx    Esophageal cancer Neg Hx    Liver cancer Neg Hx     Social History Social History   Tobacco Use   Smoking status: Former    Current packs/day: 0.00    Types: Cigarettes    Start date: 02/29/1992    Quit date: 02/28/1994    Years since quitting: 29.0   Smokeless tobacco: Never  Vaping Use   Vaping status: Never Used  Substance Use Topics   Alcohol use: No    Alcohol/week: 0.0 standard drinks of alcohol   Drug use: No     Allergies   Shrimp (diagnostic) and Hctz [hydrochlorothiazide]   Review of Systems Review of Systems  Constitutional:  Negative for chills, diaphoresis and fever.  Eyes:  Negative for discharge and redness.  Respiratory:  Negative for shortness of breath.   Cardiovascular:  Positive for chest pain.  Gastrointestinal:  Negative for  abdominal pain, nausea and vomiting.  Neurological:  Negative for dizziness and light-headedness.     Physical Exam Triage Vital Signs ED Triage Vitals  Encounter Vitals Group     BP 03/18/23 0826 (!) 173/89     Systolic BP Percentile --      Diastolic BP Percentile --      Pulse Rate 03/18/23 0826 73     Resp 03/18/23 0826 18     Temp 03/18/23 0826 97.8 F (36.6 C)     Temp Source 03/18/23 0826 Oral     SpO2 03/18/23 0826 96 %     Weight 03/18/23 0826 182 lb 8.7 oz (82.8 kg)     Height 03/18/23 0826 5' (1.524 m)     Head Circumference --      Peak Flow --      Pain Score 03/18/23 0822 8     Pain Loc --      Pain Education --      Exclude from Growth Chart --    No data found.  Updated Vital Signs BP (!) 173/89 (BP Location: Right Arm)   Pulse 73   Temp 97.8 F (36.6 C) (Oral)   Resp 18   Ht 5' (1.524 m)   Wt 182 lb 8.7 oz (82.8 kg)   LMP 07/16/2014   SpO2 96%   BMI 35.65 kg/m   Visual Acuity Right Eye Distance:   Left Eye Distance:   Bilateral Distance:    Right Eye Near:   Left Eye Near:    Bilateral Near:     Physical Exam Vitals and nursing note reviewed.  Constitutional:      General: She is not in acute distress.    Appearance: Normal appearance. She is not ill-appearing.  HENT:  Head: Normocephalic and atraumatic.  Eyes:     Conjunctiva/sclera: Conjunctivae normal.  Cardiovascular:     Rate and Rhythm: Normal rate and regular rhythm.  Pulmonary:     Effort: Pulmonary effort is normal. No respiratory distress.     Breath sounds: Normal breath sounds. No wheezing, rhonchi or rales.  Neurological:     Mental Status: She is alert.  Psychiatric:        Mood and Affect: Mood normal.        Behavior: Behavior normal.        Thought Content: Thought content normal.      UC Treatments / Results  Labs (all labs ordered are listed, but only abnormal results are displayed) Labs Reviewed - No data to display  EKG   Radiology No results  found.  Procedures Procedures (including critical care time)  Medications Ordered in UC Medications - No data to display  Initial Impression / Assessment and Plan / UC Course  I have reviewed the triage vital signs and the nursing notes.  Pertinent labs & imaging results that were available during my care of the patient were reviewed by me and considered in my medical decision making (see chart for details).    EKG without changes compared to prior.  Regardless recommended further evaluation in the emergency room for stat troponins and further cardiac rule out.  Patient is agreeable to same but reports she needs to transport herself via POV.  Advised against this and patient signed AMA.  Final Clinical Impressions(s) / UC Diagnoses   Final diagnoses:  Chest pain, unspecified type     Discharge Instructions       Please report to Emergency Dept ASAP.     ED Prescriptions   None    PDMP not reviewed this encounter.   Tomi Bamberger, PA-C 03/18/23 [phone]

## 2023-03-18 NOTE — Discharge Instructions (Signed)
  Please report to Emergency Dept ASAP.

## 2023-03-18 NOTE — ED Notes (Signed)
 Patient is being discharged from the Urgent Care and sent to the Emergency Department via Private Vehicle . Per Provider, R. Izola Price PA, patient is in need of higher level of care due to Chest Pain (Labs Needed), AMA form signed. Patient is aware and verbalizes understanding of plan of care.  Vitals:   03/18/23 0826  BP: (!) 173/89  Pulse: 73  Resp: 18  Temp: 97.8 F (36.6 C)  SpO2: 96%

## 2023-03-19 ENCOUNTER — Ambulatory Visit (HOSPITAL_COMMUNITY): Admission: RE | Admit: 2023-03-19 | Source: Ambulatory Visit

## 2023-03-20 ENCOUNTER — Other Ambulatory Visit: Payer: Self-pay | Admitting: Student

## 2023-03-22 ENCOUNTER — Ambulatory Visit: Payer: Medicare Other

## 2023-03-22 NOTE — Telephone Encounter (Signed)
 Medication sent to pharmacy

## 2023-04-05 ENCOUNTER — Telehealth: Payer: Self-pay | Admitting: *Deleted

## 2023-04-05 NOTE — Telephone Encounter (Signed)
 Mammogram appointment 03/22/2023 @ 8:10 am (patient CANCELED-per office) the breast center stated patient called and canceled that she was in the hospital and that she would call back to reschedule. I called patient left voice message for this patient to contact the breast center to reschedule  the appointment / to contact them at  [phone].

## 2023-04-15 ENCOUNTER — Telehealth: Payer: Self-pay | Admitting: *Deleted

## 2023-04-15 NOTE — Telephone Encounter (Signed)
 Mammogram appointment 05/05/2023 @ 9:50 am / appointment scheduled  by patient.

## 2023-05-03 ENCOUNTER — Telehealth: Payer: Self-pay | Admitting: Student

## 2023-05-03 NOTE — Telephone Encounter (Signed)
 Please refer to message below.  Copied from CRM [id number]. Topic: Appointments - Appointment Cancel/Reschedule >> May 03, 2023  1:48 PM Jayson Michael wrote: Patient wants to reschedule AWV 05/05/23 to next day 05/06/2023, when I tried to reschedule it showed the next day as 08/04/2023. Patient would like a callback to see if she can have it moved because it runs into her appointment time at the breast center   patient callback [phone]

## 2023-05-04 NOTE — Telephone Encounter (Signed)
 I left a message letting patient know AWV was rescheduled to 08/11/2023 @ 11/50.

## 2023-05-05 ENCOUNTER — Ambulatory Visit
Admission: RE | Admit: 2023-05-05 | Discharge: 2023-05-05 | Disposition: A | Discharge status: Home / Self Care | Source: Ambulatory Visit | Attending: Internal Medicine

## 2023-05-05 DIAGNOSIS — Z1231 Encounter for screening mammogram for malignant neoplasm of breast: Secondary | ICD-10-CM

## 2023-05-15 ENCOUNTER — Other Ambulatory Visit: Payer: Self-pay | Admitting: Cardiovascular Disease

## 2023-05-16 ENCOUNTER — Other Ambulatory Visit: Payer: Self-pay | Admitting: Student

## 2023-05-17 NOTE — Telephone Encounter (Signed)
 Medication sent to pharmacy

## 2023-05-27 ENCOUNTER — Other Ambulatory Visit: Payer: Self-pay | Admitting: Student

## 2023-05-27 DIAGNOSIS — K219 Gastro-esophageal reflux disease without esophagitis: Secondary | ICD-10-CM

## 2023-05-27 NOTE — Telephone Encounter (Signed)
 Copied from CRM [id number]. Topic: Clinical - Medication Refill >> May 27, 2023  9:10 AM Lenon Radar A wrote: Medication: omeprazole  (PRILOSEC) 40 MG capsule  Has the patient contacted their pharmacy? Yes (Agent: If no, request that the patient contact the pharmacy for the refill. If patient does not wish to contact the pharmacy document the reason why and proceed with request.) (Agent: If yes, when and what did the pharmacy advise?)  Patient called pharmacy and was advised that they have not heard from provider for refill and to give us  a call. I advised patient that we did not receive refill request. Advised of 3 business day timeframe and advised that the refill request will be submitted for her today. Patient is out of medication.   This is the patient's preferred pharmacy:  [facility] [address] ([address] [place] [address] [place] ([place]) [place] [address] Phone: [phone] Fax: [phone]  Is this the correct pharmacy for this prescription? Yes If no, delete pharmacy and type the correct one.   Has the prescription been filled recently? No  Is the patient out of the medication? Yes  Has the patient been seen for an appointment in the last year OR does the patient have an upcoming appointment? Yes  Can we respond through MyChart? Yes  Agent: Please be advised that Rx refills may take up to 3 business days. We ask that you follow-up with your pharmacy.

## 2023-05-31 ENCOUNTER — Other Ambulatory Visit: Payer: Self-pay

## 2023-05-31 DIAGNOSIS — K219 Gastro-esophageal reflux disease without esophagitis: Secondary | ICD-10-CM

## 2023-05-31 MED ORDER — OMEPRAZOLE 40 MG PO CPDR: 40.0000 mg | DELAYED_RELEASE_CAPSULE | Freq: Every day | ORAL | 1 refills | Status: DC

## 2023-05-31 NOTE — Telephone Encounter (Signed)
 Medication sent to pharmacy

## 2023-06-24 ENCOUNTER — Emergency Department (HOSPITAL_COMMUNITY)

## 2023-06-24 ENCOUNTER — Other Ambulatory Visit: Payer: Self-pay

## 2023-06-24 ENCOUNTER — Emergency Department (HOSPITAL_COMMUNITY)
Admission: EM | Admit: 2023-06-24 | Discharge: 2023-06-24 | Disposition: A | Discharge status: Home / Self Care | Attending: Emergency Medicine | Admitting: Emergency Medicine

## 2023-06-24 DIAGNOSIS — E860 Dehydration: Secondary | ICD-10-CM | POA: Diagnosis not present

## 2023-06-24 DIAGNOSIS — R197 Diarrhea, unspecified: Secondary | ICD-10-CM | POA: Diagnosis present

## 2023-06-24 DIAGNOSIS — R531 Weakness: Secondary | ICD-10-CM | POA: Diagnosis not present

## 2023-06-24 DIAGNOSIS — R109 Unspecified abdominal pain: Secondary | ICD-10-CM | POA: Diagnosis not present

## 2023-06-24 LAB — COMPREHENSIVE METABOLIC PANEL WITH GFR
ALT: 67 U/L — ABNORMAL HIGH (ref 0–44)
AST: 51 U/L — ABNORMAL HIGH (ref 15–41)
Albumin: 3.8 g/dL (ref 3.5–5.0)
Alkaline Phosphatase: 83 U/L (ref 38–126)
Anion gap: 10 (ref 5–15)
BUN: 21 mg/dL (ref 8–23)
CO2: 17 mmol/L — ABNORMAL LOW (ref 22–32)
Calcium: 9.5 mg/dL (ref 8.9–10.3)
Chloride: 107 mmol/L (ref 98–111)
Creatinine, Ser: 0.79 mg/dL (ref 0.44–1.00)
GFR, Estimated: >60 mL/min (ref >60)
Glucose, Bld: 126 mg/dL — ABNORMAL HIGH (ref 70–99)
Potassium: 4 mmol/L (ref 3.5–5.1)
Sodium: 134 mmol/L — ABNORMAL LOW (ref 135–145)
Total Bilirubin: 1.1 mg/dL (ref 0.0–1.2)
Total Protein: 7.3 g/dL (ref 6.5–8.1)

## 2023-06-24 LAB — URINALYSIS, ROUTINE W REFLEX MICROSCOPIC
Bilirubin Urine: NEGATIVE
Glucose, UA: NEGATIVE mg/dL
Hgb urine dipstick: NEGATIVE
Ketones, ur: 5 mg/dL — AB
Nitrite: NEGATIVE
Protein, ur: NEGATIVE mg/dL
Specific Gravity, Urine: 1.033 — ABNORMAL HIGH (ref 1.005–1.030)
pH: 5 (ref 5.0–8.0)

## 2023-06-24 LAB — CBC
HCT: 42 % (ref 36.0–46.0)
Hemoglobin: 13.8 g/dL (ref 12.0–15.0)
MCH: 28.9 pg (ref 26.0–34.0)
MCHC: 32.9 g/dL (ref 30.0–36.0)
MCV: 87.9 fL (ref 80.0–100.0)
Platelets: 184 10*3/uL (ref 150–400)
RBC: 4.78 MIL/uL (ref 3.87–5.11)
RDW: 15.1 % (ref 11.5–15.5)
WBC: 7.7 10*3/uL (ref 4.0–10.5)
nRBC: 0 % (ref 0.0–0.2)

## 2023-06-24 LAB — LIPASE, BLOOD: Lipase: 27 U/L (ref 11–51)

## 2023-06-24 MED ORDER — SODIUM CHLORIDE 0.9 % IV SOLN: INTRAVENOUS | Status: DC

## 2023-06-24 MED ORDER — SODIUM CHLORIDE 0.9 % IV BOLUS
2000.0000 mL | Freq: Once | INTRAVENOUS | Status: AC
  Administered 2023-06-24: 2000 mL via INTRAVENOUS

## 2023-06-24 MED ORDER — DIPHENOXYLATE-ATROPINE 2.5-0.025 MG PO TABS: 1.0000 | ORAL_TABLET | Freq: Four times a day (QID) | ORAL | 0 refills | Status: DC | PRN

## 2023-06-24 MED ORDER — IOHEXOL 350 MG/ML SOLN
75.0000 mL | Freq: Once | INTRAVENOUS | Status: AC | PRN
  Administered 2023-06-24: 75 mL via INTRAVENOUS

## 2023-06-24 NOTE — ED Notes (Signed)
 This RN unable to obtain IV access, IV team consulted

## 2023-06-24 NOTE — ED Provider Notes (Addendum)
 [place] EMERGENCY DEPARTMENT AT [facility] Provider Note   CSN: [id number] Arrival date & time: 06/24/23  1237     Patient presents with: Diarrhea, Weakness, and Abdominal Pain   Lydia Walsh is a 68 y.o. female.   68 year old female who presents with diarrhea x 3 weeks.  States that she has had loose brown stools.  Denies any fever or chills.  Has had no emesis.  No recent travel or antibiotic use.  No dietary changes.  No prior history of same.  Denies a history of colitis.  Has been using over-the-counter medications without relief.  Has not been seen by her doctor.       Prior to Admission medications   Medication Sig Start Date End Date Taking? Authorizing Provider  allopurinol  (ZYLOPRIM ) 100 MG tablet Take 1 tablet by mouth once daily 03/08/23   Nooruddin, Saad, MD  amLODipine  (NORVASC ) 10 MG tablet Take 1 tablet by mouth once daily 09/03/22   Nooruddin, Saad, MD  ezetimibe  (ZETIA ) 10 MG tablet Take 1 tablet by mouth once daily 06/24/22   Avanell Leigh, MD  fluticasone  (FLONASE ) 50 MCG/ACT nasal spray PLACE 1 TO 2 SPRAYS IN EACH NOSTIL ONCE A DAY AS NEEDED FOR TREATMENT OF ALLERGIES 03/22/23   Nooruddin, Saad, MD  levothyroxine  (SYNTHROID ) 125 MCG tablet Take 1 tablet by mouth once daily 05/17/23   Nooruddin, Saad, MD  metoprolol  succinate (TOPROL -XL) 50 MG 24 hr tablet TAKE 3 TABLETS BY MOUTH ONCE DAILY WITH MEALS 03/22/23   Nooruddin, Saad, MD  Multiple Vitamins-Minerals (CENTRUM PO) Take by mouth.    [provider]  omeprazole  (PRILOSEC) 40 MG capsule Take 1 capsule (40 mg total) by mouth daily. 05/31/23   Nooruddin, Saad, MD  rosuvastatin  (CRESTOR ) 40 MG tablet Take 1 tablet by mouth once daily 05/18/23   Avanell Leigh, MD  spironolactone  (ALDACTONE ) 25 MG tablet Take 3 tablets (75 mg total) by mouth daily. 03/11/23   Amoako, Prince, MD  valsartan  (DIOVAN ) 80 MG tablet Take 1 tablet (80 mg total) by mouth daily. 01/28/23 01/28/24  Aurora Lees, DO   esomeprazole  (NEXIUM ) 20 MG capsule Take 2 capsules (40 mg total) by mouth daily before breakfast. 10/15/10 04/08/11  Illath, Jaseela, MD    Allergies: Shrimp (diagnostic) and Hctz [hydrochlorothiazide ]    Review of Systems  All other systems reviewed and are negative.   Updated Vital Signs BP 121/62 (BP Location: Left Arm)   Pulse 75   Temp 98.5 F (36.9 C)   Resp (!) 23   Ht 1.524 m (5')   Wt 79.4 kg   LMP 07/16/2014   SpO2 98%   BMI 34.18 kg/m   Physical Exam Vitals and nursing note reviewed.  Constitutional:      General: She is not in acute distress.    Appearance: Normal appearance. She is well-developed. She is not toxic-appearing.  HENT:     Head: Normocephalic and atraumatic.   Eyes:     General: Lids are normal.     Conjunctiva/sclera: Conjunctivae normal.     Pupils: Pupils are equal, round, and reactive to light.   Neck:     Thyroid : No thyroid  mass.     Trachea: No tracheal deviation.   Cardiovascular:     Rate and Rhythm: Normal rate and regular rhythm.     Heart sounds: Normal heart sounds. No murmur heard.    No gallop.  Pulmonary:     Effort: Pulmonary effort is normal. No  respiratory distress.     Breath sounds: Normal breath sounds. No stridor. No decreased breath sounds, wheezing, rhonchi or rales.  Abdominal:     General: There is no distension.     Palpations: Abdomen is soft.     Tenderness: There is no abdominal tenderness. There is no rebound.   Musculoskeletal:        General: No tenderness. Normal range of motion.     Cervical back: Normal range of motion and neck supple.   Skin:    General: Skin is warm and dry.     Findings: No abrasion or rash.   Neurological:     Mental Status: She is alert and oriented to person, place, and time. Mental status is at baseline.     GCS: GCS eye subscore is 4. GCS verbal subscore is 5. GCS motor subscore is 6.     Cranial Nerves: No cranial nerve deficit.     Sensory: No sensory deficit.      Motor: Motor function is intact.   Psychiatric:        Attention and Perception: Attention normal.        Speech: Speech normal.        Behavior: Behavior normal.     (all labs ordered are listed, but only abnormal results are displayed) Labs Reviewed  COMPREHENSIVE METABOLIC PANEL WITH GFR - Abnormal; Notable for the following components:      Result Value   Sodium 134 (*)    CO2 17 (*)    Glucose, Bld 126 (*)    AST 51 (*)    ALT 67 (*)    All other components within normal limits  LIPASE, BLOOD  CBC  URINALYSIS, ROUTINE W REFLEX MICROSCOPIC    EKG: None  Radiology: No results found.   Procedures   Medications Ordered in the ED  sodium chloride  0.9 % bolus 2,000 mL (has no administration in time range)  0.9 %  sodium chloride  infusion (has no administration in time range)                                    Medical Decision Making Amount and/or Complexity of Data Reviewed Labs: ordered. Radiology: ordered.  Risk Prescription drug management.  Patient clinically dehydrated and given IV fluids. Stool culture sent.  Labs are reassuring here.  Abdominal CT without acute findings.  Will discharge home with GI follow-up     Final diagnoses:  None    ED Discharge Orders     None          Lind Repine, MD 06/24/23 2138    Lind Repine, MD 06/24/23 2138

## 2023-06-24 NOTE — ED Notes (Signed)
 Confirmed with micro; received stool sample

## 2023-06-24 NOTE — ED Triage Notes (Signed)
 Pt. Stated, Ive had diarrhea for 3 weeks with stomach pain and making me feel weak.

## 2023-06-24 NOTE — ED Notes (Signed)
 Pt to CT

## 2023-06-29 LAB — STOOL CULTURE: E coli, Shiga toxin Assay: NEGATIVE

## 2023-06-29 LAB — STOOL CULTURE REFLEX - CMPCXR

## 2023-06-29 LAB — STOOL CULTURE REFLEX - RSASHR

## 2023-07-05 ENCOUNTER — Other Ambulatory Visit: Payer: Self-pay | Admitting: Student

## 2023-07-05 MED ORDER — ALLOPURINOL 100 MG PO TABS: 100.0000 mg | ORAL_TABLET | Freq: Every day | ORAL | 0 refills | Status: AC

## 2023-07-05 NOTE — Telephone Encounter (Signed)
 Medication sent to pharmacy

## 2023-07-05 NOTE — Telephone Encounter (Signed)
 Allopurinol  refilled today  Diphenoxylate  was prescribed by a provide that is not with imc.

## 2023-07-05 NOTE — Telephone Encounter (Signed)
 Copied from CRM [id number]. Topic: Clinical - Medication Refill >> Jul 05, 2023  9:26 AM Rosaria A wrote: Medication: allopurinol  (ZYLOPRIM ) 100 MG tablet diphenoxylate -atropine  (LOMOTIL ) 2.5-0.025 MG tablet    Has the patient contacted their pharmacy? Yes Pharmacy advised to reach out to PCP.   This is the patient's preferred pharmacy:  [facility] [address] ([address]),  [address] [place] ([place]) [place] [address] Phone: [phone] Fax: [phone]  Is this the correct pharmacy for this prescription? Yes   Has the prescription been filled recently? No  Is the patient out of the medication? Yes  Has the patient been seen for an appointment in the last year OR does the patient have an upcoming appointment? Yes  Can we respond through MyChart? No  Agent: Please be advised that Rx refills may take up to 3 business days. We ask that you follow-up with your pharmacy.

## 2023-07-07 ENCOUNTER — Ambulatory Visit: Payer: Self-pay

## 2023-07-07 NOTE — Telephone Encounter (Signed)
 FYI Only or Action Required?: Action required by provider: medication refill request.  Patient was last seen in primary care on 03/11/2023 by Renne Homans, MD. Called Nurse Triage reporting Diarrhea. Symptoms began ongoing since hospital stay. Interventions attempted: Prescription medications: Lomotil . Symptoms are: unchanged.  Triage Disposition: Home Care: will route to PCP to request medication refill  Patient/caregiver understands and will follow disposition?: Yes   Patient is contacting the office stating that even though the primary care provider has declined refilling the patient 's diphenoxylate -atropine  (LOMOTIL ) 2.5-0.025 MG tablet prescription. It is still needed due to her still experiencing diarrhea. Please contact patient back for advice.   Callback Number: [phone]  Reason for Disposition  SEVERE diarrhea (e.g., 7 or more times / day more than normal)  Answer Assessment - Initial Assessment Questions 1. DIARRHEA SEVERITY: How bad is the diarrhea? How many more stools have you had in the past 24 hours than normal?    - NO DIARRHEA (SCALE 0)   - MILD (SCALE 1-3): Few loose or mushy BMs; increase of 1-3 stools over normal daily number of stools; mild increase in ostomy output.   -  MODERATE (SCALE 4-7): Increase of 4-6 stools daily over normal; moderate increase in ostomy output.   -  SEVERE (SCALE 8-10; OR WORST POSSIBLE): Increase of 7 or more stools daily over normal; moderate increase in ostomy output; incontinence.     severe 2. ONSET: When did the diarrhea begin?      Ongoing since hospital stay 3. BM CONSISTENCY: How loose or watery is the diarrhea?      Watery, stool 4. VOMITING: Are you also vomiting? If Yes, ask: How many times in the past 24 hours?      no 5. ABDOMEN PAIN: Are you having any abdomen pain? If Yes, ask: What does it feel like? (e.g., crampy, dull, intermittent, constant)      none 6. ABDOMEN PAIN SEVERITY: If present, ask: How  bad is the pain?  (e.g., Scale 1-10; mild, moderate, or severe)   - MILD (1-3): doesn't interfere with normal activities, abdomen soft and not tender to touch    - MODERATE (4-7): interferes with normal activities or awakens from sleep, abdomen tender to touch    - SEVERE (8-10): excruciating pain, doubled over, unable to do any normal activities       In hospital  7. ORAL INTAKE: If vomiting, Have you been able to drink liquids? How much liquids have you had in the past 24 hours?     N/a 8. HYDRATION: Any signs of dehydration? (e.g., dry mouth [not just dry lips], too weak to stand, dizziness, new weight loss) When did you last urinate?     No s/s of dehydration & pt tastes constantly drinking fluids 9. EXPOSURE: Have you traveled to a foreign country recently? Have you been exposed to anyone with diarrhea? Could you have eaten any food that was spoiled?     N/a 10. ANTIBIOTIC USE: Are you taking antibiotics now or have you taken antibiotics in the past 2 months?       no 11. OTHER SYMPTOMS: Do you have any other symptoms? (e.g., fever, blood in stool)       nio 12. PREGNANCY: Is there any chance you are pregnant? When was your last menstrual period?       N/a  Pt was in the hospital and prescribed Lomotil  to help with diarrhea: medication is empty, pt still having diarrhea & pt would like  to request a refill on the Lomotil .  Protocols used: [facility]

## 2023-07-07 NOTE — Telephone Encounter (Signed)
 I talked to pt who stated the diarrhea is not as bad but continues and  she still needs Lomotil  refilled since she's trying to work. ANd she's going to the bathroom frequently. Advised pt to schedule an appt - appt has been scheduled 07/15/23. Pt wants to know if she can get a refill until the appt.

## 2023-07-15 ENCOUNTER — Ambulatory Visit

## 2023-07-15 VITALS — BP 123/77 | HR 77 | Ht 60.0 in | Wt 181.2 lb

## 2023-07-15 DIAGNOSIS — I10 Essential (primary) hypertension: Secondary | ICD-10-CM | POA: Diagnosis present

## 2023-07-15 DIAGNOSIS — R197 Diarrhea, unspecified: Secondary | ICD-10-CM | POA: Diagnosis not present

## 2023-07-15 DIAGNOSIS — E860 Dehydration: Secondary | ICD-10-CM | POA: Diagnosis not present

## 2023-07-15 DIAGNOSIS — D170 Benign lipomatous neoplasm of skin and subcutaneous tissue of head, face and neck: Secondary | ICD-10-CM | POA: Diagnosis not present

## 2023-07-15 NOTE — Patient Instructions (Addendum)
 Lydia Walsh,  It was a pleasure meeting you today. As we discussed, I would like for you to increase your fiber intake. I have provided a handout for you with this information, as well as Pedialyte when you need to rehydrate. You may take over the counter Imodium  (generic: loperamide ) as needed for loose stools. We will refer you for your forehead cyst. Please continue to take your blood pressure medications as prescribed, and keep a BP log to bring to your next visit.   - Dr. Sonnet Rizor

## 2023-07-15 NOTE — Assessment & Plan Note (Addendum)
 Freely mobile, soft mass on forehead that seems most consistent with a lipoma. Has been present for the past two years and is largely unchanged. No evidence of erythema or drainage. Generally non-tender, although she reports occasional mild tenderness of the area. She would like a referral to have this removed.  - referral to Plastics placed

## 2023-07-15 NOTE — Assessment & Plan Note (Signed)
 Patient presented to the ED on 6/12 with fatigue due to dehydration from watery diarrhea for 3 weeks prior. Following IV fluids and Limotil prescription she was discharged. She has not been taking any anti-diarrheals for the past 2 weeks, and reports moderate improvement of her diarrhea. She was experiencing Type VII stools on the [place] stool chart at time of ED presentation, and now experiencing Type V about twice a day at this point. Unclear what precipitated this bout as she denies other illness, symptoms, new medications, changes to diet or activity, has not traveled or been around anyone sick. Discussed oral rehydration with water  and Pedialyte as needed during episodes of diarrhea. Suggest she take OTC loperamide  as needed while her diarrhea resolves, but she does seem to be improving. She has poor fiber intake with her diet, so discussed foods to improve her fiber intake and provided her with handout.

## 2023-07-15 NOTE — Assessment & Plan Note (Signed)
 Patient with a history of hypertension who is prescribed Amlodipine  10 mg, Valsartan  80 mg, Spironolactone  25 mg daily, and Metoprolol  50 mg daily. She reports adherence with Amlodipine , Valsartan  and Metoprolol , but will occasionally skip Spironolactone  because she doesn't like the side effects which cause her to pee and feel as though her memory is worse. BP in office today 147/85, 140/80, and then 127/77 at the end of the visit on repeat. I have asked her to keep a detailed BP log with her home BP cuff, which she will bring to her follow up visit in 3 weeks. - BP log given to patient

## 2023-07-15 NOTE — Progress Notes (Signed)
 Established Patient Office Visit  Subjective   Patient ID: Lydia Walsh, female    DOB: 1955/12/05  Age: 68 y.o. MRN: [id number]  Chief Complaint  Patient presents with   Diarrhea    Was rx's Diphenoxylate  with atropine  in the ED-requesting refill    Skin Problem    Requesting referral to derm    Lydia Walsh is a 68 year old female with a history of hypertension, 1st deg AV block, hypothyroidism, and HLD, who presents to clinic today for follow up from ED visit on 6/12 due to diarrhea and dehydration.   Follow up Hospitalization  Patient presented to [facility] ED on 06/24/23. She was treated for diarrhea and dehydration. Treatment for this included IV fluids and anti-diarrheal of diphenoxylate -atropine . She reports excellent compliance with treatment. She reports this condition is improved.  When shown [place] Stool chart to patient in office, patient endorses Type VII stool at presentation to the ED, now Type V. She has been hydrating well with 80+ oz. of water  daily. She denies episodes of dizziness or fatigue since presenting to the ED.   Patient also presents with request to be referred for her forehead cyst. It has been present for multiple years and is largely unchanged for the past two.    ROS: Denies headaches, dizziness, fever, chills, runny nose, sore throat, vision changes, hearing changes, chest pain, shortness of breath, difficulty breathing, nausea, vomiting, abdominal pain. Denies increased urinary frequency, pain with urination, constipation. No recent falls. Has had improving episodes of diarrhea since the ED.   -----------------------------------------------------------------------------------------       Objective:     BP 123/77 (BP Location: Right Arm, Patient Position: Sitting, Cuff Size: Normal)   Pulse 77   Ht 5' (1.524 m)   Wt 181 lb 3.2 oz (82.2 kg)   LMP 07/16/2014   SpO2 98%   BMI 35.39 kg/m  BP Readings from Last 3 Encounters:  07/15/23  123/77  06/24/23 121/64  03/18/23 (!) 185/79      Physical Exam:   Constitutional: well-appearing female sitting in exam chair, in no acute distress. Ambulates without use of assistance device  HEENT: normocephalic atraumatic, mucous membranes moist Eyes: conjunctiva non-erythematous Left forehead: soft, solitary and mobile nodule without erythema or drainage, grossly unchanged from 2023 picture in chart     Cardiovascular: regular rate and rhythm, bilateral radial pulses 2+, brisk capillary refill bilateral hands  Pulmonary/Chest: normal work of breathing on room air, lungs clear to auscultation bilaterally Abdominal: soft, non-tender, non-distended MSK: normal bulk and tone. Neurological: alert & oriented x 3 Skin: warm and dry Psych: mood calm, behavior normal, thought content normal, judgement normal     No results found for any visits on 07/15/23.     The 10-year ASCVD risk score (Arnett DK, et al., 2019) is: 7.5%    Assessment & Plan:   Problem List Items Addressed This Visit       Cardiovascular and Mediastinum   Essential hypertension - Primary (Chronic)   Patient with a history of hypertension who is prescribed Amlodipine  10 mg, Valsartan  80 mg, Spironolactone  25 mg daily, and Metoprolol  50 mg daily. She reports adherence with Amlodipine , Valsartan  and Metoprolol , but will occasionally skip Spironolactone  because she doesn't like the side effects which cause her to pee and feel as though her memory is worse. BP in office today 147/85, 140/80, and then 127/77 at the end of the visit on repeat. I have asked her to keep a detailed  BP log with her home BP cuff, which she will bring to her follow up visit in 3 weeks. - BP log given to patient         Other   Lipoma of forehead   Freely mobile, soft mass on forehead that seems most consistent with a lipoma. Has been present for the past two years and is largely unchanged. No evidence of erythema or drainage.  Generally non-tender, although she reports occasional mild tenderness of the area. She would like a referral to have this removed.  - referral to Plastics placed      Relevant Orders   Ambulatory referral to Plastic Surgery   Diarrhea with dehydration   Patient presented to the ED on 6/12 with fatigue due to dehydration from watery diarrhea for 3 weeks prior. Following IV fluids and Limotil prescription she was discharged. She has not been taking any anti-diarrheals for the past 2 weeks, and reports moderate improvement of her diarrhea. She was experiencing Type VII stools on the [place] stool chart at time of ED presentation, and now experiencing Type V about twice a day at this point. Unclear what precipitated this bout as she denies other illness, symptoms, new medications, changes to diet or activity, has not traveled or been around anyone sick. Discussed oral rehydration with water  and Pedialyte as needed during episodes of diarrhea. Suggest she take OTC loperamide  as needed while her diarrhea resolves, but she does seem to be improving. She has poor fiber intake with her diet, so discussed foods to improve her fiber intake and provided her with handout.        Return in about 4 weeks (around 08/12/2023) for HTN.    Patient discussed with Dr. Narendra, who also saw and evaluated the patient.  Doyal Miyamoto, MD [facility] Internal Medicine  PGY-1  07/15/23, 11:47 AM

## 2023-07-16 NOTE — Progress Notes (Signed)
Internal Medicine Clinic Attending  I was physically present during the key portions of the resident provided service and participated in the medical decision making of patient's management care. I reviewed pertinent patient test results.  The assessment, diagnosis, and plan were formulated together and I agree with the documentation in the resident's note.  Narendra, Nischal, MD  

## 2023-07-23 ENCOUNTER — Other Ambulatory Visit: Payer: Self-pay | Admitting: Student

## 2023-07-23 NOTE — Telephone Encounter (Unsigned)
 Copied from CRM [id number]. Topic: Clinical - Medication Refill >> Jul 23, 2023  2:33 PM [name] ORN wrote: Medication:   valsartan  (DIOVAN ) 80 MG tablet       Has the patient contacted their pharmacy? Yes (Agent: If no, request that the patient contact the pharmacy for the refill. If patient does not wish to contact the pharmacy document the reason why and proceed with request.) (Agent: If yes, when and what did the pharmacy advise?)  This is the patient's preferred pharmacy:  [facility] [address] ([address] [place] [address] [place] ([place]) [place] [address] Phone: [phone] Fax: [phone]  Is this the correct pharmacy for this prescription? Yes If no, delete pharmacy and type the correct one.   Has the prescription been filled recently? No  Is the patient out of the medication? Yes  Has the patient been seen for an appointment in the last year OR does the patient have an upcoming appointment? Yes  Can we respond through MyChart? Yes  Agent: Please be advised that Rx refills may take up to 3 business days. We ask that you follow-up with your pharmacy.

## 2023-07-26 ENCOUNTER — Other Ambulatory Visit: Payer: Self-pay | Admitting: Student

## 2023-07-26 DIAGNOSIS — I10 Essential (primary) hypertension: Secondary | ICD-10-CM

## 2023-07-26 MED ORDER — VALSARTAN 80 MG PO TABS: 80.0000 mg | ORAL_TABLET | Freq: Every day | ORAL | 1 refills | Status: DC

## 2023-07-26 NOTE — Telephone Encounter (Signed)
 Unable to speak with the patient.  LMOM for the patient to contact the number listed below of the patient's referral:  [facility]   [facility] Plastic surgeon in [place], [place]    Located in: [facility] Address: [address], [place], [place] [address]   Phone: [phone]    Copied from CRM [id number]. Topic: Referral - Status >> Jul 23, 2023  2:37 PM [name] ORN wrote: Reason for CRM: Patient have a knot on her head which is a cyst head  a plastic surgeon contact patient on 07/22/23  , patient want to know why a plastic surgeon reach out to her when patient is requesting a referral or a dermatogist   Patient stated she is not accepting a plastic surgeon   Patient call back [phone]

## 2023-07-29 ENCOUNTER — Telehealth: Payer: Self-pay | Admitting: *Deleted

## 2023-07-29 NOTE — Telephone Encounter (Signed)
 Will forward to Dr. Nguyen. Copied from CRM [id number]. Topic: Referral - Question >> Jul 29, 2023  2:58 PM Mercer PEDLAR wrote: Reason for CRM: Patient would like a callback to let her know why her referral was sent to [facility] instead of a dermatologist.

## 2023-08-10 NOTE — Telephone Encounter (Signed)
 Patient referral was sen to the following office in which she can reach out to at the number listed below:  Type Date User Summary Attachment  General 07/19/2023  2:13 PM Shirlean Brunetta JULIAN[facility] -  Note: [facility] Plastic surgeon in [place], [place]    Located in: [facility] Address: [address], [place], [place] [address]   Phone: [phone] .  Copied from CRM [id number]. Topic: Referral - Status >> Aug 10, 2023  2:45 PM Farrel B wrote: Reason for CRM: [id number] called to request an update on the referral request. Patient stated she had spoken to some recently about a referral to see a plastic surgeon about a dent or scar on her forehead, she states it had been a while and she hadn't heard anything. Please call her to update her on the status

## 2023-08-11 ENCOUNTER — Encounter

## 2023-08-11 ENCOUNTER — Telehealth: Payer: Self-pay | Admitting: Student

## 2023-08-11 NOTE — Telephone Encounter (Signed)
 Message has been forwarded to the nurse who will be doing the AWV this am Roz Fuller to contact the patient.  Copied from CRM [id number]. Topic: Appointments - Scheduling Inquiry for Clinic >> Aug 10, 2023  2:49 PM Suzette B wrote: Reason for CRM: Patient has requested a call in regards to the AWV via phone between and 9 because patient has to go to work for the time the appt is schedule [phone]. I attempted to reschedule however the decision tree even after being cleared out would not give me the option to reschedule for another day or earlier. I also place a message in the appointment box.

## 2023-08-12 ENCOUNTER — Other Ambulatory Visit: Payer: Self-pay | Admitting: Student

## 2023-08-18 ENCOUNTER — Encounter

## 2023-08-21 ENCOUNTER — Other Ambulatory Visit: Payer: Self-pay

## 2023-08-21 ENCOUNTER — Encounter: Payer: Self-pay | Admitting: Emergency Medicine

## 2023-08-21 ENCOUNTER — Encounter (HOSPITAL_COMMUNITY): Payer: Self-pay

## 2023-08-21 ENCOUNTER — Ambulatory Visit
Admission: EM | Admit: 2023-08-21 | Discharge: 2023-08-21 | Disposition: A | Discharge status: Home / Self Care | Attending: Emergency Medicine | Admitting: Emergency Medicine

## 2023-08-21 ENCOUNTER — Observation Stay (HOSPITAL_COMMUNITY)
Admission: EM | Admit: 2023-08-21 | Discharge: 2023-08-22 | Disposition: A | Discharge status: Home / Self Care | Attending: Internal Medicine | Admitting: Internal Medicine

## 2023-08-21 DIAGNOSIS — K148 Other diseases of tongue: Secondary | ICD-10-CM | POA: Diagnosis present

## 2023-08-21 DIAGNOSIS — I10 Essential (primary) hypertension: Secondary | ICD-10-CM | POA: Diagnosis not present

## 2023-08-21 DIAGNOSIS — T7840XA Allergy, unspecified, initial encounter: Secondary | ICD-10-CM | POA: Diagnosis not present

## 2023-08-21 DIAGNOSIS — T783XXA Angioneurotic edema, initial encounter: Principal | ICD-10-CM | POA: Diagnosis present

## 2023-08-21 HISTORY — DX: Angioneurotic edema, initial encounter: T78.3XXA

## 2023-08-21 LAB — BASIC METABOLIC PANEL WITH GFR
Anion gap: 11 (ref 5–15)
BUN: 8 mg/dL (ref 8–23)
CO2: 23 mmol/L (ref 22–32)
Calcium: 9.9 mg/dL (ref 8.9–10.3)
Chloride: 106 mmol/L (ref 98–111)
Creatinine, Ser: 0.81 mg/dL (ref 0.44–1.00)
GFR, Estimated: >60 mL/min (ref >60)
Glucose, Bld: 119 mg/dL — ABNORMAL HIGH (ref 70–99)
Potassium: 4.5 mmol/L (ref 3.5–5.1)
Sodium: 140 mmol/L (ref 135–145)

## 2023-08-21 LAB — HEPATIC FUNCTION PANEL
ALT: 73 U/L — ABNORMAL HIGH (ref 0–44)
AST: 68 U/L — ABNORMAL HIGH (ref 15–41)
Albumin: 3.6 g/dL (ref 3.5–5.0)
Alkaline Phosphatase: 92 U/L (ref 38–126)
Bilirubin, Direct: 0.2 mg/dL (ref 0.0–0.2)
Indirect Bilirubin: 0.4 mg/dL (ref 0.3–0.9)
Total Bilirubin: 0.6 mg/dL (ref 0.0–1.2)
Total Protein: 7.1 g/dL (ref 6.5–8.1)

## 2023-08-21 LAB — CBC WITH DIFFERENTIAL/PLATELET
Abs Immature Granulocytes: 0.04 K/uL (ref 0.00–0.07)
Basophils Absolute: 0 K/uL (ref 0.0–0.1)
Basophils Relative: 1 %
Eosinophils Absolute: 0 K/uL (ref 0.0–0.5)
Eosinophils Relative: 1 %
HCT: 40.6 % (ref 36.0–46.0)
Hemoglobin: 12.6 g/dL (ref 12.0–15.0)
Immature Granulocytes: 1 %
Lymphocytes Relative: 12 %
Lymphs Abs: 0.7 K/uL (ref 0.7–4.0)
MCH: 28.9 pg (ref 26.0–34.0)
MCHC: 31 g/dL (ref 30.0–36.0)
MCV: 93.1 fL (ref 80.0–100.0)
Monocytes Absolute: 0.2 K/uL (ref 0.1–1.0)
Monocytes Relative: 3 %
Neutro Abs: 4.9 K/uL (ref 1.7–7.7)
Neutrophils Relative %: 82 %
Platelets: 172 K/uL (ref 150–400)
RBC: 4.36 MIL/uL (ref 3.87–5.11)
RDW: 13.6 % (ref 11.5–15.5)
WBC: 5.9 K/uL (ref 4.0–10.5)
nRBC: 0 % (ref 0.0–0.2)

## 2023-08-21 LAB — SEDIMENTATION RATE: Sed Rate: 35 mm/h — ABNORMAL HIGH (ref 0–22)

## 2023-08-21 LAB — HIV ANTIBODY (ROUTINE TESTING W REFLEX): HIV Screen 4th Generation wRfx: NONREACTIVE

## 2023-08-21 MED ORDER — ACETAMINOPHEN 325 MG PO TABS: 650.0000 mg | ORAL_TABLET | Freq: Four times a day (QID) | ORAL | Status: DC | PRN

## 2023-08-21 MED ORDER — LEVOTHYROXINE SODIUM 25 MCG PO TABS
125.0000 ug | ORAL_TABLET | Freq: Every day | ORAL | Status: DC
  Administered 2023-08-22: 125 ug via ORAL
  Filled 2023-08-21: qty 1

## 2023-08-21 MED ORDER — TRANEXAMIC ACID-NACL 1000-0.7 MG/100ML-% IV SOLN
1000.0000 mg | Freq: Once | INTRAVENOUS | Status: AC
  Administered 2023-08-21: 1000 mg via INTRAVENOUS
  Filled 2023-08-21: qty 100

## 2023-08-21 MED ORDER — EZETIMIBE 10 MG PO TABS
10.0000 mg | ORAL_TABLET | Freq: Every day | ORAL | Status: DC
  Administered 2023-08-21 – 2023-08-22 (×2): 10 mg via ORAL
  Filled 2023-08-21 (×2): qty 1

## 2023-08-21 MED ORDER — ALUM & MAG HYDROXIDE-SIMETH 200-200-20 MG/5ML PO SUSP
30.0000 mL | Freq: Once | ORAL | Status: AC
  Administered 2023-08-21: 30 mL via ORAL
  Filled 2023-08-21: qty 30

## 2023-08-21 MED ORDER — FAMOTIDINE IN NACL 20-0.9 MG/50ML-% IV SOLN
20.0000 mg | Freq: Once | INTRAVENOUS | Status: AC
  Administered 2023-08-21: 20 mg via INTRAVENOUS
  Filled 2023-08-21: qty 50

## 2023-08-21 MED ORDER — SPIRONOLACTONE 25 MG PO TABS
75.0000 mg | ORAL_TABLET | Freq: Every day | ORAL | Status: DC
  Administered 2023-08-21 – 2023-08-22 (×2): 75 mg via ORAL
  Filled 2023-08-21 (×2): qty 3

## 2023-08-21 MED ORDER — ALLOPURINOL 100 MG PO TABS: 100.0000 mg | ORAL_TABLET | Freq: Every day | ORAL | Status: DC

## 2023-08-21 MED ORDER — METHYLPREDNISOLONE SODIUM SUCC 40 MG IJ SOLR
40.0000 mg | Freq: Two times a day (BID) | INTRAMUSCULAR | Status: DC
  Administered 2023-08-21 – 2023-08-22 (×2): 40 mg via INTRAVENOUS
  Filled 2023-08-21 (×2): qty 1

## 2023-08-21 MED ORDER — AMLODIPINE BESYLATE 5 MG PO TABS
10.0000 mg | ORAL_TABLET | Freq: Every day | ORAL | Status: DC
  Administered 2023-08-21 – 2023-08-22 (×2): 10 mg via ORAL
  Filled 2023-08-21 (×2): qty 2

## 2023-08-21 MED ORDER — ACETAMINOPHEN 650 MG RE SUPP: 650.0000 mg | Freq: Four times a day (QID) | RECTAL | Status: DC | PRN

## 2023-08-21 MED ORDER — METOPROLOL SUCCINATE ER 25 MG PO TB24
150.0000 mg | ORAL_TABLET | Freq: Every day | ORAL | Status: DC
  Administered 2023-08-21 – 2023-08-22 (×2): 150 mg via ORAL
  Filled 2023-08-21 (×2): qty 2

## 2023-08-21 MED ORDER — ROSUVASTATIN CALCIUM 20 MG PO TABS
40.0000 mg | ORAL_TABLET | Freq: Every day | ORAL | Status: DC
  Administered 2023-08-21 – 2023-08-22 (×2): 40 mg via ORAL
  Filled 2023-08-21 (×2): qty 2

## 2023-08-21 MED ORDER — SODIUM CHLORIDE 0.9 % IV SOLN: Freq: Once | INTRAVENOUS | Status: AC

## 2023-08-21 MED ORDER — DIPHENHYDRAMINE HCL 50 MG/ML IJ SOLN
25.0000 mg | Freq: Once | INTRAMUSCULAR | Status: AC
  Administered 2023-08-21: 25 mg via INTRAVENOUS

## 2023-08-21 MED ORDER — METHYLPREDNISOLONE SODIUM SUCC 125 MG IJ SOLR
125.0000 mg | Freq: Once | INTRAMUSCULAR | Status: AC
  Administered 2023-08-21: 125 mg via INTRAVENOUS

## 2023-08-21 MED ORDER — DIPHENHYDRAMINE HCL 50 MG/ML IJ SOLN
12.5000 mg | Freq: Two times a day (BID) | INTRAMUSCULAR | Status: DC
  Administered 2023-08-21 – 2023-08-22 (×2): 12.5 mg via INTRAVENOUS
  Filled 2023-08-21 (×2): qty 1

## 2023-08-21 MED ORDER — SODIUM CHLORIDE 0.9% FLUSH
3.0000 mL | Freq: Two times a day (BID) | INTRAVENOUS | Status: DC
  Administered 2023-08-21 – 2023-08-22 (×3): 3 mL via INTRAVENOUS

## 2023-08-21 MED ORDER — FAMOTIDINE IN NACL 20-0.9 MG/50ML-% IV SOLN
20.0000 mg | Freq: Two times a day (BID) | INTRAVENOUS | Status: DC
  Administered 2023-08-21 – 2023-08-22 (×2): 20 mg via INTRAVENOUS
  Filled 2023-08-21 (×3): qty 50

## 2023-08-21 MED ORDER — DIPHENHYDRAMINE HCL 50 MG/ML IJ SOLN: 25.0000 mg | Freq: Once | INTRAMUSCULAR | Status: DC

## 2023-08-21 MED ORDER — HEPARIN SODIUM (PORCINE) 5000 UNIT/ML IJ SOLN
5000.0000 [IU] | Freq: Two times a day (BID) | INTRAMUSCULAR | Status: DC
  Administered 2023-08-21 – 2023-08-22 (×2): 5000 [IU] via SUBCUTANEOUS
  Filled 2023-08-21 (×2): qty 1

## 2023-08-21 NOTE — ED Provider Notes (Signed)
 [facility]    CSN: [id number] Arrival date & time: 08/21/23  0810      History   Chief Complaint Chief Complaint  Patient presents with   Oral Swelling    HPI Lydia Walsh is a 68 y.o. female.  Patient presents to the emergency department with concerns of allergic reaction.  Reportedly woke up this morning around 7 AM with significant oral swelling that developed.  Denies any recent exposure to any triggers as far she is aware.  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  She reportedly took ibuprofen  last night prior to going to bed and woke up this morning at 7 AM with tongue swelling.  States that she feels that she can breathe fine and has no difficulty swallowing.  No reported rash.  HPI  Past Medical History:  Diagnosis Date   Anemia    Back pain    Cystitis 03/11/2023   Cystitis, acute 03/11/2023   Endometrial cancer [facility])    Endometrial polyp    GERD (gastroesophageal reflux disease)    Gout    Heart murmur    Heart palpitations    History of blood transfusion    History of chemotherapy    History of radiation therapy 09/02/2015-10/24/2015   Endometrium 10/15/15- 10/24/15 HDR Treatment, 09/02/15-10/08/15 50 Gy pelvic radiation; Dr. Lynwood Nasuti   History of uterine fibroid    Hyperlipidemia    Hypertension    Hypothyroidism    IBS (irritable bowel syndrome)    Joint pain    OSA (obstructive sleep apnea)    moderate OSA per study 06-21-2005--  per pt does need cpap anymore never purchased it   Osteoarthritis    Osteoporosis 02/23/2022   Patient has confirmed diagnosis of osteoporosis with DEXA scan done a few months ago.  Alendronate  was sent in, however patient did not receive it.  Could be secondary to older age, as well as cancer treatment years ago.  Also, vitamin D  levels at low normal at 34.  Given osteoporosis confirmed, will also supplement with vitamin D .  Will patient is agreeable to starting alendronate ,  and have expla   Other fatigue    Other specified disorders of thyroid     PMB (postmenopausal bleeding)    Shortness of breath on exertion    Wears dentures    upper   Wears glasses     Patient Active Problem List   Diagnosis Date Noted   Lipoma of forehead 07/15/2023   Diarrhea with dehydration 07/15/2023   Swelling of lower extremity 05/21/2022   Osteoporosis 02/23/2022   Morbid obesity (HCC) 05/28/2020   Elevated coronary artery calcium  score 09/09/2018   Neck pain on right side 07/28/2018   Primary osteoarthritis of knees, bilateral 11/18/2016   AV block, 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   Restless leg syndrome 06/01/2016   Hypokalemia 07/24/2015   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) 07/10/2015   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HCC) 07/10/2015   Renal mass, left 06/21/2015   Nephrolithiasis 06/21/2015   Obesity (BMI 30-39.9) 06/21/2015   Iron deficiency anemia due to chronic blood loss 06/21/2015   Endometrial cancer (HCC) 03/29/2015   Major depression 06/02/2013   Fatigue 05/02/2013   Screening mammogram for breast cancer 08/19/2012   Osteoarthritis    LOW BACK PAIN, CHRONIC 02/27/2008   GOUT 11/24/2007   ALLERGIC RHINITIS, SEASONAL 03/09/2006   G E R D 02/19/2006   Hypothyroidism 02/17/2006  Dyslipidemia 02/17/2006   Essential hypertension 02/17/2006   OBSTRUCTIVE SLEEP APNEA 06/21/2005    Past Surgical History:  Procedure Laterality Date   ABDOMINAL HYSTERECTOMY     BREAST EXCISIONAL BIOPSY Right    benign   CHOLECYSTECTOMY     CHOLECYSTECTOMY, LAPAROSCOPIC  2020   COLONOSCOPY     DILATATION & CURETTAGE/HYSTEROSCOPY WITH MYOSURE N/A 03/07/2015   Procedure: DILATATION & CURETTAGE/HYSTEROSCOPY WITH MYOSURE (POLYP);  Surgeon: Norleen Skill, MD;  Location: [facility];  Service: Gynecology;  Laterality: N/A;   DILATATION & CURRETTAGE/HYSTEROSCOPY WITH RESECTOCOPE  12-13-2002  &   09-26-2004   polyp/  submucosal fibroid   ESOPHAGOGASTRODUODENOSCOPY     EXCISION BENIGN RIGHT BREAST MASS  age 74   IR REMOVAL TUN ACCESS W/ PORT W/O FL MOD SED  07/17/2016   KNEE ARTHROSCOPY W/ MENISCECTOMY Bilateral right 04-12-2006//  left 12-29-2006   and Chondroplasty   port placement     ROBOTIC ASSISTED TOTAL HYSTERECTOMY WITH BILATERAL SALPINGO OOPHERECTOMY Bilateral 05/02/2015   Procedure: XI ROBOTIC ASSISTED TOTAL HYSTERECTOMY WITH BILATERAL SALPINGO OOPHORECTOMY AND SENTINAL LYMPH NODE BIOPSY;  Surgeon: Sari Bachelor, MD;  Location: [facility];  Service: Gynecology;  Laterality: Bilateral;   TRANSTHORACIC ECHOCARDIOGRAM  04-02-2010   normal LV, ef 65%   TUBAL LIGATION  1980's    OB History     Gravida  3   Para  3   Term      Preterm      AB      Living         SAB      IAB      Ectopic      Multiple      Live Births               Home Medications    Prior to Admission medications   Medication Sig Start Date End Date Taking? Authorizing Provider  allopurinol  (ZYLOPRIM ) 100 MG tablet Take 1 tablet (100 mg total) by mouth daily. 07/05/23   Nooruddin, Saad, MD  amLODipine  (NORVASC ) 10 MG tablet Take 1 tablet by mouth once daily 07/27/23   Nooruddin, Saad, MD  ezetimibe  (ZETIA ) 10 MG tablet Take 1 tablet by mouth once daily 06/24/22   Court Dorn PARAS, MD  fluticasone  (FLONASE ) 50 MCG/ACT nasal spray PLACE 1 TO 2 SPRAYS IN EACH NOSTIL ONCE A DAY AS NEEDED FOR TREATMENT OF ALLERGIES 03/22/23   Nooruddin, Saad, MD  levothyroxine  (SYNTHROID ) 125 MCG tablet Take 1 tablet by mouth once daily 08/13/23   Nooruddin, Saad, MD  metoprolol  succinate (TOPROL -XL) 50 MG 24 hr tablet TAKE 3 TABLETS BY MOUTH ONCE DAILY WITH MEALS 03/22/23   Nooruddin, Saad, MD  Multiple Vitamins-Minerals (CENTRUM PO) Take by mouth.    [provider]  omeprazole  (PRILOSEC) 40 MG capsule Take 1 capsule (40 mg total) by mouth daily. 05/31/23   Nooruddin, Saad, MD  rosuvastatin  (CRESTOR )  40 MG tablet Take 1 tablet by mouth once daily 05/18/23   Court Dorn PARAS, MD  spironolactone  (ALDACTONE ) 25 MG tablet Take 3 tablets (75 mg total) by mouth daily. 03/11/23   Amoako, Prince, MD  valsartan  (DIOVAN ) 80 MG tablet Take 1 tablet (80 mg total) by mouth daily. 07/26/23 07/25/24  Nooruddin, Saad, MD  esomeprazole  (NEXIUM ) 20 MG capsule Take 2 capsules (40 mg total) by mouth daily before breakfast. 10/15/10 04/08/11  Illath, Jaseela, MD    Family History Family History  Problem Relation Age of Onset   Hypertension Mother  Heart failure Mother    Sudden death Mother    Stroke Mother    Cirrhosis Father        alcoholic   Liver disease Father    Alcoholism Father    Heart disease Son    Hypertension Son    Colon cancer Neg Hx    Stomach cancer Neg Hx    Rectal cancer Neg Hx    Esophageal cancer Neg Hx    Liver cancer Neg Hx     Social History Social History   Tobacco Use   Smoking status: Former    Current packs/day: 0.00    Types: Cigarettes    Start date: 02/29/1992    Quit date: 02/28/1994    Years since quitting: 29.4    Passive exposure: Past   Smokeless tobacco: Never  Vaping Use   Vaping status: Never Used  Substance Use Topics   Alcohol use: No    Alcohol/week: 0.0 standard drinks of alcohol   Drug use: No     Allergies   Shrimp (diagnostic) and Hctz [hydrochlorothiazide ]   Review of Systems Review of Systems  HENT:         Tongue swelling  All other systems reviewed and are negative.    Physical Exam Triage Vital Signs ED Triage Vitals  Encounter Vitals Group     BP 08/21/23 0815 (!) 179/80     Girls Systolic BP Percentile --      Girls Diastolic BP Percentile --      Boys Systolic BP Percentile --      Boys Diastolic BP Percentile --      Pulse Rate 08/21/23 0815 78     Resp 08/21/23 0815 20     Temp 08/21/23 0815 98.8 F (37.1 C)     Temp Source 08/21/23 0815 Oral     SpO2 08/21/23 0815 100 %     Weight 08/21/23 0815 181 lb 3.5 oz  (82.2 kg)     Height --      Head Circumference --      Peak Flow --      Pain Score 08/21/23 0814 0     Pain Loc --      Pain Education --      Exclude from Growth Chart --    No data found.  Updated Vital Signs BP (!) 179/80 (BP Location: Left Arm)   Pulse 78   Temp 98.8 F (37.1 C) (Oral)   Resp 20   Wt 181 lb 3.5 oz (82.2 kg)   LMP 07/16/2014   SpO2 100%   BMI 35.39 kg/m   Visual Acuity Right Eye Distance:   Left Eye Distance:   Bilateral Distance:    Right Eye Near:   Left Eye Near:    Bilateral Near:     Physical Exam Vitals and nursing note reviewed.  Constitutional:      General: She is not in acute distress.    Appearance: She is not toxic-appearing or diaphoretic.  HENT:     Mouth/Throat:     Tongue: No lesions.      Comments: Significant tongue swelling present.   Eyes:     Extraocular Movements: Extraocular movements intact.     Conjunctiva/sclera: Conjunctivae normal.     Pupils: Pupils are equal, round, and reactive to light.  Pulmonary:     Effort: No respiratory distress.     Breath sounds: No wheezing.     Comments: Increased work of breathing Abdominal:  General: Abdomen is flat. Bowel sounds are normal.  Skin:    General: Skin is warm and dry.     Findings: No erythema or rash.  Neurological:     Mental Status: She is alert.      UC Treatments / Results  Labs (all labs ordered are listed, but only abnormal results are displayed) Labs Reviewed - No data to display  EKG   Radiology No results found.  Procedures Procedures (including critical care time)  Medications Ordered in UC Medications  methylPREDNISolone  sodium succinate (SOLU-MEDROL ) 125 mg/2 mL injection 125 mg (has no administration in time range)  diphenhydrAMINE  (BENADRYL ) injection 25 mg (has no administration in time range)    Initial Impression / Assessment and Plan / UC Course  I have reviewed the triage vital signs and the nursing  notes.  Pertinent labs & imaging results that were available during my care of the patient were reviewed by me and considered in my medical decision making (see chart for details).     This patient presents to the UC for concern of allergic reaction.  Differential diagnosis includes anaphylaxis, angioedema, medication reaction   Medicines ordered and prescription drug management:  I ordered medication including Solu-Medrol , Benadryl  for allergic reaction Reevaluation of the patient after these medicines showed that the patient stayed the same I have reviewed the patients home medicines and have made adjustments as needed   Problem List / UC course:  Patient presents to the urgent care today with concerns of allergic reaction.  Reports that she woke up around 7 AM this morning with notable tongue swelling.  History of anaphylaxis in relation to prescription exposure but denies any seafood intake recently.  Does report that she took ibuprofen  right before bed last night and woke up this morning with the symptoms.  Unclear this potential trigger.  Also reports eating outside of home cooking yesterday was from [employer]. On exam, patient is alert and oriented and interacting appropriately.  There is significant swelling of the tongue present. No rash, wheezing, but patient with slightly increased work of breathing unclear if due to possible allergic reaction vs baseline. Based on concern for airway compromise, transporting via EMS to hospital for further management and monitoring. Patient and son agreeable with this plan.   Social Determinants of Health:  None  Final Clinical Impressions(s) / UC Diagnoses   Final diagnoses:  Allergic reaction, initial encounter     Discharge Instructions      You were briefly seen for concerns of tongue swelling and an allergic reaction. You are being transported to a local emergency department for further evaluation and monitoring.    ED  Prescriptions   None    PDMP not reviewed this encounter.   Jetson Pickrel A, PA-C 08/21/23 [phone]

## 2023-08-21 NOTE — Discharge Instructions (Addendum)
 You were briefly seen for concerns of tongue swelling and an allergic reaction. You are being transported to a local emergency department for further evaluation and monitoring.

## 2023-08-21 NOTE — ED Triage Notes (Signed)
 Pt BIB [facility] from [facility] d/t possible angioedema. Per EMS, it happened around 7 am this morning. Pt received benadryl  and solumedrol at UC. Airway clear. A&O X4.

## 2023-08-21 NOTE — H&P (Signed)
 History and Physical    Patient: Lydia Walsh FMW:[id number] DOB: 1955-04-13 DOA: 08/21/2023 DOS: the patient was seen and examined on 08/21/2023 . PCP: Nooruddin, Saad, MD  Patient coming from: Home Chief complaint: Chief Complaint  Patient presents with   Oral Swelling   HPI:  Lydia Walsh is a 68 y.o. 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  No lip swelling no difficulty breathing shortness of breath chest pain palpitations no reports of difficulty swallowing.  Patient did go to urgent care where she received Solu-Medrol  and Benadryl .  Of note patient has allergy  to shrimp however has had a CT angio study in the past.  Some not sure if the patient has an allergy  to iodine or iodinated contrast.   ED Course:  Vital signs in the ED were notable for the following:  Vitals:   08/21/23 1115 08/21/23 1359 08/21/23 1538 08/21/23 1945  BP: (!) 157/59  (!) 159/76 (!) 144/91  Pulse: 74  87 92  Temp:  98.5 F (36.9 C) 98.3 F (36.8 C) 99 F (37.2 C)  Resp: 20  17 20   Height:      Weight:      SpO2: 97%  94% 94%  TempSrc:  Oral Oral Oral  BMI (Calculated):      >>ED evaluation thus far shows: Basic metabolic panel shows glucose of 119 normal creatinine LFTs added on and pending. CBC within normal limits.   >>While in the ED patient received the following: Medications  tranexamic acid  (CYKLOKAPRON ) IVPB 1,000 mg (0 mg Intravenous Stopped 08/21/23 0931)  famotidine  (PEPCID ) IVPB 20 mg premix (0 mg Intravenous Stopped 08/21/23 1019)   Review of Systems  All other systems reviewed and are negative.  Past Medical History:  Diagnosis Date    Anemia    Back pain    Cystitis 03/11/2023   Cystitis, acute 03/11/2023   Endometrial cancer [facility])    Endometrial polyp    GERD (gastroesophageal reflux disease)    Gout    Heart murmur    Heart palpitations    History of blood transfusion    History of chemotherapy    History of radiation therapy 09/02/2015-10/24/2015   Endometrium 10/15/15- 10/24/15 HDR Treatment, 09/02/15-10/08/15 50 Gy pelvic radiation; Dr. Lynwood Nasuti   History of uterine fibroid    Hyperlipidemia    Hypertension    Hypothyroidism    IBS (irritable bowel syndrome)    Joint pain    OSA (obstructive sleep apnea)    moderate OSA per study 06-21-2005--  per pt does need cpap anymore never purchased it   Osteoarthritis    Osteoporosis 02/23/2022   Patient has confirmed diagnosis of osteoporosis with DEXA scan done a few months ago.  Alendronate  was sent in, however patient did not receive it.  Could be secondary to older age, as well as cancer treatment years ago.  Also, vitamin D  levels at low normal at 34.  Given osteoporosis confirmed, will also supplement with vitamin D .  Will patient is agreeable to starting alendronate , and have expla   Other fatigue    Other specified disorders of thyroid     PMB (postmenopausal bleeding)    Shortness of breath on exertion  Wears dentures    upper   Wears glasses    Past Surgical History:  Procedure Laterality Date   ABDOMINAL HYSTERECTOMY     BREAST EXCISIONAL BIOPSY Right    benign   CHOLECYSTECTOMY     CHOLECYSTECTOMY, LAPAROSCOPIC  2020   COLONOSCOPY     DILATATION & CURETTAGE/HYSTEROSCOPY WITH MYOSURE N/A 03/07/2015   Procedure: DILATATION & CURETTAGE/HYSTEROSCOPY WITH MYOSURE (POLYP);  Surgeon: Norleen Skill, MD;  Location: [facility];  Service: Gynecology;  Laterality: N/A;   DILATATION & CURRETTAGE/HYSTEROSCOPY WITH RESECTOCOPE  12-13-2002  &  09-26-2004   polyp/  submucosal fibroid   ESOPHAGOGASTRODUODENOSCOPY     EXCISION BENIGN RIGHT BREAST  MASS  age 66   IR REMOVAL TUN ACCESS W/ PORT W/O FL MOD SED  07/17/2016   KNEE ARTHROSCOPY W/ MENISCECTOMY Bilateral right 04-12-2006//  left 12-29-2006   and Chondroplasty   port placement     ROBOTIC ASSISTED TOTAL HYSTERECTOMY WITH BILATERAL SALPINGO OOPHERECTOMY Bilateral 05/02/2015   Procedure: XI ROBOTIC ASSISTED TOTAL HYSTERECTOMY WITH BILATERAL SALPINGO OOPHORECTOMY AND SENTINAL LYMPH NODE BIOPSY;  Surgeon: Sari Bachelor, MD;  Location: [facility];  Service: Gynecology;  Laterality: Bilateral;   TRANSTHORACIC ECHOCARDIOGRAM  04-02-2010   normal LV, ef 65%   TUBAL LIGATION  1980's    reports that she quit smoking about 29 years ago. Her smoking use included cigarettes. She started smoking about 31 years ago. She has been exposed to tobacco smoke. She has never used smokeless tobacco. She reports that she does not drink alcohol and does not use drugs. Allergies  Allergen Reactions   Shrimp (Diagnostic)     Anaphylaxis   Hctz [Hydrochlorothiazide ] Rash   Family History  Problem Relation Age of Onset   Hypertension Mother    Heart failure Mother    Sudden death Mother    Stroke Mother    Cirrhosis Father        alcoholic   Liver disease Father    Alcoholism Father    Heart disease Son    Hypertension Son    Colon cancer Neg Hx    Stomach cancer Neg Hx    Rectal cancer Neg Hx    Esophageal cancer Neg Hx    Liver cancer Neg Hx    Prior to Admission medications   Medication Sig Start Date End Date Taking? Authorizing Provider  allopurinol  (ZYLOPRIM ) 100 MG tablet Take 1 tablet (100 mg total) by mouth daily. 07/05/23   Nooruddin, Saad, MD  amLODipine  (NORVASC ) 10 MG tablet Take 1 tablet by mouth once daily 07/27/23   Nooruddin, Saad, MD  ezetimibe  (ZETIA ) 10 MG tablet Take 1 tablet by mouth once daily 06/24/22   Court Dorn PARAS, MD  fluticasone  (FLONASE ) 50 MCG/ACT nasal spray PLACE 1 TO 2 SPRAYS IN EACH NOSTIL ONCE A DAY AS NEEDED FOR TREATMENT OF ALLERGIES 03/22/23   Nooruddin,  Saad, MD  levothyroxine  (SYNTHROID ) 125 MCG tablet Take 1 tablet by mouth once daily 08/13/23   Nooruddin, Saad, MD  metoprolol  succinate (TOPROL -XL) 50 MG 24 hr tablet TAKE 3 TABLETS BY MOUTH ONCE DAILY WITH MEALS 03/22/23   Nooruddin, Saad, MD  Multiple Vitamins-Minerals (CENTRUM PO) Take by mouth.    [provider]  omeprazole  (PRILOSEC) 40 MG capsule Take 1 capsule (40 mg total) by mouth daily. 05/31/23   Nooruddin, Saad, MD  rosuvastatin  (CRESTOR ) 40 MG tablet Take 1 tablet by mouth once daily 05/18/23   Court Dorn PARAS, MD  spironolactone  (ALDACTONE ) 25 MG  tablet Take 3 tablets (75 mg total) by mouth daily. 03/11/23   Amoako, Prince, MD  valsartan  (DIOVAN ) 80 MG tablet Take 1 tablet (80 mg total) by mouth daily. 07/26/23 07/25/24  Nooruddin, Saad, MD  esomeprazole  (NEXIUM ) 20 MG capsule Take 2 capsules (40 mg total) by mouth daily before breakfast. 10/15/10 04/08/11  Illath, Jaseela, MD                                                                                 Vitals:   08/21/23 1115 08/21/23 1359 08/21/23 1538 08/21/23 1945  BP: (!) 157/59  (!) 159/76 (!) 144/91  Pulse: 74  87 92  Resp: 20  17 20   Temp:  98.5 F (36.9 C) 98.3 F (36.8 C) 99 F (37.2 C)  TempSrc:  Oral Oral Oral  SpO2: 97%  94% 94%  Weight:      Height:       Physical Exam Vitals reviewed.  Constitutional:      General: She is not in acute distress.    Appearance: She is not ill-appearing.  HENT:     Head: Normocephalic.     Mouth/Throat:     Comments: Tongue swelling.  Eyes:     Extraocular Movements: Extraocular movements intact.  Cardiovascular:     Rate and Rhythm: Normal rate and regular rhythm.     Heart sounds: Normal heart sounds.  Pulmonary:     Breath sounds: Normal breath sounds.  Abdominal:     General: There is no distension.     Palpations: Abdomen is soft.     Tenderness: There is no abdominal tenderness.  Neurological:     General: No focal deficit present.     Mental Status:  She is alert and oriented to person, place, and time.     Labs on Admission: I have personally reviewed following labs and imaging studies CBC: Recent Labs  Lab 08/21/23 0933  WBC 5.9  NEUTROABS 4.9  HGB 12.6  HCT 40.6  MCV 93.1  PLT 172   Basic Metabolic Panel: Recent Labs  Lab 08/21/23 0933  NA 140  K 4.5  CL 106  CO2 23  GLUCOSE 119*  BUN 8  CREATININE 0.81  CALCIUM  9.9   GFR: Estimated Creatinine Clearance: 59.5 mL/min (by C-G formula based on SCr of 0.81 mg/dL). Liver Function Tests: Recent Labs  Lab 08/21/23 1506  AST 68*  ALT 73*  ALKPHOS 92  BILITOT 0.6  PROT 7.1  ALBUMIN 3.6   No results for input(s): LIPASE, AMYLASE in the last 168 hours. No results for input(s): AMMONIA in the last 168 hours. Recent Labs    01/28/23 0902 03/11/23 0906 03/18/23 1025 06/24/23 1333 08/21/23 0933  BUN 13 17 10 21 8   CREATININE 0.55* 0.69 0.69 0.79 0.81    Estimated Creatinine Clearance: 59.5 mL/min (by C-G formula based on SCr of 0.81 mg/dL).   Recent Labs    01/28/23 0902 03/11/23 0906 03/18/23 1025 06/24/23 1333 08/21/23 0933  BUN 13 17 10 21 8   CREATININE 0.55* 0.69 0.69 0.79 0.81  CO2 23 20 20* 17* 23   Cardiac Enzymes: No results for input(s): CKTOTAL, CKMB, CKMBINDEX, TROPONINI  in the last 168 hours. BNP (last 3 results) No results for input(s): PROBNP in the last 8760 hours. HbA1C: No results for input(s): HGBA1C in the last 72 hours. CBG: No results for input(s): GLUCAP in the last 168 hours. Lipid Profile: No results for input(s): CHOL, HDL, LDLCALC, TRIG, CHOLHDL, LDLDIRECT in the last 72 hours. Thyroid  Function Tests: No results for input(s): TSH, T4TOTAL, FREET4, T3FREE, THYROIDAB in the last 72 hours. Anemia Panel: No results for input(s): VITAMINB12, FOLATE, FERRITIN, TIBC, IRON, RETICCTPCT in the last 72 hours. Urine analysis:    Component Value Date/Time   COLORURINE YELLOW  06/24/2023 1500   APPEARANCEUR HAZY (A) 06/24/2023 1500   APPEARANCEUR Clear 03/17/2018 0934   LABSPEC 1.033 (H) 06/24/2023 1500   LABSPEC 1.005 12/12/2015 1244   PHURINE 5.0 06/24/2023 1500   GLUCOSEU NEGATIVE 06/24/2023 1500   GLUCOSEU Negative 12/12/2015 1244   HGBUR NEGATIVE 06/24/2023 1500   BILIRUBINUR NEGATIVE 06/24/2023 1500   BILIRUBINUR Negative 03/11/2023 0839   BILIRUBINUR Negative 03/17/2018 0934   BILIRUBINUR Negative 12/12/2015 1244   KETONESUR 5 (A) 06/24/2023 1500   PROTEINUR NEGATIVE 06/24/2023 1500   UROBILINOGEN 0.2 03/11/2023 0839   UROBILINOGEN 0.2 12/12/2015 1244   NITRITE NEGATIVE 06/24/2023 1500   LEUKOCYTESUR TRACE (A) 06/24/2023 1500   LEUKOCYTESUR Moderate 12/12/2015 1244   Radiological Exams on Admission: No results found. Data Reviewed: Relevant notes from primary care and specialist visits, past discharge summaries as available in EHR, including Care Everywhere . Prior diagnostic testing as pertinent to current admission diagnoses, Updated medications and problem lists for reconciliation .ED course, including vitals, labs, imaging, treatment and response to treatment,Triage notes, nursing and pharmacy notes and ED provider's notes.Notable results as noted in HPI.Discussed case with EDMD/ ED APP/ or Specialty MD on call and as needed.  Assessment & Plan  >> Angioedema: Suspect histamine based reaction secondary to patient's medications causing IgE mediated allergic reaction.  Patient given H1 and H2 blockers corticosteroids.  Patient also received TXA for possible bradykinin mediated etiology.  Will continue patient on H1 and H2 blockers along with Solu-Medrol  twice daily.  As needed albuterol as needed.  Holding her allopurinol  and Diovan  and monitoring her blood pressure overnight on current existing meds.  >> Essential hypertension: Vitals:   08/21/23 0915 08/21/23 0930 08/21/23 1034 08/21/23 1045  BP: (!) 165/77 (!) 161/97 (!) 146/64 (!) 150/59    08/21/23 1100 08/21/23 1115 08/21/23 1538 08/21/23 1945  BP: (!) 158/64 (!) 157/59 (!) 159/76 (!) 144/91  Will continue patient currently on her Aldactone , her metoprolol , heart Norvasc . As needed hydralazine and switch hydralazine to scheduled Q8 if additional medication is needed.    >> Hypothyroidism: Can continue patient on her levothyroxine .    DVT prophylaxis:  Heparin  Consults:  None  Advance Care Planning:    Code Status: Full Code   Family Communication:  Both sons Disposition Plan:  Home Severity of Illness: The appropriate patient status for this patient is OBSERVATION. Observation status is judged to be reasonable and necessary in order to provide the required intensity of service to ensure the patient's safety. The patient's presenting symptoms, physical exam findings, and initial radiographic and laboratory data in the context of their medical condition is felt to place them at decreased risk for further clinical deterioration. Furthermore, it is anticipated that the patient will be medically stable for discharge from the hospital within 2 midnights of admission.   Unresulted Labs (From admission, onward)     Start  Ordered   08/22/23 0500  Comprehensive metabolic panel  Tomorrow morning,   R        08/21/23 1400   08/22/23 0500  CBC  Tomorrow morning,   R        08/21/23 1400            Meds ordered this encounter  Medications   tranexamic acid  (CYKLOKAPRON ) IVPB 1,000 mg   famotidine  (PEPCID ) IVPB 20 mg premix   alum & mag hydroxide-simeth (MAALOX/MYLANTA) 200-200-20 MG/5ML suspension 30 mL   famotidine  (PEPCID ) IVPB 20 mg premix   diphenhydrAMINE  (BENADRYL ) injection 12.5 mg   methylPREDNISolone  sodium succinate (SOLU-MEDROL ) 40 mg/mL injection 40 mg    IV methylprednisolone  will be converted to either a q12h or q24h frequency with the same total daily dose (TDD).  Ordered Dose: 1 to 125 mg TDD; convert to: TDD q24h.  Ordered Dose: 126 to 250 mg  TDD; convert to: TDD div q12h.  Ordered Dose: >250 mg TDD; DAW.   DISCONTD: allopurinol  (ZYLOPRIM ) tablet 100 mg   amLODipine  (NORVASC ) tablet 10 mg   ezetimibe  (ZETIA ) tablet 10 mg   metoprolol  succinate (TOPROL -XL) 24 hr tablet 150 mg    TAKE 3 TABLETS BY MOUTH ONCE DAILY WITH MEALS     levothyroxine  (SYNTHROID ) tablet 125 mcg   sodium chloride  flush (NS) 0.9 % injection 3 mL   0.9 %  sodium chloride  infusion   OR Linked Order Group    acetaminophen  (TYLENOL ) tablet 650 mg    acetaminophen  (TYLENOL ) suppository 650 mg   heparin  injection 5,000 Units   rosuvastatin  (CRESTOR ) tablet 40 mg   spironolactone  (ALDACTONE ) tablet 75 mg     Orders Placed This Encounter  Procedures   Basic metabolic panel   CBC with Differential   Hepatic function panel   Sedimentation rate   HIV Antibody (routine testing w rflx)   Comprehensive metabolic panel   CBC   DIET SOFT Room service appropriate? Yes; Fluid consistency: Thin   Maintain IV access   Vital signs   Notify physician (specify)   Mobility Protocol: No Restrictions   Refer to Sidebar Report Mobility Protocol for Adult Inpatient   Initiate Adult Central Line Maintenance and Catheter Protocol for patients with central line (CVC, PICC, Port, Hemodialysis, Trialysis)   Daily weights   Intake and Output   Initiate CHG Protocol   Do not place and if present remove PureWick   Initiate Oral Care Protocol   Initiate Carrier Fluid Protocol   RN may order General Admission PRN Orders utilizing General Admission PRN medications (through manage orders) for the following patient needs: allergy  symptoms (Claritin ), cold sores (Carmex), cough (Robitussin DM), eye irritation (Liquifilm Tears), hemorrhoids (Tucks), indigestion (Maalox), minor skin irritation (Hydrocortisone Cream), muscle pain Lucienne Gay), nose irritation (saline nasal spray) and sore throat (Chloraseptic spray).   Full code   Consult to internal medicine   ED Pulse oximetry,  continuous   Pulse oximetry check with vital signs   Oxygen therapy Mode or (Route): Nasal cannula; Liters Per Minute: 2; Keep O2 saturation between: greater than 92 %   ED EKG   Place in observation (patient's expected length of stay will be less than 2 midnights)    Author: Mario LULLA Blanch, MD 12 pm- 8 pm. [facility]. 08/21/2023 7:48 PM Please note for any communication after hours contact [facility] Assigned provider on call on Amion.

## 2023-08-21 NOTE — ED Triage Notes (Signed)
 Pt presents c/o tongue swelling x 1 days. Pt reports the swelling started overnight a. Pt denies contact with any known allergens. Pt's airway is partially obstructed. O2 Sat is at 98.

## 2023-08-21 NOTE — Plan of Care (Signed)

## 2023-08-21 NOTE — ED Provider Notes (Signed)
 [facility] AT [facility] Provider Note   CSN: [id number] Arrival date & time: 08/21/23  9094     Patient presents with: Oral Swelling   Lydia Walsh is a 68 y.o. female.   68 year old female sent from urgent care for evaluation of angioedema.  She does have swelling of her tongue.  States she woke up at 7 AM with a swollen tongue.  States she is not having any trouble breathing but does have a sore throat as well.  Patient is not on an ACE inhibitor.  States this is never happened before.  She denies any change in medication or consumption of any known allergens.  Does have a allergy  to shrimp but did not eat this.  States she took some ibuprofen  last night before bed.  Denies any other symptoms or concerns.        Prior to Admission medications   Medication Sig Start Date End Date Taking? Authorizing Provider  allopurinol  (ZYLOPRIM ) 100 MG tablet Take 1 tablet (100 mg total) by mouth daily. 07/05/23   Nooruddin, Saad, MD  amLODipine  (NORVASC ) 10 MG tablet Take 1 tablet by mouth once daily 07/27/23   Nooruddin, Saad, MD  ezetimibe  (ZETIA ) 10 MG tablet Take 1 tablet by mouth once daily 06/24/22   Court Dorn PARAS, MD  fluticasone  (FLONASE ) 50 MCG/ACT nasal spray PLACE 1 TO 2 SPRAYS IN EACH NOSTIL ONCE A DAY AS NEEDED FOR TREATMENT OF ALLERGIES 03/22/23   Nooruddin, Roetta, MD  levothyroxine  (SYNTHROID ) 125 MCG tablet Take 1 tablet by mouth once daily 08/13/23   Nooruddin, Saad, MD  metoprolol  succinate (TOPROL -XL) 50 MG 24 hr tablet TAKE 3 TABLETS BY MOUTH ONCE DAILY WITH MEALS 03/22/23   Nooruddin, Saad, MD  Multiple Vitamins-Minerals (CENTRUM PO) Take by mouth.    [provider]  omeprazole  (PRILOSEC) 40 MG capsule Take 1 capsule (40 mg total) by mouth daily. 05/31/23   Nooruddin, Saad, MD  rosuvastatin  (CRESTOR ) 40 MG tablet Take 1 tablet by mouth once daily 05/18/23   Court Dorn PARAS, MD  spironolactone  (ALDACTONE ) 25 MG tablet Take 3 tablets (75  mg total) by mouth daily. 03/11/23   Amoako, Prince, MD  valsartan  (DIOVAN ) 80 MG tablet Take 1 tablet (80 mg total) by mouth daily. 07/26/23 07/25/24  Nooruddin, Saad, MD  esomeprazole  (NEXIUM ) 20 MG capsule Take 2 capsules (40 mg total) by mouth daily before breakfast. 10/15/10 04/08/11  Illath, Jaseela, MD    Allergies: Shrimp (diagnostic) and Hctz [hydrochlorothiazide ]    Review of Systems  Constitutional:  Negative for chills and fever.  HENT:  Negative for ear pain and sore throat.        Admits tongue swelling  Eyes:  Negative for pain and visual disturbance.  Respiratory:  Negative for cough and shortness of breath.   Cardiovascular:  Negative for chest pain and palpitations.  Gastrointestinal:  Negative for abdominal pain and vomiting.  Genitourinary:  Negative for dysuria and hematuria.  Musculoskeletal:  Negative for arthralgias and back pain.  Skin:  Negative for color change and rash.  Neurological:  Negative for seizures and syncope.  All other systems reviewed and are negative.   Updated Vital Signs BP (!) 157/59   Pulse 74   Temp (!) 97.3 F (36.3 C) (Oral)   Resp 20   Ht 5' (1.524 m)   Wt 77.6 kg   LMP 07/16/2014   SpO2 97%   BMI 33.40 kg/m   Physical Exam Vitals and  nursing note reviewed.  Constitutional:      General: She is not in acute distress.    Appearance: Normal appearance. She is well-developed. She is not ill-appearing.  HENT:     Head: Normocephalic and atraumatic.     Mouth/Throat:     Comments: Tongue swelling noted, no airway swelling or difficulty breathing Eyes:     Conjunctiva/sclera: Conjunctivae normal.  Cardiovascular:     Rate and Rhythm: Normal rate and regular rhythm.     Pulses: Normal pulses.     Heart sounds: Normal heart sounds. No murmur heard. Pulmonary:     Effort: Pulmonary effort is normal. No respiratory distress.     Breath sounds: Normal breath sounds. No stridor. No wheezing, rhonchi or rales.  Chest:     Chest  wall: No tenderness.  Abdominal:     Palpations: Abdomen is soft.     Tenderness: There is no abdominal tenderness.  Musculoskeletal:        General: No swelling.     Cervical back: Neck supple.  Skin:    General: Skin is warm and dry.     Capillary Refill: Capillary refill takes less than 2 seconds.  Neurological:     Mental Status: She is alert.  Psychiatric:        Mood and Affect: Mood normal.     (all labs ordered are listed, but only abnormal results are displayed) Labs Reviewed  BASIC METABOLIC PANEL WITH GFR - Abnormal; Notable for the following components:      Result Value   Glucose, Bld 119 (*)    All other components within normal limits  CBC WITH DIFFERENTIAL/PLATELET    EKG: None  Radiology: No results found.   Procedures   Medications Ordered in the ED  alum & mag hydroxide-simeth (MAALOX/MYLANTA) 200-200-20 MG/5ML suspension 30 mL (has no administration in time range)  tranexamic acid  (CYKLOKAPRON ) IVPB 1,000 mg (0 mg Intravenous Stopped 08/21/23 0931)  famotidine  (PEPCID ) IVPB 20 mg premix (0 mg Intravenous Stopped 08/21/23 1019)    Clinical Course as of 08/21/23 1350  Sat Aug 21, 2023  1020 EKG not crossing over into MUSE, EKG interpreted by me in the absence of cardiology and shows sinus rhythm heart 72, 70 prolonged PR interval, no STEMI [MK]    Clinical Course User Index [MK] Gennaro Duwaine CROME, DO                                 Medical Decision Making Cardiac monitor interpretation: Sinus rhythm, no ectopy  Patient sent from urgent care for angioedema.  On arrival she has swelling of her tongue, but stable vital signs but does have some difficulty speaking as well.  She was given Benadryl  and Solu-Medrol  as well as fluids from urgent care.  I will give her Pepcid  and a dose of 1 g of IV TXA.  Patient was closely monitored for multiple hours.  On each recheck she seemed to be somewhat improving.  And did show some improvement in the swelling in  her tongue and ability to speak.  I discussed patient's case with hospitalist and patient will be admitted for further workup and management.  Patient and family at bedside are agreeable with the plan.  Problems Addressed: Angioedema, initial encounter: acute illness or injury that poses a threat to life or bodily functions  Amount and/or Complexity of Data Reviewed Independent Historian: EMS    Details:  EMS helps provide history-states that she was sent from urgent care.  States that she was given IV fluids as well as Benadryl  and Solu-Medrol  at urgent care External Data Reviewed: notes.    Details: Urgent care records reviewed and patient was sent here for angioedema Labs: ordered. Decision-making details documented in ED Course.    Details: Ordered and reviewed by me and unremarkable, labs nonactionable ECG/medicine tests: ordered and independent interpretation performed. Decision-making details documented in ED Course.    Details: Ordered and interpreted by me in the absence of cardiology, but EKG was not crossing over into MUSE.  EKG shows sinus rhythm, no STEMI and no acute change when compared to prior EKGs Discussion of management or test interpretation with external provider(s): Dr. York spoke with her regarding the patient's case and she accepted the patient for further workup and management/observation for her angioedema  Risk OTC drugs. Prescription drug management. Drug therapy requiring intensive monitoring for toxicity. Decision regarding hospitalization. Risk Details: CRITICAL CARE Performed by: Duwaine LITTIE Fusi   Total critical care time: 34 minutes  Critical care time was exclusive of separately billable procedures and treating other patients.  Critical care was necessary to treat or prevent imminent or life-threatening deterioration.  Critical care was time spent personally by me on the following activities: development of treatment plan with patient  and/or surrogate as well as nursing, discussions with consultants, evaluation of patient's response to treatment, examination of patient, obtaining history from patient or surrogate, ordering and performing treatments and interventions, ordering and review of laboratory studies, ordering and review of radiographic studies, pulse oximetry and re-evaluation of patient's condition.   Critical Care Total time providing critical care: 34 minutes     Final diagnoses:  Angioedema, initial encounter    ED Discharge Orders     None          Fusi Duwaine LITTIE, DO 08/21/23 1350

## 2023-08-22 DIAGNOSIS — T783XXA Angioneurotic edema, initial encounter: Secondary | ICD-10-CM | POA: Diagnosis not present

## 2023-08-22 LAB — COMPREHENSIVE METABOLIC PANEL WITH GFR
ALT: 61 U/L — ABNORMAL HIGH (ref 0–44)
AST: 46 U/L — ABNORMAL HIGH (ref 15–41)
Albumin: 3.6 g/dL (ref 3.5–5.0)
Alkaline Phosphatase: 86 U/L (ref 38–126)
Anion gap: 13 (ref 5–15)
BUN: 15 mg/dL (ref 8–23)
CO2: 23 mmol/L (ref 22–32)
Calcium: 10.3 mg/dL (ref 8.9–10.3)
Chloride: 101 mmol/L (ref 98–111)
Creatinine, Ser: 0.84 mg/dL (ref 0.44–1.00)
GFR, Estimated: >60 mL/min (ref >60)
Glucose, Bld: 215 mg/dL — ABNORMAL HIGH (ref 70–99)
Potassium: 3.4 mmol/L — ABNORMAL LOW (ref 3.5–5.1)
Sodium: 137 mmol/L (ref 135–145)
Total Bilirubin: 0.7 mg/dL (ref 0.0–1.2)
Total Protein: 7 g/dL (ref 6.5–8.1)

## 2023-08-22 LAB — CBC
HCT: 40.8 % (ref 36.0–46.0)
Hemoglobin: 13.5 g/dL (ref 12.0–15.0)
MCH: 29.2 pg (ref 26.0–34.0)
MCHC: 33.1 g/dL (ref 30.0–36.0)
MCV: 88.3 fL (ref 80.0–100.0)
Platelets: 195 K/uL (ref 150–400)
RBC: 4.62 MIL/uL (ref 3.87–5.11)
RDW: 13.3 % (ref 11.5–15.5)
WBC: 10.6 K/uL — ABNORMAL HIGH (ref 4.0–10.5)
nRBC: 0 % (ref 0.0–0.2)

## 2023-08-22 MED ORDER — POTASSIUM CHLORIDE CRYS ER 20 MEQ PO TBCR
20.0000 meq | EXTENDED_RELEASE_TABLET | Freq: Once | ORAL | Status: AC
  Administered 2023-08-22: 20 meq via ORAL
  Filled 2023-08-22: qty 1

## 2023-08-22 MED ORDER — DIPHENHYDRAMINE HCL 25 MG PO CAPS: 25.0000 mg | ORAL_CAPSULE | Freq: Four times a day (QID) | ORAL | Status: DC | PRN

## 2023-08-22 MED ORDER — PREDNISONE 10 MG PO TABS: ORAL_TABLET | ORAL | 0 refills | Status: DC

## 2023-08-22 MED ORDER — PREDNISONE 20 MG PO TABS
40.0000 mg | ORAL_TABLET | Freq: Every day | ORAL | Status: DC
  Administered 2023-08-22: 40 mg via ORAL
  Filled 2023-08-22: qty 2

## 2023-08-22 MED ORDER — FAMOTIDINE 20 MG PO TABS: 20.0000 mg | ORAL_TABLET | Freq: Two times a day (BID) | ORAL | 0 refills | Status: DC

## 2023-08-22 MED ORDER — EPINEPHRINE 0.3 MG/0.3ML IJ SOAJ: 0.3000 mg | INTRAMUSCULAR | 1 refills | Status: AC | PRN

## 2023-08-22 NOTE — Plan of Care (Signed)
  Problem: Clinical Measurements: Goal: Ability to maintain clinical measurements within normal limits will improve Outcome: Progressing   Problem: Nutrition: Goal: Adequate nutrition will be maintained Outcome: Progressing   Problem: Safety: Goal: Ability to remain free from injury will improve Outcome: Progressing   Problem: Education: Goal: Knowledge of General Education information will improve Description: Including pain rating scale, medication(s)/side effects and non-pharmacologic comfort measures Outcome: Not Progressing

## 2023-08-22 NOTE — Plan of Care (Signed)

## 2023-08-22 NOTE — Discharge Summary (Signed)
 PATIENT BELONGS TO IMTS      Physician Discharge Summary  Lydia Walsh FMW:[id number] DOB: July 10, 1955 DOA: 08/21/2023  PCP: Nooruddin, Saad, MD  Admit date: 08/21/2023 Discharge date: 08/22/2023  Admitted From: home Discharge disposition: home   Recommendations for Outpatient Follow-Up:   STOP the ARB (added to allergy  list) Adjust other BP meds as needed    Discharge Diagnosis:   Principal Problem:   Angioedema of tongue    Discharge Condition: Improved.  Diet recommendation: Low sodium, heart healthy.   Wound care: None.  Code status: Full.   History of Present Illness:   Lydia Walsh is a 68 y.o. 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  No lip swelling no difficulty breathing shortness of breath chest pain palpitations no reports of difficulty swallowing.  Patient did go to urgent care where she received Solu-Medrol  and Benadryl .  Of note patient has allergy  to shrimp however has had a CT angio study in the past.  Some not sure if the patient has an allergy  to iodine or iodinated contrast.    Hospital Course by Problem:   >> Angioedema: Suspect histamine based reaction secondary to patient's medications causing IgE mediated allergic reaction.  Patient given H1 and H2 blockers corticosteroids.  Patient also received TXA for possible bradykinin mediated etiology.   Much improved on day of d/c - STOP ARB   Medical Consultants:      Discharge Exam:   Vitals:   08/22/23 0352 08/22/23 0753  BP: (!) 155/99 (!) 153/72  Pulse: 86 77  Resp: 20 18  Temp: 98.2 F (36.8 C) 98.2 F (36.8 C)  SpO2: 100% 94%    Vitals:   08/21/23 1945 08/21/23 2324 08/22/23 0352 08/22/23 0753  BP: (!) 144/91 (!) 145/71 (!) 155/99 (!) 153/72  Pulse: 92 92 86 77  Resp: 20 20 20 18   Temp: 99 F (37.2 C) 98 F (36.7 C) 98.2 F (36.8 C) 98.2 F (36.8 C)  TempSrc: Oral Oral Oral Oral  SpO2: 94% 99% 100% 94%  Weight:   82.5 kg   Height:        General exam: Appears calm and comfortable  The results of significant diagnostics from this hospitalization (including imaging, microbiology, ancillary and laboratory) are listed below for reference.     Procedures and Diagnostic Studies:   No results found.   Labs:   Basic Metabolic Panel: Recent Labs  Lab 08/21/23 0933 08/22/23 0828  NA 140 137  K 4.5 3.4*  CL 106 101  CO2 23 23  GLUCOSE 119* 215*  BUN 8 15  CREATININE 0.81 0.84  CALCIUM  9.9 10.3   GFR Estimated Creatinine Clearance: 59.3 mL/min (by C-G formula based on SCr of 0.84 mg/dL). Liver Function Tests: Recent Labs  Lab 08/21/23 1506 08/22/23 0828  AST 68* 46*  ALT 73* 61*  ALKPHOS 92 86  BILITOT 0.6 0.7  PROT 7.1 7.0  ALBUMIN 3.6 3.6   No results for input(s): LIPASE, AMYLASE in the last 168 hours. No results for input(s): AMMONIA in the last 168 hours. Coagulation profile No results for input(s): INR, PROTIME in the last 168 hours.  CBC: Recent Labs  Lab 08/21/23 0933 08/22/23 9171  WBC 5.9 10.6*  NEUTROABS 4.9  --   HGB 12.6 13.5  HCT 40.6 40.8  MCV 93.1 88.3  PLT 172 195   Cardiac Enzymes: No results for input(s): CKTOTAL, CKMB, CKMBINDEX, TROPONINI in the last 168 hours. BNP: Invalid input(s): POCBNP CBG: No results for input(s): GLUCAP in the last 168 hours. D-Dimer No results for input(s): DDIMER in the last 72 hours. Hgb A1c No results for input(s): HGBA1C in the last 72 hours. Lipid Profile No results for input(s): CHOL, HDL, LDLCALC, TRIG, CHOLHDL, LDLDIRECT in the last 72 hours. Thyroid  function studies No  results for input(s): TSH, T4TOTAL, T3FREE, THYROIDAB in the last 72 hours.  Invalid input(s): FREET3 Anemia work up No results for input(s): VITAMINB12, FOLATE, FERRITIN, TIBC, IRON, RETICCTPCT in the last 72 hours. Microbiology No results found for this or any previous visit (from the past 240 hours).   Discharge Instructions:   Discharge Instructions     Diet - low sodium heart healthy   Complete by: As directed    Increase activity slowly   Complete by: As directed       Allergies as of 08/22/2023       Reactions   Diovan  [valsartan ]    Shrimp (diagnostic)    Anaphylaxis   Hctz [hydrochlorothiazide ] Rash        Medication List     PAUSE taking these medications    allopurinol  100 MG tablet Wait to take this until your doctor or other care provider tells you to start again. Commonly known as: ZYLOPRIM  Take 1 tablet (100 mg total) by mouth daily.       TAKE these medications    amLODipine  10 MG tablet Commonly known as: NORVASC  Take 1 tablet by mouth once daily   CENTRUM PO Take by mouth.   diphenhydrAMINE  25 mg capsule Commonly known as: Benadryl  Allergy  Take 1 capsule (25 mg total) by mouth every 6 (six) hours as needed for allergies.   EPINEPHrine  0.3 mg/0.3 mL Soaj injection Commonly known as: EPI-PEN Inject 0.3 mg into the muscle as needed for anaphylaxis.   ezetimibe  10 MG tablet Commonly known as: ZETIA  Take 1 tablet by mouth once daily   famotidine  20 MG tablet Commonly known as: PEPCID  Take 1 tablet (20 mg total) by mouth 2 (two) times daily.   fluticasone  50 MCG/ACT nasal spray Commonly known as: FLONASE  PLACE 1 TO 2 SPRAYS IN EACH NOSTIL ONCE A DAY AS NEEDED FOR TREATMENT OF ALLERGIES   levothyroxine  125 MCG tablet Commonly known as: SYNTHROID  Take 1 tablet by mouth once daily   metoprolol  succinate 50 MG 24 hr tablet Commonly known as: TOPROL -XL TAKE 3 TABLETS BY MOUTH ONCE DAILY WITH MEALS    omeprazole  40 MG capsule Commonly known as: PRILOSEC Take 1 capsule (40 mg total) by mouth daily.   predniSONE  10 MG tablet Commonly known as: DELTASONE  40mg  for 1 day then 30mg  x 1 day then 20mg  x  day then 10mg  x 1 day then stop   rosuvastatin  40 MG tablet Commonly known as: CRESTOR  Take 1 tablet by mouth once daily   spironolactone  25 MG tablet Commonly known as: ALDACTONE  Take 3 tablets (75 mg total) by mouth daily.        Follow-up Information     Nooruddin, Saad, MD Follow up in 1 week(s).   Specialty: Internal Medicine Contact information: [address] [place], [address] [place] [address] [phone]  Time coordinating discharge: 45 min  Signed:  Harlene RAYMOND Bowl DO  [facility] 08/22/2023, 11:16 AM

## 2023-08-22 NOTE — Care Management Obs Status (Signed)
 MEDICARE OBSERVATION STATUS NOTIFICATION   Patient Details  Name: Lydia Walsh MRN: [id number] Date of Birth: 06/01/1955   Medicare Observation Status Notification Given:  Yes    Robynn Eileen Hoose, RN 08/22/2023, 10:40 AM

## 2023-08-22 NOTE — Discharge Instructions (Signed)
STOP DIOVAN

## 2023-08-24 ENCOUNTER — Telehealth: Payer: Self-pay

## 2023-08-24 NOTE — Transitions of Care (Post Inpatient/ED Visit) (Signed)
 08/24/2023  Name: Lydia Walsh MRN: [id number] DOB: [birth year]  Today's TOC FU Call Status: Today's TOC FU Call Status:: Successful TOC FU Call Completed TOC FU Call Complete Date: 08/24/23 Patient's Name and Date of Birth confirmed.  Transition Care Management Follow-up Telephone Call Date of Discharge: 08/22/23 Discharge Facility: [facility]) Type of Discharge: Inpatient Admission Primary Inpatient Discharge Diagnosis:: angio edema How have you been since you were released from the hospital?: Better Any questions or concerns?: No  Items Reviewed: Did you receive and understand the discharge instructions provided?: Yes Medications obtained,verified, and reconciled?: Yes (Medications Reviewed) Any new allergies since your discharge?: No Dietary orders reviewed?: Yes Do you have support at home?: No  Medications Reviewed Today: Medications Reviewed Today     Reviewed by Emmitt Pan, LPN (Licensed Practical Nurse) on 08/24/23 at 1521  Med List Status: <None>   Medication Order Taking? Sig Documenting Provider Last Dose Status Informant  allopurinol  (ZYLOPRIM ) 100 MG tablet [id number]  Take 1 tablet (100 mg total) by mouth daily.  Patient not taking: Reported on 08/24/2023   Nooruddin, Saad, MD  Active   amLODipine  (NORVASC ) 10 MG tablet [id number] Yes Take 1 tablet by mouth once daily Nooruddin, Saad, MD  Active   diphenhydrAMINE  (BENADRYL  ALLERGY ) 25 mg capsule [id number] Yes Take 1 capsule (25 mg total) by mouth every 6 (six) hours as needed for allergies. Vann, Jessica U, DO  Active   EPINEPHrine  0.3 mg/0.3 mL IJ SOAJ injection [id number] Yes Inject 0.3 mg into the muscle as needed for anaphylaxis. Vann, Jessica U, DO  Active     Discontinued 04/08/11 1525 (Completed Course)   ezetimibe  (ZETIA ) 10 MG tablet [id number] Yes Take 1 tablet by mouth once daily Berry, Jonathan J, MD  Active   famotidine  (PEPCID ) 20 MG tablet [id number] Yes Take 1 tablet (20 mg total) by mouth 2  (two) times daily. Vann, Jessica U, DO  Active   fluticasone  (FLONASE ) 50 MCG/ACT nasal spray [id number] Yes PLACE 1 TO 2 SPRAYS IN EACH NOSTIL ONCE A DAY AS NEEDED FOR TREATMENT OF ALLERGIES Nooruddin, Saad, MD  Active   levothyroxine  (SYNTHROID ) 125 MCG tablet [id number] Yes Take 1 tablet by mouth once daily Nooruddin, Saad, MD  Active   metoprolol  succinate (TOPROL -XL) 50 MG 24 hr tablet [id number] Yes TAKE 3 TABLETS BY MOUTH ONCE DAILY WITH MEALS Nooruddin, Saad, MD  Active   Multiple Vitamins-Minerals (CENTRUM PO) [id number] Yes Take by mouth. [provider]  Active   omeprazole  (PRILOSEC) 40 MG capsule [id number] Yes Take 1 capsule (40 mg total) by mouth daily. Nooruddin, Saad, MD  Active   predniSONE  (DELTASONE ) 10 MG tablet [id number] Yes 40mg  for 1 day then 30mg  x 1 day then 20mg  x  day then 10mg  x 1 day then stop Vann, Jessica U, DO  Active   rosuvastatin  (CRESTOR ) 40 MG tablet [id number] Yes Take 1 tablet by mouth once daily Berry, Jonathan J, MD  Active   spironolactone  (ALDACTONE ) 25 MG tablet [id number] Yes Take 3 tablets (75 mg total) by mouth daily. Renne Homans, MD  Active             Home Care and Equipment/Supplies: Were Home Health Services Ordered?: NA Any new equipment or medical supplies ordered?: NA  Functional Questionnaire: Do you need assistance with bathing/showering or dressing?: No Do you need assistance with meal preparation?: No Do you need assistance with eating?: No Do you have difficulty maintaining continence: No Do you need assistance with  getting out of bed/getting out of a chair/moving?: No Do you have difficulty managing or taking your medications?: No  Follow up appointments reviewed: PCP Follow-up appointment confirmed?: No (sent message to staff to schedule) MD Provider Line Number:[phone] Given: No Specialist Hospital Follow-up appointment confirmed?: NA Do you need transportation to your follow-up appointment?: No Do you  understand care options if your condition(s) worsen?: Yes-patient verbalized understanding    SIGNATURE Julian Lemmings, LPN [facility] Nurse Health Advisor Direct Dial [phone]

## 2023-08-26 ENCOUNTER — Other Ambulatory Visit: Payer: Self-pay | Admitting: Student

## 2023-08-26 DIAGNOSIS — I10 Essential (primary) hypertension: Secondary | ICD-10-CM

## 2023-09-01 ENCOUNTER — Encounter: Payer: Self-pay | Admitting: Student

## 2023-09-01 ENCOUNTER — Ambulatory Visit

## 2023-09-01 VITALS — BP 136/57 | HR 60 | Temp 98.2°F | Ht 60.0 in | Wt 181.8 lb

## 2023-09-01 DIAGNOSIS — D170 Benign lipomatous neoplasm of skin and subcutaneous tissue of head, face and neck: Secondary | ICD-10-CM

## 2023-09-01 DIAGNOSIS — I10 Essential (primary) hypertension: Secondary | ICD-10-CM | POA: Diagnosis present

## 2023-09-01 DIAGNOSIS — T783XXA Angioneurotic edema, initial encounter: Secondary | ICD-10-CM | POA: Diagnosis not present

## 2023-09-01 DIAGNOSIS — K08109 Complete loss of teeth, unspecified cause, unspecified class: Secondary | ICD-10-CM

## 2023-09-01 DIAGNOSIS — M1A0710 Idiopathic chronic gout, right ankle and foot, without tophus (tophi): Secondary | ICD-10-CM

## 2023-09-01 MED ORDER — AMLODIPINE BESYLATE 5 MG PO TABS: 5.0000 mg | ORAL_TABLET | Freq: Every day | ORAL | 11 refills | Status: AC

## 2023-09-01 MED ORDER — AMLODIPINE BESYLATE 10 MG PO TABS
10.0000 mg | ORAL_TABLET | Freq: Every day | ORAL | 0 refills | Status: DC
Start: 2023-09-01 — End: 2023-09-01

## 2023-09-01 NOTE — Assessment & Plan Note (Addendum)
 Resolved. Histamine versus bradykinin mediated. Epipen  prescribed on hospital discharge. Valsartan  discontinued, allergy  updated in EMR.  Avoid ACEs and ARB's.

## 2023-09-01 NOTE — Assessment & Plan Note (Signed)
 Chronic and stable.  Referral to plastic surgery was closed after being authorized at last visit.  Not sure why.  Seems like there may have been some misunderstanding as she thought she was being referred to dermatologist.  I will place referral to dermatology for removal of the forehead lipoma today.

## 2023-09-01 NOTE — Assessment & Plan Note (Signed)
 Thinks she had a gout flare 3 months ago while at work. Right foot became hot and swollen at the toe. Restart allopurinol , brief review of literature suggests low association of angioedema with this medication.

## 2023-09-01 NOTE — Progress Notes (Signed)
 Patient name: Lydia Walsh Date of birth: Jun 12, 1955 Date of visit: 09/01/23  Subjective   Hospital follow-up: Patient was admitted to [facility] on 08/21/23 and discharged on 08/22/23. She was treated for Angioedema. Treatment for this included antihistamines and steroids, TXA. Telephone follow up was done on 08/24/23. She reports good compliance with treatment. She reports this condition is resolved.  She presented to the hospital with tongue swelling. Histamine-mediated process was posited. She also took ARB for hypertension which was stopped.  ROS No more tongue swelling. Bilateral knee pain.  Current Outpatient Medications  Medication Instructions   allopurinol  (ZYLOPRIM ) 100 mg, Oral, Daily   amLODipine  (NORVASC ) 5 mg, Oral, Daily   EPINEPHrine  (EPI-PEN) 0.3 mg, Intramuscular, As needed   ezetimibe  (ZETIA ) 10 mg, Oral, Daily   levothyroxine  (SYNTHROID ) 125 mcg, Oral, Daily   metoprolol  succinate (TOPROL -XL) 50 MG 24 hr tablet TAKE 3 TABLETS BY MOUTH ONCE DAILY WITH MEALS   Multiple Vitamins-Minerals (CENTRUM PO) Take by mouth.   omeprazole  (PRILOSEC) 40 mg, Oral, Daily   rosuvastatin  (CRESTOR ) 40 mg, Oral, Daily   spironolactone  (ALDACTONE ) 75 mg, Oral, Daily     Objective  Today's Vitals   09/01/23 0828 09/01/23 0844  BP: (!) 154/58 (!) 136/57  Pulse: 65 60  Temp: 98.2 F (36.8 C)   TempSrc: Oral   SpO2: 98%   Weight: 181 lb 12.8 oz (82.5 kg)   Height: 5' (1.524 m)   Body mass index is 35.51 kg/m.   Physical Exam Constitutional:      Appearance: Normal appearance.  HENT:     Mouth/Throat:     Mouth: Mucous membranes are moist.     Pharynx: Oropharynx is clear. No posterior oropharyngeal erythema.     Comments: Normal-appearing tongue and oropharynx Cardiovascular:     Rate and Rhythm: Normal rate and regular rhythm.  Pulmonary:     Effort: Pulmonary effort is normal. No respiratory distress.  Musculoskeletal:     Comments: Bilateral knee  tenderness No warmth erythema or effusion  Skin:    General: Skin is warm and dry.  Neurological:     Mental Status: She is alert.     Cranial Nerves: No facial asymmetry.  Psychiatric:        Mood and Affect: Affect normal.        Speech: Speech normal.        Behavior: Behavior normal.      Assessment & Plan   Angioedema of tongue Assessment & Plan: Resolved. Histamine versus bradykinin mediated. Epipen  prescribed on hospital discharge. Valsartan  discontinued, allergy  updated in EMR.  Avoid ACEs and ARB's.   Essential hypertension Assessment & Plan: Chronic, not at goal.  BP Readings from Last 3 Encounters:  09/01/23 (!) 136/57  08/22/23 (!) 153/72  08/21/23 (!) 179/80   Valsartan  stopped after hospitalization for angioedema. Hasn't been taking amlodipine  because she doesn't have this medication at home. Restart amlodipine  at 5 mg daily today. Continue toprol -xl 50 mg daily and spironolactone  75 mg daily.  Orders: -     amLODIPine  Besylate; Take 1 tablet (5 mg total) by mouth daily.  Dispense: 30 tablet; Refill: 11  Chronic idiopathic gout involving toe of right foot without tophus Assessment & Plan: Thinks she had a gout flare 3 months ago while at work. Right foot became hot and swollen at the toe. Restart allopurinol , brief review of literature suggests low association of angioedema with this medication.   Lipoma of forehead Assessment & Plan: Chronic and  stable.  Referral to plastic surgery was closed after being authorized at last visit.  Not sure why.  Seems like there may have been some misunderstanding as she thought she was being referred to dermatologist.  I will place referral to dermatology for removal of the forehead lipoma today.  Orders: -     Ambulatory referral to Dermatology  Tooth loss -     Ambulatory referral to Dentistry    Return for knee pain at next available appointment.  Ozell Kung MD 09/01/2023, 12:10 PM

## 2023-09-01 NOTE — Patient Instructions (Signed)
 Remember to bring all of the medications that you take (including over the counter medications and supplements) with you to every clinic visit.  This after visit summary is an important review of tests, referrals, and medication changes that were discussed during your visit. If you have questions or concerns, call [phone]. Outside of clinic business hours, call the main hospital at [phone] and ask the operator for the on-call internal medicine resident.   Ozell Kung MD 09/01/2023, 9:19 AM

## 2023-09-01 NOTE — Assessment & Plan Note (Addendum)
 Chronic, not at goal.  BP Readings from Last 3 Encounters:  09/01/23 (!) 136/57  08/22/23 (!) 153/72  08/21/23 (!) 179/80   Valsartan  stopped after hospitalization for angioedema. Hasn't been taking amlodipine  because she doesn't have this medication at home. Restart amlodipine  at 5 mg daily today. Continue toprol -xl 50 mg daily and spironolactone  75 mg daily.

## 2023-09-13 ENCOUNTER — Other Ambulatory Visit: Payer: Self-pay | Admitting: Cardiovascular Disease

## 2023-09-17 NOTE — Progress Notes (Signed)
 Internal Medicine Clinic Attending  Case discussed with the resident at the time of the visit.  We reviewed the resident's history and exam and pertinent patient test results.  I agree with the assessment, diagnosis, and plan of care documented in the resident's note.

## 2023-09-24 ENCOUNTER — Ambulatory Visit: Payer: Self-pay

## 2023-09-24 NOTE — Telephone Encounter (Signed)
 FYI Only or Action Required?: FYI only for provider.  Patient was last seen in primary care on 09/01/2023 by Norrine Sharper, MD.  Called Nurse Triage reporting Shortness of Breath.  Symptoms began several days ago.  Interventions attempted: Rest, hydration, or home remedies.  Symptoms are: unchanged.  Triage Disposition: See PCP When Office is Open (Within 3 Days) (overriding See HCP Within 4 Hours (Or PCP Triage))  Patient/caregiver understands and will follow disposition?:   Copied from CRM #[id number]. Topic: Clinical - Red Word Triage >> Sep 24, 2023  3:19 PM Carrielelia G wrote: [facility] that prompted transfer to Nurse Triage: sob of breath at night when sleep and hands go numb. Reason for Disposition  [1] MILD difficulty breathing (e.g., minimal/no SOB at rest, SOB with walking, pulse < 100) AND [2] NEW-onset or WORSE than normal  Answer Assessment - Initial Assessment Questions 1. RESPIRATORY STATUS: Describe your breathing? (e.g., wheezing, shortness of breath, unable to speak, severe coughing)      Mild shortness of breath at night but patient feels like she might be having acid reflux at night. Reports shortness of breath happens when she is laying on her right side.  2. ONSET: When did this breathing problem begin?      Started noticing on Wednesday 3. PATTERN Does the difficult breathing come and go, or has it been constant since it started?      Comes and goes 4. SEVERITY: How bad is your breathing? (e.g., mild, moderate, severe)      Mild at night 5. RECURRENT SYMPTOM: Have you had difficulty breathing before? If Yes, ask: When was the last time? and What happened that time?      no 6. CARDIAC HISTORY: Do you have any history of heart disease? (e.g., heart attack, angina, bypass surgery, angioplasty)      no 7. LUNG HISTORY: Do you have any history of lung disease?  (e.g., pulmonary embolus, asthma, emphysema)     no 8. CAUSE: What do you think is  causing the breathing problem?      Acid reflux 9. OTHER SYMPTOMS: Do you have any other symptoms? (e.g., chest pain, cough, dizziness, fever, runny nose)     Sore throat 10. O2 SATURATION MONITOR:  Do you use an oxygen saturation monitor (pulse oximeter) at home? If Yes, ask: What is your reading (oxygen level) today? What is your usual oxygen saturation reading? (e.g., 95%)       NA 12. TRAVEL: Have you traveled out of the country in the last month? (e.g., travel history, exposures)       No  Patient reports that since Wednesday she has been woken up during night due to shortness of breath. Patient is questioning if she was having acid reflux. Discussed with patient about not eating 2-3 hours before bedtime. Elevating his head with pillows. Patient verbalized understanding. Patient is asking for an appointment to be evaluated, preferable in the morning. Acute appointment scheduled for 09/28/2023 at 8:15 AM  Protocols used: Breathing Difficulty-A-AH

## 2023-09-27 NOTE — Telephone Encounter (Signed)
 Pt's appt is 9/16 w/Dr Kandis; the first available appt. I called pt who stated she's feelingpretty good; stated she will be ok until her appt tomorrow.

## 2023-09-28 ENCOUNTER — Ambulatory Visit (INDEPENDENT_AMBULATORY_CARE_PROVIDER_SITE_OTHER): Payer: Self-pay | Admitting: Student

## 2023-09-28 VITALS — BP 130/60 | HR 69 | Temp 98.1°F | Ht 60.0 in | Wt 181.2 lb

## 2023-09-28 DIAGNOSIS — G4733 Obstructive sleep apnea (adult) (pediatric): Secondary | ICD-10-CM | POA: Diagnosis present

## 2023-09-28 DIAGNOSIS — R1310 Dysphagia, unspecified: Secondary | ICD-10-CM

## 2023-09-28 DIAGNOSIS — M170 Bilateral primary osteoarthritis of knee: Secondary | ICD-10-CM

## 2023-09-28 DIAGNOSIS — K219 Gastro-esophageal reflux disease without esophagitis: Secondary | ICD-10-CM | POA: Diagnosis not present

## 2023-09-28 MED ORDER — DICLOFENAC SODIUM 1 % EX GEL: 4.0000 g | Freq: Four times a day (QID) | CUTANEOUS | 3 refills | Status: DC

## 2023-09-28 NOTE — Patient Instructions (Signed)
 Thank you, [name] for allowing us  to provide your care today. Today we discussed knee pain, shortness of breath at night, and heart burn.     For the heart burn and pills getting stuck: please see that we referred you to a stomach doctor.  For the  knee pain: do NOT take NSAIDs. Please attend physical therapy and use voltaren  gel and take tylenol  as needed (no more than 4,000 mg a day).  For the shortness of breath at night: This is likely from your sleep apnea. Please see that we sent a referral to the  sleep doctors.    Referrals ordered today:    Referral Orders         Ambulatory referral to Sleep Studies         Ambulatory referral to Gastroenterology         Ambulatory referral to Physical Therapy      I have ordered the following medication/changed the following medications:   Stop the following medications: There are no discontinued medications.   Start the following medications: Meds ordered this encounter  Medications   diclofenac  Sodium (VOLTAREN ) 1 % GEL    Sig: Apply 4 g topically 4 (four) times daily.    Dispense:  120 g    Refill:  3     Follow up: as needed    Should you have any questions or concerns please call the internal medicine clinic at [phone].     Please note that our late policy has changed.  If you are more than 15 minutes late to your appointment, you may be asked to reschedule your appointment.  Dr. Kandis, D.O. [facility]

## 2023-09-28 NOTE — Progress Notes (Unsigned)
 Established Patient Office Visit  Subjective   Patient ID: Lydia Walsh, female    DOB: 10/07/1955  Age: 68 y.o. MRN: [id number]  Chief Complaint  Patient presents with  . Gastroesophageal Reflux    Pt think acid reflux cause her to have SOB.   Lydia Walsh Knee Pain    Lydia Walsh is a 68 y.o. who presents to the clinic for ***. Please see problem based assessment and plan for additional details.   SOB: Patient reports that since Wednesday she has been woken up during night due to shortness of breath. Patient is questioning if she was having acid reflux. Discussed with patient about not eating 2-3 hours before bedtime. Elevating his head with pillows. Patient verbalized understanding. Patient is asking for an appointment to be evaluated, preferable in the morning. Acute appointment scheduled for 09/28/2023 at 8:15 AM   SOB: A month ago Wakes up in her sleep to catch her breath No SOB with walking Stable edema  No COPD or asthma  No new cough or chest pain  Wheezing? No   Acid taste in her mouth   OSA: not on a CPAP: split study never done  Moderate OSA in 2007, 2010 Never had a CPAP. Need reevaluation New sleep study! For CPAP    HB: On omeprazole  40 mg, helped for some time, helps  -pill felt like it got stuck  -dysphagia, food: not liquids  -couple months  -no other red flag sx   GI Protonix    Right knee pain> Left pain Xray 2023 Severe patellofemoral and moderate medial greater than lateral compartment of the knee osteoarthritis.   PT Celebrex? Voltaren  gel    Patient Active Problem List   Diagnosis Date Noted  . Lipoma of forehead 07/15/2023  . Diarrhea with dehydration 07/15/2023  . Osteoporosis 02/23/2022  . Primary osteoarthritis of knees, bilateral 11/18/2016  . AV block, 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  . Renal mass, left 06/21/2015  . Obesity (BMI 30-39.9) 06/21/2015  . Osteoarthritis   . GOUT 11/24/2007  . G E R D 02/19/2006  . Hypothyroidism 02/17/2006   . Dyslipidemia 02/17/2006  . Essential hypertension 02/17/2006  . OBSTRUCTIVE SLEEP APNEA 06/21/2005      Objective:     BP 130/60 (BP Location: Right Arm, Patient Position: Sitting, Cuff Size: Normal)   Pulse 69   Temp 98.1 F (36.7 C) (Oral)   Ht 5' (1.524 m)   Wt 181 lb 3.2 oz (82.2 kg)   LMP 07/16/2014   SpO2 98%   BMI 35.39 kg/m  BP Readings from Last 3 Encounters:  09/28/23 130/60  09/01/23 (!) 136/57  08/22/23 (!) 153/72   Wt Readings from Last 3 Encounters:  09/28/23 181 lb 3.2 oz (82.2 kg)  09/01/23 181 lb 12.8 oz (82.5 kg)  08/22/23 181 lb 14.1 oz (82.5 kg)      Physical Exam   No results found for any visits on 09/28/23.  Last metabolic panel Lab Results  Component Value Date   GLUCOSE 215 (H) 08/22/2023   NA 137 08/22/2023   K 3.4 (L) 08/22/2023   CL 101 08/22/2023   CO2 23 08/22/2023   BUN 15 08/22/2023   CREATININE 0.84 08/22/2023   GFRNONAA >60 08/22/2023   CALCIUM  10.3 08/22/2023   PROT 7.0 08/22/2023   ALBUMIN 3.6 08/22/2023   LABGLOB 2.6 04/01/2021   AGRATIO 1.8 04/01/2021   BILITOT 0.7 08/22/2023   ALKPHOS 86 08/22/2023   AST 46 (H) 08/22/2023  ALT 61 (H) 08/22/2023   ANIONGAP 13 08/22/2023   Last lipids Lab Results  Component Value Date   CHOL 136 05/21/2022   HDL 59 05/21/2022   LDLCALC 62 05/21/2022   TRIG 78 05/21/2022   CHOLHDL 2.3 05/21/2022   Last hemoglobin A1c Lab Results  Component Value Date   HGBA1C 5.6 11/02/2011      The 10-year ASCVD risk score (Arnett DK, et al., 2019) is: 8.8%    Assessment & Plan:   Problem List Items Addressed This Visit   None   No follow-ups on file.    Damien Lease, DO

## 2023-09-29 MED ORDER — OMEPRAZOLE 40 MG PO CPDR: 40.0000 mg | DELAYED_RELEASE_CAPSULE | Freq: Two times a day (BID) | ORAL | 1 refills | Status: DC

## 2023-09-29 NOTE — Assessment & Plan Note (Signed)
 Patient reports a 1 month history of apneic episodes in her sleep causing her to wake up to catch her breath.  She denies any shortness of breath throughout the day or dyspnea with exertion.  She denies shortness of breath at the moment.  She denies wheezing, new cough, chest pain.  She does not have a history of COPD or asthma. On exam, patient's lungs are clear to auscultate bilaterally.  With her history of OSA not on CPAP, and a suspicion that patient's nocturnal shortness of breath is due to untreated OSA.  Her last sleep study was performed in 2010.  Plan: - Ambulatory referral to sleep medicine sent

## 2023-09-29 NOTE — Assessment & Plan Note (Addendum)
 Patient reports gradual worsening of her GERD over the past few months.  She reported that her daily omeprazole  40 mg is no longer helping.  When reviewing for alarm symptoms, she denied weight loss, melena, bright red blood per rectum.  The patient did report dysphagia with pills and solid foods.  She denies dysphagia with liquids.  Plan: - Referral to GI was sent, patient will likely need endoscopy for further evaluation of dysphagia and worsening GERD - Will increase her omeprazole  to 40 mg twice daily, and spoke with patient on phone and she requested a refill for omeprazole  that she can take it BID, refill sent in

## 2023-09-29 NOTE — Assessment & Plan Note (Signed)
 Patient has a history of right knee osteoarthritis and left knee pain.  In 2023, an x-ray showed severe patellofemoral and moderate medial greater than left compartment of the knee osteoarthritis of the right knee.  On exam, patient has intact range of motion of the bilateral knees, but does have bilateral crepitus and pain with when palpating the joint lines of the right knee.   Plan: - Physical therapy for osteoarthritis -Will hold off on any NSAIDs at this time with history of worsening GERD and dysphagia - Voltaren  gel prescribed

## 2023-09-30 NOTE — Progress Notes (Signed)
 Internal Medicine Clinic Attending  Case discussed with the resident at the time of the visit.  We reviewed the resident's history and exam and pertinent patient test results.  I agree with the assessment, diagnosis, and plan of care documented in the resident's note.

## 2023-10-01 ENCOUNTER — Encounter: Payer: Self-pay | Admitting: Emergency Medicine

## 2023-10-01 ENCOUNTER — Ambulatory Visit (HOSPITAL_COMMUNITY)
Admit: 2023-10-01 | Discharge: 2023-10-01 | Disposition: A | Discharge status: Home / Self Care | Attending: Family Medicine | Admitting: Family Medicine

## 2023-10-01 ENCOUNTER — Other Ambulatory Visit: Payer: Self-pay

## 2023-10-01 ENCOUNTER — Ambulatory Visit
Admission: EM | Admit: 2023-10-01 | Discharge: 2023-10-01 | Disposition: A | Discharge status: Home / Self Care | Attending: Physician Assistant | Admitting: Physician Assistant

## 2023-10-01 DIAGNOSIS — L821 Other seborrheic keratosis: Secondary | ICD-10-CM | POA: Insufficient documentation

## 2023-10-01 DIAGNOSIS — R002 Palpitations: Secondary | ICD-10-CM | POA: Diagnosis not present

## 2023-10-01 DIAGNOSIS — L811 Chloasma: Secondary | ICD-10-CM | POA: Insufficient documentation

## 2023-10-01 DIAGNOSIS — R079 Chest pain, unspecified: Secondary | ICD-10-CM | POA: Insufficient documentation

## 2023-10-01 NOTE — Discharge Instructions (Signed)
 Go to ED for further evaluation of chest pain and palpiations

## 2023-10-01 NOTE — ED Provider Notes (Signed)
 [facility]    CSN: [id number] Arrival date & time: 10/01/23  0943      History   Chief Complaint Chief Complaint  Patient presents with   Chest Pain   Palpitations    HPI Lydia Walsh is a 68 y.o. female.   Patient here c/w chest pain and palpations x last night. Has history of GERD and a number of other medical conditions, see below. States they happened every time she laid down to sleep  and that she hasn't slept since last night.  No sx now unless she lays down.  Denies syncope, lightheadedness, dizziness, SOB, n/v/d.  She reports she had similar sx earlier this year.  Sees cardiology due to murmur and palpitations.    Past Medical History:  Diagnosis Date   Anemia    Angioedema of tongue 08/21/2023   Back pain    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HCC) 07/10/2015   Cystitis 03/11/2023   Cystitis, acute 03/11/2023   Endometrial cancer (HCC)    Endometrial polyp    GERD (gastroesophageal reflux disease)    Gout    Heart murmur    Heart palpitations    History of blood transfusion    History of chemotherapy    History of radiation therapy 09/02/2015-10/24/2015   Endometrium 10/15/15- 10/24/15 HDR Treatment, 09/02/15-10/08/15 50 Gy pelvic radiation; Dr. 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  Alendronate  was sent in, however patient did not receive it.   Could be secondary to older age, as well as cancer treatment years ago.  Also, vitamin D  levels at low normal at 34.  Given osteoporosis confirmed, will also supplement with vitamin D .  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   Renal mass, left 06/21/2015   Obesity (BMI 30-39.9) 06/21/2015   Osteoarthritis    GOUT 11/24/2007   G E R D 02/19/2006   Hypothyroidism 02/17/2006   Dyslipidemia 02/17/2006   Essential hypertension 02/17/2006   OBSTRUCTIVE SLEEP APNEA 06/21/2005    Past Surgical History:  Procedure Laterality Date   ABDOMINAL HYSTERECTOMY     BREAST EXCISIONAL BIOPSY Right    benign   CHOLECYSTECTOMY     CHOLECYSTECTOMY, LAPAROSCOPIC  2020   COLONOSCOPY  DILATATION & CURETTAGE/HYSTEROSCOPY WITH MYOSURE N/A 03/07/2015   Procedure: DILATATION & CURETTAGE/HYSTEROSCOPY WITH MYOSURE (POLYP);  Surgeon: Norleen Skill, MD;  Location: [facility];  Service: Gynecology;  Laterality: N/A;   DILATATION & CURRETTAGE/HYSTEROSCOPY WITH RESECTOCOPE  12-13-2002  &  09-26-2004   polyp/  submucosal fibroid   ESOPHAGOGASTRODUODENOSCOPY     EXCISION BENIGN RIGHT BREAST MASS  age 89   IR REMOVAL TUN ACCESS W/ PORT W/O FL MOD SED  07/17/2016   KNEE ARTHROSCOPY W/ MENISCECTOMY Bilateral right 04-12-2006//  left 12-29-2006   and Chondroplasty   port placement     ROBOTIC ASSISTED TOTAL HYSTERECTOMY WITH  BILATERAL SALPINGO OOPHERECTOMY Bilateral 05/02/2015   Procedure: XI ROBOTIC ASSISTED TOTAL HYSTERECTOMY WITH BILATERAL SALPINGO OOPHORECTOMY AND SENTINAL LYMPH NODE BIOPSY;  Surgeon: Sari Bachelor, MD;  Location: [facility];  Service: Gynecology;  Laterality: Bilateral;   TRANSTHORACIC ECHOCARDIOGRAM  04-02-2010   normal LV, ef 65%   TUBAL LIGATION  1980's    OB History     Gravida  3   Para  3   Term      Preterm      AB      Living         SAB      IAB      Ectopic      Multiple      Live Births               Home Medications    Prior to Admission medications   Medication Sig Start Date End Date Taking? Authorizing Provider  amLODipine  (NORVASC ) 5 MG tablet Take 1 tablet (5 mg total) by mouth daily. 09/01/23 08/31/24 Yes Norrine Sharper, MD  ezetimibe  (ZETIA ) 10 MG tablet Take 1 tablet by mouth once daily 09/15/23  Yes Court Dorn PARAS, MD  levothyroxine  (SYNTHROID ) 125 MCG tablet Take 1 tablet by mouth once daily 08/13/23  Yes Nooruddin, Saad, MD  metoprolol  succinate (TOPROL -XL) 50 MG 24 hr tablet TAKE 3 TABLETS BY MOUTH ONCE DAILY WITH MEALS 03/22/23  Yes Nooruddin, Saad, MD  Multiple Vitamins-Minerals (CENTRUM PO) Take by mouth.   Yes [provider]  omeprazole  (PRILOSEC) 40 MG capsule Take 1 capsule (40 mg total) by mouth 2 (two) times daily. 09/29/23  Yes Kandis Perkins, DO  rosuvastatin  (CRESTOR ) 40 MG tablet Take 1 tablet by mouth once daily 05/18/23  Yes Court Dorn PARAS, MD  spironolactone  (ALDACTONE ) 25 MG tablet Take 3 tablets (75 mg total) by mouth daily. 03/11/23  Yes Amoako, Prince, MD  allopurinol  (ZYLOPRIM ) 100 MG tablet Take 1 tablet (100 mg total) by mouth daily. Patient not taking: Reported on 09/01/2023 07/05/23   Nooruddin, Saad, MD  diclofenac  Sodium (VOLTAREN ) 1 % GEL Apply 4 g topically 4 (four) times daily. Patient not taking: Reported on 10/01/2023 09/28/23   Kandis Perkins, DO  EPINEPHrine  0.3 mg/0.3 mL IJ SOAJ injection Inject 0.3 mg  into the muscle as needed for anaphylaxis. 08/22/23   Vann, Jessica U, DO  esomeprazole  (NEXIUM ) 20 MG capsule Take 2 capsules (40 mg total) by mouth daily before breakfast. 10/15/10 04/08/11  Illath, Jaseela, MD    Family History Family History  Problem Relation Age of Onset   Hypertension Mother    Heart failure Mother    Sudden death Mother    Stroke Mother    Cirrhosis Father        alcoholic   Liver disease Father    Alcoholism Father    Heart disease  Son    Hypertension Son    Colon cancer Neg Hx    Stomach cancer Neg Hx    Rectal cancer Neg Hx    Esophageal cancer Neg Hx    Liver cancer Neg Hx     Social History Social History   Tobacco Use   Smoking status: Former    Current packs/day: 0.00    Types: Cigarettes    Start date: 02/29/1992    Quit date: 02/28/1994    Years since quitting: 29.6    Passive exposure: Past   Smokeless tobacco: Never  Vaping Use   Vaping status: Never Used  Substance Use Topics   Alcohol use: No    Alcohol/week: 0.0 standard drinks of alcohol   Drug use: No     Allergies   Diovan  [valsartan ], Shrimp (diagnostic), and Hctz [hydrochlorothiazide ]   Review of Systems Review of Systems  Constitutional:  Negative for chills, fatigue and fever.  HENT:  Negative for congestion, ear pain, nosebleeds, postnasal drip, rhinorrhea, sinus pressure, sinus pain and sore throat.   Eyes:  Negative for pain and redness.  Respiratory:  Negative for cough, shortness of breath and wheezing.   Cardiovascular:  Positive for chest pain and palpitations. Negative for leg swelling.  Gastrointestinal:  Negative for abdominal pain, diarrhea, nausea and vomiting.  Musculoskeletal:  Negative for arthralgias and myalgias.  Skin:  Negative for rash.  Neurological:  Negative for light-headedness and headaches.  Hematological:  Negative for adenopathy. Does not bruise/bleed easily.  Psychiatric/Behavioral:  Negative for confusion and sleep disturbance.       Physical Exam Triage Vital Signs ED Triage Vitals  Encounter Vitals Group     BP 10/01/23 0959 (!) 152/82     Girls Systolic BP Percentile --      Girls Diastolic BP Percentile --      Boys Systolic BP Percentile --      Boys Diastolic BP Percentile --      Pulse Rate 10/01/23 0959 76     Resp 10/01/23 0959 20     Temp 10/01/23 0959 98 F (36.7 C)     Temp Source 10/01/23 0959 Oral     SpO2 10/01/23 0959 95 %     Weight --      Height --      Head Circumference --      Peak Flow --      Pain Score 10/01/23 1001 0     Pain Loc --      Pain Education --      Exclude from Growth Chart --    No data found.  Updated Vital Signs BP 134/84 (BP Location: Left Arm)   Pulse 76   Temp 98 F (36.7 C) (Oral)   Resp 20   LMP 07/16/2014   SpO2 95%   Visual Acuity Right Eye Distance:   Left Eye Distance:   Bilateral Distance:    Right Eye Near:   Left Eye Near:    Bilateral Near:     Physical Exam Vitals and nursing note reviewed.  Constitutional:      General: She is not in acute distress.    Appearance: Normal appearance. She is not ill-appearing.  HENT:     Head: Normocephalic and atraumatic.  Eyes:     General: No scleral icterus.    Extraocular Movements: Extraocular movements intact.     Conjunctiva/sclera: Conjunctivae normal.  Cardiovascular:     Rate and Rhythm: Normal rate and regular rhythm.  Occasional Extrasystoles (when supine) are present.    Heart sounds: Normal heart sounds.  Pulmonary:     Effort: Pulmonary effort is normal. No respiratory distress.  Musculoskeletal:        General: Normal range of motion.     Cervical back: Normal range of motion. No rigidity.  Skin:    General: Skin is warm.     Coloration: Skin is not jaundiced.     Findings: No rash.  Neurological:     General: No focal deficit present.     Mental Status: She is alert and oriented to person, place, and time.     Motor: No weakness.     Gait: Gait normal.   Psychiatric:        Mood and Affect: Mood normal.        Behavior: Behavior normal.      UC Treatments / Results  Labs (all labs ordered are listed, but only abnormal results are displayed) Labs Reviewed - No data to display  EKG   Radiology No results found.  Procedures Procedures (including critical care time)  Medications Ordered in UC Medications - No data to display  Initial Impression / Assessment and Plan / UC Course  I have reviewed the triage vital signs and the nursing notes.  Pertinent labs & imaging results that were available during my care of the patient were reviewed by me and considered in my medical decision making (see chart for details).     VSS NAD ECG unchanged from ECG taken 8/9 Possible feeling sx due to GERD, however, recommend ED evaluation due to chest pain and palpations Patient declined, stated she would contact her cardiologist I explained risks of not going to ED, including disability or death.  Patient expressed understanding. Final Clinical Impressions(s) / UC Diagnoses   Final diagnoses:  Palpitations  Chest pain, unspecified type     Discharge Instructions      Go to ED for further evaluation of chest pain and palpiations    ED Prescriptions   None    PDMP not reviewed this encounter.   Juleen Rush, PA-C 10/01/23 1820

## 2023-10-01 NOTE — ED Triage Notes (Signed)
 Pt reports dull chest pain (midchest) and heart palpitations that occurred last night for 3hrs. No longer having symptoms. Denies SOB, dizziness, or lightheadedness currently and at time of onset last night. Pt notes her hands were tingling during this episode last night. No med use for symptoms.

## 2023-10-13 ENCOUNTER — Ambulatory Visit: Payer: Self-pay

## 2023-10-13 ENCOUNTER — Encounter

## 2023-10-13 NOTE — Telephone Encounter (Signed)
 Ok to leave patient on Dr. Athena schedule due to current symptoms per Research officer, political party.

## 2023-10-13 NOTE — Telephone Encounter (Signed)
 FYI Only or Action Required?: FYI only for provider.  Patient was last seen in primary care on 09/28/2023 by Kandis Perkins, DO.  Called Nurse Triage reporting Knee Pain.  Symptoms began 2 days ago.  Symptoms are: unchanged.  Triage Disposition: See PCP When Office is Open (Within 3 Days)  Patient/caregiver understands and will follow disposition?: Yes  First available appointment scheduled for 10/6     Copied from CRM #[id number]. Topic: Clinical - Red Word Triage >> Oct 13, 2023 11:18 AM [name] ORN wrote: Red Word that prompted transfer to Nurse Triage: patient both of knees have swelling all the time popping , severe pain when getting of car and getting off sofa example     Reason for Disposition  [1] MODERATE pain (e.g., interferes with normal activities, limping) AND [2] present > 3 days  Answer Assessment - Initial Assessment Questions 1. LOCATION and RADIATION: Where is the pain located?      Bilateral knees  2. QUALITY: What does the pain feel like?  (e.g., sharp, dull, aching, burning)     Aching  3. SEVERITY: How bad is the pain? What does it keep you from doing?   (Scale 1-10; or mild, moderate, severe)     9/10 when present  4. ONSET: When did the pain start? Does it come and go, or is it there all the time?     2 days ago  5. RECURRENT: Have you had this pain before? If Yes, ask: When, and what happened then?     Once in the past but not this bad  6. SETTING: Has there been any recent work, exercise or other activity that involved that part of the body?      No 7. AGGRAVATING FACTORS: What makes the knee pain worse? (e.g., walking, climbing stairs, running)     Standing and getting out of the car 8. ASSOCIATED SYMPTOMS: Is there any swelling or redness of the knee?     Swelling of legs but that is not a new symptom  9. OTHER SYMPTOMS: Do you have any other symptoms? (e.g., calf pain, chest pain, difficulty breathing, fever)      No  Protocols used: Knee Pain-A-AH

## 2023-10-18 ENCOUNTER — Encounter: Payer: Self-pay | Admitting: Internal Medicine

## 2023-10-18 ENCOUNTER — Ambulatory Visit: Payer: Self-pay | Admitting: Internal Medicine

## 2023-10-18 VITALS — BP 137/57 | HR 77 | Temp 97.8°F | Ht 60.0 in | Wt 178.4 lb

## 2023-10-18 DIAGNOSIS — Z87891 Personal history of nicotine dependence: Secondary | ICD-10-CM

## 2023-10-18 DIAGNOSIS — M170 Bilateral primary osteoarthritis of knee: Secondary | ICD-10-CM

## 2023-10-18 DIAGNOSIS — T783XXA Angioneurotic edema, initial encounter: Secondary | ICD-10-CM | POA: Diagnosis present

## 2023-10-18 DIAGNOSIS — Z79899 Other long term (current) drug therapy: Secondary | ICD-10-CM | POA: Diagnosis not present

## 2023-10-18 DIAGNOSIS — Z8249 Family history of ischemic heart disease and other diseases of the circulatory system: Secondary | ICD-10-CM

## 2023-10-18 DIAGNOSIS — I10 Essential (primary) hypertension: Secondary | ICD-10-CM

## 2023-10-18 MED ORDER — SPIRONOLACTONE 25 MG PO TABS: 75.0000 mg | ORAL_TABLET | Freq: Every day | ORAL | 0 refills | Status: AC

## 2023-10-18 MED ORDER — PREDNISONE 20 MG PO TABS: 40.0000 mg | ORAL_TABLET | Freq: Every day | ORAL | 0 refills | Status: AC

## 2023-10-18 MED ORDER — DIPHENHYDRAMINE HCL 25 MG PO CAPS: 25.0000 mg | ORAL_CAPSULE | Freq: Four times a day (QID) | ORAL | Status: AC | PRN

## 2023-10-18 MED ORDER — LEVOTHYROXINE SODIUM 125 MCG PO TABS: 125.0000 ug | ORAL_TABLET | Freq: Every day | ORAL | 3 refills | Status: AC

## 2023-10-18 NOTE — Assessment & Plan Note (Signed)
 Orders:    Ambulatory referral to Orthopedic Surgery

## 2023-10-18 NOTE — Assessment & Plan Note (Signed)
  Orders:   spironolactone  (ALDACTONE ) 25 MG tablet; Take 3 tablets (75 mg total) by mouth daily.

## 2023-10-18 NOTE — Progress Notes (Unsigned)
 Subjective:  HPI: Chief Complaint  Patient presents with  . Knee Pain    Popping also; both knees.  . Oral Swelling    Discussed the use of AI scribe software for clinical note transcription with the patient, who gave verbal consent to proceed.  History of Present Illness Lydia Walsh is a 68 year old female with a history of tongue swelling who presents with recurrent tongue swelling.  She woke up this morning with swelling on the left side of her tongue. The swelling is not painful and has occurred previously. She did not eat breakfast today and had leftover pizza last night. No new medications or exposure to shrimp, to which she is allergic.  Her first episode of tongue swelling occurred in 2013, leading to an emergency department visit. A similar episode happened a month ago, resulting in hospitalization. During that visit, she received shots and fluids for dehydration and was advised to stop allopurinol , which she has since discontinued.  She has been prescribed an EpiPen  and was instructed on its use, although she did not use it this morning. She has a history of anemia. Her mother also had anemia. Previous treatment with prednisone  and Benadryl  was effective.  She is currently taking rosuvastatin , amlodipine , levothyroxine , and metoprolol . No skin rash or recent infections.  She reports knee pain with popping and fluid accumulation. She previously received cortisone shots from Dr. Yvone, which provided relief, but has not seen him in years due to logistical issues.     Please see Assessment and Plan below for the status of her chronic medical problems.  Objective:  Physical Exam: Vitals:   10/18/23 1103 10/18/23 1144  BP: (!) 146/69 (!) 137/57  Pulse: 81 77  Temp: 97.8 F (36.6 C)   TempSrc: Oral   SpO2: 100%   Weight: 178 lb 6.4 oz (80.9 kg)   Height: 5' (1.524 m)    Body mass index is 34.84 kg/m. Physical Exam Results   No results found for any visits on  10/18/23.  The 10-year ASCVD risk score (Arnett DK, et al., 2019) is: 9.7%  Assessment & Plan:  See Encounters Tab for problem based charting. Assessment & Plan Angioedema of tongue  Orders: .  Ambulatory referral to Allergy   Essential hypertension  Orders: .  spironolactone  (ALDACTONE ) 25 MG tablet; Take 3 tablets (75 mg total) by mouth daily.  Primary osteoarthritis of knees, bilateral  Orders: .  Ambulatory referral to Orthopedic Surgery    Medications Ordered Meds ordered this encounter  Medications  . predniSONE  (DELTASONE ) 20 MG tablet    Sig: Take 2 tablets (40 mg total) by mouth daily with breakfast for 5 days.    Dispense:  10 tablet    Refill:  0  . diphenhydrAMINE  (BENADRYL  ALLERGY ) 25 mg capsule    Sig: Take 1 capsule (25 mg total) by mouth every 6 (six) hours as needed.  . spironolactone  (ALDACTONE ) 25 MG tablet    Sig: Take 3 tablets (75 mg total) by mouth daily.    Dispense:  270 tablet    Refill:  0  . levothyroxine  (SYNTHROID ) 125 MCG tablet    Sig: Take 1 tablet (125 mcg total) by mouth daily.    Dispense:  90 tablet    Refill:  3   Other Orders Orders Placed This Encounter  Procedures  . Ambulatory referral to Allergy     Referral Priority:   Routine    Referral Type:   Allergy  Testing  Referral Reason:   Specialty Services Required    Requested Specialty:   Allergy     Number of Visits Requested:   1  . Ambulatory referral to Orthopedic Surgery    Referral Priority:   Routine    Referral Type:   Surgical    Referral Reason:   Specialty Services Required    Requested Specialty:   Orthopedic Surgery    Number of Visits Requested:   1   Follow Up: Return in about 3 months (around 01/18/2024), or if symptoms worsen or fail to improve.

## 2023-11-04 ENCOUNTER — Ambulatory Visit: Payer: Self-pay

## 2023-11-04 NOTE — Telephone Encounter (Signed)
 FYI Only or Action Required?: FYI only for provider.  Patient was last seen in primary care on 10/18/2023 by Rosan Dayton BROCKS, DO.  Called Nurse Triage reporting Leg Swelling.  Symptoms began several months ago.  Interventions attempted: OTC medications: Ibuprofen .  Symptoms are: unchanged.  Triage Disposition: See Physician Within 24 Hours  Patient/caregiver understands and will follow disposition?: Yes  Copied from CRM #[id number]. Topic: Clinical - Red Word Triage >> Nov 04, 2023  3:01 PM [name] ORN wrote: Red Word that prompted transfer to Nurse Triage: swelling and fluid up in both legs rate pain 9 , also have forehead have a knot thats been there for a while rate pain 5 Reason for Disposition  [1] MODERATE leg swelling (e.g., swelling extends up to knees) AND [2] new-onset or getting worse  Answer Assessment - Initial Assessment Questions 1. ONSET: When did the swelling start? (e.g., minutes, hours, days)     2 months ago, getting worse  2. LOCATION: What part of the leg is swollen?  Are both legs swollen or just one leg?     Both legs, foot up to the knee  3. SEVERITY: How bad is the swelling? (e.g., localized; mild, moderate, severe)     Mild to moderate  4. REDNESS: Is there redness or signs of infection?     No  5. PAIN: Is the swelling painful to touch? If Yes, ask: How painful is it?   (Scale 1-10; mild, moderate or severe)     9/10- throbbing even when not touching  6. FEVER: Do you have a fever? If Yes, ask: What is it, how was it measured, and when did it start?      No  7. CAUSE: What do you think is causing the leg swelling?     Unsure of cause  8. MEDICAL HISTORY: Do you have a history of blood clots (e.g., DVT), cancer, heart failure, kidney disease, or liver failure?     No  9. RECURRENT SYMPTOM: Have you had leg swelling before? If Yes, ask: When was the last time? What happened that time?     Pt reports that she has always  had swelling in legs  10. OTHER SYMPTOMS: Do you have any other symptoms? (e.g., chest pain, difficulty breathing)       No  11. PREGNANCY: Is there any chance you are pregnant? When was your last menstrual period?       No  Protocols used: Leg Swelling and Edema-A-AH

## 2023-11-04 NOTE — Telephone Encounter (Signed)
 I talked to pt who stated the swelling of her legs has increased and having some pain. Denies sob. She has an appt tomorrow 10/23 @ [phone] - stated she's having trouble with low tires, requesting a change in appt time. Appt changed to 0945 AM w/Dr Nooruddin.

## 2023-11-05 ENCOUNTER — Ambulatory Visit: Payer: Self-pay | Admitting: Student

## 2023-11-05 ENCOUNTER — Ambulatory Visit: Admitting: Student

## 2023-11-08 ENCOUNTER — Ambulatory Visit (INDEPENDENT_AMBULATORY_CARE_PROVIDER_SITE_OTHER): Admitting: Student

## 2023-11-08 VITALS — BP 144/60 | HR 64 | Temp 97.7°F | Ht 60.0 in | Wt 179.4 lb

## 2023-11-08 DIAGNOSIS — I10 Essential (primary) hypertension: Secondary | ICD-10-CM | POA: Diagnosis not present

## 2023-11-08 DIAGNOSIS — K219 Gastro-esophageal reflux disease without esophagitis: Secondary | ICD-10-CM

## 2023-11-08 DIAGNOSIS — K760 Fatty (change of) liver, not elsewhere classified: Secondary | ICD-10-CM

## 2023-11-08 DIAGNOSIS — Z79899 Other long term (current) drug therapy: Secondary | ICD-10-CM | POA: Diagnosis not present

## 2023-11-08 DIAGNOSIS — M170 Bilateral primary osteoarthritis of knee: Secondary | ICD-10-CM | POA: Diagnosis present

## 2023-11-08 MED ORDER — NAPROXEN 500 MG PO TABS: 500.0000 mg | ORAL_TABLET | Freq: Two times a day (BID) | ORAL | 0 refills | Status: AC

## 2023-11-08 MED ORDER — DICLOFENAC SODIUM 1 % EX GEL: 4.0000 g | Freq: Four times a day (QID) | CUTANEOUS | 3 refills | Status: DC

## 2023-11-08 NOTE — Assessment & Plan Note (Addendum)
 Chronic severe osteoarthritis confirmed by x-ray, significantly impairing mobility and work capability. Previous steroid injections provided relief. Financial issues delaying orthopedic consultation, which she states that she will straighten out. Did offer to refer to a separate office however she is not interested at the moment. Referral is placed for physical therapy, attempted to call in clinic but no answer, provided her phone number. - Provide orthopedic office contact for Dr. Yvone appointment. - Offer in-office steroid injections if orthopedic appointment delayed. - Initiate short course of naproxen , discussed reflux exacerbation and long-term risks. - Prescribe Voltaren  gel for topical pain relief.

## 2023-11-08 NOTE — Assessment & Plan Note (Signed)
 Intermittent symptoms improved with lifestyle changes. Managed with omeprazole . Discussed potential exacerbation with certain pain medications.  Has not followed up with GI yet, I provided her the phone number.  Her symptoms are improving with her omeprazole  40 mg twice a day.

## 2023-11-08 NOTE — Assessment & Plan Note (Signed)
 Blood pressure elevated at 144/60 mmHg, previously 130-140 mmHg systolic. Managed with spironolactone , amlodipine , and metoprolol .

## 2023-11-08 NOTE — Progress Notes (Signed)
 CC: Knee Osteoarthritis  HPI:  Lydia Walsh is a 68 y.o. female living with a history stated below and presents today for knee osteoarthritis. Please see problem based assessment and plan for additional details.  Discussed the use of AI scribe software for clinical note transcription with the patient, who gave verbal consent to proceed.  History of Present Illness Lydia Walsh is a 68 year old female with severe osteoarthritis who presents with bilateral knee pain.  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 When she needs to stand, she requires assistance due to pain and stiffness in her knees.  She has a history of receiving steroid injections in her knees, which previously provided significant relief. However, she has not been able to schedule an appointment with the orthopedic office due to financial constraints. Her last x-ray in February 2023 was of her right knee.  In addition to her knee pain, she experiences occasional reflux and a sensation of something in her throat, which she attributes to eating late at night. Avoiding late-night meals has alleviated her reflux symptoms. She is currently taking omeprazole  for reflux, Zetia  and rosuvastatin  for cholesterol, levothyroxine  for thyroid , and spironolactone  and metoprolol  for blood pressure.  She has tried physical therapy in the past and has used a spray-on product for her knees, which provided some relief. She has not used any knee gels or muscle relaxers recently.    Past Medical History:  Diagnosis Date   Anemia    Angioedema of tongue 08/21/2023   Back pain    Chemotherapy induced neutropenia 07/10/2015   Cystitis 03/11/2023   Cystitis, acute 03/11/2023   Endometrial cancer (HCC)    Endometrial polyp    GERD (gastroesophageal reflux disease)     Gout    Heart murmur    Heart palpitations    History of blood transfusion    History of chemotherapy    History of radiation therapy 09/02/2015-10/24/2015   Endometrium 10/15/15- 10/24/15 HDR Treatment, 09/02/15-10/08/15 50 Gy pelvic radiation; Dr. 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  Alendronate  was sent in, however patient did not receive it.  Could be secondary to older age, as well as cancer treatment years ago.  Also, vitamin D  levels at low normal at 34.  Given osteoporosis confirmed, will also supplement with vitamin D .  Will patient is agreeable to starting alendronate , and have expla   Other fatigue    Other specified disorders of thyroid     PMB (postmenopausal bleeding)    Restless leg syndrome 06/01/2016   Shortness of breath on exertion    Wears dentures    upper   Wears glasses     Current Outpatient Medications  on File Prior to Visit  Medication Sig Dispense Refill   allopurinol  (ZYLOPRIM ) 100 MG tablet Take 1 tablet (100 mg total) by mouth daily. (Patient not taking: Reported on 09/01/2023) 90 tablet 0   amLODipine  (NORVASC ) 5 MG tablet Take 1 tablet (5 mg total) by mouth daily. 30 tablet 11   diphenhydrAMINE  (BENADRYL  ALLERGY ) 25 mg capsule Take 1 capsule (25 mg total) by mouth every 6 (six) hours as needed.     EPINEPHrine  0.3 mg/0.3 mL IJ SOAJ injection Inject 0.3 mg  into the muscle as needed for anaphylaxis. 1 each 1   ezetimibe  (ZETIA ) 10 MG tablet Take 1 tablet by mouth once daily 90 tablet 0   levothyroxine  (SYNTHROID ) 125 MCG tablet Take 1 tablet (125 mcg total) by mouth daily. 90 tablet 3   metoprolol  succinate (TOPROL -XL) 50 MG 24 hr tablet TAKE 3 TABLETS BY MOUTH ONCE DAILY WITH MEALS 270 tablet 3   Multiple Vitamins-Minerals (CENTRUM PO) Take by mouth.     omeprazole  (PRILOSEC) 40 MG capsule Take 1 capsule (40 mg total) by mouth 2 (two) times daily. 90 capsule 1   rosuvastatin  (CRESTOR ) 40 MG tablet Take 1 tablet by mouth once daily 90 tablet 1   spironolactone  (ALDACTONE ) 25 MG tablet Take 3 tablets (75 mg total) by mouth daily. 270 tablet 0   [DISCONTINUED] esomeprazole  (NEXIUM ) 20 MG capsule Take 2 capsules (40 mg total) by mouth daily before breakfast. 30 capsule 3   No current facility-administered medications on file prior to visit.    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, Financial Controller  Tobacco Use   Smoking status: Former    Current packs/day: 0.00    Types: Cigarettes    Start date: 02/29/1992    Quit date: 02/28/1994    Years since quitting: 29.7    Passive exposure: Past   Smokeless tobacco: Never  Vaping Use   Vaping status: Never Used  Substance and Sexual Activity   Alcohol use: No    Alcohol/week: 0.0 standard drinks of alcohol    Drug use: No   Sexual activity: Not Currently    Birth control/protection: Post-menopausal  Other Topics Concern   Not on file  Social History Narrative   Current Social History 11/11/2020        Patient lives alone in an apartment which is 1 story. There are not steps up to the entrance the patient uses.       Patient's method of transportation is personal car.      The highest level of education was high school diploma.      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  Identified important Relationships are my kids       Pets : poodle       Interests / Fun: I love cooking      Current Stressors: none       Religious / Personal Beliefs: Holiness    Social Drivers of Health   Financial Resource Strain: Medium Risk (01/29/2023)   Overall Financial Resource Strain (CARDIA)    Difficulty of Paying Living Expenses: Somewhat hard  Food Insecurity: Food Insecurity Present (08/21/2023)   Hunger Vital Sign    Worried About Running Out of Food in the Last Year: Sometimes true    Ran Out of Food in the Last Year: Never true  Transportation Needs: Unmet Transportation Needs (08/21/2023)   PRAPARE - Administrator, Civil Service (Medical): Yes    Lack of Transportation (Non-Medical): No  Physical Activity: Inactive (11/04/2022)   Exercise Vital Sign    Days of Exercise per Week: 0 days    Minutes of Exercise per Session: 0 min  Stress: Stress Concern Present (11/04/2022)   [employer] of Occupational Health - Occupational Stress Questionnaire    Feeling of Stress : To some extent  Social Connections: Moderately Isolated (08/21/2023)   Social Connection and Isolation Panel    Frequency of Communication with Friends and Family: Three times a week    Frequency of Social Gatherings with Friends and Family: Never    Attends Religious Services: More than 4 times per year    Active Member of Clubs or Organizations: No    Attends Occupational Hygienist Meetings: Never    Marital Status: Widowed  Intimate Partner Violence: Not At Risk (08/21/2023)   Humiliation, Afraid, Rape, and Kick questionnaire    Fear of Current or Ex-Partner: No    Emotionally Abused: No    Physically Abused: No    Sexually Abused: No    Review of Systems: ROS negative except for what is noted on the assessment and plan.  Vitals:   11/08/23 0842  BP: (!) 144/60  Pulse: 64  Temp: 97.7 F (36.5 C)  TempSrc: Oral  SpO2: 100%  Weight: 179 lb 6.4 oz (81.4 kg)  Height: 5' (1.524 m)    Physical Exam: Constitutional: well-appearing female  in no acute distress Cardiovascular: regular rate and rhythm, no m/r/g Pulmonary/Chest: normal work of breathing on room air, lungs clear to auscultation bilaterally Abdominal: soft, non-tender, non-distended MSK: normal bulk and tone, swelling of bilateral knees, with tenderness to palpation. No erythema present bilateral. Difficulty with range of motion, limited by pain passively and actively    Assessment & Plan:   Primary osteoarthritis of knees, bilateral Chronic severe osteoarthritis confirmed by x-ray, significantly impairing mobility and work capability. Previous steroid injections provided relief. Financial issues delaying orthopedic consultation, which she states that she will straighten out. Did offer to refer to a separate office however she is not interested at the moment. Referral is placed for physical therapy, attempted to call in clinic but no answer, provided her phone number. - Provide orthopedic office contact for Dr. Yvone appointment. - Offer in-office steroid injections if orthopedic appointment delayed. - Initiate short course of naproxen , discussed reflux exacerbation and long-term risks. - Prescribe Voltaren  gel for topical pain relief.  Essential hypertension Blood pressure elevated at 144/60 mmHg, previously 130-140 mmHg systolic. Managed with spironolactone , amlodipine , and  metoprolol .  G E R D Intermittent symptoms improved with lifestyle changes. Managed with omeprazole . Discussed potential exacerbation with certain pain medications.  Has not followed up with GI yet, I provided her the phone number.  Her symptoms are improving with her omeprazole  40 mg twice a day.  At risk for Metabolic dysfunction-associated steatotic liver disease (MASLD) Fib-4 2.8, placing her at intermediate risk.  She did have a CT abdomen in November 2024 which showed mild hepatic steatosis, however did not appear on her CT in June 2025.  Will closely follow this up at her next appointment, as she needs to elastography.    Patient discussed with Dr. Trudy Dirks Loralei Radcliffe, M.D. [facility] Internal Medicine, PGY-3 Pager: [phone] Date 11/08/2023 Time 10:04 AM

## 2023-11-08 NOTE — Assessment & Plan Note (Signed)
 Fib-4 2.8, placing her at intermediate risk.  She did have a CT abdomen in November 2024 which showed mild hepatic steatosis, however did not appear on her CT in June 2025.  Will closely follow this up at her next appointment, as she needs to elastography.

## 2023-11-08 NOTE — Patient Instructions (Addendum)
 Thank you so much for coming to the clinic today!   I am sending in a short course of pain medications which should help, I am also sending in some gel that should help as well.   The phone number for the orthopedic doctor is: [phone]  The phone number for physical therapy is [phone]  The phone number for the stomach doctor is [phone]  If you have any questions please feel free to the call the clinic at anytime at [phone]. It was a pleasure seeing you!  Best, Dr. Waldine Zenz

## 2023-11-17 NOTE — Progress Notes (Signed)
 Internal Medicine Clinic Attending  Case discussed with the resident at the time of the visit.  We reviewed the resident's history and exam and pertinent patient test results.  I agree with the assessment, diagnosis, and plan of care documented in the resident's note.

## 2023-12-04 ENCOUNTER — Other Ambulatory Visit: Payer: Self-pay | Admitting: Cardiovascular Disease

## 2023-12-06 ENCOUNTER — Ambulatory Visit: Payer: Self-pay

## 2023-12-06 NOTE — Telephone Encounter (Signed)
 FYI Only or Action Required?: FYI only for provider: appointment scheduled on 12/07/23.  Patient was last seen in primary care on 11/08/2023 by Nooruddin, Saad, MD.  Called Nurse Triage reporting Extremity Pain.  Symptoms began several years ago.  Interventions attempted: Nothing.  Symptoms are: gradually worsening.  Triage Disposition: See HCP Within 4 Hours (Or PCP Triage)  Patient/caregiver understands and will follow disposition?: YesCopied from Reason for Disposition . [1] SEVERE pain (e.g., excruciating, unable to do any normal activities) AND [2] not improved 2 hours after pain medicine  Answer Assessment - Initial Assessment Questions 1. ONSET: When did the muscle aches or body pains start?      Pt states she's had arthritis for years, worsened over the last 2-3 years, current pain has prevented her from sleeping the last 4 days  2. LOCATION: What part of your body is hurting? (e.g., entire body, arms, legs)      Arms and legs   3. SEVERITY: How bad is the pain? (Scale 1-10; or mild, moderate, severe)     10/10 currently  4. CAUSE: What do you think is causing the pains?     Arthritis   5. FEVER: Do you have a fever? If Yes, ask: What is your temperature, how was it measured, and  when did it start?      No  6. OTHER SYMPTOMS: Do you have any other symptoms? (e.g., chest pain, cold or flu symptoms, rash, weakness, weight loss)     Sneezing   7. PREGNANCY: Is there any chance you are pregnant? When was your last menstrual period?     NA  8. TRAVEL: Have you traveled out of the country in the last month? (e.g., exposures, travel history)     No  Protocols used: Muscle Aches and Body Pain-A-AH  CRM [id number]. Topic: Clinical - Red Word Triage >> Dec 06, 2023  2:40 PM Mercer PEDLAR wrote: Red Word that prompted transfer to Nurse Triage: sleeping problems, has not slept in four days due to pain in legs and arms.

## 2023-12-07 ENCOUNTER — Ambulatory Visit: Payer: Self-pay

## 2023-12-14 ENCOUNTER — Ambulatory Visit

## 2023-12-15 ENCOUNTER — Encounter: Payer: Self-pay | Admitting: Physician Assistant

## 2023-12-15 ENCOUNTER — Ambulatory Visit: Admitting: Dermatology

## 2023-12-15 ENCOUNTER — Ambulatory Visit: Admitting: Physician Assistant

## 2023-12-15 VITALS — BP 167/77 | HR 75

## 2023-12-15 DIAGNOSIS — D485 Neoplasm of uncertain behavior of skin: Secondary | ICD-10-CM

## 2023-12-15 NOTE — Progress Notes (Signed)
   New Patient Visit   Subjective  Lydia Walsh is a 68 y.o. female NEW PATIENT who presents for the following: Knot of left forehead x ~1 year. It has been getting larger. Denies any other lumps or bumps on forearms, trunk or legs.     The following portions of the chart were reviewed this encounter and updated as appropriate: medications, allergies, medical history  Review of Systems:  No other skin or systemic complaints except as noted in HPI or Assessment and Plan.  Objective  Well appearing patient in no apparent distress; mood and affect are within normal limits.   A focused examination was performed of the following areas: Forehead   Relevant exam findings are noted in the Assessment and Plan.  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 We will refer patient to Dr Montorfano at [facility] Plastic Surgery Related Procedures Ambulatory referral to Plastic Surgery  Return for We are referring to [facility] Plastics .  I, Roseline Hutchinson, CMA, am acting as scribe for Zniya Cottone K, PA-C .   Documentation: I have reviewed the above documentation for accuracy and completeness, and I agree with the above.  Negin Hegg K, PA-C

## 2023-12-15 NOTE — Patient Instructions (Signed)

## 2023-12-15 NOTE — Progress Notes (Deleted)
 SEE NOTE BY DR. PACI DATED TODAY.

## 2023-12-15 NOTE — Progress Notes (Unsigned)
   New Patient Visit   Subjective  Lydia Walsh is a 68 y.o. female who presents for the following: New patient with l;ump of left forehead x 1 year that has been getting larger.    The following portions of the chart were reviewed this encounter and updated as appropriate: medications, allergies, medical history  Review of Systems:  No other skin or systemic complaints except as noted in HPI or Assessment and Plan.  Objective  Well appearing patient in no apparent distress; mood and affect are within normal limits.   A focused examination was performed of the following areas: Face   Relevant exam findings are noted in the Assessment and Plan.  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 Advised patient we will refer her to Dr Montorfano at [facility] Plastic Surgery Related Procedures Ambulatory referral to Plastic Surgery  Return if symptoms worsen or fail to improve.  I, Roseline Hutchinson, CMA, am acting as scribe for RUFUS CHRISTELLA HOLY, MD .   Documentation: I have reviewed the above documentation for accuracy and completeness, and I agree with the above.  RUFUS CHRISTELLA HOLY, MD

## 2023-12-29 ENCOUNTER — Ambulatory Visit: Payer: Self-pay

## 2023-12-29 NOTE — Telephone Encounter (Signed)
 FYI Only or Action Required?: FYI only for provider: appointment scheduled on 12/30/23. 0815  Patient was last seen in primary care on 11/08/2023 by Nooruddin, Saad, MD.  Called Nurse Triage reporting Leg Pain (Arthritis).  Symptoms began several years ago.  Interventions attempted: Nothing.  Symptoms are: unchanged.  Triage Disposition: See PCP When Office is Open (Within 3 Days)  Patient/caregiver understands and will follow disposition?: Yes  Reason for Disposition  [1] MODERATE pain (e.g., interferes with normal activities, limping) AND [2] present > 3 days  Answer Assessment - Initial Assessment Questions 1. ONSET: When did the pain start?      I been like that forever per patient, pt reports arthritis 2. LOCATION: Where is the pain located?      Both legs 3. PAIN: How bad is the pain?    (Scale 1-10; or mild, moderate, severe)     10/10, heavy pain 4. WORK OR EXERCISE: Has there been any recent work or exercise that involved this part of the body?      Denies 5. CAUSE: What do you think is causing the leg pain?     Arthritis 6. OTHER SYMPTOMS: Do you have any other symptoms? (e.g., chest pain, back pain, breathing difficulty, swelling, rash, fever, numbness, weakness)     Denies   Hx arthritis  Protocols used: Leg Pain-A-AH

## 2023-12-30 ENCOUNTER — Ambulatory Visit: Payer: Self-pay

## 2024-01-03 ENCOUNTER — Ambulatory Visit

## 2024-01-03 ENCOUNTER — Telehealth: Payer: Self-pay | Admitting: Cardiovascular Disease

## 2024-01-03 VITALS — BP 156/74 | HR 97 | Temp 97.9°F | Ht 60.0 in | Wt 180.8 lb

## 2024-01-03 DIAGNOSIS — Z8249 Family history of ischemic heart disease and other diseases of the circulatory system: Secondary | ICD-10-CM | POA: Diagnosis not present

## 2024-01-03 DIAGNOSIS — M170 Bilateral primary osteoarthritis of knee: Secondary | ICD-10-CM | POA: Diagnosis not present

## 2024-01-03 DIAGNOSIS — Z79899 Other long term (current) drug therapy: Secondary | ICD-10-CM | POA: Diagnosis not present

## 2024-01-03 DIAGNOSIS — E785 Hyperlipidemia, unspecified: Secondary | ICD-10-CM | POA: Diagnosis not present

## 2024-01-03 DIAGNOSIS — Z87891 Personal history of nicotine dependence: Secondary | ICD-10-CM | POA: Diagnosis not present

## 2024-01-03 DIAGNOSIS — I10 Essential (primary) hypertension: Secondary | ICD-10-CM | POA: Diagnosis not present

## 2024-01-03 MED ORDER — DICLOFENAC SODIUM 1 % EX GEL: 4.0000 g | Freq: Four times a day (QID) | CUTANEOUS | 2 refills | Status: AC

## 2024-01-03 MED ORDER — EZETIMIBE 10 MG PO TABS: 10.0000 mg | ORAL_TABLET | Freq: Every day | ORAL | 3 refills | Status: AC

## 2024-01-03 MED ORDER — METOPROLOL SUCCINATE ER 50 MG PO TB24: ORAL_TABLET | ORAL | 3 refills | Status: AC

## 2024-01-03 MED ORDER — LIDOCAINE 5 % EX PTCH: 1.0000 | MEDICATED_PATCH | CUTANEOUS | 3 refills | Status: AC

## 2024-01-03 MED ORDER — ROSUVASTATIN CALCIUM 40 MG PO TABS: 40.0000 mg | ORAL_TABLET | Freq: Every day | ORAL | 3 refills | Status: AC

## 2024-01-03 NOTE — Assessment & Plan Note (Addendum)
 Patient has a history of severe bilateral osteoarthritis which has been confirmed by x-ray.  Right greater than left.  She states that she previously has received a cortisone injection in the past which helped, though she has not received one in many years.  She was previously referred to physical therapy for her knees, though did not schedule this.  She is requesting a referral to orthopedic surgery today, it appears she was previously given the contact for Dr. Donnal office.  I will send in diclofenac  gel and lidocaine  patches for pain relief.  Discussed that she may use Tylenol  arthritis 650 mg 2-3 times a day, and discussed use of ice or heat.  We will avoid oral NSAIDs due to her history of severe GERD.  We will place a orthopedic referral today.  We did discuss that if the wait for an appointment with orthopedics is too long, then she may call the [facility] to schedule bilateral cortisone injections when Dr. Rosan, Dr. Trudy, or Dr. Jeanelle are in the [facility] precepting. - Referral to Orthopedics - Voltaren  gel, lidocaine  patches, Tylenol  Arthritis 650 mg TID, ice/heat - PT referral

## 2024-01-03 NOTE — Telephone Encounter (Signed)
Left message to call back to discuss her concerns

## 2024-01-03 NOTE — Assessment & Plan Note (Signed)
 Blood pressure elevated today in the office 180/86 and 156/74 on repeat.  She reports that she did not take any of her medications prior to attending this morning.  She is currently managed with spironolactone  75 mg daily, amlodipine  5 mg daily, and metoprolol  150 mg daily.  She states that she is completely out of her metoprolol , and I stated that I would refill this today.  I would like for her to follow-up in 1 month for her routine appointment and blood pressure recheck. - Refilled Metoprolol  150 mg daily - Continue Spironolactone  75 mg daily - Continue Amlodipine  5 mg daily  - f/u in 1 month for chronic conditions including BP recheck

## 2024-01-03 NOTE — Patient Instructions (Addendum)
 Thank you, [name] for allowing us  to provide your care today. Today we discussed the following:  - For your knees, I recommend using Voltaren  gel, lidocaine  patch, ice or heat as needed, you may take Tylenol  650 mg (Arthritis) 2-3 times a day.  - I am referring you to the knee specialist Orthopedic doctors, but if the wait is long and you need to see us  again for your knees, you may call to schedule an appointment with us  for a cortisone injection when Dr. Rosan, Dr. Jeanelle, or Dr. Trudy are in the Internal Medicine clinic precepting.  - I would like for you to begin Physical Therapy and have placed a referral to them as well    Referrals ordered today:   Referral Orders         Ambulatory referral to Orthopedic Surgery         Ambulatory referral to Physical Therapy       I have ordered the following medication/changed the following medications:   Start the following medications: Meds ordered this encounter  Medications   metoprolol  succinate (TOPROL -XL) 50 MG 24 hr tablet    Sig: TAKE 3 TABLETS BY MOUTH ONCE DAILY WITH MEALS    Dispense:  270 tablet    Refill:  3    Please fill early due to accidental loss of tablets.   diclofenac  Sodium (VOLTAREN ) 1 % GEL    Sig: Apply 4 g topically 4 (four) times daily.    Dispense:  300 each    Refill:  2   ezetimibe  (ZETIA ) 10 MG tablet    Sig: Take 1 tablet (10 mg total) by mouth daily.    Dispense:  90 tablet    Refill:  3    Pt must schedule an overdue followup appt with Cardiology for any more refills. [phone] 1st attempt Thank You   rosuvastatin  (CRESTOR ) 40 MG tablet    Sig: Take 1 tablet (40 mg total) by mouth daily.    Dispense:  90 tablet    Refill:  3   lidocaine  (LIDODERM ) 5 %    Sig: Place 1 patch onto the skin daily. Remove & Discard patch within 12 hours    Dispense:  10 patch    Refill:  3     Follow up: 1 month for routine visit    Remember: Please call your Heart doctor, Dr. Court to  schedule a follow up visit. If your knees are not feeling better by the beginning of January, and you won't see the Orthopedic doctors for a while, call our office to schedule an appointment when an attending is present who performs cortisone injections in our office.    Hope you have a Happy Holidays!   Should you have any questions or concerns please call the Internal Medicine Clinic at [phone].     Doyal Miyamoto, MD [facility]

## 2024-01-03 NOTE — Assessment & Plan Note (Signed)
 Patient has a history of dyslipidemia, with last LDL on 05/21/2022 showing total cholesterol 136, LDL 62, and HDL 59.  She is managed with rosuvastatin  40 mg daily and ezetimibe  10 mg daily.  She is out of both of these medications and is requesting a refill which I have sent a 1 year supply of.  She is supposed to follow-up with her cardiologist Dr. Court, which I have encouraged her to do so and reminded her to schedule a follow-up with them.  Today she presents for an acute visit regarding her bilateral knees, though I have asked her to return in 1 month for her chronic conditions and I would recommend obtaining repeat lipid panel at that time. - Continue Rosuvastatin  40 mg daily - Continue Zetia  10 mg daily - Patient reminded to schedule f/u with Cardiology Dr. Court - f/u in 1 month in [facility] for chronic conditions; recommend obtaining repeat lipid panel at that time

## 2024-01-03 NOTE — Progress Notes (Signed)
 "  Patient name: Lydia Walsh Date of birth: [birth year] Date of visit: 01/03/2024  Type of visit: Acute Office Visit   Subjective   Chief concern:  Chief Complaint  Patient presents with   Leg Swelling    Bil leg swelling   Medication Refill    MAANASA ADERHOLD is a 68 y.o. female with a history of HTN, GERD, HLD, OSA, 1st deg AV block, MASLD, and OA, who presents to [facility] clinic for evaluation of bilateral knees.  Patient has a history of bilateral knee osteoarthritis, and states that she previously has received right knee cortisone injections which helped, though it has been many years since she received one.  She has been seen in the [facility] in 09/01/2023 and 11/01/2023 regarding her knees, and at that time physical therapy referral was discussed as well as possible cortisone injection versus follow-up with orthopedic surgery.  Today she is requesting a referral to orthopedics.  Blood pressure in the office noted to be a little elevated 180/86, and 156/74 on repeat.  She reports that she did not take any of her blood pressure medications today.  She does note that she is completely out of her metoprolol , and is requesting a refill of this.  She is also requesting a refill of rosuvastatin  and Zetia .  She reports that she is supposed to follow-up with her cardiologist, though has not scheduled this appointment yet.  ROS: Denies headaches, dizziness, fever, chills, runny nose, sore throat, vision changes, hearing changes, chest pain, shortness of breath, difficulty breathing, nausea, vomiting, abdominal pain. Denies increased urinary frequency, pain with urination, constipation or diarrhea. No recent falls.   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   Melasma 10/01/2023   Lipoma of forehead 07/15/2023   Diarrhea with dehydration 07/15/2023   Osteoporosis  02/23/2022   Laryngopharyngeal reflux (LPR) 10/05/2019   Primary osteoarthritis of knees, bilateral 11/18/2016   AV block, 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   Renal mass, left 06/21/2015   Obesity (BMI 30-39.9) 06/21/2015   Osteoarthritis    GOUT 11/24/2007   G E R D 02/19/2006   Hypothyroidism 02/17/2006   Dyslipidemia 02/17/2006   Essential hypertension 02/17/2006   OBSTRUCTIVE SLEEP APNEA 06/21/2005     Past Surgical History:  Procedure Laterality Date   ABDOMINAL HYSTERECTOMY     BREAST EXCISIONAL BIOPSY Right    benign   CHOLECYSTECTOMY     CHOLECYSTECTOMY, LAPAROSCOPIC  2020   COLONOSCOPY     DILATATION & CURETTAGE/HYSTEROSCOPY WITH MYOSURE N/A 03/07/2015   Procedure: DILATATION & CURETTAGE/HYSTEROSCOPY WITH MYOSURE (POLYP);  Surgeon: Norleen Skill, MD;  Location: [facility];  Service: Gynecology;  Laterality: N/A;   DILATATION & CURRETTAGE/HYSTEROSCOPY WITH RESECTOCOPE  12-13-2002  &  09-26-2004   polyp/  submucosal fibroid   ESOPHAGOGASTRODUODENOSCOPY     EXCISION BENIGN RIGHT BREAST MASS  age 10   IR REMOVAL TUN ACCESS W/ PORT W/O FL MOD SED  07/17/2016   KNEE ARTHROSCOPY W/ MENISCECTOMY Bilateral right 04-12-2006//  left 12-29-2006   and Chondroplasty   port placement     ROBOTIC ASSISTED TOTAL HYSTERECTOMY WITH BILATERAL SALPINGO OOPHERECTOMY Bilateral 05/02/2015   Procedure: XI ROBOTIC ASSISTED TOTAL HYSTERECTOMY WITH BILATERAL SALPINGO OOPHORECTOMY AND SENTINAL LYMPH NODE BIOPSY;  Surgeon: Sari Bachelor, MD;  Location: [facility];  Service: Gynecology;  Laterality: Bilateral;   TRANSTHORACIC ECHOCARDIOGRAM  04-02-2010   normal  LV, ef 65%   TUBAL LIGATION  1980's     Current Outpatient Medications  Medication Instructions   allopurinol  (ZYLOPRIM ) 100 mg, Oral, Daily   amLODipine  (NORVASC ) 5 mg, Oral, Daily   diclofenac  Sodium (VOLTAREN ) 4 g, Topical, 4 times daily   diphenhydrAMINE  (BENADRYL  ALLERGY ) 25 mg, Oral, Every 6 hours PRN   EPINEPHrine  (EPI-PEN) 0.3 mg,  Intramuscular, As needed   ezetimibe  (ZETIA ) 10 mg, Oral, Daily   levothyroxine  (SYNTHROID ) 125 mcg, Oral, Daily   lidocaine  (LIDODERM ) 5 % 1 patch, Transdermal, Every 24 hours, Remove & Discard patch within 12 hours   metoprolol  succinate (TOPROL -XL) 50 MG 24 hr tablet TAKE 3 TABLETS BY MOUTH ONCE DAILY WITH MEALS   Multiple Vitamins-Minerals (CENTRUM PO) Take by mouth.   omeprazole  (PRILOSEC) 40 mg, Oral, 2 times daily   rosuvastatin  (CRESTOR ) 40 mg, Oral, Daily   spironolactone  (ALDACTONE ) 75 mg, Oral, Daily    Social History[1]    Objective  Today's Vitals   01/03/24 0814 01/03/24 0827  BP: (!) 180/86 (!) 156/74  Pulse: (!) 101 97  Temp: 97.9 F (36.6 C)   TempSrc: Oral   SpO2: 100%   Weight: 180 lb 12.8 oz (82 kg)   Height: 5' (1.524 m)   Body mass index is 35.31 kg/m.   Physical Exam:   Constitutional: well-appearing female sitting in exam chair, in no acute distress.  HEENT: normocephalic atraumatic, mucous membranes moist Cardiovascular: regular rate and rhythm, bilateral radial pulses 2+, bilateral dorsal pedal pulses 2+, brisk capillary refill bilateral feet and hands  Pulmonary/Chest: normal work of breathing on room air, lungs clear to auscultation bilaterally Abdominal: soft, non-tender, non-distended MSK: normal bulk and tone. Right knee swelling > L; tenderness to palpation, limited ROM due to pain; no warmth or erythema; negative Homan's  Neurological: alert & oriented x 3 Skin: warm and dry Psych: mood calm, behavior normal, thought content normal, judgement normal      The 10-year ASCVD risk score (Arnett DK, et al., 2019) is: 12.3%   Values used to calculate the score:     Age: 12 years     Clinically relevant sex: Female     Is Non-Hispanic African American: Yes     Diabetic: No     Tobacco smoker: No     Systolic Blood Pressure: 156 mmHg     Is BP treated: Yes     HDL Cholesterol: 59 mg/dL     Total Cholesterol: 136 mg/dL      Assessment &  Plan  Problem List Items Addressed This Visit     Dyslipidemia (Chronic)   Patient has a history of dyslipidemia, with last LDL on 05/21/2022 showing total cholesterol 136, LDL 62, and HDL 59.  She is managed with rosuvastatin  40 mg daily and ezetimibe  10 mg daily.  She is out of both of these medications and is requesting a refill which I have sent a 1 year supply of.  She is supposed to follow-up with her cardiologist Dr. Court, which I have encouraged her to do so and reminded her to schedule a follow-up with them.  Today she presents for an acute visit regarding her bilateral knees, though I have asked her to return in 1 month for her chronic conditions and I would recommend obtaining repeat lipid panel at that time. - Continue Rosuvastatin  40 mg daily - Continue Zetia  10 mg daily - Patient reminded to schedule f/u with Cardiology Dr. Court - f/u in 1 month in  [facility] for chronic conditions; recommend obtaining repeat lipid panel at that time       Relevant Medications   ezetimibe  (ZETIA ) 10 MG tablet   rosuvastatin  (CRESTOR ) 40 MG tablet   Essential hypertension - Primary (Chronic)   Blood pressure elevated today in the office 180/86 and 156/74 on repeat.  She reports that she did not take any of her medications prior to attending this morning.  She is currently managed with spironolactone  75 mg daily, amlodipine  5 mg daily, and metoprolol  150 mg daily.  She states that she is completely out of her metoprolol , and I stated that I would refill this today.  I would like for her to follow-up in 1 month for her routine appointment and blood pressure recheck. - Refilled Metoprolol  150 mg daily - Continue Spironolactone  75 mg daily - Continue Amlodipine  5 mg daily  - f/u in 1 month for chronic conditions including BP recheck       Relevant Medications   metoprolol  succinate (TOPROL -XL) 50 MG 24 hr tablet   ezetimibe  (ZETIA ) 10 MG tablet   rosuvastatin  (CRESTOR ) 40 MG tablet   Primary  osteoarthritis of knees, bilateral (Chronic)   Patient has a history of severe bilateral osteoarthritis which has been confirmed by x-ray.  Right greater than left.  She states that she previously has received a cortisone injection in the past which helped, though she has not received one in many years.  She was previously referred to physical therapy for her knees, though did not schedule this.  She is requesting a referral to orthopedic surgery today, it appears she was previously given the contact for Dr. Donnal office.  I will send in diclofenac  gel and lidocaine  patches for pain relief.  Discussed that she may use Tylenol  arthritis 650 mg 2-3 times a day, and discussed use of ice or heat.  We will avoid oral NSAIDs due to her history of severe GERD.  We will place a orthopedic referral today.  We did discuss that if the wait for an appointment with orthopedics is too long, then she may call the [facility] to schedule bilateral cortisone injections when Dr. Rosan, Dr. Trudy, or Dr. Jeanelle are in the [facility] precepting. - Referral to Orthopedics - Voltaren  gel, lidocaine  patches, Tylenol  Arthritis 650 mg TID, ice/heat - PT referral       Osteoarthritis   Relevant Medications   diclofenac  Sodium (VOLTAREN ) 1 % GEL   lidocaine  (LIDODERM ) 5 %   Other Relevant Orders   Ambulatory referral to Orthopedic Surgery   Ambulatory referral to Physical Therapy    Return in about 1 month (around 02/03/2024) for BP recheck, routine .  Patient discussed with Dr. Lovie, who also saw and evaluated the patient.  Doyal Miyamoto, MD  IM  PGY-1 01/03/2024, 2:58 PM       [1]  Social History Tobacco Use   Smoking status: Former    Current packs/day: 0.00    Types: Cigarettes    Start date: 02/29/1992    Quit date: 02/28/1994    Years since quitting: 29.8    Passive exposure: Past   Smokeless tobacco: Never  Vaping Use   Vaping status: Never Used  Substance Use Topics   Alcohol use: No     Alcohol/week: 0.0 standard drinks of alcohol   Drug use: No   "

## 2024-01-03 NOTE — Telephone Encounter (Signed)
 Patient c/o Palpitations: STAT if patient c/o lightheadedness, shortness of breath, or chest pain  How long have you had palpitations/irregular HR/ Afib? Are you having the symptoms now? 2-3 days  Are you currently experiencing lightheadedness, SOB or CP? no  Do you have a history of afib (atrial fibrillation) or irregular heart rhythm?  yes  Have you checked your BP or HR? (document readings if available):  150/?  Are you experiencing any other symptoms?    No  Pt requesting sooner appt.

## 2024-01-04 ENCOUNTER — Ambulatory Visit: Payer: Self-pay

## 2024-01-04 NOTE — Progress Notes (Signed)
Internal Medicine Clinic Attending  I was physically present during the key portions of the resident provided service and participated in the medical decision making of patient's management care. I reviewed pertinent patient test results.  The assessment, diagnosis, and plan were formulated together and I agree with the documentation in the resident's note.  Mercie Eon, MD

## 2024-01-04 NOTE — Addendum Note (Signed)
 Addended by: Inocencio Roy L on: 01/04/2024 09:57 AM   Modules accepted: Level of Service

## 2024-01-24 ENCOUNTER — Ambulatory Visit

## 2024-01-25 ENCOUNTER — Institutional Professional Consult (permissible substitution)

## 2024-01-31 ENCOUNTER — Ambulatory Visit: Payer: Self-pay

## 2024-01-31 ENCOUNTER — Telehealth: Payer: Self-pay | Admitting: Cardiovascular Disease

## 2024-01-31 NOTE — Telephone Encounter (Signed)
 "   Reason for Triage: had shortness of breath this morning, woke up gasping for air.  Reason for Disposition  MODERATE - SEVERE insomnia (e.g., awake most of night; lack of sleep interferes with ability to function during the day; interferes with work or school)  [1] MILD longstanding difficulty breathing (e.g., minimal/no SOB at rest, SOB with walking, pulse < 100) AND [2] SAME as normal  Answer Assessment - Initial Assessment Questions Pt describes it as a grasping for air when she lays down at night, prior to falling asleep, then has to get up. She called her cardiologist who told her to call us . She asks did she have a heart attack. RN advised can't diagnose that over the phone. She currently is denying any symptoms then states can I tell you something. Rn advised can say anything. She begins to cry and states she is getting no sleep due to the pain in her legs. She states she was given lidoderm  patches and they did not help.     1. RESPIRATORY STATUS: Describe your breathing? (e.g., wheezing, shortness of breath, unable to speak, severe coughing)      Gasping for air when she lays down to sleep 2. ONSET: When did this breathing problem begin?      About a month 3. PATTERN Does the difficult breathing come and go, or has it been constant since it started?      Intermittent, has happened on 3 different occasions.  4. SEVERITY: How bad is your breathing? (e.g., mild, moderate, severe)      States she gasps and has to get up to catch her breath 5. RECURRENT SYMPTOM: Have you had difficulty breathing before? If Yes, ask: When was the last time? and What happened that time?      no 6. CARDIAC HISTORY: Do you have any history of heart disease? (e.g., heart attack, angina, bypass surgery, angioplasty)      yes 7. LUNG HISTORY: Do you have any history of lung disease?  (e.g., pulmonary embolus, asthma, emphysema)     osa 8. CAUSE: What do you think is causing the breathing  problem?      unknown 9. OTHER SYMPTOMS: Do you have any other symptoms? (e.g., chest pain, cough, dizziness, fever, runny nose)     Leg pain that causes her to not be able to sleep. insomnia  Answer Assessment - Initial Assessment Questions Pt is requesting a child psychotherapist reach out to her to assist with possible disability.    1. DESCRIPTION: Tell me about your sleeping problem. (e.g., waking frequently during night, sleeping during day and awake at night, trouble falling asleep) How bad is it?      Difficulty getting any sleep 2. ONSET: How long have you been having trouble sleeping? (e.g., days, weeks, months; longstanding sleep problems)     Ongoing but worse lately with legs 3 STRESSORS: Is there anything that is making you feel stressed? Is there something that worries you?     Leg pain, doesn't feel like she can work, but financially can't afford not to 4. PAIN: Do you have any pain that is keeping you awake? (e.g., back pain, joint pain) If Yes, ask How bad is the pain? (e.g., scale 0-10; mild, moderate, severe).   Yes 5. OTHER SYMPTOMS: Do you have any other symptoms?  (e.g., difficulty breathing)       Pt states when she lays down she feels like she is gasping for air at times.  Protocols  used: Breathing Difficulty-A-AH, Insomnia-A-AH  "

## 2024-01-31 NOTE — Telephone Encounter (Signed)
 FYI Only or Action Required?: FYI only for provider: appointment scheduled on 1.20.2026.  Patient was last seen in primary care on 01/03/2024 by Leontine Lapine, MD.  Called Nurse Triage reporting Shortness of Breath, Insomnia, and Leg Pain.  Symptoms began about a month ago.  Interventions attempted: Prescription medications: lidoderm  patches.  Symptoms are: unchanged.  Triage Disposition: See PCP Within 2 Weeks, See PCP When Office is Open (Within 3 Days)  Patient/caregiver understands and will follow disposition?: Yes

## 2024-01-31 NOTE — Telephone Encounter (Signed)
 RTC to patient.  Message left will discuss need for SW at her visit on tomorrow.

## 2024-01-31 NOTE — Telephone Encounter (Signed)
 Spoke with pt. Pt stated that last night she went to lay down and jumped back up because she felt SOB and needed to catch her breath. Pt is not getting SOB any other time but this is the third time this has happened.  She was not sleeping at the time. Asked pt if she had spoke to her PCP. Pt had not and stated she would call them. Advised pt to call us  back if she saw them and they advised seeing us . Pt stated understanding.

## 2024-01-31 NOTE — Telephone Encounter (Signed)
 Pt c/o Shortness Of Breath: STAT if SOB developed within the last 24 hours or pt is noticeably SOB on the phone  1. Are you currently SOB (can you hear that pt is SOB on the phone)? No   2. How long have you been experiencing SOB? Coming & going   3. Are you SOB when sitting or when up moving around? Only when laying down at night  and getting back up   4. Are you currently experiencing any other symptoms? No

## 2024-02-01 ENCOUNTER — Ambulatory Visit: Payer: Self-pay | Admitting: Student

## 2024-02-01 DIAGNOSIS — K219 Gastro-esophageal reflux disease without esophagitis: Secondary | ICD-10-CM | POA: Diagnosis not present

## 2024-02-01 MED ORDER — OMEPRAZOLE 40 MG PO CPDR: 40.0000 mg | DELAYED_RELEASE_CAPSULE | Freq: Every day | ORAL | 1 refills | Status: AC

## 2024-02-01 NOTE — Assessment & Plan Note (Signed)
 Patient reports history of laying down at night and experiencing shortness of breath when laying flat.  She reports that this has happened 3 times over the past 4 weeks.  She stated that it only happens when she first lays down and then she stands up lays back down and is able to breathe fine.  She denies change in amount of pillows that she is using she does not use a recliner to sleep at night.  Overall she feels quite anxious due to the feeling of shortness of breath whenever this happens.  After a long discussion, patient reported having symptoms of esophageal burning and feeling like she was having wheezing/chest tightness during these episodes.  She also reported that during the 3 times of this happen, she ate dinner within 30 minutes of going to bed.  She also reports that she is not compliant with her omeprazole , last time it was picked up from the pharmacy was in September.  Fortunately, patient denies any shortness of breath or chest pain at this time, or orthopnea.  She has no coughing or dysphagia as well.  She is able to walk her dog/do her job daily without any shortness of breath or chest pain/tightness.  Patient does have a history of laryngopharyngeal reflux.  Given her symptoms of acid reflux precipitating the feeling of shortness of breath/wheezing and chest tightness, I have a strong suspicion that patient is having acid reflux that is causing irritation to the larynx precipitating a feeling of shortness of breath.  Low suspicion for angina as she is able to tolerate activity without chest pain or shortness of breath.  Plan: -Patient reminded of the importance of compliance with omeprazole  -Reviewed lifestyle changes/modifications for acid reflux -Patient reminded the importance of having a sleep study for her OSA to evaluate other causes of her insomnia

## 2024-02-01 NOTE — Patient Instructions (Addendum)
 Thank you, Ms.Israa S Rosander for allowing us  to provide your care today. Today we discussed gasping for air when you laid down those few times. Please be sure to take Prilosec 40 mg daily! When you have the burning feeling in your throat, you can try over the counter tums. Do not eat within 2-3 hours prior to going to bed. Sleep propped up on pillows and try to exercise/ diet to lose weight.   Make sure you get your sleep study done as well!   I have ordered the following medication/changed the following medications:   Stop the following medications: Medications Discontinued During This Encounter  Medication Reason   omeprazole  (PRILOSEC) 40 MG capsule Reorder     Start the following medications: Meds ordered this encounter  Medications   omeprazole  (PRILOSEC) 40 MG capsule    Sig: Take 1 capsule (40 mg total) by mouth daily.    Dispense:  90 capsule    Refill:  1     Follow up: one month to check on symptoms of breathing and acid reflux    Remember: If you have worsening of symptoms or chest pain/shortness of breath call us .   Should you have any questions or concerns please call the internal medicine clinic at [phone].     Please note that our late policy has changed.  If you are more than 15 minutes late to your appointment, you may be asked to reschedule your appointment.  Dr. Kandis, D.O. [facility]

## 2024-02-01 NOTE — Progress Notes (Signed)
 "  Established Patient Office Visit  Subjective   Patient ID: Lydia Walsh, female    DOB: [birth year]  Age: 69 y.o. MRN: [id number]  Chief Complaint  Patient presents with   Acute Visit     Insomnia, gasping for air when laying down.    Lydia Walsh is a 69 y.o. who presents to the clinic for an acute visit for concerns of shortness of breath when she lies down at night. Please see problem based assessment and plan for additional details.    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   Melasma 10/01/2023   Lipoma of forehead 07/15/2023   Diarrhea with dehydration 07/15/2023   Osteoporosis 02/23/2022   Laryngopharyngeal reflux (LPR) 10/05/2019   Primary osteoarthritis of knees, bilateral 11/18/2016   AV block, 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   Renal mass, left 06/21/2015   Obesity (BMI 30-39.9) 06/21/2015   Osteoarthritis    GOUT 11/24/2007   G E R D 02/19/2006   Hypothyroidism 02/17/2006   Dyslipidemia 02/17/2006   Essential hypertension 02/17/2006   OBSTRUCTIVE SLEEP APNEA 06/21/2005      Objective:     BP 121/74 (BP Location: Right Arm, Patient Position: Sitting, Cuff Size: Large)   Pulse 62   Temp 98.1 F (36.7 C) (Oral)   Ht 5' (1.524 m)   Wt 181 lb 9.6 oz (82.4 kg)   LMP 07/16/2014   SpO2 95%   BMI 35.47 kg/m  BP Readings from Last 3 Encounters:  02/01/24 121/74  01/03/24 (!) 156/74  12/15/23 (!) 167/77   Wt Readings from Last 3 Encounters:  02/01/24 181 lb 9.6 oz (82.4 kg)  01/03/24 180 lb 12.8 oz (82 kg)  11/08/23 179 lb 6.4 oz (81.4 kg)      Physical Exam Vitals reviewed.  Constitutional:      General: She is not in acute distress.    Appearance: She is not ill-appearing, toxic-appearing or diaphoretic.  Cardiovascular:     Rate and Rhythm: Normal rate and regular rhythm.  Pulmonary:     Effort: Pulmonary effort  is normal. No respiratory distress.     Breath sounds: Normal breath sounds. No wheezing.  Abdominal:     General: Abdomen is protuberant.     Palpations: Abdomen is soft.     Tenderness: There is no abdominal tenderness.  Musculoskeletal:     Comments: Non pitting edema of the B/L LE  Skin:    General: Skin is warm and dry.  Psychiatric:        Mood and Affect: Mood and affect normal.    No results found for any visits on 02/01/24.  Last metabolic panel Lab Results  Component Value Date   GLUCOSE 215 (H) 08/22/2023   NA 137 08/22/2023   K 3.4 (L) 08/22/2023   CL 101 08/22/2023   CO2 23 08/22/2023   BUN 15 08/22/2023   CREATININE 0.84 08/22/2023   GFRNONAA >60 08/22/2023   CALCIUM  10.3 08/22/2023   PROT 7.0 08/22/2023   ALBUMIN 3.6 08/22/2023   LABGLOB 2.6 04/01/2021   AGRATIO 1.8 04/01/2021   BILITOT 0.7 08/22/2023   ALKPHOS 86 08/22/2023   AST 46 (H) 08/22/2023   ALT 61 (H) 08/22/2023   ANIONGAP 13 08/22/2023   Last lipids Lab Results  Component Value Date   CHOL 136 05/21/2022   HDL 59 05/21/2022   LDLCALC  62 05/21/2022   TRIG 78 05/21/2022   CHOLHDL 2.3 05/21/2022      The 10-year ASCVD risk score (Arnett DK, et al., 2019) is: 7.7%    Assessment & Plan:   Problem List Items Addressed This Visit       Digestive   G E R D (Chronic)   Patient reports history of laying down at night and experiencing shortness of breath when laying flat.  She reports that this has happened 3 times over the past 4 weeks.  She stated that it only happens when she first lays down and then she stands up lays back down and is able to breathe fine.  She denies change in amount of pillows that she is using she does not use a recliner to sleep at night.  Overall she feels quite anxious due to the feeling of shortness of breath whenever this happens.  After a long discussion, patient reported having symptoms of esophageal burning and feeling like she was having wheezing/chest  tightness during these episodes.  She also reported that during the 3 times of this happen, she ate dinner within 30 minutes of going to bed.  She also reports that she is not compliant with her omeprazole , last time it was picked up from the pharmacy was in September.  Fortunately, patient denies any shortness of breath or chest pain at this time, or orthopnea.  She has no coughing or dysphagia as well.  She is able to walk her dog/do her job daily without any shortness of breath or chest pain/tightness.  Patient does have a history of laryngopharyngeal reflux.  Given her symptoms of acid reflux precipitating the feeling of shortness of breath/wheezing and chest tightness, I have a strong suspicion that patient is having acid reflux that is causing irritation to the larynx precipitating a feeling of shortness of breath.  Low suspicion for angina as she is able to tolerate activity without chest pain or shortness of breath.  Plan: -Patient reminded of the importance of compliance with omeprazole  -Reviewed lifestyle changes/modifications for acid reflux -Patient reminded the importance of having a sleep study for her OSA to evaluate other causes of her insomnia      Relevant Medications   omeprazole  (PRILOSEC) 40 MG capsule    Return in about 4 weeks (around 02/29/2024) for SOB/ GERD sx.    Damien Lease, DO  "

## 2024-02-02 NOTE — Progress Notes (Signed)
 Internal Medicine Clinic Attending  Case discussed with the resident at the time of the visit.  We reviewed the resident's history and exam and pertinent patient test results.  I agree with the assessment, diagnosis, and plan of care documented in the resident's note.
